# Patient Record
Sex: Female | Born: 1950 | Race: White | Hispanic: No | Marital: Married | Smoking: Former smoker
Health system: Southern US, Community
[De-identification: ages and names within clinical notes are randomized; demographics above are authoritative.]

## PROBLEM LIST (undated history)

## (undated) DIAGNOSIS — Z8489 Family history of other specified conditions: Secondary | ICD-10-CM

## (undated) DIAGNOSIS — C50919 Malignant neoplasm of unspecified site of unspecified female breast: Secondary | ICD-10-CM

## (undated) DIAGNOSIS — E7800 Pure hypercholesterolemia, unspecified: Secondary | ICD-10-CM

## (undated) DIAGNOSIS — I4891 Unspecified atrial fibrillation: Secondary | ICD-10-CM

## (undated) DIAGNOSIS — Z923 Personal history of irradiation: Secondary | ICD-10-CM

## (undated) DIAGNOSIS — Z9012 Acquired absence of left breast and nipple: Secondary | ICD-10-CM

## (undated) DIAGNOSIS — R7303 Prediabetes: Secondary | ICD-10-CM

## (undated) DIAGNOSIS — I10 Essential (primary) hypertension: Secondary | ICD-10-CM

## (undated) DIAGNOSIS — J449 Chronic obstructive pulmonary disease, unspecified: Secondary | ICD-10-CM

## (undated) HISTORY — DX: Pure hypercholesterolemia, unspecified: E78.00

## (undated) HISTORY — DX: Unspecified atrial fibrillation: I48.91

## (undated) HISTORY — DX: Malignant neoplasm of unspecified site of unspecified female breast: C50.919

## (undated) HISTORY — PX: EYE SURGERY: SHX253

## (undated) HISTORY — PX: COLONOSCOPY: SHX174

## (undated) HISTORY — PX: OTHER SURGICAL HISTORY: SHX169

## (undated) HISTORY — DX: Essential (primary) hypertension: I10

## (undated) HISTORY — PX: BREAST LUMPECTOMY: SHX2

---

## 2004-07-24 ENCOUNTER — Ambulatory Visit: Payer: Self-pay | Admitting: Family Medicine

## 2005-08-11 ENCOUNTER — Ambulatory Visit: Payer: Self-pay | Admitting: Family Medicine

## 2006-08-13 ENCOUNTER — Ambulatory Visit: Payer: Self-pay | Admitting: Family Medicine

## 2006-08-13 IMAGING — MG UNKNOWN MG STUDY
1 series · 6 of 6 positions shown · non-contrast
Comparison: none

REASON FOR EXAM: screening mammo
COMMENTS:

[R CC · right · 6 of 6 slices shown]
[im 1/6]
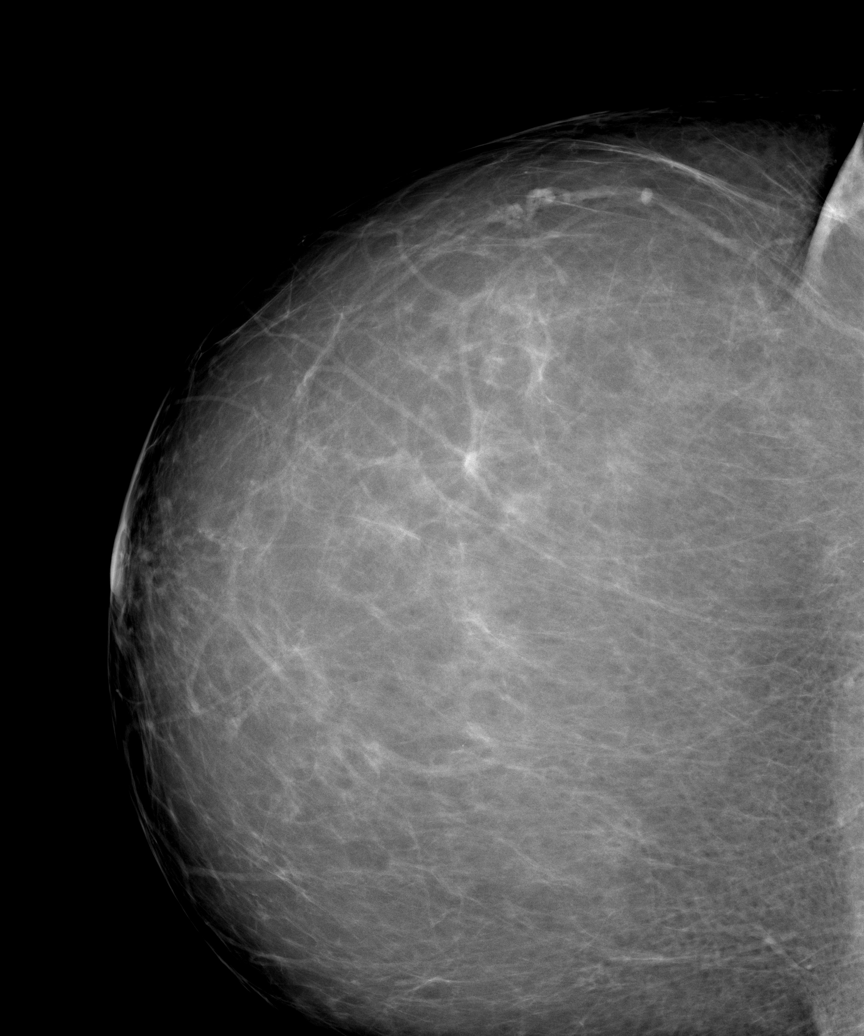
[im 2/6]
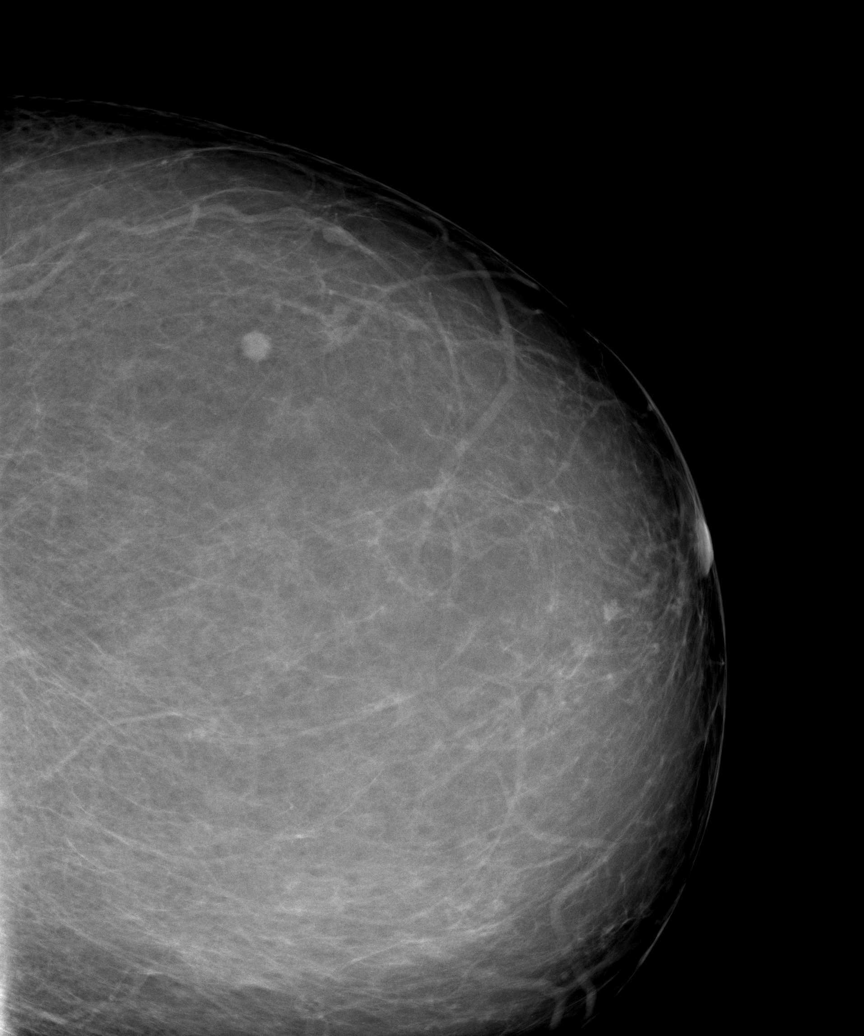
[im 3/6]
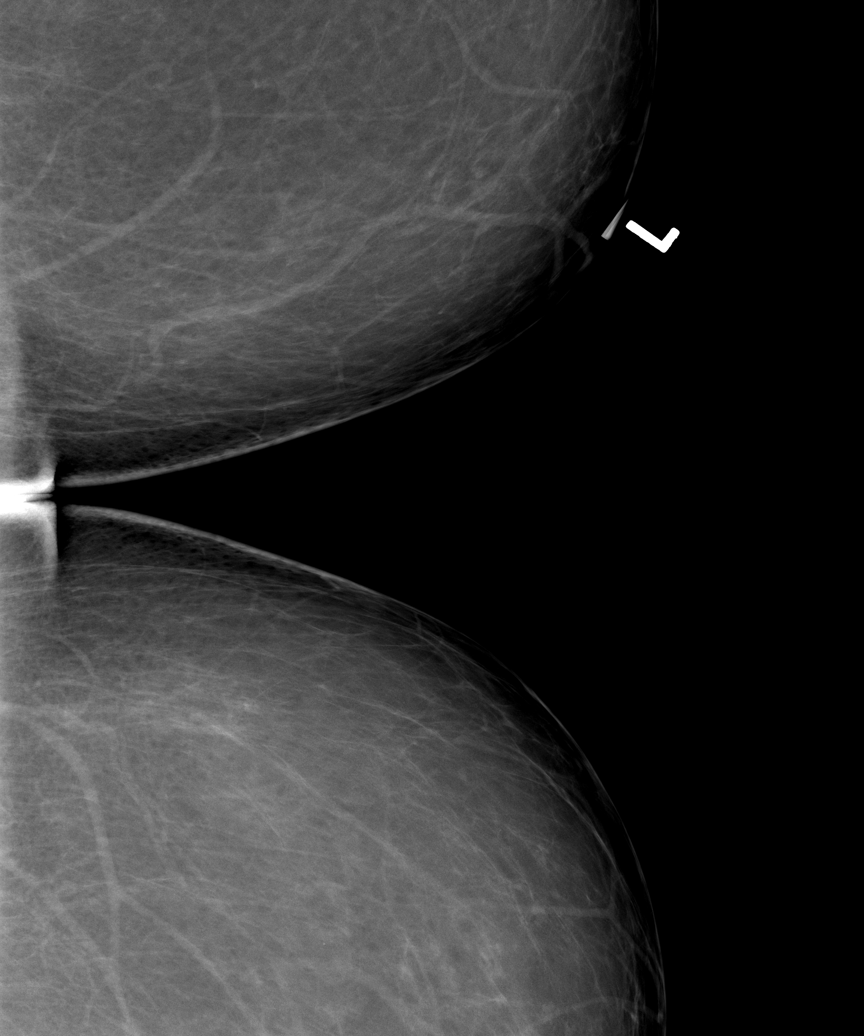
[im 4/6]
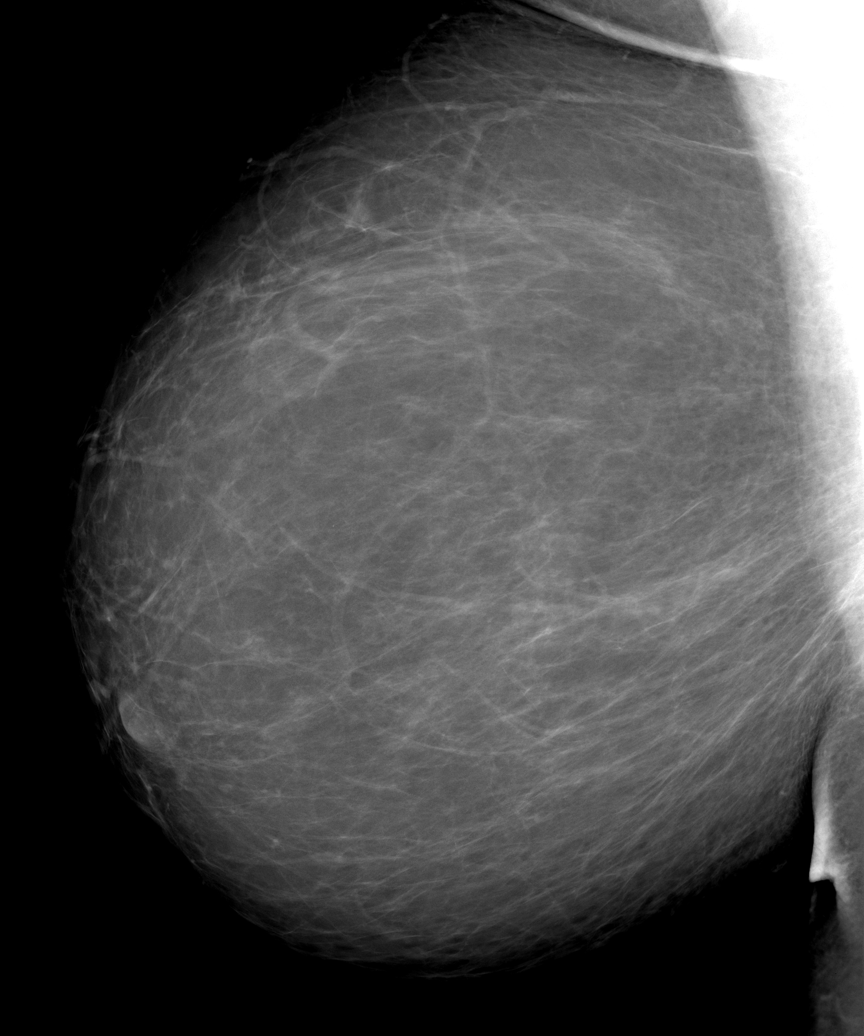
[im 5/6]
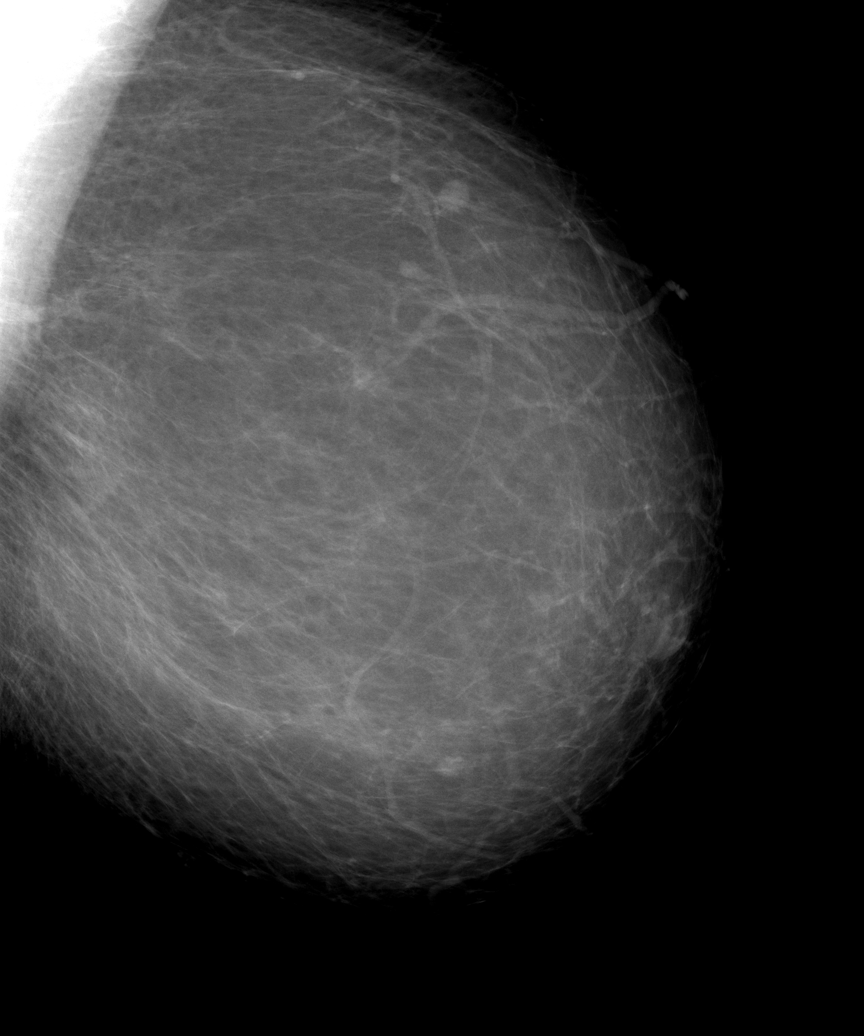
[im 6/6]
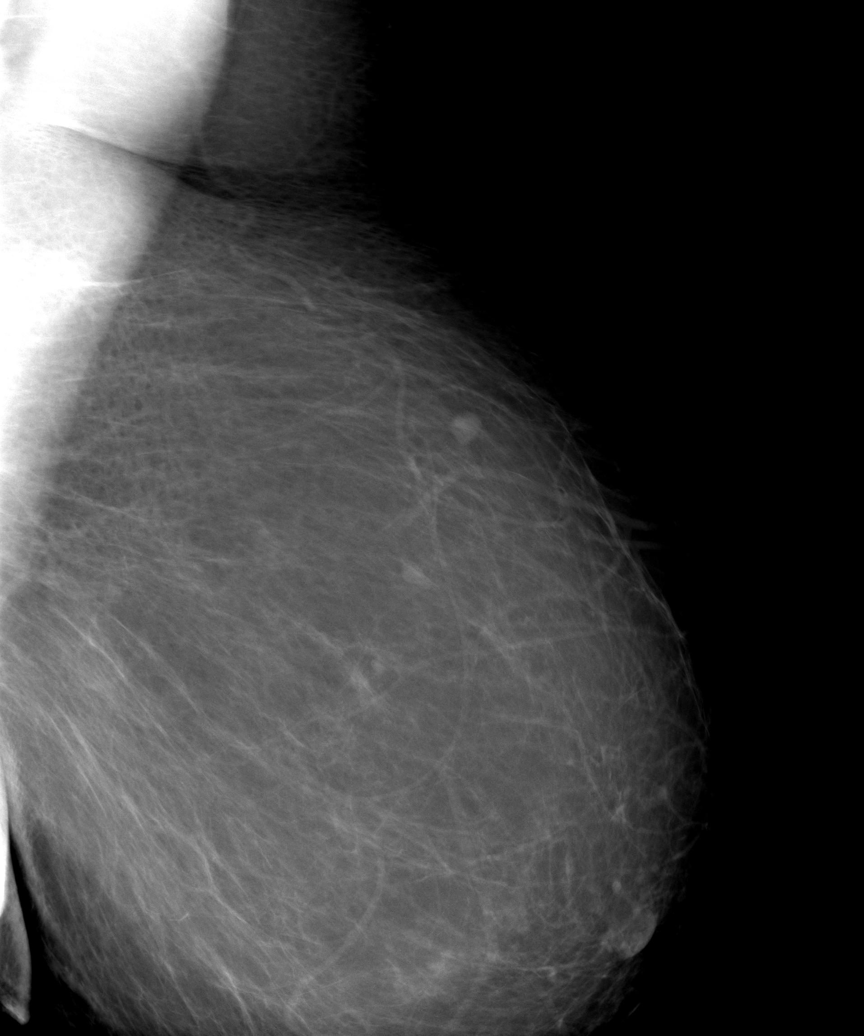

[6 of 6 positions shown; findings below may reference images not displayed]

PROCEDURE:     MAM - MAM DGTL SCREENING MAMMO W/CAD  - [DATE]  [DATE]

RESULT:     Routine images were obtained and compared to the prior film
screen study of [DATE] and [DATE].

Stable nodularity is noted in the LEFT breast.  Otherwise the breasts are
completely replaced with fat.  No suspicious group of microcalcifications or
areas of architecture distortion.  No developing densities are seen.
IMPRESSION: The breasts appear stable. Continued bilateral follow-up is recommended.

BI-RADS 2 - Benign Findings.

A NEGATIVE MAMMOGRAM REPORT DOES NOT PRECLUDE BIOPSY OR OTHER EVALUATION OF
A CLINICALLY PALPABLE OR OTHERWISE SUSPICIOUS MASS OR LESION.  BREAST CANCER
MAY NOT BE DETECTED BY MAMMOGRAPHY IN UP TO 10% OF CASES.

## 2006-10-04 ENCOUNTER — Ambulatory Visit: Payer: Self-pay | Admitting: Gastroenterology

## 2013-02-13 DIAGNOSIS — I8390 Asymptomatic varicose veins of unspecified lower extremity: Secondary | ICD-10-CM | POA: Insufficient documentation

## 2013-02-14 DIAGNOSIS — E7800 Pure hypercholesterolemia, unspecified: Secondary | ICD-10-CM | POA: Insufficient documentation

## 2015-10-15 DIAGNOSIS — I10 Essential (primary) hypertension: Secondary | ICD-10-CM | POA: Insufficient documentation

## 2016-05-26 DIAGNOSIS — E785 Hyperlipidemia, unspecified: Secondary | ICD-10-CM | POA: Insufficient documentation

## 2018-03-09 ENCOUNTER — Ambulatory Visit
Admission: RE | Admit: 2018-03-09 | Discharge: 2018-03-09 | Disposition: A | Discharge status: Home / Self Care | Payer: Medicare Other | Source: Ambulatory Visit | Attending: Family Medicine | Admitting: Family Medicine

## 2018-03-09 ENCOUNTER — Other Ambulatory Visit: Payer: Self-pay | Admitting: Family Medicine

## 2018-03-09 DIAGNOSIS — R52 Pain, unspecified: Secondary | ICD-10-CM

## 2018-03-09 DIAGNOSIS — M47816 Spondylosis without myelopathy or radiculopathy, lumbar region: Secondary | ICD-10-CM | POA: Insufficient documentation

## 2018-03-09 IMAGING — CR DG LUMBAR SPINE 2-3V
3 series · 3 of 3 positions shown · non-contrast
Comparison: None

CLINICAL DATA: RIGHT breast cancer with lumpectomy and radiation
therapy 9 years ago, low back pain worsened over the last couple
months, RIGHT leg pain sometimes, no known injury

EXAM:
LUMBAR SPINE - 2-3 VIEW

[l-spine ap]
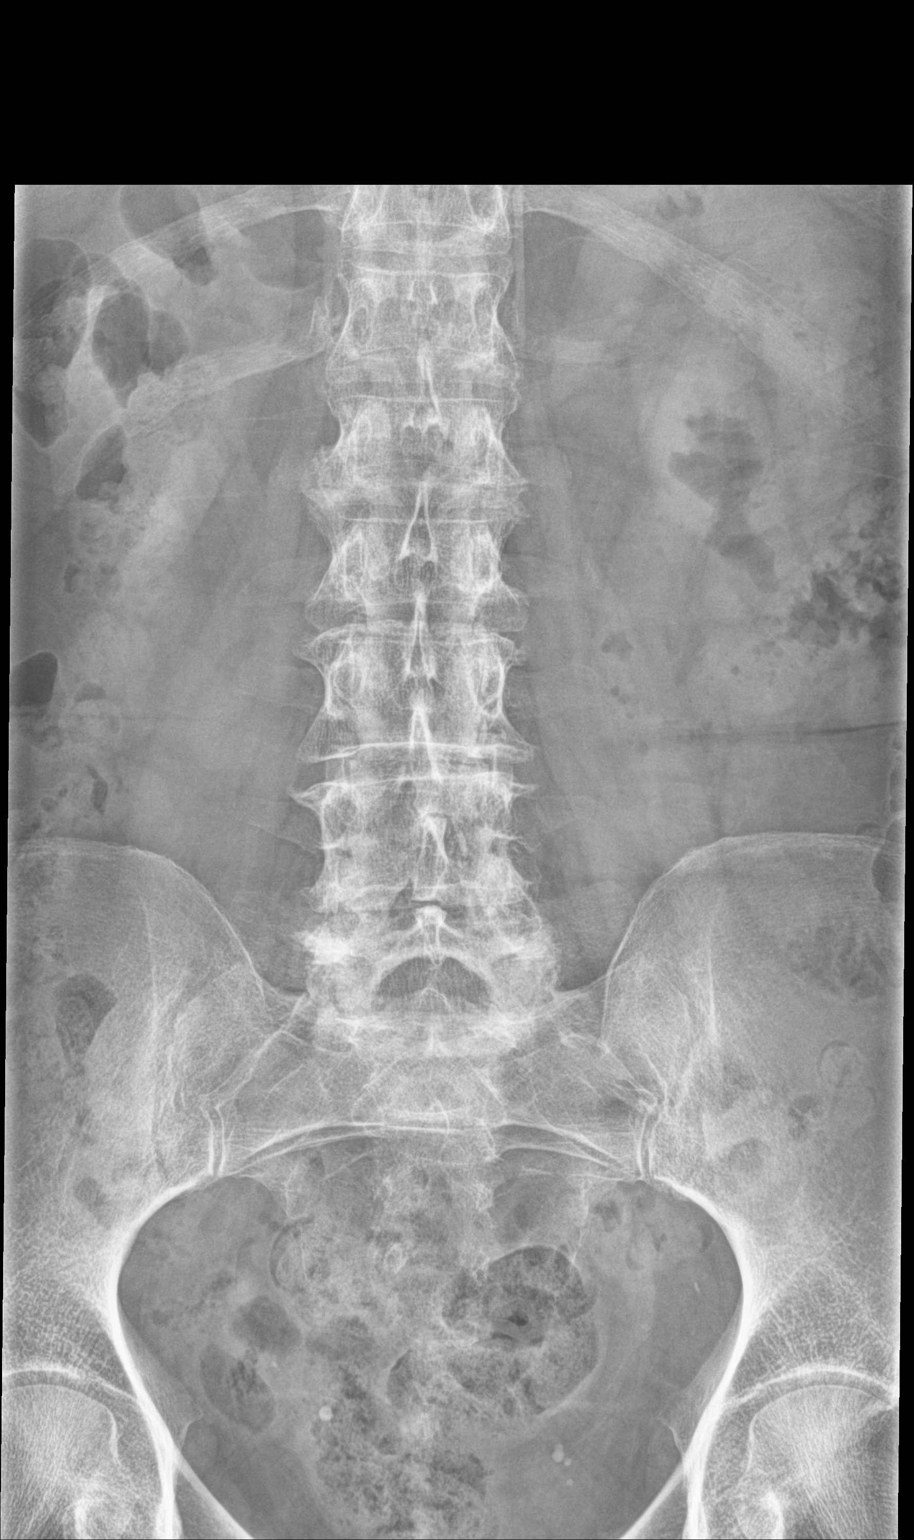

[l-spine lat]
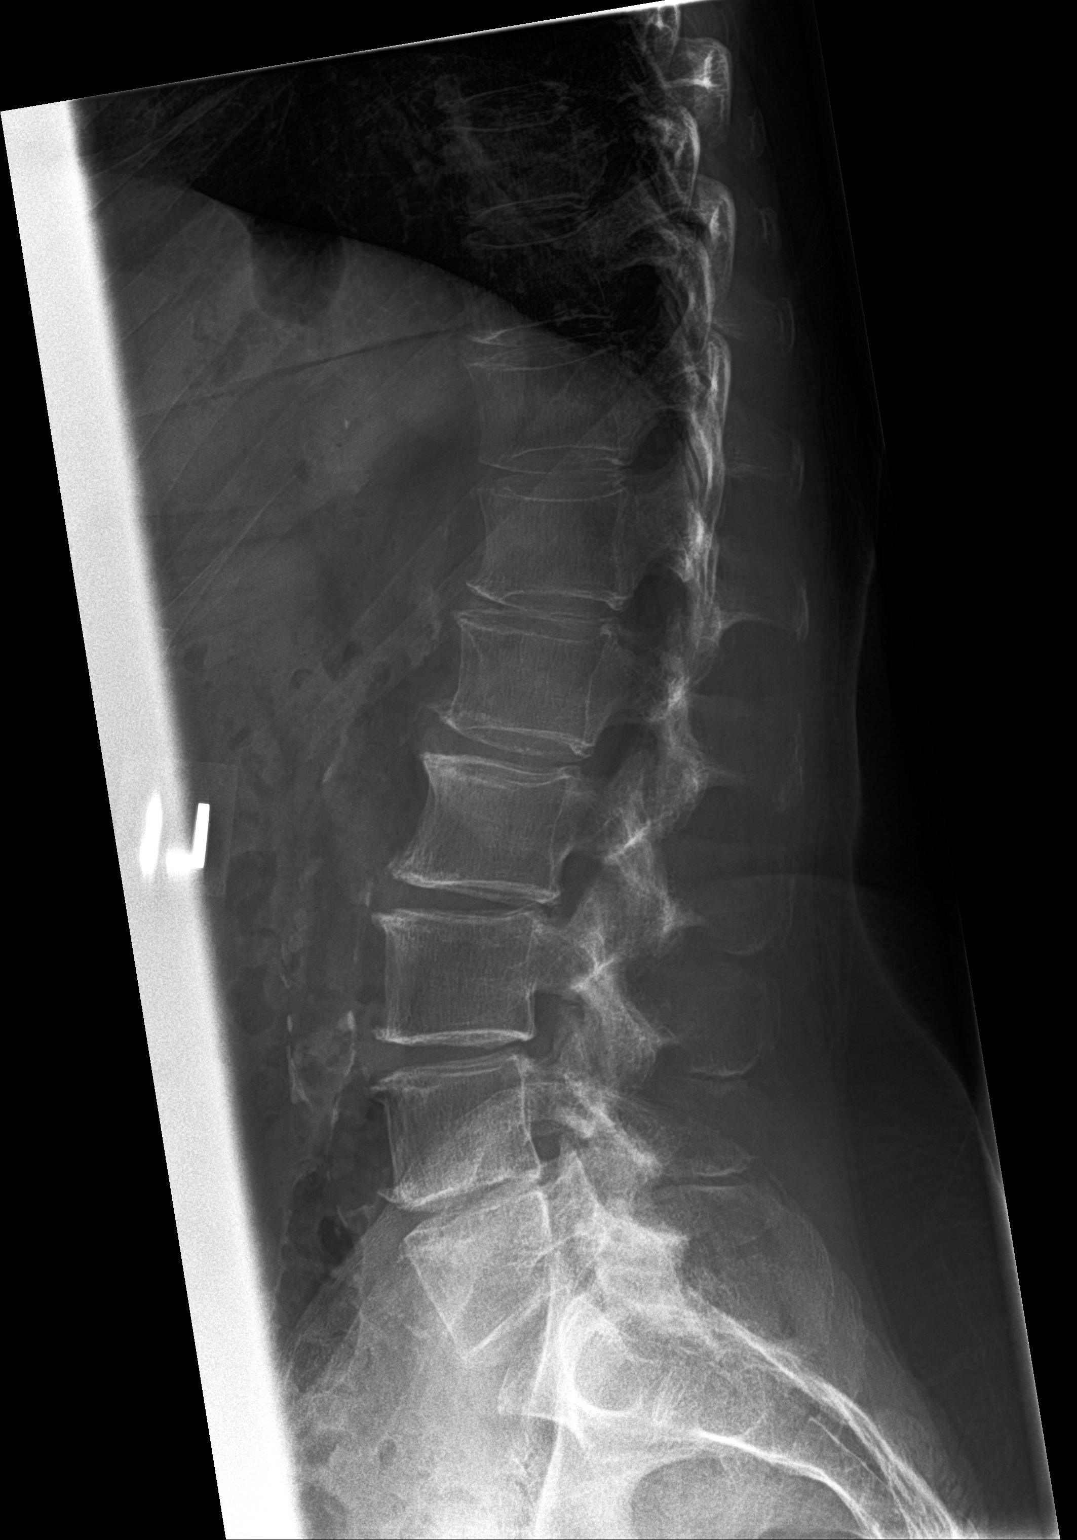

[l-spine spot]
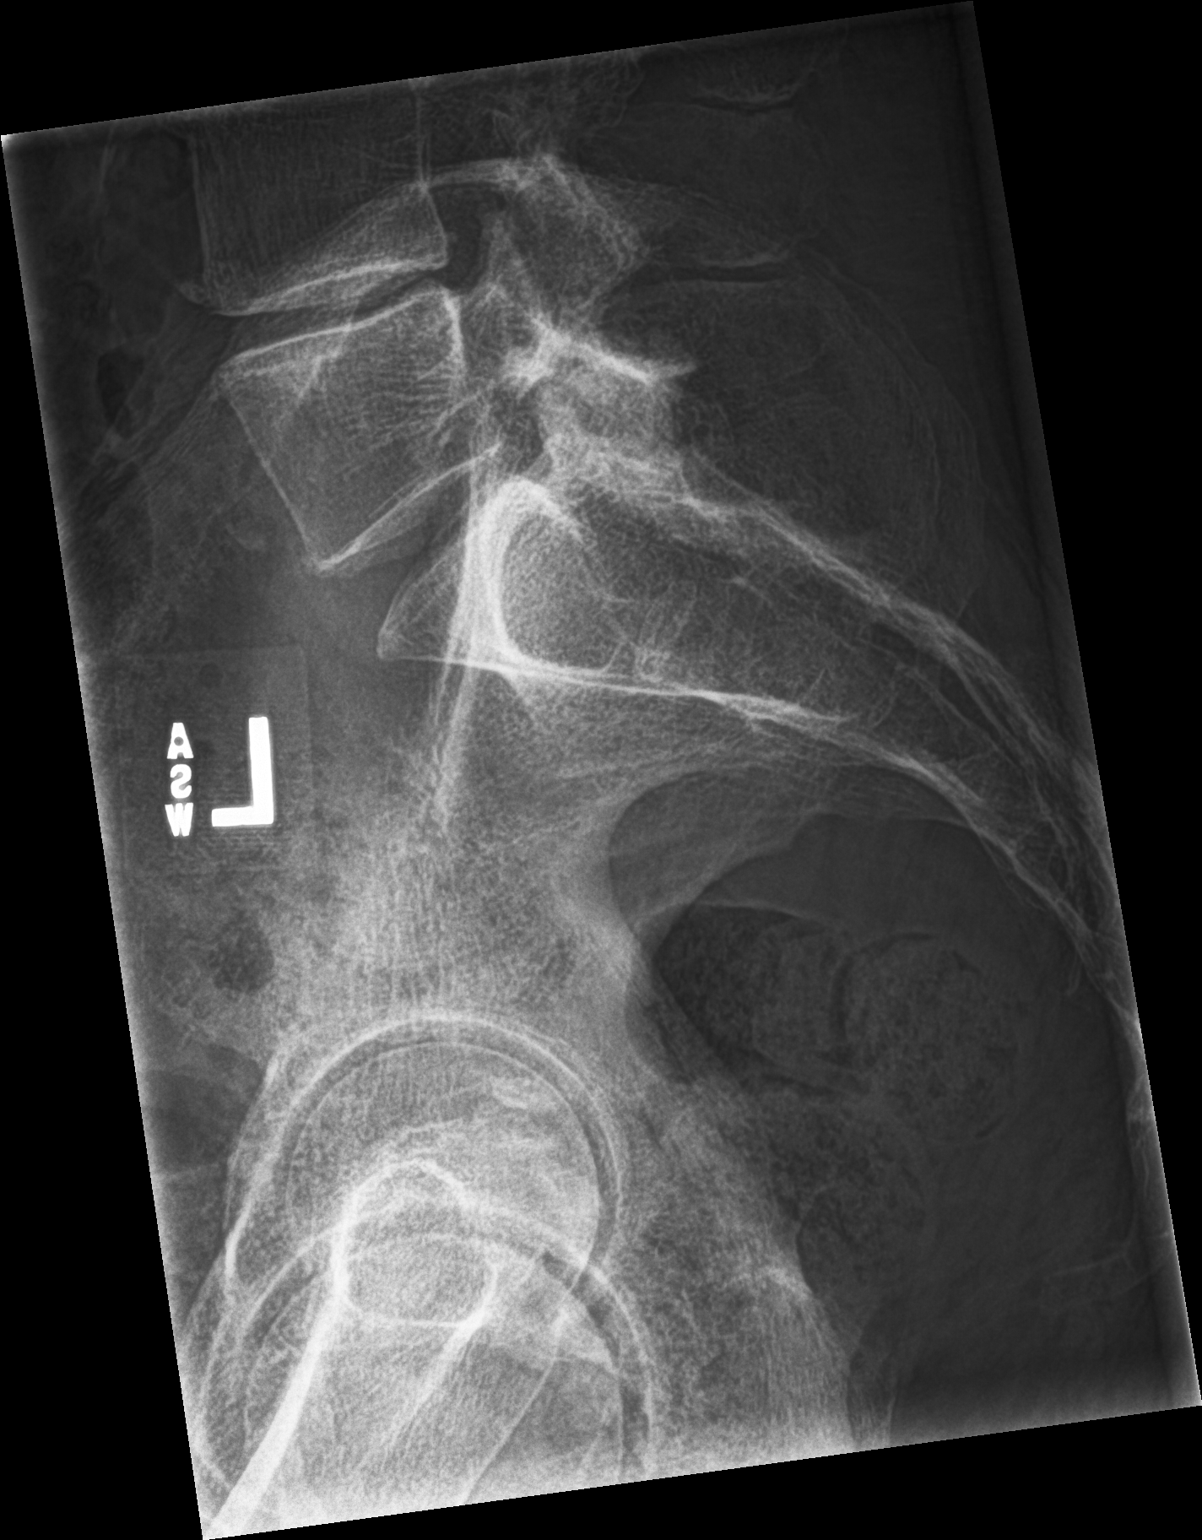

[3 of 3 positions shown; findings below may reference images not displayed]

FINDINGS: Osseous demineralization.

Five non-rib-bearing lumbar vertebra.

Multilevel disc space narrowing and endplate spur formation.

Vertebral body heights maintained without fracture or subluxation.

No bone destruction or spondylolysis.

SI joints preserved.

Atherosclerotic calcifications aorta.
IMPRESSION: Osseous demineralization with multilevel degenerative disc disease
changes of the lumbar spine.

No acute abnormalities.

## 2018-09-12 DIAGNOSIS — M720 Palmar fascial fibromatosis [Dupuytren]: Secondary | ICD-10-CM | POA: Insufficient documentation

## 2019-03-16 DIAGNOSIS — R7303 Prediabetes: Secondary | ICD-10-CM | POA: Insufficient documentation

## 2019-03-21 DIAGNOSIS — M25551 Pain in right hip: Secondary | ICD-10-CM | POA: Insufficient documentation

## 2020-06-07 DIAGNOSIS — E875 Hyperkalemia: Secondary | ICD-10-CM | POA: Insufficient documentation

## 2021-02-06 ENCOUNTER — Ambulatory Visit
Admission: RE | Admit: 2021-02-06 | Discharge: 2021-02-06 | Disposition: A | Discharge status: Home / Self Care | Payer: Medicare PPO | Attending: Family Medicine | Admitting: Family Medicine

## 2021-02-06 ENCOUNTER — Ambulatory Visit
Admission: RE | Admit: 2021-02-06 | Discharge: 2021-02-06 | Disposition: A | Discharge status: Home / Self Care | Payer: Medicare PPO | Source: Ambulatory Visit | Attending: Family Medicine | Admitting: Family Medicine

## 2021-02-06 ENCOUNTER — Other Ambulatory Visit: Payer: Self-pay | Admitting: Family Medicine

## 2021-02-06 ENCOUNTER — Other Ambulatory Visit: Payer: Self-pay

## 2021-02-06 DIAGNOSIS — M5416 Radiculopathy, lumbar region: Secondary | ICD-10-CM | POA: Insufficient documentation

## 2021-02-06 IMAGING — CR DG LUMBAR SPINE 2-3V
3 series · 3 of 3 positions shown · non-contrast
Comparison: None.

CLINICAL DATA: Lumbar radiculopathy.

EXAM:
LUMBAR SPINE - 2-3 VIEW

[l-spine ap]
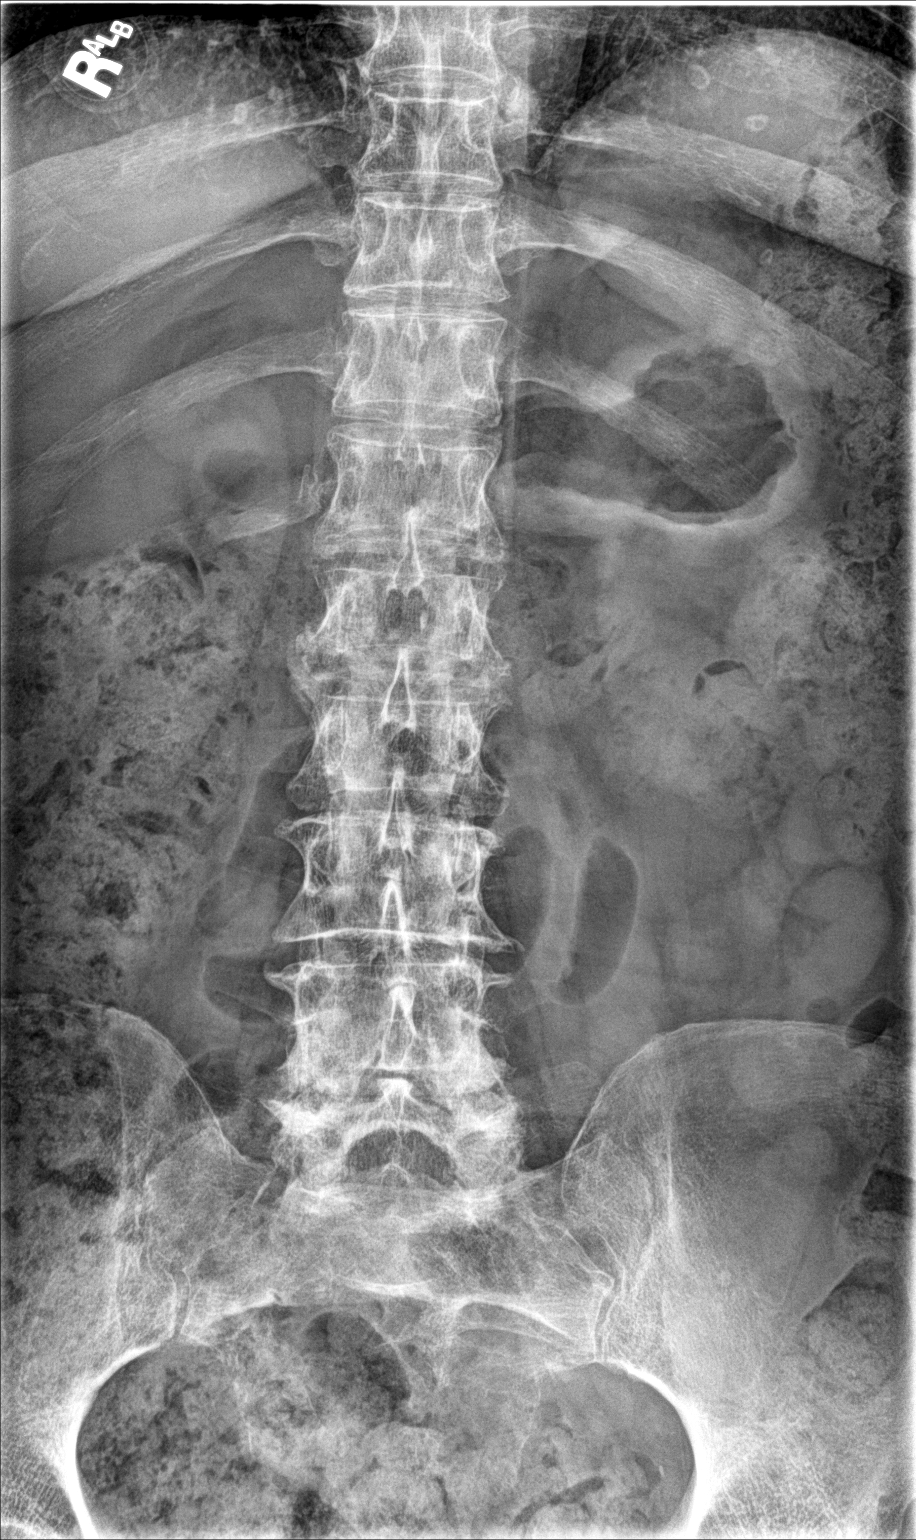

[l-spine lat]
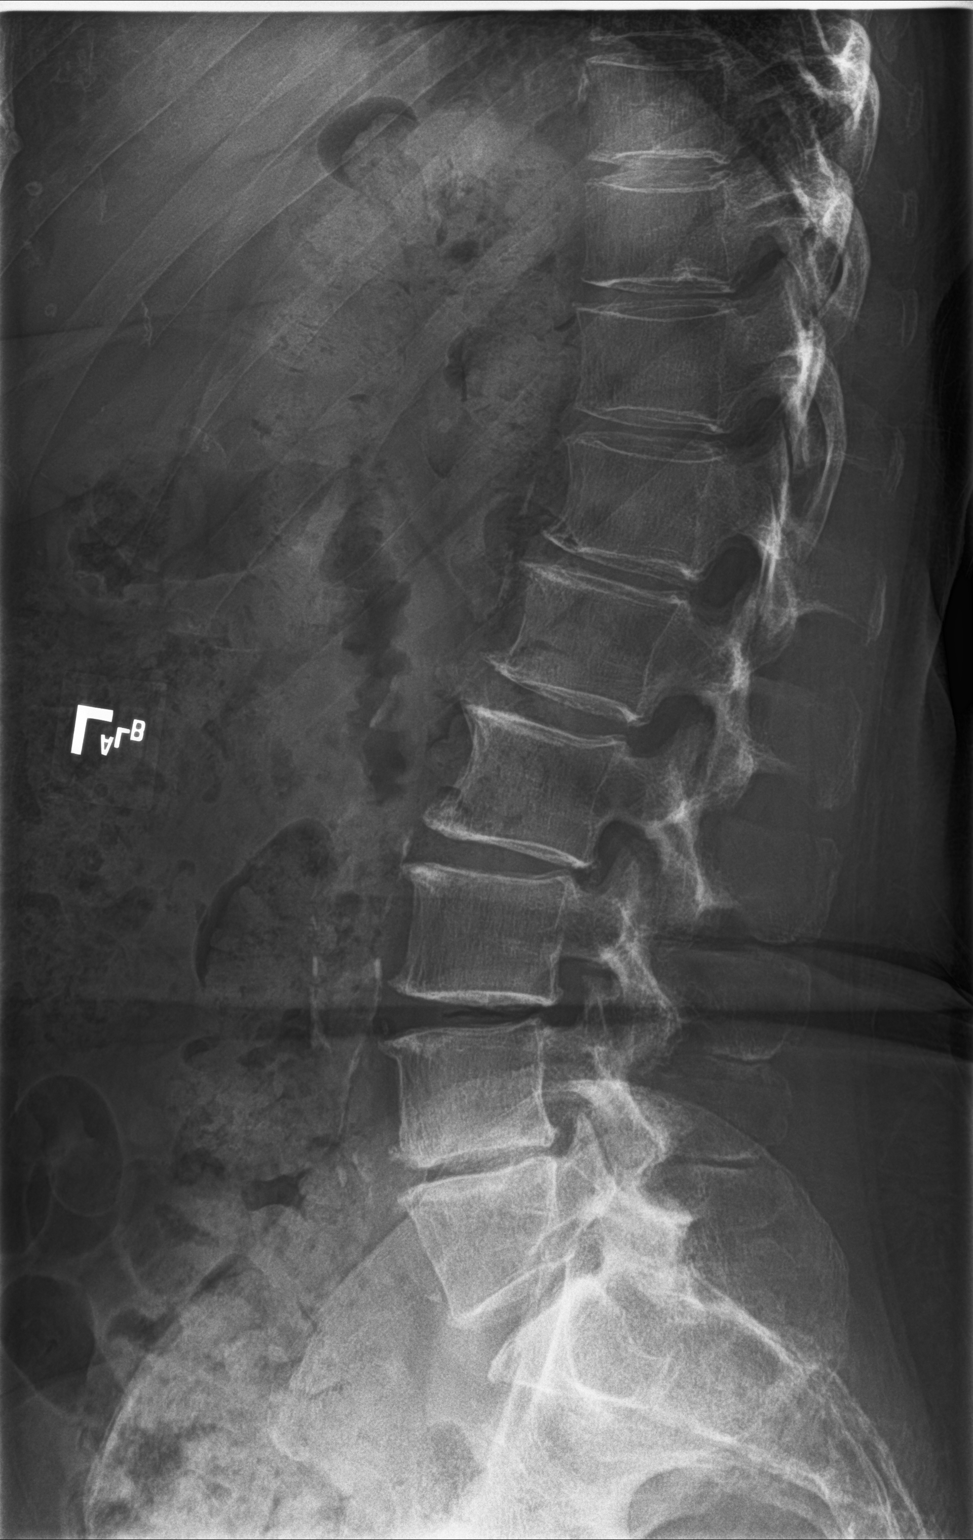

[l-spine spot]
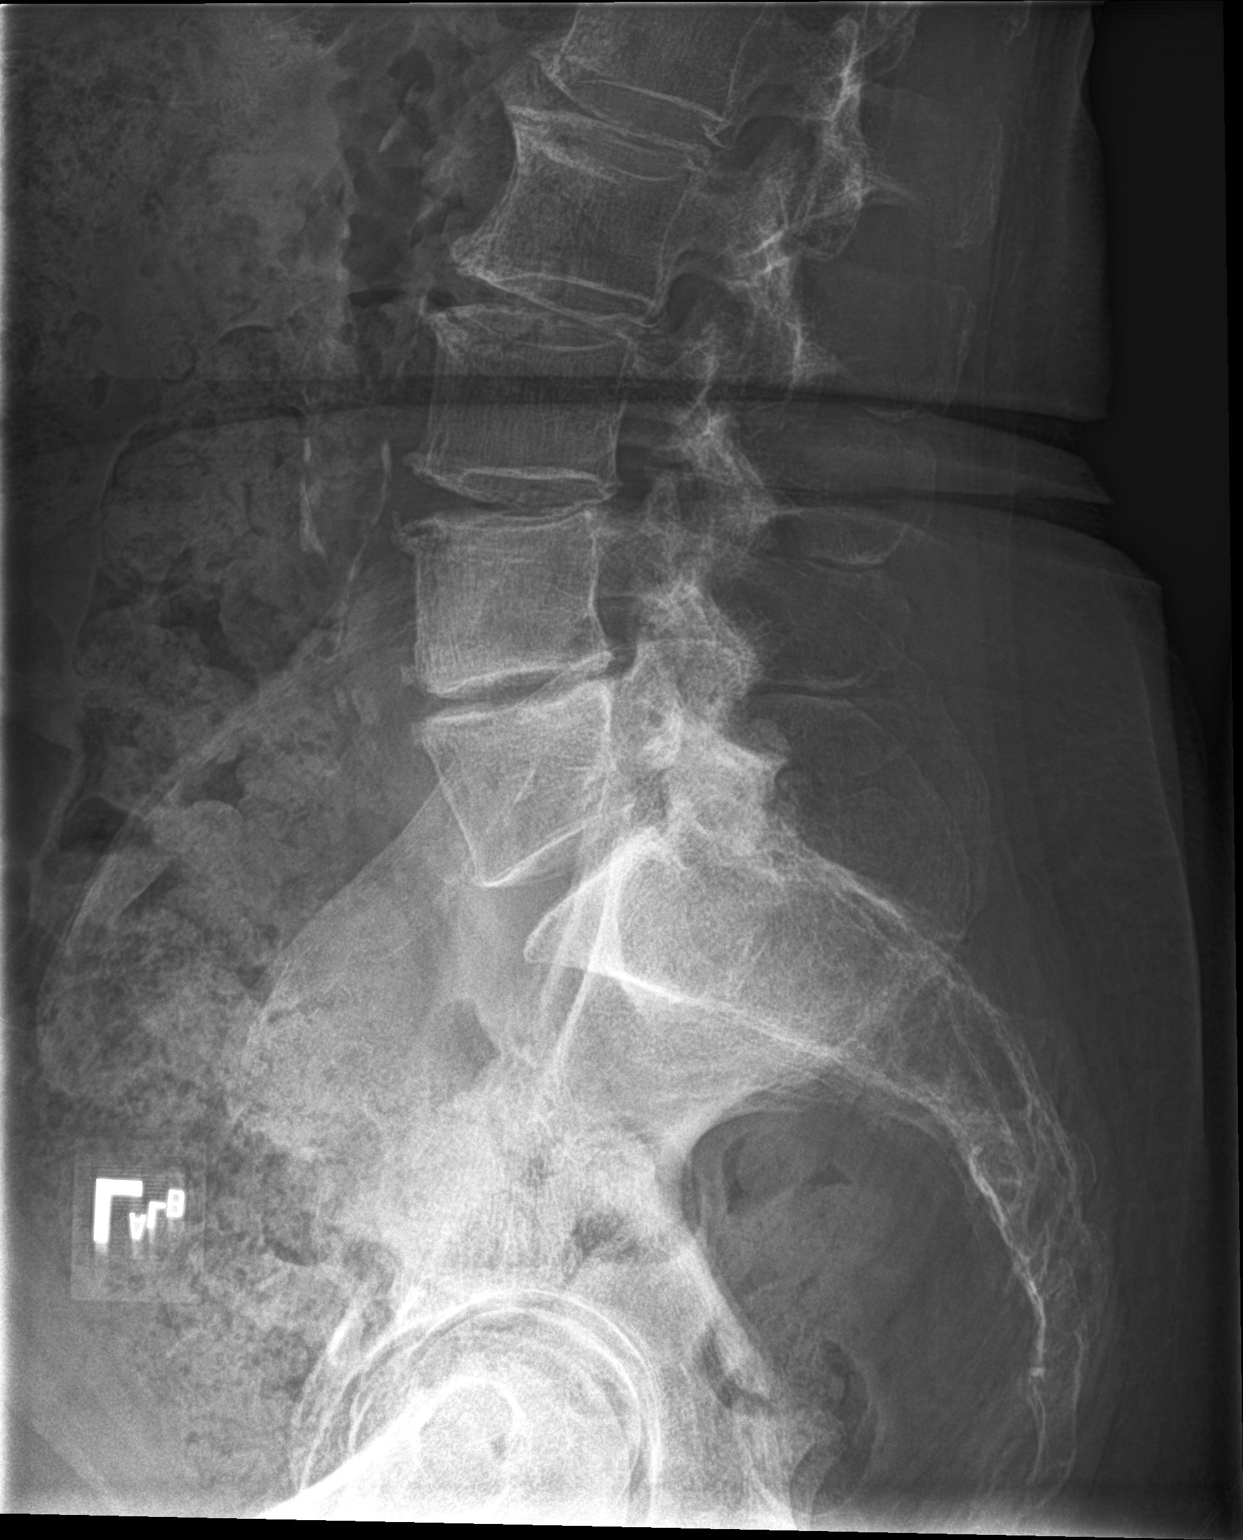

[3 of 3 positions shown; findings below may reference images not displayed]

FINDINGS: Moderate to severe fecal loading throughout the length of the colon.

Trace retrolisthesis of L3 versus L4. Trace retrolisthesis of L2
versus L3. No other malalignment. No fractures. Multilevel
degenerative disc disease. Lower lumbar facet degenerative changes.
Calcified atherosclerosis in the abdominal aorta.
IMPRESSION: 1. Trace retrolisthesis of L2 versus L3 and L3 versus L4, stable.
Multilevel degenerative disc disease. Lower lumbar facet
degenerative changes.
2. Moderate to severe fecal loading throughout the length of the
colon.
3. Calcified atherosclerosis in the abdominal aorta.

## 2021-02-17 NOTE — Patient Instructions (Addendum)
Access Code: [email or web address] URL: [email or web address] Date: 02/19/2021 Prepared by: Roxana Hires  Exercises Seated Piriformis Stretch (Mirrored) - 3 x daily - 7 x weekly - 3 reps - 45s hold Supine Piriformis Stretch with Foot on Ground - 3 x daily - 7 x weekly - 3 reps - 45s hold Seated Figure 4 Piriformis Stretch (Mirrored) - 3 x daily - 7 x weekly - 3 reps - 45s hold Standing Lumbar Extension - 3 x daily - 7 x weekly - 2 sets - 10 reps - 5s hold

## 2021-02-19 ENCOUNTER — Ambulatory Visit: Payer: Medicare PPO | Attending: Family Medicine

## 2021-02-19 ENCOUNTER — Other Ambulatory Visit: Payer: Self-pay

## 2021-02-19 DIAGNOSIS — M6281 Muscle weakness (generalized): Secondary | ICD-10-CM | POA: Insufficient documentation

## 2021-02-19 DIAGNOSIS — M5441 Lumbago with sciatica, right side: Secondary | ICD-10-CM | POA: Diagnosis present

## 2021-02-19 DIAGNOSIS — G8929 Other chronic pain: Secondary | ICD-10-CM | POA: Diagnosis present

## 2021-02-19 DIAGNOSIS — M25551 Pain in right hip: Secondary | ICD-10-CM | POA: Diagnosis present

## 2021-02-19 NOTE — Therapy (Signed)
[facility] [address] [place], [place], [address] Phone: [phone]   Fax:  [phone]  Physical Therapy Evaluation  Patient Details  Name: Amber Blevins MRN: [id number] Date of Birth: 08/31/1951 Referring Provider (PT): Dr. Astrid Divine   Encounter Date: 02/19/2021   PT End of Session - 02/19/21 1122    Visit Number 1    Number of Visits 17    Date for PT Re-Evaluation 04/16/21    Authorization Type eval: 02/19/21    PT Start Time 0845    PT Stop Time 0935    PT Time Calculation (min) 50 min    Activity Tolerance Patient tolerated treatment well    Behavior During Therapy [facility] for tasks assessed/performed           History reviewed. No pertinent past medical history.  History reviewed. No pertinent surgical history.  There were no vitals filed for this visit.    Subjective Assessment - 02/19/21 1117    Subjective Low back/R posterior hip pain with radicular symptoms down RLE    Pertinent History Pt reports intermittent low back pain for multiple years. Normally occurs with excess activity such as gardening. Pt reports some numbness and tingling in her sacral area which started in February 2022. She has not had saddle anesthesia or bowel or bladder incontinence. She is normally very active but due to COVID restrictions has been less active and thinks this has contributed. She states she was diagnosed with piriformis syndrome by her MD and was put on a 7 day prednisone taper as well as gabapentin. She states the prednisone did not help. She has gradually increased her gabapentin and does report that this has been helpful. She currently gets radicular symptoms down her RLE. She had plain film radiographs of the lumbar spine which showed moderate to severe fecal loading throughout the length of the colon. Trace retrolisthesis of L3 versus L4. Trace retrolisthesis of L2 versus L3. No other malalignment. No fractures. Multilevel  degenerative disc disease. Lower lumbar facet degenerative changes. Calcified atherosclerosis in the abdominal aorta. To help with the fecal burden she initially tried prune juice without relief so she used magnesium citrate which resolved the constipation. No improvement in the back pain once constipation resolved. She does have a history of breast cancer 12 years ago with lumpectomy and radiation. Released by oncology and now just has regular mammograms. She has a bone density test scheduled this year due to a history of estrogen blockers after her breast cancer.    How long can you sit comfortably? 1-2 hours    How long can you stand comfortably? 1-2 hours    Diagnostic tests See history    Patient Stated Goals Pt would like to be able to perform sit to stand and walk 10,000 steps without pain. Carry groceries without pain;    Currently in Pain? No/denies    Pain Score 0-No pain   Worst: 8/10, Best: 0/10   Pain Location Hip    Pain Orientation Right;Posterior    Pain Descriptors / Indicators Heaviness;Sharp    Pain Type Chronic pain    Pain Radiating Towards down RLE to lateral right calf    Pain Onset More than a month ago    Pain Frequency Intermittent    Aggravating Factors  extended walking, extended sitting, standing up from chair    Pain Relieving Factors Alternating ice/heat, massage, lumbar support, stretching (lateral flexion, lumbar extension, piriformis stretch)  [facility] PT Assessment - 02/19/21 1119      Assessment   Medical Diagnosis Lumbar radiculopathy    Referring Provider (PT) Dr. Astrid Divine    Onset Date/Surgical Date 11/25/20   Acute on chronic   Hand Dominance Right    Next MD Visit None scheduled    Prior Therapy None for this issue      Precautions   Precautions None      Restrictions   Weight Bearing Restrictions No      Balance Screen   Has the patient fallen in the past 6 months No                SUBJECTIVE Chief complaint:    History: Pt reports intermittent low back pain for multiple years. Normally occurs with excess activity such as gardening. Pt reports some numbness and tingling in her sacral area which started in February 2022. She has not had saddle anesthesia or bowel or bladder incontinence. She is normally very active but due to COVID restrictions has been less active and thinks this has contributed. She states she was diagnosed with piriformis syndrome by her MD and was put on a 7 day prednisone taper as well as gabapentin. She states the prednisone did not help. She has gradually increased her gabapentin and does report that this has been helpful. The currently gets radicular symptoms down her RLE. She had plain film radiographs of the lumbar spine which showed moderate to severe fecal loading throughout the length of the colon. Trace retrolisthesis of L3 versus L4. Trace retrolisthesis of L2 versus L3. No other malalignment. No fractures. Multilevel degenerative disc disease. Lower lumbar facet degenerative changes. Calcified atherosclerosis in the abdominal aorta. To help with the fecal burden she initially tried prune juice without relief so she used magnesium citrate which resolved the constipation. No improvement in the back pain once constipation resolved. She does have a history of breast cancer 12 years ago with lumpectomy and radiation. Released by oncology and now just has regular mammograms. She has a bone density test scheduled this year due to a history of estrogen blockers after her breast cancer.  Referring Dx: Low back pain Referring Provider: Dr. Gayland Curry Pain location: Sacral into R buttock and down RLE Pain: Present 0/10, Best 0/10, Worst 8/10 Pain quality: sharp, deep, heavy Radiating pain: Yes, down RLE to lateral right calf Numbness/Tingling: Yes in sacral area (denies N/T in groin or rectal area) 24 hour pain behavior: better in the AM Aggravating factors: extended walking, extended  sitting, standing up from chair  Easing factors: Alternating ice/heat, massage, lumbar support, stretching (lateral flexion, lumbar extension, piriformis stretch) How long can you sit: 1-2 hours How long can you stand:1-2 hours History of low back injury, pain, surgery, or therapy: No, none for low back. Has had therapy for neck pain Follow-up appointment with MD: No Dominant hand: right Imaging: Yes, see history Falls in the last 6 months: No  Occupational demands: Retired Office manager: walking, gardening, music (used to like dancing), travel Goals: Pt would like to be able to perform sit to stand and walk 10,000 steps without pain. Carry groceries without pain;  Red flags (bowel/bladder changes, saddle paresthesia, personal history of cancer, chills/fever, night sweats, unrelenting pain, first onset of insidious LBP <19 y/o) History of breast cancer 12 years ago. Otherwise red flags  Negative    OBJECTIVE  Mental Status Patient is oriented to person, place and time.  Recent memory is intact.  Remote memory is  intact.  Attention span and concentration are intact.  Expressive speech is intact.  Patient's fund of knowledge is within normal limits for educational level.  SENSATION: Grossly intact to light touch bilateral LEs as determined by testing dermatomes L2-S2 Proprioception and hot/cold testing deferred on this date   MUSCULOSKELETAL: Tremor: None Bulk: Normal Tone: Normal No visible step-off along spinal column  Posture Lumbar lordosis: Decreased normal lumbar lordosis Iliac crest height: equal bilaterally Lumbar lateral shift: negative  Gait Decreased self-selected gait speed with R antalgic gait.    Palpation Very mild pain with palpation to R posterior hip over external rotators but no reproduction of RLE radicular pain   Strength (out of 5) R/L 5*/5* Hip flexion 4+*/4+* Hip ER 4+*/4+* Hip IR 4-*/4- Hip abduction 4-/4- Hip adduction 4-/4- Hip extension 5/5  Knee extension 4/4+ Knee flexion 5/5 Ankle dorsiflexion Active bilateral ankle plantarflexion *Indicates pain   AROM (degrees) R/L (all movements include overpressure unless otherwise stated) Lumbar forward flexion (65): 50% loss*, pain down to calf Lumbar extension (30): 25% loss*, pain down to mid thigh but not down to calf Lumbar lateral flexion (25): R: 25% loss* L: 25% loss*  Thoracic and Lumbar rotation (30 degrees):  R: 50% loss* L: 50% loss * *Indicates pain  Repeated Movements Peripheralization with repeated lumbar flexion    Muscle Length Hamstrings: R: 90 degrees L: 90 degrees, no pain on either side   Passive Accessory Intervertebral Motion (PAIVM) Pt reports reproduction of R buttock and lateral R hip pain with CPA and R UPA L4-5. Otherwise no pain in higher lumbar levels but generally hypomobile throughout lumbar spine;    SPECIAL TESTS Lumbar Radiculopathy and Discogenic: Centralization and Peripheralization (SN 92, -LR 0.12): Positive for peripheralization with lumbar flexion Slump (SN 83, -LR 0.32): R: Positive L: Negative SLR (SN 92, -LR 0.29): R: Negative L:  Negative Crossed SLR (SP 90): R: Negative L: Negative  Facet Joint: Extension-Rotation (SN 100, -LR 0.0): R: Positive L: Negative  Lumbar Spinal Stenosis: Lumbar quadrant (SN 70): R: Positive L: Negative  Hip: FABER (SN 81): R: Positive L: Negative FADIR (SN 94): R: Positive L: Negative Hip scour (SN 50): R: Negative L: Negative  SIJ:  Thigh Thrust (SN 88, -LR 0.18) : R: Not examined L: Not examined  Piriformis Syndrome: FAIR Test (SN 88, SP 83): R: Positive for pulling in R posterior hip L: Negative             Objective measurements completed on examination: See above findings.                 PT Short Term Goals - 02/19/21 1129      PT SHORT TERM GOAL #1   Title Pt will be independent with HEP in order to improve strength and decrease back/hip pain in order to  improve pain-free function at home.    Time 4    Period Weeks    Status New    Target Date 03/19/21             PT Long Term Goals - 02/19/21 1130      PT LONG TERM GOAL #1   Title Pt will decrease mODI score by at least 13 points in order demonstrate clinically significant reduction in back pain/disability.    Baseline 02/19/21: 36%    Time 8    Period Weeks    Status New    Target Date 04/16/21      PT LONG TERM GOAL #  2   Title Pt will improve FOTO to at least 59 in order to demonstrate significant improvement in function related to her R low back/posterior hip pain    Baseline 02/19/21: 37    Time 8    Period Weeks    Status New    Target Date 04/16/21      PT LONG TERM GOAL #3   Title Pt will decrease worst back/R posterior hip pain as reported on NPRS by at least 3 points in order to demonstrate clinically significant reduction in back pain.    Baseline 02/19/21: 8/10    Time 8    Period Weeks    Status New    Target Date 04/16/21      PT LONG TERM GOAL #4   Title Pt will increase bilateral hip abduction and extension strength of by at least 1/2 MMT grade in order to demonstrate improvement in strength and function.    Baseline 02/19/21: 4-/5 bilaterally for both abduction and extension    Time 8    Period Weeks    Status New    Target Date 04/16/21                  Plan - 02/19/21 1127    Clinical Impression Statement Pt is a pleasant 70 year-old female referred for low back pain/R posterior hip pain which radiates down her RLE. PT examination reveals painful and limited lumbar AROM. Pt reports reproduction of R buttock and lateral R hip pain with central and R unilteral passive accessory motion testing of spine, L4-5. Otherwise no pain in higher lumbar levels but generally hypomobile throughout lumbar spine. Positive R slump test. Very mild palpation to R posterior hip over external rotators but no reproduction of RLE radicular pain. She does have some  reproduction of pain with passive range of motion testing of R hip into internal and external range of motion. Weakness noted in bilateral hip abduction, adduction, and extension. Pt will benefit from skilled PT services to address deficits and return to pain-free function at home.    Personal Factors and Comorbidities Age;Time since onset of injury/illness/exacerbation;Comorbidity 2    Comorbidities DDD, breast CA    Examination-Activity Limitations Transfers;Locomotion Level    Examination-Participation Restrictions Community Activity;Yard Work    Stability/Clinical Decision Making Stable/Uncomplicated    Designer, jewellery Low    Rehab Potential Good    PT Frequency 2x / week    PT Duration 8 weeks    PT Treatment/Interventions ADLs/Self Care Home Management;Aquatic Therapy;Biofeedback;Canalith Repostioning;Cryotherapy;Electrical Stimulation;Iontophoresis 4mg /ml Dexamethasone;Moist Heat;Traction;Ultrasound;Gait training;Functional mobility training;Therapeutic activities;Therapeutic exercise;Balance training;Neuromuscular re-education;Manual techniques;Patient/family education;Passive range of motion;Dry needling;Vestibular;Spinal Manipulations;Joint Manipulations    PT Next Visit Plan Initiate mobilzations of lower lumbar spine, STM to R posterior hip, review HEP, consider TDN    PT Home Exercise Plan Access Code: [email or web address]    Consulted and Agree with Plan of Care Patient           Patient will benefit from skilled therapeutic intervention in order to improve the following deficits and impairments:  Abnormal gait,Pain,Decreased strength  Visit Diagnosis: Chronic right-sided low back pain with right-sided sciatica - Plan: PT plan of care cert/re-cert  Pain in right hip - Plan: PT plan of care cert/re-cert  Muscle weakness (generalized) - Plan: PT plan of care cert/re-cert     Problem List There are no problems to display for this patient.  Amber Blevins PT, DPT, GCS   Amber Blevins 02/19/2021, 6:01 PM  [place]  [facility] [address] [place], [place], [address] Phone: [phone]   Fax:  [phone]  Name: Amber Blevins MRN: [id number] Date of Birth: June 26, 1951

## 2021-02-25 NOTE — Patient Instructions (Addendum)
Access Code: [email or web address] URL: [email or web address] Date: 02/27/2021 Prepared by: Roxana Hires  Exercises Seated Piriformis Stretch (Mirrored) - 3 x daily - 7 x weekly - 3 reps - 45s hold Supine Piriformis Stretch with Foot on Ground - 3 x daily - 7 x weekly - 3 reps - 45s hold Seated Figure 4 Piriformis Stretch (Mirrored) - 3 x daily - 7 x weekly - 3 reps - 45s hold Standing Lumbar Extension - 3 x daily - 7 x weekly - 2 sets - 10 reps - 5s hold Supine Posterior Pelvic Tilt - 2 x daily - 7 x weekly - 2 sets - 10 reps - 5s hold Supine March with Posterior Pelvic Tilt - 2 x daily - 7 x weekly - 2 sets - 10 reps - 5s hold

## 2021-02-27 ENCOUNTER — Other Ambulatory Visit: Payer: Self-pay

## 2021-02-27 ENCOUNTER — Ambulatory Visit: Payer: Medicare PPO | Attending: Family Medicine

## 2021-02-27 DIAGNOSIS — M5441 Lumbago with sciatica, right side: Secondary | ICD-10-CM | POA: Diagnosis present

## 2021-02-27 DIAGNOSIS — G8929 Other chronic pain: Secondary | ICD-10-CM | POA: Diagnosis present

## 2021-02-27 DIAGNOSIS — M6281 Muscle weakness (generalized): Secondary | ICD-10-CM | POA: Insufficient documentation

## 2021-02-27 DIAGNOSIS — M25551 Pain in right hip: Secondary | ICD-10-CM | POA: Insufficient documentation

## 2021-02-27 NOTE — Therapy (Signed)
[facility] [address] [place], [place], [address] Phone: [phone]   Fax:  [phone]  Physical Therapy Treatment  Patient Details  Name: Amber Blevins MRN: [id number] Date of Birth: 10-01-1950 Referring Provider (PT): Dr. Astrid Divine   Encounter Date: 02/27/2021   PT End of Session - 02/27/21 1616    Visit Number 2    Number of Visits 17    Date for PT Re-Evaluation 04/16/21    Authorization Type eval: 02/19/21    PT Start Time 1400    PT Stop Time 1445    PT Time Calculation (min) 45 min    Activity Tolerance Patient tolerated treatment well    Behavior During Therapy [facility] for tasks assessed/performed           History reviewed. No pertinent past medical history.  History reviewed. No pertinent surgical history.  There were no vitals filed for this visit.   Subjective Assessment - 02/27/21 1348    Subjective Pt reports that she is doing well today. She denies any resting pain upon arrival currently. Overall her pain is improving and she has been performing her HEP without issue.  No specific questions or concerns currently.    Pertinent History Pt reports intermittent low back pain for multiple years. Normally occurs with excess activity such as gardening. Pt reports some numbness and tingling in her sacral area which started in February 2022. She has not had saddle anesthesia or bowel or bladder incontinence. She is normally very active but due to COVID restrictions has been less active and thinks this has contributed. She states she was diagnosed with piriformis syndrome by her MD and was put on a 7 day prednisone taper as well as gabapentin. She states the prednisone did not help. She has gradually increased her gabapentin and does report that this has been helpful. She currently gets radicular symptoms down her RLE. She had plain film radiographs of the lumbar spine which showed moderate to severe fecal loading throughout  the length of the colon. Trace retrolisthesis of L3 versus L4. Trace retrolisthesis of L2 versus L3. No other malalignment. No fractures. Multilevel degenerative disc disease. Lower lumbar facet degenerative changes. Calcified atherosclerosis in the abdominal aorta. To help with the fecal burden she initially tried prune juice without relief so she used magnesium citrate which resolved the constipation. No improvement in the back pain once constipation resolved. She does have a history of breast cancer 12 years ago with lumpectomy and radiation. Released by oncology and now just has regular mammograms. She has a bone density test scheduled this year due to a history of estrogen blockers after her breast cancer.    How long can you sit comfortably? 1-2 hours    How long can you stand comfortably? 1-2 hours    Diagnostic tests See history    Patient Stated Goals Pt would like to be able to perform sit to stand and walk 10,000 steps without pain. Carry groceries without pain;    Currently in Pain? No/denies                 TREATMENT   Manual Therapy  Supine right single knee-to-chest stretch x45 seconds; Supine right hip FABER and FADIR stretches x45 seconds each; Supine right hamstring stretch x45 seconds; Supine right hip long axis distraction with belt assist 30 seconds x 2; Supine right hip grade 2 -3 inferior mobilizations at 90 degrees of flexion with belt assist 30 seconds x 2;  Supine right hip grade 2-3 medial to lateral mobilizations at 60 degrees of flexion with belt assist 30 seconds x 2; Supine right hip inferior grade 2-3 inferior mobilizations with right hip in FABER position and belt assist 30 seconds x 2; Prone right UPA mobilizations, C4-5, grade 2-3, 30 seconds/bout x 3 bouts/level; Prone STM to right low back and right posterior hip utilizing Thera-Band roller to improve tissue extensibility and decrease pain;   Ther-ex  HEP review; Prone straight knee hip extension  2 x 10; Hooklying posterior pelvic tilt 5-second hold x10 (added to HEP); Hooklying posterior pelvic tilt with alternating LE march x 10 BLE (added to HEP); Hooklying lumbar rotation x 10 each direction; Patient provided HEP progression with education about how to perform correctly at home   Trigger Point Dry Needling (TDN), unbilled Education performed with patient regarding potential benefit of TDN. Reviewed precautions and risks with patient. Extensive time spent with pt to ensure full understanding of TDN risks. Pt provided verbal consent to treatment.  In prone using clean technique TDN performed to R lumbar multifidi at L4 and L5 with 2, 0.25 x 60 single needle placements (one in each location). Pistoning technique utilized. Improved pain-free motion following intervention.     Pt educated throughout session about proper posture and technique with exercises. Improved exercise technique, movement at target joints, use of target muscles after min to mod verbal, visual, tactile cues.    Patient arrives excellent motivation to participate in physical therapy.  Initiated manual techniques for right lower back and right posterior hip.  Also initiated strengthening for low back, abdominals, and hips. HEP progression provided today during session.  Initiated trigger point dry needling to right lower back which patient is able to tolerate without excessive increase in pain.  Patient is already noticing improvement since starting with therapy and she is making good progress towards her goals. Pt will benefit from PT services to address deficits in strength, balance, and mobility in order to return to full function at home.                PT Education - 02/27/21 1615    Education Details HEP    Person(s) Educated Patient    Methods Explanation;Demonstration;Handout    Comprehension Verbalized understanding            PT Short Term Goals - 02/19/21 1129      PT SHORT TERM GOAL #1    Title Pt will be independent with HEP in order to improve strength and decrease back/hip pain in order to improve pain-free function at home.    Time 4    Period Weeks    Status New    Target Date 03/19/21             PT Long Term Goals - 02/19/21 1130      PT LONG TERM GOAL #1   Title Pt will decrease mODI score by at least 13 points in order demonstrate clinically significant reduction in back pain/disability.    Baseline 02/19/21: 36%    Time 8    Period Weeks    Status New    Target Date 04/16/21      PT LONG TERM GOAL #2   Title Pt will improve FOTO to at least 59 in order to demonstrate significant improvement in function related to her R low back/posterior hip pain    Baseline 02/19/21: 37    Time 8    Period Weeks  Status New    Target Date 04/16/21      PT LONG TERM GOAL #3   Title Pt will decrease worst back/R posterior hip pain as reported on NPRS by at least 3 points in order to demonstrate clinically significant reduction in back pain.    Baseline 02/19/21: 8/10    Time 8    Period Weeks    Status New    Target Date 04/16/21      PT LONG TERM GOAL #4   Title Pt will increase bilateral hip abduction and extension strength of by at least 1/2 MMT grade in order to demonstrate improvement in strength and function.    Baseline 02/19/21: 4-/5 bilaterally for both abduction and extension    Time 8    Period Weeks    Status New    Target Date 04/16/21                 Plan - 02/27/21 1617    Clinical Impression Statement Patient arrives excellent motivation to participate in physical therapy.  Initiated manual techniques for right lower back and right posterior hip.  Also initiated strengthening for low back, abdominals, and hips. HEP progression provided today during session.  Initiated trigger point dry needling to right lower back which patient is able to tolerate without excessive increase in pain.  Patient is already noticing improvement since starting  with therapy and she is making good progress towards her goals. Pt will benefit from PT services to address deficits in strength, balance, and mobility in order to return to full function at home.    Personal Factors and Comorbidities Age;Time since onset of injury/illness/exacerbation;Comorbidity 2    Comorbidities DDD, breast CA    Examination-Activity Limitations Transfers;Locomotion Level    Examination-Participation Restrictions Community Activity;Yard Work    Stability/Clinical Decision Making Stable/Uncomplicated    Rehab Potential Good    PT Frequency 2x / week    PT Duration 8 weeks    PT Treatment/Interventions ADLs/Self Care Home Management;Aquatic Therapy;Biofeedback;Canalith Repostioning;Cryotherapy;Electrical Stimulation;Iontophoresis 4mg /ml Dexamethasone;Moist Heat;Traction;Ultrasound;Gait training;Functional mobility training;Therapeutic activities;Therapeutic exercise;Balance training;Neuromuscular re-education;Manual techniques;Patient/family education;Passive range of motion;Dry needling;Vestibular;Spinal Manipulations;Joint Manipulations    PT Next Visit Plan Continue mobilzations of lower lumbar spine, STM to R posterior hip, review HEP, TDN    PT Home Exercise Plan Access Code: [email or web address]    Consulted and Agree with Plan of Care Patient           Patient will benefit from skilled therapeutic intervention in order to improve the following deficits and impairments:  Abnormal gait,Pain,Decreased strength  Visit Diagnosis: Chronic right-sided low back pain with right-sided sciatica  Pain in right hip     Problem List There are no problems to display for this patient.  Lyndel Safe Ena Demary PT, DPT, GCS  Nickalas Mccarrick 02/27/2021, 4:27 PM  [facility] [address] [place], [place], [address] Phone: [phone]   Fax:  [phone]  Name: Amber Blevins MRN: [id number] Date of Birth: 15-Sep-1951

## 2021-03-03 ENCOUNTER — Ambulatory Visit: Payer: Medicare PPO

## 2021-03-10 ENCOUNTER — Other Ambulatory Visit: Payer: Self-pay

## 2021-03-10 ENCOUNTER — Ambulatory Visit: Payer: Medicare PPO

## 2021-03-10 DIAGNOSIS — M25551 Pain in right hip: Secondary | ICD-10-CM

## 2021-03-10 DIAGNOSIS — G8929 Other chronic pain: Secondary | ICD-10-CM

## 2021-03-10 DIAGNOSIS — M5441 Lumbago with sciatica, right side: Secondary | ICD-10-CM | POA: Diagnosis not present

## 2021-03-10 DIAGNOSIS — M6281 Muscle weakness (generalized): Secondary | ICD-10-CM

## 2021-03-10 NOTE — Therapy (Signed)
[facility] [address] [place], [place], [address] Phone: [phone]   Fax:  [phone]  Physical Therapy Treatment  Patient Details  Name: Amber Blevins MRN: [id number] Date of Birth: 11-23-1950 Referring Provider (PT): Dr. Astrid Divine   Encounter Date: 03/10/2021   PT End of Session - 03/10/21 0916     Visit Number 3    Number of Visits 17    Date for PT Re-Evaluation 04/16/21    Authorization Type eval: 02/19/21    PT Start Time 0845    PT Stop Time 0930    PT Time Calculation (min) 45 min    Activity Tolerance Patient tolerated treatment well    Behavior During Therapy [facility] for tasks assessed/performed             History reviewed. No pertinent past medical history.  History reviewed. No pertinent surgical history.  There were no vitals filed for this visit.   Subjective Assessment - 03/10/21 0853     Subjective Pt reports that she is doing well today. She reports an increase in pain in RLE beginning in the posterior hip and radiating to anterior hip, groin and lateral calf starting on Saturday. She states current 8/10 pain in standing and that she has been using ice and Tylenol to relieve pain. She states pain has improved today compared to yesterday She has been performing her HEP without issue until 2 days ago.  No specific questions or concerns currently.    Pertinent History Pt reports intermittent low back pain for multiple years. Normally occurs with excess activity such as gardening. Pt reports some numbness and tingling in her sacral area which started in February 2022. She has not had saddle anesthesia or bowel or bladder incontinence. She is normally very active but due to COVID restrictions has been less active and thinks this has contributed. She states she was diagnosed with piriformis syndrome by her MD and was put on a 7 day prednisone taper as well as gabapentin. She states the prednisone did not help.  She has gradually increased her gabapentin and does report that this has been helpful. She currently gets radicular symptoms down her RLE. She had plain film radiographs of the lumbar spine which showed moderate to severe fecal loading throughout the length of the colon. Trace retrolisthesis of L3 versus L4. Trace retrolisthesis of L2 versus L3. No other malalignment. No fractures. Multilevel degenerative disc disease. Lower lumbar facet degenerative changes. Calcified atherosclerosis in the abdominal aorta. To help with the fecal burden she initially tried prune juice without relief so she used magnesium citrate which resolved the constipation. No improvement in the back pain once constipation resolved. She does have a history of breast cancer 12 years ago with lumpectomy and radiation. Released by oncology and now just has regular mammograms. She has a bone density test scheduled this year due to a history of estrogen blockers after her breast cancer.    How long can you sit comfortably? 1-2 hours    How long can you stand comfortably? 1-2 hours    Diagnostic tests See history    Patient Stated Goals Pt would like to be able to perform sit to stand and walk 10,000 steps without pain. Carry groceries without pain;    Currently in Pain? Yes    Pain Score 8     Pain Location Hip    Pain Orientation Right;Posterior    Pain Type Acute pain;Neuropathic pain    Pain  Radiating Towards down RLE to lateral calf                       TREATMENT     Manual Therapy  Supine right single knee-to-chest stretch 2 x 45 seconds; Supine right hip FABER and FADIR stretches 2 x 45 seconds each; Supine right hip long axis distraction with belt assist 30 seconds x 3; Prone right UPA mobilizations, L4-5, grade 2-3, 30 seconds/bout x 3 bouts/level; Side lying STM to right posterior hip utilizing Thera-Band roller to improve tissue extensibility and decrease pain; Side lying STM to right posterior  utilizing Hyper Volt massage gun      Pt educated throughout session about proper posture and technique with exercises. Improved exercise technique, movement at target joints, use of target muscles after min to mod verbal, visual, tactile cues.      Patient arrives with excellent motivation to participate in physical therapy.  Initiated manual techniques for right lower back and right posterior hip. HEP review and modification provided today during session.  Patient noticed improvement in pain and function from start of session to completion of therapy today. Pt will benefit from PT services to address deficits in strength, balance, and mobility in order to return to full function at home.                 PT Short Term Goals - 02/19/21 1129       PT SHORT TERM GOAL #1   Title Pt will be independent with HEP in order to improve strength and decrease back/hip pain in order to improve pain-free function at home.    Time 4    Period Weeks    Status New    Target Date 03/19/21               PT Long Term Goals - 02/19/21 1130       PT LONG TERM GOAL #1   Title Pt will decrease mODI score by at least 13 points in order demonstrate clinically significant reduction in back pain/disability.    Baseline 02/19/21: 36%    Time 8    Period Weeks    Status New    Target Date 04/16/21      PT LONG TERM GOAL #2   Title Pt will improve FOTO to at least 59 in order to demonstrate significant improvement in function related to her R low back/posterior hip pain    Baseline 02/19/21: 37    Time 8    Period Weeks    Status New    Target Date 04/16/21      PT LONG TERM GOAL #3   Title Pt will decrease worst back/R posterior hip pain as reported on NPRS by at least 3 points in order to demonstrate clinically significant reduction in back pain.    Baseline 02/19/21: 8/10    Time 8    Period Weeks    Status New    Target Date 04/16/21      PT LONG TERM GOAL #4   Title Pt will  increase bilateral hip abduction and extension strength of by at least 1/2 MMT grade in order to demonstrate improvement in strength and function.    Baseline 02/19/21: 4-/5 bilaterally for both abduction and extension    Time 8    Period Weeks    Status New    Target Date 04/16/21  Plan - 03/10/21 0914     Clinical Impression Statement Patient arrives with excellent motivation to participate in physical therapy.  Initiated manual techniques for right lower back and right posterior hip. HEP review and modification provided today during session.  Patient noticed improvement in pain and function from start of session to completion of therapy today. Pt will benefit from PT services to address deficits in strength, balance, and mobility in order to return to full function at home.    Personal Factors and Comorbidities Age;Time since onset of injury/illness/exacerbation;Comorbidity 2    Comorbidities DDD, breast CA    Examination-Activity Limitations Transfers;Locomotion Level    Examination-Participation Restrictions Community Activity;Yard Work    Stability/Clinical Decision Making Stable/Uncomplicated    Rehab Potential Good    PT Frequency 2x / week    PT Duration 8 weeks    PT Treatment/Interventions ADLs/Self Care Home Management;Aquatic Therapy;Biofeedback;Canalith Repostioning;Cryotherapy;Electrical Stimulation;Iontophoresis 4mg /ml Dexamethasone;Moist Heat;Traction;Ultrasound;Gait training;Functional mobility training;Therapeutic activities;Therapeutic exercise;Balance training;Neuromuscular re-education;Manual techniques;Patient/family education;Passive range of motion;Dry needling;Vestibular;Spinal Manipulations;Joint Manipulations    PT Next Visit Plan Continue mobilzations of lower lumbar spine, STM to R posterior hip, review HEP, TDN    PT Home Exercise Plan Access Code: [email or web address]    Consulted and Agree with Plan of Care Patient             Patient will  benefit from skilled therapeutic intervention in order to improve the following deficits and impairments:  Abnormal gait, Pain, Decreased strength  Visit Diagnosis: Chronic right-sided low back pain with right-sided sciatica  Pain in right hip  Muscle weakness (generalized)     Problem List There are no problems to display for this patient.  Lyndel Safe Jaylianna Tatlock PT, DPT, GCS  Mariya Mottley 03/10/2021, 11:27 AM  [place] [address]. [place], [place], [address] Phone: [phone]   Fax:  [phone]  Name: Amber Blevins MRN: [id number] Date of Birth: March 16, 1951

## 2021-03-12 ENCOUNTER — Ambulatory Visit: Payer: Medicare PPO

## 2021-03-12 ENCOUNTER — Other Ambulatory Visit: Payer: Self-pay

## 2021-03-12 DIAGNOSIS — G8929 Other chronic pain: Secondary | ICD-10-CM

## 2021-03-12 DIAGNOSIS — M6281 Muscle weakness (generalized): Secondary | ICD-10-CM

## 2021-03-12 DIAGNOSIS — M5441 Lumbago with sciatica, right side: Secondary | ICD-10-CM | POA: Diagnosis not present

## 2021-03-12 DIAGNOSIS — M25551 Pain in right hip: Secondary | ICD-10-CM

## 2021-03-12 NOTE — Therapy (Signed)
[facility] [address]. [place], [place], [address] Phone: [phone]   Fax:  [phone]  Physical Therapy Treatment  Patient Details  Name: Amber Blevins MRN: [id number] Date of Birth: 1951/07/19 Referring Provider (PT): Dr. Astrid Divine   Encounter Date: 03/12/2021   PT End of Session - 03/12/21 0756     Visit Number 4    Number of Visits 17    Date for PT Re-Evaluation 04/16/21    Authorization Type eval: 02/19/21    PT Start Time 0800    PT Stop Time 0845    PT Time Calculation (min) 45 min    Activity Tolerance Patient tolerated treatment well    Behavior During Therapy [facility] for tasks assessed/performed             History reviewed. No pertinent past medical history.  History reviewed. No pertinent surgical history.  There were no vitals filed for this visit.   Subjective Assessment - 03/12/21 0755     Subjective Pt reports that she is doing alright today. She continues with posterior R hip pain but it has improved slightly since last visit. She rates it as 4/10 upon arrival. She has been performing her HEP. No specific questions or concerns currently.    Pertinent History Pt reports intermittent low back pain for multiple years. Normally occurs with excess activity such as gardening. Pt reports some numbness and tingling in her sacral area which started in February 2022. She has not had saddle anesthesia or bowel or bladder incontinence. She is normally very active but due to COVID restrictions has been less active and thinks this has contributed. She states she was diagnosed with piriformis syndrome by her MD and was put on a 7 day prednisone taper as well as gabapentin. She states the prednisone did not help. She has gradually increased her gabapentin and does report that this has been helpful. She currently gets radicular symptoms down her RLE. She had plain film radiographs of the lumbar spine which showed moderate  to severe fecal loading throughout the length of the colon. Trace retrolisthesis of L3 versus L4. Trace retrolisthesis of L2 versus L3. No other malalignment. No fractures. Multilevel degenerative disc disease. Lower lumbar facet degenerative changes. Calcified atherosclerosis in the abdominal aorta. To help with the fecal burden she initially tried prune juice without relief so she used magnesium citrate which resolved the constipation. No improvement in the back pain once constipation resolved. She does have a history of breast cancer 12 years ago with lumpectomy and radiation. Released by oncology and now just has regular mammograms. She has a bone density test scheduled this year due to a history of estrogen blockers after her breast cancer.    How long can you sit comfortably? 1-2 hours    How long can you stand comfortably? 1-2 hours    Diagnostic tests See history    Patient Stated Goals Pt would like to be able to perform sit to stand and walk 10,000 steps without pain. Carry groceries without pain;    Currently in Pain? Yes    Pain Score 4     Pain Location Hip    Pain Orientation Right;Posterior    Pain Descriptors / Indicators Heaviness    Pain Type Acute pain    Pain Onset More than a month ago    Pain Frequency Intermittent                TREATMENT  Manual Therapy  Supine right single knee-to-chest stretch 2 x 45 seconds; Supine right hip FABER and FADIR stretches 2 x 45 seconds each; Supine right hip long axis distraction 30 seconds x 3; Prone CPA and right UPA mobilizations, L2-5, grade 2-3, 30 seconds/bout x 3 bouts/level; L side lying STM to right posterior hip utilizing Thera-Band roller to improve tissue extensibility and decrease pain; L side lying STM to right posterior utilizing Hyper Volt massage gun; Applied BioFreeze to R posterior hip prior to pt leaving session     Pt educated throughout session about proper posture and technique with exercises.  Improved exercise technique, movement at target joints, use of target muscles after min to mod verbal, visual, tactile cues.      Patient arrives with excellent motivation to participate in physical therapy.  Continued manual techniques for central and right lower back and right posterior hip. Reviewed HEP today during session and advised pt not to perform standing trunk extensions after pt reported that exercise aggravated LBP.  Patient noticed improvement in pain and function from start of session to completion of therapy today. Pt will benefit from PT services to address deficits in strength, balance, and mobility in order to return to full function at home.           PT Short Term Goals - 02/19/21 1129       PT SHORT TERM GOAL #1   Title Pt will be independent with HEP in order to improve strength and decrease back/hip pain in order to improve pain-free function at home.    Time 4    Period Weeks    Status New    Target Date 03/19/21               PT Long Term Goals - 02/19/21 1130       PT LONG TERM GOAL #1   Title Pt will decrease mODI score by at least 13 points in order demonstrate clinically significant reduction in back pain/disability.    Baseline 02/19/21: 36%    Time 8    Period Weeks    Status New    Target Date 04/16/21      PT LONG TERM GOAL #2   Title Pt will improve FOTO to at least 59 in order to demonstrate significant improvement in function related to her R low back/posterior hip pain    Baseline 02/19/21: 37    Time 8    Period Weeks    Status New    Target Date 04/16/21      PT LONG TERM GOAL #3   Title Pt will decrease worst back/R posterior hip pain as reported on NPRS by at least 3 points in order to demonstrate clinically significant reduction in back pain.    Baseline 02/19/21: 8/10    Time 8    Period Weeks    Status New    Target Date 04/16/21      PT LONG TERM GOAL #4   Title Pt will increase bilateral hip abduction and extension  strength of by at least 1/2 MMT grade in order to demonstrate improvement in strength and function.    Baseline 02/19/21: 4-/5 bilaterally for both abduction and extension    Time 8    Period Weeks    Status New    Target Date 04/16/21                   Plan - 03/12/21 0756     Clinical  Impression Statement Patient arrives with excellent motivation to participate in physical therapy.  Continued manual techniques for central and right lower back and right posterior hip. Reviewed HEP today during session and advised pt not to perform standing trunk extensions after pt reported that exercise aggravated LBP.  Patient noticed improvement in pain and function from start of session to completion of therapy today. Pt will benefit from PT services to address deficits in strength, balance, and mobility in order to return to full function at home.    Personal Factors and Comorbidities Age;Time since onset of injury/illness/exacerbation;Comorbidity 2    Comorbidities DDD, breast CA    Examination-Activity Limitations Transfers;Locomotion Level    Examination-Participation Restrictions Community Activity;Yard Work    Stability/Clinical Decision Making Stable/Uncomplicated    Rehab Potential Good    PT Frequency 2x / week    PT Duration 8 weeks    PT Treatment/Interventions ADLs/Self Care Home Management;Aquatic Therapy;Biofeedback;Canalith Repostioning;Cryotherapy;Electrical Stimulation;Iontophoresis 4mg /ml Dexamethasone;Moist Heat;Traction;Ultrasound;Gait training;Functional mobility training;Therapeutic activities;Therapeutic exercise;Balance training;Neuromuscular re-education;Manual techniques;Patient/family education;Passive range of motion;Dry needling;Vestibular;Spinal Manipulations;Joint Manipulations    PT Next Visit Plan Continue mobilzations of lower lumbar spine, STM to R posterior hip, review HEP, TDN    PT Home Exercise Plan Access Code: [email or web address]    Consulted and Agree with Plan of  Care Patient             Patient will benefit from skilled therapeutic intervention in order to improve the following deficits and impairments:  Abnormal gait, Pain, Decreased strength  Visit Diagnosis: Chronic right-sided low back pain with right-sided sciatica  Pain in right hip  Muscle weakness (generalized)     Problem List There are no problems to display for this patient.  Lyndel Safe Thien Berka PT, DPT, GCS  Amber Blevins 03/12/2021, 12:56 PM  [facility] [address]. [place], [place], [address] Phone: [phone]   Fax:  [phone]  Name: Amber Blevins MRN: [id number] Date of Birth: [birth year]

## 2021-03-17 ENCOUNTER — Ambulatory Visit: Payer: Medicare PPO

## 2021-03-17 ENCOUNTER — Other Ambulatory Visit: Payer: Self-pay

## 2021-03-17 ENCOUNTER — Other Ambulatory Visit: Payer: Self-pay | Admitting: Family Medicine

## 2021-03-17 DIAGNOSIS — M6281 Muscle weakness (generalized): Secondary | ICD-10-CM

## 2021-03-17 DIAGNOSIS — M5441 Lumbago with sciatica, right side: Secondary | ICD-10-CM | POA: Diagnosis not present

## 2021-03-17 DIAGNOSIS — G8929 Other chronic pain: Secondary | ICD-10-CM

## 2021-03-17 DIAGNOSIS — Z780 Asymptomatic menopausal state: Secondary | ICD-10-CM

## 2021-03-17 DIAGNOSIS — Z1231 Encounter for screening mammogram for malignant neoplasm of breast: Secondary | ICD-10-CM

## 2021-03-17 DIAGNOSIS — M25551 Pain in right hip: Secondary | ICD-10-CM

## 2021-03-17 NOTE — Therapy (Signed)
[place] [facility] [address] [place], [place], [address] Phone: [phone]   Fax:  [phone]  Physical Therapy Treatment  Patient Details  Name: Amber Blevins MRN: [id number] Date of Birth: 1951/03/22 Referring Provider (PT): Dr. Astrid Divine   Encounter Date: 03/17/2021   PT End of Session - 03/17/21 0939     Visit Number 5    Number of Visits 17    Date for PT Re-Evaluation 04/16/21    Authorization Type eval: 02/19/21    PT Start Time 0848    PT Stop Time 0932    PT Time Calculation (min) 44 min    Activity Tolerance Patient tolerated treatment well    Behavior During Therapy [facility] for tasks assessed/performed             History reviewed. No pertinent past medical history.  History reviewed. No pertinent surgical history.  There were no vitals filed for this visit.   Subjective Assessment - 03/17/21 0935     Subjective Pt reports that she is doing well today. She reports new pain at the location of the right proximal hamstring; pain increases with hip flexion. She continues with posterior R hip pain but it has improved since last visit. She rates it as 3/10 upon arrival. She has been performing her HEP and states she would like to return to walking in her neighborhood. Current concern is related to new pain in R hamstring.    Pertinent History Pt reports intermittent low back pain for multiple years. Normally occurs with excess activity such as gardening. Pt reports some numbness and tingling in her sacral area which started in February 2022. She has not had saddle anesthesia or bowel or bladder incontinence. She is normally very active but due to COVID restrictions has been less active and thinks this has contributed. She states she was diagnosed with piriformis syndrome by her MD and was put on a 7 day prednisone taper as well as gabapentin. She states the prednisone did not help. She has gradually increased her gabapentin  and does report that this has been helpful. She currently gets radicular symptoms down her RLE. She had plain film radiographs of the lumbar spine which showed moderate to severe fecal loading throughout the length of the colon. Trace retrolisthesis of L3 versus L4. Trace retrolisthesis of L2 versus L3. No other malalignment. No fractures. Multilevel degenerative disc disease. Lower lumbar facet degenerative changes. Calcified atherosclerosis in the abdominal aorta. To help with the fecal burden she initially tried prune juice without relief so she used magnesium citrate which resolved the constipation. No improvement in the back pain once constipation resolved. She does have a history of breast cancer 12 years ago with lumpectomy and radiation. Released by oncology and now just has regular mammograms. She has a bone density test scheduled this year due to a history of estrogen blockers after her breast cancer.    How long can you sit comfortably? 1-2 hours    How long can you stand comfortably? 1-2 hours    Diagnostic tests See history    Patient Stated Goals Pt would like to be able to perform sit to stand and walk 10,000 steps without pain. Carry groceries without pain;    Currently in Pain? Yes    Pain Score 3     Pain Location Hip    Pain Orientation Right;Posterior    Pain Type Acute pain    Pain Onset More than a month ago  TREATMENT     Manual Therapy  Supine right single knee-to-chest stretch 2 x 45 seconds; Supine right hip FABER and FADIR stretches 2 x 45 seconds each; Supine right hip long axis distraction 30 seconds x 3; Prone CPA and right UPA mobilizations, L2-5, grade 2-3, 30 seconds/bout x 3 bouts/level; L side lying STM to right posterior hip utilizing Thera-Band roller to improve tissue extensibility and decrease pain;     Pt educated throughout session about proper posture and technique with exercises. Improved exercise technique, movement at target  joints, use of target muscles after min to mod verbal, visual, tactile cues.      Patient arrives with excellent motivation to participate in physical therapy.  Continued manual techniques for central and right lower back and right posterior hip due to success with decreasing pain in previous sessions. Reviewed HEP today during session. Pt encouraged to begin incorporating progressive walking program to daily routine. Patient noticed improvement in pain and function from start of session to completion of therapy today. Pt will benefit from PT services to address deficits in strength, balance, and mobility in order to return to full function at home.         PT Short Term Goals - 02/19/21 1129       PT SHORT TERM GOAL #1   Title Pt will be independent with HEP in order to improve strength and decrease back/hip pain in order to improve pain-free function at home.    Time 4    Period Weeks    Status New    Target Date 03/19/21               PT Long Term Goals - 02/19/21 1130       PT LONG TERM GOAL #1   Title Pt will decrease mODI score by at least 13 points in order demonstrate clinically significant reduction in back pain/disability.    Baseline 02/19/21: 36%    Time 8    Period Weeks    Status New    Target Date 04/16/21      PT LONG TERM GOAL #2   Title Pt will improve FOTO to at least 59 in order to demonstrate significant improvement in function related to her R low back/posterior hip pain    Baseline 02/19/21: 37    Time 8    Period Weeks    Status New    Target Date 04/16/21      PT LONG TERM GOAL #3   Title Pt will decrease worst back/R posterior hip pain as reported on NPRS by at least 3 points in order to demonstrate clinically significant reduction in back pain.    Baseline 02/19/21: 8/10    Time 8    Period Weeks    Status New    Target Date 04/16/21      PT LONG TERM GOAL #4   Title Pt will increase bilateral hip abduction and extension strength of by  at least 1/2 MMT grade in order to demonstrate improvement in strength and function.    Baseline 02/19/21: 4-/5 bilaterally for both abduction and extension    Time 8    Period Weeks    Status New    Target Date 04/16/21                   Plan - 03/17/21 0940     Clinical Impression Statement Patient arrives with excellent motivation to participate in physical therapy.  Continued manual techniques for  central and right lower back and right posterior hip due to success with decreasing pain in previous sessions. Reviewed HEP today during session. Pt encouraged to begin incorporating progressive walking program to daily routine. Patient noticed improvement in pain and function from start of session to completion of therapy today. Pt will benefit from PT services to address deficits in strength, balance, and mobility in order to return to full function at home.    Personal Factors and Comorbidities Age;Time since onset of injury/illness/exacerbation;Comorbidity 2    Comorbidities DDD, breast CA    Examination-Activity Limitations Transfers;Locomotion Level    Examination-Participation Restrictions Community Activity;Yard Work    Stability/Clinical Decision Making Stable/Uncomplicated    Rehab Potential Good    PT Frequency 2x / week    PT Duration 8 weeks    PT Treatment/Interventions ADLs/Self Care Home Management;Aquatic Therapy;Biofeedback;Canalith Repostioning;Cryotherapy;Electrical Stimulation;Iontophoresis 4mg /ml Dexamethasone;Moist Heat;Traction;Ultrasound;Gait training;Functional mobility training;Therapeutic activities;Therapeutic exercise;Balance training;Neuromuscular re-education;Manual techniques;Patient/family education;Passive range of motion;Dry needling;Vestibular;Spinal Manipulations;Joint Manipulations    PT Next Visit Plan Continue mobilzations of lower lumbar spine, STM to R posterior hip, review HEP, TDN    PT Home Exercise Plan Access Code: [email or web address]    Consulted and  Agree with Plan of Care Patient             Patient will benefit from skilled therapeutic intervention in order to improve the following deficits and impairments:  Abnormal gait, Pain, Decreased strength  Visit Diagnosis: Chronic right-sided low back pain with right-sided sciatica  Pain in right hip  Muscle weakness (generalized)     Problem List There are no problems to display for this patient.  Patrina Levering PT, DPT  Ramonita Lab 03/17/2021, 9:46 AM  [place] [facility] [address] [place], [place], [address] Phone: [phone]   Fax:  [phone]  Name: Amber Blevins MRN: [id number] Date of Birth: 1951/02/14

## 2021-03-18 NOTE — Patient Instructions (Addendum)
Access Code: [email or web address] URL: [email or web address] Date: 03/19/2021 Prepared by: Roxana Hires  Exercises Supine Piriformis Stretch with Leg Straight - 3 x daily - 7 x weekly - 3 reps - 45s hold Seated Piriformis Stretch (Mirrored) - 3 x daily - 7 x weekly - 3 reps - 45s hold Seated Figure 4 Piriformis Stretch (Mirrored) - 3 x daily - 7 x weekly - 3 reps - 45s hold Standing Lumbar Extension - 3 x daily - 7 x weekly - 2 sets - 10 reps - 5s hold Supine Posterior Pelvic Tilt - 2 x daily - 7 x weekly - 2 sets - 10 reps - 5s hold Supine March with Posterior Pelvic Tilt - 2 x daily - 7 x weekly - 2 sets - 10 reps - 5s hold

## 2021-03-19 ENCOUNTER — Ambulatory Visit: Payer: Medicare PPO

## 2021-03-19 ENCOUNTER — Other Ambulatory Visit: Payer: Self-pay

## 2021-03-19 DIAGNOSIS — M25551 Pain in right hip: Secondary | ICD-10-CM

## 2021-03-19 DIAGNOSIS — M5441 Lumbago with sciatica, right side: Secondary | ICD-10-CM | POA: Diagnosis not present

## 2021-03-19 DIAGNOSIS — M6281 Muscle weakness (generalized): Secondary | ICD-10-CM

## 2021-03-19 DIAGNOSIS — G8929 Other chronic pain: Secondary | ICD-10-CM

## 2021-03-19 NOTE — Therapy (Signed)
[facility] [address] [place], [place], [address] Phone: [phone]   Fax:  [phone]  Physical Therapy Treatment  Patient Details  Name: Amber Blevins MRN: [id number] Date of Birth: [birth year] Referring Provider (PT): Dr. Astrid Divine   Encounter Date: 03/19/2021   PT End of Session - 03/19/21 1610     Visit Number 6    Number of Visits 17    Date for PT Re-Evaluation 04/16/21    Authorization Type eval: 02/19/21    PT Start Time 0845    PT Stop Time 0930    PT Time Calculation (min) 45 min    Activity Tolerance Patient tolerated treatment well    Behavior During Therapy [facility] for tasks assessed/performed             History reviewed. No pertinent past medical history.  History reviewed. No pertinent surgical history.  There were no vitals filed for this visit.   Subjective Assessment - 03/19/21 0849     Subjective Pt reports that she is doing alright today. She reports persistent pain in R posterior hip which also radiates into her R anteromedial thigh. She did a lot of walking yesterday. She rates her pain currently as 4/10. Current concern is related to new pain in R hamstring.    Pertinent History Pt reports intermittent low back pain for multiple years. Normally occurs with excess activity such as gardening. Pt reports some numbness and tingling in her sacral area which started in February 2022. She has not had saddle anesthesia or bowel or bladder incontinence. She is normally very active but due to COVID restrictions has been less active and thinks this has contributed. She states she was diagnosed with piriformis syndrome by her MD and was put on a 7 day prednisone taper as well as gabapentin. She states the prednisone did not help. She has gradually increased her gabapentin and does report that this has been helpful. She currently gets radicular symptoms down her RLE. She had plain film radiographs of the lumbar  spine which showed moderate to severe fecal loading throughout the length of the colon. Trace retrolisthesis of L3 versus L4. Trace retrolisthesis of L2 versus L3. No other malalignment. No fractures. Multilevel degenerative disc disease. Lower lumbar facet degenerative changes. Calcified atherosclerosis in the abdominal aorta. To help with the fecal burden she initially tried prune juice without relief so she used magnesium citrate which resolved the constipation. No improvement in the back pain once constipation resolved. She does have a history of breast cancer 12 years ago with lumpectomy and radiation. Released by oncology and now just has regular mammograms. She has a bone density test scheduled this year due to a history of estrogen blockers after her breast cancer.    How long can you sit comfortably? 1-2 hours    How long can you stand comfortably? 1-2 hours    Diagnostic tests See history    Patient Stated Goals Pt would like to be able to perform sit to stand and walk 10,000 steps without pain. Carry groceries without pain;    Currently in Pain? Yes    Pain Score 4     Pain Location Hip    Pain Orientation Right;Posterior    Pain Descriptors / Indicators Heaviness;Shooting    Pain Type Chronic pain    Pain Onset More than a month ago                  TREATMENT  Manual Therapy  Prone moist heat pack to posterior R hip during interim history x 5 minutes; Prone STM to posterior R hip including glut max, med, min, and deep external rotators, utilized Theraband roller and Hypervolt; Prone CPA and right UPA mobilizations, L2-5, grade 2-3, 30 seconds/bout x 3 bouts/level; Supine right single knee-to-chest stretch x 45 seconds; Supine right hip FABER and FADIR stretches x 45 seconds each; Supine right hip long axis distraction 30 seconds x 3;   Ther-ex  Prone alternating straight knee hip extension x 1 minute (3s hold), added 3# ankle weights x 1 minute; Prone HS curls  with 3# ankle weights 2 x 1 minute; Hooklying posterior pelvic tilts x 10; Hooklying posterior pelvic tilts with alternating marches x 10 BLE;  Hooklying posterior pelvic tilts with SLR x 10 BLE;    Pt educated throughout session about proper posture and technique with exercises. Improved exercise technique, movement at target joints, use of target muscles after min to mod verbal, visual, tactile cues.      Patient arrives with excellent motivation to participate in physical therapy.  Continued manual techniques for central and right lower back and right posterior hip due to success with decreasing pain in previous sessions. Updated HEP today during session. Reintroduced hooklying marches and SLR without any increase in pain. Patient noticed improvement in pain and function from start of session to completion of therapy today however she doe report fatigue at the end of the session. Pt will benefit from PT services to address deficits in strength and pain in order to return to full function at home.                  PT Short Term Goals - 02/19/21 1129       PT SHORT TERM GOAL #1   Title Pt will be independent with HEP in order to improve strength and decrease back/hip pain in order to improve pain-free function at home.    Time 4    Period Weeks    Status New    Target Date 03/19/21               PT Long Term Goals - 02/19/21 1130       PT LONG TERM GOAL #1   Title Pt will decrease mODI score by at least 13 points in order demonstrate clinically significant reduction in back pain/disability.    Baseline 02/19/21: 36%    Time 8    Period Weeks    Status New    Target Date 04/16/21      PT LONG TERM GOAL #2   Title Pt will improve FOTO to at least 59 in order to demonstrate significant improvement in function related to her R low back/posterior hip pain    Baseline 02/19/21: 37    Time 8    Period Weeks    Status New    Target Date 04/16/21      PT LONG TERM  GOAL #3   Title Pt will decrease worst back/R posterior hip pain as reported on NPRS by at least 3 points in order to demonstrate clinically significant reduction in back pain.    Baseline 02/19/21: 8/10    Time 8    Period Weeks    Status New    Target Date 04/16/21      PT LONG TERM GOAL #4   Title Pt will increase bilateral hip abduction and extension strength of by at least 1/2 MMT grade in  order to demonstrate improvement in strength and function.    Baseline 02/19/21: 4-/5 bilaterally for both abduction and extension    Time 8    Period Weeks    Status New    Target Date 04/16/21                   Plan - 03/19/21 1610     Clinical Impression Statement Patient arrives with excellent motivation to participate in physical therapy.  Continued manual techniques for central and right lower back and right posterior hip due to success with decreasing pain in previous sessions. Updated HEP today during session. Reintroduced hooklying marches and SLR without any increase in pain. Patient noticed improvement in pain and function from start of session to completion of therapy today however she doe report fatigue at the end of the session. Pt will benefit from PT services to address deficits in strength and pain in order to return to full function at home.    Personal Factors and Comorbidities Age;Time since onset of injury/illness/exacerbation;Comorbidity 2    Comorbidities DDD, breast CA    Examination-Activity Limitations Transfers;Locomotion Level    Examination-Participation Restrictions Community Activity;Yard Work    Stability/Clinical Decision Making Stable/Uncomplicated    Rehab Potential Good    PT Frequency 2x / week    PT Duration 8 weeks    PT Treatment/Interventions ADLs/Self Care Home Management;Aquatic Therapy;Biofeedback;Canalith Repostioning;Cryotherapy;Electrical Stimulation;Iontophoresis 4mg /ml Dexamethasone;Moist Heat;Traction;Ultrasound;Gait training;Functional  mobility training;Therapeutic activities;Therapeutic exercise;Balance training;Neuromuscular re-education;Manual techniques;Patient/family education;Passive range of motion;Dry needling;Vestibular;Spinal Manipulations;Joint Manipulations    PT Next Visit Plan Continue mobilzations of lower lumbar spine, STM to R posterior hip, review HEP, TDN    PT Home Exercise Plan Access Code: [email or web address]    Consulted and Agree with Plan of Care Patient             Patient will benefit from skilled therapeutic intervention in order to improve the following deficits and impairments:  Abnormal gait, Pain, Decreased strength  Visit Diagnosis: Chronic right-sided low back pain with right-sided sciatica  Pain in right hip  Muscle weakness (generalized)     Problem List There are no problems to display for this patient.  Lyndel Safe Keyonna Comunale PT, DPT, GCS  Eileen Kangas 03/19/2021, 4:15 PM  [facility] [address] [place], [place], [address] Phone: [phone]   Fax:  [phone]  Name: Amber Blevins MRN: [id number] Date of Birth: 09-01-1951

## 2021-03-24 ENCOUNTER — Ambulatory Visit: Payer: Medicare PPO | Admitting: Physical Therapy

## 2021-03-24 ENCOUNTER — Encounter: Payer: Self-pay | Admitting: Physical Therapy

## 2021-03-24 ENCOUNTER — Other Ambulatory Visit: Payer: Self-pay

## 2021-03-24 DIAGNOSIS — M25551 Pain in right hip: Secondary | ICD-10-CM

## 2021-03-24 DIAGNOSIS — M6281 Muscle weakness (generalized): Secondary | ICD-10-CM

## 2021-03-24 DIAGNOSIS — M5441 Lumbago with sciatica, right side: Secondary | ICD-10-CM | POA: Diagnosis not present

## 2021-03-24 DIAGNOSIS — G8929 Other chronic pain: Secondary | ICD-10-CM

## 2021-03-24 NOTE — Therapy (Signed)
[facility] [address] [place], [place], [address] Phone: [phone]   Fax:  [phone]  Physical Therapy Treatment  Patient Details  Name: Amber Blevins MRN: [id number] Date of Birth: 1951/04/10 Referring Provider (PT): Dr. Astrid Divine   Encounter Date: 03/24/2021   PT End of Session - 03/24/21 0942     Visit Number 7    Number of Visits 17    Date for PT Re-Evaluation 04/16/21    Authorization Type eval: 02/19/21    PT Start Time 0845    PT Stop Time 0931    PT Time Calculation (min) 46 min    Activity Tolerance Patient tolerated treatment well    Behavior During Therapy [facility] for tasks assessed/performed             History reviewed. No pertinent past medical history.  History reviewed. No pertinent surgical history.  There were no vitals filed for this visit.   Subjective Assessment - 03/24/21 0839     Subjective Pt reports that she is doing alright today. She states she has been sleeping on the couch for "additional support" when the pain is aggravated. She reports pain is no longer persistent and that she does experience periods of relief. She rates her pain currently as 2-3/10. She has been performing her HEP and walking. She has not began gardening againm stating she is afraid of bending over and aggravating the pain.    Pertinent History Pt reports intermittent low back pain for multiple years. Normally occurs with excess activity such as gardening. Pt reports some numbness and tingling in her sacral area which started in February 2022. She has not had saddle anesthesia or bowel or bladder incontinence. She is normally very active but due to COVID restrictions has been less active and thinks this has contributed. She states she was diagnosed with piriformis syndrome by her MD and was put on a 7 day prednisone taper as well as gabapentin. She states the prednisone did not help. She has gradually increased her  gabapentin and does report that this has been helpful. She currently gets radicular symptoms down her RLE. She had plain film radiographs of the lumbar spine which showed moderate to severe fecal loading throughout the length of the colon. Trace retrolisthesis of L3 versus L4. Trace retrolisthesis of L2 versus L3. No other malalignment. No fractures. Multilevel degenerative disc disease. Lower lumbar facet degenerative changes. Calcified atherosclerosis in the abdominal aorta. To help with the fecal burden she initially tried prune juice without relief so she used magnesium citrate which resolved the constipation. No improvement in the back pain once constipation resolved. She does have a history of breast cancer 12 years ago with lumpectomy and radiation. Released by oncology and now just has regular mammograms. She has a bone density test scheduled this year due to a history of estrogen blockers after her breast cancer.    How long can you sit comfortably? 1-2 hours    How long can you stand comfortably? 1-2 hours    Diagnostic tests See history    Patient Stated Goals Pt would like to be able to perform sit to stand and walk 10,000 steps without pain. Carry groceries without pain;    Currently in Pain? Yes    Pain Score 3     Pain Location Hip    Pain Orientation Right;Proximal    Pain Descriptors / Indicators Tightness;Discomfort    Pain Type Chronic pain    Pain Onset  More than a month ago                TREATMENT     Manual Therapy  Prone moist heat pack to posterior R hip during interim history x 5 minutes; Prone STM to posterior R hip including glut max, med, min, and deep external rotators, utilized Theraband roller and Hypervolt; Prone CPA and right UPA mobilizations, L2-5, grade 2-3, 30 seconds/bout x 3 bouts/level; Supine right single knee-to-chest stretch x 45 seconds; Supine right hip FABER and FADIR stretches x 45 seconds each; Supine right hip long axis distraction 30  seconds x 3;     Ther-ex  Prone alternating straight knee hip extension x 1 minute (3s hold), added 3# ankle weights x 1 minute; Prone HS curls with 3# ankle weights 2 x 1 minute; Hooklying posterior pelvic tilts x 10; Hooklying posterior pelvic tilts with alternating marches x 10 BLE; Hooklying posterior pelvic tilts with SLR x 10 BLE;     Pt educated throughout session about proper posture and technique with exercises. Improved exercise technique, movement at target joints, use of target muscles after min to mod verbal, visual, tactile cues.      Patient arrives with excellent motivation to participate in physical therapy. Continued manual techniques for central and right lower back and right posterior hip due to success with decreasing pain in previous sessions. Added SLR with posterior pelvic tilt to HEP. Patient noted no pain with any of the exercises and improvement in pain and function from start of session to completion of therapy today. She demonstrates weakness in R hip during prone hip extension exercise. Next session, pt could progress in weight with HS curl. Pt will benefit from PT services to address deficits in strength and pain in order to return to full function at home.        PT Short Term Goals - 02/19/21 1129       PT SHORT TERM GOAL #1   Title Pt will be independent with HEP in order to improve strength and decrease back/hip pain in order to improve pain-free function at home.    Time 4    Period Weeks    Status New    Target Date 03/19/21               PT Long Term Goals - 02/19/21 1130       PT LONG TERM GOAL #1   Title Pt will decrease mODI score by at least 13 points in order demonstrate clinically significant reduction in back pain/disability.    Baseline 02/19/21: 36%    Time 8    Period Weeks    Status New    Target Date 04/16/21      PT LONG TERM GOAL #2   Title Pt will improve FOTO to at least 59 in order to demonstrate significant  improvement in function related to her R low back/posterior hip pain    Baseline 02/19/21: 37    Time 8    Period Weeks    Status New    Target Date 04/16/21      PT LONG TERM GOAL #3   Title Pt will decrease worst back/R posterior hip pain as reported on NPRS by at least 3 points in order to demonstrate clinically significant reduction in back pain.    Baseline 02/19/21: 8/10    Time 8    Period Weeks    Status New    Target Date 04/16/21  PT LONG TERM GOAL #4   Title Pt will increase bilateral hip abduction and extension strength of by at least 1/2 MMT grade in order to demonstrate improvement in strength and function.    Baseline 02/19/21: 4-/5 bilaterally for both abduction and extension    Time 8    Period Weeks    Status New    Target Date 04/16/21                   Plan - 03/24/21 0943     Clinical Impression Statement Patient arrives with excellent motivation to participate in physical therapy. Continued manual techniques for central and right lower back and right posterior hip due to success with decreasing pain in previous sessions. Added SLR with posterior pelvic tilt to HEP. Patient noted no pain with any of the exercises and improvement in pain and function from start of session to completion of therapy today. She demonstrates weakness in R hip during prone hip extension exercise. Next session, pt could progress in weight with HS curl. Pt will benefit from PT services to address deficits in strength and pain in order to return to full function at home.    Personal Factors and Comorbidities Age;Time since onset of injury/illness/exacerbation;Comorbidity 2    Comorbidities DDD, breast CA    Examination-Activity Limitations Transfers;Locomotion Level    Examination-Participation Restrictions Community Activity;Yard Work    Stability/Clinical Decision Making Stable/Uncomplicated    Rehab Potential Good    PT Frequency 2x / week    PT Duration 8 weeks    PT  Treatment/Interventions ADLs/Self Care Home Management;Aquatic Therapy;Biofeedback;Canalith Repostioning;Cryotherapy;Electrical Stimulation;Iontophoresis 4mg /ml Dexamethasone;Moist Heat;Traction;Ultrasound;Gait training;Functional mobility training;Therapeutic activities;Therapeutic exercise;Balance training;Neuromuscular re-education;Manual techniques;Patient/family education;Passive range of motion;Dry needling;Vestibular;Spinal Manipulations;Joint Manipulations    PT Next Visit Plan Continue mobilzations of lower lumbar spine, STM to R posterior hip, review HEP, TDN    PT Home Exercise Plan Access Code: [email or web address]    Consulted and Agree with Plan of Care Patient             Patient will benefit from skilled therapeutic intervention in order to improve the following deficits and impairments:  Abnormal gait, Pain, Decreased strength  Visit Diagnosis: Chronic right-sided low back pain with right-sided sciatica  Pain in right hip  Muscle weakness (generalized)     Problem List There are no problems to display for this patient.  Patrina Levering PT, DPT  Ramonita Lab 03/24/2021, 9:45 AM  [place] [address] [place], [place], [address] Phone: [phone]   Fax:  [phone]  Name: Amber Blevins MRN: [id number] Date of Birth: 12/02/1950

## 2021-03-26 ENCOUNTER — Encounter: Payer: Self-pay | Admitting: Physical Therapy

## 2021-03-26 ENCOUNTER — Ambulatory Visit: Payer: Medicare PPO | Admitting: Physical Therapy

## 2021-03-26 ENCOUNTER — Other Ambulatory Visit: Payer: Self-pay

## 2021-03-26 DIAGNOSIS — M5441 Lumbago with sciatica, right side: Secondary | ICD-10-CM | POA: Diagnosis not present

## 2021-03-26 DIAGNOSIS — M6281 Muscle weakness (generalized): Secondary | ICD-10-CM

## 2021-03-26 DIAGNOSIS — M25551 Pain in right hip: Secondary | ICD-10-CM

## 2021-03-26 DIAGNOSIS — G8929 Other chronic pain: Secondary | ICD-10-CM

## 2021-03-26 NOTE — Therapy (Signed)
[facility] [address] [place], [place], [address] Phone: [phone]   Fax:  [phone]  Physical Therapy Treatment  Patient Details  Name: Amber Blevins MRN: [id number] Date of Birth: 1951-08-19 Referring Provider (PT): Dr. Astrid Divine   Encounter Date: 03/26/2021   PT End of Session - 03/26/21 0930     Visit Number 8    Number of Visits 17    Date for PT Re-Evaluation 04/16/21    Authorization Type eval: 02/19/21    PT Start Time 0800    PT Stop Time 0849    PT Time Calculation (min) 49 min    Activity Tolerance Patient tolerated treatment well    Behavior During Therapy [facility] for tasks assessed/performed             History reviewed. No pertinent past medical history.  History reviewed. No pertinent surgical history.  There were no vitals filed for this visit.   Subjective Assessment - 03/26/21 0806     Subjective Pt reports that she is doing alright today. She states she has seen improvements since starting PT such as being able to bend over without an increase in pain. She states since yesterday she has had a "clicky thing" and fells like she needs to "shake out" her right leg every time she stands up. She rates her pain currently as 2/10. She has been performing her HEP and walking 2 miles/day.    Pertinent History Pt reports intermittent low back pain for multiple years. Normally occurs with excess activity such as gardening. Pt reports some numbness and tingling in her sacral area which started in February 2022. She has not had saddle anesthesia or bowel or bladder incontinence. She is normally very active but due to COVID restrictions has been less active and thinks this has contributed. She states she was diagnosed with piriformis syndrome by her MD and was put on a 7 day prednisone taper as well as gabapentin. She states the prednisone did not help. She has gradually increased her gabapentin and does report that  this has been helpful. She currently gets radicular symptoms down her RLE. She had plain film radiographs of the lumbar spine which showed moderate to severe fecal loading throughout the length of the colon. Trace retrolisthesis of L3 versus L4. Trace retrolisthesis of L2 versus L3. No other malalignment. No fractures. Multilevel degenerative disc disease. Lower lumbar facet degenerative changes. Calcified atherosclerosis in the abdominal aorta. To help with the fecal burden she initially tried prune juice without relief so she used magnesium citrate which resolved the constipation. No improvement in the back pain once constipation resolved. She does have a history of breast cancer 12 years ago with lumpectomy and radiation. Released by oncology and now just has regular mammograms. She has a bone density test scheduled this year due to a history of estrogen blockers after her breast cancer.    How long can you sit comfortably? 1-2 hours    How long can you stand comfortably? 1-2 hours    Diagnostic tests See history    Patient Stated Goals Pt would like to be able to perform sit to stand and walk 10,000 steps without pain. Carry groceries without pain;    Currently in Pain? Yes    Pain Score 2     Pain Location Hip    Pain Orientation Right;Proximal    Pain Descriptors / Indicators Tightness;Discomfort;Other (Comment)   clicking   Pain Type Chronic pain  Pain Onset More than a month ago               TREATMENT     Manual Therapy  Prone moist heat pack to posterior R hip during interim history x 5 minutes; Prone STM to posterior R hip including glut max, med, min, and deep external rotators, utilized Theraband roller and Hypervolt; Prone CPA and right UPA mobilizations, L2-5, grade 2-3, 30 seconds/bout x 3 bouts/level; Supine right single knee-to-chest stretch x 45 seconds; Supine right hip FABER and FADIR stretches x 45 seconds each; Supine right hip long axis distraction 30 seconds x  3;     Ther-ex  Prone straight leg hip extension 3# ankle weights 2 x 10 BLE (tactile cueing); Prone HS curls with 3# ankle weights 2 x 10 BLE; Hooklying posterior pelvic tilts x 10; Hooklying posterior pelvic tilts with alternating marches 3# AW x 10 BLE; Hooklying posterior pelvic tilts with SLR 3# AW x 10 BLE;     Pt educated throughout session about proper posture and technique with exercises. Improved exercise technique, movement at target joints, use of target muscles after min to mod verbal, visual, tactile cues.      Patient arrives with excellent motivation to participate in physical therapy. Continued manual techniques for central and right lower back and right posterior hip due to success with decreasing pain in previous sessions. Progressed hooklying exercises with 3# AW. Patient noted no pain with any of the exercises. She does state the catching/clicking feeling in the right hip remains at the end of the session. She demonstrates weakness in R hip during prone hip extension exercise, requiring heavy tactile assist to prevent pelvic rotation. Gait was observed at end of session. PT educated on laterality of walking stick or cane to reduce gait imbalances. Also educated to decrease walking distance from 2 miles to 1 mile and note any differences in the hip over the next few days. Next session, pt could progress in weight with HS curl. Pt will benefit from PT services to address deficits in strength and pain in order to return to full function at home.          PT Short Term Goals - 02/19/21 1129       PT SHORT TERM GOAL #1   Title Pt will be independent with HEP in order to improve strength and decrease back/hip pain in order to improve pain-free function at home.    Time 4    Period Weeks    Status New    Target Date 03/19/21               PT Long Term Goals - 02/19/21 1130       PT LONG TERM GOAL #1   Title Pt will decrease mODI score by at least 13 points in  order demonstrate clinically significant reduction in back pain/disability.    Baseline 02/19/21: 36%    Time 8    Period Weeks    Status New    Target Date 04/16/21      PT LONG TERM GOAL #2   Title Pt will improve FOTO to at least 59 in order to demonstrate significant improvement in function related to her R low back/posterior hip pain    Baseline 02/19/21: 37    Time 8    Period Weeks    Status New    Target Date 04/16/21      PT LONG TERM GOAL #3   Title Pt will  decrease worst back/R posterior hip pain as reported on NPRS by at least 3 points in order to demonstrate clinically significant reduction in back pain.    Baseline 02/19/21: 8/10    Time 8    Period Weeks    Status New    Target Date 04/16/21      PT LONG TERM GOAL #4   Title Pt will increase bilateral hip abduction and extension strength of by at least 1/2 MMT grade in order to demonstrate improvement in strength and function.    Baseline 02/19/21: 4-/5 bilaterally for both abduction and extension    Time 8    Period Weeks    Status New    Target Date 04/16/21                   Plan - 03/26/21 0931     Clinical Impression Statement Patient arrives with excellent motivation to participate in physical therapy. Continued manual techniques for central and right lower back and right posterior hip due to success with decreasing pain in previous sessions. Progressed hooklying exercises with 3# AW. Patient noted no pain with any of the exercises. She does state the catching/clicking feeling in the right hip remains at the end of the session. She demonstrates weakness in R hip during prone hip extension exercise, requiring heavy tactile assist to prevent pelvic rotation. Gait was observed at end of session. PT educated on laterality of walking stick or cane to reduce gait imbalances. Also educated to decrease walking distance from 2 miles to 1 mile and note any differences in the hip over the next few days. Next session,  pt could progress in weight with HS curl. Pt will benefit from PT services to address deficits in strength and pain in order to return to full function at home.    Personal Factors and Comorbidities Age;Time since onset of injury/illness/exacerbation;Comorbidity 2    Comorbidities DDD, breast CA    Examination-Activity Limitations Transfers;Locomotion Level    Examination-Participation Restrictions Community Activity;Yard Work    Stability/Clinical Decision Making Stable/Uncomplicated    Rehab Potential Good    PT Frequency 2x / week    PT Duration 8 weeks    PT Treatment/Interventions ADLs/Self Care Home Management;Aquatic Therapy;Biofeedback;Canalith Repostioning;Cryotherapy;Electrical Stimulation;Iontophoresis 4mg /ml Dexamethasone;Moist Heat;Traction;Ultrasound;Gait training;Functional mobility training;Therapeutic activities;Therapeutic exercise;Balance training;Neuromuscular re-education;Manual techniques;Patient/family education;Passive range of motion;Dry needling;Vestibular;Spinal Manipulations;Joint Manipulations    PT Next Visit Plan Continue mobilzations of lower lumbar spine, STM to R posterior hip, review HEP, TDN    PT Home Exercise Plan Access Code: [email or web address]    Consulted and Agree with Plan of Care Patient             Patient will benefit from skilled therapeutic intervention in order to improve the following deficits and impairments:  Abnormal gait, Pain, Decreased strength  Visit Diagnosis: Chronic right-sided low back pain with right-sided sciatica  Muscle weakness (generalized)  Pain in right hip     Problem List There are no problems to display for this patient.  Patrina Levering PT, DPT  Ramonita Lab 03/26/2021, 9:36 AM  [place] [facility] [address] [place], [place], [address] Phone: [phone]   Fax:  [phone]  Name: Amber Blevins MRN: [id number] Date of Birth: 1951/01/11

## 2021-04-02 ENCOUNTER — Other Ambulatory Visit: Payer: Self-pay

## 2021-04-02 ENCOUNTER — Ambulatory Visit: Payer: Medicare PPO | Attending: Family Medicine

## 2021-04-02 DIAGNOSIS — M5441 Lumbago with sciatica, right side: Secondary | ICD-10-CM | POA: Insufficient documentation

## 2021-04-02 DIAGNOSIS — G8929 Other chronic pain: Secondary | ICD-10-CM | POA: Diagnosis present

## 2021-04-02 DIAGNOSIS — M6281 Muscle weakness (generalized): Secondary | ICD-10-CM

## 2021-04-02 DIAGNOSIS — M25551 Pain in right hip: Secondary | ICD-10-CM | POA: Diagnosis present

## 2021-04-02 NOTE — Therapy (Signed)
[facility] [address] [place], [place], [address] Phone: [phone]   Fax:  [phone]  Physical Therapy Treatment  Patient Details  Name: Amber Blevins MRN: [id number] Date of Birth: September 01, 1951 Referring Provider (PT): Dr. Astrid Divine   Encounter Date: 04/02/2021   PT End of Session - 04/02/21 1051     Visit Number 9    Number of Visits 17    Date for PT Re-Evaluation 04/16/21    Authorization Type eval: 02/19/21    PT Start Time 1100    PT Stop Time 1145    PT Time Calculation (min) 45 min    Activity Tolerance Patient tolerated treatment well    Behavior During Therapy [facility] for tasks assessed/performed             History reviewed. No pertinent past medical history.  History reviewed. No pertinent surgical history.  There were no vitals filed for this visit.   Subjective Assessment - 04/02/21 1050     Subjective Pt reports that she is doing well today. She states that she continues to have mild R SIJ pain and occassional R posterior hip pain but it has significantly improved since starting therapy. She is also able to bend over much closer to the floor now. The click she was having in her hip has resolved. She rates her chronic R hip pain as 1-2/10 currently . She has been performing her HEP and walking every day.    Pertinent History Pt reports intermittent low back pain for multiple years. Normally occurs with excess activity such as gardening. Pt reports some numbness and tingling in her sacral area which started in February 2022. She has not had saddle anesthesia or bowel or bladder incontinence. She is normally very active but due to COVID restrictions has been less active and thinks this has contributed. She states she was diagnosed with piriformis syndrome by her MD and was put on a 7 day prednisone taper as well as gabapentin. She states the prednisone did not help. She has gradually increased her gabapentin and  does report that this has been helpful. She currently gets radicular symptoms down her RLE. She had plain film radiographs of the lumbar spine which showed moderate to severe fecal loading throughout the length of the colon. Trace retrolisthesis of L3 versus L4. Trace retrolisthesis of L2 versus L3. No other malalignment. No fractures. Multilevel degenerative disc disease. Lower lumbar facet degenerative changes. Calcified atherosclerosis in the abdominal aorta. To help with the fecal burden she initially tried prune juice without relief so she used magnesium citrate which resolved the constipation. No improvement in the back pain once constipation resolved. She does have a history of breast cancer 12 years ago with lumpectomy and radiation. Released by oncology and now just has regular mammograms. She has a bone density test scheduled this year due to a history of estrogen blockers after her breast cancer.    How long can you sit comfortably? 1-2 hours    How long can you stand comfortably? 1-2 hours    Diagnostic tests See history    Patient Stated Goals Pt would like to be able to perform sit to stand and walk 10,000 steps without pain. Carry groceries without pain;                  TREATMENT     Manual Therapy  Prone moist heat pack to posterior R hip during interim history x 5 minutes; Prone STM  to posterior R hip including glut max, med, min, and deep external rotators, utilized Theraband roller and Hypervolt; Prone CPA and right UPA mobilizations, L2-5, grade 2-3, 30 seconds/bout x 3 bouts/level;   Ther-ex  Prone straight leg hip extension 3# ankle weights 2 x 10 BLE (tactile cueing); Prone HS curls with 3# ankle weights 2 x 10 BLE; Hooklying lumbar rocking x 10 BLE; Hooklying posterior pelvic tilts with SLR 3# AW 2 x 20 BLE; Hooklying bridges 2 x 10; Left side-lying right hip straight leg abduction with 3 pound ankle weight 2x10; Left side-lying right clam with manual  resistance from therapist 2x10;    Pt educated throughout session about proper posture and technique with exercises. Improved exercise technique, movement at target joints, use of target muscles after min to mod verbal, visual, tactile cues.      Patient arrives with excellent motivation to participate in physical therapy.  Continued manual techniques for central and right lower back and right posterior hip due to success with decreasing pain in previous sessions.  She is able to complete all prone,  hooklying, and side-lying exercises without any increase in her pain today.  Will update outcome measures and goals the next session including a progress note.  Will also review and update HEP as needed.  Pt will benefit from PT services to address deficits in strength and pain in order to return to full function at home.                    PT Short Term Goals - 02/19/21 1129       PT SHORT TERM GOAL #1   Title Pt will be independent with HEP in order to improve strength and decrease back/hip pain in order to improve pain-free function at home.    Time 4    Period Weeks    Status New    Target Date 03/19/21               PT Long Term Goals - 02/19/21 1130       PT LONG TERM GOAL #1   Title Pt will decrease mODI score by at least 13 points in order demonstrate clinically significant reduction in back pain/disability.    Baseline 02/19/21: 36%    Time 8    Period Weeks    Status New    Target Date 04/16/21      PT LONG TERM GOAL #2   Title Pt will improve FOTO to at least 59 in order to demonstrate significant improvement in function related to her R low back/posterior hip pain    Baseline 02/19/21: 37    Time 8    Period Weeks    Status New    Target Date 04/16/21      PT LONG TERM GOAL #3   Title Pt will decrease worst back/R posterior hip pain as reported on NPRS by at least 3 points in order to demonstrate clinically significant reduction in back pain.     Baseline 02/19/21: 8/10    Time 8    Period Weeks    Status New    Target Date 04/16/21      PT LONG TERM GOAL #4   Title Pt will increase bilateral hip abduction and extension strength of by at least 1/2 MMT grade in order to demonstrate improvement in strength and function.    Baseline 02/19/21: 4-/5 bilaterally for both abduction and extension    Time 8  Period Weeks    Status New    Target Date 04/16/21                   Plan - 04/02/21 1051     Clinical Impression Statement Patient arrives with excellent motivation to participate in physical therapy.  Continued manual techniques for central and right lower back and right posterior hip due to success with decreasing pain in previous sessions.  She is able to complete all prone,  hooklying, and side-lying exercises without any increase in her pain today.  Will update outcome measures and goals the next session including a progress note.  Will also review and update HEP as needed.  Pt will benefit from PT services to address deficits in strength and pain in order to return to full function at home.    Personal Factors and Comorbidities Age;Time since onset of injury/illness/exacerbation;Comorbidity 2    Comorbidities DDD, breast CA    Examination-Activity Limitations Transfers;Locomotion Level    Examination-Participation Restrictions Community Activity;Yard Work    Stability/Clinical Decision Making Stable/Uncomplicated    Rehab Potential Good    PT Frequency 2x / week    PT Duration 8 weeks    PT Treatment/Interventions ADLs/Self Care Home Management;Aquatic Therapy;Biofeedback;Canalith Repostioning;Cryotherapy;Electrical Stimulation;Iontophoresis 4mg /ml Dexamethasone;Moist Heat;Traction;Ultrasound;Gait training;Functional mobility training;Therapeutic activities;Therapeutic exercise;Balance training;Neuromuscular re-education;Manual techniques;Patient/family education;Passive range of motion;Dry needling;Vestibular;Spinal  Manipulations;Joint Manipulations    PT Next Visit Plan Update outcome measures, goals, progress note; continue mobilzations of lower lumbar spine, STM to R posterior hip, review HEP, TDN    PT Home Exercise Plan Access Code: [email or web address]    Consulted and Agree with Plan of Care Patient             Patient will benefit from skilled therapeutic intervention in order to improve the following deficits and impairments:  Abnormal gait, Pain, Decreased strength  Visit Diagnosis: Chronic right-sided low back pain with right-sided sciatica  Muscle weakness (generalized)     Problem List There are no problems to display for this patient.  Lyndel Safe Natlie Asfour PT, DPT, GCS  Artyom Stencel 04/02/2021, 12:27 PM  [place] [address] [place], [place], [address] Phone: [phone]   Fax:  [phone]  Name: Diksha Tagliaferro MRN: [id number] Date of Birth: 1951/05/08

## 2021-04-07 ENCOUNTER — Ambulatory Visit: Payer: Medicare PPO

## 2021-04-07 ENCOUNTER — Other Ambulatory Visit: Payer: Self-pay

## 2021-04-07 DIAGNOSIS — M6281 Muscle weakness (generalized): Secondary | ICD-10-CM

## 2021-04-07 DIAGNOSIS — G8929 Other chronic pain: Secondary | ICD-10-CM

## 2021-04-07 DIAGNOSIS — M5441 Lumbago with sciatica, right side: Secondary | ICD-10-CM | POA: Diagnosis not present

## 2021-04-07 NOTE — Therapy (Signed)
[facility] [address] [place], [place], [address] Phone: [phone]   Fax:  [phone]  Physical Therapy Progress Note  Dates of reporting period  02/19/21   to   04/07/21  Patient Details  Name: Amber Blevins MRN: [id number] Date of Birth: 1950/11/05 Referring Provider (PT): Dr. Astrid Divine   Encounter Date: 04/07/2021   PT End of Session - 04/07/21 0856     Visit Number 10    Number of Visits 17    Date for PT Re-Evaluation 04/16/21    Authorization Type eval: 02/19/21    PT Start Time 0845    PT Stop Time 0930    PT Time Calculation (min) 45 min    Activity Tolerance Patient tolerated treatment well    Behavior During Therapy [facility] for tasks assessed/performed             History reviewed. No pertinent past medical history.  History reviewed. No pertinent surgical history.  There were no vitals filed for this visit.   Subjective Assessment - 04/07/21 0855     Subjective Pt reports that she is doing alright today but she is feeling frustrated. She was very active on Thursday afternoon and her back pain was very limiting, keeping her from completing all the things she wanted to do. Her sharp pain has not been as severe but she continues to have some "zinging" pain in her low back. Her R posterior hip has not been very painful lately. She rates her chronic low back pain as 2/10 currently . She has been performing her HEP and walking every day.    Pertinent History Pt reports intermittent low back pain for multiple years. Normally occurs with excess activity such as gardening. Pt reports some numbness and tingling in her sacral area which started in February 2022. She has not had saddle anesthesia or bowel or bladder incontinence. She is normally very active but due to COVID restrictions has been less active and thinks this has contributed. She states she was diagnosed with piriformis syndrome by her MD and was put on a 7 day  prednisone taper as well as gabapentin. She states the prednisone did not help. She has gradually increased her gabapentin and does report that this has been helpful. She currently gets radicular symptoms down her RLE. She had plain film radiographs of the lumbar spine which showed moderate to severe fecal loading throughout the length of the colon. Trace retrolisthesis of L3 versus L4. Trace retrolisthesis of L2 versus L3. No other malalignment. No fractures. Multilevel degenerative disc disease. Lower lumbar facet degenerative changes. Calcified atherosclerosis in the abdominal aorta. To help with the fecal burden she initially tried prune juice without relief so she used magnesium citrate which resolved the constipation. No improvement in the back pain once constipation resolved. She does have a history of breast cancer 12 years ago with lumpectomy and radiation. Released by oncology and now just has regular mammograms. She has a bone density test scheduled this year due to a history of estrogen blockers after her breast cancer.    How long can you sit comfortably? 1-2 hours    How long can you stand comfortably? 1-2 hours    Diagnostic tests See history    Patient Stated Goals Pt would like to be able to perform sit to stand and walk 10,000 steps without pain. Carry groceries without pain;  TREATMENT     Manual Therapy  Prone moist heat pack to posterior R hip during interim history x 5 minutes; Prone CPA and right UPA mobilizations, L2-S1 grade 2-3, 30 seconds/bout x 3 bouts/level;   Ther-ex  Updated outcome measures and goals with patient: mODI: 36% FOTO: 50 Worst pain: 8/10 Hip strength: 4-/5 bilaterally for extension, 4/5 for abduction Prone straight leg hip extension manual resistance 2 x 10 BLE; Hooklying posterior pelvic tilts with SLR 3# AW 2 x 20 BLE; Hooklying bridges 2 x 10, cues to minimize lumbar activation; Side-lying straight leg hip abduction 2x10  BLE Side-lying clams with manual resistance from therapist 2x10 BLE; Sit to stand from mat table holding 6# med ball against chest x 10;    Pt educated throughout session about proper posture and technique with exercises. Improved exercise technique, movement at target joints, use of target muscles after min to mod verbal, visual, tactile cues.      Patient arrives with excellent motivation to participate in physical therapy.  Updated outcome measures and goals with patient today.  Her modified Oswestry Disability Index is unchanged from the initial evaluation however her FOTO score improved to 50 today.  Her worst pain remains 8/10 however pain now is mostly localized in the low back and minimally in the right posterior hip.  She still has occasional radicular symptoms down her right lower extremity.  Her hip abduction strength has improved bilaterally but her hip extension strength remains unchanged.  Patient is frustrated today regarding her back pain and the limitations it is causing her functional activities. Encouraged her to schedule follow-up appointment with her referring provider to discuss possibility of further imaging and plan of care moving forward.  Continued manual techniques for central and right lower back and right posterior hip today. She is able to complete all prone,  hooklying, and side-lying exercises without any increase in her pain today.  Will include more squatting exercises in future sessions as this is one of the more painful activities for her at home.  Will also review and update HEP as needed.  Pt will benefit from PT services to address deficits in strength and pain in order to return to full function at home.                    PT Short Term Goals - 04/07/21 0857       PT SHORT TERM GOAL #1   Title Pt will be independent with HEP in order to improve strength and decrease back/hip pain in order to improve pain-free function at home.    Time 4    Period  Weeks    Status Achieved               PT Long Term Goals - 04/07/21 0858       PT LONG TERM GOAL #1   Title Pt will decrease mODI score by at least 13 points in order demonstrate clinically significant reduction in back pain/disability.    Baseline 02/19/21: 36%; 04/07/21: 36%    Time 8    Period Weeks    Status On-going    Target Date 04/16/21      PT LONG TERM GOAL #2   Title Pt will improve FOTO to at least 59 in order to demonstrate significant improvement in function related to her R low back/posterior hip pain    Baseline 02/19/21: 37; 04/07/21: 50    Time 8    Period Weeks  Status Partially Met    Target Date 04/16/21      PT LONG TERM GOAL #3   Title Pt will decrease worst back/R posterior hip pain as reported on NPRS by at least 3 points in order to demonstrate clinically significant reduction in back pain.    Baseline 02/19/21: 8/10; 04/07/21: 8/10 (only in the back now and not hip);    Time 8    Period Weeks    Status On-going    Target Date 04/16/21      PT LONG TERM GOAL #4   Title Pt will increase bilateral hip abduction and extension strength of by at least 1/2 MMT grade in order to demonstrate improvement in strength and function.    Baseline 02/19/21: 4-/5 bilaterally for both abduction and extension; 04/07/21: 4/5 bilaterally for BLE abduction and 4-/5 for extension;    Time 8    Period Weeks    Status Partially Met    Target Date 04/16/21                   Plan - 04/07/21 0857     Clinical Impression Statement Patient arrives with excellent motivation to participate in physical therapy.  Updated outcome measures and goals with patient today.  Her modified Oswestry Disability Index is unchanged from the initial evaluation however her FOTO score improved to 50 today.  Her worst pain remains 8/10 however pain now is mostly localized in the low back and minimally in the right posterior hip.  She still has occasional radicular symptoms down her right  lower extremity.  Her hip abduction strength has improved bilaterally but her hip extension strength remains unchanged.  Patient is frustrated today regarding her back pain and the limitations it is causing her functional activities. Encouraged her to schedule follow-up appointment with her referring provider to discuss possibility of further imaging and plan of care moving forward.  Continued manual techniques for central and right lower back and right posterior hip today. She is able to complete all prone,  hooklying, and side-lying exercises without any increase in her pain today.  Will include more squatting exercises in future sessions as this is one of the more painful activities for her at home.  Will also review and update HEP as needed.  Pt will benefit from PT services to address deficits in strength and pain in order to return to full function at home.    Personal Factors and Comorbidities Age;Time since onset of injury/illness/exacerbation;Comorbidity 2    Comorbidities DDD, breast CA    Examination-Activity Limitations Transfers;Locomotion Level    Examination-Participation Restrictions Community Activity;Yard Work    Stability/Clinical Decision Making Stable/Uncomplicated    Rehab Potential Good    PT Frequency 2x / week    PT Duration 8 weeks    PT Treatment/Interventions ADLs/Self Care Home Management;Aquatic Therapy;Biofeedback;Canalith Repostioning;Cryotherapy;Electrical Stimulation;Iontophoresis 22m/ml Dexamethasone;Moist Heat;Traction;Ultrasound;Gait training;Functional mobility training;Therapeutic activities;Therapeutic exercise;Balance training;Neuromuscular re-education;Manual techniques;Patient/family education;Passive range of motion;Dry needling;Vestibular;Spinal Manipulations;Joint Manipulations    PT Next Visit Plan continue mobilzations of lower lumbar spine, STM to R posterior hip, review HEP, TDN    PT Home Exercise Plan Access Code: [email or web address]    Consulted and Agree with  Plan of Care Patient             Patient will benefit from skilled therapeutic intervention in order to improve the following deficits and impairments:  Abnormal gait, Pain, Decreased strength  Visit Diagnosis: Chronic right-sided low back pain with right-sided sciatica  Muscle weakness (generalized)  Problem List There are no problems to display for this patient.  Lyndel Safe Amber Blevins PT, DPT, GCS  Amber Blevins 04/07/2021, 10:43 AM  [facility] [address] [place], [place], [address] Phone: [phone]   Fax:  [phone]  Name: Amber Blevins MRN: [id number] Date of Birth: 1950/10/16

## 2021-04-09 ENCOUNTER — Ambulatory Visit: Payer: Medicare PPO

## 2021-04-09 ENCOUNTER — Other Ambulatory Visit: Payer: Self-pay

## 2021-04-09 DIAGNOSIS — M5441 Lumbago with sciatica, right side: Secondary | ICD-10-CM

## 2021-04-09 DIAGNOSIS — G8929 Other chronic pain: Secondary | ICD-10-CM

## 2021-04-09 NOTE — Therapy (Signed)
[facility] [address] [place], [place], [address] Phone: [phone]   Fax:  [phone]  Physical Therapy Treatment   Patient Details  Name: Amber Blevins MRN: [id number] Date of Birth: 07/10/1951 Referring Provider (PT): Dr. Astrid Divine   Encounter Date: 04/09/2021   PT End of Session - 04/09/21 1148     Visit Number 11    Number of Visits 17    Date for PT Re-Evaluation 04/16/21    Authorization Type eval: 02/19/21    PT Start Time 1145    PT Stop Time 1230    PT Time Calculation (min) 45 min    Activity Tolerance Patient tolerated treatment well    Behavior During Therapy [facility] for tasks assessed/performed             History reviewed. No pertinent past medical history.  History reviewed. No pertinent surgical history.  There were no vitals filed for this visit.   Subjective Assessment - 04/09/21 1148     Subjective Pt reports that she is doing alright today.  She continues with intermittent chronic right low back/right posterior hip pain. She rates her chronic low back pain as 2/10 currently . She has been performing her HEP and walking but has had to limit her walking.  She was able to do some brief yard work outside yesterday.    Pertinent History Pt reports intermittent low back pain for multiple years. Normally occurs with excess activity such as gardening. Pt reports some numbness and tingling in her sacral area which started in February 2022. She has not had saddle anesthesia or bowel or bladder incontinence. She is normally very active but due to COVID restrictions has been less active and thinks this has contributed. She states she was diagnosed with piriformis syndrome by her MD and was put on a 7 day prednisone taper as well as gabapentin. She states the prednisone did not help. She has gradually increased her gabapentin and does report that this has been helpful. She currently gets radicular symptoms down her  RLE. She had plain film radiographs of the lumbar spine which showed moderate to severe fecal loading throughout the length of the colon. Trace retrolisthesis of L3 versus L4. Trace retrolisthesis of L2 versus L3. No other malalignment. No fractures. Multilevel degenerative disc disease. Lower lumbar facet degenerative changes. Calcified atherosclerosis in the abdominal aorta. To help with the fecal burden she initially tried prune juice without relief so she used magnesium citrate which resolved the constipation. No improvement in the back pain once constipation resolved. She does have a history of breast cancer 12 years ago with lumpectomy and radiation. Released by oncology and now just has regular mammograms. She has a bone density test scheduled this year due to a history of estrogen blockers after her breast cancer.    How long can you sit comfortably? 1-2 hours    How long can you stand comfortably? 1-2 hours    Diagnostic tests See history    Patient Stated Goals Pt would like to be able to perform sit to stand and walk 10,000 steps without pain. Carry groceries without pain;                  TREATMENT     Manual Therapy  Prone moist heat pack to posterior R hip during interim history x 5 minutes; Prone CPA and right UPA mobilizations, L2-S1 grade 2-3, 30 seconds/bout x 3 bouts/level; Prone right hip posterior and anterior  mobilizations, grade 3, 30 seconds/bout x2 bouts;   Ther-ex  Prone straight leg hip extension with 3# ankle weights (AW) 2 x 10 BLE; Prone bent knee hip extension with 3# AW 2 x 10 BLE; Prone HS curls with 3# AW 2 x 10 BLE; Hooklying posterior pelvic tilts with SLR 4# AW 2 x 10 BLE; Hooklying posterior pelvic tilts with marches 4# AW and alternating contralateral arm lifts 2 x 10 BLE; Hooklying lumbar rotation rocking 2 x 10 each direction; Hooklying ant/post pelvic tilts 3s hold 2 x 10;    Pt educated throughout session about proper posture and  technique with exercises. Improved exercise technique, movement at target joints, use of target muscles after min to mod verbal, visual, tactile cues.      Patient arrives with excellent motivation to participate in physical therapy. Continued manual techniques for central and right lower back and right posterior hip today. She is able to complete all prone, hooklying, and side-lying exercises without any increase in her pain today but does report low back fatigue at the end of the session. Will include more squatting exercises in future sessions as this is one of the more painful activities for her at home.  Will also review and update HEP as needed.  Pt will benefit from PT services to address deficits in strength and pain in order to return to full function at home.                    PT Short Term Goals - 04/07/21 0857       PT SHORT TERM GOAL #1   Title Pt will be independent with HEP in order to improve strength and decrease back/hip pain in order to improve pain-free function at home.    Time 4    Period Weeks    Status Achieved               PT Long Term Goals - 04/07/21 0858       PT LONG TERM GOAL #1   Title Pt will decrease mODI score by at least 13 points in order demonstrate clinically significant reduction in back pain/disability.    Baseline 02/19/21: 36%; 04/07/21: 36%    Time 8    Period Weeks    Status On-going    Target Date 04/16/21      PT LONG TERM GOAL #2   Title Pt will improve FOTO to at least 59 in order to demonstrate significant improvement in function related to her R low back/posterior hip pain    Baseline 02/19/21: 37; 04/07/21: 50    Time 8    Period Weeks    Status Partially Met    Target Date 04/16/21      PT LONG TERM GOAL #3   Title Pt will decrease worst back/R posterior hip pain as reported on NPRS by at least 3 points in order to demonstrate clinically significant reduction in back pain.    Baseline 02/19/21: 8/10; 04/07/21:  8/10 (only in the back now and not hip);    Time 8    Period Weeks    Status On-going    Target Date 04/16/21      PT LONG TERM GOAL #4   Title Pt will increase bilateral hip abduction and extension strength of by at least 1/2 MMT grade in order to demonstrate improvement in strength and function.    Baseline 02/19/21: 4-/5 bilaterally for both abduction and extension; 04/07/21: 4/5 bilaterally for  BLE abduction and 4-/5 for extension;    Time 8    Period Weeks    Status Partially Met    Target Date 04/16/21                   Plan - 04/09/21 1148     Clinical Impression Statement Patient arrives with excellent motivation to participate in physical therapy. Continued manual techniques for central and right lower back and right posterior hip today. She is able to complete all prone, hooklying, and side-lying exercises without any increase in her pain today but does report low back fatigue at the end of the session. Will include more squatting exercises in future sessions as this is one of the more painful activities for her at home.  Will also review and update HEP as needed.  Pt will benefit from PT services to address deficits in strength and pain in order to return to full function at home.    Personal Factors and Comorbidities Age;Time since onset of injury/illness/exacerbation;Comorbidity 2    Comorbidities DDD, breast CA    Examination-Activity Limitations Transfers;Locomotion Level    Examination-Participation Restrictions Community Activity;Yard Work    Stability/Clinical Decision Making Stable/Uncomplicated    Rehab Potential Good    PT Frequency 2x / week    PT Duration 8 weeks    PT Treatment/Interventions ADLs/Self Care Home Management;Aquatic Therapy;Biofeedback;Canalith Repostioning;Cryotherapy;Electrical Stimulation;Iontophoresis 16m/ml Dexamethasone;Moist Heat;Traction;Ultrasound;Gait training;Functional mobility training;Therapeutic activities;Therapeutic  exercise;Balance training;Neuromuscular re-education;Manual techniques;Patient/family education;Passive range of motion;Dry needling;Vestibular;Spinal Manipulations;Joint Manipulations    PT Next Visit Plan continue mobilzations of lower lumbar spine, STM to R posterior hip, review HEP, TDN    PT Home Exercise Plan Access Code: [email or web address]    Consulted and Agree with Plan of Care Patient             Patient will benefit from skilled therapeutic intervention in order to improve the following deficits and impairments:  Abnormal gait, Pain, Decreased strength  Visit Diagnosis: Chronic right-sided low back pain with right-sided sciatica     Problem List There are no problems to display for this patient.  JLyndel SafeHuprich PT, DPT, GCS  Dayle Mcnerney 04/09/2021, 1:41 PM  [facility] [place] [address]Phone: [phone]  Fax:  [phone] Name: Amber NunziataMRN: [id number] of Birth: 2May 27, 1952

## 2021-04-14 ENCOUNTER — Ambulatory Visit: Payer: Medicare PPO

## 2021-04-14 ENCOUNTER — Other Ambulatory Visit: Payer: Self-pay

## 2021-04-14 DIAGNOSIS — M25551 Pain in right hip: Secondary | ICD-10-CM

## 2021-04-14 DIAGNOSIS — M6281 Muscle weakness (generalized): Secondary | ICD-10-CM

## 2021-04-14 DIAGNOSIS — M5441 Lumbago with sciatica, right side: Secondary | ICD-10-CM | POA: Diagnosis not present

## 2021-04-14 DIAGNOSIS — G8929 Other chronic pain: Secondary | ICD-10-CM

## 2021-04-14 NOTE — Therapy (Signed)
[facility] [address] [place], [place], [address] Phone: [phone]   Fax:  [phone]  Physical Therapy Treatment   Patient Details  Name: Amber Blevins MRN: [id number] Date of Birth: 25-Nov-1950 Referring Provider (PT): Dr. Astrid Divine   Encounter Date: 04/14/2021   PT End of Session - 04/14/21 0848     Visit Number 12    Number of Visits 17    Date for PT Re-Evaluation 04/16/21    Authorization Type eval: 02/19/21    PT Start Time 0846    PT Stop Time 0930    PT Time Calculation (min) 44 min    Activity Tolerance Patient tolerated treatment well    Behavior During Therapy [facility] for tasks assessed/performed             History reviewed. No pertinent past medical history.  History reviewed. No pertinent surgical history.  There were no vitals filed for this visit.   Subjective Assessment - 04/14/21 0848     Subjective Pt reports that she is doing alright today.  She continues with intermittent chronic right low back/right posterior hip pain but no pain currently. She has been performing her HEP. She would like to know if she can progress her HEP.    Pertinent History Pt reports intermittent low back pain for multiple years. Normally occurs with excess activity such as gardening. Pt reports some numbness and tingling in her sacral area which started in February 2022. She has not had saddle anesthesia or bowel or bladder incontinence. She is normally very active but due to COVID restrictions has been less active and thinks this has contributed. She states she was diagnosed with piriformis syndrome by her MD and was put on a 7 day prednisone taper as well as gabapentin. She states the prednisone did not help. She has gradually increased her gabapentin and does report that this has been helpful. She currently gets radicular symptoms down her RLE. She had plain film radiographs of the lumbar spine which showed moderate to severe  fecal loading throughout the length of the colon. Trace retrolisthesis of L3 versus L4. Trace retrolisthesis of L2 versus L3. No other malalignment. No fractures. Multilevel degenerative disc disease. Lower lumbar facet degenerative changes. Calcified atherosclerosis in the abdominal aorta. To help with the fecal burden she initially tried prune juice without relief so she used magnesium citrate which resolved the constipation. No improvement in the back pain once constipation resolved. She does have a history of breast cancer 12 years ago with lumpectomy and radiation. Released by oncology and now just has regular mammograms. She has a bone density test scheduled this year due to a history of estrogen blockers after her breast cancer.    How long can you sit comfortably? 1-2 hours    How long can you stand comfortably? 1-2 hours    Diagnostic tests See history    Patient Stated Goals Pt would like to be able to perform sit to stand and walk 10,000 steps without pain. Carry groceries without pain;                  TREATMENT     Manual Therapy  Prone moist heat pack to posterior R hip during interim history x 5 minutes; Prone CPA and right UPA mobilizations, L2-S1 grade 2-3, 30 seconds/bout x 3 bouts/level; Prone right hip posterior and anterior mobilizations, grade 3, 30 seconds/bout x2 bouts;   Ther-ex  Prone straight leg hip extension with  3# ankle weights (AW) x 10 BLE; Prone bent knee hip extension with 3# AW x 10 BLE; Prone HS curls with 3# AW x 10 BLE; Hooklying posterior pelvic tilts with SLR 2 x 10 BLE: Hooklying lumbar rotation rocking 2 x 10 each direction; Sit to stand without UE support holding 4# med ball 2 x 10; Weighted 15# box lifts 2 x 5;    Pt educated throughout session about proper posture and technique with exercises. Improved exercise technique, movement at target joints, use of target muscles after min to mod verbal, visual, tactile cues.      Patient  arrives with excellent motivation to participate in physical therapy. Continued manual techniques for central and right lower back and right posterior hip today. She is able to complete all prone, hooklying, and side-lying exercises without any increase in her pain today. Included additional squatting exercises during session today. HEP updated today to included seated clams and sit to stands. Pt will benefit from PT services to address deficits in strength and pain in order to return to full function at home.                    PT Short Term Goals - 04/14/21 0851       PT SHORT TERM GOAL #1   Title Pt will be independent with HEP in order to improve strength and decrease back/hip pain in order to improve pain-free function at home.    Time 4    Period Weeks    Status Achieved               PT Long Term Goals - 04/14/21 0851       PT LONG TERM GOAL #1   Title Pt will decrease mODI score by at least 13 points in order demonstrate clinically significant reduction in back pain/disability.    Baseline 02/19/21: 36%; 04/07/21: 36%    Time 8    Period Weeks    Status On-going    Target Date 04/16/21      PT LONG TERM GOAL #2   Title Pt will improve FOTO to at least 59 in order to demonstrate significant improvement in function related to her R low back/posterior hip pain    Baseline 02/19/21: 37; 04/07/21: 50    Time 8    Period Weeks    Status Partially Met    Target Date 04/16/21      PT LONG TERM GOAL #3   Title Pt will decrease worst back/R posterior hip pain as reported on NPRS by at least 3 points in order to demonstrate clinically significant reduction in back pain.    Baseline 02/19/21: 8/10; 04/07/21: 8/10 (only in the back now and not hip);    Time 8    Period Weeks    Status On-going    Target Date 04/16/21      PT LONG TERM GOAL #4   Title Pt will increase bilateral hip abduction and extension strength of by at least 1/2 MMT grade in order to demonstrate  improvement in strength and function.    Baseline 02/19/21: 4-/5 bilaterally for both abduction and extension; 04/07/21: 4/5 bilaterally for BLE abduction and 4-/5 for extension;    Time 8    Period Weeks    Status Partially Met    Target Date 04/16/21                   Plan - 04/14/21 1540  Clinical Impression Statement Patient arrives with excellent motivation to participate in physical therapy. Continued manual techniques for central and right lower back and right posterior hip today. She is able to complete all prone, hooklying, and side-lying exercises without any increase in her pain today. Included additional squatting exercises during session today. HEP updated today to included seated clams and sit to stands. Pt will benefit from PT services to address deficits in strength and pain in order to return to full function at home.    Personal Factors and Comorbidities Age;Time since onset of injury/illness/exacerbation;Comorbidity 2    Comorbidities DDD, breast CA    Examination-Activity Limitations Transfers;Locomotion Level    Examination-Participation Restrictions Community Activity;Yard Work    Stability/Clinical Decision Making Stable/Uncomplicated    Rehab Potential Good    PT Frequency 2x / week    PT Duration 8 weeks    PT Treatment/Interventions ADLs/Self Care Home Management;Aquatic Therapy;Biofeedback;Canalith Repostioning;Cryotherapy;Electrical Stimulation;Iontophoresis 66m/ml Dexamethasone;Moist Heat;Traction;Ultrasound;Gait training;Functional mobility training;Therapeutic activities;Therapeutic exercise;Balance training;Neuromuscular re-education;Manual techniques;Patient/family education;Passive range of motion;Dry needling;Vestibular;Spinal Manipulations;Joint Manipulations    PT Next Visit Plan continue mobilzations of lower lumbar spine, STM to R posterior hip, review HEP, TDN    PT Home Exercise Plan Access Code: [email or web address]    Consulted and Agree with Plan of  Care Patient             Patient will benefit from skilled therapeutic intervention in order to improve the following deficits and impairments:  Abnormal gait, Pain, Decreased strength  Visit Diagnosis: Chronic right-sided low back pain with right-sided sciatica  Muscle weakness (generalized)  Pain in right hip     Problem List There are no problems to display for this patient.  JLyndel SafeHuprich PT, DPT, GCS  Tamesha Ellerbrock 04/14/2021, 10:31 AM  [facility] [place] [address]Phone: [phone]  Fax:  [phone] Name: JNeisha HingerMRN: [id number] of Birth: 2Oct 01, 1952

## 2021-04-16 ENCOUNTER — Other Ambulatory Visit: Payer: Self-pay

## 2021-04-16 ENCOUNTER — Ambulatory Visit: Payer: Medicare PPO

## 2021-04-16 DIAGNOSIS — M5441 Lumbago with sciatica, right side: Secondary | ICD-10-CM | POA: Diagnosis not present

## 2021-04-16 DIAGNOSIS — G8929 Other chronic pain: Secondary | ICD-10-CM

## 2021-04-16 DIAGNOSIS — M6281 Muscle weakness (generalized): Secondary | ICD-10-CM

## 2021-04-16 NOTE — Patient Instructions (Signed)
Access Code: [email or web address] URL: [email or web address] Date: 04/16/2021 Prepared by: Roxana Hires  Exercises Supine Piriformis Stretch with Leg Straight - 3 x daily - 7 x weekly - 3 reps - 45s hold Seated Piriformis Stretch (Mirrored) - 3 x daily - 7 x weekly - 3 reps - 45s hold Seated Figure 4 Piriformis Stretch (Mirrored) - 3 x daily - 7 x weekly - 3 reps - 45s hold Standing Lumbar Extension - 3 x daily - 7 x weekly - 2 sets - 10 reps - 5s hold Supine Lower Trunk Rotation - 1 x daily - 7 x weekly - 2 sets - 10 reps - 3s hold Supine Posterior Pelvic Tilt - 1 x daily - 3 x weekly - 2 sets - 10 reps - 5s hold Supine March with Posterior Pelvic Tilt - 1 x daily - 3 x weekly - 2 sets - 10 reps - 5s hold Supine Bridge - 1 x daily - 3 x weekly - 2 sets - 10 reps - 5s hold Sit to Stand Without Arm Support - 1 x daily - 3 x weekly - 2 sets - 10 reps Standing 3-Way Leg Reach with Resistance at Ankles and Counter Support - 1 x daily - 3 x weekly - 2 sets - 10 reps

## 2021-04-16 NOTE — Therapy (Signed)
[facility] [address] [place], [place], [address] Phone: [phone]   Fax:  [phone]  Physical Therapy Treatment   Patient Details  Name: Amber Blevins MRN: [id number] Date of Birth: 06/07/1951 Referring Provider (PT): Dr. Astrid Divine   Encounter Date: 04/16/2021   PT End of Session - 04/16/21 0853     Visit Number 13    Number of Visits 17    Date for PT Re-Evaluation 04/16/21    Authorization Type eval: 02/19/21    PT Start Time 0847    PT Stop Time 0930    PT Time Calculation (min) 43 min    Activity Tolerance Patient tolerated treatment well    Behavior During Therapy [facility] for tasks assessed/performed             History reviewed. No pertinent past medical history.  History reviewed. No pertinent surgical history.  There were no vitals filed for this visit.   Subjective Assessment - 04/16/21 0844     Subjective Pt reports that she is doing alright today.  She continues with intermittent chronic right low back/right posterior hip pain and rates her low back pain as 1-2/10 upon arrival today.  No right hip pain currently.  She has been performing her HEP. No specific questions or concerns.    Pertinent History Pt reports intermittent low back pain for multiple years. Normally occurs with excess activity such as gardening. Pt reports some numbness and tingling in her sacral area which started in February 2022. She has not had saddle anesthesia or bowel or bladder incontinence. She is normally very active but due to COVID restrictions has been less active and thinks this has contributed. She states she was diagnosed with piriformis syndrome by her MD and was put on a 7 day prednisone taper as well as gabapentin. She states the prednisone did not help. She has gradually increased her gabapentin and does report that this has been helpful. She currently gets radicular symptoms down her RLE. She had plain film radiographs of  the lumbar spine which showed moderate to severe fecal loading throughout the length of the colon. Trace retrolisthesis of L3 versus L4. Trace retrolisthesis of L2 versus L3. No other malalignment. No fractures. Multilevel degenerative disc disease. Lower lumbar facet degenerative changes. Calcified atherosclerosis in the abdominal aorta. To help with the fecal burden she initially tried prune juice without relief so she used magnesium citrate which resolved the constipation. No improvement in the back pain once constipation resolved. She does have a history of breast cancer 12 years ago with lumpectomy and radiation. Released by oncology and now just has regular mammograms. She has a bone density test scheduled this year due to a history of estrogen blockers after her breast cancer.    How long can you sit comfortably? 1-2 hours    How long can you stand comfortably? 1-2 hours    Diagnostic tests See history    Patient Stated Goals Pt would like to be able to perform sit to stand and walk 10,000 steps without pain. Carry groceries without pain;                  TREATMENT     Manual Therapy  Prone moist heat pack to posterior R hip during interim history x 5 minutes; Prone CPA and right UPA mobilizations, L2-S1 grade 2-3, 30 seconds/bout x 3 bouts/level;   Ther-ex  Prone straight leg hip extension 2 x 10 BLE; Prone bent  knee hip extension 2 x 10 BLE; Prone HS curls with manual resistance from therapist 2 x 10 BLE; Hooklying lumbar rotation rocking 2 x 10 each direction; Weighted 15# box lifts 2 x 10; Standing hip 3 ways with green tband around ankle x 10 each direction bilateral; HEP updated and issued to patient;    Pt educated throughout session about proper posture and technique with exercises. Improved exercise technique, movement at target joints, use of target muscles after min to mod verbal, visual, tactile cues.      Patient arrives with excellent motivation to  participate in physical therapy. Continued manual techniques for central and right lower back and right posterior hip today.  Continued with mat table strengthening but also progressed box lifts today and standing hip 3-way exercises.  HEP updated to include high-level strengthening and education provided to patient.  She will return for 2 additional appointments next week at which point she will be discharged to continue her home program independently.  She will need recertification to cover her final 2 visits. Pt will benefit from PT services to address deficits in strength and pain in order to return to full function at home.                    PT Short Term Goals - 04/14/21 0851       PT SHORT TERM GOAL #1   Title Pt will be independent with HEP in order to improve strength and decrease back/hip pain in order to improve pain-free function at home.    Time 4    Period Weeks    Status Achieved               PT Long Term Goals - 04/14/21 0851       PT LONG TERM GOAL #1   Title Pt will decrease mODI score by at least 13 points in order demonstrate clinically significant reduction in back pain/disability.    Baseline 02/19/21: 36%; 04/07/21: 36%    Time 8    Period Weeks    Status On-going    Target Date 04/16/21      PT LONG TERM GOAL #2   Title Pt will improve FOTO to at least 59 in order to demonstrate significant improvement in function related to her R low back/posterior hip pain    Baseline 02/19/21: 37; 04/07/21: 50    Time 8    Period Weeks    Status Partially Met    Target Date 04/16/21      PT LONG TERM GOAL #3   Title Pt will decrease worst back/R posterior hip pain as reported on NPRS by at least 3 points in order to demonstrate clinically significant reduction in back pain.    Baseline 02/19/21: 8/10; 04/07/21: 8/10 (only in the back now and not hip);    Time 8    Period Weeks    Status On-going    Target Date 04/16/21      PT LONG TERM GOAL #4    Title Pt will increase bilateral hip abduction and extension strength of by at least 1/2 MMT grade in order to demonstrate improvement in strength and function.    Baseline 02/19/21: 4-/5 bilaterally for both abduction and extension; 04/07/21: 4/5 bilaterally for BLE abduction and 4-/5 for extension;    Time 8    Period Weeks    Status Partially Met    Target Date 04/16/21  Plan - 04/16/21 0854     Clinical Impression Statement Patient arrives with excellent motivation to participate in physical therapy. Continued manual techniques for central and right lower back and right posterior hip today.  Continued with mat table strengthening but also progressed box lifts today and standing hip 3-way exercises.  HEP updated to include high-level strengthening and education provided to patient.  She will return for 2 additional appointments next week at which point she will be discharged to continue her home program independently.  She will need recertification to cover her final 2 visits. Pt will benefit from PT services to address deficits in strength and pain in order to return to full function at home.    Personal Factors and Comorbidities Age;Time since onset of injury/illness/exacerbation;Comorbidity 2    Comorbidities DDD, breast CA    Examination-Activity Limitations Transfers;Locomotion Level    Examination-Participation Restrictions Community Activity;Yard Work    Stability/Clinical Decision Making Stable/Uncomplicated    Rehab Potential Good    PT Frequency 2x / week    PT Duration 8 weeks    PT Treatment/Interventions ADLs/Self Care Home Management;Aquatic Therapy;Biofeedback;Canalith Repostioning;Cryotherapy;Electrical Stimulation;Iontophoresis 72m/ml Dexamethasone;Moist Heat;Traction;Ultrasound;Gait training;Functional mobility training;Therapeutic activities;Therapeutic exercise;Balance training;Neuromuscular re-education;Manual techniques;Patient/family  education;Passive range of motion;Dry needling;Vestibular;Spinal Manipulations;Joint Manipulations    PT Next Visit Plan continue mobilzations of lower lumbar spine, STM to R posterior hip, review HEP, TDN    PT Home Exercise Plan Access Code: [email or web address]    Consulted and Agree with Plan of Care Patient             Patient will benefit from skilled therapeutic intervention in order to improve the following deficits and impairments:  Abnormal gait, Pain, Decreased strength  Visit Diagnosis: Chronic right-sided low back pain with right-sided sciatica  Muscle weakness (generalized)     Problem List There are no problems to display for this patient.  Amber GroutPT, DPT, GCS  Amber Blevins 04/16/2021, 1:37 PM  [facility] [place] [place] [address]Phone: [phone]  Fax:  [phone] Name: Amber CorumMRN: [id number] of Birth: 202-27-1952

## 2021-04-21 ENCOUNTER — Ambulatory Visit: Payer: Medicare PPO

## 2021-04-21 ENCOUNTER — Other Ambulatory Visit: Payer: Self-pay

## 2021-04-21 DIAGNOSIS — M6281 Muscle weakness (generalized): Secondary | ICD-10-CM

## 2021-04-21 DIAGNOSIS — G8929 Other chronic pain: Secondary | ICD-10-CM

## 2021-04-21 DIAGNOSIS — M5441 Lumbago with sciatica, right side: Secondary | ICD-10-CM

## 2021-04-21 NOTE — Therapy (Signed)
[facility] [address] [place], [place], [address] Phone: [phone]   Fax:  [phone]  Physical Therapy Treatment/Recertification   Patient Details  Name: Amber Blevins MRN: [id number] Date of Birth: [birth year] Referring Provider (PT): Dr. Astrid Divine   Encounter Date: 04/21/2021   PT End of Session - 04/21/21 1320     Visit Number 14    Number of Visits 21    Date for PT Re-Evaluation 05/05/21    Authorization Type eval: 02/19/21    PT Start Time 1316    PT Stop Time 1400    PT Time Calculation (min) 44 min    Activity Tolerance Patient tolerated treatment well    Behavior During Therapy [facility] for tasks assessed/performed             History reviewed. No pertinent past medical history.  History reviewed. No pertinent surgical history.  There were no vitals filed for this visit.   Subjective Assessment - 04/21/21 1319     Subjective Pt reports that she is doing alright today.  She continues with intermittent chronic right low back/right posterior hip pain and rates her low back pain as 4/10 upon arrival today. She has been performing her HEP.  She has an appointment to see her primary care doctor this afternoon and returns for her final physical therapy session on Wednesday.  No specific questions or concerns.    Pertinent History Pt reports intermittent low back pain for multiple years. Normally occurs with excess activity such as gardening. Pt reports some numbness and tingling in her sacral area which started in February 2022. She has not had saddle anesthesia or bowel or bladder incontinence. She is normally very active but due to COVID restrictions has been less active and thinks this has contributed. She states she was diagnosed with piriformis syndrome by her MD and was put on a 7 day prednisone taper as well as gabapentin. She states the prednisone did not help. She has gradually increased her gabapentin and does  report that this has been helpful. She currently gets radicular symptoms down her RLE. She had plain film radiographs of the lumbar spine which showed moderate to severe fecal loading throughout the length of the colon. Trace retrolisthesis of L3 versus L4. Trace retrolisthesis of L2 versus L3. No other malalignment. No fractures. Multilevel degenerative disc disease. Lower lumbar facet degenerative changes. Calcified atherosclerosis in the abdominal aorta. To help with the fecal burden she initially tried prune juice without relief so she used magnesium citrate which resolved the constipation. No improvement in the back pain once constipation resolved. She does have a history of breast cancer 12 years ago with lumpectomy and radiation. Released by oncology and now just has regular mammograms. She has a bone density test scheduled this year due to a history of estrogen blockers after her breast cancer.    How long can you sit comfortably? 1-2 hours    How long can you stand comfortably? 1-2 hours    Diagnostic tests See history    Patient Stated Goals Pt would like to be able to perform sit to stand and walk 10,000 steps without pain. Carry groceries without pain;                TREATMENT     Manual Therapy  Prone moist heat pack to posterior R hip during interim history x 5 minutes; Prone CPA and right UPA mobilizations, L2-S1 grade 2-3, 30 seconds/bout x 3 bouts/level;  Ther-ex  Prone straight leg hip extension with 3# ankle weights (AW) 2 x 10 BLE; Prone bent knee hip extension with 3# AW 2 x 10 BLE; Prone HS curls with 3# AW 2 x 10 BLE; Sidelying straight leg hip abduction with 3# AW 2 x 10 BLE; Hooklying SLR with 3# AW 2 x 10 BLE: Hookling bridges x 10; Hooklying lumbar rotation rocking  x 10 each direction; Weighted 25# box lifts 2 x 5, verbal cues for proper technique as well as exhale during lift for transverse abdominis contraction;    Pt educated throughout session about  proper posture and technique with exercises. Improved exercise technique, movement at target joints, use of target muscles after min to mod verbal, visual, tactile cues.      Patient arrives with excellent motivation to participate in physical therapy. Continued manual techniques for central and right lower back and right posterior hip today.  Continued with mat table strengthening but also progressed box lifts today with higher weight. Updated outcome measures and goals with patient during visit on 04/07/2021.  Her modified Oswestry Disability Index was unchanged from the initial evaluation however her FOTO score improved to 50. Her worst pain remains 8/10 however pain now is mostly localized in the low back and minimally in the right posterior hip.  She still has occasional radicular symptoms down her right lower extremity.  Her hip abduction strength has improved bilaterally but her hip extension strength remains unchanged.  Patient has return visit with her primary care provider this afternoon and she will discuss additional options including additional imaging and possibly referral to physiatry.  She will return for 1 additional visit on Wednesday at which time HEP will be reviewed and patient will be discharged.                   PT Short Term Goals - 04/22/21 0900       PT SHORT TERM GOAL #1   Title Pt will be independent with HEP in order to improve strength and decrease back/hip pain in order to improve pain-free function at home.    Time 4    Period Weeks    Status Achieved               PT Long Term Goals - 04/22/21 0900       PT LONG TERM GOAL #1   Title Pt will decrease mODI score by at least 13 points in order demonstrate clinically significant reduction in back pain/disability.    Baseline 02/19/21: 36%; 04/07/21: 36%    Time 8    Period Weeks    Status On-going    Target Date 05/05/21      PT LONG TERM GOAL #2   Title Pt will improve FOTO to at least 59 in  order to demonstrate significant improvement in function related to her R low back/posterior hip pain    Baseline 02/19/21: 37; 04/07/21: 50    Time 8    Period Weeks    Status Partially Met    Target Date 05/05/21      PT LONG TERM GOAL #3   Title Pt will decrease worst back/R posterior hip pain as reported on NPRS by at least 3 points in order to demonstrate clinically significant reduction in back pain.    Baseline 02/19/21: 8/10; 04/07/21: 8/10 (only in the back now and not hip);    Time 8    Period Weeks    Status On-going  Target Date 05/05/21      PT LONG TERM GOAL #4   Title Pt will increase bilateral hip abduction and extension strength of by at least 1/2 MMT grade in order to demonstrate improvement in strength and function.    Baseline 02/19/21: 4-/5 bilaterally for both abduction and extension; 04/07/21: 4/5 bilaterally for BLE abduction and 4-/5 for extension;    Time 8    Period Weeks    Status Partially Met    Target Date 05/05/21                   Plan - 04/21/21 1320     Clinical Impression Statement Patient arrives with excellent motivation to participate in physical therapy. Continued manual techniques for central and right lower back and right posterior hip today.  Continued with mat table strengthening but also progressed box lifts today with higher weight. Updated outcome measures and goals with patient during visit on 04/07/2021.  Her modified Oswestry Disability Index was unchanged from the initial evaluation however her FOTO score improved to 50. Her worst pain remains 8/10 however pain now is mostly localized in the low back and minimally in the right posterior hip.  She still has occasional radicular symptoms down her right lower extremity.  Her hip abduction strength has improved bilaterally but her hip extension strength remains unchanged.  Patient has return visit with her primary care provider this afternoon and she will discuss additional options  including additional imaging and possibly referral to physiatry.  She will return for 1 additional visit on Wednesday at which time HEP will be reviewed and patient will be discharged.    Personal Factors and Comorbidities Age;Time since onset of injury/illness/exacerbation;Comorbidity 2    Comorbidities DDD, breast CA    Examination-Activity Limitations Transfers;Locomotion Level    Examination-Participation Restrictions Community Activity;Yard Work    Stability/Clinical Decision Making Stable/Uncomplicated    Rehab Potential Good    PT Frequency 2x / week    PT Duration 2 weeks    PT Treatment/Interventions ADLs/Self Care Home Management;Aquatic Therapy;Biofeedback;Canalith Repostioning;Cryotherapy;Electrical Stimulation;Iontophoresis 41m/ml Dexamethasone;Moist Heat;Traction;Ultrasound;Gait training;Functional mobility training;Therapeutic activities;Therapeutic exercise;Balance training;Neuromuscular re-education;Manual techniques;Patient/family education;Passive range of motion;Dry needling;Vestibular;Spinal Manipulations;Joint Manipulations    PT Next Visit Plan Update outcome measures/goals, review HEP, and discharge    PT Home Exercise Plan Access Code: [email or web address]    Consulted and Agree with Plan of Care Patient             Patient will benefit from skilled therapeutic intervention in order to improve the following deficits and impairments:  Abnormal gait, Pain, Decreased strength  Visit Diagnosis: Chronic right-sided low back pain with right-sided sciatica  Muscle weakness (generalized)     Problem List There are no problems to display for this patient.  JLyndel SafeHuprich PT, DPT, GCS  Amber Blevins 04/22/2021, 9:06 AM  [place] [facility] [place] [address]Phone: [phone]  Fax:  [phone] Name: Amber WiensMRN: [id number] of Birth: [birth year]

## 2021-04-23 ENCOUNTER — Other Ambulatory Visit: Payer: Self-pay

## 2021-04-23 ENCOUNTER — Ambulatory Visit: Payer: Medicare PPO

## 2021-04-23 DIAGNOSIS — M6281 Muscle weakness (generalized): Secondary | ICD-10-CM

## 2021-04-23 DIAGNOSIS — M5441 Lumbago with sciatica, right side: Secondary | ICD-10-CM

## 2021-04-23 DIAGNOSIS — G8929 Other chronic pain: Secondary | ICD-10-CM

## 2021-04-23 NOTE — Patient Instructions (Signed)
Access Code: [email or web address] URL: [email or web address] Date: 04/23/2021 Prepared by: Roxana Hires  Exercises Supine Piriformis Stretch with Leg Straight - 3 x daily - 7 x weekly - 3 reps - 45s hold Seated Piriformis Stretch (Mirrored) - 3 x daily - 7 x weekly - 3 reps - 45s hold Seated Figure 4 Piriformis Stretch (Mirrored) - 3 x daily - 7 x weekly - 3 reps - 45s hold Standing Lumbar Extension - 3 x daily - 7 x weekly - 2 sets - 10 reps - 5s hold Supine Lower Trunk Rotation - 1 x daily - 7 x weekly - 2 sets - 10 reps - 3s hold Supine Posterior Pelvic Tilt - 1 x daily - 3 x weekly - 2 sets - 10 reps - 5s hold Supine Pelvic Tilt with Straight Leg Raise - 1 x daily - 3 x weekly - 2 sets - 10 reps - 3s hold Supine Bridge - 1 x daily - 3 x weekly - 2 sets - 10 reps - 3s hold Sit to Stand Without Arm Support - 1 x daily - 3 x weekly - 2 sets - 10 reps Mini Squat with Counter Support - 1 x daily - 3 x weekly - 2 sets - 10 reps - 3s hold Standing 3-Way Leg Reach with Resistance at Ankles and Counter Support - 1 x daily - 3 x weekly - 2 sets - 10 reps

## 2021-04-23 NOTE — Therapy (Signed)
[place] [facility] [address] [place], [place], [address] Phone: [phone]   Fax:  [phone]  Physical Therapy Treatment/Discharge   Patient Details  Name: Amber Blevins MRN: [id number] Date of Birth: 1951-03-18 Referring Provider (PT): Dr. Astrid Divine   Encounter Date: 04/23/2021   PT End of Session - 04/23/21 0950     Visit Number 15    Number of Visits 21    Date for PT Re-Evaluation 05/05/21    Authorization Type eval: 02/19/21    PT Start Time 0930    PT Stop Time 1015    PT Time Calculation (min) 45 min    Activity Tolerance Patient tolerated treatment well    Behavior During Therapy [facility] for tasks assessed/performed              History reviewed. No pertinent past medical history.  History reviewed. No pertinent surgical history.  There were no vitals filed for this visit.   Subjective Assessment - 04/23/21 0935     Subjective Pt reports that she is doing alright today.  She continues with intermittent chronic right low back/right posterior hip pain.  She saw her primary care yesterday who is referring her to physiatry. She has been performing her HEP.  She would like to know if she needs to progress any of her home exercise program.    Pertinent History Pt reports intermittent low back pain for multiple years. Normally occurs with excess activity such as gardening. Pt reports some numbness and tingling in her sacral area which started in February 2022. She has not had saddle anesthesia or bowel or bladder incontinence. She is normally very active but due to COVID restrictions has been less active and thinks this has contributed. She states she was diagnosed with piriformis syndrome by her MD and was put on a 7 day prednisone taper as well as gabapentin. She states the prednisone did not help. She has gradually increased her gabapentin and does report that this has been helpful. She currently gets radicular symptoms down  her RLE. She had plain film radiographs of the lumbar spine which showed moderate to severe fecal loading throughout the length of the colon. Trace retrolisthesis of L3 versus L4. Trace retrolisthesis of L2 versus L3. No other malalignment. No fractures. Multilevel degenerative disc disease. Lower lumbar facet degenerative changes. Calcified atherosclerosis in the abdominal aorta. To help with the fecal burden she initially tried prune juice without relief so she used magnesium citrate which resolved the constipation. No improvement in the back pain once constipation resolved. She does have a history of breast cancer 12 years ago with lumpectomy and radiation. Released by oncology and now just has regular mammograms. She has a bone density test scheduled this year due to a history of estrogen blockers after her breast cancer.    How long can you sit comfortably? 1-2 hours    How long can you stand comfortably? 1-2 hours    Diagnostic tests See history    Patient Stated Goals Pt would like to be able to perform sit to stand and walk 10,000 steps without pain. Carry groceries without pain;                TREATMENT     Manual Therapy  Prone moist heat pack to posterior R hip during interim history x 5 minutes; Prone CPA and right UPA mobilizations, L2-S1 grade 2-3, 30 seconds/bout x 3 bouts/level; Updated outcome measures and goals with patient: Worst pain:  6/10 mODI: 28% FOTO: 60   % improvement: 70% improvement in symptoms since starting therapy;   Ther-ex  NuStep L2 x 5 minutes for warm-up during history  Prone straight leg hip extension 2 x 10 BLE; Sidelying straight leg hip abduction with 3# AW 2 x 10 BLE; Hooklying SLR 2 x 10 BLE: Hookling bridges 2 x 10; Hooklying lumbar rotation rocking  2 x 10 each direction; Sidelying straight leg hip abduction 2 x 10 each; HEP review;     Pt educated throughout session about proper posture and technique with exercises. Improved  exercise technique, movement at target joints, use of target muscles after min to mod verbal, visual, tactile cues.      Patient arrives with excellent motivation to participate in physical therapy. Continued manual techniques for central and right lower back and right posterior hip today.  Continued with mat table strengthening. Updated outcome measures and goals with patient today.  Her modified Oswestry Disability Index decreased to 28% and her FOTO score improved to 60. Her worst pain is 6/10 and is mostly localized in the low back and minimally in the right posterior hip.  Her hip abduction strength has improved bilaterally but her hip extension strength remains unchanged. Overall she reports approximately 60% improvement in her symptoms since starting therapy. PCP is referring to physiatry.  Reviewed and advanced HEP with patient today.  Patient will be discharged and was encouraged to continue HEP independently.                 PT Short Term Goals - 04/22/21 0900       PT SHORT TERM GOAL #1   Title Pt will be independent with HEP in order to improve strength and decrease back/hip pain in order to improve pain-free function at home.    Time 4    Period Weeks    Status Achieved               PT Long Term Goals - 04/23/21 0947       PT LONG TERM GOAL #1   Title Pt will decrease mODI score by at least 13 points in order demonstrate clinically significant reduction in back pain/disability.    Baseline 02/19/21: 36%; 04/07/21: 36%; 04/23/21: 28%;    Time 8    Period Weeks    Status Partially Met      PT LONG TERM GOAL #2   Title Pt will improve FOTO to at least 59 in order to demonstrate significant improvement in function related to her R low back/posterior hip pain    Baseline 02/19/21: 37; 04/07/21: 50; 04/23/21: 60    Time 8    Period Weeks    Status Achieved      PT LONG TERM GOAL #3   Title Pt will decrease worst back/R posterior hip pain as reported on NPRS by at  least 3 points in order to demonstrate clinically significant reduction in back pain.    Baseline 02/19/21: 8/10; 04/07/21: 8/10 (only in the back now and not hip); 04/23/21: 6/10;    Time 8    Period Weeks    Status Partially Met      PT LONG TERM GOAL #4   Title Pt will increase bilateral hip abduction and extension strength of by at least 1/2 MMT grade in order to demonstrate improvement in strength and function.    Baseline 02/19/21: 4-/5 bilaterally for both abduction and extension; 04/07/21: 4/5 bilaterally for BLE abduction and 4-/5 for  extension;    Time 8    Period Weeks    Status Partially Met                   Plan - 04/23/21 0959     Clinical Impression Statement Patient arrives with excellent motivation to participate in physical therapy. Continued manual techniques for central and right lower back and right posterior hip today.  Continued with mat table strengthening. Updated outcome measures and goals with patient today.  Her modified Oswestry Disability Index decreased to 28% and her FOTO score improved to 60. Her worst pain is 6/10 and is mostly localized in the low back and minimally in the right posterior hip.  Her hip abduction strength has improved bilaterally but her hip extension strength remains unchanged. Overall she reports approximately 60% improvement in her symptoms since starting therapy. PCP is referring to physiatry.  Reviewed and advanced HEP with patient today.  Patient will be discharged and was encouraged to continue HEP independently.    Personal Factors and Comorbidities Age;Time since onset of injury/illness/exacerbation;Comorbidity 2    Comorbidities DDD, breast CA    Examination-Activity Limitations Transfers;Locomotion Level    Examination-Participation Restrictions Community Activity;Yard Work    Stability/Clinical Decision Making Stable/Uncomplicated    Rehab Potential Good    PT Frequency 2x / week    PT Duration 2 weeks    PT  Treatment/Interventions ADLs/Self Care Home Management;Aquatic Therapy;Biofeedback;Canalith Repostioning;Cryotherapy;Electrical Stimulation;Iontophoresis 105m/ml Dexamethasone;Moist Heat;Traction;Ultrasound;Gait training;Functional mobility training;Therapeutic activities;Therapeutic exercise;Balance training;Neuromuscular re-education;Manual techniques;Patient/family education;Passive range of motion;Dry needling;Vestibular;Spinal Manipulations;Joint Manipulations    PT Next Visit Plan Discharge    PT Home Exercise Plan Access Code: [email or web address]   Consulted and Agree with Plan of Care Patient              Patient will benefit from skilled therapeutic intervention in order to improve the following deficits and impairments:  Abnormal gait, Pain, Decreased strength  Visit Diagnosis: Chronic right-sided low back pain with right-sided sciatica  Muscle weakness (generalized)     Problem List There are no problems to display for this patient.  JLyndel SafeHuprich PT, DPT, GCS  Henretta Quist 04/23/2021, 10:50 AM  [place] [facility] [place] [address]Phone: [phone]  Fax:  [phone] Name: JJaslin NovitskiMRN: [id number] of Birth: [birth year]

## 2021-05-26 ENCOUNTER — Ambulatory Visit
Admission: RE | Admit: 2021-05-26 | Discharge: 2021-05-26 | Disposition: A | Discharge status: Home / Self Care | Payer: Medicare PPO | Source: Ambulatory Visit | Attending: Family Medicine | Admitting: Family Medicine

## 2021-05-26 ENCOUNTER — Other Ambulatory Visit: Payer: Self-pay

## 2021-05-26 ENCOUNTER — Inpatient Hospital Stay: Admission: RE | Admit: 2021-05-26 | Payer: Medicare PPO | Source: Ambulatory Visit

## 2021-05-26 DIAGNOSIS — Z780 Asymptomatic menopausal state: Secondary | ICD-10-CM | POA: Diagnosis not present

## 2021-05-27 ENCOUNTER — Inpatient Hospital Stay
Admission: RE | Admit: 2021-05-27 | Discharge: 2021-05-27 | Disposition: A | Discharge status: Home / Self Care | Payer: Self-pay | Source: Ambulatory Visit | Attending: *Deleted | Admitting: *Deleted

## 2021-05-27 ENCOUNTER — Other Ambulatory Visit: Payer: Self-pay | Admitting: *Deleted

## 2021-05-27 ENCOUNTER — Other Ambulatory Visit: Payer: Self-pay | Admitting: Family Medicine

## 2021-05-27 DIAGNOSIS — Z1231 Encounter for screening mammogram for malignant neoplasm of breast: Secondary | ICD-10-CM

## 2021-06-10 DIAGNOSIS — M8588 Other specified disorders of bone density and structure, other site: Secondary | ICD-10-CM | POA: Insufficient documentation

## 2021-06-10 DIAGNOSIS — M85832 Other specified disorders of bone density and structure, left forearm: Secondary | ICD-10-CM | POA: Insufficient documentation

## 2021-06-11 ENCOUNTER — Other Ambulatory Visit: Payer: Self-pay

## 2021-06-11 ENCOUNTER — Ambulatory Visit
Admission: RE | Admit: 2021-06-11 | Discharge: 2021-06-11 | Disposition: A | Discharge status: Home / Self Care | Payer: Medicare PPO | Source: Ambulatory Visit | Attending: Family Medicine | Admitting: Family Medicine

## 2021-06-11 DIAGNOSIS — Z1231 Encounter for screening mammogram for malignant neoplasm of breast: Secondary | ICD-10-CM | POA: Diagnosis not present

## 2021-06-11 HISTORY — DX: Personal history of irradiation: Z92.3

## 2021-06-11 IMAGING — MG MM DIGITAL SCREENING BILAT W/ TOMO AND CAD
6 of 10 series · 6 of 30 positions shown · non-contrast
Comparison: Previous exam(s).

CLINICAL DATA: Screening.

EXAM:
DIGITAL SCREENING BILATERAL MAMMOGRAM WITH TOMOSYNTHESIS AND CAD
TECHNIQUE: Bilateral screening digital craniocaudal and mediolateral oblique
mammograms were obtained. Bilateral screening digital breast
tomosynthesis was performed. The images were evaluated with
computer-aided detection.

[R MLO synth-2D (1 of 2)]
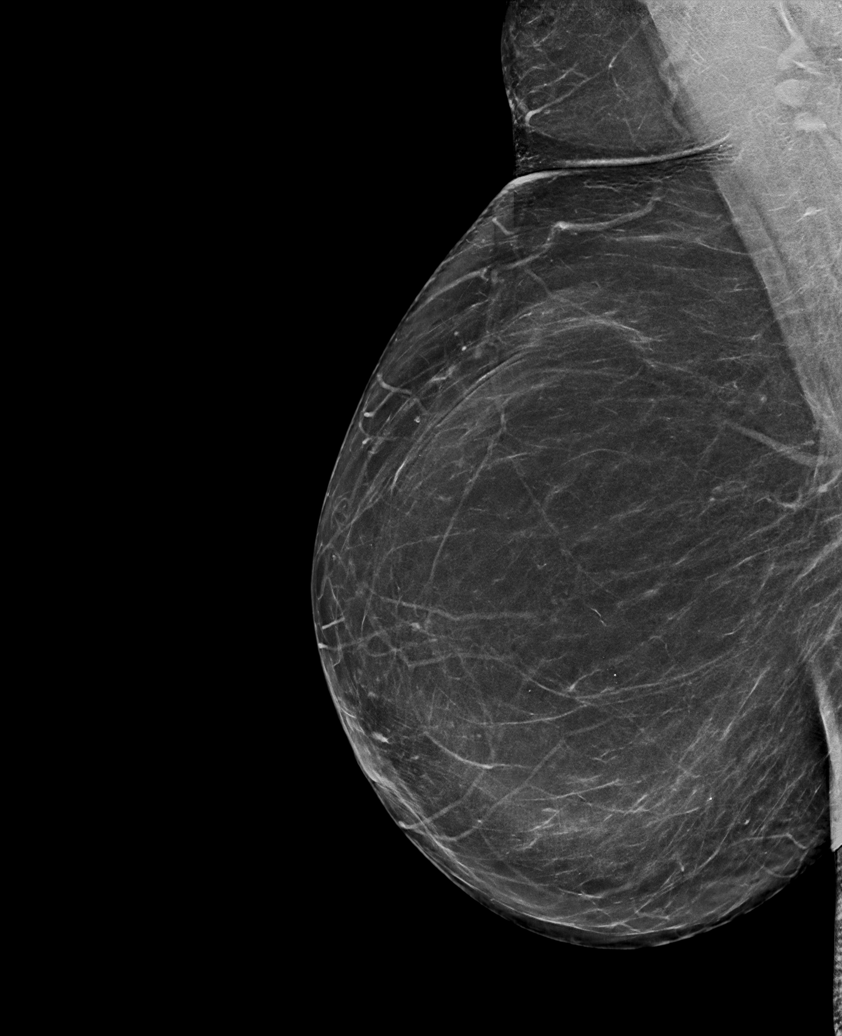

[L CC synth-2D]
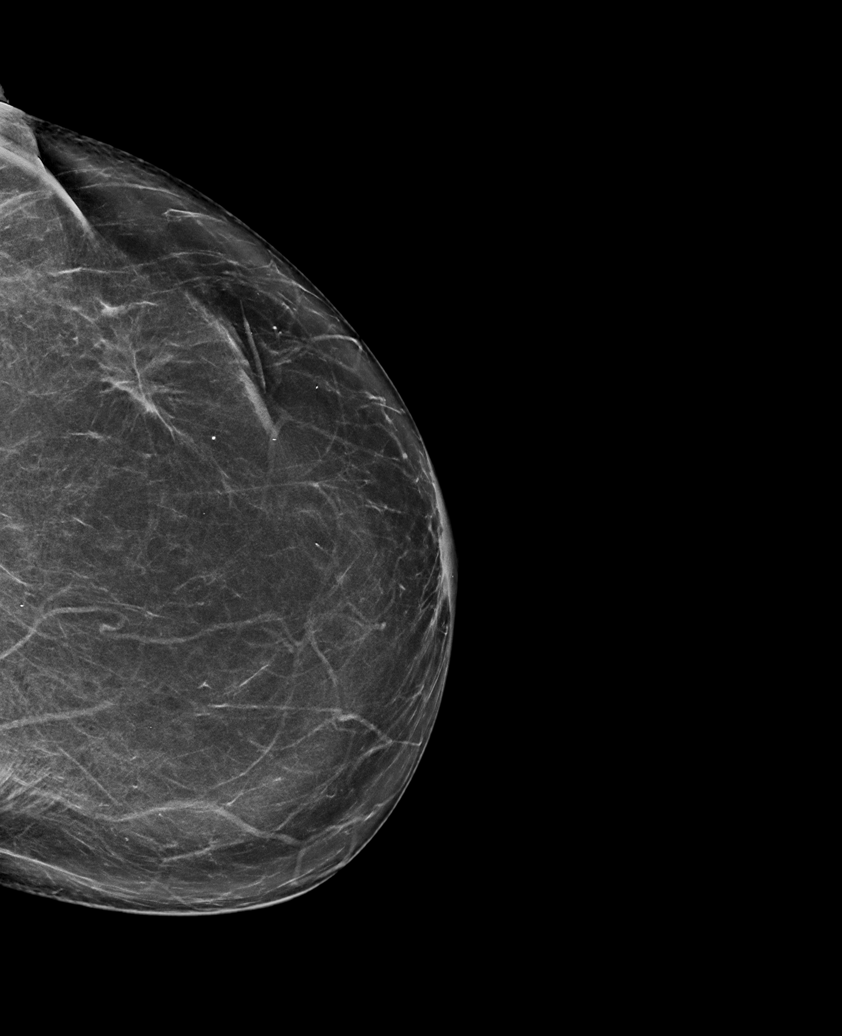

[L MLO synth-2D]
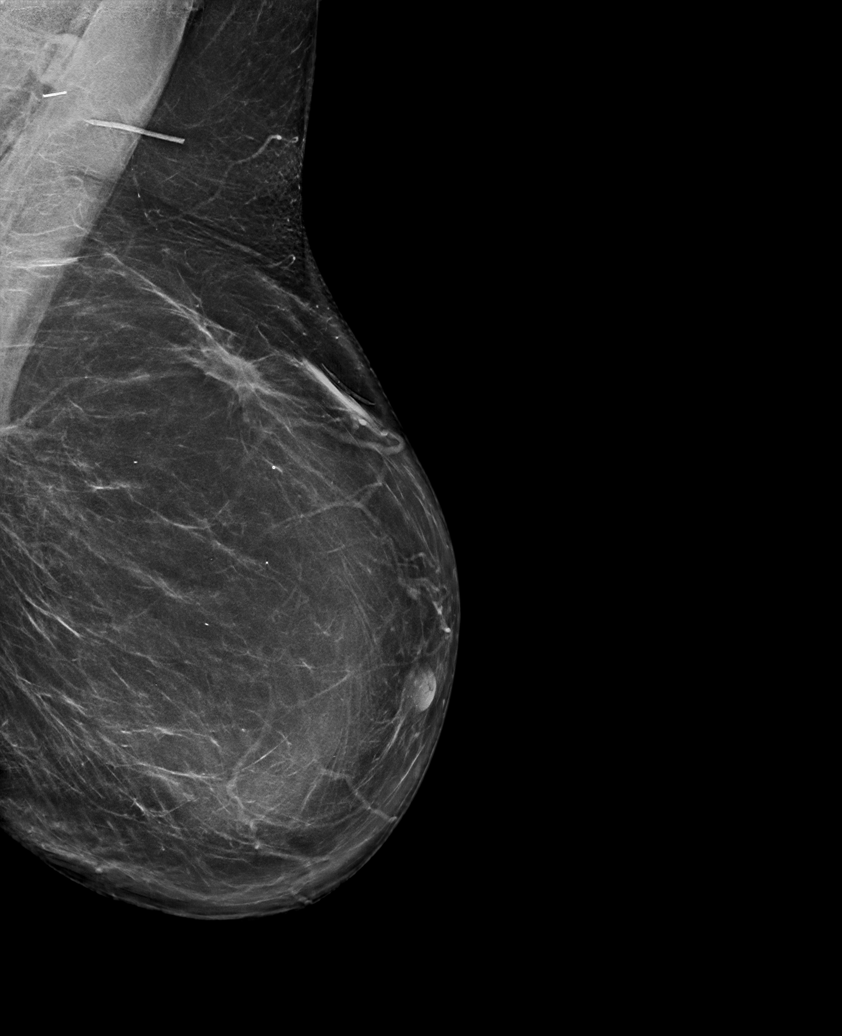

[R CC synth-2D]
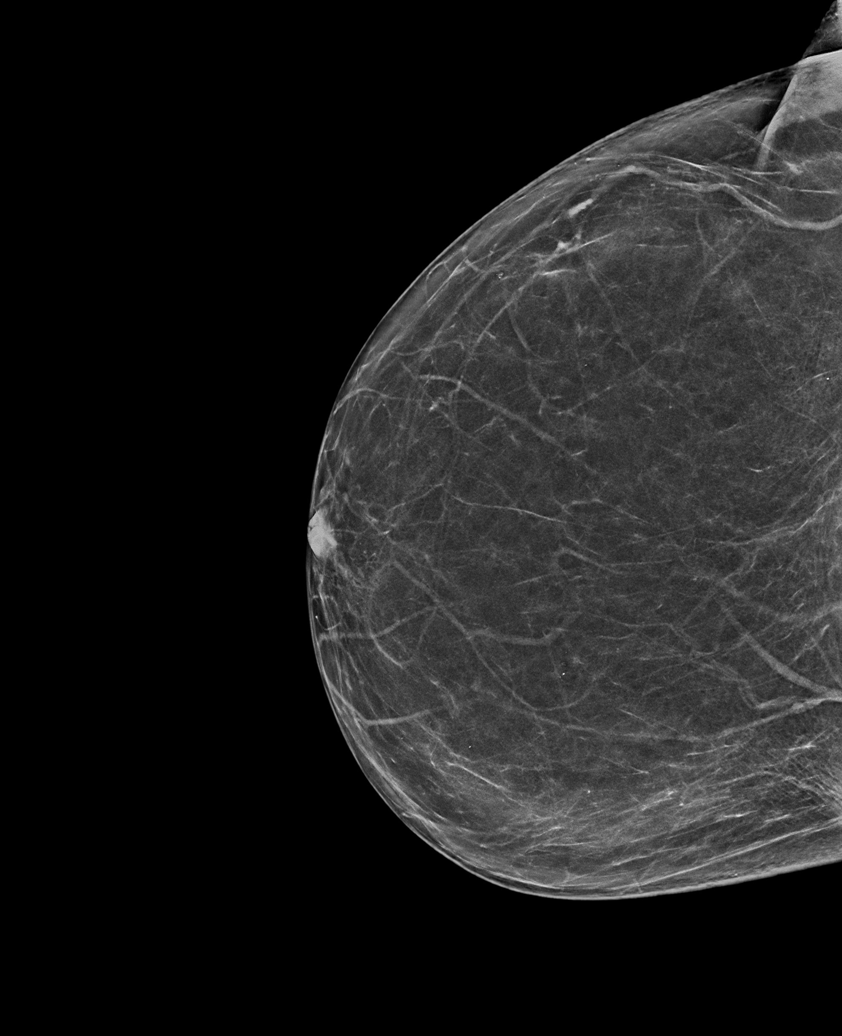

[R MLO synth-2D (2 of 2)]
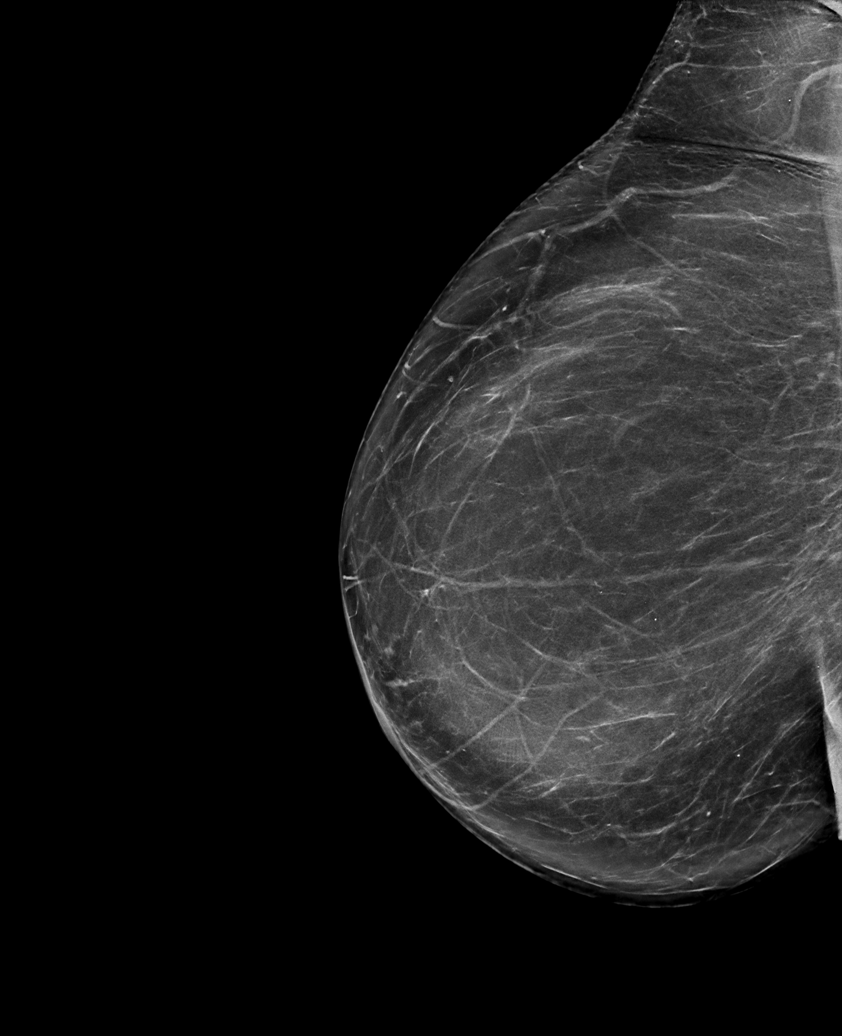

[R MLO tomo · tomo slice 40/79.0]
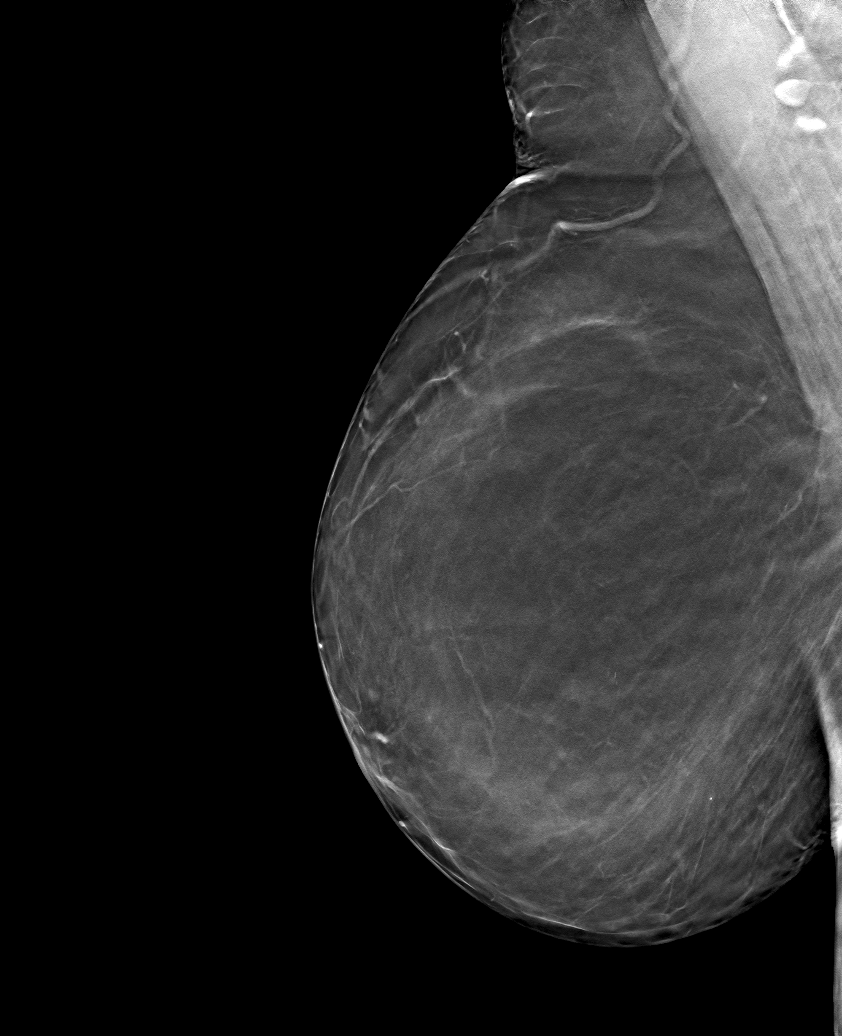

[6 of 30 positions shown; findings below may reference images not displayed]

ACR Breast Density Category b: There are scattered areas of
fibroglandular density.
FINDINGS: There are no findings suspicious for malignancy.
IMPRESSION: No mammographic evidence of malignancy. A result letter of this
screening mammogram will be mailed directly to the patient.

RECOMMENDATION:
Screening mammogram in one year. (Code:[BY])

BI-RADS CATEGORY  1: Negative.

## 2021-06-13 ENCOUNTER — Other Ambulatory Visit: Payer: Self-pay | Admitting: Family Medicine

## 2021-06-13 DIAGNOSIS — G8929 Other chronic pain: Secondary | ICD-10-CM

## 2021-06-13 DIAGNOSIS — Z853 Personal history of malignant neoplasm of breast: Secondary | ICD-10-CM

## 2021-06-24 ENCOUNTER — Other Ambulatory Visit: Payer: Self-pay | Admitting: Specialist

## 2021-06-24 DIAGNOSIS — J849 Interstitial pulmonary disease, unspecified: Secondary | ICD-10-CM

## 2021-06-25 ENCOUNTER — Ambulatory Visit
Admission: RE | Admit: 2021-06-25 | Discharge: 2021-06-25 | Disposition: A | Discharge status: Home / Self Care | Payer: Medicare PPO | Source: Ambulatory Visit | Attending: Family Medicine | Admitting: Family Medicine

## 2021-06-25 ENCOUNTER — Other Ambulatory Visit: Payer: Self-pay

## 2021-06-25 DIAGNOSIS — M5441 Lumbago with sciatica, right side: Secondary | ICD-10-CM | POA: Insufficient documentation

## 2021-06-25 DIAGNOSIS — G8929 Other chronic pain: Secondary | ICD-10-CM | POA: Diagnosis present

## 2021-06-25 DIAGNOSIS — M5442 Lumbago with sciatica, left side: Secondary | ICD-10-CM | POA: Diagnosis present

## 2021-06-25 DIAGNOSIS — Z853 Personal history of malignant neoplasm of breast: Secondary | ICD-10-CM

## 2021-06-25 IMAGING — MR MR LUMBAR SPINE WO/W CM
4 of 7 series · 27 of 48 positions shown · IV contrast (gadavist)
Comparison: Lumbar radiographs [DATE].
COMPARISON: Lumbar radiographs [DATE].

Addendum:
CLINICAL DATA: 70-year-old female with symptoms beginning in
[REDACTED], numbness in the central low back. Progressive right
greater than left low back pain and right leg pain. No known injury.

EXAM:
MRI LUMBAR SPINE WITHOUT AND WITH CONTRAST
TECHNIQUE: Multiplanar and multiecho pulse sequences of the lumbar spine were
obtained without and with intravenous contrast.
CONTRAST:  8mL GADAVIST GADOBUTROL 1 MMOL/ML IV SOLN

[Series 5: T2 · sagittal · 4.0mm · 0.81mm/px · 6 of 17 slices shown (1 of 2)]
[im 1/17]
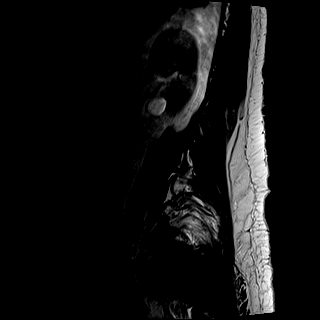
[im 4/17]
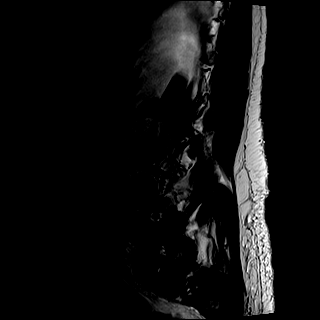
[im 7/17]
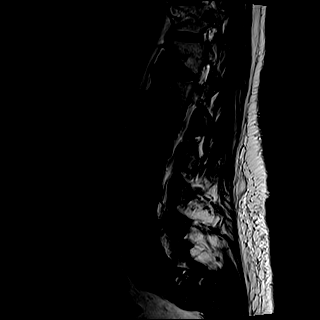
[im 10/17]
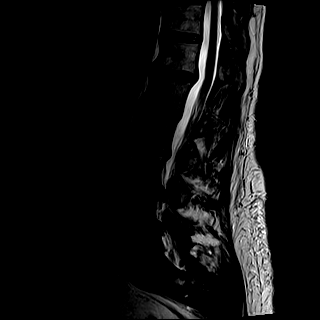
[im 13/17]
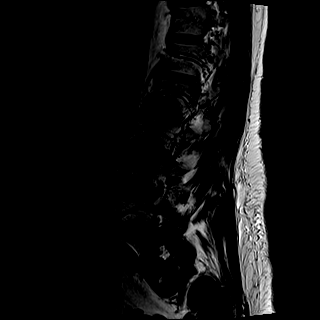
[im 17/17]
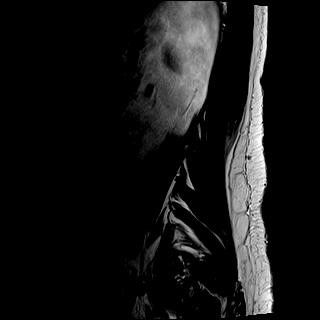

[Series 6: T1 · sagittal · 4.0mm · 0.81mm/px · 5 of 17 slices shown (1 of 2)]
[im 1/17]
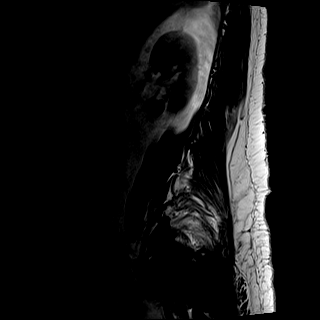
[im 5/17]
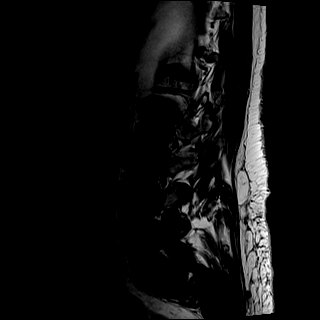
[im 9/17]
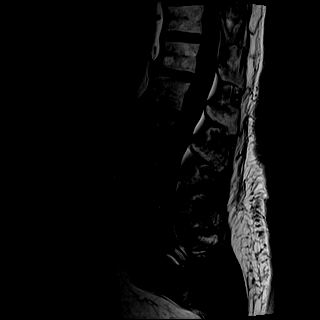
[im 13/17]
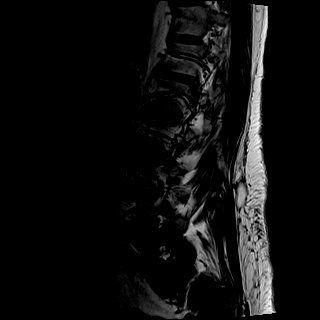
[im 17/17]
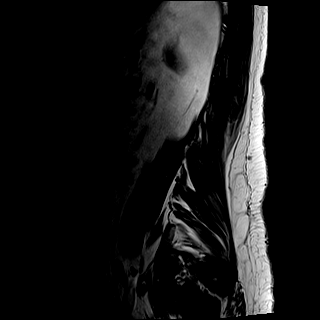

[Series 8: T2 · axial · 4.0mm · 0.78mm/px · z∈[+5,+198]mm · 9 of 33 slices shown (2 of 2)]
[im 1/33]
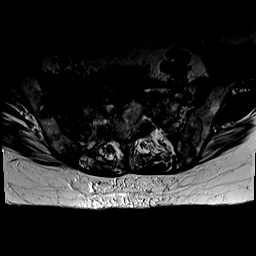
[im 5/33]
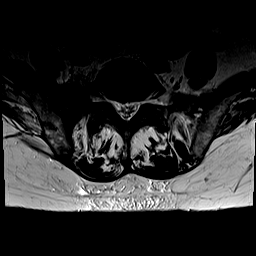
[im 9/33]
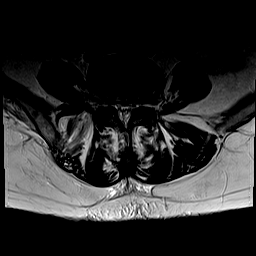
[im 13/33]
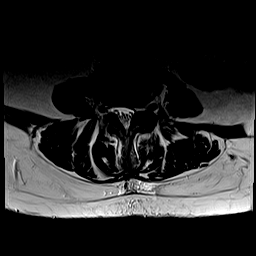
[im 17/33]
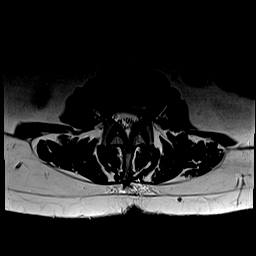
[im 21/33]
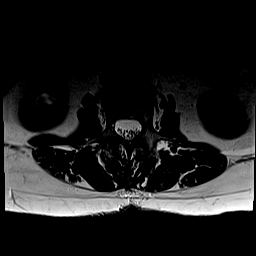
[im 25/33]
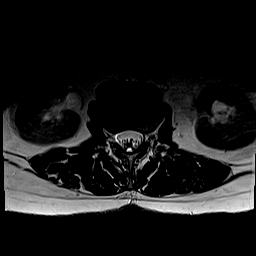
[im 29/33]
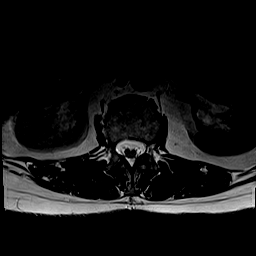
[im 33/33]
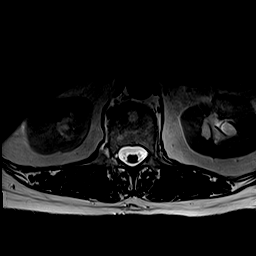

[Series 9: T1 · axial · 4.0mm · 0.39mm/px · z∈[+5,+178]mm · 7 of 33 slices shown (2 of 2)]
[im 1/33]
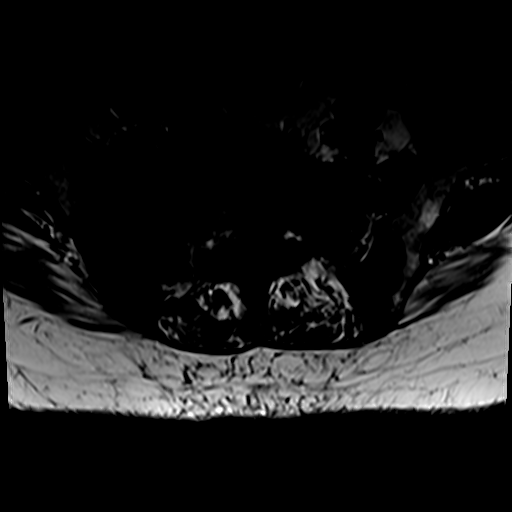
[im 5/33]
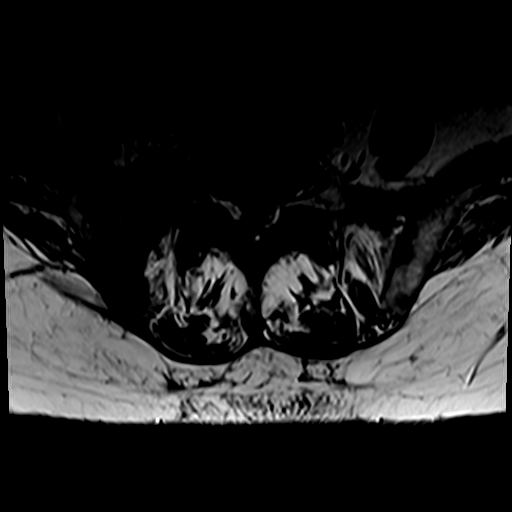
[im 9/33]
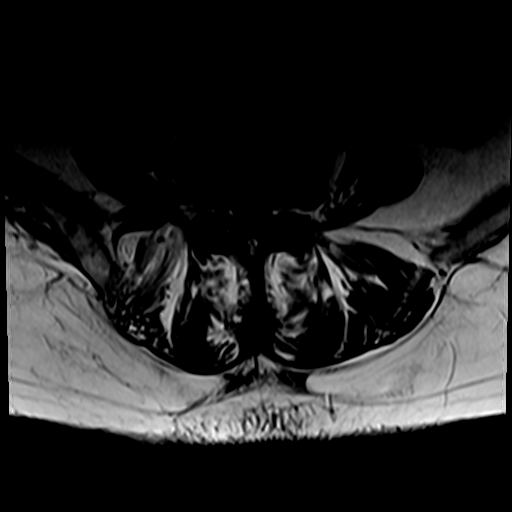
[im 13/33]
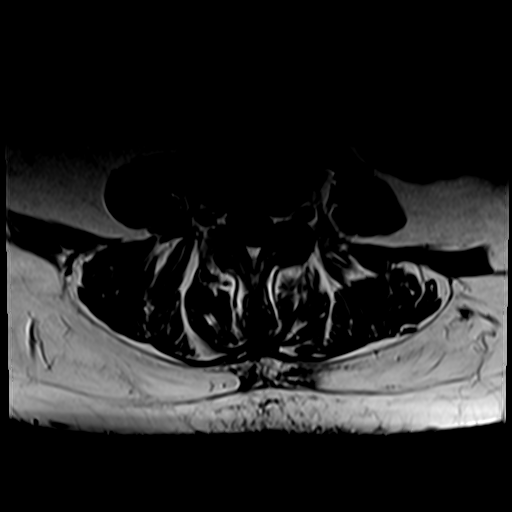
[im 17/33]
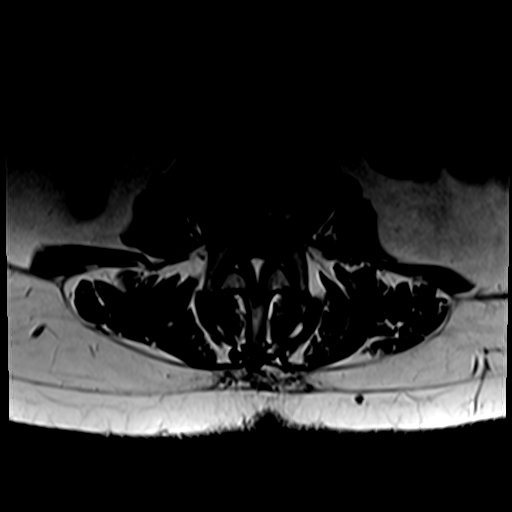
[im 21/33]
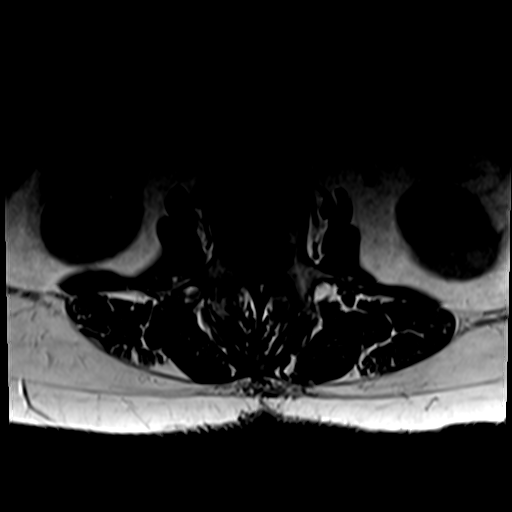
[im 29/33]
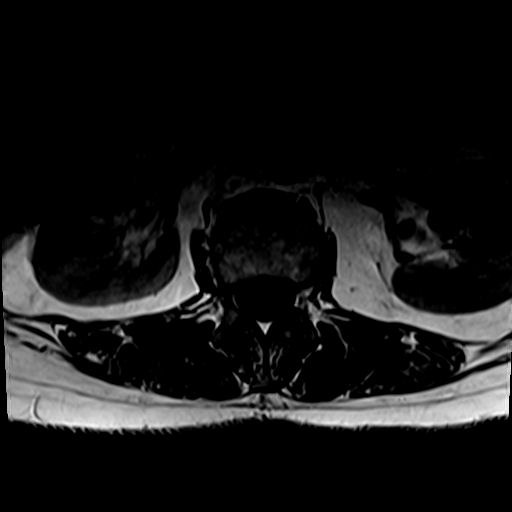

[27 of 48 positions shown; findings below may reference images not displayed]

FINDINGS: Segmentation:  Normal on [REDACTED] radiographs.

Alignment: Lumbar lordosis not significantly changed. Occasional
mild degenerative spondylolisthesis.

Vertebrae: Subtotal tumor replacement of the visible sacrum and
pelvis (series 9, image 33) with enhancing tumor filling the sacral
spinal canal and bilateral S1 through visible S3 neural foramina.
Early circumferential extraosseous extension of tumor from the
involved sacral and pelvic bones.

Total tumor replacement also of the L5 vertebra, with bulky epidural
extension, early extension into the bilateral L5 neural foramina.

Subtotal tumor replacement of the L4 vertebra. Ventral epidural
tumor and early extension into the right L4 neural foramen.

Multifocal tumor metastases in the L3 vertebral body, most notably
posteriorly and on the right. Separate bulky tumor of the L3 spinous
process with early extraosseous extension. No significant epidural
tumor here.

Similar patchy metastatic tumor in the central and right L2 body. No
epidural tumor.

Small round 8 mm tumor metastasis in the left L1 body.

Larger and more confluent roughly 18 mm tumor in the right T12
inferolateral endplate. Smaller separate round 5 mm tumor metastasis
in the anterior T12 body.

The visible T11 level is spared.

Conus medullaris and cauda equina: Conus extends to the L1-L2 level.
No lower spinal cord or conus signal abnormality. No abnormal
intradural enhancement is identified. But there is bulky epidural
and/or dural tumor beginning at the L4 level and obliterating the
spinal canal by S1.

Paraspinal and other soft tissues: Early paraspinal extension of
tumor from the involved segments as detailed above.

In the visible abdomen and pelvis no primary tumor is identified.
There is extensive diverticulosis of the distal colon.

Disc levels:

Superimposed lumbar degenerative changes L2-L3 through L4-L5,
including up to moderate degenerative L2 and L4 neural foraminal
stenosis, and up to severe degenerative left L3-L4 neural foramen
and lateral recess stenosis related to left foraminal and caudal
disc herniation seen on series 5, image 10 and series 8, image 23
(severe left L3 and L4 nerve involvement).
IMPRESSION: 1. Extensive malignant tumor replacing the bones of the lower lumbar
vertebrae (L4 and L5), visible sacrum, and pelvis.
Extraosseous extension of tumor resulting in severe malignant spinal
stenosis beginning at L4, and obliterating the visible sacral spinal
canal and bilateral neural foramina. Additional metastatic
involvement T12, L1 through L3.
No primary tumor site identified. Top differential considerations
are Metastatic Disease Unknown primary, less likely Lymphoma or
Multiple Myeloma.

2. Superimposed lumbar spine degeneration, including degenerative
moderate to severe left L3 and L4 nerve level impingement from disc
herniation.

ADDENDUM:
Study discussed by telephone with Dr. TIGER (covering for Dr.
TIGER) on [DATE] at [DATE] .

*** End of Addendum ***
FINDINGS: Segmentation:  Normal on [REDACTED] radiographs.

Alignment: Lumbar lordosis not significantly changed. Occasional
mild degenerative spondylolisthesis.

Vertebrae: Subtotal tumor replacement of the visible sacrum and
pelvis (series 9, image 33) with enhancing tumor filling the sacral
spinal canal and bilateral S1 through visible S3 neural foramina.
Early circumferential extraosseous extension of tumor from the
involved sacral and pelvic bones.

Total tumor replacement also of the L5 vertebra, with bulky epidural
extension, early extension into the bilateral L5 neural foramina.

Subtotal tumor replacement of the L4 vertebra. Ventral epidural
tumor and early extension into the right L4 neural foramen.

Multifocal tumor metastases in the L3 vertebral body, most notably
posteriorly and on the right. Separate bulky tumor of the L3 spinous
process with early extraosseous extension. No significant epidural
tumor here.

Similar patchy metastatic tumor in the central and right L2 body. No
epidural tumor.

Small round 8 mm tumor metastasis in the left L1 body.

Larger and more confluent roughly 18 mm tumor in the right T12
inferolateral endplate. Smaller separate round 5 mm tumor metastasis
in the anterior T12 body.

The visible T11 level is spared.

Conus medullaris and cauda equina: Conus extends to the L1-L2 level.
No lower spinal cord or conus signal abnormality. No abnormal
intradural enhancement is identified. But there is bulky epidural
and/or dural tumor beginning at the L4 level and obliterating the
spinal canal by S1.

Paraspinal and other soft tissues: Early paraspinal extension of
tumor from the involved segments as detailed above.

In the visible abdomen and pelvis no primary tumor is identified.
There is extensive diverticulosis of the distal colon.

Disc levels:

Superimposed lumbar degenerative changes L2-L3 through L4-L5,
including up to moderate degenerative L2 and L4 neural foraminal
stenosis, and up to severe degenerative left L3-L4 neural foramen
and lateral recess stenosis related to left foraminal and caudal
disc herniation seen on series 5, image 10 and series 8, image 23
(severe left L3 and L4 nerve involvement).
IMPRESSION: 1. Extensive malignant tumor replacing the bones of the lower lumbar
vertebrae (L4 and L5), visible sacrum, and pelvis.
Extraosseous extension of tumor resulting in severe malignant spinal
stenosis beginning at L4, and obliterating the visible sacral spinal
canal and bilateral neural foramina. Additional metastatic
involvement T12, L1 through L3.
No primary tumor site identified. Top differential considerations
are Metastatic Disease Unknown primary, less likely Lymphoma or
Multiple Myeloma.

2. Superimposed lumbar spine degeneration, including degenerative
moderate to severe left L3 and L4 nerve level impingement from disc
herniation.

## 2021-06-25 MED ORDER — GADOBUTROL 1 MMOL/ML IV SOLN
8.0000 mL | Freq: Once | INTRAVENOUS | Status: AC | PRN
  Administered 2021-06-25: 8 mL via INTRAVENOUS

## 2021-06-27 ENCOUNTER — Other Ambulatory Visit (HOSPITAL_COMMUNITY): Payer: Self-pay | Admitting: Family Medicine

## 2021-06-27 ENCOUNTER — Other Ambulatory Visit: Payer: Self-pay | Admitting: Family Medicine

## 2021-06-27 DIAGNOSIS — G8929 Other chronic pain: Secondary | ICD-10-CM

## 2021-06-27 DIAGNOSIS — Z853 Personal history of malignant neoplasm of breast: Secondary | ICD-10-CM

## 2021-07-02 ENCOUNTER — Other Ambulatory Visit: Payer: Self-pay

## 2021-07-02 ENCOUNTER — Telehealth: Payer: Self-pay

## 2021-07-02 ENCOUNTER — Encounter: Payer: Self-pay | Admitting: Oncology

## 2021-07-02 ENCOUNTER — Inpatient Hospital Stay: Payer: Medicare PPO | Admitting: Oncology

## 2021-07-02 ENCOUNTER — Ambulatory Visit
Admission: RE | Admit: 2021-07-02 | Discharge: 2021-07-02 | Disposition: A | Discharge status: Home / Self Care | Payer: Medicare PPO | Source: Ambulatory Visit | Attending: Oncology | Admitting: Oncology

## 2021-07-02 ENCOUNTER — Inpatient Hospital Stay: Payer: Medicare PPO

## 2021-07-02 VITALS — BP 122/69 | HR 87 | Temp 98.8°F | Resp 18 | Ht 65.0 in | Wt 179.0 lb

## 2021-07-02 DIAGNOSIS — C7970 Secondary malignant neoplasm of unspecified adrenal gland: Secondary | ICD-10-CM | POA: Insufficient documentation

## 2021-07-02 DIAGNOSIS — M899 Disorder of bone, unspecified: Secondary | ICD-10-CM | POA: Insufficient documentation

## 2021-07-02 DIAGNOSIS — R918 Other nonspecific abnormal finding of lung field: Secondary | ICD-10-CM | POA: Insufficient documentation

## 2021-07-02 DIAGNOSIS — C7800 Secondary malignant neoplasm of unspecified lung: Secondary | ICD-10-CM | POA: Insufficient documentation

## 2021-07-02 DIAGNOSIS — C7971 Secondary malignant neoplasm of right adrenal gland: Secondary | ICD-10-CM | POA: Insufficient documentation

## 2021-07-02 DIAGNOSIS — Z87891 Personal history of nicotine dependence: Secondary | ICD-10-CM | POA: Insufficient documentation

## 2021-07-02 DIAGNOSIS — C7951 Secondary malignant neoplasm of bone: Secondary | ICD-10-CM | POA: Insufficient documentation

## 2021-07-02 DIAGNOSIS — Z853 Personal history of malignant neoplasm of breast: Secondary | ICD-10-CM | POA: Insufficient documentation

## 2021-07-02 DIAGNOSIS — C773 Secondary and unspecified malignant neoplasm of axilla and upper limb lymph nodes: Secondary | ICD-10-CM | POA: Insufficient documentation

## 2021-07-02 DIAGNOSIS — M5126 Other intervertebral disc displacement, lumbar region: Secondary | ICD-10-CM | POA: Insufficient documentation

## 2021-07-02 DIAGNOSIS — G893 Neoplasm related pain (acute) (chronic): Secondary | ICD-10-CM | POA: Diagnosis not present

## 2021-07-02 DIAGNOSIS — E7800 Pure hypercholesterolemia, unspecified: Secondary | ICD-10-CM | POA: Insufficient documentation

## 2021-07-02 DIAGNOSIS — Z510 Encounter for antineoplastic radiation therapy: Secondary | ICD-10-CM | POA: Insufficient documentation

## 2021-07-02 DIAGNOSIS — C7972 Secondary malignant neoplasm of left adrenal gland: Secondary | ICD-10-CM | POA: Insufficient documentation

## 2021-07-02 DIAGNOSIS — I700 Atherosclerosis of aorta: Secondary | ICD-10-CM | POA: Insufficient documentation

## 2021-07-02 DIAGNOSIS — I10 Essential (primary) hypertension: Secondary | ICD-10-CM | POA: Insufficient documentation

## 2021-07-02 DIAGNOSIS — Z923 Personal history of irradiation: Secondary | ICD-10-CM | POA: Insufficient documentation

## 2021-07-02 DIAGNOSIS — J9601 Acute respiratory failure with hypoxia: Secondary | ICD-10-CM

## 2021-07-02 DIAGNOSIS — R590 Localized enlarged lymph nodes: Secondary | ICD-10-CM | POA: Insufficient documentation

## 2021-07-02 DIAGNOSIS — Z79899 Other long term (current) drug therapy: Secondary | ICD-10-CM | POA: Insufficient documentation

## 2021-07-02 DIAGNOSIS — C787 Secondary malignant neoplasm of liver and intrahepatic bile duct: Secondary | ICD-10-CM | POA: Insufficient documentation

## 2021-07-02 DIAGNOSIS — C50919 Malignant neoplasm of unspecified site of unspecified female breast: Secondary | ICD-10-CM | POA: Insufficient documentation

## 2021-07-02 LAB — CBC WITH DIFFERENTIAL/PLATELET
Abs Immature Granulocytes: 0.04 10*3/uL (ref 0.00–0.07)
Basophils Absolute: 0 10*3/uL (ref 0.0–0.1)
Basophils Relative: 0 %
Eosinophils Absolute: 0.1 10*3/uL (ref 0.0–0.5)
Eosinophils Relative: 1 %
HCT: 41.2 % (ref 36.0–46.0)
Hemoglobin: 13.3 g/dL (ref 12.0–15.0)
Immature Granulocytes: 0 %
Lymphocytes Relative: 15 %
Lymphs Abs: 1.9 10*3/uL (ref 0.7–4.0)
MCH: 28.3 pg (ref 26.0–34.0)
MCHC: 32.3 g/dL (ref 30.0–36.0)
MCV: 87.7 fL (ref 80.0–100.0)
Monocytes Absolute: 0.9 10*3/uL (ref 0.1–1.0)
Monocytes Relative: 7 %
Neutro Abs: 9.3 10*3/uL — ABNORMAL HIGH (ref 1.7–7.7)
Neutrophils Relative %: 77 %
Platelets: 384 10*3/uL (ref 150–400)
RBC: 4.7 MIL/uL (ref 3.87–5.11)
RDW: 14.1 % (ref 11.5–15.5)
WBC: 12.3 10*3/uL — ABNORMAL HIGH (ref 4.0–10.5)
nRBC: 0 % (ref 0.0–0.2)

## 2021-07-02 LAB — COMPREHENSIVE METABOLIC PANEL
ALT: 18 U/L (ref 0–44)
AST: 28 U/L (ref 15–41)
Albumin: 4.3 g/dL (ref 3.5–5.0)
Alkaline Phosphatase: 107 U/L (ref 38–126)
Anion gap: 11 (ref 5–15)
BUN: 18 mg/dL (ref 8–23)
CO2: 26 mmol/L (ref 22–32)
Calcium: 9.4 mg/dL (ref 8.9–10.3)
Chloride: 95 mmol/L — ABNORMAL LOW (ref 98–111)
Creatinine, Ser: 0.75 mg/dL (ref 0.44–1.00)
GFR, Estimated: >60 mL/min (ref >60)
Glucose, Bld: 100 mg/dL — ABNORMAL HIGH (ref 70–99)
Potassium: 3.6 mmol/L (ref 3.5–5.1)
Sodium: 132 mmol/L — ABNORMAL LOW (ref 135–145)
Total Bilirubin: 1.2 mg/dL (ref 0.3–1.2)
Total Protein: 8.3 g/dL — ABNORMAL HIGH (ref 6.5–8.1)

## 2021-07-02 LAB — LACTATE DEHYDROGENASE: LDH: 177 U/L (ref 98–192)

## 2021-07-02 IMAGING — CT CT CHEST-ABD-PELV W/ CM
3 of 5 series · 14 of 36 positions shown, 16 images · IV contrast (omnipaque)
Comparison: Lumbar spine MRI from [DATE]

CLINICAL DATA: Remote history of breast cancer ([IU]). New
lumbosacral bone lesions found on MRI lumbar spine [DATE]

EXAM:
CT CHEST, ABDOMEN, AND PELVIS WITH CONTRAST
TECHNIQUE: Multidetector CT imaging of the chest, abdomen and pelvis was
performed following the standard protocol during bolus
administration of intravenous contrast.
CONTRAST:  80mL OMNIPAQUE IOHEXOL 350 MG/ML SOLN

[Series 2: cap with · axial · 0.80mm/px · z∈[-371,+109]mm · 9 of 121 slices shown, 11 images]
[im 13/121  mediastinal]
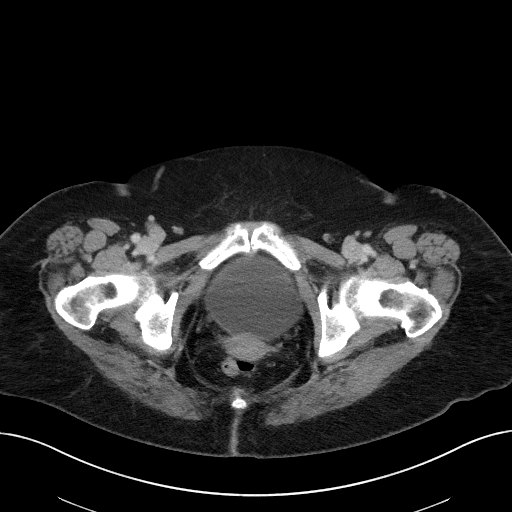
[im 13/121  bone]
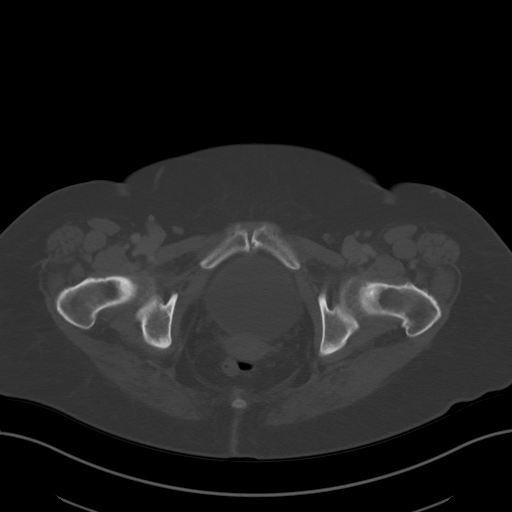
[im 25/121  mediastinal]
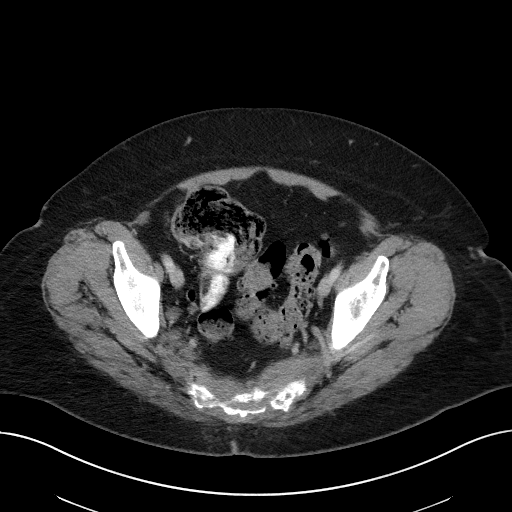
[im 37/121  mediastinal]
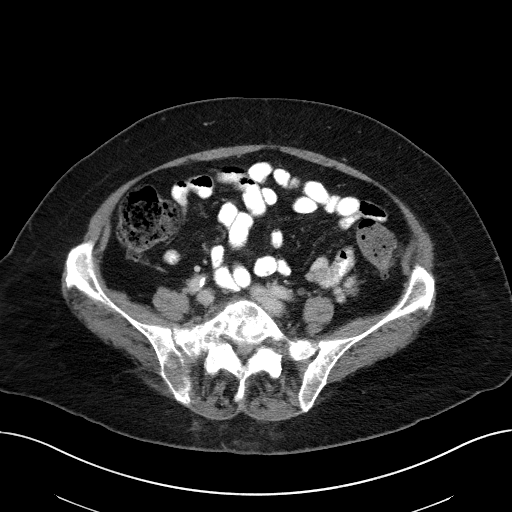
[im 49/121  mediastinal]
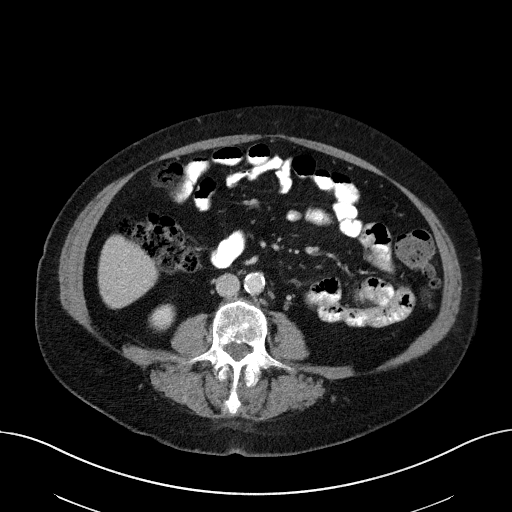
[im 61/121  mediastinal]
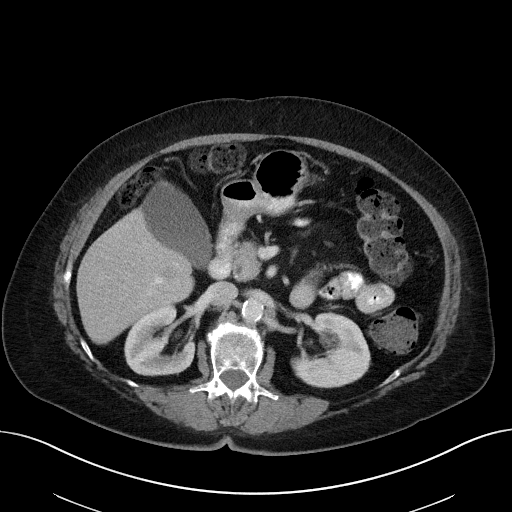
[im 73/121  mediastinal]
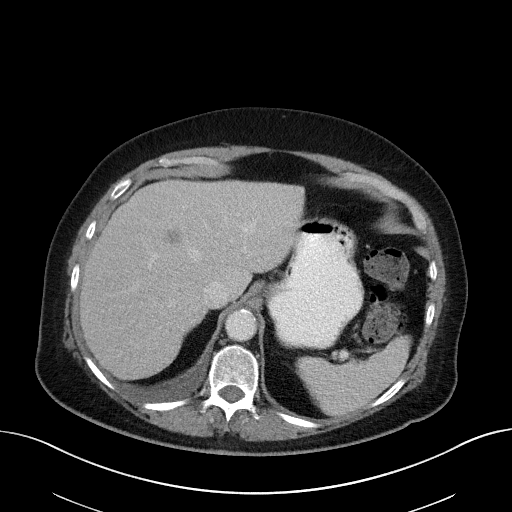
[im 85/121  mediastinal]
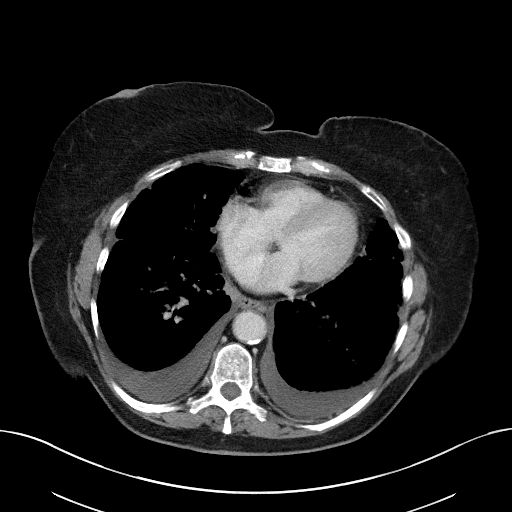
[im 97/121  mediastinal]
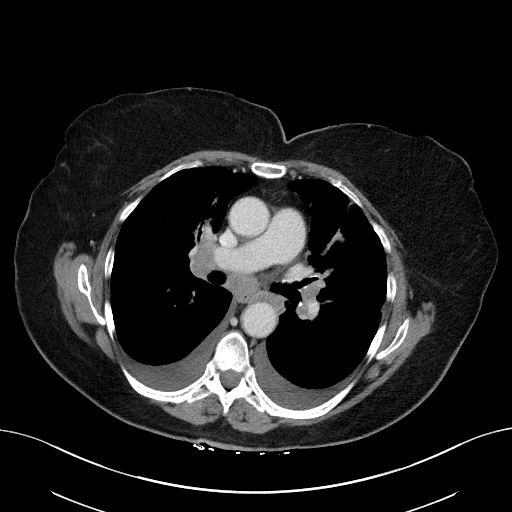
[im 109/121  mediastinal]
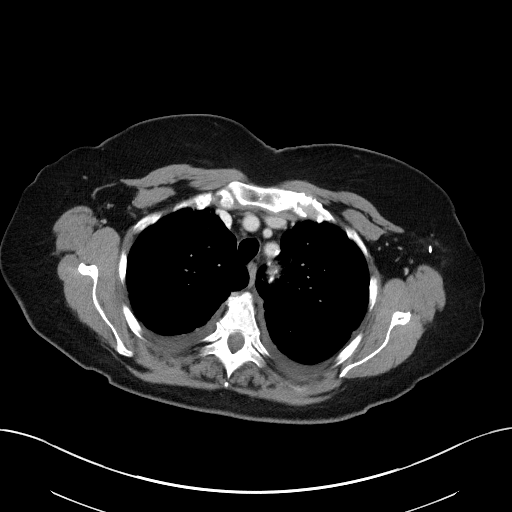
[im 109/121  bone]
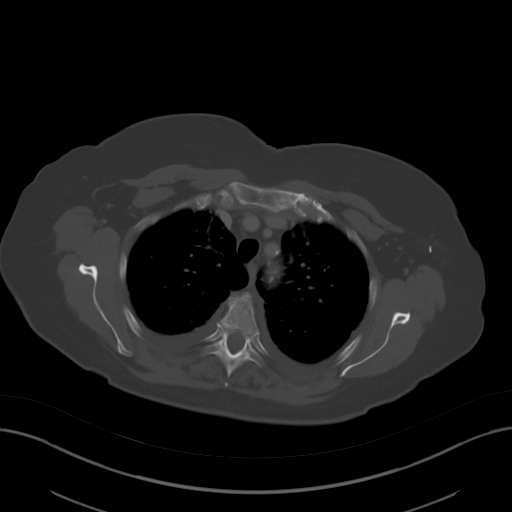

[Series 4: lung · axial · 0.73mm/px · z∈[-111,-65]mm · 2 of 152 slices shown]
[im 12/152  bone]
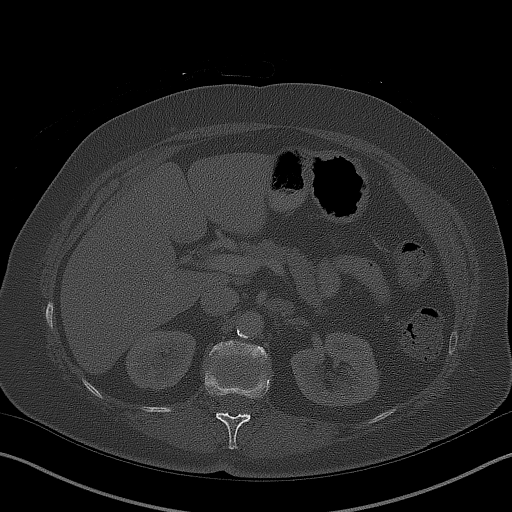
[im 35/152  bone]
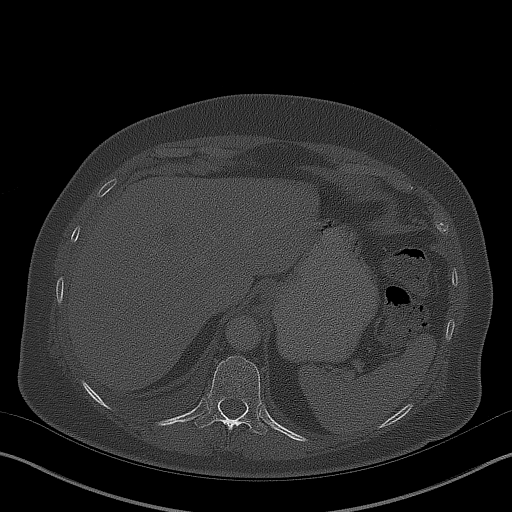

[Series 5: coronals · coronal · 0.79mm/px · 3 of 142 slices shown]
[im 29/142  mediastinal]
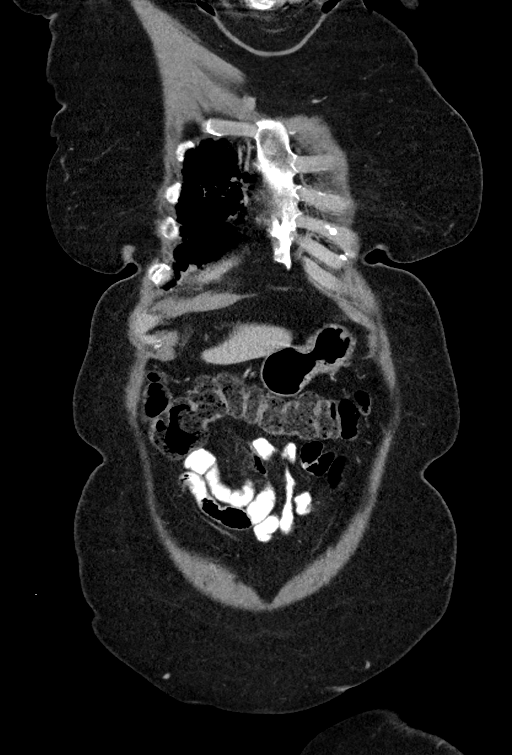
[im 57/142  mediastinal]
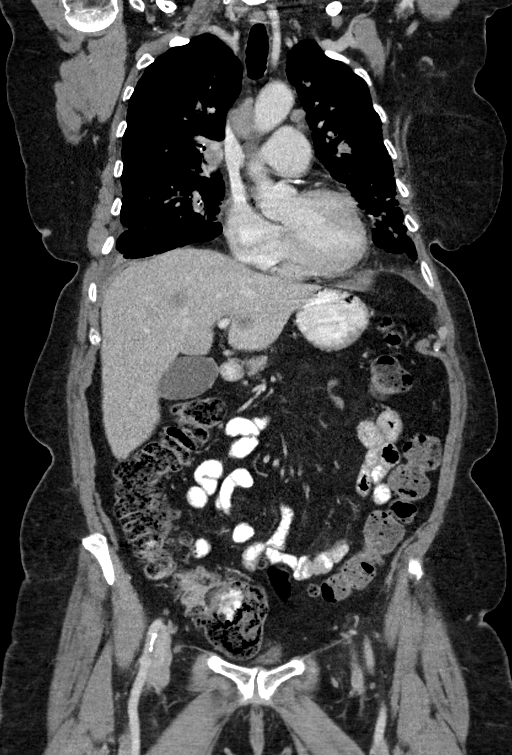
[im 85/142  mediastinal]
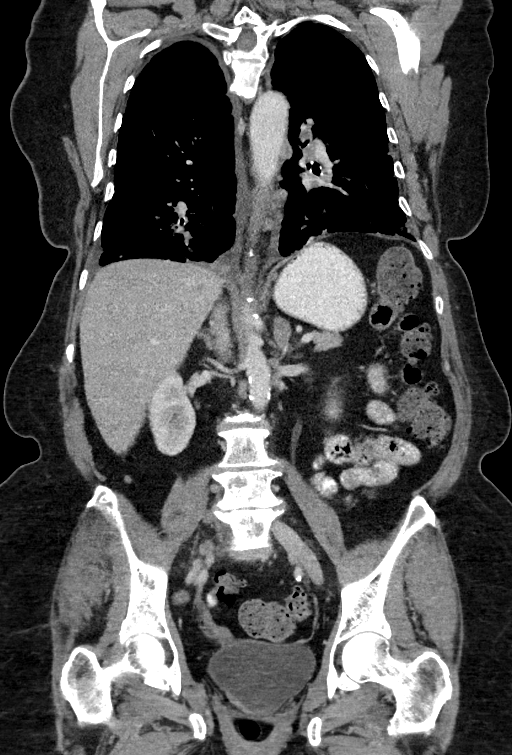

[14 of 36 positions shown; findings below may reference images not displayed]

FINDINGS: CT CHEST FINDINGS

Cardiovascular: The heart is normal in size. No pericardial
effusion. The aorta is normal in caliber. No dissection. Moderate
atherosclerotic calcifications. Scattered coronary artery
calcifications.

Mediastinum/Nodes: Mediastinal and hilar lymphadenopathy consistent
with metastatic disease.

Pretracheal lymph node on image [DATE] measures 20 mm

Prevascular node on image [DATE] measures 12 mm.

Right hilar node on image [DATE] measures 19 mm.

Subcarinal node on image [DATE] measures 14 mm

The esophagus is grossly normal.

Lungs/Pleura: Innumerable small pulmonary and pleural nodules
involving both lungs consistent with diffuse metastatic disease.
There are associated bilateral pleural effusions likely malignant.

Musculoskeletal: No breast masses, supraclavicular or axillary
adenopathy. The thyroid gland is unremarkable.

Mixed lytic and sclerotic metastatic bone disease most notably
affecting T3 and T7. Mild pathologic compression fracture of T7
canal compromise. There is also a small lytic lesion involving T12.
Suspect a mildly sclerotic lesion involving the manubrium of the
sternum. Subtle mixed lytic and sclerotic lesion involving the upper
sternum with some erosion of the posterior cortex.

CT ABDOMEN PELVIS FINDINGS

Hepatobiliary: There are 5 vague low-attenuation hepatic lesions
consistent with metastatic disease.

17 mm lesion in segment 8, 11.5 mm lesion in segment 3, 10 mm lesion
in segment 4B, 15 mm lesion lower in segment 4B and 15 mm lesion in
segment 6.

The gallbladder is unremarkable.  No common bile duct dilatation.

Pancreas: No mass, inflammation or ductal dilatation.

Spleen: Normal size.  No focal lesions.

Adrenals/Urinary Tract: Bilateral adrenal gland lesions likely
metastatic disease. The largest lesion measures 13 mm in the left
gland.

The kidneys are unremarkable. No renal lesions or hydronephrosis.
The bladder is unremarkable.

Stomach/Bowel: The stomach, duodenum, small bowel and colon are
grossly normal.

Vascular/Lymphatic: Moderate atherosclerotic calcifications
involving the aorta and branch vessels but no aneurysm or
dissection.

No mesenteric or retroperitoneal mass or adenopathy.

Reproductive: The uterus and ovaries are unremarkable.

Other: No pelvic mass or adenopathy. No free pelvic fluid
collections. No inguinal mass or adenopathy. No abdominal wall
hernia or subcutaneous lesions.

Musculoskeletal: Severe destructive metastatic bone disease
involving the lower lumbar vertebral bodies, the sacrum and iliac
bones. The sacrum is largely destroyed. No definite involvement the
hips.
IMPRESSION: 1. Innumerable small pulmonary and pleural nodules consistent with
diffuse metastatic disease. Associated probable malignant pleural
effusions.
2. Mediastinal and hilar lymphadenopathy consistent with metastatic
disease.
3. Hepatic and bilateral adrenal gland metastasis.
4. Severe, diffuse and extensive, destructive metastatic bone
disease involving the pelvis. Other metastatic bone lesions as
detailed above.

Aortic Atherosclerosis ([IU]-[IU]).

## 2021-07-02 MED ORDER — IOHEXOL 350 MG/ML SOLN
100.0000 mL | Freq: Once | INTRAVENOUS | Status: AC | PRN
  Administered 2021-07-02: 80 mL via INTRAVENOUS

## 2021-07-02 MED ORDER — TRAMADOL HCL 50 MG PO TABS: 50.0000 mg | ORAL_TABLET | Freq: Four times a day (QID) | ORAL | 0 refills | Status: DC | PRN

## 2021-07-02 NOTE — Progress Notes (Signed)
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  female with PMH listed below was seen in consultation at the request of  Gayland Curry, MD  for evaluation of tumor in lumbar vertebrae  # patient reports feeling tightness and pain in Feb 2022. She was started on conservative management which did not help her symptoms. She also started to experience numbness.  She takes gabapentin which partially relieve the discomfort. She denies bowel or bladder incontinence, no lower extremity weakness.   06/25/2021 MRI lumbar spine IMPRESSION:  1. 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 Additional metastatic involvement T12, L1 through L3.  No primary tumor site identified. Top differential considerations are Metastatic Disease Unknown primary, less likely Lymphoma or Multiple Myeloma.   2. Superimposed lumbar spine degeneration, including degenerative moderate to severe left L3 and L4 nerve level impingement from disc herniation.    Patient was seen by neurosurgeon Dr.Yarbrough today. He has ordered MRI cervical and thoracic spine and sacrum biopsy for further work up.   Patient has a history of breast cancer, diagnosed in 2010 Left-sided T2 (2.4cm) N0, ER/PR positive, HER2 negative IDC of the breast, s/p lumpectomy by Dr[facility]Meyer at [facility]. Oncotype score of 11 who completed radiation and she finished 10 years of Femara  from 05/2009 and stopped in 2020.   She has intentionally lost some weight, no night sweating, fever.  Former smoker. She endorses mild SOB recently. She has CT chest wo ordered by Dr.Fleming.   Review of Systems  Constitutional:  Negative for appetite change, chills, fatigue and fever.  HENT:   Negative for hearing loss and voice change.   Eyes:  Negative for eye problems.  Respiratory:  Negative for chest tightness and cough.   Cardiovascular:  Negative for chest pain.  Gastrointestinal:  Negative for abdominal distention, abdominal pain and blood in stool.  Endocrine: Negative for hot flashes.  Genitourinary:  Negative for difficulty urinating and frequency.   Musculoskeletal:  Positive for back pain. Negative for arthralgias.       Lower back/sacrum pain and numbness  Skin:  Negative for itching and rash.  Neurological:  Negative for extremity weakness.  Hematological:  Negative for adenopathy.  Psychiatric/Behavioral:  Negative for confusion.    MEDICAL HISTORY:  Past Medical History:  Diagnosis Date   Breast cancer ([place])    High cholesterol    Hypertension    Personal history of radiation therapy     SURGICAL HISTORY: Past Surgical History:  Procedure Laterality Date   BREAST LUMPECTOMY Left    2010    SOCIAL HISTORY: Social History   Socioeconomic History   Marital status: Married    Spouse name: Not on file   Number of children: Not on file   Years of education: Not on file   Highest education level: Not on file  Occupational History   Not on file  Tobacco Use   Smoking status: Former    Packs/day: 1.00    Years: 25.00    Pack years:  25.00    Types: Cigarettes    Quit date: 1990    Years since quitting: 32.7   Smokeless tobacco: Never  Vaping Use   Vaping Use: Never used  Substance and Sexual Activity   Alcohol use: Yes    Comment: occasionally   Drug use: Never   Sexual activity: Not on file  Other Topics Concern   Not on file  Social History  Narrative   Not on file   Social Determinants of Health   Financial Resource Strain: Not on file  Food Insecurity: Not on file  Transportation Needs: Not on file  Physical Activity: Not on file  Stress: Not on file  Social Connections: Not on file  Intimate Partner Violence: Not on file    FAMILY HISTORY: Family History  Problem Relation Age of Onset   Cancer Mother        gynecological   Lung cancer Mother    Diabetes Father    Heart disease Father    Parkinson's disease Father    Brain cancer Brother    Bladder Cancer Brother    Pulmonary disease Brother    Rheumatic fever Brother    Breast cancer Neg Hx     ALLERGIES:  has no allergies on file.  MEDICATIONS:  Current Outpatient Medications  Medication Sig Dispense Refill   albuterol (VENTOLIN HFA) 108 (90 Base) MCG/ACT inhaler Inhale into the lungs.     aspirin 81 MG EC tablet Take by mouth.     atorvastatin (LIPITOR) 40 MG tablet Take by mouth.     Boswellia-Glucosamine-Vit D (OSTEO BI-FLEX-GLUCOS/5-LOXIN) TABS Take 1 capsule by mouth in the morning and at bedtime.     Calcium Carb-Cholecalciferol 600-400 MG-UNIT TABS Take by mouth.     Docusate Sodium (DSS) 100 MG CAPS Take 1 capsule by mouth in the morning and at bedtime.     Fluticasone-Umeclidin-Vilant (TRELEGY ELLIPTA) 100-62.5-25 MCG/INH AEPB Inhale into the lungs. Inhale 1 inhalation into the lungs once daily     gabapentin (NEURONTIN) 300 MG capsule Take 2 capsules by mouth in the morning, at noon, and at bedtime.     ibuprofen (ADVIL) 200 MG tablet Take by mouth. Take 400 mg by mouth every six (6) hours as needed for pain.     letrozole (FEMARA) 2.5 MG tablet Take 1 tablet by mouth daily. Take 1 tablet by mouth once daily     metoprolol-hydrochlorothiazide (LOPRESSOR HCT) 50-25 MG tablet Take 1 tablet by mouth daily. Take 1 tablet by mouth once daily     traMADol (ULTRAM) 50 MG tablet Take 1 tablet (50 mg total) by mouth every 6 (six) hours as needed. 30  tablet 0   No current facility-administered medications for this visit.     PHYSICAL EXAMINATION: ECOG PERFORMANCE STATUS: 1 - Symptomatic but completely ambulatory Vitals:   07/02/21 1429  BP: 122/69  Pulse: 87  Resp: 18  Temp: 98.8 F (37.1 C)   Filed Weights   07/02/21 1429  Weight: 179 lb (81.2 kg)    Physical Exam Constitutional:      General: She is not in acute distress. HENT:     Head: Normocephalic and atraumatic.  Eyes:     General: No scleral icterus. Cardiovascular:     Rate and Rhythm: Normal rate and regular rhythm.     Heart sounds: Normal heart sounds.  Pulmonary:     Effort: Pulmonary effort is normal. No respiratory distress.     Breath sounds: No wheezing.  Comments: Decreased breath sound bilaterally Abdominal:     General: Bowel sounds are normal. There is no distension.     Palpations: Abdomen is soft.  Musculoskeletal:        General: No deformity. Normal range of motion.     Cervical back: Normal range of motion and neck supple.  Skin:    General: Skin is warm and dry.     Findings: No erythema or rash.  Neurological:     Mental Status: She is alert and oriented to person, place, and time. Mental status is at baseline.     Cranial Nerves: No cranial nerve deficit.     Coordination: Coordination normal.  Psychiatric:        Mood and Affect: Mood normal.    LABORATORY DATA:  I have reviewed the data as listed Lab Results  Component Value Date   WBC 12.3 (H) 07/02/2021   HGB 13.3 07/02/2021   HCT 41.2 07/02/2021   MCV 87.7 07/02/2021   PLT 384 07/02/2021   Recent Labs    07/02/21 1604  NA 132*  K 3.6  CL 95*  CO2 26  GLUCOSE 100*  BUN 18  CREATININE 0.75  CALCIUM 9.4  GFRNONAA >60  PROT 8.3*  ALBUMIN 4.3  AST 28  ALT 18  ALKPHOS 107  BILITOT 1.2   Iron/TIBC/Ferritin/ %Sat No results found for: IRON, TIBC, FERRITIN, IRONPCTSAT    RADIOGRAPHIC STUDIES: I have personally reviewed the radiological images as  listed and agreed with the findings in the report. MR Lumbar Spine W Wo Contrast  Addendum Date: 06/27/2021   ADDENDUM REPORT: 06/27/2021 11:15 ADDENDUM: Study discussed by telephone with Dr. Sara Chu (covering for Dr. Gayland Curry) on 06/27/2021 at 11:09 . Electronically Signed   By: Genevie Ann M.D.   On: 06/27/2021 11:15   Result Date: 06/27/2021 CLINICAL DATA:  70 year old female with symptoms beginning in February, numbness in the central low back. Progressive right greater than left low back pain and right leg pain. No known injury. EXAM: MRI LUMBAR SPINE WITHOUT AND WITH CONTRAST TECHNIQUE: Multiplanar and multiecho pulse sequences of the lumbar spine were obtained without and with intravenous contrast. CONTRAST:  15mL GADAVIST GADOBUTROL 1 MMOL/ML IV SOLN COMPARISON:  Lumbar radiographs 02/06/2021. FINDINGS: Segmentation:  Normal on the May radiographs. Alignment: Lumbar lordosis not significantly changed. Occasional mild degenerative spondylolisthesis. Vertebrae: Subtotal tumor replacement of the visible sacrum and pelvis (series 9, image 33) with enhancing tumor filling the sacral spinal canal and bilateral S1 through visible S3 neural foramina. Early circumferential extraosseous extension of tumor from the involved sacral and pelvic bones. Total tumor replacement also of the L5 vertebra, with bulky epidural extension, early extension into the bilateral L5 neural foramina. Subtotal tumor replacement of the L4 vertebra. Ventral epidural tumor and early extension into the right L4 neural foramen. Multifocal tumor metastases in the L3 vertebral body, most notably posteriorly and on the right. Separate bulky tumor of the L3 spinous process with early extraosseous extension. No significant epidural tumor here. Similar patchy metastatic tumor in the central and right L2 body. No epidural tumor. Small round 8 mm tumor metastasis in the left L1 body. Larger and more confluent roughly 18 mm tumor in the  right T12 inferolateral endplate. Smaller separate round 5 mm tumor metastasis in the anterior T12 body. The visible T11 level is spared. Conus medullaris and cauda equina: Conus extends to the L1-L2 level. No lower spinal cord or conus signal abnormality. No abnormal intradural  enhancement is identified. But there is bulky epidural and/or dural tumor beginning at the L4 level and obliterating the spinal canal by S1. Paraspinal and other soft tissues: Early paraspinal extension of tumor from the involved segments as detailed above. In the visible abdomen and pelvis no primary tumor is identified. There is extensive diverticulosis of the distal colon. Disc levels: Superimposed lumbar degenerative changes L2-L3 through L4-L5, including up to moderate degenerative L2 and L4 neural foraminal stenosis, and up to severe degenerative left L3-L4 neural foramen and lateral recess stenosis related to left foraminal and caudal disc herniation seen on series 5, image 10 and series 8, image 23 (severe left L3 and L4 nerve involvement). IMPRESSION: 1. 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 Additional metastatic involvement T12, L1 through L3. No primary tumor site identified. Top differential considerations are Metastatic Disease Unknown primary, less likely Lymphoma or Multiple Myeloma. 2. Superimposed lumbar spine degeneration, including degenerative moderate to severe left L3 and L4 nerve level impingement from disc herniation. Electronically Signed: By: Genevie Ann M.D. On: 06/27/2021 10:46   CT CHEST ABDOMEN PELVIS W CONTRAST  Result Date: 07/02/2021 CLINICAL DATA:  Remote history of breast cancer (2010). New lumbosacral bone lesions found on MRI lumbar spine 06/25/2021 EXAM:  CT CHEST, ABDOMEN, AND PELVIS WITH CONTRAST TECHNIQUE: Multidetector CT imaging of the chest, abdomen and pelvis was performed following the standard protocol during bolus administration of intravenous contrast. CONTRAST:  44m OMNIPAQUE IOHEXOL 350 MG/ML SOLN COMPARISON:  Lumbar spine MRI from 06/25/2021 FINDINGS: CT CHEST FINDINGS Cardiovascular: The heart is normal in size. No pericardial effusion. The aorta is normal in caliber. No dissection. Moderate atherosclerotic calcifications. Scattered coronary artery calcifications. Mediastinum/Nodes: Mediastinal and hilar lymphadenopathy consistent with metastatic disease. Pretracheal lymph node on image 21/2 measures 20 mm Prevascular node on image 21/2 measures 12 mm. Right hilar node on image 25/2 measures 19 mm. Subcarinal node on image 28/2 measures 14 mm The esophagus is grossly normal. Lungs/Pleura: Innumerable small pulmonary and pleural nodules involving both lungs consistent with diffuse metastatic disease. There are associated bilateral pleural effusions likely malignant. Musculoskeletal: No breast masses, supraclavicular or axillary adenopathy. The thyroid gland is unremarkable. Mixed lytic and sclerotic metastatic bone disease most notably affecting T3 and T7. Mild pathologic compression fracture of T7 canal compromise. There is also a small lytic lesion involving T12. Suspect a mildly sclerotic lesion involving the manubrium of the sternum. Subtle mixed lytic and sclerotic lesion involving the upper sternum with some erosion of the posterior cortex. CT ABDOMEN PELVIS FINDINGS Hepatobiliary: There are 5 vague low-attenuation hepatic lesions consistent with metastatic disease. 17 mm lesion in segment 8, 11.5 mm lesion in segment 3, 10 mm lesion in segment 4B, 15 mm lesion lower in segment 4B and 15 mm lesion in segment 6. The gallbladder is unremarkable.  No common bile duct dilatation. Pancreas: No mass, inflammation or ductal dilatation. Spleen: Normal  size.  No focal lesions. Adrenals/Urinary Tract: Bilateral adrenal gland lesions likely metastatic disease. The largest lesion measures 13 mm in the left gland. The kidneys are unremarkable. No renal lesions or hydronephrosis. The bladder is unremarkable. Stomach/Bowel: The stomach, duodenum, small bowel and colon are grossly normal. Vascular/Lymphatic: Moderate atherosclerotic calcifications involving the aorta and branch vessels but no aneurysm or dissection. No mesenteric or  retroperitoneal mass or adenopathy. Reproductive: The uterus and ovaries are unremarkable. Other: No pelvic mass or adenopathy. No free pelvic fluid collections. No inguinal mass or adenopathy. No abdominal wall hernia or subcutaneous lesions. Musculoskeletal: Severe destructive metastatic bone disease involving the lower lumbar vertebral bodies, the sacrum and iliac bones. The sacrum is largely destroyed. No definite involvement the hips. IMPRESSION: 1. Innumerable small pulmonary and pleural nodules consistent with diffuse metastatic disease. Associated probable malignant pleural effusions. 2. Mediastinal and hilar lymphadenopathy consistent with metastatic disease. 3. Hepatic and bilateral adrenal gland metastasis. 4. Severe, diffuse and extensive, destructive metastatic bone disease involving the pelvis. Other metastatic bone lesions as detailed above. Aortic Atherosclerosis (ICD10-I70.0). Electronically Signed   By: Marijo Sanes M.D.   On: 07/02/2021 18:38   MM 3D SCREEN BREAST BILATERAL  Result Date: 06/16/2021 CLINICAL DATA:  Screening. EXAM: DIGITAL SCREENING BILATERAL MAMMOGRAM WITH TOMOSYNTHESIS AND CAD TECHNIQUE: Bilateral screening digital craniocaudal and mediolateral oblique mammograms were obtained. Bilateral screening digital breast tomosynthesis was performed. The images were evaluated with computer-aided detection. COMPARISON:  Previous exam(s). ACR Breast Density Category b: There are scattered areas of fibroglandular  density. FINDINGS: There are no findings suspicious for malignancy. IMPRESSION: No mammographic evidence of malignancy. A result letter of this screening mammogram will be mailed directly to the patient. RECOMMENDATION: Screening mammogram in one year. (Code:SM-B-01Y) BI-RADS CATEGORY  1: Negative. Electronically Signed   By: Lillia Mountain M.D.   On: 06/16/2021 09:46     ASSESSMENT & PLAN:  1. Metastasis to bone ([place])   2. History of breast cancer   3. Neoplasm related pain    # Metastatic bone lesion.  MRI images are reviewed and discussed with patient. Differential diagnosis includes metastatic solid organ malignancy, multiple myeloma, lymphoma, etc. She has history of breast cancer, and metastatic breast cancer is a concern. Former smoker, metastatic lung cancer also is possible. MRI cervical and thoracic spine and sacrum biopsy have been ordered by Dr.Yarbrough.  I recommend to obtain STAT CT chest abdomen pelvis  Check cbc, cmp, LDH, flowcytometry, SPEP, CA 15.3, CA 27.29 Refer to establish care with Dr.Crystal.  # Neoplasm related pain,  On gabapentin and PRN Tylenol.  Tramadol 45m PRN rx sent. Rationale and side effects were discussed.  After biopsy, consider to start steroid   07/02/2021 CT chest abdomen pelvis showed extensive metastatic disease with innumerable small lung nodules with mediastinal and hilar lymphadenopathy, bilateral pleural effusion, hepatic lesions and bilateral adrenal gland lesions. Diffuse/extensive destructive metastatic bone disease.   Future plan depends on her sacrum biopsy results.    Orders Placed This Encounter  Procedures   CT CHEST ABDOMEN PELVIS W CONTRAST    Standing Status:   Future    Number of Occurrences:   1    Standing Expiration Date:   07/02/2022    Order Specific Question:   Preferred imaging location?    Answer:   [facility]    Order Specific Question:   Is Oral Contrast requested for this exam?    Answer:   Yes, Per Radiology  protocol    Order Specific Question:   Call Results- Best Contact Number?    Answer::   [phone] pt can leave after exam is complete     Order Specific Question:   Reason for Exam (SYMPTOM  OR DIAGNOSIS REQUIRED)    Answer:   bone lesion   CBC with Differential/Platelet    Standing Status:   Future    Number of Occurrences:  1    Standing Expiration Date:   07/02/2022   Comprehensive metabolic panel    Standing Status:   Future    Number of Occurrences:   1    Standing Expiration Date:   07/02/2022   Flow cytometry panel-leukemia/lymphoma work-up    Standing Status:   Future    Number of Occurrences:   1    Standing Expiration Date:   07/02/2022   Kappa/lambda light chains    Standing Status:   Future    Number of Occurrences:   1    Standing Expiration Date:   07/02/2022   Multiple Myeloma Panel (SPEP&IFE w/QIG)    Standing Status:   Future    Number of Occurrences:   1    Standing Expiration Date:   07/02/2022   Lactate dehydrogenase    Standing Status:   Future    Number of Occurrences:   1    Standing Expiration Date:   07/02/2022   Cancer antigen 27.29    Standing Status:   Future    Number of Occurrences:   1    Standing Expiration Date:   07/02/2022   Cancer antigen 15-3    Standing Status:   Future    Number of Occurrences:   1    Standing Expiration Date:   07/02/2022   Ambulatory referral to Radiation Oncology    Referral Priority:   Routine    Referral Type:   Consultation    Referral Reason:   Specialty Services Required    Requested Specialty:   Radiation Oncology    Number of Visits Requested:   1    All questions were answered. The patient knows to call the clinic with any problems questions or concerns.   Gayland Curry, MD    Return of visit:  Thank you for this kind referral and the opportunity to participate in the care of this patient. A copy of today's note is routed to referring provider    [name], MD, PhD Hematology Oncology [place] [facility]  07/02/2021

## 2021-07-02 NOTE — Progress Notes (Signed)
Patient here to establish care  

## 2021-07-03 ENCOUNTER — Other Ambulatory Visit: Payer: Self-pay | Admitting: Neurosurgery

## 2021-07-03 ENCOUNTER — Encounter: Admission: RE | Payer: Self-pay | Source: Ambulatory Visit

## 2021-07-03 ENCOUNTER — Other Ambulatory Visit (HOSPITAL_COMMUNITY): Payer: Self-pay | Admitting: Neurosurgery

## 2021-07-03 ENCOUNTER — Ambulatory Visit: Admission: RE | Admit: 2021-07-03 | Payer: Medicare PPO | Source: Ambulatory Visit

## 2021-07-03 DIAGNOSIS — C7951 Secondary malignant neoplasm of bone: Secondary | ICD-10-CM

## 2021-07-03 SURGERY — COLONOSCOPY WITH PROPOFOL
Anesthesia: General

## 2021-07-04 ENCOUNTER — Inpatient Hospital Stay: Payer: Medicare PPO

## 2021-07-04 ENCOUNTER — Other Ambulatory Visit: Payer: Self-pay | Admitting: Neurosurgery

## 2021-07-04 ENCOUNTER — Inpatient Hospital Stay: Payer: Medicare PPO | Admitting: Oncology

## 2021-07-04 DIAGNOSIS — C7951 Secondary malignant neoplasm of bone: Secondary | ICD-10-CM

## 2021-07-04 LAB — CANCER ANTIGEN 15-3: CA 15-3: 655 U/mL — ABNORMAL HIGH (ref 0.0–25.0)

## 2021-07-04 LAB — KAPPA/LAMBDA LIGHT CHAINS
Kappa free light chain: 26.1 mg/L — ABNORMAL HIGH (ref 3.3–19.4)
Kappa, lambda light chain ratio: 2.31 — ABNORMAL HIGH (ref 0.26–1.65)
Lambda free light chains: 11.3 mg/L (ref 5.7–26.3)

## 2021-07-04 LAB — CANCER ANTIGEN 27.29: CA 27.29: 668.2 U/mL — ABNORMAL HIGH (ref 0.0–38.6)

## 2021-07-05 LAB — COMP PANEL: LEUKEMIA/LYMPHOMA

## 2021-07-07 LAB — MULTIPLE MYELOMA PANEL, SERUM
Albumin SerPl Elph-Mcnc: 3.8 g/dL (ref 2.9–4.4)
Albumin/Glob SerPl: 1.4 (ref 0.7–1.7)
Alpha 1: 0.3 g/dL (ref 0.0–0.4)
Alpha2 Glob SerPl Elph-Mcnc: 0.9 g/dL (ref 0.4–1.0)
B-Globulin SerPl Elph-Mcnc: 1.1 g/dL (ref 0.7–1.3)
Gamma Glob SerPl Elph-Mcnc: 0.5 g/dL (ref 0.4–1.8)
Globulin, Total: 2.9 g/dL (ref 2.2–3.9)
IgA: 188 mg/dL (ref 64–422)
IgG (Immunoglobin G), Serum: 752 mg/dL (ref 586–1602)
IgM (Immunoglobulin M), Srm: 78 mg/dL (ref 26–217)
Total Protein ELP: 6.7 g/dL (ref 6.0–8.5)

## 2021-07-08 ENCOUNTER — Telehealth: Payer: Self-pay | Admitting: *Deleted

## 2021-07-08 NOTE — Telephone Encounter (Signed)
Please schedule patient for MD only 1 week after biopsy (for results)

## 2021-07-08 NOTE — Telephone Encounter (Signed)
Patient called to let Dr Tasia Catchings know that her biopsy is scheduled for 07/16/21 @ 10 AM

## 2021-07-09 ENCOUNTER — Ambulatory Visit: Payer: Medicare PPO

## 2021-07-09 ENCOUNTER — Other Ambulatory Visit: Payer: Self-pay | Admitting: Radiology

## 2021-07-10 ENCOUNTER — Encounter: Payer: Self-pay | Admitting: Radiation Oncology

## 2021-07-10 ENCOUNTER — Other Ambulatory Visit: Payer: Self-pay

## 2021-07-10 ENCOUNTER — Ambulatory Visit
Admission: RE | Admit: 2021-07-10 | Discharge: 2021-07-10 | Disposition: A | Discharge status: Home / Self Care | Payer: Medicare PPO | Source: Ambulatory Visit | Attending: Radiation Oncology | Admitting: Radiation Oncology

## 2021-07-10 VITALS — BP 146/77 | HR 101 | Resp 16 | Wt 182.0 lb

## 2021-07-10 DIAGNOSIS — Z923 Personal history of irradiation: Secondary | ICD-10-CM | POA: Insufficient documentation

## 2021-07-10 DIAGNOSIS — C7951 Secondary malignant neoplasm of bone: Secondary | ICD-10-CM

## 2021-07-10 DIAGNOSIS — Z8052 Family history of malignant neoplasm of bladder: Secondary | ICD-10-CM | POA: Insufficient documentation

## 2021-07-10 DIAGNOSIS — Z800 Family history of malignant neoplasm of digestive organs: Secondary | ICD-10-CM | POA: Insufficient documentation

## 2021-07-10 DIAGNOSIS — Z79899 Other long term (current) drug therapy: Secondary | ICD-10-CM | POA: Insufficient documentation

## 2021-07-10 DIAGNOSIS — Z87891 Personal history of nicotine dependence: Secondary | ICD-10-CM | POA: Insufficient documentation

## 2021-07-10 DIAGNOSIS — I10 Essential (primary) hypertension: Secondary | ICD-10-CM | POA: Insufficient documentation

## 2021-07-10 DIAGNOSIS — E7800 Pure hypercholesterolemia, unspecified: Secondary | ICD-10-CM | POA: Insufficient documentation

## 2021-07-10 DIAGNOSIS — C7800 Secondary malignant neoplasm of unspecified lung: Secondary | ICD-10-CM | POA: Insufficient documentation

## 2021-07-10 DIAGNOSIS — J9600 Acute respiratory failure, unspecified whether with hypoxia or hypercapnia: Secondary | ICD-10-CM | POA: Diagnosis not present

## 2021-07-10 DIAGNOSIS — C787 Secondary malignant neoplasm of liver and intrahepatic bile duct: Secondary | ICD-10-CM | POA: Insufficient documentation

## 2021-07-10 DIAGNOSIS — Z801 Family history of malignant neoplasm of trachea, bronchus and lung: Secondary | ICD-10-CM | POA: Insufficient documentation

## 2021-07-10 DIAGNOSIS — Z853 Personal history of malignant neoplasm of breast: Secondary | ICD-10-CM | POA: Insufficient documentation

## 2021-07-10 DIAGNOSIS — C7970 Secondary malignant neoplasm of unspecified adrenal gland: Secondary | ICD-10-CM | POA: Insufficient documentation

## 2021-07-10 HISTORY — PX: OTHER SURGICAL HISTORY: SHX169

## 2021-07-10 NOTE — Consult Note (Signed)
NEW PATIENT EVALUATION  Name: Amber Blevins  MRN: [id number]  Date:   07/10/2021     DOB: 1951-01-19   This 70 y.o. female patient presents to the clinic for initial evaluation of palliative radiation therapy to lumbar spine and sacrum in patient with metastatic disease status post treatment for breast cancer back in 2010.  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   PREVIOUS INVESTIGATIONS:  MRI scans of lumbar spine reviewed CT chest abdomen pelvis reviewed Clinical notes reviewed Pathology r pending  HPI: Patient is a 70 year old female status posttreatment for breast cancer back in 2010.  She has been having some intermittent lower back pain since February and started having some lower extremity numbness.  She underwent an MRI at the end of September of her lumbar spine showing extensive malignant tumor replacing the bones of the lower lumbar vertebral bodies L4-5 as well as large sacral and pelvic metastasis.  She also has extensive osseous metastasis involving T12 L1-L3.  Her initial breast cancer was left-sided was a T2 lesion 2.4 cm ER/PR positive HER2/neu not overexpressed.  She was treated at [facility] for radiation.  She had Oncotype score of 11 and had been on 10 years of Femara.  She is seen today for consideration of palliative radiation therapy to her L-spine as well as her sacrum.  She is ambulating well no focal neurologic deficits are noted.  PLANNED TREATMENT REGIMEN: Palliative radiation therapy to lumbar spine and sacrum  PAST MEDICAL HISTORY:  has a past medical history of Breast cancer ([place]), High cholesterol, Hypertension, and Personal history of radiation therapy.    PAST SURGICAL HISTORY:  Past Surgical History:  Procedure  Laterality Date   BREAST LUMPECTOMY Left    2010    FAMILY HISTORY: family history includes Bladder Cancer in her brother; Brain cancer in her brother; Cancer in her mother; Diabetes in her father; Heart disease in her father; Lung cancer in her mother; Parkinson's disease in her father; Pulmonary disease in her brother; Rheumatic fever in her brother.  SOCIAL HISTORY:  reports that she quit smoking about 32 years ago. Her smoking use included cigarettes. She has a 25.00 pack-year smoking history. She has never used smokeless tobacco. She reports current alcohol use. She reports that she does not use drugs.  ALLERGIES: Green tea (camellia sinensis), Melaleuca viridiflora, and Lisinopril  MEDICATIONS:  Current Outpatient Medications  Medication Sig Dispense Refill   albuterol (VENTOLIN HFA) 108 (90 Base) MCG/ACT inhaler Inhale into the lungs.     aspirin 81 MG EC tablet Take by mouth.     atorvastatin (LIPITOR) 40 MG tablet Take by mouth.     Boswellia-Glucosamine-Vit D (OSTEO BI-FLEX-GLUCOS/5-LOXIN) TABS Take 1 capsule by mouth in the morning and at bedtime.     Calcium Carb-Cholecalciferol 600-400 MG-UNIT TABS Take by mouth.     Docusate Sodium (DSS) 100 MG CAPS Take 1 capsule by mouth in the morning and at bedtime.     Fluticasone-Umeclidin-Vilant (TRELEGY ELLIPTA) 100-62.5-25 MCG/INH AEPB Inhale into the lungs. Inhale 1 inhalation into the lungs once daily     gabapentin (NEURONTIN) 300 MG capsule Take 2 capsules by mouth in the morning, at noon, and at bedtime.     ibuprofen (ADVIL) 200 MG tablet Take by mouth. Take  400 mg by mouth every six (6) hours as needed for pain.     letrozole (FEMARA) 2.5 MG tablet Take 1 tablet by mouth daily. Take 1 tablet by mouth once daily     metoprolol-hydrochlorothiazide (LOPRESSOR HCT) 50-25 MG tablet Take 1 tablet by mouth daily. Take 1 tablet by mouth once daily     traMADol (ULTRAM) 50 MG tablet Take 1 tablet (50 mg total) by mouth every 6 (six)  hours as needed. 30 tablet 0   No current facility-administered medications for this encounter.    ECOG PERFORMANCE STATUS:  1 - Symptomatic but completely ambulatory  REVIEW OF SYSTEMS: Patient denies any weight loss, fatigue, weakness, fever, chills or night sweats. Patient denies any loss of vision, blurred vision. Patient denies any ringing  of the ears or hearing loss. No irregular heartbeat. Patient denies heart murmur or history of fainting. Patient denies any chest pain or pain radiating to her upper extremities. Patient denies any shortness of breath, difficulty breathing at night, cough or hemoptysis. Patient denies any swelling in the lower legs. Patient denies any nausea vomiting, vomiting of blood, or coffee ground material in the vomitus. Patient denies any stomach pain. Patient states has had normal bowel movements no significant constipation or diarrhea. Patient denies any dysuria, hematuria or significant nocturia. Patient denies any problems walking, swelling in the joints or loss of balance. Patient denies any skin changes, loss of hair or loss of weight. Patient denies any excessive worrying or anxiety or significant depression. Patient denies any problems with insomnia. Patient denies excessive thirst, polyuria, polydipsia. Patient denies any swollen glands, patient denies easy bruising or easy bleeding. Patient denies any recent infections, allergies or URI. Patient "s visual fields have not changed significantly in recent time.   PHYSICAL EXAM: BP (!) 146/77 (Patient Position: Sitting)   Pulse (!) 101   Resp 16   Wt 182 lb (82.6 kg)   BMI 30.29 kg/m  Motor or sensory in detail levels are equal and symmetric in lower extremities proprioception is intact.  Well-developed well-nourished patient in NAD. HEENT reveals PERLA, EOMI, discs not visualized.  Oral cavity is clear. No oral mucosal lesions are identified. Neck is clear without evidence of cervical or supraclavicular  adenopathy. Lungs are clear to A&P. Cardiac examination is essentially unremarkable with regular rate and rhythm without murmur rub or thrill. Abdomen is benign with no organomegaly or masses noted. Motor sensory and DTR levels are equal and symmetric in the upper and lower extremities. Cranial nerves II through XII are grossly intact. Proprioception is intact. No peripheral adenopathy or edema is identified. No motor or sensory levels are noted. Crude visual fields are within normal range.  LABORATORY DATA: Laboratory results are reviewed pathology is pending    RADIOLOGY RESULTS: MRI scans and CT scans reviewed compatible with above-stated findings   IMPRESSION: Metastatic disease probably of breast origin to her lumbar spine and sacrum in 70 year old female  PLAN: At this time I have recommended palliative radiation therapy to her lumbar spine and sacrum.  Based on the large area would treat in a hypofractionated course of 30 Gray in 15 fractions to avoid unnecessary bowel toxicity covering such a large area of spine and sacrum.  Risks and benefits of treatment including possible diarrhea fatigue alteration of blood count skin reaction all were discussed in detail with the patient she seems to comprehend my treatment plan well.  We will go ahead and start planning simulation prior to her obtaining pathology  and hopefully get her under treatment as soon as possible sometime next week.  I would like to take this opportunity to thank you for allowing me to participate in the care of your patient.Noreene Filbert, MD

## 2021-07-11 ENCOUNTER — Other Ambulatory Visit: Payer: Self-pay

## 2021-07-11 ENCOUNTER — Ambulatory Visit (INDEPENDENT_AMBULATORY_CARE_PROVIDER_SITE_OTHER)
Admission: EM | Admit: 2021-07-11 | Discharge: 2021-07-11 | Disposition: A | Discharge status: Home / Self Care | Payer: Medicare PPO | Source: Home / Self Care

## 2021-07-11 ENCOUNTER — Emergency Department: Payer: Medicare PPO

## 2021-07-11 ENCOUNTER — Inpatient Hospital Stay
Admission: EM | Admit: 2021-07-11 | Discharge: 2021-07-14 | DRG: 542 | Disposition: A | Discharge status: Home / Self Care | Payer: Medicare PPO | Source: Ambulatory Visit | Attending: Family Medicine | Admitting: Family Medicine

## 2021-07-11 ENCOUNTER — Encounter: Payer: Self-pay | Admitting: Emergency Medicine

## 2021-07-11 ENCOUNTER — Telehealth: Payer: Self-pay

## 2021-07-11 DIAGNOSIS — Z20822 Contact with and (suspected) exposure to covid-19: Secondary | ICD-10-CM | POA: Insufficient documentation

## 2021-07-11 DIAGNOSIS — Z7951 Long term (current) use of inhaled steroids: Secondary | ICD-10-CM

## 2021-07-11 DIAGNOSIS — Z8249 Family history of ischemic heart disease and other diseases of the circulatory system: Secondary | ICD-10-CM | POA: Insufficient documentation

## 2021-07-11 DIAGNOSIS — I10 Essential (primary) hypertension: Secondary | ICD-10-CM | POA: Diagnosis present

## 2021-07-11 DIAGNOSIS — I700 Atherosclerosis of aorta: Secondary | ICD-10-CM

## 2021-07-11 DIAGNOSIS — Z9889 Other specified postprocedural states: Secondary | ICD-10-CM

## 2021-07-11 DIAGNOSIS — C7951 Secondary malignant neoplasm of bone: Principal | ICD-10-CM | POA: Diagnosis present

## 2021-07-11 DIAGNOSIS — Z8052 Family history of malignant neoplasm of bladder: Secondary | ICD-10-CM

## 2021-07-11 DIAGNOSIS — Z853 Personal history of malignant neoplasm of breast: Secondary | ICD-10-CM | POA: Insufficient documentation

## 2021-07-11 DIAGNOSIS — Z7982 Long term (current) use of aspirin: Secondary | ICD-10-CM

## 2021-07-11 DIAGNOSIS — Z791 Long term (current) use of non-steroidal anti-inflammatories (NSAID): Secondary | ICD-10-CM | POA: Insufficient documentation

## 2021-07-11 DIAGNOSIS — R0902 Hypoxemia: Secondary | ICD-10-CM

## 2021-07-11 DIAGNOSIS — Z888 Allergy status to other drugs, medicaments and biological substances status: Secondary | ICD-10-CM

## 2021-07-11 DIAGNOSIS — Z820 Family history of epilepsy and other diseases of the nervous system: Secondary | ICD-10-CM

## 2021-07-11 DIAGNOSIS — C7971 Secondary malignant neoplasm of right adrenal gland: Secondary | ICD-10-CM | POA: Diagnosis present

## 2021-07-11 DIAGNOSIS — Z923 Personal history of irradiation: Secondary | ICD-10-CM

## 2021-07-11 DIAGNOSIS — Z87891 Personal history of nicotine dependence: Secondary | ICD-10-CM

## 2021-07-11 DIAGNOSIS — C801 Malignant (primary) neoplasm, unspecified: Secondary | ICD-10-CM

## 2021-07-11 DIAGNOSIS — E7800 Pure hypercholesterolemia, unspecified: Secondary | ICD-10-CM | POA: Diagnosis present

## 2021-07-11 DIAGNOSIS — Z79899 Other long term (current) drug therapy: Secondary | ICD-10-CM

## 2021-07-11 DIAGNOSIS — R0603 Acute respiratory distress: Secondary | ICD-10-CM

## 2021-07-11 DIAGNOSIS — Z808 Family history of malignant neoplasm of other organs or systems: Secondary | ICD-10-CM

## 2021-07-11 DIAGNOSIS — J9601 Acute respiratory failure with hypoxia: Secondary | ICD-10-CM

## 2021-07-11 DIAGNOSIS — Z833 Family history of diabetes mellitus: Secondary | ICD-10-CM

## 2021-07-11 DIAGNOSIS — R918 Other nonspecific abnormal finding of lung field: Secondary | ICD-10-CM | POA: Diagnosis present

## 2021-07-11 DIAGNOSIS — J189 Pneumonia, unspecified organism: Secondary | ICD-10-CM

## 2021-07-11 DIAGNOSIS — C7972 Secondary malignant neoplasm of left adrenal gland: Secondary | ICD-10-CM | POA: Diagnosis present

## 2021-07-11 DIAGNOSIS — J910 Malignant pleural effusion: Secondary | ICD-10-CM | POA: Diagnosis present

## 2021-07-11 DIAGNOSIS — Z79811 Long term (current) use of aromatase inhibitors: Secondary | ICD-10-CM

## 2021-07-11 DIAGNOSIS — J90 Pleural effusion, not elsewhere classified: Secondary | ICD-10-CM

## 2021-07-11 DIAGNOSIS — Z801 Family history of malignant neoplasm of trachea, bronchus and lung: Secondary | ICD-10-CM

## 2021-07-11 LAB — BASIC METABOLIC PANEL WITH GFR
Anion gap: 11 (ref 5–15)
BUN: 18 mg/dL (ref 8–23)
CO2: 27 mmol/L (ref 22–32)
Calcium: 9.8 mg/dL (ref 8.9–10.3)
Chloride: 99 mmol/L (ref 98–111)
Creatinine, Ser: 0.72 mg/dL (ref 0.44–1.00)
GFR, Estimated: >60 mL/min
Glucose, Bld: 110 mg/dL — ABNORMAL HIGH (ref 70–99)
Potassium: 3.7 mmol/L (ref 3.5–5.1)
Sodium: 137 mmol/L (ref 135–145)

## 2021-07-11 LAB — CBC
HCT: 41.3 % (ref 36.0–46.0)
Hemoglobin: 14.2 g/dL (ref 12.0–15.0)
MCH: 29.5 pg (ref 26.0–34.0)
MCHC: 34.4 g/dL (ref 30.0–36.0)
MCV: 85.7 fL (ref 80.0–100.0)
Platelets: 389 10*3/uL (ref 150–400)
RBC: 4.82 MIL/uL (ref 3.87–5.11)
RDW: 14.2 % (ref 11.5–15.5)
WBC: 10.9 10*3/uL — ABNORMAL HIGH (ref 4.0–10.5)
nRBC: 0 % (ref 0.0–0.2)

## 2021-07-11 LAB — POC SARS CORONAVIRUS 2 AG: SARSCOV2ONAVIRUS 2 AG: NEGATIVE

## 2021-07-11 LAB — BRAIN NATRIURETIC PEPTIDE: B Natriuretic Peptide: 30 pg/mL (ref 0.0–100.0)

## 2021-07-11 LAB — TROPONIN I (HIGH SENSITIVITY): Troponin I (High Sensitivity): 6 ng/L (ref <18)

## 2021-07-11 IMAGING — CT CT ANGIO CHEST
2 of 6 series · 18 of 46 positions shown · IV contrast (APPLIED)
Comparison: None.

CLINICAL DATA: Shortness of breath

EXAM:
CT ANGIOGRAPHY CHEST WITH CONTRAST
TECHNIQUE: Multidetector CT imaging of the chest was performed using the
standard protocol during bolus administration of intravenous
contrast. Multiplanar CT image reconstructions and MIPs were
obtained to evaluate the vascular anatomy.
CONTRAST:  75mL OMNIPAQUE IOHEXOL 350 MG/ML SOLN

[Series 5: thins · axial · 0.71mm/px · z∈[-764,-520]mm · 15 of 333 slices shown]
[im 14/333  lung]
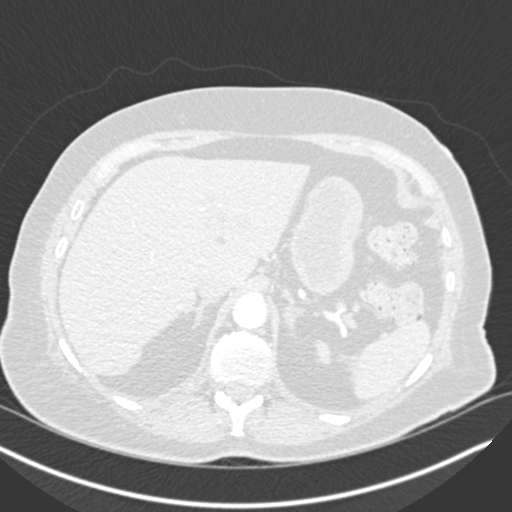
[im 42/333  soft-tissue]
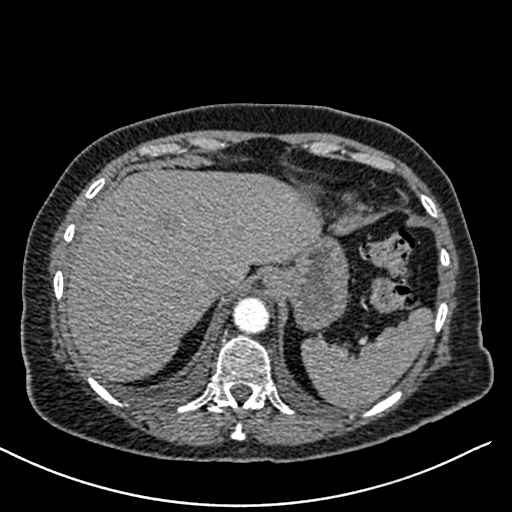
[im 56/333  lung]
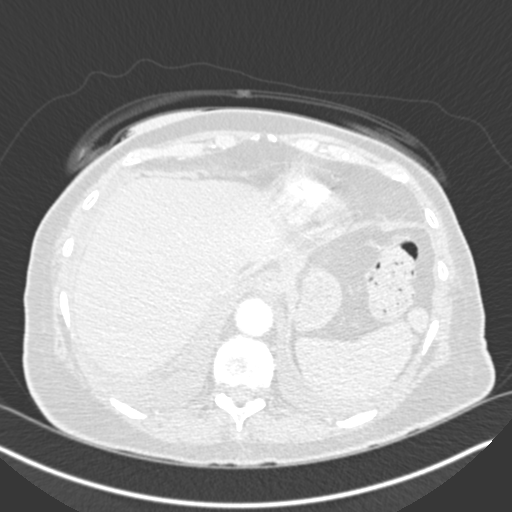
[im 84/333  soft-tissue]
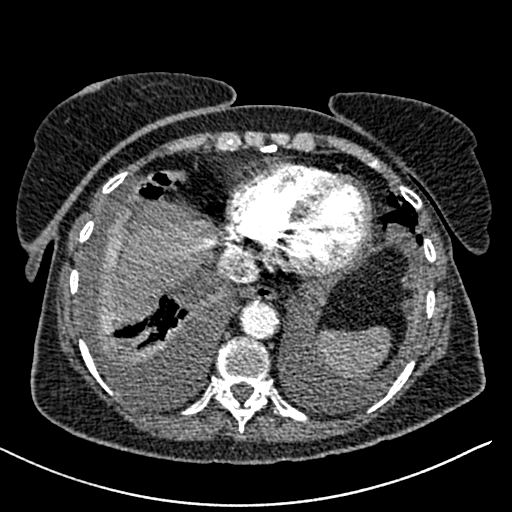
[im 97/333  lung]
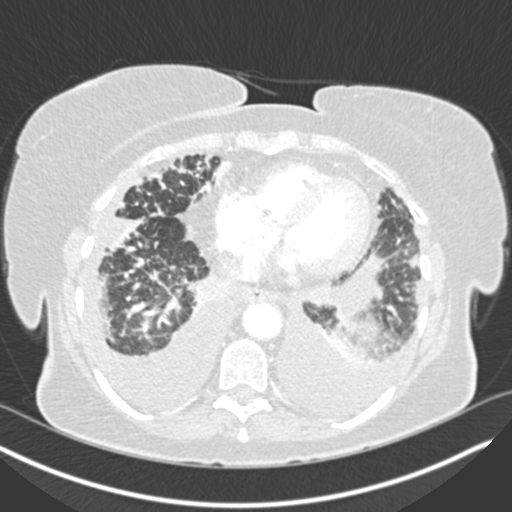
[im 125/333  soft-tissue]
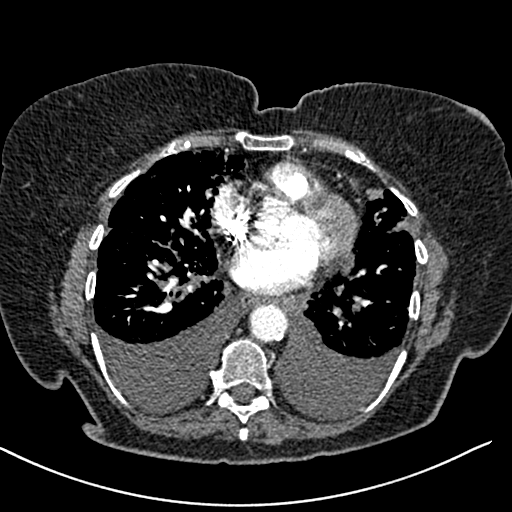
[im 139/333  lung]
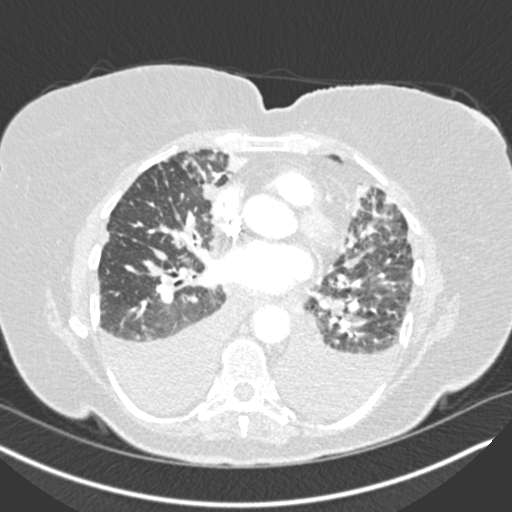
[im 167/333  soft-tissue]
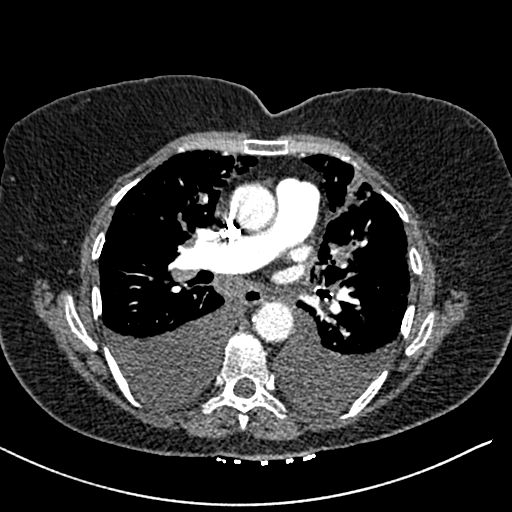
[im 194/333  lung]
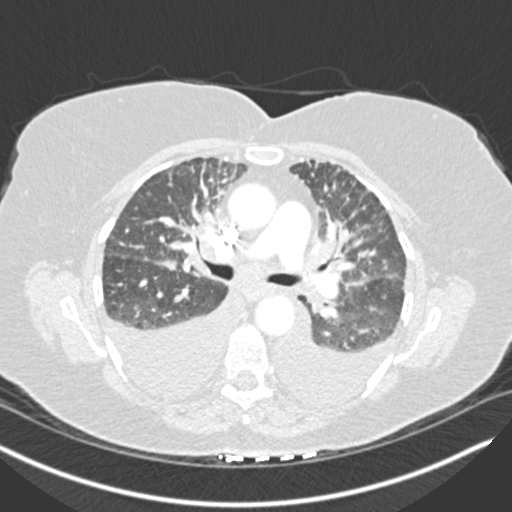
[im 208/333  soft-tissue]
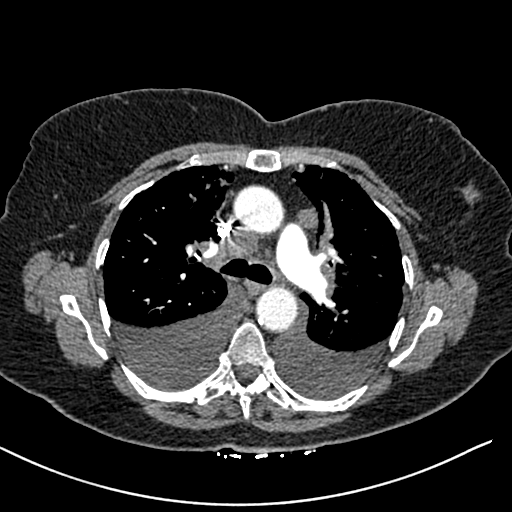
[im 236/333  lung]
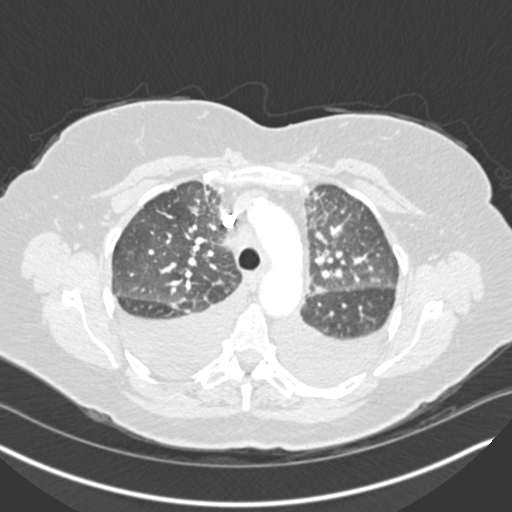
[im 250/333  soft-tissue]
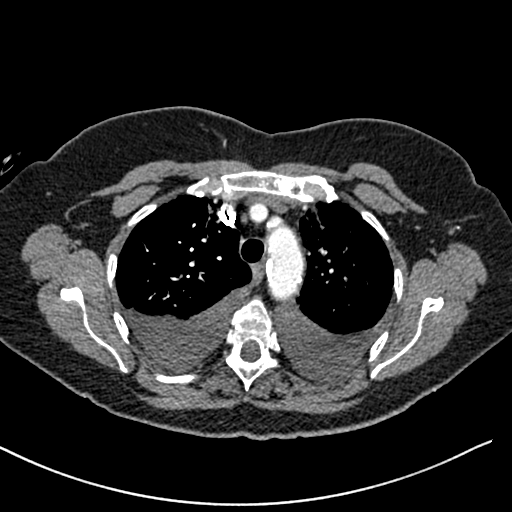
[im 277/333  lung]
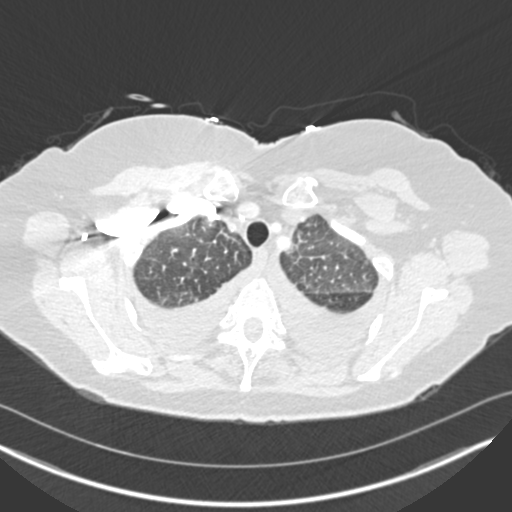
[im 291/333  soft-tissue]
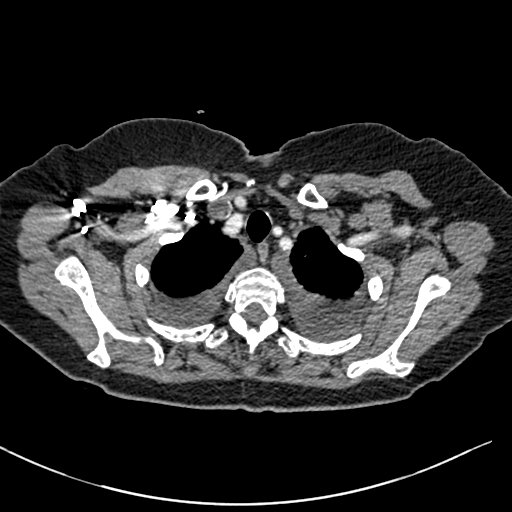
[im 319/333  lung]
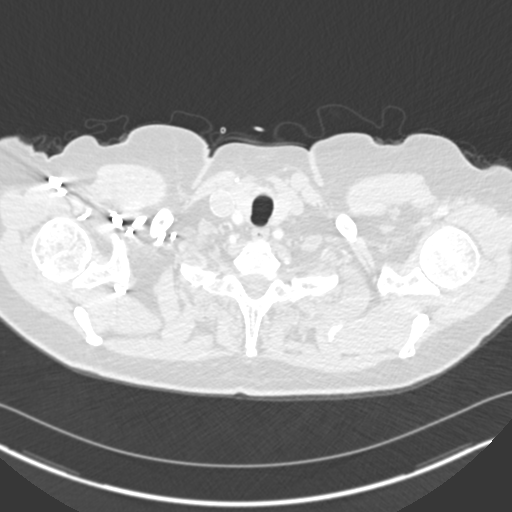

[Series 7: coronal mpr · coronal · 0.56mm/px · 3 of 97 slices shown]
[im 25/97  soft-tissue]
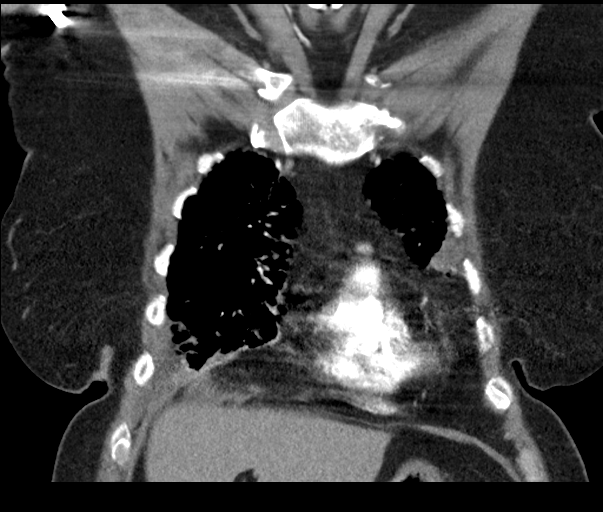
[im 49/97  soft-tissue]
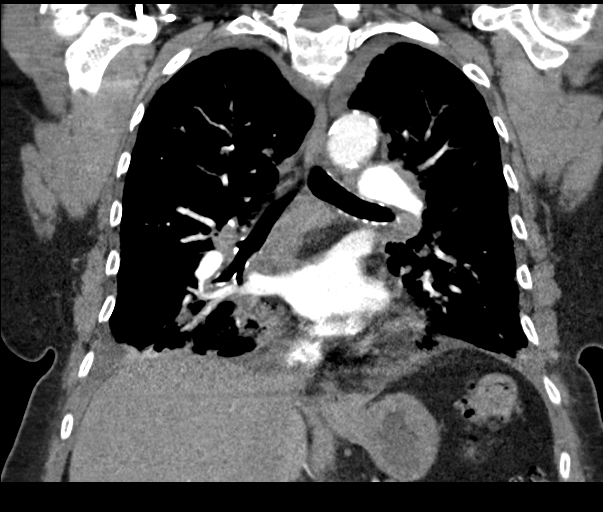
[im 73/97  soft-tissue]
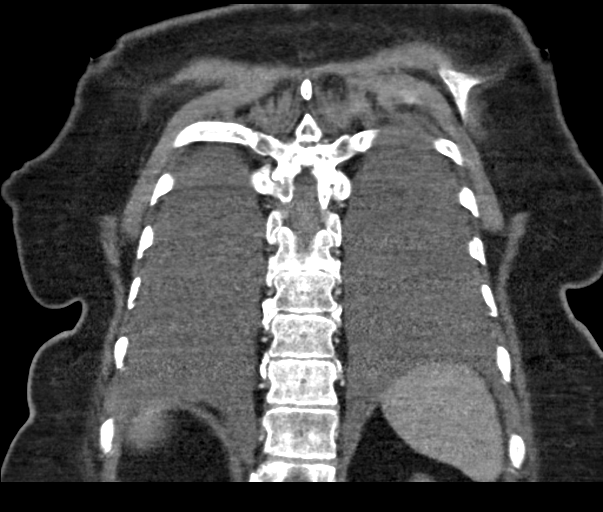

[18 of 46 positions shown; findings below may reference images not displayed]

FINDINGS: Cardiovascular: Contrast injection is sufficient to demonstrate
satisfactory opacification of the pulmonary arteries to the
segmental level. There is no pulmonary embolus or evidence of right
heart strain. The size of the main pulmonary artery is normal. Heart
size is normal, with no pericardial effusion. The course and caliber
of the aorta are normal. There is atherosclerotic calcification. No
acute aortic syndrome.

Mediastinum/Nodes: There is mediastinal and hilar lymphadenopathy
that is unchanged. Right hilar nodes cause marked narrowing of right
middle lobe arterial branches. No axillary adenopathy is visible.
Soft tissue focus in the outer left breast is unchanged.

Lungs/Pleura: Large pleural effusions. Airways are patent. There is
mild-to-moderate interstitial pulmonary edema.

Upper Abdomen: Contrast bolus timing is not optimized for evaluation
of the abdominal organs. Incompletely visualized left adrenal nodule
measures at least 11 mm.

Musculoskeletal: No chest wall abnormality. No bony spinal canal
stenosis.

Review of the MIP images confirms the above findings.
IMPRESSION: 1. No pulmonary embolus or acute aortic syndrome.
2. Large pleural effusions and mild-to-moderate interstitial
pulmonary edema.
3. Bulky mediastinal and hilar lymphadenopathy, unchanged.
4. Incompletely visualized left adrenal nodule.

## 2021-07-11 IMAGING — CR DG CHEST 2V
2 series · 2 of 2 positions shown · non-contrast
Comparison: None.

CLINICAL DATA: Worsening shortness of breath.

EXAM:
CHEST - 2 VIEW

[chest pa]
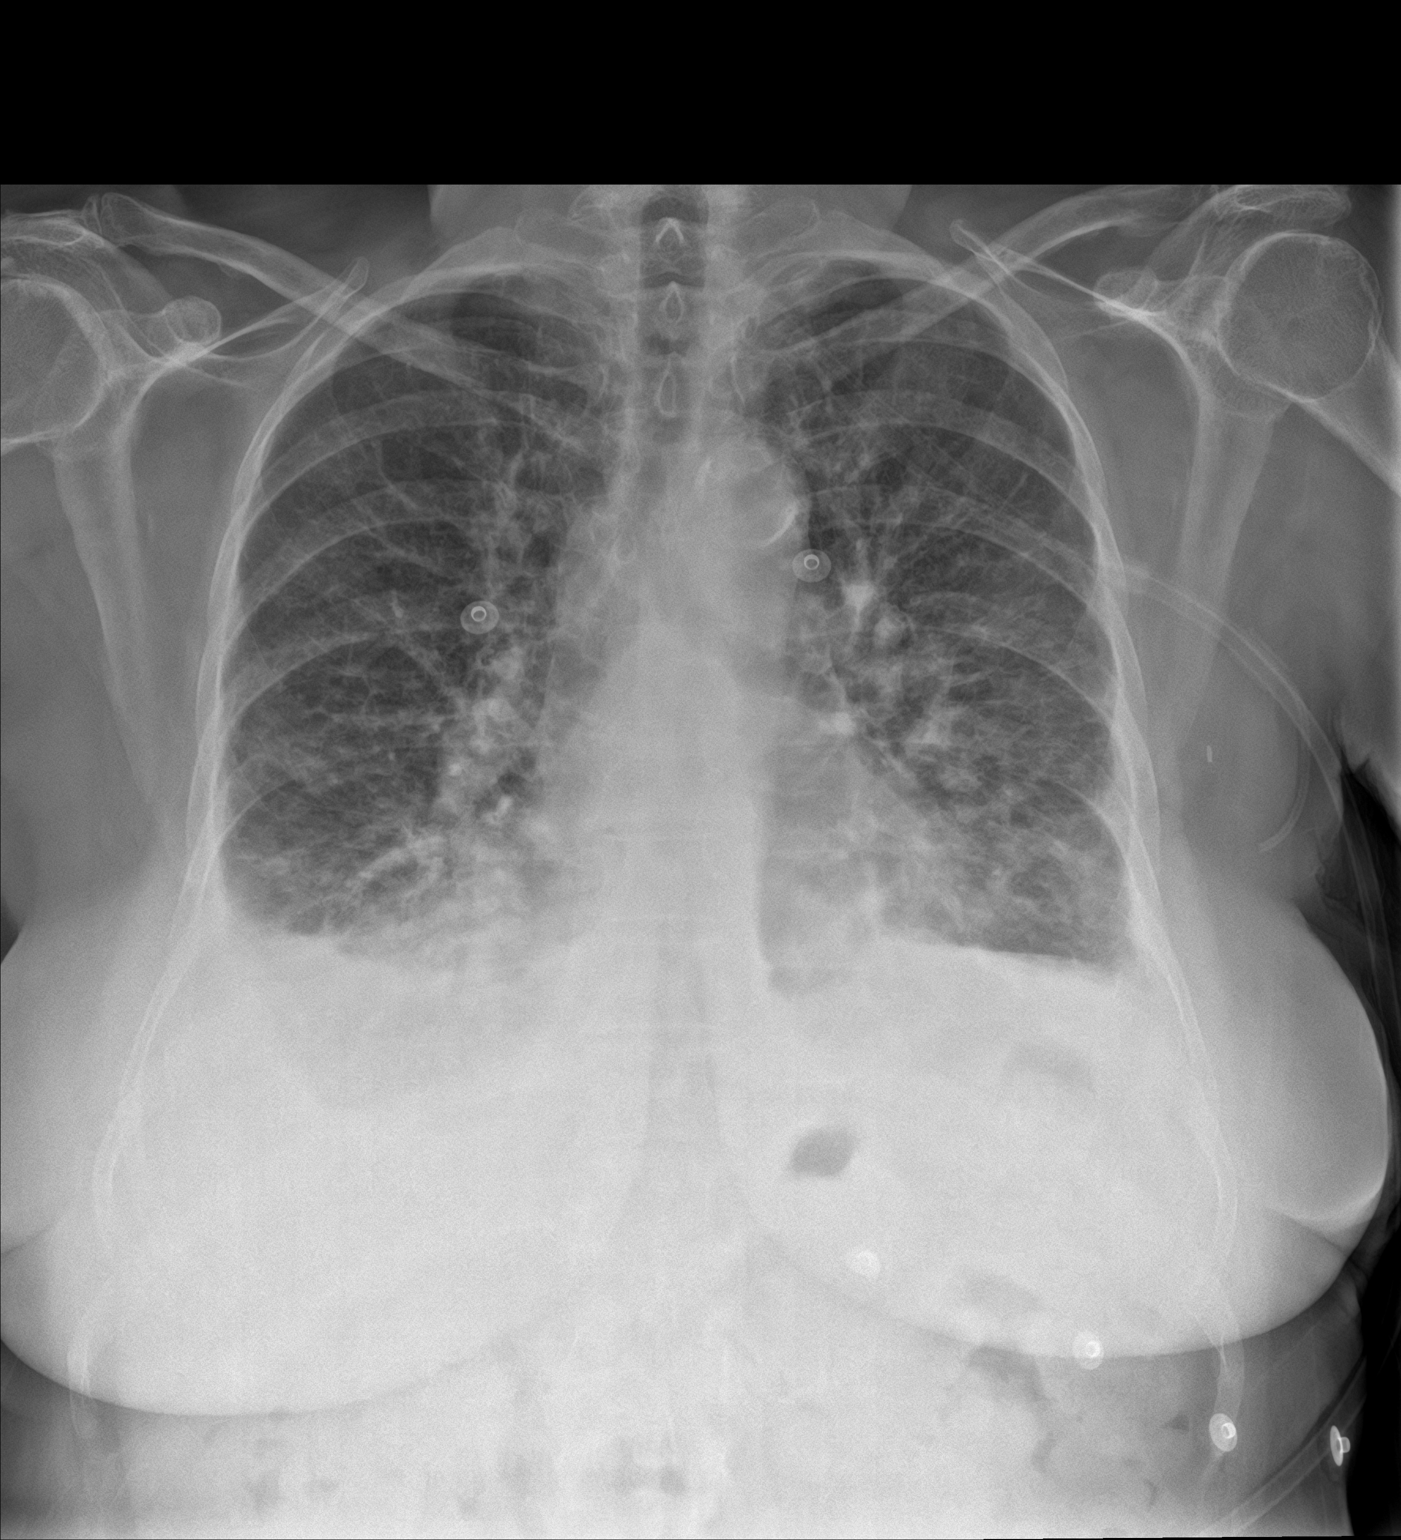

[chest lat]
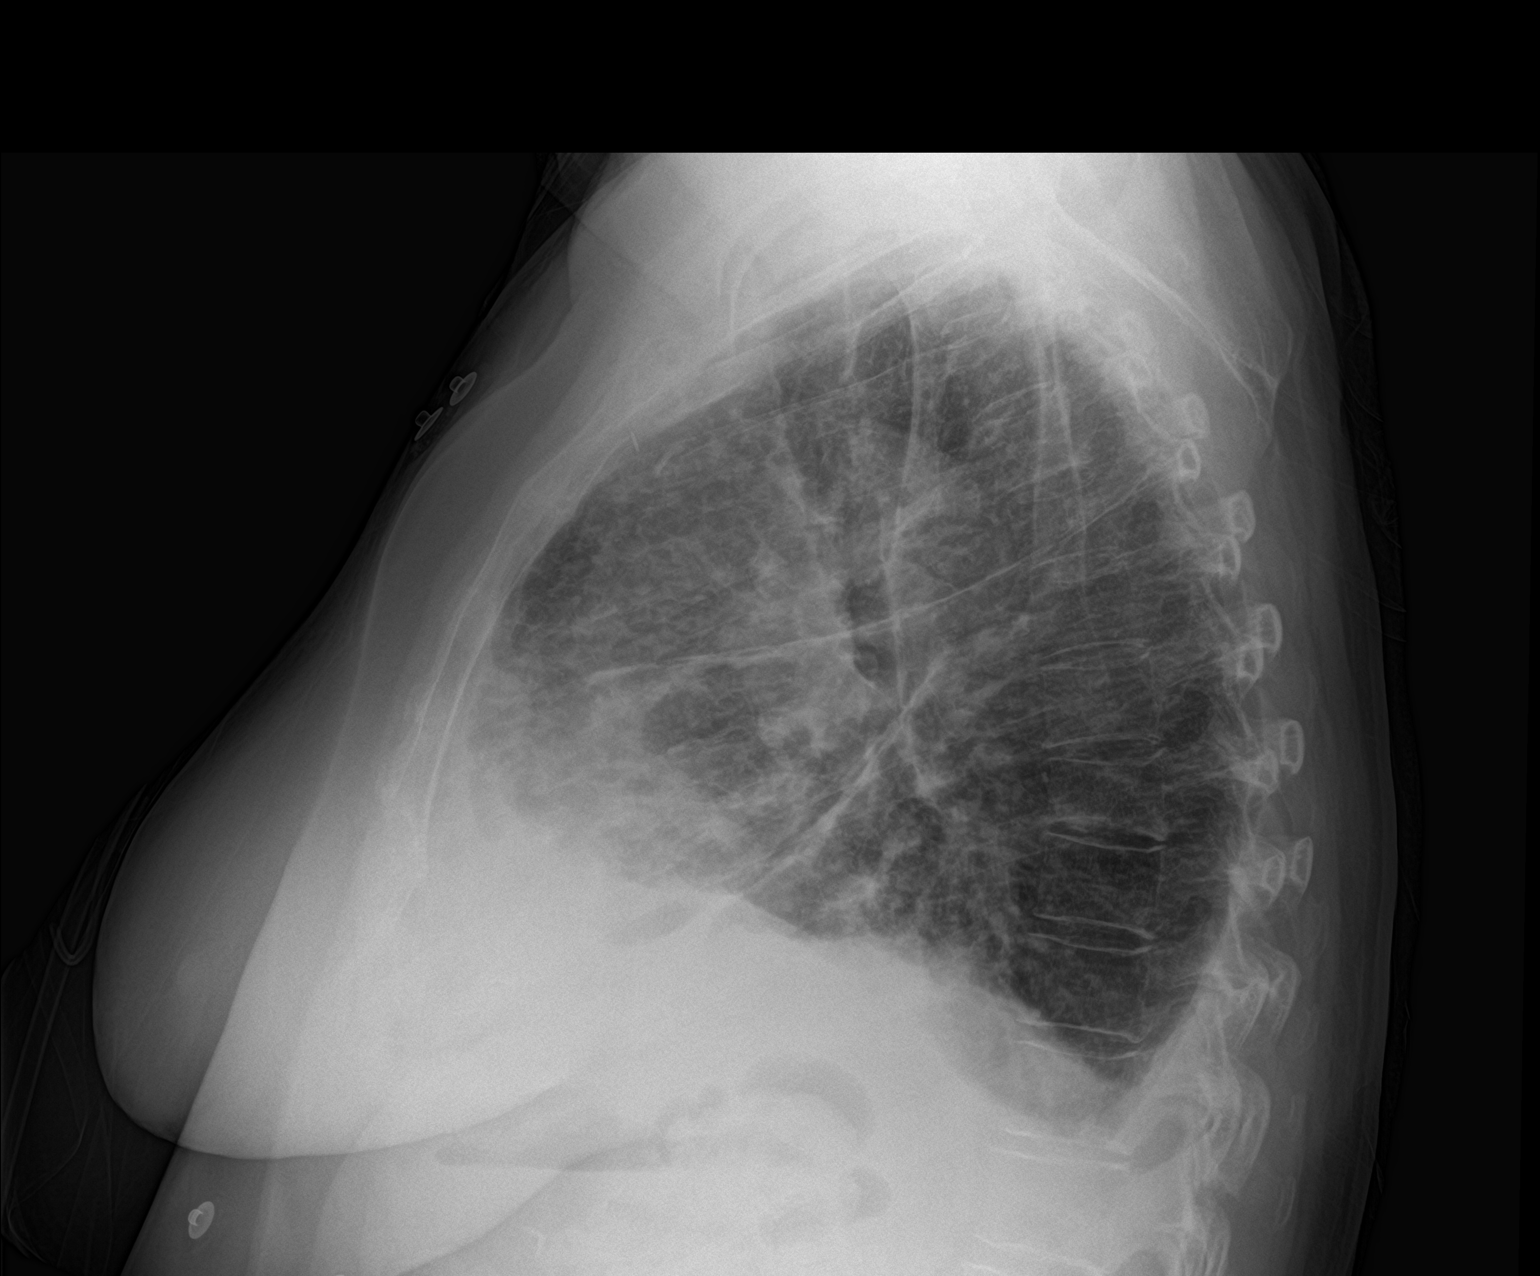

[2 of 2 positions shown; findings below may reference images not displayed]

FINDINGS: Mild to moderate severity diffusely increased interstitial lung
markings are seen. Mild to moderate severity areas of bibasilar
atelectasis and/or early infiltrate are also present. There are
small bilateral pleural effusions. No pneumothorax is identified.
The heart size and mediastinal contours are within normal limits.
There is moderate severity calcification of the aortic arch. The
visualized skeletal structures are unremarkable.
IMPRESSION: 1. Mild to moderate severity interstitial edema with bibasilar
atelectasis and/or early infiltrate.
2. Small bilateral pleural effusions.

## 2021-07-11 MED ORDER — SODIUM CHLORIDE 0.9 % IV SOLN
500.0000 mg | Freq: Once | INTRAVENOUS | Status: AC
  Administered 2021-07-12: 500 mg via INTRAVENOUS
  Filled 2021-07-11: qty 500

## 2021-07-11 MED ORDER — ACETAMINOPHEN 500 MG PO TABS
1000.0000 mg | ORAL_TABLET | Freq: Once | ORAL | Status: AC
  Administered 2021-07-11: 1000 mg via ORAL
  Filled 2021-07-11: qty 2

## 2021-07-11 MED ORDER — SODIUM CHLORIDE 0.9 % IV SOLN
1.0000 g | Freq: Once | INTRAVENOUS | Status: AC
  Administered 2021-07-12: 1 g via INTRAVENOUS
  Filled 2021-07-11: qty 10

## 2021-07-11 MED ORDER — IOHEXOL 350 MG/ML SOLN
75.0000 mL | Freq: Once | INTRAVENOUS | Status: AC | PRN
  Administered 2021-07-11: 75 mL via INTRAVENOUS

## 2021-07-11 NOTE — ED Provider Notes (Signed)
[facility]    CSN: [id number] Arrival date & time: 07/11/21  1944      History   Chief Complaint No chief complaint on file.   HPI Amber Blevins is a 69 y.o. female presenting for increased shortness of breath over the past 2 days.  Patient states she is being worked up for shortness of breath by pulmonology and her oncologist.  Patient has history of breast cancer (~15 years ago) with concern for bone metastasis now.  She has had some shortness of breath on ambulation and exertion previously but says now her shortness of breath is worse and seems to be the worse when she lays down at night.  She says she feels little better when she is just sitting and resting.  Admits some shortness of breath when she talks.  Denies pleuritic pain.  Denies cough or chest pain.  Patient saw pulmonologist at the end of September 2022 and was prescribed Trelegy and albuterol.  She has been using inhalers and does believe that helped some.  Patient reports the pulmonologist believes she may have underlying COPD.  Patient did have a CT with contrast on 07/02/2021 which revealed pulmonary nodules and pleural effusions suspicious for malignancy.  Patient does report runny nose for the past 2 days but denies sore throat, headaches, body aches or weakness.  No other complaints.  HPI  Past Medical History:  Diagnosis Date   Breast cancer ([place])    High cholesterol    Hypertension    Personal history of radiation therapy     Patient Active Problem List   Diagnosis Date Noted   Bone lesion 07/02/2021   Neoplasm related pain 07/02/2021   Lung nodules 07/02/2021   History of breast cancer 07/02/2021    Past Surgical History:  Procedure Laterality Date   BREAST LUMPECTOMY Left    2010    OB History   No obstetric history on file.      Home Medications    Prior to Admission medications   Medication Sig Start Date End Date Taking? Authorizing Provider  albuterol (VENTOLIN HFA) 108  (90 Base) MCG/ACT inhaler Inhale into the lungs. 06/23/21 06/23/22 Yes [provider]  aspirin 81 MG EC tablet Take by mouth. 04/21/21 04/21/22 Yes [provider]  atorvastatin (LIPITOR) 40 MG tablet Take by mouth. 11/25/20  Yes [provider]  Boswellia-Glucosamine-Vit D (OSTEO BI-FLEX-GLUCOS/5-LOXIN) TABS Take 1 capsule by mouth in the morning and at bedtime.   Yes [provider]  Calcium Carb-Cholecalciferol 600-400 MG-UNIT TABS Take by mouth.   Yes [provider]  Docusate Sodium (DSS) 100 MG CAPS Take 1 capsule by mouth in the morning and at bedtime.   Yes [provider]  Fluticasone-Umeclidin-Vilant (TRELEGY ELLIPTA) 100-62.5-25 MCG/INH AEPB Inhale into the lungs. Inhale 1 inhalation into the lungs once daily 06/23/21  Yes [provider]  gabapentin (NEURONTIN) 300 MG capsule Take 2 capsules by mouth in the morning, at noon, and at bedtime. 02/12/21 02/12/22 Yes [provider]  ibuprofen (ADVIL) 200 MG tablet Take by mouth. Take 400 mg by mouth every six (6) hours as needed for pain.   Yes [provider]  letrozole (FEMARA) 2.5 MG tablet Take 1 tablet by mouth daily. Take 1 tablet by mouth once daily 02/12/10  Yes [provider]  metoprolol-hydrochlorothiazide (LOPRESSOR HCT) 50-25 MG tablet Take 1 tablet by mouth daily. Take 1 tablet by mouth once daily 04/21/21 04/21/22 Yes [provider]  traMADol (  ULTRAM) 50 MG tablet Take 1 tablet (50 mg total) by mouth every 6 (six) hours as needed. 07/02/21  Yes [name], MD    Family History Family History  Problem Relation Age of Onset   Cancer Mother        gynecological   Lung cancer Mother    Diabetes Father    Heart disease Father    Parkinson's disease Father    Brain cancer Brother    Bladder Cancer Brother    Pulmonary disease Brother    Rheumatic fever Brother    Breast cancer Neg Hx     Social History Social History   Tobacco Use    Smoking status: Former    Packs/day: 1.00    Years: 25.00    Pack years: 25.00    Types: Cigarettes    Quit date: 1990    Years since quitting: 32.8   Smokeless tobacco: Never  Vaping Use   Vaping Use: Never used  Substance Use Topics   Alcohol use: Yes    Comment: occasionally   Drug use: Never     Allergies   Green tea (camellia sinensis), Melaleuca viridiflora, and Lisinopril   Review of Systems Review of Systems  Constitutional:  Negative for chills, diaphoresis, fatigue and fever.  HENT:  Positive for rhinorrhea. Negative for congestion, ear pain, sinus pressure, sinus pain and sore throat.   Respiratory:  Positive for shortness of breath. Negative for cough.   Gastrointestinal:  Negative for abdominal pain, nausea and vomiting.  Musculoskeletal:  Negative for arthralgias and myalgias.  Skin:  Negative for rash.  Neurological:  Negative for weakness and headaches.  Hematological:  Negative for adenopathy.    Physical Exam Triage Vital Signs ED Triage Vitals  Enc Vitals Group     BP 07/11/21 1957 (!) 160/79     Pulse Rate 07/11/21 1957 94     Resp 07/11/21 1957 (!) 22     Temp 07/11/21 1957 98.5 F (36.9 C)     Temp Source 07/11/21 1957 Oral     SpO2 07/11/21 1957 94 %     Weight 07/11/21 1958 180 lb (81.6 kg)     Height 07/11/21 1958 5\' 5"  (1.651 m)     Head Circumference --      Peak Flow --      Pain Score 07/11/21 1958 0     Pain Loc --      Pain Edu? --      Excl. in [place]? --    No data found.  Updated Vital Signs BP (!) 160/79   Pulse 94   Temp 98.5 F (36.9 C) (Oral)   Resp (!) 22   Ht 5\' 5"  (1.651 m)   Wt 180 lb (81.6 kg)   SpO2 94%   BMI 29.95 kg/m      Physical Exam Vitals and nursing note reviewed.  Constitutional:      General: She is not in acute distress.    Appearance: Normal appearance. She is not ill-appearing or toxic-appearing.  HENT:     Head: Normocephalic and atraumatic.     Nose: Nose normal.     Mouth/Throat:      Mouth: Mucous membranes are moist.     Pharynx: Oropharynx is clear.  Eyes:     General: No scleral icterus.       Right eye: No discharge.        Left eye: No discharge.     Conjunctiva/sclera: Conjunctivae normal.  Cardiovascular:     Rate and Rhythm: Normal rate and regular rhythm.     Heart sounds: Normal heart sounds.  Pulmonary:     Effort: Tachypnea present. No respiratory distress.     Breath sounds: Normal breath sounds. Decreased air movement (decreased throughout) present.     Comments: Mildly labored breathing when speaking, has to pause to catch breath at times Musculoskeletal:     Cervical back: Neck supple.  Skin:    General: Skin is dry.  Neurological:     General: No focal deficit present.     Mental Status: She is alert. Mental status is at baseline.     Motor: No weakness.     Gait: Gait normal.  Psychiatric:        Mood and Affect: Mood normal.        Behavior: Behavior normal.        Thought Content: Thought content normal.     UC Treatments / Results  Labs (all labs ordered are listed, but only abnormal results are displayed) Labs Reviewed  SARS CORONAVIRUS 2 (TAT 6-24 HRS)  POC SARS CORONAVIRUS 2 AG    EKG   Radiology No results found.  Procedures Procedures (including critical care time)  Medications Ordered in UC Medications - No data to display  Initial Impression / Assessment and Plan / UC Course  I have reviewed the triage vital signs and the nursing notes.  Pertinent labs & imaging results that were available during my care of the patient were reviewed by me and considered in my medical decision making (see chart for details).  70 year old female presenting for increased shortness of breath over the past 2 days and runny nose.  Patient does have history of pulmonary nodules, pleural effusions, suspect severe COPD and suspected mets to lungs and bone from breast cancer.  Has been using Trelegy and albuterol without much improvement  in symptoms in the past couple of days.  Patient's resting oxygen saturation ranges between 91 to 93%.  On ambulation it does drop to 88% and she becomes increasingly short of breath.  Additionally she does have to pause to take a breath when speaking to me during history and physical.  Mildly decreased breath sounds throughout.  Increased respiratory rate.  Rapid COVID test negative.  PCR test sent.  Significant concern for PE given her history of cancer with mets and increasingly hoarse shortness of breath.  Other possibilities could be pneumonia, enlarging pleural effusions/pulmonary edema, COPD exacerbation, COVID-19 infection, MI.  Advised patient she needs further work-up in the emergency department this time for hypoxia.  Advised her she needs a CT with contrast to rule out PE.  She did have a CT 9 days ago but her increasingly worsening shortness of breath just developed over the past 2 days.  Patient agrees and EMS called.  Report given to EMS.  Patient leaving in stable condition.   Final Clinical Impressions(s) / UC Diagnoses   Final diagnoses:  Respiratory distress  Hypoxia  Bone metastasis ([place])     Discharge Instructions      -Your resting oxygen ranges between 91 to 94%.  On ambulation your oxygen does drop to 88%.  I have concerns about possible pulmonary embolism given your history of cancer and worsening of your shortness of breath over the past couple of days. -Negative rapid COVID test in clinic. -I have advised further work-up in the emergency department at this time.     ED Prescriptions  None    PDMP not reviewed this encounter.   Danton Clap, PA-C 07/12/21 [phone]

## 2021-07-11 NOTE — ED Notes (Signed)
Patient is being discharged from the Urgent Care and sent to the Emergency Department via EMS . Per EAVES, PA, patient is in need of higher level of care due to shortness of breath / COPD. Patient is aware and verbalizes understanding of plan of care.  Vitals:   07/11/21 1957  BP: (!) 160/79  Pulse: 94  Resp: (!) 22  Temp: 98.5 F (36.9 C)  SpO2: 94%

## 2021-07-11 NOTE — ED Triage Notes (Addendum)
Pt in via [facility] from [facility].  Pt reports worsening SOB over the last few days.  Reports being unable to sleep the last 2 nights, attempting to sleep sitting up in chair but still awake every hour due to SOB.  Pt with recent lumbar cancer dx, recent CT chest but has not had oncology follow up to receive results.    On arrival, patient on 2L nasal cannula; states O2 88% with ambulation at Urgent Care.  Denies chest pain, NAD noted at this time.

## 2021-07-11 NOTE — Discharge Instructions (Addendum)
-  Your resting oxygen ranges between 91 to 94%.  On ambulation your oxygen does drop to 88%.  I have concerns about possible pulmonary embolism given your history of cancer and worsening of your shortness of breath over the past couple of days. -Negative rapid COVID test in clinic. -I have advised further work-up in the emergency department at this time.

## 2021-07-11 NOTE — ED Notes (Signed)
Patient transported to X-ray 

## 2021-07-11 NOTE — Telephone Encounter (Signed)
Called patient, Amber Blevins and spoke with her regarding scheduled procedure core iliac bone biopsy on Wednesday 07/16/2021 and to arrive at [facility] at 0900, NPO past midnight Tuesday night, take morning meds with small sip water, have responsible driver and she states will be her husband, No valuables, Comfortable clothing and patient verbalized again the arrival time of 0900, and verbalized understanding of instructions.  Patient questions regarding moderate sedation and local site numbing were answered.

## 2021-07-11 NOTE — ED Triage Notes (Signed)
Pt here with C/O SOB for 2 days, night time is worst. Has HX of COPD, felt worst.

## 2021-07-11 NOTE — ED Triage Notes (Signed)
Pt came from urgent care with complaints of SOB for two days she has hx of COPD. She is wanting to some home O2. Per EMS VS she is on 2L [place]

## 2021-07-11 NOTE — ED Provider Notes (Signed)
[facility] Emergency Department Provider Note   ____________________________________________   Event Date/Time   First MD Initiated Contact with Patient 07/11/21 2309     (approximate)  I have reviewed the triage vital signs and the nursing notes.   HISTORY  Chief Complaint Shortness of Breath    HPI Amber Blevins is a 70 y.o. female referred to the ED from UC with a chief complaint of SOB with hypoxia. Patient with a history of metastatic breast cancer, currently being worked up for sob by pulmonology and her oncologist. Amber Blevins DOE and on supine position. Denies fever, cough, chest pain, abdominal pain, nausea, vomiting or dizziness. Pulmonology suspects patient may have underlying COPD. Runny nose x 2 days without sore throat, headaches, body aches or weakness.      Past Medical History:  Diagnosis Date  . Breast cancer ([place])   . High cholesterol   . Hypertension   . Personal history of radiation therapy     Patient Active Problem List   Diagnosis Date Noted  . Bone lesion 07/02/2021  . Neoplasm related pain 07/02/2021  . Lung nodules 07/02/2021  . History of breast cancer 07/02/2021    Past Surgical History:  Procedure Laterality Date  . BREAST LUMPECTOMY Left    2010    Prior to Admission medications   Medication Sig Start Date End Date Taking? Authorizing Provider  albuterol (VENTOLIN HFA) 108 (90 Base) MCG/ACT inhaler Inhale into the lungs. 06/23/21 06/23/22  [provider]  aspirin 81 MG EC tablet Take by mouth. 04/21/21 04/21/22  [provider]  atorvastatin (LIPITOR) 40 MG tablet Take by mouth. 11/25/20   [provider]  Boswellia-Glucosamine-Vit D (OSTEO BI-FLEX-GLUCOS/5-LOXIN) TABS Take 1 capsule by mouth in the morning and at bedtime.    [provider]  Calcium Carb-Cholecalciferol 600-400 MG-UNIT TABS Take by mouth.    [provider]  Docusate Sodium (DSS) 100 MG CAPS Take  1 capsule by mouth in the morning and at bedtime.    [provider]  Fluticasone-Umeclidin-Vilant (TRELEGY ELLIPTA) 100-62.5-25 MCG/INH AEPB Inhale into the lungs. Inhale 1 inhalation into the lungs once daily 06/23/21   [provider]  gabapentin (NEURONTIN) 300 MG capsule Take 2 capsules by mouth in the morning, at noon, and at bedtime. 02/12/21 02/12/22  [provider]  hydrochlorothiazide (HYDRODIURIL) 25 MG tablet Take 25 mg by mouth daily. 04/13/21   [provider]  ibuprofen (ADVIL) 200 MG tablet Take by mouth. Take 400 mg by mouth every six (6) hours as needed for pain.    [provider]  letrozole (FEMARA) 2.5 MG tablet Take 1 tablet by mouth daily. Take 1 tablet by mouth once daily 02/12/10   [provider]  metoprolol-hydrochlorothiazide (LOPRESSOR HCT) 50-25 MG tablet Take 1 tablet by mouth daily. Take 1 tablet by mouth once daily 04/21/21 04/21/22  [provider]  traMADol (ULTRAM) 50 MG tablet Take 1 tablet (50 mg total) by mouth every 6 (six) hours as needed. 07/02/21   [name], MD    Allergies Green tea (camellia sinensis), Melaleuca viridiflora, and Lisinopril  Family History  Problem Relation Age of Onset  . Cancer Mother        gynecological  . Lung cancer Mother   . Diabetes Father   . Heart disease Father   . Parkinson's disease Father   . Brain cancer Brother   . Bladder Cancer Brother   . Pulmonary disease Brother   .  Rheumatic fever Brother   . Breast cancer Neg Hx     Social History Social History   Tobacco Use  . Smoking status: Former    Packs/day: 1.00    Years: 25.00    Pack years: 25.00    Types: Cigarettes    Quit date: 1990    Years since quitting: 32.8  . Smokeless tobacco: Never  Vaping Use  . Vaping Use: Never used  Substance Use Topics  . Alcohol use: Yes  . Drug use: Never    Review of Systems  Constitutional: No fever/chills Eyes: No visual changes. ENT: Positive for  rhinorrhea/congestion. No sore throat. Cardiovascular: Denies chest pain. Respiratory: Positive for shortness of breath. Gastrointestinal: No abdominal pain.  No nausea, no vomiting.  No diarrhea.  No constipation. Genitourinary: Negative for dysuria. Musculoskeletal: Negative for back pain. Skin: Negative for rash. Neurological: Negative for headaches, focal weakness or numbness.   ____________________________________________   PHYSICAL EXAM:  VITAL SIGNS: ED Triage Vitals  Enc Vitals Group     BP --      Pulse --      Resp --      Temp --      Temp src --      SpO2 07/11/21 2220 94 %     Weight 07/11/21 2222 180 lb (81.6 kg)     Height 07/11/21 2222 5\' 5"  (1.651 m)     Head Circumference --      Peak Flow --      Pain Score 07/11/21 2222 6     Pain Loc --      Pain Edu? --      Excl. in [place]? --     Constitutional: Alert and oriented. Elderly appearing and in mild acute distress. Eyes: Conjunctivae are normal. PERRL. EOMI. Head: Atraumatic. Nose: No congestion/rhinnorhea. Mouth/Throat: Mucous membranes are moist.   Neck: No stridor.   Cardiovascular: Normal rate, regular rhythm. Grossly normal heart sounds.  Good peripheral circulation. Respiratory: Increased respiratory effort.  No retractions. Lungs with faint rales. Gastrointestinal: Soft and nontender. No distention. No abdominal bruits. No CVA tenderness. Musculoskeletal: No lower extremity tenderness nor edema.  No joint effusions. Neurologic:  Normal speech and language. No gross focal neurologic deficits are appreciated. Skin:  Skin is warm, dry and intact. No rash noted. Psychiatric: Mood and affect are normal. Speech and behavior are normal.  ____________________________________________   LABS (all labs ordered are listed, but only abnormal results are displayed)  Labs Reviewed  CBC - Abnormal; Notable for the following components:      Result Value   WBC 10.9 (*)    All other components within  normal limits  BASIC METABOLIC PANEL - Abnormal; Notable for the following components:   Glucose, Bld 110 (*)    All other components within normal limits  CULTURE, BLOOD (ROUTINE X 2)  CULTURE, BLOOD (ROUTINE X 2)  RESP PANEL BY RT-PCR (FLU A&B, COVID) ARPGX2  BRAIN NATRIURETIC PEPTIDE  LACTIC ACID, PLASMA  LACTIC ACID, PLASMA  TROPONIN I (HIGH SENSITIVITY)   ____________________________________________  EKG  ED ECG REPORT I, Kamy Poinsett J, the attending physician, personally viewed and interpreted this ECG.   Date: 07/11/2021  EKG Time: 2226  Rate: 92  Rhythm: normal sinus rhythm  Axis: Normal  Intervals:none  ST&T Change: Nonspecific  ____________________________________________  RADIOLOGY I, Aricela Bertagnolli J, personally viewed and evaluated these images (plain radiographs) as part of my medical decision making, as well as reviewing the written report by  the radiologist.  ED MD interpretation:  Mild to moderate edema possible early infiltrate; no PE, pleural effusions  Official radiology report(s): DG Chest 2 View  Result Date: 07/11/2021 CLINICAL DATA:  Worsening shortness of breath. EXAM: CHEST - 2 VIEW COMPARISON:  None. FINDINGS: Mild to moderate severity diffusely increased interstitial lung markings are seen. Mild to moderate severity areas of bibasilar atelectasis and/or early infiltrate are also present. There are small bilateral pleural effusions. No pneumothorax is identified. The heart size and mediastinal contours are within normal limits. There is moderate severity calcification of the aortic arch. The visualized skeletal structures are unremarkable. IMPRESSION: 1. Mild to moderate severity interstitial edema with bibasilar atelectasis and/or early infiltrate. 2. Small bilateral pleural effusions. Electronically Signed   By: Virgina Norfolk M.D.   On: 07/11/2021 23:03   CT Angio Chest PE W/Cm &/Or Wo Cm  Result Date: 07/12/2021 CLINICAL DATA:  Shortness of breath  EXAM: CT ANGIOGRAPHY CHEST WITH CONTRAST TECHNIQUE: Multidetector CT imaging of the chest was performed using the standard protocol during bolus administration of intravenous contrast. Multiplanar CT image reconstructions and MIPs were obtained to evaluate the vascular anatomy. CONTRAST:  80mL OMNIPAQUE IOHEXOL 350 MG/ML SOLN COMPARISON:  None. FINDINGS: Cardiovascular: Contrast injection is sufficient to demonstrate satisfactory opacification of the pulmonary arteries to the segmental level. There is no pulmonary embolus or evidence of right heart strain. The size of the main pulmonary artery is normal. Heart size is normal, with no pericardial effusion. The course and caliber of the aorta are normal. There is atherosclerotic calcification. No acute aortic syndrome. Mediastinum/Nodes: There is mediastinal and hilar lymphadenopathy that is unchanged. Right hilar nodes cause marked narrowing of right middle lobe arterial branches. No axillary adenopathy is visible. Soft tissue focus in the outer left breast is unchanged. Lungs/Pleura: Large pleural effusions. Airways are patent. There is mild-to-moderate interstitial pulmonary edema. Upper Abdomen: Contrast bolus timing is not optimized for evaluation of the abdominal organs. Incompletely visualized left adrenal nodule measures at least 11 mm. Musculoskeletal: No chest wall abnormality. No bony spinal canal stenosis. Review of the MIP images confirms the above findings. IMPRESSION: 1. No pulmonary embolus or acute aortic syndrome. 2. Large pleural effusions and mild-to-moderate interstitial pulmonary edema. 3. Bulky mediastinal and hilar lymphadenopathy, unchanged. 4. Incompletely visualized left adrenal nodule. Electronically Signed   By: Ulyses Jarred M.D.   On: 07/12/2021 00:28    ____________________________________________   PROCEDURES  Procedure(s) performed (including Critical Care):  .1-3 Lead EKG Interpretation Performed by: Paulette Blanch,  MD Authorized by: Paulette Blanch, MD     Interpretation: normal     ECG rate:  90   ECG rate assessment: normal     Rhythm: sinus rhythm     Ectopy: none     Conduction: normal   Comments:     Patient placed on cardiac monitor to evaluate for arrthymias   ____________________________________________   INITIAL IMPRESSION / ASSESSMENT AND PLAN / ED COURSE  As part of my medical decision making, I reviewed the following data within the electronic MEDICAL RECORD NUMBER History obtained from family, Nursing notes reviewed and incorporated, Labs reviewed, EKG interpreted, Old chart reviewed, Radiograph reviewed, Discussed with admitting physician, and Notes from prior ED visits     70 year old female with metastatic breast ca presenting for sob with hypoxia. Differential includes, but is not limited to, viral syndrome, bronchitis including COPD exacerbation, pneumonia, reactive airway disease including asthma, CHF including exacerbation with or without pulmonary/interstitial  edema, pneumothorax, ACS, thoracic trauma, and pulmonary embolism.   RA saturations 88%, placed on 2LNC with improvement to 94%. Laboratory results unremarkable; edema and suggestion of CAP on cxr. Patient is high-risk for PE; will obtain CTA chest. Will discuss with hospitalist services for admission.  Clinical Course as of 07/12/21 0153  Sat Jul 12, 2021  2023 Pleural effusions noted on CTA chest.  Will discuss with hospitalist services for admission. [JS]    Clinical Course User Index [JS] Paulette Blanch, MD     ____________________________________________   FINAL CLINICAL IMPRESSION(S) / ED DIAGNOSES  Final diagnoses:  Respiratory distress  Hypoxia  Community acquired pneumonia, unspecified laterality  Pleural effusion     ED Discharge Orders     None        Note:  This document was prepared using Dragon voice recognition software and may include unintentional dictation errors.    Paulette Blanch,  MD 07/12/21 [phone]

## 2021-07-12 ENCOUNTER — Inpatient Hospital Stay: Payer: Medicare PPO

## 2021-07-12 ENCOUNTER — Ambulatory Visit
Admission: RE | Admit: 2021-07-12 | Discharge: 2021-07-12 | Disposition: A | Discharge status: Home / Self Care | Payer: Medicare PPO | Source: Ambulatory Visit | Attending: Neurosurgery | Admitting: Neurosurgery

## 2021-07-12 DIAGNOSIS — C801 Malignant (primary) neoplasm, unspecified: Secondary | ICD-10-CM | POA: Diagnosis not present

## 2021-07-12 DIAGNOSIS — Z8249 Family history of ischemic heart disease and other diseases of the circulatory system: Secondary | ICD-10-CM | POA: Diagnosis not present

## 2021-07-12 DIAGNOSIS — Z8052 Family history of malignant neoplasm of bladder: Secondary | ICD-10-CM | POA: Diagnosis not present

## 2021-07-12 DIAGNOSIS — J9600 Acute respiratory failure, unspecified whether with hypoxia or hypercapnia: Secondary | ICD-10-CM | POA: Diagnosis present

## 2021-07-12 DIAGNOSIS — I700 Atherosclerosis of aorta: Secondary | ICD-10-CM

## 2021-07-12 DIAGNOSIS — C7971 Secondary malignant neoplasm of right adrenal gland: Secondary | ICD-10-CM | POA: Diagnosis present

## 2021-07-12 DIAGNOSIS — Z7982 Long term (current) use of aspirin: Secondary | ICD-10-CM | POA: Diagnosis not present

## 2021-07-12 DIAGNOSIS — Z87891 Personal history of nicotine dependence: Secondary | ICD-10-CM | POA: Diagnosis not present

## 2021-07-12 DIAGNOSIS — R918 Other nonspecific abnormal finding of lung field: Secondary | ICD-10-CM | POA: Diagnosis present

## 2021-07-12 DIAGNOSIS — C7951 Secondary malignant neoplasm of bone: Principal | ICD-10-CM

## 2021-07-12 DIAGNOSIS — J90 Pleural effusion, not elsewhere classified: Secondary | ICD-10-CM | POA: Diagnosis not present

## 2021-07-12 DIAGNOSIS — J9601 Acute respiratory failure with hypoxia: Secondary | ICD-10-CM | POA: Diagnosis present

## 2021-07-12 DIAGNOSIS — J910 Malignant pleural effusion: Secondary | ICD-10-CM | POA: Diagnosis present

## 2021-07-12 DIAGNOSIS — R0602 Shortness of breath: Secondary | ICD-10-CM | POA: Diagnosis not present

## 2021-07-12 DIAGNOSIS — Z853 Personal history of malignant neoplasm of breast: Secondary | ICD-10-CM | POA: Diagnosis not present

## 2021-07-12 DIAGNOSIS — Z888 Allergy status to other drugs, medicaments and biological substances status: Secondary | ICD-10-CM | POA: Diagnosis not present

## 2021-07-12 DIAGNOSIS — C7972 Secondary malignant neoplasm of left adrenal gland: Secondary | ICD-10-CM | POA: Diagnosis present

## 2021-07-12 DIAGNOSIS — Z79811 Long term (current) use of aromatase inhibitors: Secondary | ICD-10-CM | POA: Diagnosis not present

## 2021-07-12 DIAGNOSIS — Z833 Family history of diabetes mellitus: Secondary | ICD-10-CM | POA: Diagnosis not present

## 2021-07-12 DIAGNOSIS — Z801 Family history of malignant neoplasm of trachea, bronchus and lung: Secondary | ICD-10-CM | POA: Diagnosis not present

## 2021-07-12 DIAGNOSIS — Z20822 Contact with and (suspected) exposure to covid-19: Secondary | ICD-10-CM | POA: Diagnosis present

## 2021-07-12 DIAGNOSIS — Z79899 Other long term (current) drug therapy: Secondary | ICD-10-CM | POA: Diagnosis not present

## 2021-07-12 DIAGNOSIS — Z7951 Long term (current) use of inhaled steroids: Secondary | ICD-10-CM | POA: Diagnosis not present

## 2021-07-12 DIAGNOSIS — Z820 Family history of epilepsy and other diseases of the nervous system: Secondary | ICD-10-CM | POA: Diagnosis not present

## 2021-07-12 DIAGNOSIS — R590 Localized enlarged lymph nodes: Secondary | ICD-10-CM | POA: Insufficient documentation

## 2021-07-12 DIAGNOSIS — E7800 Pure hypercholesterolemia, unspecified: Secondary | ICD-10-CM | POA: Diagnosis present

## 2021-07-12 DIAGNOSIS — Z808 Family history of malignant neoplasm of other organs or systems: Secondary | ICD-10-CM | POA: Diagnosis not present

## 2021-07-12 DIAGNOSIS — I10 Essential (primary) hypertension: Secondary | ICD-10-CM | POA: Diagnosis present

## 2021-07-12 DIAGNOSIS — Z923 Personal history of irradiation: Secondary | ICD-10-CM | POA: Diagnosis not present

## 2021-07-12 LAB — GLUCOSE, PLEURAL OR PERITONEAL FLUID: Glucose, Fluid: 95 mg/dL

## 2021-07-12 LAB — BODY FLUID CELL COUNT WITH DIFFERENTIAL
Eos, Fluid: 0 %
Lymphs, Fluid: 90 %
Monocyte-Macrophage-Serous Fluid: 5 %
Neutrophil Count, Fluid: 5 %
Total Nucleated Cell Count, Fluid: 906 cu mm

## 2021-07-12 LAB — RESP PANEL BY RT-PCR (FLU A&B, COVID) ARPGX2
Influenza A by PCR: NEGATIVE
Influenza B by PCR: NEGATIVE
SARS Coronavirus 2 by RT PCR: NEGATIVE

## 2021-07-12 LAB — PROCALCITONIN: Procalcitonin: <0.1 ng/mL

## 2021-07-12 LAB — PROTEIN, PLEURAL OR PERITONEAL FLUID: Total protein, fluid: 3.5 g/dL

## 2021-07-12 LAB — LACTIC ACID, PLASMA
Lactic Acid, Venous: 1.3 mmol/L (ref 0.5–1.9)
Lactic Acid, Venous: 1.3 mmol/L (ref 0.5–1.9)

## 2021-07-12 LAB — LACTATE DEHYDROGENASE, PLEURAL OR PERITONEAL FLUID: LD, Fluid: 110 U/L — ABNORMAL HIGH (ref 3–23)

## 2021-07-12 LAB — AMYLASE, PLEURAL OR PERITONEAL FLUID: Amylase, Fluid: 26 U/L

## 2021-07-12 IMAGING — US US THORACENTESIS ASP PLEURAL SPACE W/IMG GUIDE
1 series · 7 of 7 positions shown · non-contrast
Comparison: none

INDICATION: Patient with history of metastatic breast cancer, dyspnea, bilateral
pleural effusions; request received for diagnostic and therapeutic
right thoracentesis.

[Series 1: us thoracentesis asp pleural space w/img guide · 0.22mm/px · 7 of 7 slices shown]
[im 1/7]
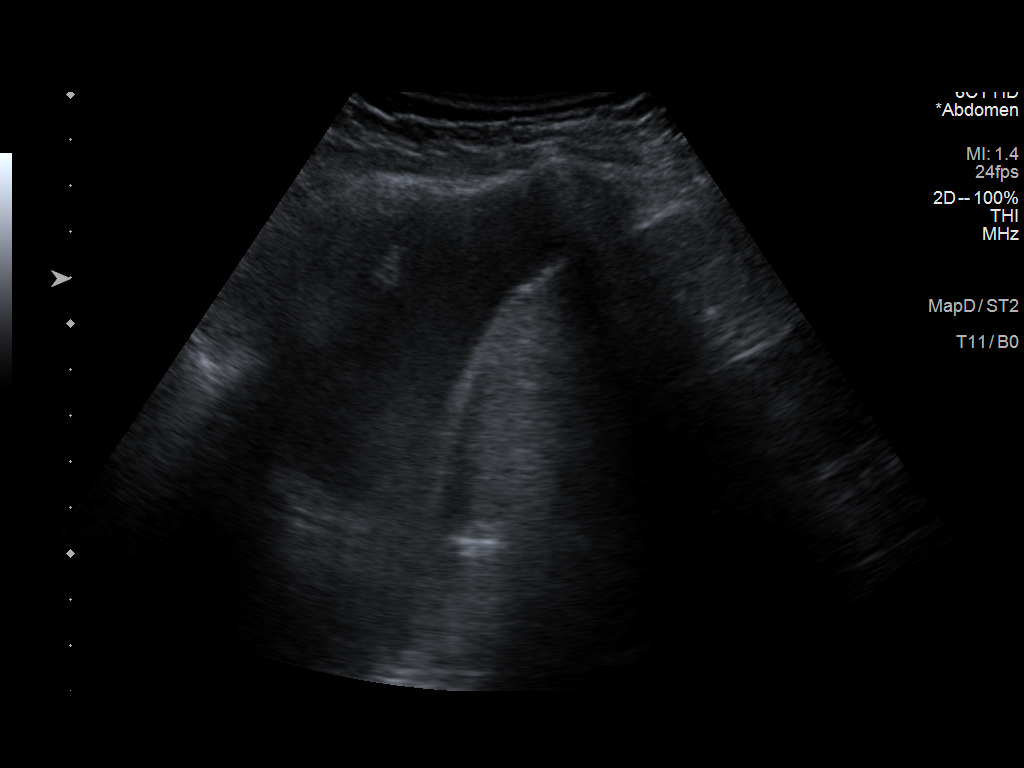
[im 2/7]
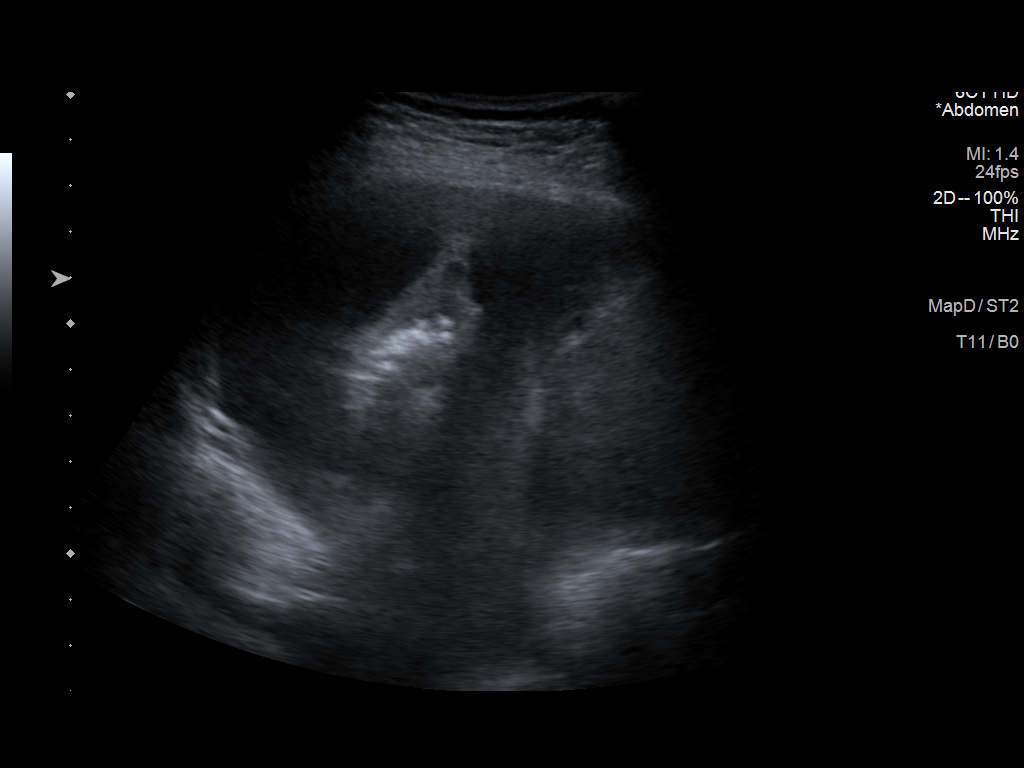
[im 3/7]
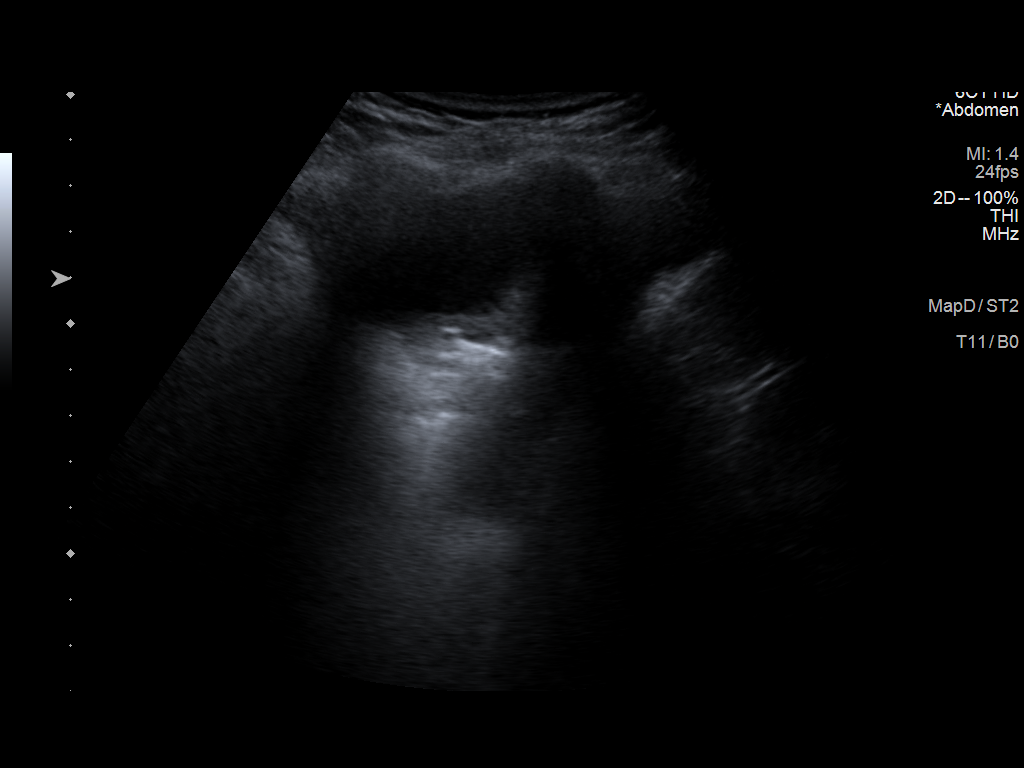
[im 4/7]
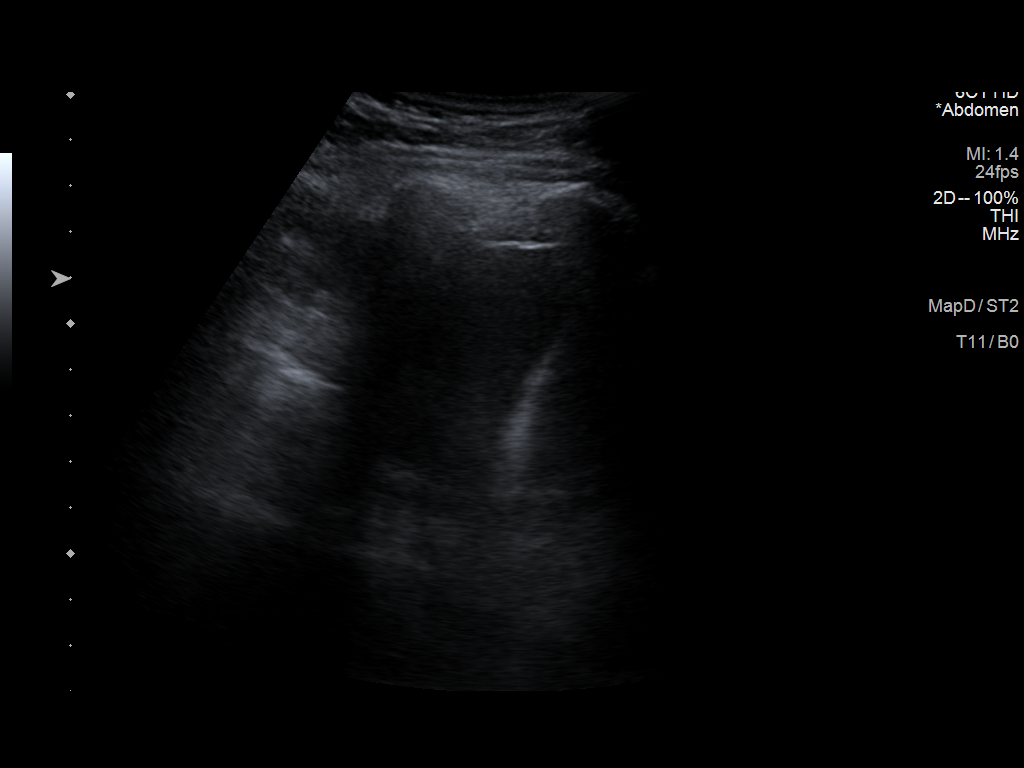
[im 5/7]
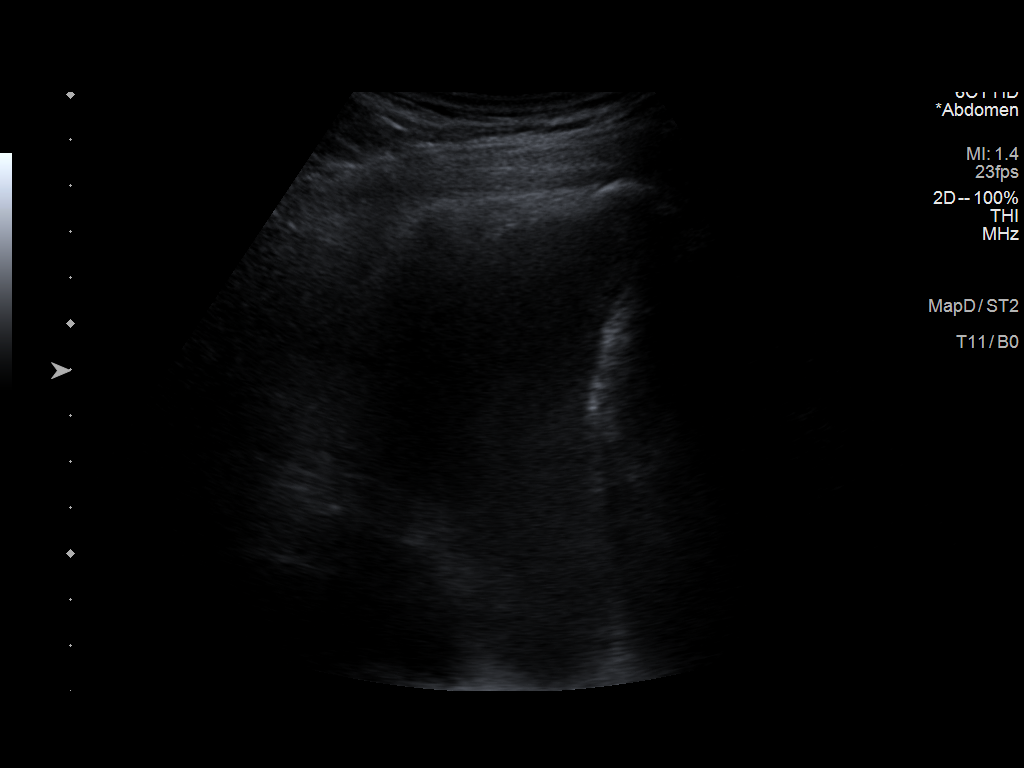
[im 6/7]
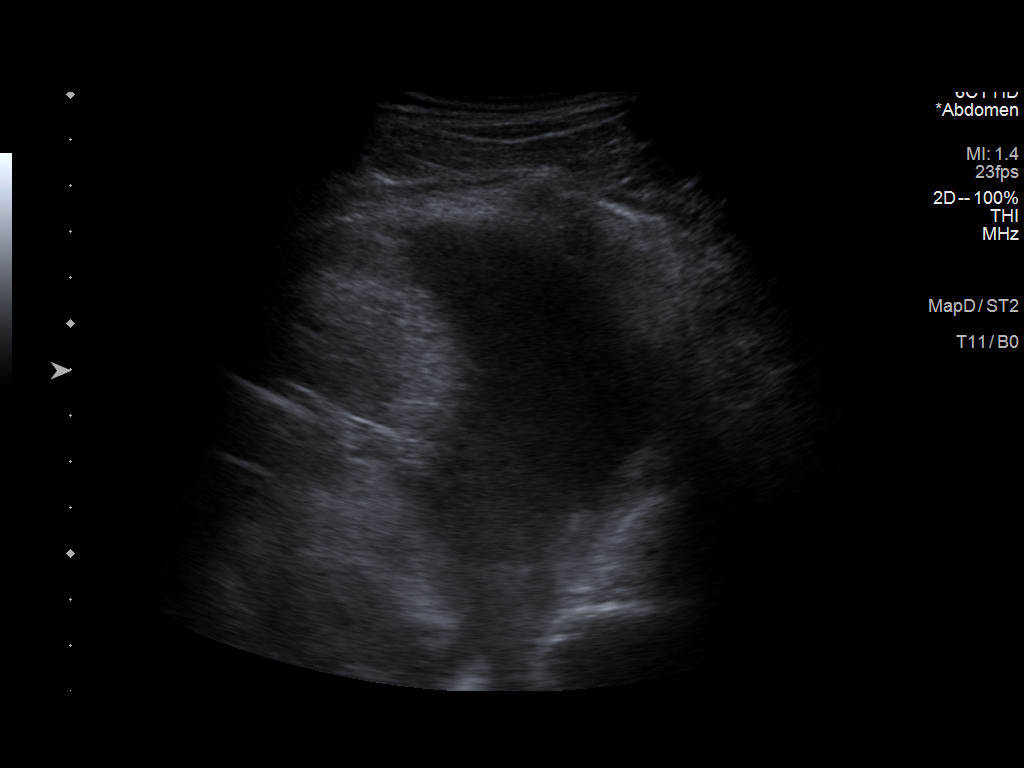
[im 7/7]
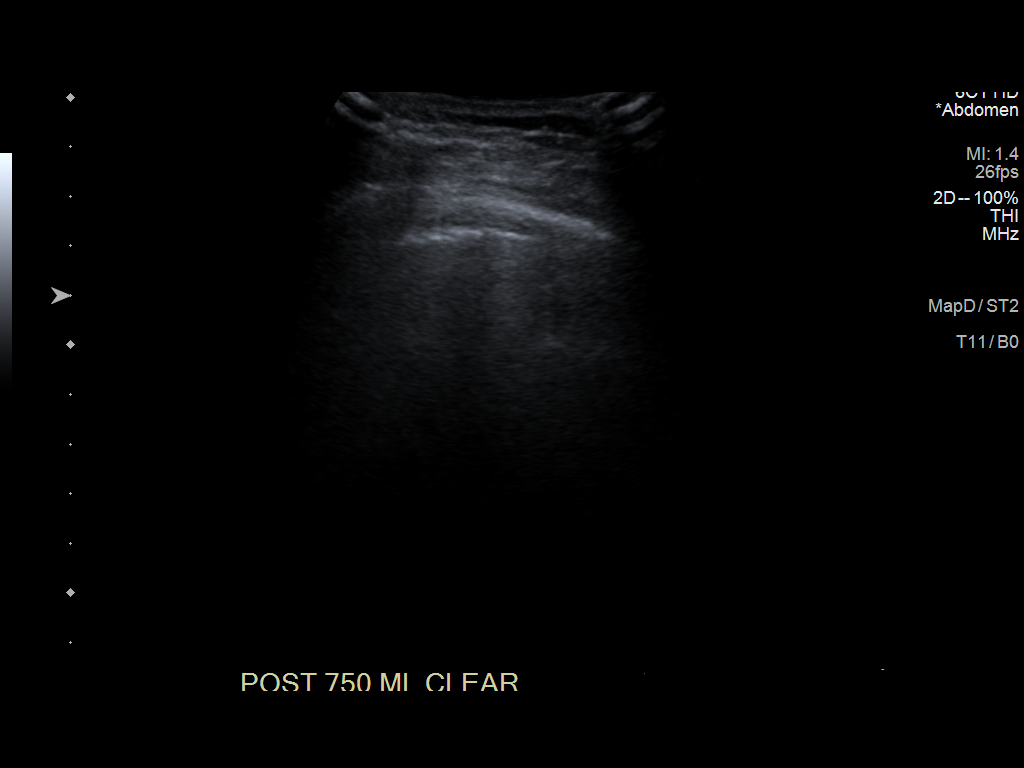

[7 of 7 positions shown; findings below may reference images not displayed]

EXAM:
ULTRASOUND GUIDED DIAGNOSTIC AND THERAPEUTIC RIGHT THORACENTESIS

MEDICATIONS:
10 mL 1% lidocaine

COMPLICATIONS:
None immediate.

PROCEDURE:
An ultrasound guided thoracentesis was thoroughly discussed with the
patient and questions answered. The benefits, risks, alternatives
and complications were also discussed. The patient understands and
wishes to proceed with the procedure. Written consent was obtained.

Ultrasound was performed to localize and mark an adequate pocket of
fluid in the right chest. The area was then prepped and draped in
the normal sterile fashion. 1% Lidocaine was used for local
anesthesia. Under ultrasound guidance a 6 Fr Safe-T-Centesis
catheter was introduced. Thoracentesis was performed. The catheter
was removed and a dressing applied.
FINDINGS: A total of approximately 750 cc of yellow fluid was removed. Samples
were sent to the laboratory as requested by the clinical team.
IMPRESSION: Successful ultrasound guided diagnostic and therapeutic right
thoracentesis yielding 750 cc of pleural fluid.

## 2021-07-12 IMAGING — DX DG CHEST 1V
1 series · 1 of 1 positions shown · non-contrast
Comparison: [DATE]

CLINICAL DATA: Status post right thoracentesis

EXAM:
CHEST  1 VIEW

[chest ap]
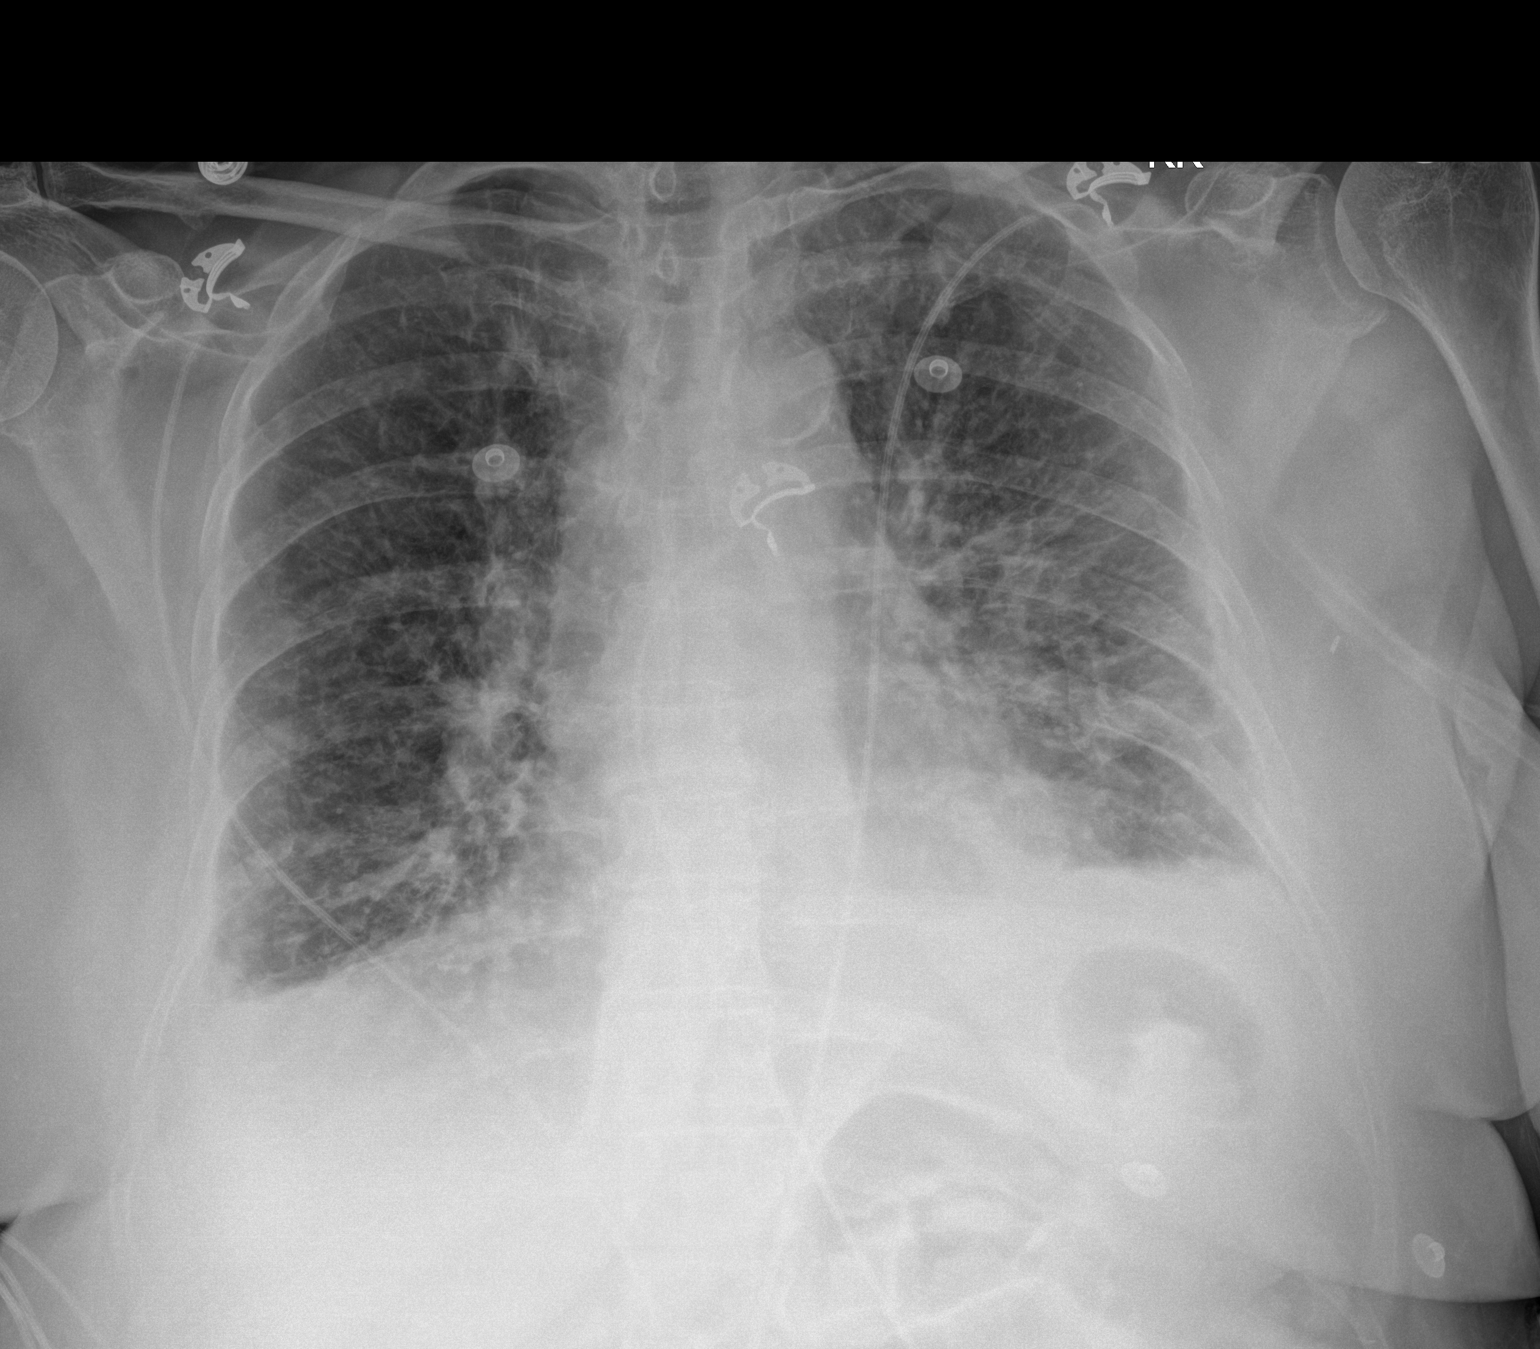

[1 of 1 positions shown; findings below may reference images not displayed]

FINDINGS: Improvement in the right effusion following thoracentesis. Trace
effusions remain bilaterally. No pneumothorax. Stable heart size.
Similar diffuse interstitial opacities suggesting edema and
persistent bibasilar atelectasis. Trachea midline. Aorta
atherosclerotic. Degenerative changes of the spine.
IMPRESSION: Negative for pneumothorax following right thoracentesis. Stable
exam.

## 2021-07-12 MED ORDER — OXYCODONE-ACETAMINOPHEN 5-325 MG PO TABS
1.0000 | ORAL_TABLET | Freq: Once | ORAL | Status: AC
  Administered 2021-07-12: 1 via ORAL
  Filled 2021-07-12: qty 1

## 2021-07-12 MED ORDER — ONDANSETRON HCL 4 MG PO TABS: 4.0000 mg | ORAL_TABLET | Freq: Four times a day (QID) | ORAL | Status: DC | PRN

## 2021-07-12 MED ORDER — HYDROCODONE-ACETAMINOPHEN 5-325 MG PO TABS
1.0000 | ORAL_TABLET | ORAL | Status: DC | PRN
  Administered 2021-07-12 – 2021-07-13 (×6): 2 via ORAL
  Filled 2021-07-12 (×6): qty 2

## 2021-07-12 MED ORDER — ONDANSETRON HCL 4 MG/2ML IJ SOLN: 4.0000 mg | Freq: Four times a day (QID) | INTRAMUSCULAR | Status: DC | PRN

## 2021-07-12 MED ORDER — ACETAMINOPHEN 325 MG PO TABS: 650.0000 mg | ORAL_TABLET | Freq: Four times a day (QID) | ORAL | Status: DC | PRN

## 2021-07-12 MED ORDER — ACETAMINOPHEN 650 MG RE SUPP: 650.0000 mg | Freq: Four times a day (QID) | RECTAL | Status: DC | PRN

## 2021-07-12 NOTE — Hospital Course (Addendum)
70 year old woman PMH breast cancer 2010, status postlumpectomy and radiation, with recently discovered widely metastatic disease bony in nature, as well as lung nodules, presenting with acute shortness of breath over the last 48 hours, found to be hypoxic. --10/14 admitted for new acute hypoxic respiratory failure with increased work of breathing and shortness of breath -- 10/15 more short of breath today, oxygen requirement appears stable, history and work-up suggest pleural effusions as driving etiology.  Would benefit from thoracentesis today, would not delay 48 hours.  Discussed with interventional radiology. --10/16 feeling better today, breathing better

## 2021-07-12 NOTE — Assessment & Plan Note (Addendum)
--   History and work-up thus far suggest most likely etiology is pain bilateral pleural effusions, suspect malignant in nature.  No signs or symptoms to suggest pneumonia, CTA was negative for PE. Echo unremarkable -- feels better today, will continue supplemental oxygen, wean oxygen, pursue thoracentesis on left tomorrow

## 2021-07-12 NOTE — TOC CM/SW Note (Signed)
TOC acknowledges consult for home o2 needs. Requested sat note and o2 orders. Will arrange o2 when these are complete.  Oleh Genin, [place]

## 2021-07-12 NOTE — Assessment & Plan Note (Addendum)
--   Recently discovered, plan was for outpatient cervical and thoracic MRI 10/15, but canceled as could not lie flat -- Will notify oncology and radiation oncology of admission.  She was planned for simulation this Monday. -- Sacral bone biopsy is planned in the future

## 2021-07-12 NOTE — Progress Notes (Signed)
  Chaplain On-Call responded to Order Requisition for Advance Directives information.  The patient's husband was in the room, and he informed this Chaplain that the patient was out of the room receiving a procedure to remove excess fluid from her lungs.  Chaplain provided the documents and education to the husband. He stated that he will read and discuss them with the patient.  Chaplain Pollyann Samples M.Div., [facility]

## 2021-07-12 NOTE — H&P (Signed)
History and Physical    Amber Blevins IWP:[id number] DOB: 10-27-1950 DOA: 07/11/2021  PCP: Gayland Curry, MD   Patient coming from: home  I have personally briefly reviewed patient's old medical records in [place]  Chief Complaint: shortness of breath  HPI: Amber Blevins is a 70 y.o. female with medical history significant for Breast cancer 2010 s/p lumpectomy, radiation, Femara x10 years, with recently discovered widely metastatic disease, lung nodules, pleural effusions seen by oncology on 10/5 who presents to the ED with increasing shortness of breath over the past 2 days, worse with exertion.  She denies chest pain, cough, fever or chills.  Denies abdominal pain, nausea vomiting or diarrhea  ED course: On arrival afebrile, tachypneic to 22 O2 sat initially 94% going as low as 88% per ED report requiring 2 L to maintain sats in the mid 90s Blood work with WBC 10.9, lactic acid 1.3, troponin 6, BNP 30.  COVID-negative  CTA chest no PE.  Large pleural effusions and mild to moderate interstitial pulmonary edema.  Bulky mediastinal and hilar lymphadenopathy unchanged  EKG, personally viewed and interpreted: Normal sinus rhythm at 92 with no acute ST-T wave changes  Patient was initially treated with Rocephin and azithromycin due to chest x-ray showing possible early infiltrate.  Hospitalist consulted for admission.  Review of Systems: As per HPI otherwise all other systems on review of systems negative.    Past Medical History:  Diagnosis Date   Breast cancer ([place])    High cholesterol    Hypertension    Personal history of radiation therapy     Past Surgical History:  Procedure Laterality Date   BREAST LUMPECTOMY Left    2010     reports that she quit smoking about 32 years ago. Her smoking use included cigarettes. She has a 25.00 pack-year smoking history. She has never used smokeless tobacco. She reports current alcohol use. She reports that she does  not use drugs.  Allergies  Allergen Reactions   Green Tea (Camellia Sinensis) Hives   Melaleuca Viridiflora Hives   Lisinopril Other (See Comments)    Hyperkalemia    Family History  Problem Relation Age of Onset   Cancer Mother        gynecological   Lung cancer Mother    Diabetes Father    Heart disease Father    Parkinson's disease Father    Brain cancer Brother    Bladder Cancer Brother    Pulmonary disease Brother    Rheumatic fever Brother    Breast cancer Neg Hx       Prior to Admission medications   Medication Sig Start Date End Date Taking? Authorizing Provider  albuterol (VENTOLIN HFA) 108 (90 Base) MCG/ACT inhaler Inhale into the lungs. 06/23/21 06/23/22  [provider]  aspirin 81 MG EC tablet Take by mouth. 04/21/21 04/21/22  [provider]  atorvastatin (LIPITOR) 40 MG tablet Take by mouth. 11/25/20   [provider]  Boswellia-Glucosamine-Vit D (OSTEO BI-FLEX-GLUCOS/5-LOXIN) TABS Take 1 capsule by mouth in the morning and at bedtime.    [provider]  Calcium Carb-Cholecalciferol 600-400 MG-UNIT TABS Take by mouth.    [provider]  Docusate Sodium (DSS) 100 MG CAPS Take 1 capsule by mouth in the morning and at bedtime.    [provider]  Fluticasone-Umeclidin-Vilant (TRELEGY ELLIPTA) 100-62.5-25 MCG/INH AEPB Inhale into the lungs. Inhale 1 inhalation into the lungs once daily 06/23/21   [provider]  gabapentin (NEURONTIN) 300  MG capsule Take 2 capsules by mouth in the morning, at noon, and at bedtime. 02/12/21 02/12/22  [provider]  hydrochlorothiazide (HYDRODIURIL) 25 MG tablet Take 25 mg by mouth daily. 04/13/21   [provider]  ibuprofen (ADVIL) 200 MG tablet Take by mouth. Take 400 mg by mouth every six (6) hours as needed for pain.    [provider]  letrozole (FEMARA) 2.5 MG tablet Take 1 tablet by mouth daily. Take 1 tablet by mouth once daily 02/12/10    [provider]  metoprolol-hydrochlorothiazide (LOPRESSOR HCT) 50-25 MG tablet Take 1 tablet by mouth daily. Take 1 tablet by mouth once daily 04/21/21 04/21/22  [provider]  traMADol (ULTRAM) 50 MG tablet Take 1 tablet (50 mg total) by mouth every 6 (six) hours as needed. 07/02/21   [name], MD    Physical Exam: Vitals:   07/11/21 2220 07/11/21 2222  SpO2: 94%   Weight:  81.6 kg  Height:  5\' 5"  (1.651 m)     Vitals:   07/11/21 2220 07/11/21 2222  SpO2: 94%   Weight:  81.6 kg  Height:  5\' 5"  (1.651 m)      Constitutional: Alert and oriented x 3 .  Conversational dyspnea HEENT:      Head: Normocephalic and atraumatic.         Eyes: PERLA, EOMI, Conjunctivae are normal. Sclera is non-icteric.       Mouth/Throat: Mucous membranes are moist.       Neck: Supple with no signs of meningismus. Cardiovascular: Regular rate and rhythm. No murmurs, gallops, or rubs. 2+ symmetrical distal pulses are present . No JVD. No LE edema Respiratory: Respiratory effort increased, tachypneic.  Speaking in 1-2 words sentences.  Breath sounds diminished bilaterally..  Bilateral rales Gastrointestinal: Soft, non tender, and non distended with positive bowel sounds.  Genitourinary: No CVA tenderness. Musculoskeletal: Nontender with normal range of motion in all extremities. No cyanosis, or erythema of extremities. Neurologic:  Face is symmetric. Moving all extremities. No gross focal neurologic deficits . Skin: Skin is warm, dry.  No rash or ulcers Psychiatric: Mood and affect are normal    Labs on Admission: I have personally reviewed following labs and imaging studies  CBC: Recent Labs  Lab 07/11/21 2216  WBC 10.9*  HGB 14.2  HCT 41.3  MCV 85.7  PLT 366   Basic Metabolic Panel: Recent Labs  Lab 07/11/21 2216  NA 137  K 3.7  CL 99  CO2 27  GLUCOSE 110*  BUN 18  CREATININE 0.72  CALCIUM 9.8   GFR: Estimated Creatinine Clearance: 68 mL/min (by C-G formula  based on SCr of 0.72 mg/dL). Liver Function Tests: No results for input(s): AST, ALT, ALKPHOS, BILITOT, PROT, ALBUMIN in the last 168 hours. No results for input(s): LIPASE, AMYLASE in the last 168 hours. No results for input(s): AMMONIA in the last 168 hours. Coagulation Profile: No results for input(s): INR, PROTIME in the last 168 hours. Cardiac Enzymes: No results for input(s): CKTOTAL, CKMB, CKMBINDEX, TROPONINI in the last 168 hours. BNP (last 3 results) No results for input(s): PROBNP in the last 8760 hours. HbA1C: No results for input(s): HGBA1C in the last 72 hours. CBG: No results for input(s): GLUCAP in the last 168 hours. Lipid Profile: No results for input(s): CHOL, HDL, LDLCALC, TRIG, CHOLHDL, LDLDIRECT in the last 72 hours. Thyroid Function Tests: No results for input(s): TSH, T4TOTAL, FREET4, T3FREE, THYROIDAB in the last 72 hours. Anemia Panel: No  results for input(s): VITAMINB12, FOLATE, FERRITIN, TIBC, IRON, RETICCTPCT in the last 72 hours. Urine analysis: No results found for: COLORURINE, APPEARANCEUR, LABSPEC, [place], GLUCOSEU, HGBUR, BILIRUBINUR, KETONESUR, PROTEINUR, UROBILINOGEN, NITRITE, LEUKOCYTESUR  Radiological Exams on Admission: DG Chest 2 View  Result Date: 07/11/2021 CLINICAL DATA:  Worsening shortness of breath. EXAM: CHEST - 2 VIEW COMPARISON:  None. FINDINGS: Mild to moderate severity diffusely increased interstitial lung markings are seen. Mild to moderate severity areas of bibasilar atelectasis and/or early infiltrate are also present. There are small bilateral pleural effusions. No pneumothorax is identified. The heart size and mediastinal contours are within normal limits. There is moderate severity calcification of the aortic arch. The visualized skeletal structures are unremarkable. IMPRESSION: 1. Mild to moderate severity interstitial edema with bibasilar atelectasis and/or early infiltrate. 2. Small bilateral pleural effusions. Electronically  Signed   By: Virgina Norfolk M.D.   On: 07/11/2021 23:03   CT Angio Chest PE W/Cm &/Or Wo Cm  Result Date: 07/12/2021 CLINICAL DATA:  Shortness of breath EXAM: CT ANGIOGRAPHY CHEST WITH CONTRAST TECHNIQUE: Multidetector CT imaging of the chest was performed using the standard protocol during bolus administration of intravenous contrast. Multiplanar CT image reconstructions and MIPs were obtained to evaluate the vascular anatomy. CONTRAST:  53mL OMNIPAQUE IOHEXOL 350 MG/ML SOLN COMPARISON:  None. FINDINGS: Cardiovascular: Contrast injection is sufficient to demonstrate satisfactory opacification of the pulmonary arteries to the segmental level. There is no pulmonary embolus or evidence of right heart strain. The size of the main pulmonary artery is normal. Heart size is normal, with no pericardial effusion. The course and caliber of the aorta are normal. There is atherosclerotic calcification. No acute aortic syndrome. Mediastinum/Nodes: There is mediastinal and hilar lymphadenopathy that is unchanged. Right hilar nodes cause marked narrowing of right middle lobe arterial branches. No axillary adenopathy is visible. Soft tissue focus in the outer left breast is unchanged. Lungs/Pleura: Large pleural effusions. Airways are patent. There is mild-to-moderate interstitial pulmonary edema. Upper Abdomen: Contrast bolus timing is not optimized for evaluation of the abdominal organs. Incompletely visualized left adrenal nodule measures at least 11 mm. Musculoskeletal: No chest wall abnormality. No bony spinal canal stenosis. Review of the MIP images confirms the above findings. IMPRESSION: 1. No pulmonary embolus or acute aortic syndrome. 2. Large pleural effusions and mild-to-moderate interstitial pulmonary edema. 3. Bulky mediastinal and hilar lymphadenopathy, unchanged. 4. Incompletely visualized left adrenal nodule. Electronically Signed   By: Ulyses Jarred M.D.   On: 07/12/2021 00:28      Assessment/Plan Principal Problem:   Acute respiratory failure with hypoxia (HCC)   Bilateral pleural effusions -Patient with progressively worsening dyspnea on exertion, speaking in short sentences, O2 sat 88% on room air in the ED requiring 2 L - CTA chest negative for PE but showing large pleural effusions and bulky mediastinal and hilar lymphadenopathy - Possible early infiltrate was seen on chest x-ray and antibiotics were given in the ED but not continued at this time as low suspicion for pneumonia - Follow procalcitonin - Supplemental oxygen - IR consult for thoracentesis    Mediastinal lymphadenopathy   History of breast cancer   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  Continue home meds    DVT prophylaxis: SCDs for thoracentesis  code Status: full code  Family Communication: Husband at bedside Disposition Plan: Back to previous home environment Consults called: IR Status:At the time of admission, it appears  that the appropriate admission status for this patient is INPATIENT. This is judged to be reasonable and necessary in order to provide the required intensity of service to ensure the patient's safety given the presenting symptoms, physical exam findings, and initial radiographic and laboratory data in the context of their  Comorbid conditions.   Patient requires inpatient status due to high intensity of service, high risk for further deterioration and high frequency of surveillance required.   I certify that at the point of admission it is my clinical judgment that the patient will require inpatient hospital care spanning beyond [place] MD [facility]     07/12/2021, 1:41 AM

## 2021-07-12 NOTE — Assessment & Plan Note (Signed)
--   Resume statin 

## 2021-07-12 NOTE — Procedures (Addendum)
Ultrasound-guided diagnostic and therapeutic right thoracentesis performed yielding 750 cc of yellow fluid. No immediate complications. Follow-up chest x-ray pending. The fluid was sent to the lab for preordered studies. EBL< 2 cc. Effusions were small to mod in size by today's [place].

## 2021-07-12 NOTE — Assessment & Plan Note (Signed)
--   Approximately 10 years ago per patient.

## 2021-07-12 NOTE — Progress Notes (Signed)
Progress Note    Amber Blevins   VPX:[id number]  DOB: September 01, 1951  DOA: 07/11/2021     0 Date of Service: 07/12/2021   Clinical Course 70 year old woman PMH breast cancer 2010, status postlumpectomy and radiation, with recently discovered widely metastatic disease bony in nature, as well as lung nodules, presenting with acute shortness of breath over the last 48 hours, found to be hypoxic. --10/14 admitted for new acute hypoxic respiratory failure with increased work of breathing and shortness of breath -- 10/15 more short of breath today, oxygen requirement appears stable, history and work-up suggest pleural effusions as driving etiology.  Would benefit from thoracentesis today, would not delay 48 hours.  Discussed with interventional radiology.  Assessment and Plan * Acute respiratory failure with hypoxia (HCC) -- History and work-up thus far suggest most likely etiology is pain bilateral pleural effusions, suspect malignant in nature.  No signs or symptoms to suggest pneumonia, CTA was negative for PE.  No signs or symptoms to suggest cardiac. -- Continue supplemental oxygen, pursue thoracentesis, for completeness we will check echocardiogram  Bilateral pleural effusion -- In the setting of recently discovered bony metastatic disease and chest lymphadenopathy.  Suspect malignant in nature.  Pursue thoracentesis diagnostic and therapeutic given new hypoxic respiratory failure, subjective worsening today, mild shortness of breath on exam.  While not emergent in nature presently, I do not think it should wait 48 hours without intervention.  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 -- Will notify oncology and radiation oncology of admission.  She was planned for simulation this Monday. -- Sacral  bone biopsy is planned in the future  Aortic atherosclerosis (HCC) -- Resume statin  History of breast cancer -- Approximately 10 years ago per patient.  Subjective:  Feels more short of breath today compared to yesterday, not previously on oxygen, breathing difficulties began about 48 hours ago.  Long history of smoking quit in the 90s.  Breathing not usually an issue for her, does not limit activities normally.  Objective Vitals:   07/12/21 0220 07/12/21 0318 07/12/21 0330 07/12/21 0734  BP:  (!) 148/84  (!) 151/79  Pulse:  92  100  Resp:  20  16  Temp: 98.3 F (36.8 C) 98 F (36.7 C)  97.8 F (36.6 C)  TempSrc: Oral Oral    SpO2:  98%  98%  Weight:   82.6 kg   Height:   5\' 5"  (1.651 m)    82.6 kg  Vital signs were reviewed and unremarkable except for:   Requiring supplemental oxygen nasal cannula  Exam Physical Exam Vitals reviewed.  Constitutional:      General: She is not in acute distress.    Appearance: She is ill-appearing. She is not toxic-appearing.  Cardiovascular:     Rate and Rhythm: Normal rate and regular rhythm.     Heart sounds: No murmur heard.   No friction rub. No gallop.  Pulmonary:     Breath sounds: No wheezing, rhonchi or rales.     Comments: Secondary to decreased breath sounds bilaterally posteriorly mid and lower lung fields.  Mild increased respiratory effort and shortness of breath, able to speak in sentences but tachypneic and dyspneic in bed. Musculoskeletal:     Right lower leg: No edema.     Left lower leg: No edema.  Neurological:     Mental Status: She is alert.  Psychiatric:        Mood and Affect: Mood normal.        Behavior: Behavior normal.    Labs / Other Information My review of labs, imaging, notes and other tests is significant for     BMP and CBC unremarkable, lactic acid within normal limits.  CT chest noted.  Disposition Plan: Status is: Inpatient  Remains inpatient appropriate because: Acute hypoxic  respiratory failure requiring oxygen, secondary to bilateral pleural effusions, with subjective worsening of breathing today.  Needs further evaluation and thoracentesis.  Discussed with husband at bedside SCDs for DVT prophylaxis until procedure is complete  Time spent: 35 minutes [facility] 07/12/2021, 10:57 AM

## 2021-07-12 NOTE — Plan of Care (Signed)

## 2021-07-12 NOTE — Assessment & Plan Note (Addendum)
--  feels better today -- In the setting of recently discovered bony metastatic disease and chest lymphadenopathy.  Suspect malignant in nature.   --s/p right thoracentesis diagnostic and therapeutic 10/15 --will pursue left side tomorrow

## 2021-07-13 ENCOUNTER — Inpatient Hospital Stay (HOSPITAL_COMMUNITY)
Admit: 2021-07-13 | Discharge: 2021-07-13 | Disposition: A | Discharge status: Home / Self Care | Payer: Medicare PPO | Attending: Family Medicine | Admitting: Family Medicine

## 2021-07-13 DIAGNOSIS — R0602 Shortness of breath: Secondary | ICD-10-CM | POA: Diagnosis not present

## 2021-07-13 DIAGNOSIS — C7951 Secondary malignant neoplasm of bone: Secondary | ICD-10-CM | POA: Diagnosis not present

## 2021-07-13 DIAGNOSIS — J90 Pleural effusion, not elsewhere classified: Secondary | ICD-10-CM | POA: Diagnosis not present

## 2021-07-13 DIAGNOSIS — C801 Malignant (primary) neoplasm, unspecified: Secondary | ICD-10-CM | POA: Diagnosis not present

## 2021-07-13 DIAGNOSIS — J9601 Acute respiratory failure with hypoxia: Secondary | ICD-10-CM | POA: Diagnosis not present

## 2021-07-13 LAB — ECHOCARDIOGRAM COMPLETE
AR max vel: 1.73 cm2
AV Peak grad: 9.4 mmHg
Ao pk vel: 1.53 m/s
Area-P 1/2: 4.41 cm2
Height: 65 in
S' Lateral: 2.7 cm
Weight: 2913.6 oz

## 2021-07-13 LAB — SARS CORONAVIRUS 2 (TAT 6-24 HRS): SARS Coronavirus 2: NEGATIVE

## 2021-07-13 LAB — GLUCOSE, CAPILLARY: Glucose-Capillary: 88 mg/dL (ref 70–99)

## 2021-07-13 LAB — PH, BODY FLUID: pH, Body Fluid: 7.6

## 2021-07-13 MED ORDER — FLUTICASONE FUROATE-VILANTEROL 100-25 MCG/INH IN AEPB
1.0000 | INHALATION_SPRAY | Freq: Every day | RESPIRATORY_TRACT | Status: DC
  Administered 2021-07-13 – 2021-07-14 (×2): 1 via RESPIRATORY_TRACT
  Filled 2021-07-13: qty 28

## 2021-07-13 MED ORDER — METOPROLOL-HYDROCHLOROTHIAZIDE 50-25 MG PO TABS: 1.0000 | ORAL_TABLET | Freq: Every day | ORAL | Status: DC

## 2021-07-13 MED ORDER — METOPROLOL TARTRATE 50 MG PO TABS
50.0000 mg | ORAL_TABLET | Freq: Every day | ORAL | Status: DC
  Administered 2021-07-13 – 2021-07-14 (×2): 50 mg via ORAL
  Filled 2021-07-13 (×2): qty 1

## 2021-07-13 MED ORDER — ALBUTEROL SULFATE (2.5 MG/3ML) 0.083% IN NEBU: 3.0000 mL | INHALATION_SOLUTION | Freq: Four times a day (QID) | RESPIRATORY_TRACT | Status: DC | PRN

## 2021-07-13 MED ORDER — UMECLIDINIUM BROMIDE 62.5 MCG/INH IN AEPB
1.0000 | INHALATION_SPRAY | Freq: Every day | RESPIRATORY_TRACT | Status: DC
  Administered 2021-07-13 – 2021-07-14 (×2): 1 via RESPIRATORY_TRACT
  Filled 2021-07-13: qty 7

## 2021-07-13 MED ORDER — GABAPENTIN 300 MG PO CAPS
600.0000 mg | ORAL_CAPSULE | Freq: Three times a day (TID) | ORAL | Status: DC
  Administered 2021-07-13 – 2021-07-14 (×3): 600 mg via ORAL
  Filled 2021-07-13 (×3): qty 2

## 2021-07-13 MED ORDER — ATORVASTATIN CALCIUM 20 MG PO TABS
40.0000 mg | ORAL_TABLET | Freq: Every day | ORAL | Status: DC
  Administered 2021-07-13: 40 mg via ORAL
  Filled 2021-07-13: qty 2

## 2021-07-13 MED ORDER — HYDROCHLOROTHIAZIDE 25 MG PO TABS
25.0000 mg | ORAL_TABLET | Freq: Every day | ORAL | Status: DC
  Administered 2021-07-13 – 2021-07-14 (×2): 25 mg via ORAL
  Filled 2021-07-13 (×2): qty 1

## 2021-07-13 MED ORDER — FLUTICASONE-UMECLIDIN-VILANT 100-62.5-25 MCG/INH IN AEPB: 1.0000 | INHALATION_SPRAY | Freq: Every day | RESPIRATORY_TRACT | Status: DC

## 2021-07-13 MED ORDER — OXYCODONE HCL 5 MG PO TABS
5.0000 mg | ORAL_TABLET | ORAL | Status: DC | PRN
  Administered 2021-07-13 – 2021-07-14 (×4): 5 mg via ORAL
  Filled 2021-07-13 (×4): qty 1

## 2021-07-13 MED ORDER — ASPIRIN EC 81 MG PO TBEC
81.0000 mg | DELAYED_RELEASE_TABLET | Freq: Every day | ORAL | Status: DC
  Administered 2021-07-13 – 2021-07-14 (×2): 81 mg via ORAL
  Filled 2021-07-13 (×2): qty 1

## 2021-07-13 MED ORDER — TRAMADOL HCL 50 MG PO TABS: 50.0000 mg | ORAL_TABLET | Freq: Four times a day (QID) | ORAL | Status: DC | PRN

## 2021-07-13 MED ORDER — TRAMADOL HCL 50 MG PO TABS
50.0000 mg | ORAL_TABLET | Freq: Four times a day (QID) | ORAL | Status: DC | PRN
  Administered 2021-07-13: 50 mg via ORAL
  Filled 2021-07-13: qty 1

## 2021-07-13 NOTE — Progress Notes (Signed)
  Progress Note    Amber Blevins   [id number]  DOB: 29-Aug-1951  DOA: 07/11/2021     1 Date of Service: 07/13/2021   Clinical Course 70 year old woman PMH breast cancer 2010, status postlumpectomy and radiation, with recently discovered widely metastatic disease bony in nature, as well as lung nodules, presenting with acute shortness of breath over the last 48 hours, found to be hypoxic. --10/14 admitted for new acute hypoxic respiratory failure with increased work of breathing and shortness of breath -- 10/15 more short of breath today, oxygen requirement appears stable, history and work-up suggest pleural effusions as driving etiology.  Would benefit from thoracentesis today, would not delay 48 hours.  Discussed with interventional radiology. --10/16 feeling better today, breathing better  Assessment and Plan * Acute respiratory failure with hypoxia (HCC) -- History and work-up thus far suggest most likely etiology is pain bilateral pleural effusions, suspect malignant in nature.  No signs or symptoms to suggest pneumonia, CTA was negative for PE. Echo unremarkable -- feels better today, will continue supplemental oxygen, wean oxygen, pursue thoracentesis on left tomorrow  Bilateral pleural effusion --feels better today -- In the setting of recently discovered bony metastatic disease and chest lymphadenopathy.  Suspect malignant in nature.   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  She was planned for simulation this Monday. -- Sacral bone biopsy is planned in the future  Aortic atherosclerosis (HCC) -- Resume statin  History of breast cancer -- Approximately 10 years ago per patient.   Subjective:   Feels better, breathing better  Objective Vitals:   07/12/21 2010 07/13/21 0454 07/13/21 0648 07/13/21 0758  BP: 134/71 112/74 (!) 150/80 (!) 142/83  Pulse: 91 85 89 89  Resp: 16 16 20 17   Temp: 97.9 F (36.6 C)  98.1 F (36.7 C) 97.8 F (36.6 C)  TempSrc:      SpO2: 99% 97% 96% 97%  Weight:      Height:       82.6 kg  Vital signs were reviewed and unremarkable. Still on 2-3L [place]  Exam Physical Exam Vitals reviewed.  Constitutional:      General: She is not in acute distress.    Appearance: She is not ill-appearing or toxic-appearing.  Cardiovascular:     Rate and Rhythm: Normal rate and regular rhythm.     Heart sounds: No murmur heard. Pulmonary:     Effort: No respiratory distress.     Breath sounds: No wheezing, rhonchi or rales.     Comments: Decreased BS on left Neurological:     Mental Status: She is alert.  Psychiatric:        Mood and Affect: Mood normal.        Behavior: Behavior normal.    Labs / Other Information My review of labs, imaging, notes and other tests is significant for     echo unremarkable, pleural culture pending  Disposition Plan: Status is: Inpatient  Remains inpatient appropriate because: hypoxia, still has left pleural effusion  Updated husband at bedside Full code SCDs  Time spent: 20 minutes [facility] 07/13/2021, 2:49 PM

## 2021-07-13 NOTE — Progress Notes (Signed)
*  PRELIMINARY RESULTS* Echocardiogram 2D Echocardiogram has been performed.  Amber Blevins 07/13/2021, 12:55 PM

## 2021-07-14 ENCOUNTER — Other Ambulatory Visit: Payer: Self-pay | Admitting: Physician Assistant

## 2021-07-14 ENCOUNTER — Ambulatory Visit: Payer: Medicare PPO

## 2021-07-14 ENCOUNTER — Other Ambulatory Visit: Payer: Self-pay | Admitting: Radiology

## 2021-07-14 ENCOUNTER — Inpatient Hospital Stay: Payer: Medicare PPO

## 2021-07-14 DIAGNOSIS — Z510 Encounter for antineoplastic radiation therapy: Secondary | ICD-10-CM | POA: Insufficient documentation

## 2021-07-14 DIAGNOSIS — C7951 Secondary malignant neoplasm of bone: Secondary | ICD-10-CM | POA: Diagnosis not present

## 2021-07-14 DIAGNOSIS — Z79899 Other long term (current) drug therapy: Secondary | ICD-10-CM | POA: Insufficient documentation

## 2021-07-14 DIAGNOSIS — C773 Secondary and unspecified malignant neoplasm of axilla and upper limb lymph nodes: Secondary | ICD-10-CM | POA: Insufficient documentation

## 2021-07-14 DIAGNOSIS — C7800 Secondary malignant neoplasm of unspecified lung: Secondary | ICD-10-CM | POA: Insufficient documentation

## 2021-07-14 DIAGNOSIS — C7970 Secondary malignant neoplasm of unspecified adrenal gland: Secondary | ICD-10-CM | POA: Insufficient documentation

## 2021-07-14 DIAGNOSIS — C801 Malignant (primary) neoplasm, unspecified: Secondary | ICD-10-CM | POA: Diagnosis not present

## 2021-07-14 DIAGNOSIS — J9601 Acute respiratory failure with hypoxia: Secondary | ICD-10-CM | POA: Diagnosis not present

## 2021-07-14 DIAGNOSIS — E7800 Pure hypercholesterolemia, unspecified: Secondary | ICD-10-CM | POA: Insufficient documentation

## 2021-07-14 DIAGNOSIS — C50919 Malignant neoplasm of unspecified site of unspecified female breast: Secondary | ICD-10-CM | POA: Insufficient documentation

## 2021-07-14 DIAGNOSIS — C787 Secondary malignant neoplasm of liver and intrahepatic bile duct: Secondary | ICD-10-CM | POA: Insufficient documentation

## 2021-07-14 DIAGNOSIS — Z853 Personal history of malignant neoplasm of breast: Secondary | ICD-10-CM | POA: Insufficient documentation

## 2021-07-14 DIAGNOSIS — I10 Essential (primary) hypertension: Secondary | ICD-10-CM | POA: Insufficient documentation

## 2021-07-14 DIAGNOSIS — Z923 Personal history of irradiation: Secondary | ICD-10-CM | POA: Insufficient documentation

## 2021-07-14 DIAGNOSIS — J90 Pleural effusion, not elsewhere classified: Secondary | ICD-10-CM | POA: Diagnosis not present

## 2021-07-14 DIAGNOSIS — M5126 Other intervertebral disc displacement, lumbar region: Secondary | ICD-10-CM | POA: Insufficient documentation

## 2021-07-14 DIAGNOSIS — Z87891 Personal history of nicotine dependence: Secondary | ICD-10-CM | POA: Insufficient documentation

## 2021-07-14 DIAGNOSIS — G893 Neoplasm related pain (acute) (chronic): Secondary | ICD-10-CM | POA: Insufficient documentation

## 2021-07-14 IMAGING — US US CHEST/MEDIASTINUM
1 series · 4 of 4 positions shown · non-contrast
Comparison: None.

CLINICAL DATA: 70-year-old female with history of pleural effusion.

EXAM:
CHEST ULTRASOUND

[Series 1: us chest/mediastinum · 0.26mm/px · 4 of 4 slices shown]
[im 1/4]
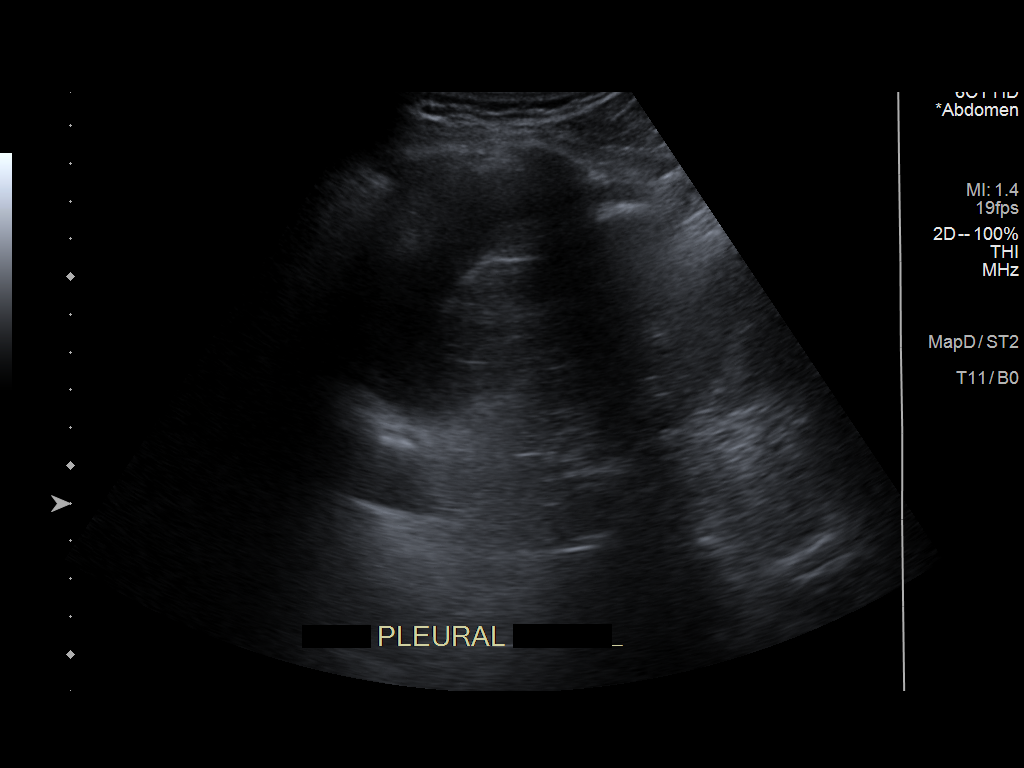
[im 2/4]
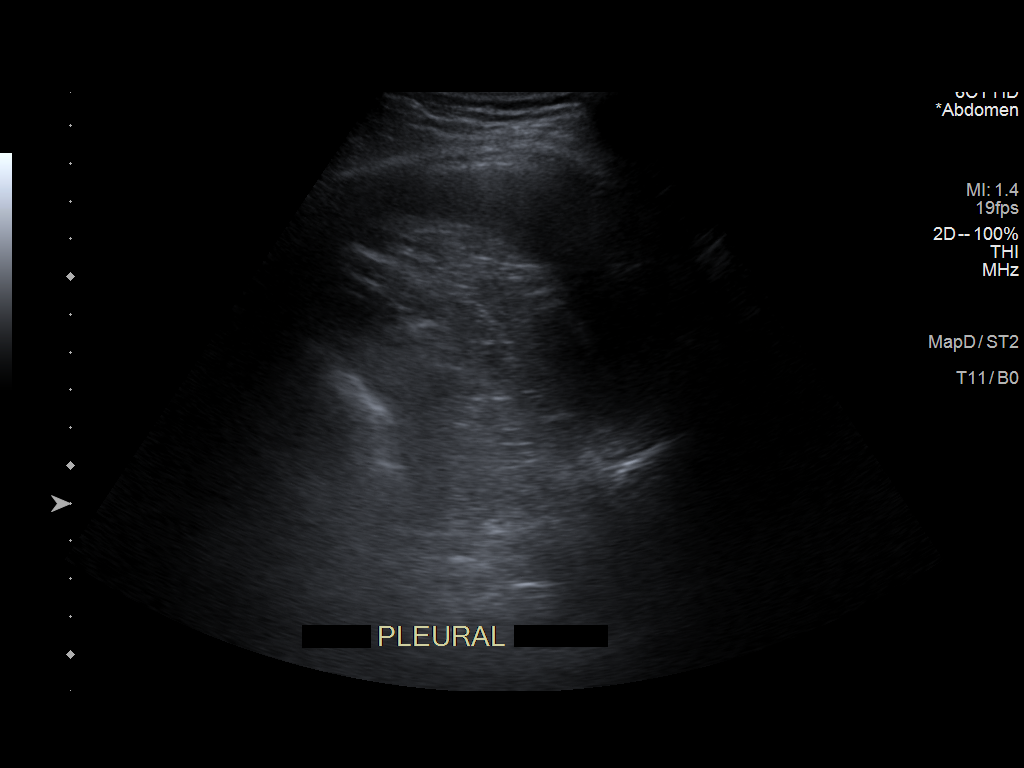
[im 3/4]
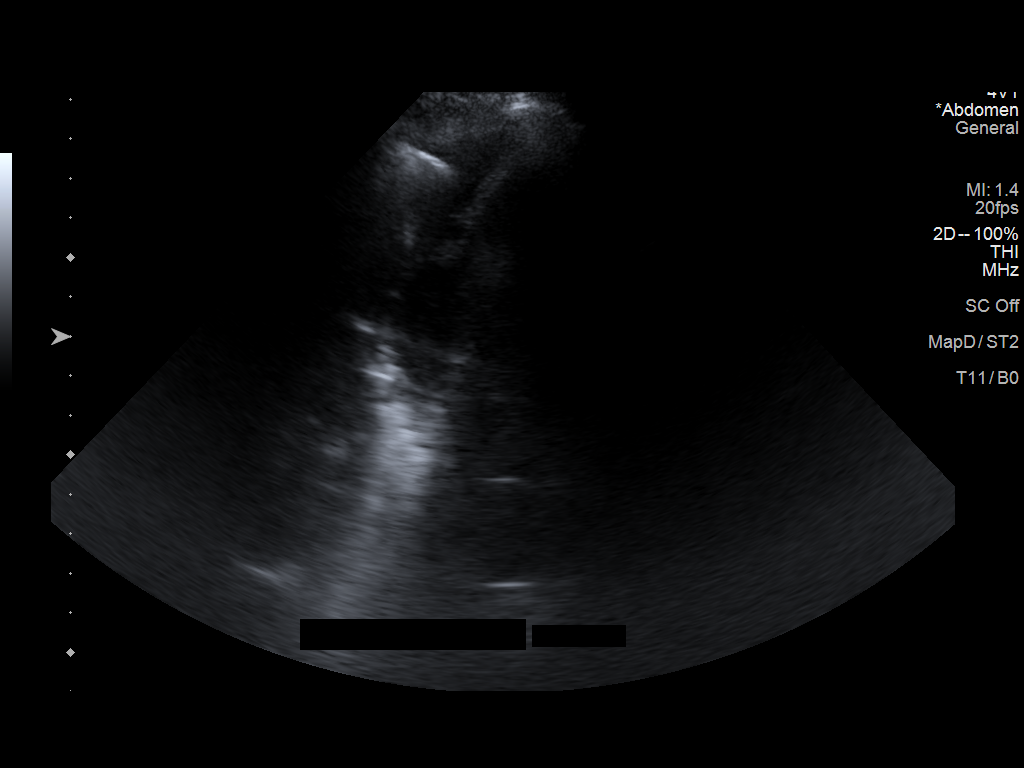
[im 4/4]
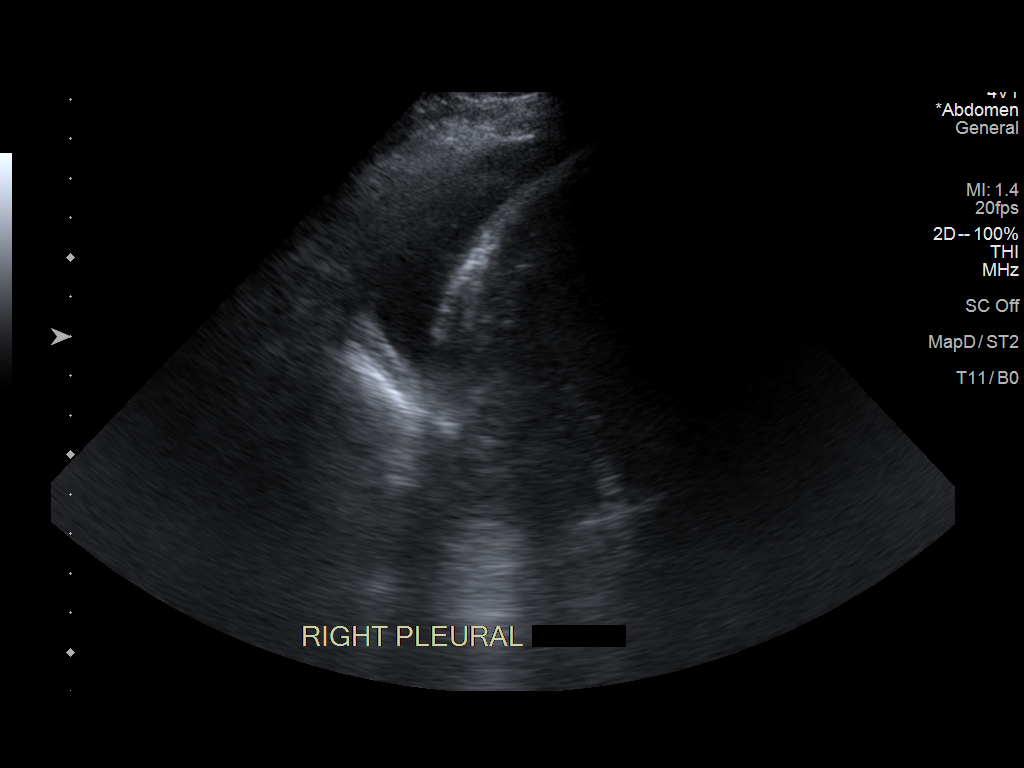

[4 of 4 positions shown; findings below may reference images not displayed]

FINDINGS: Trace bilateral pleural effusions.
IMPRESSION: Trace bilateral pleural effusions. No safe window for thoracentesis.

## 2021-07-14 NOTE — Progress Notes (Signed)
Patient alert and oriented x4, complains of pain to her back, relived with prn pain medications. Patient independent and ambulatory with walker within room. Vitals stable, dyspnea on exertion, remains on 1 liter via nasal canula with sats >95%. Will continue to monitor.

## 2021-07-14 NOTE — Discharge Summary (Signed)
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 --10/14 admitted for new acute hypoxic respiratory failure with increased work of breathing and shortness of breath -- 10/15 more short of breath today, oxygen requirement appears stable, history and work-up suggest pleural effusions as driving etiology.  Would benefit from thoracentesis today, would not delay 48 hours.  Discussed with interventional radiology. --10/16 feeling better today, breathing better --10/17 weaned off oxygen, not enough fluid to pursue thoracentesis on the left.  Updated primary oncologist of hospitalization, discharge, she will reschedule MRIs.  Updated Dr. Baruch Gouty.  * Acute respiratory failure with hypoxia ([place]) -- History and work-up thus far suggest most likely etiology is pain bilateral pleural effusions, suspect malignant in nature.  No signs or symptoms to suggest pneumonia, CTA was negative for PE. Echo unremarkable -- Weaned off oxygen, did not require left-sided thoracentesis after home.  Discharged home in good condition.  Bilateral pleural effusion --feels better today -- In the setting of recently discovered bony metastatic disease and chest lymphadenopathy.   Suspect malignant in nature.   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  Procedures performed: Right thoracentesis diagnostic and therapeutic  Condition at discharge: good  Feels better  Exam Physical Exam Vitals reviewed.  Constitutional:      General: She is not in acute distress.    Appearance: She is not ill-appearing or toxic-appearing.  Cardiovascular:     Rate and Rhythm: Normal rate and regular rhythm.     Heart sounds: No murmur heard. Pulmonary:     Effort: Pulmonary effort is normal. No respiratory distress.     Breath sounds: No wheezing, rhonchi or rales.  Neurological:     Mental Status: She is alert.  Psychiatric:        Mood and Affect: Mood normal.        Behavior: Behavior normal.    Disposition: Home  Discharge time: less than 30 minutes.  Follow-up Information     [name], MD Follow up.   Specialty: Oncology Why: Office will contact you with an appointment Contact information: [place] [address] [phone]                 Allergies as of 07/14/2021       Reactions   Green Tea (camellia Sinensis) Hives   Melaleuca Viridiflora Hives   Lisinopril Other (See Comments)   Hyperkalemia  Medication List     STOP taking these medications    hydrochlorothiazide 25 MG tablet Commonly known as: HYDRODIURIL   letrozole 2.5 MG tablet Commonly known as: FEMARA       TAKE these medications    Acetaminophen 8 Hour 650 MG CR tablet Generic drug: acetaminophen Take 650 mg by mouth every  8 (eight) hours as needed for pain.   albuterol 108 (90 Base) MCG/ACT inhaler Commonly known as: VENTOLIN HFA Inhale 2 puffs into the lungs every 6 (six) hours as needed.   aspirin 81 MG EC tablet Take by mouth.   atorvastatin 40 MG tablet Commonly known as: LIPITOR Take 40 mg by mouth at bedtime.   Calcium Carb-Cholecalciferol 600-400 MG-UNIT Tabs Take 1 tablet by mouth daily.   DSS 100 MG Caps Take 1 capsule by mouth every other day.   gabapentin 300 MG capsule Commonly known as: NEURONTIN Take 2 capsules by mouth in the morning, at noon, and at bedtime.   ibuprofen 200 MG tablet Commonly known as: ADVIL Take by mouth. Take 400 mg by mouth every six (6) hours as needed for pain.   magnesium chloride 64 MG Tbec SR tablet Commonly known as: SLOW-MAG Take 1 tablet by mouth at bedtime.   metoprolol-hydrochlorothiazide 50-25 MG tablet Commonly known as: LOPRESSOR HCT Take 1 tablet by mouth daily. Take 1 tablet by mouth once daily   Osteo Bi-Flex-Glucos/5-Loxin Tabs Take 1 capsule by mouth in the morning and at bedtime.   QC TUMERIC COMPLEX PO Take 1 capsule by mouth at bedtime.   traMADol 50 MG tablet Commonly known as: ULTRAM Take 1 tablet (50 mg total) by mouth every 6 (six) hours as needed.   Trelegy Ellipta 100-62.5-25 MCG/INH Aepb Generic drug: Fluticasone-Umeclidin-Vilant Inhale into the lungs. Inhale 1 inhalation into the lungs once daily               Durable Medical Equipment  (From admission, onward)           Start     Ordered   07/14/21 1006  For home use only DME oxygen  Once       Question Answer Comment  Length of Need 6 Months   Mode or (Route) Nasal cannula   Liters per Minute 2   Oxygen delivery system Gas      07/14/21 1005            DG Chest 1 View  Result Date: 07/12/2021 CLINICAL DATA:  Status post right thoracentesis EXAM: CHEST  1 VIEW COMPARISON:  07/11/2021 FINDINGS: Improvement in the right effusion following  thoracentesis. Trace effusions remain bilaterally. No pneumothorax. Stable heart size. Similar diffuse interstitial opacities suggesting edema and persistent bibasilar atelectasis. Trachea midline. Aorta atherosclerotic. Degenerative changes of the spine. IMPRESSION: Negative for pneumothorax following right thoracentesis. Stable exam. Electronically Signed   By: Jerilynn Mages.  Shick M.D.   On: 07/12/2021 13:21   DG Chest 2 View  Result Date: 07/11/2021 CLINICAL DATA:  Worsening shortness of breath. EXAM: CHEST - 2 VIEW COMPARISON:  None. FINDINGS: Mild to moderate severity diffusely increased interstitial lung markings are seen. Mild to moderate severity areas of bibasilar atelectasis and/or early infiltrate are also present. There are small bilateral pleural effusions. No pneumothorax is identified. The heart size and mediastinal contours are within normal limits. There is moderate severity calcification of the aortic arch. The visualized skeletal structures are unremarkable. IMPRESSION: 1. Mild to moderate severity interstitial edema with bibasilar atelectasis and/or early infiltrate. 2.  Small bilateral pleural effusions. Electronically Signed   By: Virgina Norfolk M.D.   On: 07/11/2021 23:03   CT Angio Chest PE W/Cm &/Or Wo Cm  Result Date: 07/12/2021 CLINICAL DATA:  Shortness of breath EXAM: CT ANGIOGRAPHY CHEST WITH CONTRAST TECHNIQUE: Multidetector CT imaging of the chest was performed using the standard protocol during bolus administration of intravenous contrast. Multiplanar CT image reconstructions and MIPs were obtained to evaluate the vascular anatomy. CONTRAST:  79m OMNIPAQUE IOHEXOL 350 MG/ML SOLN COMPARISON:  None. FINDINGS: Cardiovascular: Contrast injection is sufficient to demonstrate satisfactory opacification of the pulmonary arteries to the segmental level. There is no pulmonary embolus or evidence of right heart strain. The size of the main pulmonary artery is normal. Heart size is normal,  with no pericardial effusion. The course and caliber of the aorta are normal. There is atherosclerotic calcification. No acute aortic syndrome. Mediastinum/Nodes: There is mediastinal and hilar lymphadenopathy that is unchanged. Right hilar nodes cause marked narrowing of right middle lobe arterial branches. No axillary adenopathy is visible. Soft tissue focus in the outer left breast is unchanged. Lungs/Pleura: Large pleural effusions. Airways are patent. There is mild-to-moderate interstitial pulmonary edema. Upper Abdomen: Contrast bolus timing is not optimized for evaluation of the abdominal organs. Incompletely visualized left adrenal nodule measures at least 11 mm. Musculoskeletal: No chest wall abnormality. No bony spinal canal stenosis. Review of the MIP images confirms the above findings. IMPRESSION: 1. No pulmonary embolus or acute aortic syndrome. 2. Large pleural effusions and mild-to-moderate interstitial pulmonary edema. 3. Bulky mediastinal and hilar lymphadenopathy, unchanged. 4. Incompletely visualized left adrenal nodule. Electronically Signed   By: KUlyses JarredM.D.   On: 07/12/2021 00:28   MR Lumbar Spine W Wo Contrast  Addendum Date: 06/27/2021   ADDENDUM REPORT: 06/27/2021 11:15 ADDENDUM: Study discussed by telephone with Dr. MSara Chu(covering for Dr. BGayland Curry on 06/27/2021 at 11:09 . Electronically Signed   By: HGenevie AnnM.D.   On: 06/27/2021 11:15   Result Date: 06/27/2021 CLINICAL DATA:  70year old female with symptoms beginning in February, numbness in the central low back. Progressive right greater than left low back pain and right leg pain. No known injury. EXAM: MRI LUMBAR SPINE WITHOUT AND WITH CONTRAST TECHNIQUE: Multiplanar and multiecho pulse sequences of the lumbar spine were obtained without and with intravenous contrast. CONTRAST:  841mGADAVIST GADOBUTROL 1 MMOL/ML IV SOLN COMPARISON:  Lumbar radiographs 02/06/2021. FINDINGS: Segmentation:  Normal on the May  radiographs. Alignment: Lumbar lordosis not significantly changed. Occasional mild degenerative spondylolisthesis. Vertebrae: Subtotal tumor replacement of the visible sacrum and pelvis (series 9, image 33) with enhancing tumor filling the sacral spinal canal and bilateral S1 through visible S3 neural foramina. Early circumferential extraosseous extension of tumor from the involved sacral and pelvic bones. Total tumor replacement also of the L5 vertebra, with bulky epidural extension, early extension into the bilateral L5 neural foramina. Subtotal tumor replacement of the L4 vertebra. Ventral epidural tumor and early extension into the right L4 neural foramen. Multifocal tumor metastases in the L3 vertebral body, most notably posteriorly and on the right. Separate bulky tumor of the L3 spinous process with early extraosseous extension. No significant epidural tumor here. Similar patchy metastatic tumor in the central and right L2 body. No epidural tumor. Small round 8 mm tumor metastasis in the left L1 body. Larger and more confluent roughly 18 mm tumor in the right T12 inferolateral endplate. Smaller separate round 5 mm tumor metastasis in the anterior T12 body. The  visible T11 level is spared. Conus medullaris and cauda equina: Conus extends to the L1-L2 level. No lower spinal cord or conus signal abnormality. No abnormal intradural enhancement is identified. But there is bulky epidural and/or dural tumor beginning at the L4 level and obliterating the spinal canal by S1. Paraspinal and other soft tissues: Early paraspinal extension of tumor from the involved segments as detailed above. In the visible abdomen and pelvis no primary tumor is identified. There is extensive diverticulosis of the distal colon. Disc levels: Superimposed lumbar degenerative changes L2-L3 through L4-L5, including up to moderate degenerative L2 and L4 neural foraminal stenosis, and up to severe degenerative left L3-L4 neural foramen and  lateral recess stenosis related to left foraminal and caudal disc herniation seen on series 5, image 10 and series 8, image 23 (severe left L3 and L4 nerve involvement). IMPRESSION: 1. 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 Additional metastatic involvement T12, L1 through L3. No primary tumor site identified. Top differential considerations are Metastatic Disease Unknown primary, less likely Lymphoma or Multiple Myeloma. 2. Superimposed lumbar spine degeneration, including degenerative moderate to severe left L3 and L4 nerve level impingement from disc herniation. Electronically Signed: By: Genevie Ann M.D. On: 06/27/2021 10:46   CT CHEST ABDOMEN PELVIS W CONTRAST  Result Date: 07/02/2021 CLINICAL DATA:  Remote history of breast cancer (2010). New lumbosacral bone lesions found on MRI lumbar spine 06/25/2021 EXAM: CT CHEST, ABDOMEN, AND PELVIS WITH CONTRAST TECHNIQUE: Multidetector CT imaging of the chest, abdomen and pelvis was performed following the standard protocol during bolus administration of intravenous contrast. CONTRAST:  73m OMNIPAQUE IOHEXOL 350 MG/ML SOLN COMPARISON:  Lumbar spine MRI from 06/25/2021 FINDINGS: CT CHEST FINDINGS Cardiovascular: The heart is normal in size. No pericardial effusion. The aorta is normal in caliber. No dissection. Moderate atherosclerotic calcifications. Scattered coronary artery calcifications. Mediastinum/Nodes: Mediastinal and hilar lymphadenopathy consistent with metastatic disease. Pretracheal lymph node on image 21/2 measures 20 mm Prevascular node on image 21/2 measures 12 mm. Right hilar node on image 25/2 measures 19 mm. Subcarinal node on image 28/2 measures 14 mm The esophagus is grossly normal. Lungs/Pleura:  Innumerable small pulmonary and pleural nodules involving both lungs consistent with diffuse metastatic disease. There are associated bilateral pleural effusions likely malignant. Musculoskeletal: No breast masses, supraclavicular or axillary adenopathy. The thyroid gland is unremarkable. Mixed lytic and sclerotic metastatic bone disease most notably affecting T3 and T7. Mild pathologic compression fracture of T7 canal compromise. There is also a small lytic lesion involving T12. Suspect a mildly sclerotic lesion involving the manubrium of the sternum. Subtle mixed lytic and sclerotic lesion involving the upper sternum with some erosion of the posterior cortex. CT ABDOMEN PELVIS FINDINGS Hepatobiliary: There are 5 vague low-attenuation hepatic lesions consistent with metastatic disease. 17 mm lesion in segment 8, 11.5 mm lesion in segment 3, 10 mm lesion in segment 4B, 15 mm lesion lower in segment 4B and 15 mm lesion in segment 6. The gallbladder is unremarkable.  No common bile duct dilatation. Pancreas: No mass, inflammation or ductal dilatation. Spleen: Normal size.  No focal lesions. Adrenals/Urinary Tract: Bilateral adrenal gland lesions likely metastatic disease. The largest lesion measures 13 mm in the left gland. The kidneys are unremarkable. No renal lesions or hydronephrosis. The bladder is unremarkable. Stomach/Bowel: The  stomach, duodenum, small bowel and colon are grossly normal. Vascular/Lymphatic: Moderate atherosclerotic calcifications involving the aorta and branch vessels but no aneurysm or dissection. No mesenteric or retroperitoneal mass or adenopathy. Reproductive: The uterus and ovaries are unremarkable. Other: No pelvic mass or adenopathy. No free pelvic fluid collections. No inguinal mass or adenopathy. No abdominal wall hernia or subcutaneous lesions. Musculoskeletal: Severe destructive metastatic bone disease involving the lower lumbar vertebral bodies, the sacrum and iliac bones. The  sacrum is largely destroyed. No definite involvement the hips. IMPRESSION: 1. Innumerable small pulmonary and pleural nodules consistent with diffuse metastatic disease. Associated probable malignant pleural effusions. 2. Mediastinal and hilar lymphadenopathy consistent with metastatic disease. 3. Hepatic and bilateral adrenal gland metastasis. 4. Severe, diffuse and extensive, destructive metastatic bone disease involving the pelvis. Other metastatic bone lesions as detailed above. Aortic Atherosclerosis (ICD10-I70.0). Electronically Signed   By: Marijo Sanes M.D.   On: 07/02/2021 18:38   ECHOCARDIOGRAM COMPLETE  Result Date: 07/13/2021    ECHOCARDIOGRAM REPORT   Patient Name:   LUTHER NEWHOUSE Date of Exam: 07/13/2021 Medical Rec #:  [id number]          Height:       65.0 in Accession #:    [id number]         Weight:       182.1 lb Date of Birth:  1951/07/12          BSA:          1.901 m Patient Age:    89 years           BP:           151/79 mmHg Patient Gender: F                  HR:           100 bpm. Exam Location:  [facility] Procedure: 2D Echo, Cardiac Doppler and Color Doppler Indications:     SOB (shortness of breath) [[id number]]  History:         Patient has no prior history of Echocardiogram examinations.                  Risk Factors:Hypertension.  Sonographer:     Alyse Low Roar Referring Phys:  [place] Diagnosing Phys: Ida Rogue MD IMPRESSIONS  1. Left ventricular ejection fraction, by estimation, is 60 to 65%. The left ventricle has normal function. The left ventricle has no regional wall motion abnormalities. There is mild left ventricular hypertrophy. Left ventricular diastolic parameters are consistent with Grade I diastolic dysfunction (impaired relaxation).  2. Right ventricular systolic function is normal. The right ventricular size is normal. Tricuspid regurgitation signal is inadequate for assessing PA pressure.  3. The mitral valve is normal in structure. No evidence of  mitral valve regurgitation. No evidence of mitral stenosis. FINDINGS  Left Ventricle: Left ventricular ejection fraction, by estimation, is 60 to 65%. The left ventricle has normal function. The left ventricle has no regional wall motion abnormalities. The left ventricular internal cavity size was normal in size. There is  mild left ventricular hypertrophy. Left ventricular diastolic parameters are consistent with Grade I diastolic dysfunction (impaired relaxation). Right Ventricle: The right ventricular size is normal. No increase in right ventricular wall thickness. Right ventricular systolic function is normal. Tricuspid regurgitation signal is inadequate for assessing PA pressure. Left Atrium: Left atrial size was normal in size. Right Atrium: Right atrial size was normal in size. Pericardium: There is no  evidence of pericardial effusion. Mitral Valve: The mitral valve is normal in structure. No evidence of mitral valve regurgitation. No evidence of mitral valve stenosis. Tricuspid Valve: The tricuspid valve is normal in structure. Tricuspid valve regurgitation is not demonstrated. No evidence of tricuspid stenosis. Aortic Valve: The aortic valve was not well visualized. Aortic valve regurgitation is not visualized. No aortic stenosis is present. Aortic valve peak gradient measures 9.4 mmHg. Pulmonic Valve: The pulmonic valve was normal in structure. Pulmonic valve regurgitation is not visualized. No evidence of pulmonic stenosis. Aorta: The aortic root is normal in size and structure. Venous: The inferior vena cava is normal in size with greater than 50% respiratory variability, suggesting right atrial pressure of 3 mmHg. IAS/Shunts: No atrial level shunt detected by color flow Doppler.  LEFT VENTRICLE PLAX 2D LVIDd:         3.91 cm   Diastology LVIDs:         2.70 cm   LV e' medial:    4.24 cm/s LV PW:         1.16 cm   LV E/e' medial:  17.9 LV IVS:        1.31 cm   LV e' lateral:   5.87 cm/s LVOT diam:      1.80 cm   LV E/e' lateral: 12.9 LVOT Area:     2.54 cm  RIGHT VENTRICLE RV Basal diam:  2.51 cm RV Mid diam:    2.14 cm RV S prime:     12.10 cm/s TAPSE (M-mode): 2.2 cm LEFT ATRIUM             Index        RIGHT ATRIUM          Index LA diam:        3.40 cm 1.79 cm/m   RA Area:     9.27 cm LA Vol (A2C):   45.1 ml 23.73 ml/m  RA Volume:   15.90 ml 8.36 ml/m LA Vol (A4C):   33.1 ml 17.41 ml/m LA Biplane Vol: 40.3 ml 21.20 ml/m  AORTIC VALVE                 PULMONIC VALVE AV Area (Vmax): 1.73 cm     PV Vmax:          1.02 m/s AV Vmax:        153.00 cm/s  PV Peak grad:     4.2 mmHg AV Peak Grad:   9.4 mmHg     PR End Diast Vel: 7.95 msec LVOT Vmax:      104.00 cm/s  RVOT Peak grad:   2 mmHg  AORTA Ao Root diam: 2.60 cm MITRAL VALVE MV Area (PHT): 4.41 cm     SHUNTS MV Decel Time: 172 msec     Systemic Diam: 1.80 cm MV E velocity: 75.80 cm/s MV A velocity: 105.00 cm/s MV E/A ratio:  0.72 MV A Prime:    12.7 cm/s Ida Rogue MD Electronically signed by Ida Rogue MD Signature Date/Time: 07/13/2021/1:49:22 PM    Final    US THORACENTESIS ASP PLEURAL SPACE W/IMG GUIDE  Result Date: 07/12/2021 INDICATION: Patient with history of metastatic breast cancer, dyspnea, bilateral pleural effusions; request received for diagnostic and therapeutic right thoracentesis. EXAM: ULTRASOUND GUIDED DIAGNOSTIC AND THERAPEUTIC RIGHT THORACENTESIS MEDICATIONS: 10 mL 1% lidocaine COMPLICATIONS: None immediate. PROCEDURE: An ultrasound guided thoracentesis was thoroughly discussed with the patient and questions answered. The benefits, risks, alternatives and complications were also discussed.  The patient understands and wishes to proceed with the procedure. Written consent was obtained. Ultrasound was performed to localize and mark an adequate pocket of fluid in the right chest. The area was then prepped and draped in the normal sterile fashion. 1% Lidocaine was used for local anesthesia. Under ultrasound guidance a 6 Fr  Safe-T-Centesis catheter was introduced. Thoracentesis was performed. The catheter was removed and a dressing applied. FINDINGS: A total of approximately 750 cc of yellow fluid was removed. Samples were sent to the laboratory as requested by the clinical team. IMPRESSION: Successful ultrasound guided diagnostic and therapeutic right thoracentesis yielding 750 cc of pleural fluid. Read by: Rowe Robert, PA-C Electronically Signed   By: Jerilynn Mages.  Shick M.D.   On: 07/12/2021 12:40   Results for orders placed or performed during the hospital encounter of 07/11/21  Culture, blood (routine x 2)     Status: None (Preliminary result)   Collection Time: 07/12/21 12:44 AM   Specimen: BLOOD  Result Value Ref Range Status   Specimen Description BLOOD RIGHT WRIST  Final   Special Requests   Final    BOTTLES DRAWN AEROBIC AND ANAEROBIC Blood Culture adequate volume   Culture   Final    NO GROWTH 2 DAYS Performed at [facility], [address], [place], [place] [address]    Report Status PENDING  Incomplete  Culture, blood (routine x 2)     Status: None (Preliminary result)   Collection Time: 07/12/21 12:45 AM   Specimen: BLOOD  Result Value Ref Range Status   Specimen Description BLOOD RIGHT WRIST  Final   Special Requests   Final    BOTTLES DRAWN AEROBIC AND ANAEROBIC Blood Culture adequate volume   Culture   Final    NO GROWTH 2 DAYS Performed at [facility], [address], [place], [place] [address]    Report Status PENDING  Incomplete  Resp Panel by RT-PCR (Flu A&B, Covid) Nasopharyngeal Swab     Status: None   Collection Time: 07/12/21  1:32 AM   Specimen: Nasopharyngeal Swab; Nasopharyngeal(NP) swabs in vial transport medium  Result Value Ref Range Status   SARS Coronavirus 2 by RT PCR NEGATIVE NEGATIVE Final    Comment: (NOTE) SARS-CoV-2 target nucleic acids are NOT DETECTED.  The SARS-CoV-2 RNA is generally detectable in upper respiratory specimens during the acute phase  of infection. The lowest concentration of SARS-CoV-2 viral copies this assay can detect is 138 copies/mL. A negative result does not preclude SARS-Cov-2 infection and should not be used as the sole basis for treatment or other patient management decisions. A negative result may occur with  improper specimen collection/handling, submission of specimen other than nasopharyngeal swab, presence of viral mutation(s) within the areas targeted by this assay, and inadequate number of viral copies(<138 copies/mL). A negative result must be combined with clinical observations, patient history, and epidemiological information. The expected result is Negative.  Fact Sheet for Patients:  EntrepreneurPulse.com.au  Fact Sheet for Healthcare Providers:  IncredibleEmployment.be  This test is no t yet approved or cleared by the [place] FDA and  has been authorized for detection and/or diagnosis of SARS-CoV-2 by FDA under an Emergency Use Authorization (EUA). This EUA will remain  in effect (meaning this test can be used) for the duration of the COVID-19 declaration under Section 564(b)(1) of the Act, 21 U.S.C.section 360bbb-3(b)(1), unless the authorization is terminated  or revoked sooner.       Influenza A by PCR NEGATIVE NEGATIVE  Final   Influenza B by PCR NEGATIVE NEGATIVE Final    Comment: (NOTE) The Xpert Xpress SARS-CoV-2/FLU/RSV plus assay is intended as an aid in the diagnosis of influenza from Nasopharyngeal swab specimens and should not be used as a sole basis for treatment. Nasal washings and aspirates are unacceptable for Xpert Xpress SARS-CoV-2/FLU/RSV testing.  Fact Sheet for Patients: EntrepreneurPulse.com.au  Fact Sheet for Healthcare Providers: IncredibleEmployment.be  This test is not yet approved or cleared by the [place] FDA and has been authorized for detection and/or diagnosis of  SARS-CoV-2 by FDA under an Emergency Use Authorization (EUA). This EUA will remain in effect (meaning this test can be used) for the duration of the COVID-19 declaration under Section 564(b)(1) of the Act, 21 U.S.C. section 360bbb-3(b)(1), unless the authorization is terminated or revoked.  Performed at [facility], [place], [place] [address]   Body fluid culture w Gram Stain     Status: None (Preliminary result)   Collection Time: 07/12/21 12:20 PM   Specimen: PATH Cytology Pleural fluid  Result Value Ref Range Status   Specimen Description PLEURAL FLUID  Final   Special Requests NONE  Final   Gram Stain   Final    FEW WBC PRESENT, PREDOMINANTLY MONONUCLEAR NO ORGANISMS SEEN    Culture   Final    NO GROWTH 2 DAYS Performed at [facility], [place] [address] [place], [place] [address]    Report Status PENDING  Incomplete    Signed:  Murray Hodgkins MD.  [facility] 07/14/2021, 3:31 PM

## 2021-07-14 NOTE — Progress Notes (Signed)
SATURATION QUALIFICATIONS: (This note is used to comply with regulatory documentation for home oxygen)  Patient Saturations on Room Air at Rest = 92%  Patient Saturations on Room Air while Ambulating = 89%  Patient Saturations on 1 Liters of oxygen while Ambulating = 94%  Please briefly explain why patient needs home oxygen: Pt SOB when with exertion, and desaturates to <92%

## 2021-07-14 NOTE — Progress Notes (Signed)
Pt seen in [place] for bilateral pleural effusions.  Had right thoracentesis yielding 700cc fluid on 10/15 and she returned to [place] today to have a left sided thoracentesis.  Imaging revealed small amount of fluid bilaterally.  Discussed with patient who says she is feeling much better than previous and is not becoming SOB except with exertion and believes she recovers relatively quickly.  Says when she exerts while wearing 1L  O2 the SOB is difficult to notice.  After discussion and imaging, it was determined the benefits at this time did not outweigh the risks and Amber Blevins decided not to proceed with Left sided thoracentesis.     Electronically Signed: Pasty Spillers 07/14/2021, 12:14 PM

## 2021-07-15 DIAGNOSIS — E7800 Pure hypercholesterolemia, unspecified: Secondary | ICD-10-CM | POA: Diagnosis not present

## 2021-07-15 DIAGNOSIS — M5126 Other intervertebral disc displacement, lumbar region: Secondary | ICD-10-CM | POA: Diagnosis not present

## 2021-07-15 DIAGNOSIS — Z510 Encounter for antineoplastic radiation therapy: Secondary | ICD-10-CM | POA: Diagnosis present

## 2021-07-15 DIAGNOSIS — C7951 Secondary malignant neoplasm of bone: Secondary | ICD-10-CM | POA: Diagnosis present

## 2021-07-15 DIAGNOSIS — Z853 Personal history of malignant neoplasm of breast: Secondary | ICD-10-CM | POA: Diagnosis not present

## 2021-07-15 DIAGNOSIS — C7800 Secondary malignant neoplasm of unspecified lung: Secondary | ICD-10-CM | POA: Diagnosis not present

## 2021-07-15 DIAGNOSIS — C787 Secondary malignant neoplasm of liver and intrahepatic bile duct: Secondary | ICD-10-CM | POA: Diagnosis not present

## 2021-07-15 DIAGNOSIS — C773 Secondary and unspecified malignant neoplasm of axilla and upper limb lymph nodes: Secondary | ICD-10-CM | POA: Diagnosis not present

## 2021-07-15 DIAGNOSIS — C50919 Malignant neoplasm of unspecified site of unspecified female breast: Secondary | ICD-10-CM | POA: Diagnosis not present

## 2021-07-15 DIAGNOSIS — Z923 Personal history of irradiation: Secondary | ICD-10-CM | POA: Diagnosis not present

## 2021-07-15 DIAGNOSIS — G893 Neoplasm related pain (acute) (chronic): Secondary | ICD-10-CM | POA: Diagnosis not present

## 2021-07-15 DIAGNOSIS — I10 Essential (primary) hypertension: Secondary | ICD-10-CM | POA: Diagnosis not present

## 2021-07-15 DIAGNOSIS — C7970 Secondary malignant neoplasm of unspecified adrenal gland: Secondary | ICD-10-CM | POA: Diagnosis not present

## 2021-07-15 DIAGNOSIS — Z79899 Other long term (current) drug therapy: Secondary | ICD-10-CM | POA: Diagnosis not present

## 2021-07-15 DIAGNOSIS — Z87891 Personal history of nicotine dependence: Secondary | ICD-10-CM | POA: Diagnosis not present

## 2021-07-16 ENCOUNTER — Other Ambulatory Visit: Payer: Self-pay

## 2021-07-16 ENCOUNTER — Other Ambulatory Visit: Payer: Self-pay | Admitting: Oncology

## 2021-07-16 ENCOUNTER — Ambulatory Visit
Admission: RE | Admit: 2021-07-16 | Discharge: 2021-07-16 | Disposition: A | Discharge status: Home / Self Care | Payer: Medicare PPO | Source: Ambulatory Visit | Attending: Neurosurgery | Admitting: Neurosurgery

## 2021-07-16 ENCOUNTER — Telehealth: Payer: Self-pay | Admitting: Oncology

## 2021-07-16 DIAGNOSIS — C7951 Secondary malignant neoplasm of bone: Secondary | ICD-10-CM | POA: Insufficient documentation

## 2021-07-16 DIAGNOSIS — Z888 Allergy status to other drugs, medicaments and biological substances status: Secondary | ICD-10-CM | POA: Diagnosis not present

## 2021-07-16 DIAGNOSIS — Z853 Personal history of malignant neoplasm of breast: Secondary | ICD-10-CM | POA: Insufficient documentation

## 2021-07-16 DIAGNOSIS — J9601 Acute respiratory failure with hypoxia: Secondary | ICD-10-CM

## 2021-07-16 DIAGNOSIS — Z87891 Personal history of nicotine dependence: Secondary | ICD-10-CM | POA: Diagnosis not present

## 2021-07-16 DIAGNOSIS — Z8249 Family history of ischemic heart disease and other diseases of the circulatory system: Secondary | ICD-10-CM | POA: Insufficient documentation

## 2021-07-16 DIAGNOSIS — I10 Essential (primary) hypertension: Secondary | ICD-10-CM | POA: Diagnosis not present

## 2021-07-16 DIAGNOSIS — Z7982 Long term (current) use of aspirin: Secondary | ICD-10-CM | POA: Insufficient documentation

## 2021-07-16 DIAGNOSIS — Z79899 Other long term (current) drug therapy: Secondary | ICD-10-CM | POA: Diagnosis not present

## 2021-07-16 DIAGNOSIS — M899 Disorder of bone, unspecified: Secondary | ICD-10-CM | POA: Diagnosis present

## 2021-07-16 LAB — BODY FLUID CULTURE W GRAM STAIN: Culture: NO GROWTH

## 2021-07-16 IMAGING — CT CT BIOPSY CORE BONE DEEP
2 series · 10 of 14 positions shown, 12 images · non-contrast
Comparison: none

INDICATION: History of breast cancer, now with extensive osseous metastatic
disease worrisome for metastatic breast cancer. Please perform
CT-guided biopsy for tissue diagnostic purposes.

[Series 2: i-spiral 5.0 b30f · axial · 0.73mm/px · z∈[-127,-60]mm · 5 of 29 slices shown, 7 images]
[im 5/29  soft-tissue]
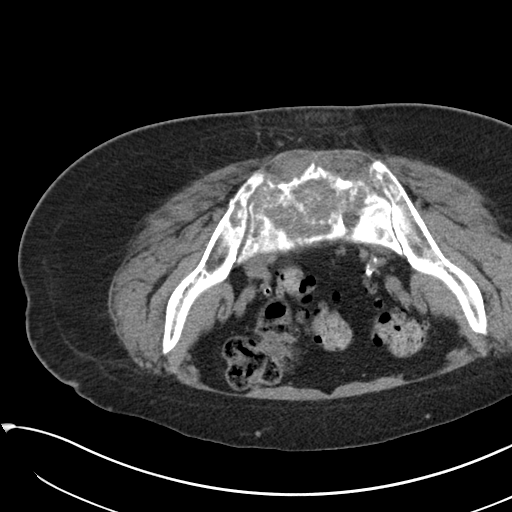
[im 5/29  bone]
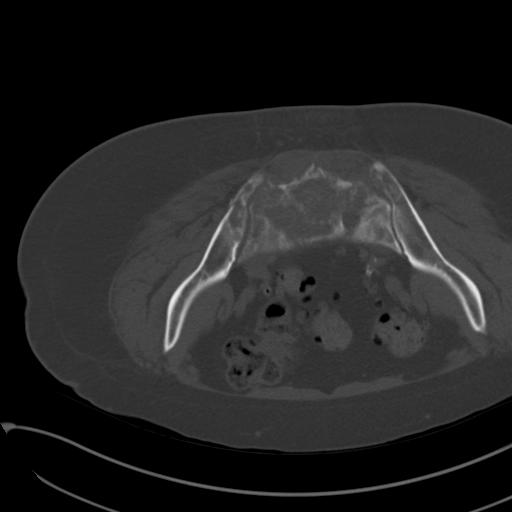
[im 10/29  bone]
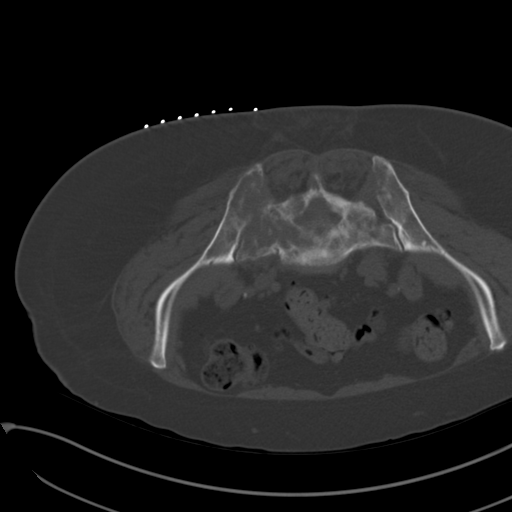
[im 15/29  bone]
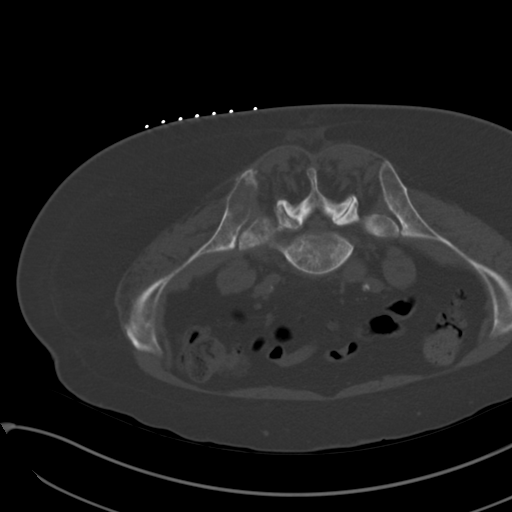
[im 19/29  bone]
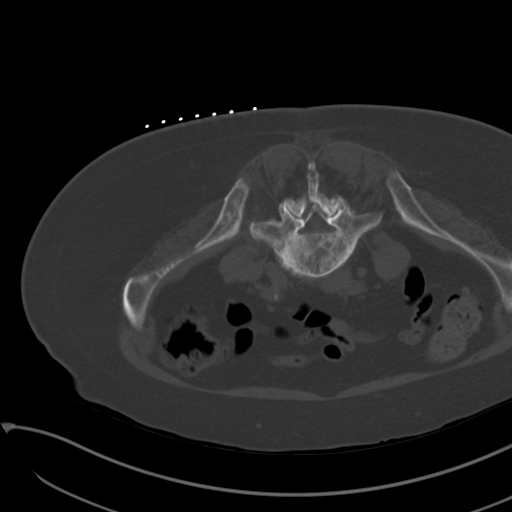
[im 24/29  soft-tissue]
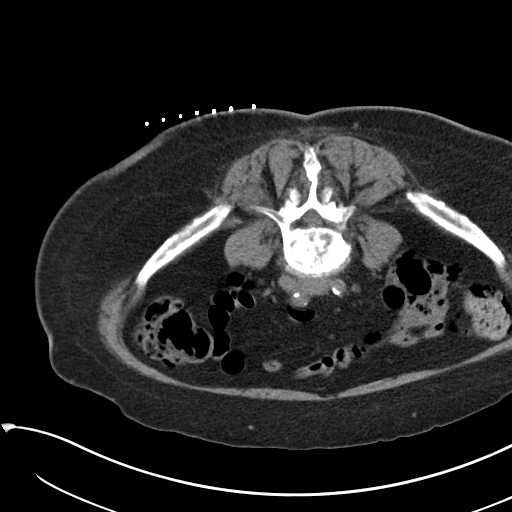
[im 24/29  bone]
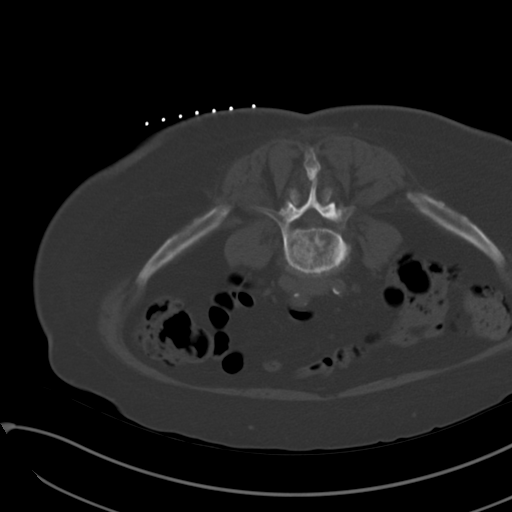

[Series 3: i-sequence 4.8 b30s · axial · 0.73mm/px · z∈[-109,-99]mm · 5 of 30 slices shown]
[im 5/30  bone]
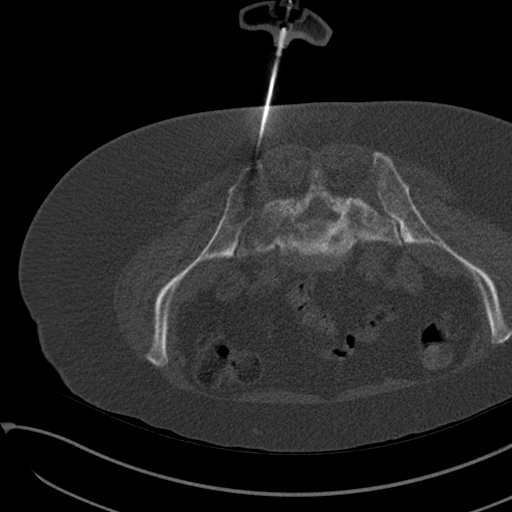
[im 10/30  bone]
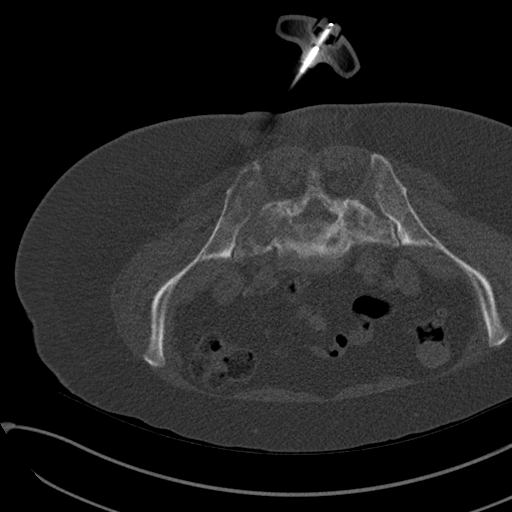
[im 15/30  bone]
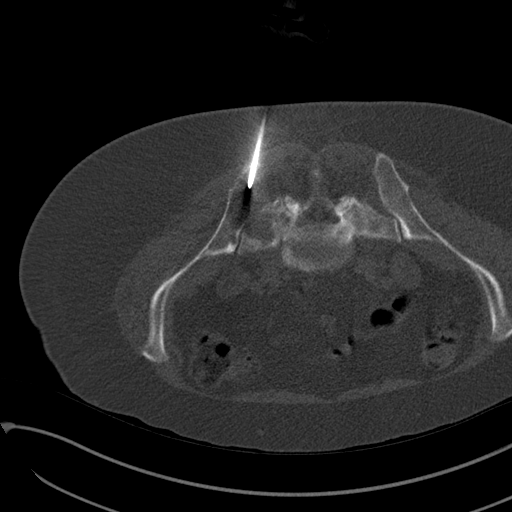
[im 20/30  bone]
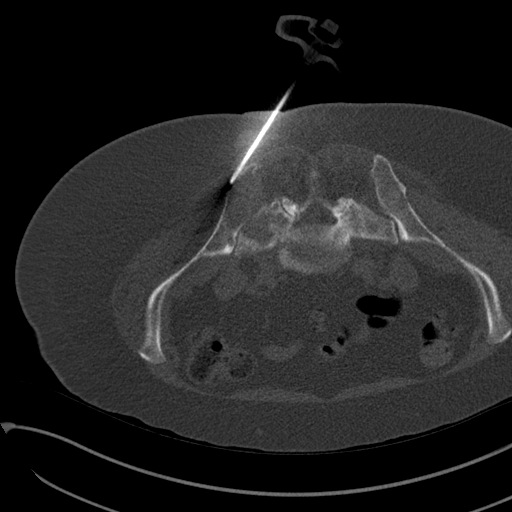
[im 25/30  bone]
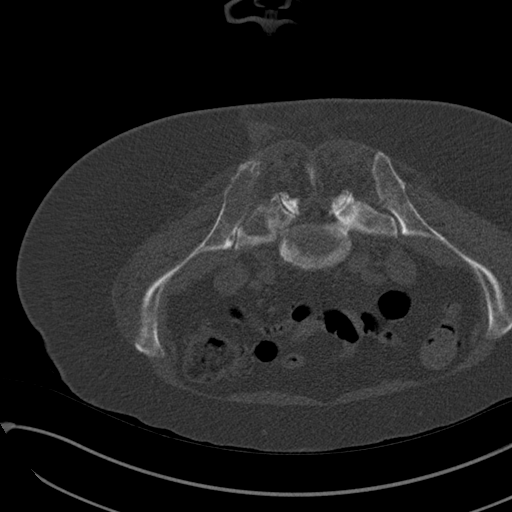

[10 of 14 positions shown; findings below may reference images not displayed]

EXAM:
CT-GUIDED BONE LESION BIOPSY

MEDICATIONS:
None

ANESTHESIA/SEDATION:
Fentanyl 100 mcg IV; Versed 2 mg IV

Sedation Time: 16 Minutes; The patient was continuously monitored
during the procedure by the interventional radiology nurse under my
direct supervision.

COMPLICATIONS:
None immediate.

PROCEDURE:
Informed consent was obtained from the patient following an
explanation of the procedure, risks, benefits and alternatives. The
patient understands, agrees and consents for the procedure. All
questions were addressed. A time out was performed prior to the
initiation of the procedure.

The patient was positioned prone and non-contrast localization CT
was performed of the pelvis redemonstrated the infiltrative lesion
involving the posterior aspect of the right ilium. The operative
site was prepped and draped in the usual sterile fashion.

Under sterile conditions and local anesthesia, a 22 gauge spinal
needle was utilized for procedural planning. Next, an 11 gauge
coaxial bone biopsy needle was advanced into the infiltrative lesion
involving the posterior aspect the right ilium. Needle position was
confirmed with CT imaging (image 8, series 3). Next, the inner 13
gauge bone biopsy device was utilized to acquire sample
(representative image 10, series 3). Next, the outer 11 gauge biopsy
device was utilized to acquire 4 additional samples all under
intermittent CT guidance.

The needle was removed and superficial hemostasis was obtained with
manual compression. A dressing was applied. The patient tolerated
the procedure well without immediate post procedural complication.
IMPRESSION: Successful CT guided biopsy of lytic lesion involving the posterior
aspect of the right ilium.

## 2021-07-16 MED ORDER — FENTANYL CITRATE (PF) 100 MCG/2ML IJ SOLN
INTRAMUSCULAR | Status: AC
  Filled 2021-07-16: qty 2

## 2021-07-16 MED ORDER — MIDAZOLAM HCL 2 MG/2ML IJ SOLN
INTRAMUSCULAR | Status: AC
  Filled 2021-07-16: qty 2

## 2021-07-16 MED ORDER — HEPARIN SOD (PORK) LOCK FLUSH 100 UNIT/ML IV SOLN
INTRAVENOUS | Status: AC
  Filled 2021-07-16: qty 5

## 2021-07-16 MED ORDER — DEXAMETHASONE 4 MG PO TABS: 4.0000 mg | ORAL_TABLET | Freq: Two times a day (BID) | ORAL | 0 refills | Status: DC

## 2021-07-16 MED ORDER — SODIUM CHLORIDE 0.9 % IV SOLN: INTRAVENOUS | Status: DC

## 2021-07-16 MED ORDER — FENTANYL CITRATE (PF) 100 MCG/2ML IJ SOLN
INTRAMUSCULAR | Status: DC | PRN
  Administered 2021-07-16: 25 ug via INTRAVENOUS
  Administered 2021-07-16: 50 ug via INTRAVENOUS
  Administered 2021-07-16: 25 ug via INTRAVENOUS

## 2021-07-16 MED ORDER — MIDAZOLAM HCL 2 MG/2ML IJ SOLN
INTRAMUSCULAR | Status: DC | PRN
  Administered 2021-07-16 (×2): .5 mg via INTRAVENOUS
  Administered 2021-07-16: 1 mg via INTRAVENOUS

## 2021-07-16 NOTE — Telephone Encounter (Addendum)
Patient was seen at [facility] for Biopsy. The nurse informed us of the following:    I just wanted to give you heads up. this patient came for a bone biopsy. on arrival she walked a short distance 5 steps and she was very short of breath saturations 88% (after few minutes lying down). I placed her on xygen and her sats wentup to 97%. post procedrue she is on room air and hovering between 88-90% but she also is still lying in bed.   *will put order in for home use DME oxygen*  SATURATION QUALIFICATIONS: (This note is used to comply with regulatory documentation for home oxygen)  Patient Saturations on Room Air at Rest =88 %  Patient Saturations on Room Air while Ambulating = %  Patient Saturations on  Liters of oxygen while Ambulating = 2L went to 97%  Please briefly explain why patient needs home oxygen:

## 2021-07-16 NOTE — Discharge Instructions (Signed)

## 2021-07-16 NOTE — Procedures (Signed)
Pre procedural Dx: Lytic lesion involving the right ilium Post procedural Dx: Same  Technically successful CT guided biopsy of lytic lesion involving the right ilium.   EBL: None.  Complications: None immediate.   Ronny Bacon, MD Pager #: [phone]

## 2021-07-16 NOTE — Consult Note (Signed)
Chief Complaint: Concern for metastatic breast cancer  Referring Physician(s): Yarbrough,Chester  Patient Status: [facility] - Out-pt  History of Present Illness: Amber Blevins is a 70 y.o. female with past medical history significant for hypertension, hyperlipidemia and breast cancer who presents today for CT-guided biopsy of lytic lesion involving the right hemipelvis worrisome for metastatic breast cancer.  Patient is accompanied by her husband though serves as her own historian  Patient was admitted to the hospital last week with shortness of breath for which she underwent a thoracentesis.  Her shortness of breath is improved though not completely resolved.  Patient is otherwise without complaint.  Specifically, no chest pain, fever or chills.  Past Medical History:  Diagnosis Date   Breast cancer ([place])    High cholesterol    Hypertension    Personal history of radiation therapy     Past Surgical History:  Procedure Laterality Date   BREAST LUMPECTOMY Left    2010   thorocentesis Right     Allergies: Green tea (camellia sinensis), Melaleuca viridiflora, and Lisinopril  Medications: Prior to Admission medications   Medication Sig Start Date End Date Taking? Authorizing Provider  acetaminophen (TYLENOL) 650 MG CR tablet Take 650 mg by mouth every 8 (eight) hours as needed for pain.   Yes [provider]  albuterol (VENTOLIN HFA) 108 (90 Base) MCG/ACT inhaler Inhale 2 puffs into the lungs every 6 (six) hours as needed. 06/23/21 06/23/22 Yes [provider]  aspirin 81 MG EC tablet Take by mouth. 04/21/21 04/21/22 Yes [provider]  atorvastatin (LIPITOR) 40 MG tablet Take 40 mg by mouth at bedtime. 11/25/20  Yes [provider]  Boswellia-Glucosamine-Vit D (OSTEO BI-FLEX-GLUCOS/5-LOXIN) TABS Take 1 capsule by mouth in the morning and at bedtime.   Yes [provider]  Calcium Carb-Cholecalciferol 600-400 MG-UNIT TABS Take 1  tablet by mouth daily.   Yes [provider]  Docusate Sodium (DSS) 100 MG CAPS Take 1 capsule by mouth every other day.   Yes [provider]  Fluticasone-Umeclidin-Vilant (TRELEGY ELLIPTA) 100-62.5-25 MCG/INH AEPB Inhale into the lungs. Inhale 1 inhalation into the lungs once daily 06/23/21  Yes [provider]  gabapentin (NEURONTIN) 300 MG capsule Take 2 capsules by mouth in the morning, at noon, and at bedtime. 02/12/21 02/12/22 Yes [provider]  magnesium chloride (SLOW-MAG) 64 MG TBEC SR tablet Take 1 tablet by mouth at bedtime.   Yes [provider]  metoprolol-hydrochlorothiazide (LOPRESSOR HCT) 50-25 MG tablet Take 1 tablet by mouth daily. Take 1 tablet by mouth once daily 04/21/21 04/21/22 Yes [provider]  traMADol (ULTRAM) 50 MG tablet Take 1 tablet (50 mg total) by mouth every 6 (six) hours as needed. 07/02/21  Yes [name], MD  Turmeric (QC TUMERIC COMPLEX PO) Take 1 capsule by mouth at bedtime.   Yes [provider]  ibuprofen (ADVIL) 200 MG tablet Take by mouth. Take 400 mg by mouth every six (6) hours as needed for pain. Patient not taking: Reported on 07/12/2021    [provider]     Family History  Problem Relation Age of Onset   Cancer Mother        gynecological   Lung cancer Mother    Diabetes Father    Heart disease Father    Parkinson's disease Father    Brain cancer Brother    Bladder Cancer Brother    Pulmonary disease Brother    Rheumatic fever Brother    Breast  cancer Neg Hx     Social History   Socioeconomic History   Marital status: Married    Spouse name: Not on file   Number of children: Not on file   Years of education: Not on file   Highest education level: Not on file  Occupational History   Not on file  Tobacco Use   Smoking status: Former    Packs/day: 1.00    Years: 25.00    Pack years: 25.00    Types: Cigarettes    Quit date: 59    Years since quitting: 32.8    Smokeless tobacco: Never  Vaping Use   Vaping Use: Never used  Substance and Sexual Activity   Alcohol use: Yes   Drug use: Never   Sexual activity: Not on file  Other Topics Concern   Not on file  Social History Narrative   Not on file   Social Determinants of Health   Financial Resource Strain: Not on file  Food Insecurity: Not on file  Transportation Needs: Not on file  Physical Activity: Not on file  Stress: Not on file  Social Connections: Not on file    ECOG Status: 1 - Symptomatic but completely ambulatory  Review of Systems: A 12 point ROS discussed and pertinent positives are indicated in the HPI above.  All other systems are negative.  Review of Systems  Vital Signs: BP 140/84   Pulse 89   Temp 98.4 F (36.9 C) (Oral)   Resp (!) 23   Ht $R'5\' 5"'qJ$  (1.651 m)   Wt 81.6 kg   SpO2 (S) (!) 88% Comment: room air sat, with ambulation to bathroom. reproting shortness of breath. increased to 97% ib 2 liters  BMI 29.95 kg/m   Physical Exam  Imaging: DG Chest 1 View  Result Date: 07/12/2021 CLINICAL DATA:  Status post right thoracentesis EXAM: CHEST  1 VIEW COMPARISON:  07/11/2021 FINDINGS: Improvement in the right effusion following thoracentesis. Trace effusions remain bilaterally. No pneumothorax. Stable heart size. Similar diffuse interstitial opacities suggesting edema and persistent bibasilar atelectasis. Trachea midline. Aorta atherosclerotic. Degenerative changes of the spine. IMPRESSION: Negative for pneumothorax following right thoracentesis. Stable exam. Electronically Signed   By: Jerilynn Mages.  Shick M.D.   On: 07/12/2021 13:21   DG Chest 2 View  Result Date: 07/11/2021 CLINICAL DATA:  Worsening shortness of breath. EXAM: CHEST - 2 VIEW COMPARISON:  None. FINDINGS: Mild to moderate severity diffusely increased interstitial lung markings are seen. Mild to moderate severity areas of bibasilar atelectasis and/or early infiltrate are also present. There are small  bilateral pleural effusions. No pneumothorax is identified. The heart size and mediastinal contours are within normal limits. There is moderate severity calcification of the aortic arch. The visualized skeletal structures are unremarkable. IMPRESSION: 1. Mild to moderate severity interstitial edema with bibasilar atelectasis and/or early infiltrate. 2. Small bilateral pleural effusions. Electronically Signed   By: Virgina Norfolk M.D.   On: 07/11/2021 23:03   CT Angio Chest PE W/Cm &/Or Wo Cm  Result Date: 07/12/2021 CLINICAL DATA:  Shortness of breath EXAM: CT ANGIOGRAPHY CHEST WITH CONTRAST TECHNIQUE: Multidetector CT imaging of the chest was performed using the standard protocol during bolus administration of intravenous contrast. Multiplanar CT image reconstructions and MIPs were obtained to evaluate the vascular anatomy. CONTRAST:  60mL OMNIPAQUE IOHEXOL 350 MG/ML SOLN COMPARISON:  None. FINDINGS: Cardiovascular: Contrast injection is sufficient to demonstrate satisfactory opacification of the pulmonary arteries to the segmental level. There is no pulmonary embolus or  evidence of right heart strain. The size of the main pulmonary artery is normal. Heart size is normal, with no pericardial effusion. The course and caliber of the aorta are normal. There is atherosclerotic calcification. No acute aortic syndrome. Mediastinum/Nodes: There is mediastinal and hilar lymphadenopathy that is unchanged. Right hilar nodes cause marked narrowing of right middle lobe arterial branches. No axillary adenopathy is visible. Soft tissue focus in the outer left breast is unchanged. Lungs/Pleura: Large pleural effusions. Airways are patent. There is mild-to-moderate interstitial pulmonary edema. Upper Abdomen: Contrast bolus timing is not optimized for evaluation of the abdominal organs. Incompletely visualized left adrenal nodule measures at least 11 mm. Musculoskeletal: No chest wall abnormality. No bony spinal canal  stenosis. Review of the MIP images confirms the above findings. IMPRESSION: 1. No pulmonary embolus or acute aortic syndrome. 2. Large pleural effusions and mild-to-moderate interstitial pulmonary edema. 3. Bulky mediastinal and hilar lymphadenopathy, unchanged. 4. Incompletely visualized left adrenal nodule. Electronically Signed   By: Ulyses Jarred M.D.   On: 07/12/2021 00:28   MR Lumbar Spine W Wo Contrast  Addendum Date: 06/27/2021   ADDENDUM REPORT: 06/27/2021 11:15 ADDENDUM: Study discussed by telephone with Dr. Sara Chu (covering for Dr. Gayland Curry) on 06/27/2021 at 11:09 . Electronically Signed   By: Genevie Ann M.D.   On: 06/27/2021 11:15   Result Date: 06/27/2021 CLINICAL DATA:  70 year old female with symptoms beginning in February, numbness in the central low back. Progressive right greater than left low back pain and right leg pain. No known injury. EXAM: MRI LUMBAR SPINE WITHOUT AND WITH CONTRAST TECHNIQUE: Multiplanar and multiecho pulse sequences of the lumbar spine were obtained without and with intravenous contrast. CONTRAST:  38mL GADAVIST GADOBUTROL 1 MMOL/ML IV SOLN COMPARISON:  Lumbar radiographs 02/06/2021. FINDINGS: Segmentation:  Normal on the May radiographs. Alignment: Lumbar lordosis not significantly changed. Occasional mild degenerative spondylolisthesis. Vertebrae: Subtotal tumor replacement of the visible sacrum and pelvis (series 9, image 33) with enhancing tumor filling the sacral spinal canal and bilateral S1 through visible S3 neural foramina. Early circumferential extraosseous extension of tumor from the involved sacral and pelvic bones. Total tumor replacement also of the L5 vertebra, with bulky epidural extension, early extension into the bilateral L5 neural foramina. Subtotal tumor replacement of the L4 vertebra. Ventral epidural tumor and early extension into the right L4 neural foramen. Multifocal tumor metastases in the L3 vertebral body, most notably posteriorly  and on the right. Separate bulky tumor of the L3 spinous process with early extraosseous extension. No significant epidural tumor here. Similar patchy metastatic tumor in the central and right L2 body. No epidural tumor. Small round 8 mm tumor metastasis in the left L1 body. Larger and more confluent roughly 18 mm tumor in the right T12 inferolateral endplate. Smaller separate round 5 mm tumor metastasis in the anterior T12 body. The visible T11 level is spared. Conus medullaris and cauda equina: Conus extends to the L1-L2 level. No lower spinal cord or conus signal abnormality. No abnormal intradural enhancement is identified. But there is bulky epidural and/or dural tumor beginning at the L4 level and obliterating the spinal canal by S1. Paraspinal and other soft tissues: Early paraspinal extension of tumor from the involved segments as detailed above. In the visible abdomen and pelvis no primary tumor is identified. There is extensive diverticulosis of the distal colon. Disc levels: Superimposed lumbar degenerative changes L2-L3 through L4-L5, including up to moderate degenerative L2 and L4 neural foraminal stenosis, and up to severe degenerative  left L3-L4 neural foramen and lateral recess stenosis related to left foraminal and caudal disc herniation seen on series 5, image 10 and series 8, image 23 (severe left L3 and L4 nerve involvement). IMPRESSION: 1. 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 Additional metastatic involvement T12, L1 through L3. No primary tumor site identified. Top differential considerations are Metastatic Disease Unknown primary, less likely Lymphoma or Multiple Myeloma. 2. Superimposed lumbar spine degeneration, including degenerative  moderate to severe left L3 and L4 nerve level impingement from disc herniation. Electronically Signed: By: Genevie Ann M.D. On: 06/27/2021 10:46   [place] CHEST (PLEURAL EFFUSION)  Result Date: 07/14/2021 CLINICAL DATA:  70 year old female with history of pleural effusion. EXAM: CHEST ULTRASOUND COMPARISON:  None. FINDINGS: Trace bilateral pleural effusions. IMPRESSION: Trace bilateral pleural effusions. No safe window for thoracentesis. Ruthann Cancer, MD [facility] Electronically Signed   By: Ruthann Cancer M.D.   On: 07/14/2021 15:08   CT CHEST ABDOMEN PELVIS W CONTRAST  Result Date: 07/02/2021 CLINICAL DATA:  Remote history of breast cancer (2010). New lumbosacral bone lesions found on MRI lumbar spine 06/25/2021 EXAM: CT CHEST, ABDOMEN, AND PELVIS WITH CONTRAST TECHNIQUE: Multidetector CT imaging of the chest, abdomen and pelvis was performed following the standard protocol during bolus administration of intravenous contrast. CONTRAST:  71mL OMNIPAQUE IOHEXOL 350 MG/ML SOLN COMPARISON:  Lumbar spine MRI from 06/25/2021 FINDINGS: CT CHEST FINDINGS Cardiovascular: The heart is normal in size. No pericardial effusion. The aorta is normal in caliber. No dissection. Moderate atherosclerotic calcifications. Scattered coronary artery calcifications. Mediastinum/Nodes: Mediastinal and hilar lymphadenopathy consistent with metastatic disease. Pretracheal lymph node on image 21/2 measures 20 mm Prevascular node on image 21/2 measures 12 mm. Right hilar node on image 25/2 measures 19 mm. Subcarinal node on image 28/2 measures 14 mm The esophagus is grossly normal. Lungs/Pleura: Innumerable small pulmonary and pleural nodules involving both lungs consistent with diffuse metastatic disease. There are associated bilateral pleural effusions likely malignant. Musculoskeletal: No breast masses, supraclavicular or axillary adenopathy. The thyroid gland is unremarkable.  Mixed lytic and sclerotic metastatic bone disease most notably affecting T3 and T7. Mild pathologic compression fracture of T7 canal compromise. There is also a small lytic lesion involving T12. Suspect a mildly sclerotic lesion involving the manubrium of the sternum. Subtle mixed lytic and sclerotic lesion involving the upper sternum with some erosion of the posterior cortex. CT ABDOMEN PELVIS FINDINGS Hepatobiliary: There are 5 vague low-attenuation hepatic lesions consistent with metastatic disease. 17 mm lesion in segment 8, 11.5 mm lesion in segment 3, 10 mm lesion in segment 4B, 15 mm lesion lower in segment 4B and 15 mm lesion in segment 6. The gallbladder is unremarkable.  No common bile duct dilatation. Pancreas: No mass, inflammation or ductal dilatation. Spleen: Normal size.  No focal lesions. Adrenals/Urinary Tract: Bilateral adrenal gland lesions likely metastatic disease. The largest lesion measures 13 mm in the left gland. The kidneys are unremarkable. No renal lesions or hydronephrosis. The bladder is unremarkable. Stomach/Bowel: The stomach, duodenum, small bowel and colon are grossly normal. Vascular/Lymphatic: Moderate atherosclerotic calcifications involving the aorta and branch vessels but no aneurysm or dissection. No mesenteric or retroperitoneal mass or adenopathy. Reproductive: The uterus and ovaries are unremarkable. Other: No pelvic mass or adenopathy. No free  pelvic fluid collections. No inguinal mass or adenopathy. No abdominal wall hernia or subcutaneous lesions. Musculoskeletal: Severe destructive metastatic bone disease involving the lower lumbar vertebral bodies, the sacrum and iliac bones. The sacrum is largely destroyed. No definite involvement the hips. IMPRESSION: 1. Innumerable small pulmonary and pleural nodules consistent with diffuse metastatic disease. Associated probable malignant pleural effusions. 2. Mediastinal and hilar lymphadenopathy consistent with metastatic  disease. 3. Hepatic and bilateral adrenal gland metastasis. 4. Severe, diffuse and extensive, destructive metastatic bone disease involving the pelvis. Other metastatic bone lesions as detailed above. Aortic Atherosclerosis (ICD10-I70.0). Electronically Signed   By: Marijo Sanes M.D.   On: 07/02/2021 18:38   ECHOCARDIOGRAM COMPLETE  Result Date: 07/13/2021    ECHOCARDIOGRAM REPORT   Patient Name:   Amber Blevins Date of Exam: 07/13/2021 Medical Rec #:  [id number]          Height:       65.0 in Accession #:    [id number]         Weight:       182.1 lb Date of Birth:  [birth year]          BSA:          1.901 m Patient Age:    40 years           BP:           151/79 mmHg Patient Gender: F                  HR:           100 bpm. Exam Location:  [facility] Procedure: 2D Echo, Cardiac Doppler and Color Doppler Indications:     SOB (shortness of breath) [[id number]]  History:         Patient has no prior history of Echocardiogram examinations.                  Risk Factors:Hypertension.  Sonographer:     Alyse Low Roar Referring Phys:  [place] Diagnosing Phys: Ida Rogue MD IMPRESSIONS  1. Left ventricular ejection fraction, by estimation, is 60 to 65%. The left ventricle has normal function. The left ventricle has no regional wall motion abnormalities. There is mild left ventricular hypertrophy. Left ventricular diastolic parameters are consistent with Grade I diastolic dysfunction (impaired relaxation).  2. Right ventricular systolic function is normal. The right ventricular size is normal. Tricuspid regurgitation signal is inadequate for assessing PA pressure.  3. The mitral valve is normal in structure. No evidence of mitral valve regurgitation. No evidence of mitral stenosis. FINDINGS  Left Ventricle: Left ventricular ejection fraction, by estimation, is 60 to 65%. The left ventricle has normal function. The left ventricle has no regional wall motion abnormalities. The left ventricular internal cavity  size was normal in size. There is  mild left ventricular hypertrophy. Left ventricular diastolic parameters are consistent with Grade I diastolic dysfunction (impaired relaxation). Right Ventricle: The right ventricular size is normal. No increase in right ventricular wall thickness. Right ventricular systolic function is normal. Tricuspid regurgitation signal is inadequate for assessing PA pressure. Left Atrium: Left atrial size was normal in size. Right Atrium: Right atrial size was normal in size. Pericardium: There is no evidence of pericardial effusion. Mitral Valve: The mitral valve is normal in structure. No evidence of mitral valve regurgitation. No evidence of mitral valve stenosis. Tricuspid Valve: The tricuspid valve is normal in structure. Tricuspid valve regurgitation is not demonstrated. No evidence of tricuspid stenosis. Aortic  Valve: The aortic valve was not well visualized. Aortic valve regurgitation is not visualized. No aortic stenosis is present. Aortic valve peak gradient measures 9.4 mmHg. Pulmonic Valve: The pulmonic valve was normal in structure. Pulmonic valve regurgitation is not visualized. No evidence of pulmonic stenosis. Aorta: The aortic root is normal in size and structure. Venous: The inferior vena cava is normal in size with greater than 50% respiratory variability, suggesting right atrial pressure of 3 mmHg. IAS/Shunts: No atrial level shunt detected by color flow Doppler.  LEFT VENTRICLE PLAX 2D LVIDd:         3.91 cm   Diastology LVIDs:         2.70 cm   LV e' medial:    4.24 cm/s LV PW:         1.16 cm   LV E/e' medial:  17.9 LV IVS:        1.31 cm   LV e' lateral:   5.87 cm/s LVOT diam:     1.80 cm   LV E/e' lateral: 12.9 LVOT Area:     2.54 cm  RIGHT VENTRICLE RV Basal diam:  2.51 cm RV Mid diam:    2.14 cm RV S prime:     12.10 cm/s TAPSE (M-mode): 2.2 cm LEFT ATRIUM             Index        RIGHT ATRIUM          Index LA diam:        3.40 cm 1.79 cm/m   RA Area:     9.27  cm LA Vol (A2C):   45.1 ml 23.73 ml/m  RA Volume:   15.90 ml 8.36 ml/m LA Vol (A4C):   33.1 ml 17.41 ml/m LA Biplane Vol: 40.3 ml 21.20 ml/m  AORTIC VALVE                 PULMONIC VALVE AV Area (Vmax): 1.73 cm     PV Vmax:          1.02 m/s AV Vmax:        153.00 cm/s  PV Peak grad:     4.2 mmHg AV Peak Grad:   9.4 mmHg     PR End Diast Vel: 7.95 msec LVOT Vmax:      104.00 cm/s  RVOT Peak grad:   2 mmHg  AORTA Ao Root diam: 2.60 cm MITRAL VALVE MV Area (PHT): 4.41 cm     SHUNTS MV Decel Time: 172 msec     Systemic Diam: 1.80 cm MV E velocity: 75.80 cm/s MV A velocity: 105.00 cm/s MV E/A ratio:  0.72 MV A Prime:    12.7 cm/s Ida Rogue MD Electronically signed by Ida Rogue MD Signature Date/Time: 07/13/2021/1:49:22 PM    Final    US THORACENTESIS ASP PLEURAL SPACE W/IMG GUIDE  Result Date: 07/12/2021 INDICATION: Patient with history of metastatic breast cancer, dyspnea, bilateral pleural effusions; request received for diagnostic and therapeutic right thoracentesis. EXAM: ULTRASOUND GUIDED DIAGNOSTIC AND THERAPEUTIC RIGHT THORACENTESIS MEDICATIONS: 10 mL 1% lidocaine COMPLICATIONS: None immediate. PROCEDURE: An ultrasound guided thoracentesis was thoroughly discussed with the patient and questions answered. The benefits, risks, alternatives and complications were also discussed. The patient understands and wishes to proceed with the procedure. Written consent was obtained. Ultrasound was performed to localize and mark an adequate pocket of fluid in the right chest. The area was then prepped and draped in the normal sterile fashion. 1% Lidocaine was used  for local anesthesia. Under ultrasound guidance a 6 Fr Safe-T-Centesis catheter was introduced. Thoracentesis was performed. The catheter was removed and a dressing applied. FINDINGS: A total of approximately 750 cc of yellow fluid was removed. Samples were sent to the laboratory as requested by the clinical team. IMPRESSION: Successful  ultrasound guided diagnostic and therapeutic right thoracentesis yielding 750 cc of pleural fluid. Read by: Rowe Robert, PA-C Electronically Signed   By: Jerilynn Mages.  Shick M.D.   On: 07/12/2021 12:40    Labs:  CBC: Recent Labs    07/02/21 1604 07/11/21 2216  WBC 12.3* 10.9*  HGB 13.3 14.2  HCT 41.2 41.3  PLT 384 389    COAGS: No results for input(s): INR, APTT in the last 8760 hours.  BMP: Recent Labs    07/02/21 1604 07/11/21 2216  NA 132* 137  K 3.6 3.7  CL 95* 99  CO2 26 27  GLUCOSE 100* 110*  BUN 18 18  CALCIUM 9.4 9.8  CREATININE 0.75 0.72  GFRNONAA >60 >60    LIVER FUNCTION TESTS: Recent Labs    07/02/21 1604  BILITOT 1.2  AST 28  ALT 18  ALKPHOS 107  PROT 8.3*  ALBUMIN 4.3    TUMOR MARKERS: No results for input(s): AFPTM, CEA, CA199, CHROMGRNA in the last 8760 hours.  Assessment and Plan:  Amber Blevins is a 71 y.o. female with past medical history significant for hypertension, hyperlipidemia and breast cancer who presents today for CT-guided biopsy of lytic lesion involving the right hemipelvis worrisome for metastatic breast cancer.  Patient is accompanied by her husband though serves as her own historian  Risks and benefits of CT-guided bone lesion was discussed with the patient including, but not limited to bleeding, infection, damage to adjacent structures or low yield requiring additional tests.  All of the questions were answered and there is agreement to proceed.  Consent signed and in chart.   Thank you for this interesting consult.  I greatly enjoyed meeting [facility] and look forward to participating in their care.  A copy of this report was sent to the requesting provider on this date.  Electronically Signed: Sandi Mariscal, MD 07/16/2021, 10:27 AM   I spent a total of 15 Minutes in face to face in clinical consultation, greater than 50% of which was counseling/coordinating care for CT-guided bone lesion biopsy

## 2021-07-16 NOTE — OR Nursing (Signed)
Pt became very short of breath just dressing (on room air) placed on 2 liters [place] when getting dressed. Her Sats were 88-99% before getting dressed when just lying in bed. She reported feeling better when on oxygen when active but she didn't have any at home at this time. Dr's Vella Kohler and Dr Tasia Catchings informed

## 2021-07-16 NOTE — Telephone Encounter (Signed)
Spoke with pathology, prelim report of pleural fluid cytology- metastatic breast cancer, ER/PR/HER2 status pending.  She is s/p biopsy of bone mass.  She has SOB with exertion and per IR RN Oxygen dropped to 88% and improved after started on O2 2L.  Will start process of arranging oxygen.  Tentatively plan chemotherpay Taxol for visceral crisis.

## 2021-07-17 ENCOUNTER — Other Ambulatory Visit: Payer: Self-pay

## 2021-07-17 ENCOUNTER — Inpatient Hospital Stay: Payer: Medicare PPO

## 2021-07-17 ENCOUNTER — Ambulatory Visit: Admission: RE | Admit: 2021-07-17 | Payer: Medicare PPO | Source: Ambulatory Visit

## 2021-07-17 ENCOUNTER — Other Ambulatory Visit: Payer: Self-pay | Admitting: Oncology

## 2021-07-17 DIAGNOSIS — Z853 Personal history of malignant neoplasm of breast: Secondary | ICD-10-CM

## 2021-07-17 DIAGNOSIS — Z510 Encounter for antineoplastic radiation therapy: Secondary | ICD-10-CM | POA: Diagnosis not present

## 2021-07-17 DIAGNOSIS — C7951 Secondary malignant neoplasm of bone: Secondary | ICD-10-CM

## 2021-07-17 DIAGNOSIS — C50919 Malignant neoplasm of unspecified site of unspecified female breast: Secondary | ICD-10-CM | POA: Insufficient documentation

## 2021-07-17 DIAGNOSIS — C801 Malignant (primary) neoplasm, unspecified: Secondary | ICD-10-CM

## 2021-07-17 LAB — CULTURE, BLOOD (ROUTINE X 2)
Culture: NO GROWTH
Culture: NO GROWTH
Special Requests: ADEQUATE
Special Requests: ADEQUATE

## 2021-07-17 MED ORDER — ONDANSETRON HCL 8 MG PO TABS: 8.0000 mg | ORAL_TABLET | Freq: Two times a day (BID) | ORAL | 1 refills | Status: DC | PRN

## 2021-07-17 MED ORDER — PROCHLORPERAZINE MALEATE 10 MG PO TABS: 10.0000 mg | ORAL_TABLET | Freq: Four times a day (QID) | ORAL | 1 refills | Status: DC | PRN

## 2021-07-17 NOTE — Progress Notes (Signed)
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

## 2021-07-18 ENCOUNTER — Other Ambulatory Visit: Payer: Self-pay

## 2021-07-18 DIAGNOSIS — C50919 Malignant neoplasm of unspecified site of unspecified female breast: Secondary | ICD-10-CM

## 2021-07-18 NOTE — Telephone Encounter (Signed)
Patient called stating [facility] needing further information to process order for home use Oxygen. Put new order in with updated information requested. Caryn Section with [employer] at [phone] to inform of new order. No answer, LMTCB.

## 2021-07-19 ENCOUNTER — Encounter: Payer: Self-pay | Admitting: Oncology

## 2021-07-21 ENCOUNTER — Inpatient Hospital Stay: Payer: Medicare PPO | Admitting: Oncology

## 2021-07-21 ENCOUNTER — Encounter: Payer: Self-pay | Admitting: Oncology

## 2021-07-21 ENCOUNTER — Other Ambulatory Visit: Payer: Self-pay

## 2021-07-21 ENCOUNTER — Ambulatory Visit
Admission: RE | Admit: 2021-07-21 | Discharge: 2021-07-21 | Disposition: A | Discharge status: Home / Self Care | Payer: Medicare PPO | Source: Ambulatory Visit | Attending: Radiation Oncology | Admitting: Radiation Oncology

## 2021-07-21 ENCOUNTER — Inpatient Hospital Stay: Payer: Medicare PPO

## 2021-07-21 ENCOUNTER — Other Ambulatory Visit: Payer: Self-pay | Admitting: Oncology

## 2021-07-21 VITALS — BP 183/89 | HR 69 | Resp 20

## 2021-07-21 VITALS — BP 154/79 | HR 69 | Temp 97.9°F | Wt 178.7 lb

## 2021-07-21 DIAGNOSIS — C50919 Malignant neoplasm of unspecified site of unspecified female breast: Secondary | ICD-10-CM

## 2021-07-21 DIAGNOSIS — C7951 Secondary malignant neoplasm of bone: Secondary | ICD-10-CM

## 2021-07-21 DIAGNOSIS — Z510 Encounter for antineoplastic radiation therapy: Secondary | ICD-10-CM | POA: Diagnosis not present

## 2021-07-21 DIAGNOSIS — C801 Malignant (primary) neoplasm, unspecified: Secondary | ICD-10-CM

## 2021-07-21 LAB — COMPREHENSIVE METABOLIC PANEL
ALT: 20 U/L (ref 0–44)
AST: 32 U/L (ref 15–41)
Albumin: 3.8 g/dL (ref 3.5–5.0)
Alkaline Phosphatase: 96 U/L (ref 38–126)
Anion gap: 10 (ref 5–15)
BUN: 28 mg/dL — ABNORMAL HIGH (ref 8–23)
CO2: 27 mmol/L (ref 22–32)
Calcium: 9.2 mg/dL (ref 8.9–10.3)
Chloride: 93 mmol/L — ABNORMAL LOW (ref 98–111)
Creatinine, Ser: 0.7 mg/dL (ref 0.44–1.00)
GFR, Estimated: >60 mL/min (ref >60)
Glucose, Bld: 182 mg/dL — ABNORMAL HIGH (ref 70–99)
Potassium: 3.8 mmol/L (ref 3.5–5.1)
Sodium: 130 mmol/L — ABNORMAL LOW (ref 135–145)
Total Bilirubin: 0.8 mg/dL (ref 0.3–1.2)
Total Protein: 7.5 g/dL (ref 6.5–8.1)

## 2021-07-21 LAB — CBC WITH DIFFERENTIAL/PLATELET
Abs Immature Granulocytes: 0.08 10*3/uL — ABNORMAL HIGH (ref 0.00–0.07)
Basophils Absolute: 0 10*3/uL (ref 0.0–0.1)
Basophils Relative: 0 %
Eosinophils Absolute: 0 10*3/uL (ref 0.0–0.5)
Eosinophils Relative: 0 %
HCT: 43 % (ref 36.0–46.0)
Hemoglobin: 14 g/dL (ref 12.0–15.0)
Immature Granulocytes: 1 %
Lymphocytes Relative: 6 %
Lymphs Abs: 0.8 10*3/uL (ref 0.7–4.0)
MCH: 28.7 pg (ref 26.0–34.0)
MCHC: 32.6 g/dL (ref 30.0–36.0)
MCV: 88.3 fL (ref 80.0–100.0)
Monocytes Absolute: 0.8 10*3/uL (ref 0.1–1.0)
Monocytes Relative: 6 %
Neutro Abs: 11.1 10*3/uL — ABNORMAL HIGH (ref 1.7–7.7)
Neutrophils Relative %: 87 %
Platelets: 443 10*3/uL — ABNORMAL HIGH (ref 150–400)
RBC: 4.87 MIL/uL (ref 3.87–5.11)
RDW: 14 % (ref 11.5–15.5)
WBC: 12.7 10*3/uL — ABNORMAL HIGH (ref 4.0–10.5)
nRBC: 0 % (ref 0.0–0.2)

## 2021-07-21 LAB — SURGICAL PATHOLOGY

## 2021-07-21 MED ORDER — SODIUM CHLORIDE 0.9 % IV SOLN
10.0000 mg | Freq: Once | INTRAVENOUS | Status: AC
  Administered 2021-07-21: 10 mg via INTRAVENOUS
  Filled 2021-07-21: qty 10

## 2021-07-21 MED ORDER — SODIUM CHLORIDE 0.9 % IV SOLN
80.0000 mg/m2 | Freq: Once | INTRAVENOUS | Status: AC
  Administered 2021-07-21: 156 mg via INTRAVENOUS
  Filled 2021-07-21: qty 26

## 2021-07-21 MED ORDER — LORAZEPAM 0.5 MG PO TABS: 0.5000 mg | ORAL_TABLET | Freq: Every evening | ORAL | 0 refills | Status: DC | PRN

## 2021-07-21 MED ORDER — FAMOTIDINE 20 MG IN NS 100 ML IVPB
20.0000 mg | Freq: Once | INTRAVENOUS | Status: AC
  Administered 2021-07-21: 20 mg via INTRAVENOUS
  Filled 2021-07-21: qty 20

## 2021-07-21 MED ORDER — SODIUM CHLORIDE 0.9 % IV SOLN
Freq: Once | INTRAVENOUS | Status: AC
  Filled 2021-07-21: qty 250

## 2021-07-21 MED ORDER — DEXAMETHASONE 4 MG PO TABS: 4.0000 mg | ORAL_TABLET | Freq: Every day | ORAL | 0 refills | Status: DC

## 2021-07-21 MED ORDER — DIPHENHYDRAMINE HCL 50 MG/ML IJ SOLN
50.0000 mg | Freq: Once | INTRAMUSCULAR | Status: AC
  Administered 2021-07-21: 50 mg via INTRAVENOUS
  Filled 2021-07-21: qty 1

## 2021-07-21 MED ORDER — OXYCODONE HCL 5 MG PO TABS
5.0000 mg | ORAL_TABLET | Freq: Once | ORAL | Status: AC
  Administered 2021-07-21: 5 mg via ORAL
  Filled 2021-07-21: qty 1

## 2021-07-21 MED ORDER — OXYCODONE HCL 5 MG PO TABS: 5.0000 mg | ORAL_TABLET | Freq: Four times a day (QID) | ORAL | 0 refills | Status: DC | PRN

## 2021-07-21 NOTE — Progress Notes (Signed)
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  female with [place] listed below was seen in consultation at the request of  Gayland Curry, MD  for evaluation of tumor in lumbar vertebrae  # patient reports feeling tightness and pain in Feb 2022. She was started on conservative management which did not help her symptoms. She also started to experience numbness.  She takes gabapentin which partially relieve the discomfort. She denies bowel or bladder incontinence, no lower extremity weakness.   06/25/2021 MRI lumbar spine IMPRESSION:  1. 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 Additional metastatic involvement T12, L1 through L3.  No primary tumor site identified. Top differential considerations are Metastatic Disease Unknown primary, less likely Lymphoma or Multiple Myeloma.   2. Superimposed lumbar spine degeneration, including degenerative moderate to severe left L3 and L4 nerve level impingement from disc herniation.    Patient was seen by neurosurgeon Dr.Yarbrough today. He has ordered MRI cervical and thoracic spine and sacrum biopsy for further work up.   Patient has a history of breast cancer, diagnosed in 2010 Left-sided T2 (2.4cm) N0, ER/PR positive, HER2 negative IDC of the breast, s/p lumpectomy by Dr.Meyer at [facility] Oncotype score of 11 who completed radiation and she finished 10 years of Femara  from 05/2009 and stopped in 2020.  she has intentionally lost some weight, no night sweating, fever.    INTERVAL HISTORY Amber Blevins is a 70 y.o. female who has above history reviewed by me today presents for follow up visit for management of metastatic breast cancer . Problems and complaints are listed below:   07/02/2021, CT chest abdomen pelvis showed innumerable small pulmonary and pleural nodules consistent with diffuse metastasis.  Associated with probable malignant pleural effusion.  Mediastinal and hilar lymphadenopathy consistent with metastatic disease.  Hepatic and bilateral adrenal gland metastasis.  Diffuse extensive destructive metastatic bone disease involving pelvis.   07/11/2021 - 07/14/2021  Patient was admitted to hospital due to shortness of breath. 07/12/2021, CT chest PE protocol showed no PE.  Further increase of March pleural effusion and admit to moderate interstitial pulmonary edema.  Bulky mediastinal and hilar lymphadenopathy.  Unchanged.  Incompletely visualized left adrenal nodule. 07/12/2021 patient underwent right thoracentesis.  Cytology was positive for metastatic carcinoma, compatible with breast origin.  ER/PR/HER2 status is pending.  07/16/2021, patient underwent iliac bone biopsy.  Positive for metastatic carcinoma.  Patient was noticed to have shortness of breath with exertion during her biopsy appointment.  O2 saturation dropped down to 88%.  Improved after started on nasal cannula oxygen.  Home oxygen has been arranged for her and she reports feeling better today.  She has tried tramadol for her lower back pain which did not help.  She also feels anxious and has not been able to sleep.  She is currently taking dexamethasone, started last week.  Review of Systems  Constitutional:  Negative for appetite change, chills, fatigue and fever.  HENT:   Negative for hearing loss and voice change.  Eyes:  Negative for eye problems.  Respiratory:   Negative for chest tightness and cough.   Cardiovascular:  Negative for chest pain.  Gastrointestinal:  Negative for abdominal distention, abdominal pain and blood in stool.  Endocrine: Negative for hot flashes.  Genitourinary:  Negative for difficulty urinating and frequency.   Musculoskeletal:  Positive for back pain. Negative for arthralgias.       Lower back/sacrum pain and numbness  Skin:  Negative for itching and rash.  Neurological:  Negative for extremity weakness.  Hematological:  Negative for adenopathy.  Psychiatric/Behavioral:  Negative for confusion.    MEDICAL HISTORY:  Past Medical History:  Diagnosis Date   Breast cancer ([place])    High cholesterol    Hypertension    Personal history of radiation therapy     SURGICAL HISTORY: Past Surgical History:  Procedure Laterality Date   BREAST LUMPECTOMY Left    2010   thorocentesis Right     SOCIAL HISTORY: Social History   Socioeconomic History   Marital status: Married    Spouse name: Not on file   Number of children: Not on file   Years of education: Not on file   Highest education level: Not on file  Occupational History   Not on file  Tobacco Use   Smoking status: Former    Packs/day: 1.00    Years: 25.00    Pack years: 25.00    Types: Cigarettes    Quit date: 24    Years since quitting: 32.8   Smokeless tobacco: Never  Vaping Use   Vaping Use: Never used  Substance and Sexual Activity   Alcohol use: Yes   Drug use: Never   Sexual activity: Not on file  Other Topics Concern   Not on file  Social History Narrative   Not on file   Social Determinants of Health   Financial Resource Strain: Not on file  Food Insecurity: Not on file  Transportation Needs: Not on file  Physical Activity: Not on file  Stress: Not on file  Social Connections: Not on file  Intimate Partner Violence: Not on file    FAMILY HISTORY: Family History  Problem Relation Age of Onset   Cancer Mother         gynecological   Lung cancer Mother    Diabetes Father    Heart disease Father    Parkinson's disease Father    Brain cancer Brother    Bladder Cancer Brother    Pulmonary disease Brother    Rheumatic fever Brother    Breast cancer Neg Hx     ALLERGIES:  is allergic to green tea (camellia sinensis), melaleuca viridiflora, and lisinopril.  MEDICATIONS:  Current Outpatient Medications  Medication Sig Dispense Refill   acetaminophen (TYLENOL) 650 MG CR tablet Take 650 mg by mouth every 8 (eight) hours as needed for pain.     albuterol (VENTOLIN HFA) 108 (90 Base) MCG/ACT inhaler Inhale 2 puffs into the lungs every 6 (six) hours as needed.     aspirin 81 MG EC tablet Take by mouth.     atorvastatin (LIPITOR) 40 MG tablet Take 40 mg by mouth at bedtime.     Boswellia-Glucosamine-Vit D (OSTEO BI-FLEX-GLUCOS/5-LOXIN) TABS Take 1 capsule by mouth in the morning and at bedtime.     Calcium Carb-Cholecalciferol 600-400 MG-UNIT TABS Take 1 tablet by mouth daily.     Docusate Sodium (DSS) 100 MG CAPS Take 1 capsule by mouth every other day.  Fluticasone-Umeclidin-Vilant (TRELEGY ELLIPTA) 100-62.5-25 MCG/INH AEPB Inhale into the lungs. Inhale 1 inhalation into the lungs once daily     gabapentin (NEURONTIN) 300 MG capsule Take 2 capsules by mouth in the morning, at noon, and at bedtime.     LORazepam (ATIVAN) 0.5 MG tablet Take 1 tablet (0.5 mg total) by mouth at bedtime as needed for anxiety. 30 tablet 0   magnesium chloride (SLOW-MAG) 64 MG TBEC SR tablet Take 1 tablet by mouth at bedtime.     metoprolol-hydrochlorothiazide (LOPRESSOR HCT) 50-25 MG tablet Take 1 tablet by mouth daily. Take 1 tablet by mouth once daily     ondansetron (ZOFRAN) 8 MG tablet Take 1 tablet (8 mg total) by mouth 2 (two) times daily as needed (Nausea or vomiting). 30 tablet 1   oxyCODONE (OXY IR/ROXICODONE) 5 MG immediate release tablet Take 1 tablet (5 mg total) by mouth every 6 (six) hours as needed for severe pain.  30 tablet 0   prochlorperazine (COMPAZINE) 10 MG tablet Take 1 tablet (10 mg total) by mouth every 6 (six) hours as needed (Nausea or vomiting). 30 tablet 1   Turmeric (QC TUMERIC COMPLEX PO) Take 1 capsule by mouth at bedtime.     dexamethasone (DECADRON) 4 MG tablet Take 1 tablet (4 mg total) by mouth daily. 20 tablet 0   ibuprofen (ADVIL) 200 MG tablet Take by mouth. Take 400 mg by mouth every six (6) hours as needed for pain. (Patient not taking: No sig reported)     No current facility-administered medications for this visit.   Facility-Administered Medications Ordered in Other Visits  Medication Dose Route Frequency Provider Last Rate Last Admin   PACLitaxel (TAXOL) 156 mg in sodium chloride 0.9 % 250 mL chemo infusion (</= 37m/m2)  80 mg/m2 (Treatment Plan Recorded) Intravenous Once [name] MD 276 mL/hr at 07/21/21 1118 156 mg at 07/21/21 1118     PHYSICAL EXAMINATION: ECOG PERFORMANCE STATUS: 1 - Symptomatic but completely ambulatory Vitals:   07/21/21 0832  BP: (!) 154/79  Pulse: 69  Temp: 97.9 F (36.6 C)  SpO2: 98%   Filed Weights   07/21/21 0832  Weight: 178 lb 11.2 oz (81.1 kg)    Physical Exam Constitutional:      General: She is not in acute distress. HENT:     Head: Normocephalic and atraumatic.  Eyes:     General: No scleral icterus. Cardiovascular:     Rate and Rhythm: Normal rate and regular rhythm.     Heart sounds: Normal heart sounds.  Pulmonary:     Effort: Pulmonary effort is normal. No respiratory distress.     Breath sounds: No wheezing.     Comments: Decreased breath sound bilaterally Abdominal:     General: Bowel sounds are normal. There is no distension.     Palpations: Abdomen is soft.  Musculoskeletal:        General: No deformity. Normal range of motion.     Cervical back: Normal range of motion and neck supple.  Skin:    General: Skin is warm and dry.     Findings: No erythema or rash.  Neurological:     Mental Status: She is  alert and oriented to person, place, and time. Mental status is at baseline.     Cranial Nerves: No cranial nerve deficit.     Coordination: Coordination normal.  Psychiatric:        Mood and Affect: Mood normal.    LABORATORY DATA:  I have  reviewed the data as listed Lab Results  Component Value Date   WBC 12.7 (H) 07/21/2021   HGB 14.0 07/21/2021   HCT 43.0 07/21/2021   MCV 88.3 07/21/2021   PLT 443 (H) 07/21/2021   Recent Labs    07/02/21 1604 07/11/21 2216 07/21/21 0805  NA 132* 137 130*  K 3.6 3.7 3.8  CL 95* 99 93*  CO2 _0 GLUCOSE 100* 110* 182*  BUN 18 18 28*  CREATININE 0.75 0.72 0.70  CALCIUM 9.4 9.8 9.2  GFRNONAA >60 >60 >60  PROT 8.3*  --  7.5  ALBUMIN 4.3  --  3.8  AST 28  --  32  ALT 18  --  20  ALKPHOS 107  --  96  BILITOT 1.2  --  0.8    Iron/TIBC/Ferritin/ %Sat No results found for: IRON, TIBC, FERRITIN, IRONPCTSAT    RADIOGRAPHIC STUDIES: I have personally reviewed the radiological images as listed and agreed with the findings in the report. DG Chest 1 View  Result Date: 07/12/2021 CLINICAL DATA:  Status post right thoracentesis EXAM: CHEST  1 VIEW COMPARISON:  07/11/2021 FINDINGS: Improvement in the right effusion following thoracentesis. Trace effusions remain bilaterally. No pneumothorax. Stable heart size. Similar diffuse interstitial opacities suggesting edema and persistent bibasilar atelectasis. Trachea midline. Aorta atherosclerotic. Degenerative changes of the spine. IMPRESSION: Negative for pneumothorax following right thoracentesis. Stable exam. Electronically Signed   By: Jerilynn Mages.  Shick M.D.   On: 07/12/2021 13:21   DG Chest 2 View  Result Date: 07/11/2021 CLINICAL DATA:  Worsening shortness of breath. EXAM: CHEST - 2 VIEW COMPARISON:  None. FINDINGS: Mild to moderate severity diffusely increased interstitial lung markings are seen. Mild to moderate severity areas of bibasilar atelectasis and/or early infiltrate are also present.  There are small bilateral pleural effusions. No pneumothorax is identified. The heart size and mediastinal contours are within normal limits. There is moderate severity calcification of the aortic arch. The visualized skeletal structures are unremarkable. IMPRESSION: 1. Mild to moderate severity interstitial edema with bibasilar atelectasis and/or early infiltrate. 2. Small bilateral pleural effusions. Electronically Signed   By: Virgina Norfolk M.D.   On: 07/11/2021 23:03   CT Angio Chest PE W/Cm &/Or Wo Cm  Result Date: 07/12/2021 CLINICAL DATA:  Shortness of breath EXAM: CT ANGIOGRAPHY CHEST WITH CONTRAST TECHNIQUE: Multidetector CT imaging of the chest was performed using the standard protocol during bolus administration of intravenous contrast. Multiplanar CT image reconstructions and MIPs were obtained to evaluate the vascular anatomy. CONTRAST:  40m OMNIPAQUE IOHEXOL 350 MG/ML SOLN COMPARISON:  None. FINDINGS: Cardiovascular: Contrast injection is sufficient to demonstrate satisfactory opacification of the pulmonary arteries to the segmental level. There is no pulmonary embolus or evidence of right heart strain. The size of the main pulmonary artery is normal. Heart size is normal, with no pericardial effusion. The course and caliber of the aorta are normal. There is atherosclerotic calcification. No acute aortic syndrome. Mediastinum/Nodes: There is mediastinal and hilar lymphadenopathy that is unchanged. Right hilar nodes cause marked narrowing of right middle lobe arterial branches. No axillary adenopathy is visible. Soft tissue focus in the outer left breast is unchanged. Lungs/Pleura: Large pleural effusions. Airways are patent. There is mild-to-moderate interstitial pulmonary edema. Upper Abdomen: Contrast bolus timing is not optimized for evaluation of the abdominal organs. Incompletely visualized left adrenal nodule measures at least 11 mm. Musculoskeletal: No chest wall abnormality. No bony  spinal canal stenosis. Review of the MIP images confirms the above  findings. IMPRESSION: 1. No pulmonary embolus or acute aortic syndrome. 2. Large pleural effusions and mild-to-moderate interstitial pulmonary edema. 3. Bulky mediastinal and hilar lymphadenopathy, unchanged. 4. Incompletely visualized left adrenal nodule. Electronically Signed   By: Ulyses Jarred M.D.   On: 07/12/2021 00:28   MR Lumbar Spine W Wo Contrast  Addendum Date: 06/27/2021   ADDENDUM REPORT: 06/27/2021 11:15 ADDENDUM: Study discussed by telephone with Dr. Sara Chu (covering for Dr. Gayland Curry) on 06/27/2021 at 11:09 . Electronically Signed   By: Genevie Ann M.D.   On: 06/27/2021 11:15   Result Date: 06/27/2021 CLINICAL DATA:  70 year old female with symptoms beginning in February, numbness in the central low back. Progressive right greater than left low back pain and right leg pain. No known injury. EXAM: MRI LUMBAR SPINE WITHOUT AND WITH CONTRAST TECHNIQUE: Multiplanar and multiecho pulse sequences of the lumbar spine were obtained without and with intravenous contrast. CONTRAST:  71m GADAVIST GADOBUTROL 1 MMOL/ML IV SOLN COMPARISON:  Lumbar radiographs 02/06/2021. FINDINGS: Segmentation:  Normal on the May radiographs. Alignment: Lumbar lordosis not significantly changed. Occasional mild degenerative spondylolisthesis. Vertebrae: Subtotal tumor replacement of the visible sacrum and pelvis (series 9, image 33) with enhancing tumor filling the sacral spinal canal and bilateral S1 through visible S3 neural foramina. Early circumferential extraosseous extension of tumor from the involved sacral and pelvic bones. Total tumor replacement also of the L5 vertebra, with bulky epidural extension, early extension into the bilateral L5 neural foramina. Subtotal tumor replacement of the L4 vertebra. Ventral epidural tumor and early extension into the right L4 neural foramen. Multifocal tumor metastases in the L3 vertebral body, most notably  posteriorly and on the right. Separate bulky tumor of the L3 spinous process with early extraosseous extension. No significant epidural tumor here. Similar patchy metastatic tumor in the central and right L2 body. No epidural tumor. Small round 8 mm tumor metastasis in the left L1 body. Larger and more confluent roughly 18 mm tumor in the right T12 inferolateral endplate. Smaller separate round 5 mm tumor metastasis in the anterior T12 body. The visible T11 level is spared. Conus medullaris and cauda equina: Conus extends to the L1-L2 level. No lower spinal cord or conus signal abnormality. No abnormal intradural enhancement is identified. But there is bulky epidural and/or dural tumor beginning at the L4 level and obliterating the spinal canal by S1. Paraspinal and other soft tissues: Early paraspinal extension of tumor from the involved segments as detailed above. In the visible abdomen and pelvis no primary tumor is identified. There is extensive diverticulosis of the distal colon. Disc levels: Superimposed lumbar degenerative changes L2-L3 through L4-L5, including up to moderate degenerative L2 and L4 neural foraminal stenosis, and up to severe degenerative left L3-L4 neural foramen and lateral recess stenosis related to left foraminal and caudal disc herniation seen on series 5, image 10 and series 8, image 23 (severe left L3 and L4 nerve involvement). IMPRESSION: 1. 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 Additional metastatic involvement T12, L1 through L3. No primary tumor site identified. Top differential considerations are Metastatic Disease Unknown primary, less likely Lymphoma or Multiple Myeloma. 2. Superimposed lumbar spine degeneration, including  degenerative moderate to severe left L3 and L4 nerve level impingement from disc herniation. Electronically Signed: By: HHerminio HeadsD.  On: 06/27/2021 10:46   [place] CHEST (PLEURAL EFFUSION)  Result Date: 07/14/2021 CLINICAL DATA:  69 year old female with history of pleural effusion. EXAM: CHEST ULTRASOUND COMPARISON:  None. FINDINGS: Trace bilateral pleural effusions. IMPRESSION: Trace bilateral pleural effusions. No safe window for thoracentesis. Ruthann Cancer, MD [facility] Electronically Signed   By: Ruthann Cancer M.D.   On: 07/14/2021 15:08   CT CHEST ABDOMEN PELVIS W CONTRAST  Result Date: 07/02/2021 CLINICAL DATA:  Remote history of breast cancer (2010). New lumbosacral bone lesions found on MRI lumbar spine 06/25/2021 EXAM: CT CHEST, ABDOMEN, AND PELVIS WITH CONTRAST TECHNIQUE: Multidetector CT imaging of the chest, abdomen and pelvis was performed following the standard protocol during bolus administration of intravenous contrast. CONTRAST:  11m OMNIPAQUE IOHEXOL 350 MG/ML SOLN COMPARISON:  Lumbar spine MRI from 06/25/2021 FINDINGS: CT CHEST FINDINGS Cardiovascular: The heart is normal in size. No pericardial effusion. The aorta is normal in caliber. No dissection. Moderate atherosclerotic calcifications. Scattered coronary artery calcifications. Mediastinum/Nodes: Mediastinal and hilar lymphadenopathy consistent with metastatic disease. Pretracheal lymph node on image 21/2 measures 20 mm Prevascular node on image 21/2 measures 12 mm. Right hilar node on image 25/2 measures 19 mm. Subcarinal node on image 28/2 measures 14 mm The esophagus is grossly normal. Lungs/Pleura: Innumerable small pulmonary and pleural nodules involving both lungs consistent with diffuse metastatic disease. There are associated bilateral pleural effusions likely malignant. Musculoskeletal: No breast masses, supraclavicular or axillary adenopathy. The thyroid gland is  unremarkable. Mixed lytic and sclerotic metastatic bone disease most notably affecting T3 and T7. Mild pathologic compression fracture of T7 canal compromise. There is also a small lytic lesion involving T12. Suspect a mildly sclerotic lesion involving the manubrium of the sternum. Subtle mixed lytic and sclerotic lesion involving the upper sternum with some erosion of the posterior cortex. CT ABDOMEN PELVIS FINDINGS Hepatobiliary: There are 5 vague low-attenuation hepatic lesions consistent with metastatic disease. 17 mm lesion in segment 8, 11.5 mm lesion in segment 3, 10 mm lesion in segment 4B, 15 mm lesion lower in segment 4B and 15 mm lesion in segment 6. The gallbladder is unremarkable.  No common bile duct dilatation. Pancreas: No mass, inflammation or ductal dilatation. Spleen: Normal size.  No focal lesions. Adrenals/Urinary Tract: Bilateral adrenal gland lesions likely metastatic disease. The largest lesion measures 13 mm in the left gland. The kidneys are unremarkable. No renal lesions or hydronephrosis. The bladder is unremarkable. Stomach/Bowel: The stomach, duodenum, small bowel and colon are grossly normal. Vascular/Lymphatic: Moderate atherosclerotic calcifications involving the aorta and branch vessels but no aneurysm or dissection. No mesenteric or retroperitoneal mass or adenopathy. Reproductive: The uterus and ovaries are unremarkable. Other: No pelvic mass or adenopathy. No free pelvic fluid collections. No inguinal mass or adenopathy. No abdominal wall hernia or subcutaneous lesions. Musculoskeletal: Severe destructive metastatic bone disease involving the lower lumbar vertebral bodies, the sacrum and iliac bones. The sacrum is largely destroyed. No definite involvement the hips. IMPRESSION: 1. Innumerable small pulmonary and pleural nodules consistent with diffuse metastatic disease. Associated probable malignant pleural effusions. 2. Mediastinal and hilar lymphadenopathy consistent with  metastatic disease. 3. Hepatic and bilateral adrenal gland metastasis. 4. Severe, diffuse and extensive, destructive metastatic bone disease involving the pelvis. Other metastatic bone lesions as detailed above. Aortic Atherosclerosis (ICD10-I70.0). Electronically Signed   By: PMarijo SanesM.D.   On: 07/02/2021 18:38   ECHOCARDIOGRAM COMPLETE  Result Date: 07/13/2021    ECHOCARDIOGRAM REPORT   Patient Name:  Amber Blevins Date of Exam: 07/13/2021 Medical Rec #:  [id number]          Height:       65.0 in Accession #:    [id number]         Weight:       182.1 lb Date of Birth:  1951/01/29          BSA:          1.901 m Patient Age:    43 years           BP:           151/79 mmHg Patient Gender: F                  HR:           100 bpm. Exam Location:  [facility] Procedure: 2D Echo, Cardiac Doppler and Color Doppler Indications:     SOB (shortness of breath) [[id number]]  History:         Patient has no prior history of Echocardiogram examinations.                  Risk Factors:Hypertension.  Sonographer:     Alyse Low Roar Referring Phys:  [place] Diagnosing Phys: Ida Rogue MD IMPRESSIONS  1. Left ventricular ejection fraction, by estimation, is 60 to 65%. The left ventricle has normal function. The left ventricle has no regional wall motion abnormalities. There is mild left ventricular hypertrophy. Left ventricular diastolic parameters are consistent with Grade I diastolic dysfunction (impaired relaxation).  2. Right ventricular systolic function is normal. The right ventricular size is normal. Tricuspid regurgitation signal is inadequate for assessing PA pressure.  3. The mitral valve is normal in structure. No evidence of mitral valve regurgitation. No evidence of mitral stenosis. FINDINGS  Left Ventricle: Left ventricular ejection fraction, by estimation, is 60 to 65%. The left ventricle has normal function. The left ventricle has no regional wall motion abnormalities. The left ventricular  internal cavity size was normal in size. There is  mild left ventricular hypertrophy. Left ventricular diastolic parameters are consistent with Grade I diastolic dysfunction (impaired relaxation). Right Ventricle: The right ventricular size is normal. No increase in right ventricular wall thickness. Right ventricular systolic function is normal. Tricuspid regurgitation signal is inadequate for assessing PA pressure. Left Atrium: Left atrial size was normal in size. Right Atrium: Right atrial size was normal in size. Pericardium: There is no evidence of pericardial effusion. Mitral Valve: The mitral valve is normal in structure. No evidence of mitral valve regurgitation. No evidence of mitral valve stenosis. Tricuspid Valve: The tricuspid valve is normal in structure. Tricuspid valve regurgitation is not demonstrated. No evidence of tricuspid stenosis. Aortic Valve: The aortic valve was not well visualized. Aortic valve regurgitation is not visualized. No aortic stenosis is present. Aortic valve peak gradient measures 9.4 mmHg. Pulmonic Valve: The pulmonic valve was normal in structure. Pulmonic valve regurgitation is not visualized. No evidence of pulmonic stenosis. Aorta: The aortic root is normal in size and structure. Venous: The inferior vena cava is normal in size with greater than 50% respiratory variability, suggesting right atrial pressure of 3 mmHg. IAS/Shunts: No atrial level shunt detected by color flow Doppler.  LEFT VENTRICLE PLAX 2D LVIDd:         3.91 cm   Diastology LVIDs:         2.70 cm   LV e' medial:    4.24 cm/s LV PW:  1.16 cm   LV E/e' medial:  17.9 LV IVS:        1.31 cm   LV e' lateral:   5.87 cm/s LVOT diam:     1.80 cm   LV E/e' lateral: 12.9 LVOT Area:     2.54 cm  RIGHT VENTRICLE RV Basal diam:  2.51 cm RV Mid diam:    2.14 cm RV S prime:     12.10 cm/s TAPSE (M-mode): 2.2 cm LEFT ATRIUM             Index        RIGHT ATRIUM          Index LA diam:        3.40 cm 1.79 cm/m   RA  Area:     9.27 cm LA Vol (A2C):   45.1 ml 23.73 ml/m  RA Volume:   15.90 ml 8.36 ml/m LA Vol (A4C):   33.1 ml 17.41 ml/m LA Biplane Vol: 40.3 ml 21.20 ml/m  AORTIC VALVE                 PULMONIC VALVE AV Area (Vmax): 1.73 cm     PV Vmax:          1.02 m/s AV Vmax:        153.00 cm/s  PV Peak grad:     4.2 mmHg AV Peak Grad:   9.4 mmHg     PR End Diast Vel: 7.95 msec LVOT Vmax:      104.00 cm/s  RVOT Peak grad:   2 mmHg  AORTA Ao Root diam: 2.60 cm MITRAL VALVE MV Area (PHT): 4.41 cm     SHUNTS MV Decel Time: 172 msec     Systemic Diam: 1.80 cm MV E velocity: 75.80 cm/s MV A velocity: 105.00 cm/s MV E/A ratio:  0.72 MV A Prime:    12.7 cm/s Ida Rogue MD Electronically signed by Ida Rogue MD Signature Date/Time: 07/13/2021/1:49:22 PM    Final    CT BONE TROCAR/NEEDLE BIOPSY DEEP  Result Date: 07/16/2021 INDICATION: History of breast cancer, now with extensive osseous metastatic disease worrisome for metastatic breast cancer. Please perform CT-guided biopsy for tissue diagnostic purposes. EXAM: CT-GUIDED BONE LESION BIOPSY MEDICATIONS: None ANESTHESIA/SEDATION: Fentanyl 100 mcg IV; Versed 2 mg IV Sedation Time: 16 Minutes; The patient was continuously monitored during the procedure by the interventional radiology nurse under my direct supervision. COMPLICATIONS: None immediate. PROCEDURE: Informed consent was obtained from the patient following an explanation of the procedure, risks, benefits and alternatives. The patient understands, agrees and consents for the procedure. All questions were addressed. A time out was performed prior to the initiation of the procedure. The patient was positioned prone and non-contrast localization CT was performed of the pelvis redemonstrated the infiltrative lesion involving the posterior aspect of the right ilium. The operative site was prepped and draped in the usual sterile fashion. Under sterile conditions and local anesthesia, a 22 gauge spinal needle was  utilized for procedural planning. Next, an 11 gauge coaxial bone biopsy needle was advanced into the infiltrative lesion involving the posterior aspect the right ilium. Needle position was confirmed with CT imaging (image 8, series 3). Next, the inner 13 gauge bone biopsy device was utilized to acquire sample (representative image 10, series 3). Next, the outer 11 gauge biopsy device was utilized to acquire 4 additional samples all under intermittent CT guidance. The needle was removed and superficial hemostasis was obtained with manual compression. A dressing  was applied. The patient tolerated the procedure well without immediate post procedural complication. IMPRESSION: Successful CT guided biopsy of lytic lesion involving the posterior aspect of the right ilium. Electronically Signed   By: Sandi Mariscal M.D.   On: 07/16/2021 12:50   US THORACENTESIS ASP PLEURAL SPACE W/IMG GUIDE  Result Date: 07/12/2021 INDICATION: Patient with history of metastatic breast cancer, dyspnea, bilateral pleural effusions; request received for diagnostic and therapeutic right thoracentesis. EXAM: ULTRASOUND GUIDED DIAGNOSTIC AND THERAPEUTIC RIGHT THORACENTESIS MEDICATIONS: 10 mL 1% lidocaine COMPLICATIONS: None immediate. PROCEDURE: An ultrasound guided thoracentesis was thoroughly discussed with the patient and questions answered. The benefits, risks, alternatives and complications were also discussed. The patient understands and wishes to proceed with the procedure. Written consent was obtained. Ultrasound was performed to localize and mark an adequate pocket of fluid in the right chest. The area was then prepped and draped in the normal sterile fashion. 1% Lidocaine was used for local anesthesia. Under ultrasound guidance a 6 Fr Safe-T-Centesis catheter was introduced. Thoracentesis was performed. The catheter was removed and a dressing applied. FINDINGS: A total of approximately 750 cc of yellow fluid was removed. Samples were  sent to the laboratory as requested by the clinical team. IMPRESSION: Successful ultrasound guided diagnostic and therapeutic right thoracentesis yielding 750 cc of pleural fluid. Read by: Rowe Robert, PA-C Electronically Signed   By: Jerilynn Mages.  Shick M.D.   On: 07/12/2021 12:40      ASSESSMENT & PLAN:  1. Metastatic breast cancer [facility])    # Metastatic Breast cancer with extensive thoracic and bone involvement, in visceral crisis MRI brain is pending I discussed with pathologist Dr.Krayne. ER/PR/HER2 status will be performed on her pleural fluid sample. can not be done on iliac bone sample due to decalcification process.  The diagnosis and care plan were discussed with patient in detail.  The goal of treatment which is to palliate disease, disease related symptoms, improve quality of life and hopefully prolong life was highlighted in our discussion. For her viseral crisis, I recommend her to be started, future plan pending on ER/PR/HER2 status.  Chemotherapy education was provided.  We had discussed the composition of chemotherapy regimen, length of chemo cycle, duration of treatment and the time to assess response to treatment.    I explained to the patient the risks and benefits of chemotherapy including all but not limited to hair loss, mouth sore, nausea, vomiting, diarrhea, low blood counts, bleeding, neuropathy and risk of life threatening infection and even death, secondary malignancy etc.  . Patient voices understanding and willing to proceed chemotherapy. She has had chemotherapy class. Labs are reviewed and discussed with patient. Proceed with cycle 1 Taxol. Instruction of antiemetics were discussed in details with her.   # Bone metastasis palliative RT. She is starting today.  Continue Dexamethasone, taper down to 51m daily.   # Neoplasm related pain,  On gabapentin and PRN Tylenol.  Not controlled with tramadol. Switch to Oxycodone 551mto 4 to 6 hours as needed.  Dispense 30 tablets.   We will further titrate with her pain.  Discussed about the rationale and potential side effects of oxycodone.  #Anxiety and insomnia, recommend low-dose Ativan 0.5 mg QHS PRN #Decreased mobility due to widely metastatic breast cancer.  Wheelchair  Orders Placed This Encounter  Procedures   For home use only DME standard manual wheelchair with seat cushion    Patient suffers from metastatic breast cancer which impairs their ability to perform daily activities like bathing, dressing,  toileting in the home.  A walker will not resolve issue with performing activities of daily living. A wheelchair will allow patient to safely perform daily activities. Patient can safely propel the wheelchair in the home or has a caregiver who can provide assistance. Length of need lifetime. Accessories: elevating leg rests (ELRs), wheel locks, extensions and anti-tippers.    All questions were answered. The patient knows to call the clinic with any problems questions or concerns.  cc Gayland Curry, MD    Return of visit: 1 week Thank you for this kind referral and the opportunity to participate in the care of this patient. A copy of today's note is routed to referring provider    [name], MD, PhD Hematology Oncology [place] at [facility]  07/21/2021

## 2021-07-21 NOTE — Patient Instructions (Signed)
[facility]  Discharge Instructions: Thank you for choosing [facility] to provide your oncology and hematology care.  If you have a lab appointment with the [facility], please go directly to the [facility] and check in at the registration area.  Wear comfortable clothing and clothing appropriate for easy access to any Portacath or PICC line.   We strive to give you quality time with your provider. You may need to reschedule your appointment if you arrive late (15 or more minutes).  Arriving late affects you and other patients whose appointments are after yours.  Also, if you miss three or more appointments without notifying the office, you may be dismissed from the clinic at the provider's discretion.      For prescription refill requests, have your pharmacy contact our office and allow 72 hours for refills to be completed.    Today you received the following chemotherapy and/or immunotherapy agents: Taxol      To help prevent nausea and vomiting after your treatment, we encourage you to take your nausea medication as directed.  BELOW ARE SYMPTOMS THAT SHOULD BE REPORTED IMMEDIATELY: *FEVER GREATER THAN 100.4 F (38 C) OR HIGHER *CHILLS OR SWEATING *NAUSEA AND VOMITING THAT IS NOT CONTROLLED WITH YOUR NAUSEA MEDICATION *UNUSUAL SHORTNESS OF BREATH *UNUSUAL BRUISING OR BLEEDING *URINARY PROBLEMS (pain or burning when urinating, or frequent urination) *BOWEL PROBLEMS (unusual diarrhea, constipation, pain near the anus) TENDERNESS IN MOUTH AND THROAT WITH OR WITHOUT PRESENCE OF ULCERS (sore throat, sores in mouth, or a toothache) UNUSUAL RASH, SWELLING OR PAIN  UNUSUAL VAGINAL DISCHARGE OR ITCHING   Items with * indicate a potential emergency and should be followed up as soon as possible or go to the Emergency Department if any problems should occur.  Please show the CHEMOTHERAPY ALERT CARD or IMMUNOTHERAPY ALERT CARD at check-in to  the Emergency Department and triage nurse.  Should you have questions after your visit or need to cancel or reschedule your appointment, please contact [facility]  [phone] and follow the prompts.  Office hours are 8:00 a.m. to 4:30 p.m. Monday - Friday. Please note that voicemails left after 4:00 p.m. may not be returned until the following business day.  We are closed weekends and major holidays. You have access to a nurse at all times for urgent questions. Please call the main number to the clinic [phone] and follow the prompts.  For any non-urgent questions, you may also contact your provider using MyChart. We now offer e-Visits for anyone 71 and older to request care online for non-urgent symptoms. For details visit [email or web address].   Also download the MyChart app! Go to the app store, search "MyChart", open the app, select [facility], and log in with your MyChart username and password.  Due to Covid, a mask is required upon entering the hospital/clinic. If you do not have a mask, one will be given to you upon arrival. For doctor visits, patients may have 1 support person aged 71 or older with them. For treatment visits, patients cannot have anyone with them due to current Covid guidelines and our immunocompromised population. Paclitaxel injection What is this medication? PACLITAXEL (PAK li TAX el) is a chemotherapy drug. It targets fast dividing cells, like cancer cells, and causes these cells to die. This medicine is used to treat ovarian cancer, breast cancer, lung cancer, Kaposi's sarcoma, and other cancers. This medicine may be used for other purposes; ask your health  care provider or pharmacist if you have questions. COMMON BRAND NAME(S): Onxol, Taxol What should I tell my care team before I take this medication? They need to know if you have any of these conditions: history of irregular heartbeat liver disease low blood counts, like  low white cell, platelet, or red cell counts lung or breathing disease, like asthma tingling of the fingers or toes, or other nerve disorder an unusual or allergic reaction to paclitaxel, alcohol, polyoxyethylated castor oil, other chemotherapy, other medicines, foods, dyes, or preservatives pregnant or trying to get pregnant breast-feeding How should I use this medication? This drug is given as an infusion into a vein. It is administered in a hospital or clinic by a specially trained health care professional. Talk to your pediatrician regarding the use of this medicine in children. Special care may be needed. Overdosage: If you think you have taken too much of this medicine contact a poison control center or emergency room at once. NOTE: This medicine is only for you. Do not share this medicine with others. What if I miss a dose? It is important not to miss your dose. Call your doctor or health care professional if you are unable to keep an appointment. What may interact with this medication? Do not take this medicine with any of the following medications: live virus vaccines This medicine may also interact with the following medications: antiviral medicines for hepatitis, HIV or AIDS certain antibiotics like erythromycin and clarithromycin certain medicines for fungal infections like ketoconazole and itraconazole certain medicines for seizures like carbamazepine, phenobarbital, phenytoin gemfibrozil nefazodone rifampin St. John's wort This list may not describe all possible interactions. Give your health care provider a list of all the medicines, herbs, non-prescription drugs, or dietary supplements you use. Also tell them if you smoke, drink alcohol, or use illegal drugs. Some items may interact with your medicine. What should I watch for while using this medication? Your condition will be monitored carefully while you are receiving this medicine. You will need important blood work done  while you are taking this medicine. This medicine can cause serious allergic reactions. To reduce your risk you will need to take other medicine(s) before treatment with this medicine. If you experience allergic reactions like skin rash, itching or hives, swelling of the face, lips, or tongue, tell your doctor or health care professional right away. In some cases, you may be given additional medicines to help with side effects. Follow all directions for their use. This drug may make you feel generally unwell. This is not uncommon, as chemotherapy can affect healthy cells as well as cancer cells. Report any side effects. Continue your course of treatment even though you feel ill unless your doctor tells you to stop. Call your doctor or health care professional for advice if you get a fever, chills or sore throat, or other symptoms of a cold or flu. Do not treat yourself. This drug decreases your body's ability to fight infections. Try to avoid being around people who are sick. This medicine may increase your risk to bruise or bleed. Call your doctor or health care professional if you notice any unusual bleeding. Be careful brushing and flossing your teeth or using a toothpick because you may get an infection or bleed more easily. If you have any dental work done, tell your dentist you are receiving this medicine. Avoid taking products that contain aspirin, acetaminophen, ibuprofen, naproxen, or ketoprofen unless instructed by your doctor. These medicines may hide a fever. Do  not become pregnant while taking this medicine. Women should inform their doctor if they wish to become pregnant or think they might be pregnant. There is a potential for serious side effects to an unborn child. Talk to your health care professional or pharmacist for more information. Do not breast-feed an infant while taking this medicine. Men are advised not to father a child while receiving this medicine. This product may contain  alcohol. Ask your pharmacist or healthcare provider if this medicine contains alcohol. Be sure to tell all healthcare providers you are taking this medicine. Certain medicines, like metronidazole and disulfiram, can cause an unpleasant reaction when taken with alcohol. The reaction includes flushing, headache, nausea, vomiting, sweating, and increased thirst. The reaction can last from 30 minutes to several hours. What side effects may I notice from receiving this medication? Side effects that you should report to your doctor or health care professional as soon as possible: allergic reactions like skin rash, itching or hives, swelling of the face, lips, or tongue breathing problems changes in vision fast, irregular heartbeat high or low blood pressure mouth sores pain, tingling, numbness in the hands or feet signs of decreased platelets or bleeding - bruising, pinpoint red spots on the skin, black, tarry stools, blood in the urine signs of decreased red blood cells - unusually weak or tired, feeling faint or lightheaded, falls signs of infection - fever or chills, cough, sore throat, pain or difficulty passing urine signs and symptoms of liver injury like dark yellow or brown urine; general ill feeling or flu-like symptoms; light-colored stools; loss of appetite; nausea; right upper belly pain; unusually weak or tired; yellowing of the eyes or skin swelling of the ankles, feet, hands unusually slow heartbeat Side effects that usually do not require medical attention (report to your doctor or health care professional if they continue or are bothersome): diarrhea hair loss loss of appetite muscle or joint pain nausea, vomiting pain, redness, or irritation at site where injected tiredness This list may not describe all possible side effects. Call your doctor for medical advice about side effects. You may report side effects to FDA at 1-800-FDA-1088. Where should I keep my medication? This drug  is given in a hospital or clinic and will not be stored at home. NOTE: This sheet is a summary. It may not cover all possible information. If you have questions about this medicine, talk to your doctor, pharmacist, or health care provider.  2022 [employer]vier/Gold Standard (2019-08-16 13:37:23)

## 2021-07-21 NOTE — Progress Notes (Cosign Needed)
    Durable Medical Equipment  (From admission, onward)           Start     Ordered   07/21/21 0000  For home use only DME standard manual wheelchair with seat cushion       Comments: Patient suffers from metastatic breast cancer which impairs their ability to perform daily activities like bathing, dressing, toileting in the home.  A walker will not resolve issue with performing activities of daily living. A wheelchair will allow patient to safely perform daily activities. Patient can safely propel the wheelchair in the home or has a caregiver who can provide assistance. Length of need lifetime. Accessories: elevating leg rests (ELRs), wheel locks, extensions and anti-tippers.   07/21/21 8830

## 2021-07-22 ENCOUNTER — Ambulatory Visit
Admit: 2021-07-22 | Discharge: 2021-07-22 | Disposition: A | Discharge status: Home / Self Care | Payer: Medicare PPO | Attending: Neurosurgery | Admitting: Neurosurgery

## 2021-07-22 ENCOUNTER — Encounter: Payer: Self-pay | Admitting: Oncology

## 2021-07-22 ENCOUNTER — Ambulatory Visit
Admission: RE | Admit: 2021-07-22 | Discharge: 2021-07-22 | Disposition: A | Discharge status: Home / Self Care | Payer: Medicare PPO | Source: Ambulatory Visit | Attending: Neurosurgery | Admitting: Neurosurgery

## 2021-07-22 ENCOUNTER — Ambulatory Visit
Admission: RE | Admit: 2021-07-22 | Discharge: 2021-07-22 | Disposition: A | Discharge status: Home / Self Care | Payer: Medicare PPO | Source: Ambulatory Visit | Attending: Radiation Oncology | Admitting: Radiation Oncology

## 2021-07-22 DIAGNOSIS — C7951 Secondary malignant neoplasm of bone: Secondary | ICD-10-CM | POA: Diagnosis not present

## 2021-07-22 IMAGING — MR MR THORACIC SPINE WO/W CM
6 of 9 series · 28 of 48 positions shown · IV contrast (gadavist)
Comparison: None available.

CLINICAL DATA: Initial evaluation for metastatic disease. History
of breast cancer.

EXAM:
MRI CERVICAL AND THORACIC SPINE WITHOUT AND WITH CONTRAST
TECHNIQUE: Multiplanar and multiecho pulse sequences of the cervical spine, to
include the craniocervical junction and cervicothoracic junction,
and the thoracic spine, were obtained without and with intravenous
contrast.
CONTRAST:  8mL GADAVIST GADOBUTROL 1 MMOL/ML IV SOLN

[Series 26: T1 · sagittal · 5.0mm · 1.88mm/px · 1 of 9 slices shown (1 of 3)]
[im 1/9]
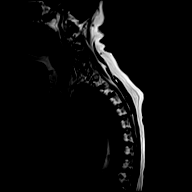

[Series 27: T2 · sagittal · 3.0mm · 1.06mm/px · 3 of 17 slices shown (1 of 2)]
[im 1/17]
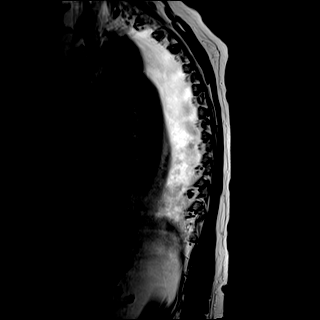
[im 9/17]
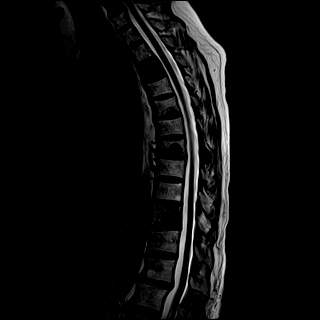
[im 17/17]
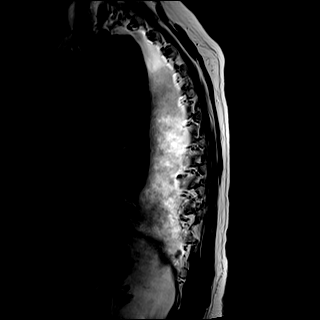

[Series 28: T1 · sagittal · 3.0mm · 1.06mm/px · 4 of 17 slices shown (2 of 3)]
[im 1/17]
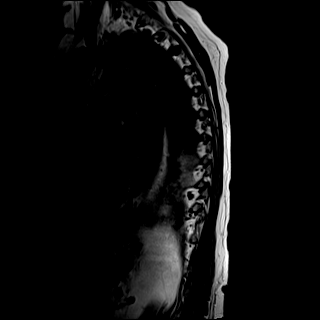
[im 6/17]
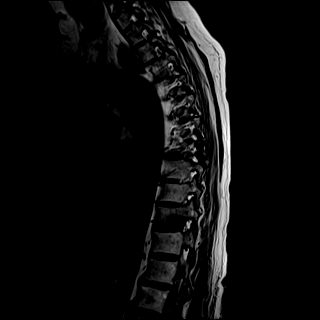
[im 11/17]
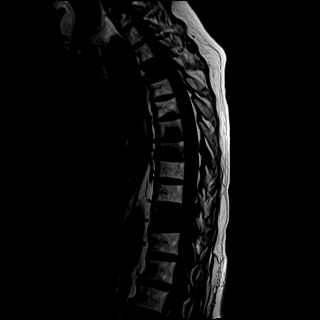
[im 17/17]
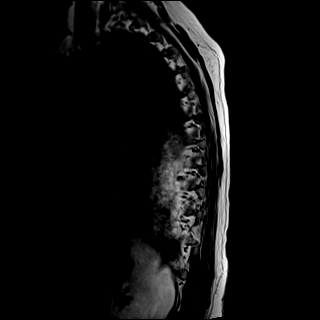

[Series 30: T2 · axial · 4.0mm · 0.59mm/px · z∈[-328,-103]mm · 8 of 39 slices shown (2 of 2)]
[im 1/39]
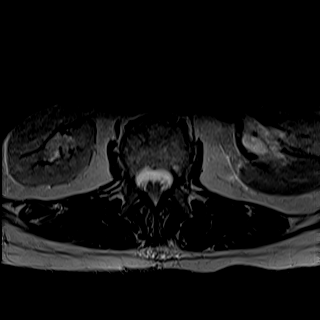
[im 6/39]
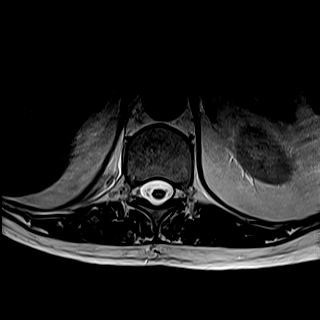
[im 11/39]
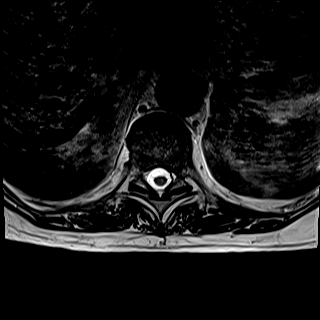
[im 17/39]
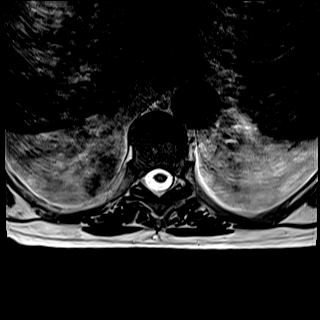
[im 22/39]
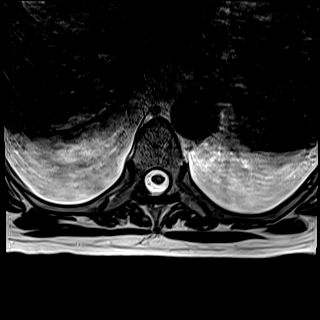
[im 28/39]
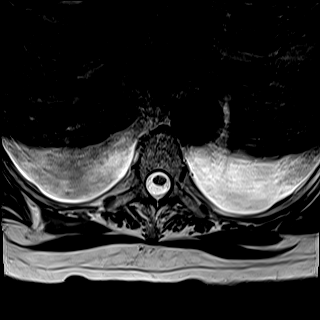
[im 33/39]
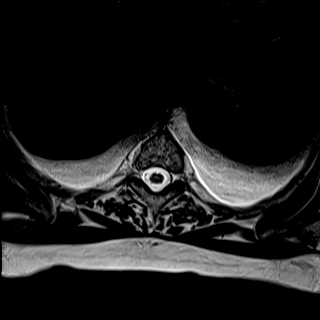
[im 39/39]
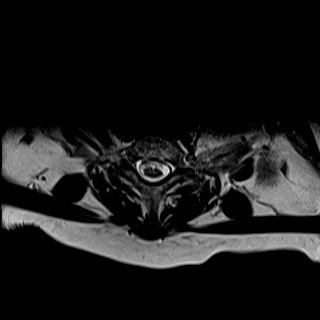

[Series 32: T1 · axial · non-contrast · 4.0mm · 0.37mm/px · z∈[-328,-103]mm · 8 of 39 slices shown (3 of 3)]
[im 1/39]
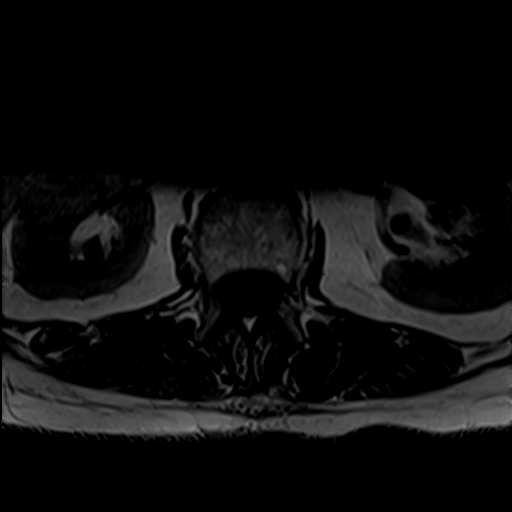
[im 6/39]
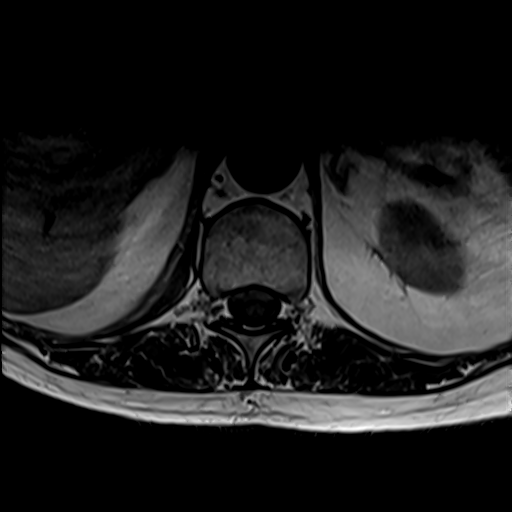
[im 11/39]
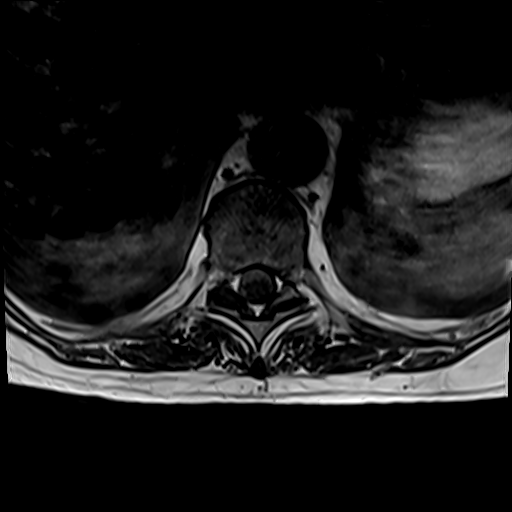
[im 17/39]
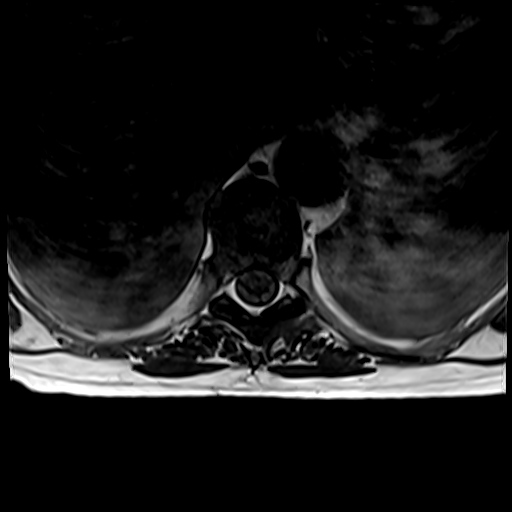
[im 22/39]
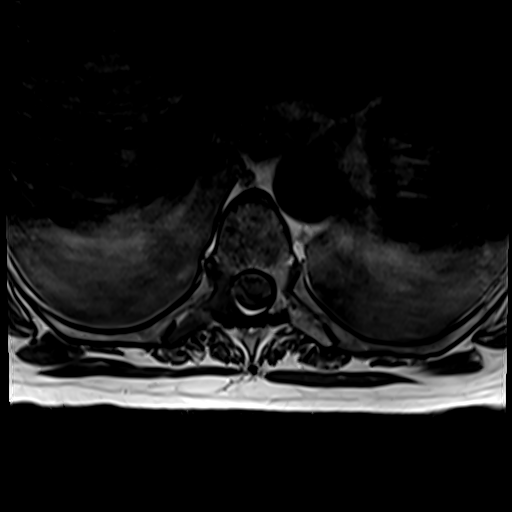
[im 28/39]
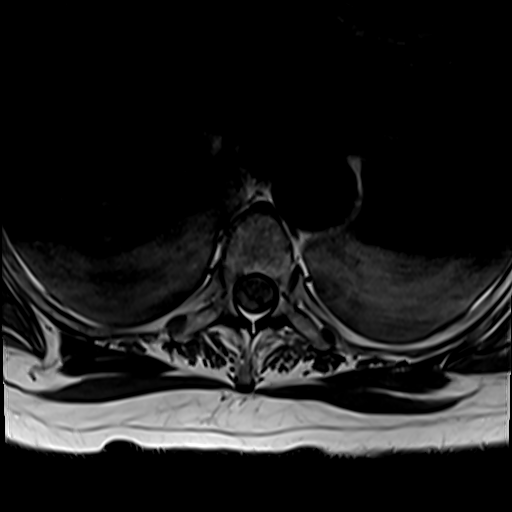
[im 33/39]
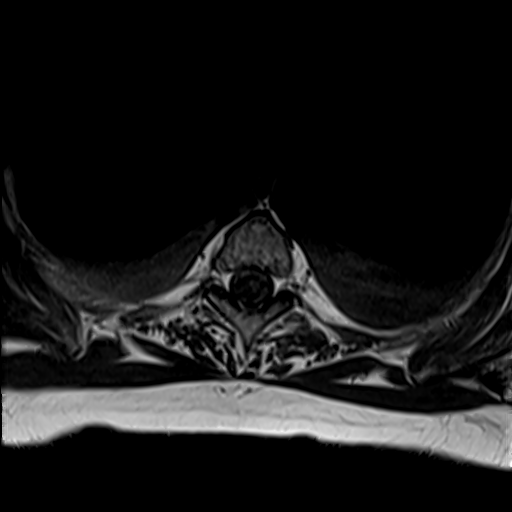
[im 39/39]
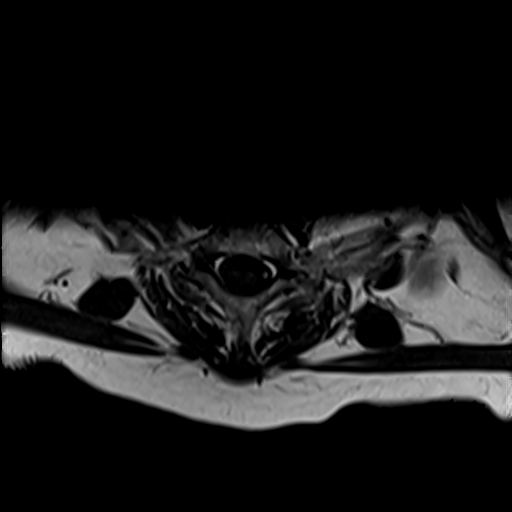

[Series 34: T1 fat-sat post-contrast · sagittal · 3.0mm · 1.06mm/px · 4 of 17 slices shown]
[im 1/17]
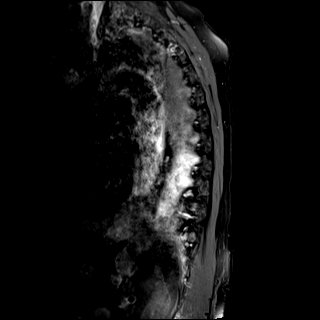
[im 6/17]
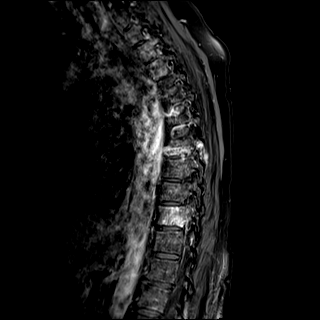
[im 11/17]
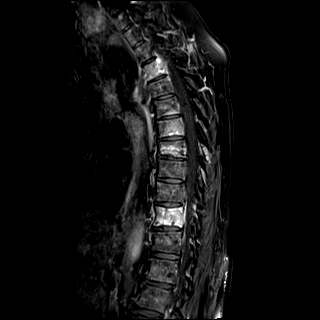
[im 17/17]
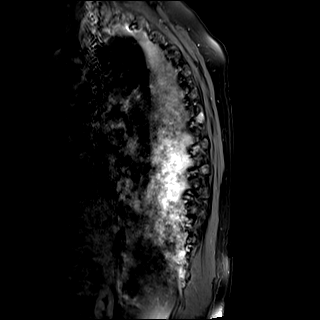

[28 of 48 positions shown; findings below may reference images not displayed]

FINDINGS: MRI CERVICAL SPINE FINDINGS

Alignment: Straightening of the normal cervical lordosis. No
listhesis.

Vertebrae: T1 hypointense, stir hyperintense enhancing lesion
largely replacing the C6 vertebral body, consistent with an osseous
metastatic implant. An additional separate implant seen involving
the C6 spinous process (series 35, image 7). No other metastatic
disease within the cervical spine itself, although note is made of
additional metastatic implants involving the upper thoracic spine at
the levels of T2 and T3, described on corresponding thoracic spine
portion of this report. No pathologic fracture or significant extra
osseous extension of tumor.

Bone marrow signal intensity otherwise normal. Vertebral body height
maintained. No other abnormal marrow edema or enhancement.

Cord: Normal signal and morphology. No epidural or intramedullary
tumor. No abnormal enhancement.

Posterior Fossa, vertebral arteries, paraspinal tissues: Visualized
brain and posterior fossa within normal limits. Craniocervical
junction normal. Paraspinous soft tissues within normal limits.
Normal flow voids seen within the vertebral arteries bilaterally.
Layering bilateral pleural effusions partially visualized.

Disc levels:

C2-C3: Mild uncovertebral spurring without significant disc bulge.
Mild facet hypertrophy on the right. No stenosis.

C3-C4: Disc bulge with endplate and uncovertebral spurring.
Bilateral facet hypertrophy. Mild to moderate spinal stenosis with
moderate left worse than right C4 foraminal narrowing.

C4-C5: Disc bulge with endplate and uncovertebral spurring, worse on
the right. Right-sided facet hypertrophy. Mild canal with moderate
right C5 foraminal stenosis.

C5-C6: Mild disc bulge with uncovertebral spurring. Mild to moderate
bilateral C6 foraminal narrowing, worse on the right. No spinal
stenosis.

C6-C7: Disc bulge with bilateral uncovertebral hypertrophy.
Flattening of the ventral thecal sac with borderline mild spinal
stenosis. Moderate to severe bilateral C7 foraminal narrowing, worse
on the left.

C7-T1:  Negative.

MRI THORACIC SPINE FINDINGS

Alignment: Straightening of the normal midthoracic kyphosis. No
listhesis.

Vertebrae: Multiple scattered osseous metastases seen throughout the
thoracic spine. There is involvement of most levels, with most
prominent lesions seen involving the T3, T5, T6, T7, and T10
vertebral bodies. Associated pathologic fracture at the inferior
endplate of T7 with mild height loss but no bony retropulsion.
Additional mild concavity about the T3 and T10 vertebral bodies also
suspicious for associated compression fractures. Mild extra osseous
extension about a lesion involving the spinous process of T2 (series
34, image 8). Probable early epidural extension into the right
neural foramina of T2-3 (series 27, image 6) and T9-10 (series 27,
image 4). No other significant extra osseous or epidural extension
at this time.

Cord: Normal signal and morphology. Probable early epidural
extension into the right neural foramina at T2-3 and T9-10 as above.
No other significant epidural involvement at this time. No
intramedullary tumor or abnormal enhancement.

Paraspinal and other soft tissues: 2.1 cm soft tissue implant noted
within the left posterior paraspinous soft tissues of the upper back
at the level of T1-2 (series 34, image 15). Additional probable
small soft tissue implant within the left posterior soft tissues at
the level of T12 (series 34, image 14). Large layering bilateral
pleural effusions noted.

Disc levels:

Underlying mild for age spondylosis. No significant disc bulge or
focal disc herniation. No significant stenosis.
IMPRESSION: MRI CERVICAL SPINE IMPRESSION:

1. Osseous metastatic disease involving the C6 vertebral body and C6
spinous process. No other evidence for metastatic disease within the
cervical spine. No extra osseous extension or epidural involvement.
2. Underlying cervical spondylosis with resultant mild to moderate
spinal stenosis at C3-4 and C4-5. Moderate bilateral C4 and right C5
foraminal narrowing, with moderate to severe bilateral C7 foraminal
stenosis.

MRI THORACIC SPINE IMPRESSION:

1. Diffuse osseous metastatic disease throughout the thoracic spine
as detailed above. Associated pathologic compression fracture of T7,
with additional probable mild compression fractures of T3 and T10.
2. Mild/early epidural extension into the right neural foramina at
T2-3 and T9-10. No other significant epidural tumor at this time.
3. Extra osseous extension tumor about a lesion involving the T2
spinous process.
4. Few soft tissue implants involving the left posterior paraspinous
soft tissues at the levels of T1-2 and T12 as above.
5. Large layering bilateral pleural effusions.

## 2021-07-22 IMAGING — MR MR CERVICAL SPINE WO/W CM
5 of 8 series · 26 of 48 positions shown · IV contrast (gadavist)
Comparison: None available.

CLINICAL DATA: Initial evaluation for metastatic disease. History
of breast cancer.

EXAM:
MRI CERVICAL AND THORACIC SPINE WITHOUT AND WITH CONTRAST
TECHNIQUE: Multiplanar and multiecho pulse sequences of the cervical spine, to
include the craniocervical junction and cervicothoracic junction,
and the thoracic spine, were obtained without and with intravenous
contrast.
CONTRAST:  8mL GADAVIST GADOBUTROL 1 MMOL/ML IV SOLN

[Series 5: T2 · sagittal · 3.0mm · 0.62mm/px · 4 of 15 slices shown (1 of 2)]
[im 1/15]
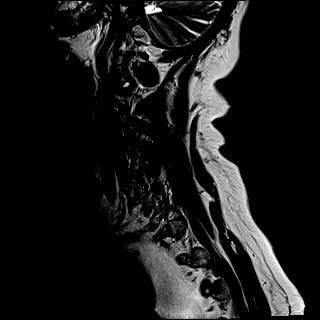
[im 5/15]
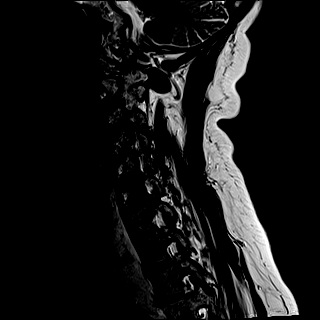
[im 10/15]
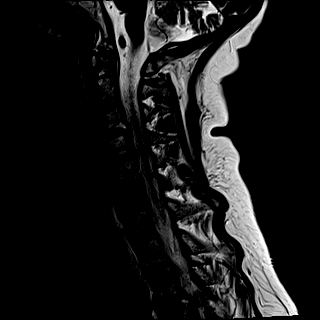
[im 15/15]
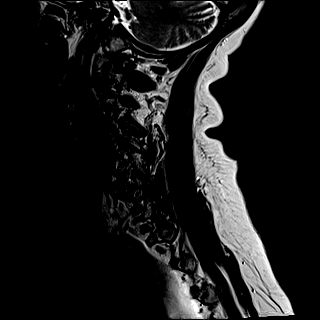

[Series 7: STIR · sagittal · 3.0mm · 0.62mm/px · 4 of 15 slices shown]
[im 1/15]
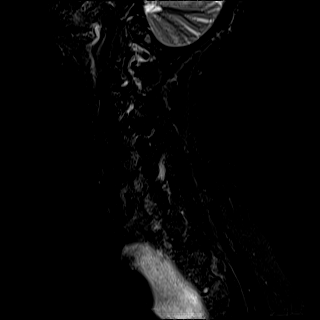
[im 5/15]
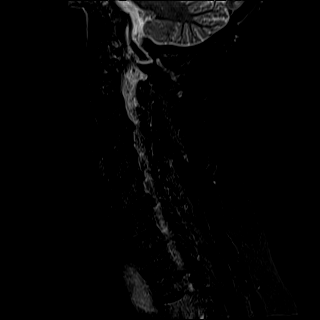
[im 10/15]
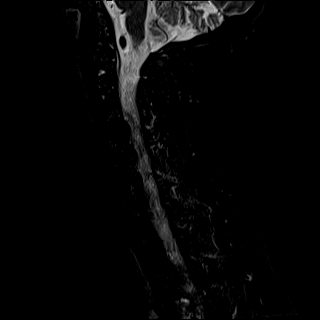
[im 15/15]
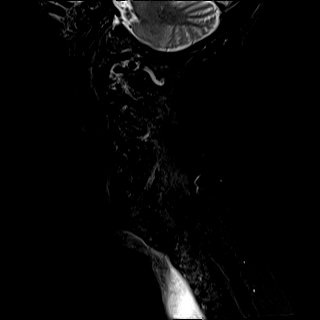

[Series 8: T2 · axial · 3.0mm · 0.70mm/px · z∈[-106,-9]mm · 8 of 29 slices shown (2 of 2)]
[im 1/29]
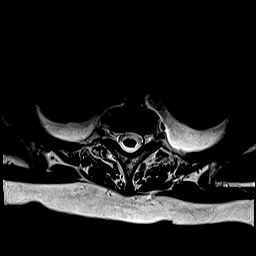
[im 5/29]
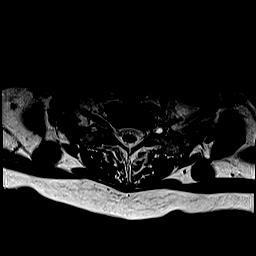
[im 9/29]
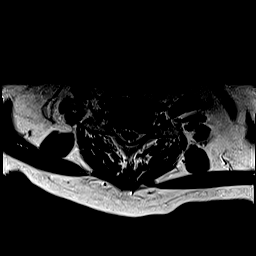
[im 13/29]
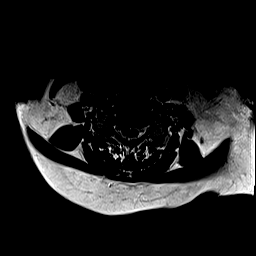
[im 17/29]
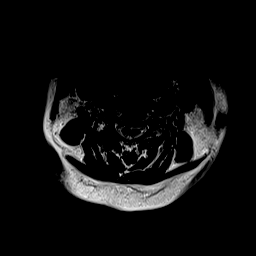
[im 21/29]
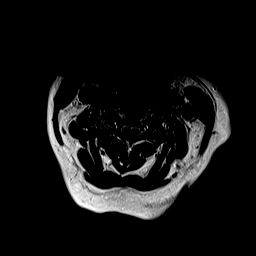
[im 25/29]
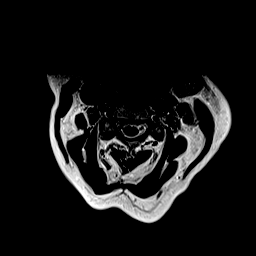
[im 29/29]
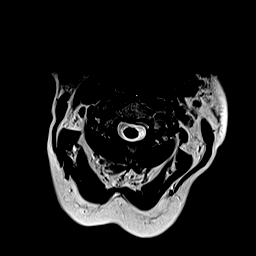

[Series 10: T1 · axial · non-contrast · 3.0mm · 0.35mm/px · z∈[-106,-9]mm · 8 of 29 slices shown]
[im 1/29]
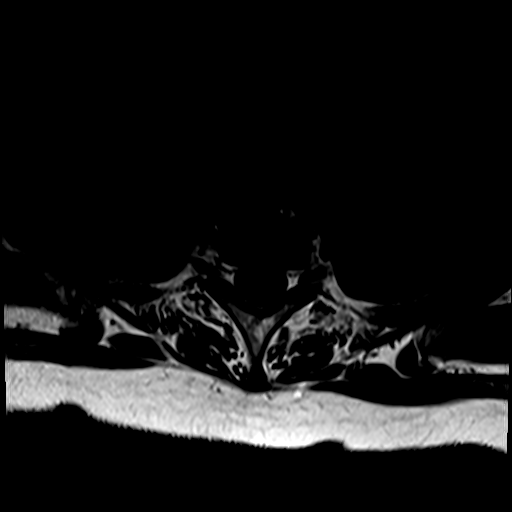
[im 5/29]
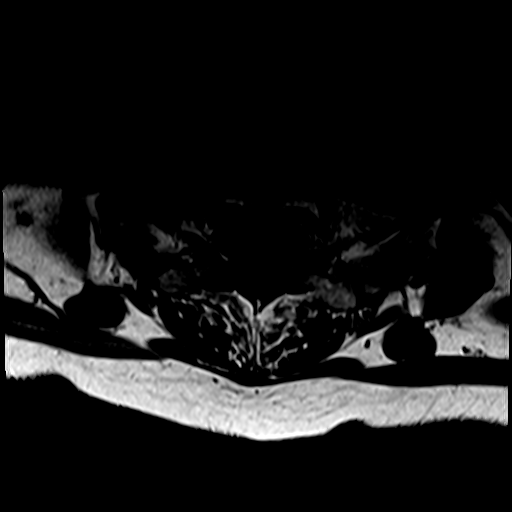
[im 9/29]
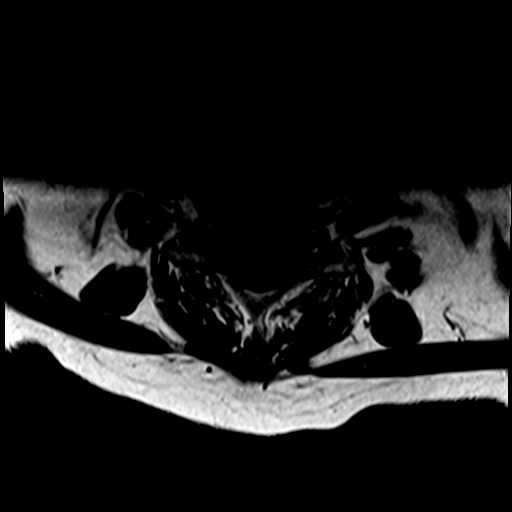
[im 13/29]
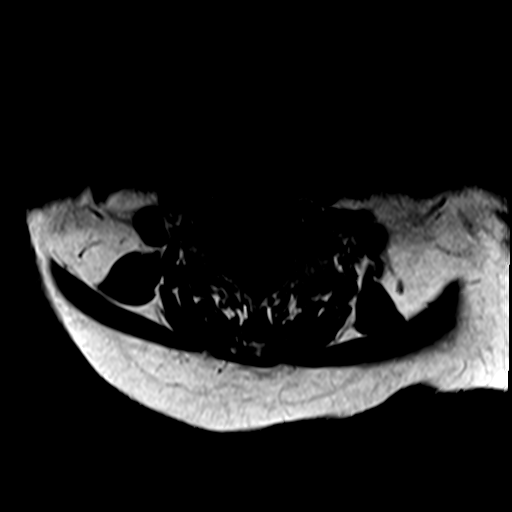
[im 17/29]
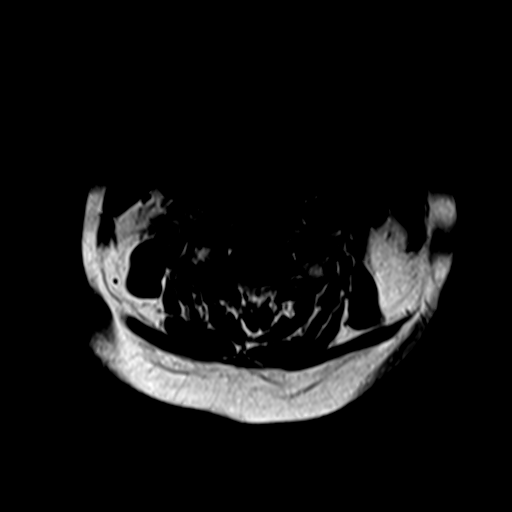
[im 21/29]
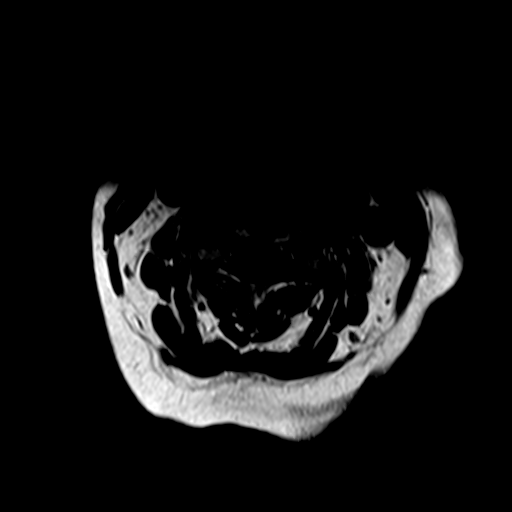
[im 25/29]
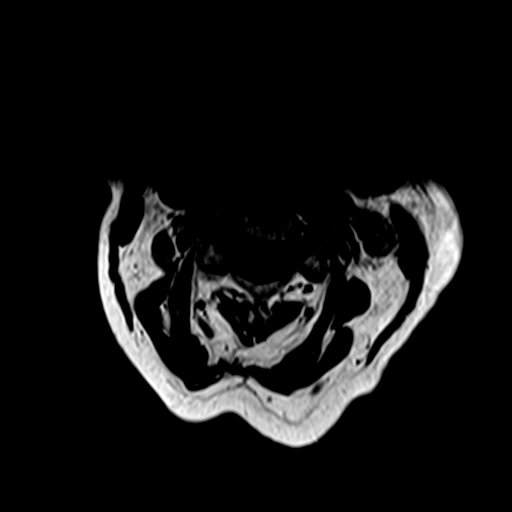
[im 29/29]
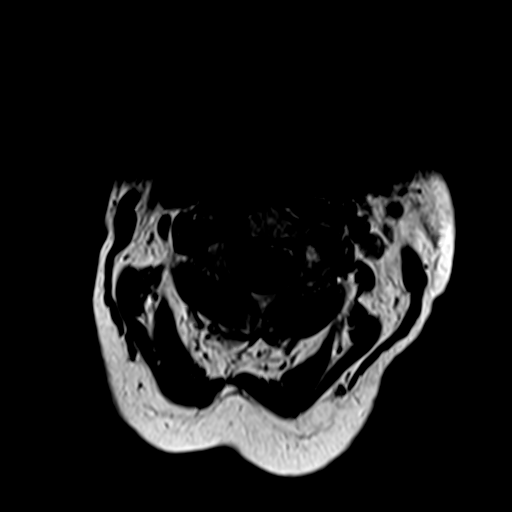

[Series 36: T1 post-contrast · axial · 3.0mm · 0.35mm/px · z∈[-106,-92]mm · 2 of 29 slices shown]
[im 1/29]
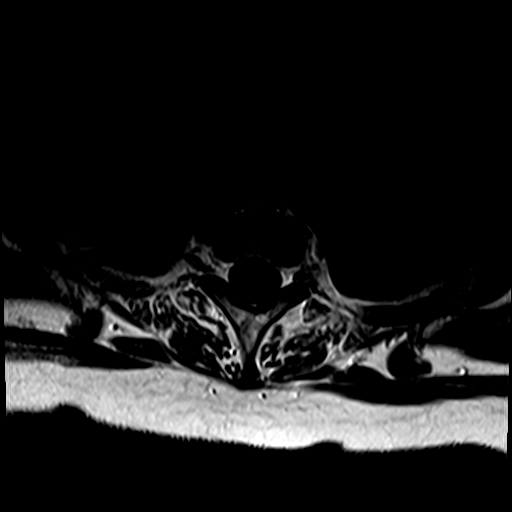
[im 5/29]
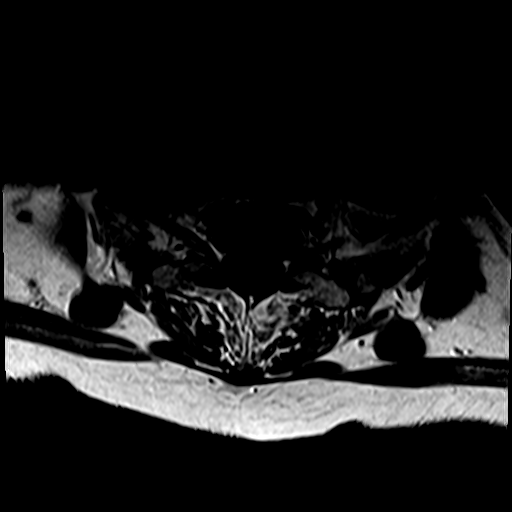

[26 of 48 positions shown; findings below may reference images not displayed]

FINDINGS: MRI CERVICAL SPINE FINDINGS

Alignment: Straightening of the normal cervical lordosis. No
listhesis.

Vertebrae: T1 hypointense, stir hyperintense enhancing lesion
largely replacing the C6 vertebral body, consistent with an osseous
metastatic implant. An additional separate implant seen involving
the C6 spinous process (series 35, image 7). No other metastatic
disease within the cervical spine itself, although note is made of
additional metastatic implants involving the upper thoracic spine at
the levels of T2 and T3, described on corresponding thoracic spine
portion of this report. No pathologic fracture or significant extra
osseous extension of tumor.

Bone marrow signal intensity otherwise normal. Vertebral body height
maintained. No other abnormal marrow edema or enhancement.

Cord: Normal signal and morphology. No epidural or intramedullary
tumor. No abnormal enhancement.

Posterior Fossa, vertebral arteries, paraspinal tissues: Visualized
brain and posterior fossa within normal limits. Craniocervical
junction normal. Paraspinous soft tissues within normal limits.
Normal flow voids seen within the vertebral arteries bilaterally.
Layering bilateral pleural effusions partially visualized.

Disc levels:

C2-C3: Mild uncovertebral spurring without significant disc bulge.
Mild facet hypertrophy on the right. No stenosis.

C3-C4: Disc bulge with endplate and uncovertebral spurring.
Bilateral facet hypertrophy. Mild to moderate spinal stenosis with
moderate left worse than right C4 foraminal narrowing.

C4-C5: Disc bulge with endplate and uncovertebral spurring, worse on
the right. Right-sided facet hypertrophy. Mild canal with moderate
right C5 foraminal stenosis.

C5-C6: Mild disc bulge with uncovertebral spurring. Mild to moderate
bilateral C6 foraminal narrowing, worse on the right. No spinal
stenosis.

C6-C7: Disc bulge with bilateral uncovertebral hypertrophy.
Flattening of the ventral thecal sac with borderline mild spinal
stenosis. Moderate to severe bilateral C7 foraminal narrowing, worse
on the left.

C7-T1:  Negative.

MRI THORACIC SPINE FINDINGS

Alignment: Straightening of the normal midthoracic kyphosis. No
listhesis.

Vertebrae: Multiple scattered osseous metastases seen throughout the
thoracic spine. There is involvement of most levels, with most
prominent lesions seen involving the T3, T5, T6, T7, and T10
vertebral bodies. Associated pathologic fracture at the inferior
endplate of T7 with mild height loss but no bony retropulsion.
Additional mild concavity about the T3 and T10 vertebral bodies also
suspicious for associated compression fractures. Mild extra osseous
extension about a lesion involving the spinous process of T2 (series
34, image 8). Probable early epidural extension into the right
neural foramina of T2-3 (series 27, image 6) and T9-10 (series 27,
image 4). No other significant extra osseous or epidural extension
at this time.

Cord: Normal signal and morphology. Probable early epidural
extension into the right neural foramina at T2-3 and T9-10 as above.
No other significant epidural involvement at this time. No
intramedullary tumor or abnormal enhancement.

Paraspinal and other soft tissues: 2.1 cm soft tissue implant noted
within the left posterior paraspinous soft tissues of the upper back
at the level of T1-2 (series 34, image 15). Additional probable
small soft tissue implant within the left posterior soft tissues at
the level of T12 (series 34, image 14). Large layering bilateral
pleural effusions noted.

Disc levels:

Underlying mild for age spondylosis. No significant disc bulge or
focal disc herniation. No significant stenosis.
IMPRESSION: MRI CERVICAL SPINE IMPRESSION:

1. Osseous metastatic disease involving the C6 vertebral body and C6
spinous process. No other evidence for metastatic disease within the
cervical spine. No extra osseous extension or epidural involvement.
2. Underlying cervical spondylosis with resultant mild to moderate
spinal stenosis at C3-4 and C4-5. Moderate bilateral C4 and right C5
foraminal narrowing, with moderate to severe bilateral C7 foraminal
stenosis.

MRI THORACIC SPINE IMPRESSION:

1. Diffuse osseous metastatic disease throughout the thoracic spine
as detailed above. Associated pathologic compression fracture of T7,
with additional probable mild compression fractures of T3 and T10.
2. Mild/early epidural extension into the right neural foramina at
T2-3 and T9-10. No other significant epidural tumor at this time.
3. Extra osseous extension tumor about a lesion involving the T2
spinous process.
4. Few soft tissue implants involving the left posterior paraspinous
soft tissues at the levels of T1-2 and T12 as above.
5. Large layering bilateral pleural effusions.

## 2021-07-22 MED ORDER — GADOBUTROL 1 MMOL/ML IV SOLN
8.0000 mL | Freq: Once | INTRAVENOUS | Status: AC | PRN
  Administered 2021-07-22: 8 mL via INTRAVENOUS

## 2021-07-23 ENCOUNTER — Ambulatory Visit
Admission: RE | Admit: 2021-07-23 | Discharge: 2021-07-23 | Disposition: A | Discharge status: Home / Self Care | Payer: Medicare PPO | Source: Ambulatory Visit | Attending: Radiation Oncology | Admitting: Radiation Oncology

## 2021-07-23 ENCOUNTER — Ambulatory Visit: Payer: Medicare PPO | Admitting: Oncology

## 2021-07-23 ENCOUNTER — Encounter: Payer: Self-pay | Admitting: Oncology

## 2021-07-23 DIAGNOSIS — Z510 Encounter for antineoplastic radiation therapy: Secondary | ICD-10-CM | POA: Diagnosis not present

## 2021-07-24 ENCOUNTER — Ambulatory Visit
Admission: RE | Admit: 2021-07-24 | Discharge: 2021-07-24 | Disposition: A | Discharge status: Home / Self Care | Payer: Medicare PPO | Source: Ambulatory Visit | Attending: Radiation Oncology | Admitting: Radiation Oncology

## 2021-07-24 ENCOUNTER — Ambulatory Visit: Payer: Self-pay | Admitting: General Surgery

## 2021-07-24 ENCOUNTER — Telehealth: Payer: Self-pay

## 2021-07-24 ENCOUNTER — Other Ambulatory Visit: Payer: Self-pay

## 2021-07-24 DIAGNOSIS — C50919 Malignant neoplasm of unspecified site of unspecified female breast: Secondary | ICD-10-CM

## 2021-07-24 DIAGNOSIS — Z510 Encounter for antineoplastic radiation therapy: Secondary | ICD-10-CM | POA: Diagnosis not present

## 2021-07-24 DIAGNOSIS — J90 Pleural effusion, not elsewhere classified: Secondary | ICD-10-CM

## 2021-07-24 LAB — CYTOLOGY - NON PAP

## 2021-07-24 NOTE — Telephone Encounter (Signed)
Thoracentesis scheduled for 10/28 @ 2:30, pt informed. Per Dr. Tasia Catchings, Dr. Peyton Najjar plans to do port placement on 11/4, radiation time has changed to the morning. Sent pt mychart message to notify her of radiation time change on 11/4.

## 2021-07-24 NOTE — Telephone Encounter (Signed)
Pt sent Mychart message requesting to be set up for thoracentesis and for port placement. Request for Thoracentesis faxed to specials scheduling. Referral for port placement sent to Dr. Deniece Ree office.

## 2021-07-25 ENCOUNTER — Ambulatory Visit
Admission: RE | Admit: 2021-07-25 | Discharge: 2021-07-25 | Disposition: A | Discharge status: Home / Self Care | Payer: Medicare PPO | Source: Ambulatory Visit | Attending: Interventional Radiology | Admitting: Interventional Radiology

## 2021-07-25 ENCOUNTER — Other Ambulatory Visit: Payer: Self-pay | Admitting: Interventional Radiology

## 2021-07-25 ENCOUNTER — Ambulatory Visit
Admission: RE | Admit: 2021-07-25 | Discharge: 2021-07-25 | Disposition: A | Discharge status: Home / Self Care | Payer: Medicare PPO | Source: Ambulatory Visit | Attending: Oncology | Admitting: Oncology

## 2021-07-25 ENCOUNTER — Other Ambulatory Visit: Payer: Self-pay

## 2021-07-25 ENCOUNTER — Ambulatory Visit
Admission: RE | Admit: 2021-07-25 | Discharge: 2021-07-25 | Disposition: A | Discharge status: Home / Self Care | Payer: Medicare PPO | Source: Ambulatory Visit | Attending: Radiation Oncology | Admitting: Radiation Oncology

## 2021-07-25 DIAGNOSIS — I6782 Cerebral ischemia: Secondary | ICD-10-CM | POA: Diagnosis not present

## 2021-07-25 DIAGNOSIS — J90 Pleural effusion, not elsewhere classified: Secondary | ICD-10-CM | POA: Insufficient documentation

## 2021-07-25 DIAGNOSIS — Z9889 Other specified postprocedural states: Secondary | ICD-10-CM

## 2021-07-25 DIAGNOSIS — Z853 Personal history of malignant neoplasm of breast: Secondary | ICD-10-CM | POA: Insufficient documentation

## 2021-07-25 DIAGNOSIS — Z510 Encounter for antineoplastic radiation therapy: Secondary | ICD-10-CM | POA: Diagnosis not present

## 2021-07-25 IMAGING — DX DG CHEST 1V PORT
1 series · 1 of 1 positions shown · non-contrast
Comparison: [DATE]

CLINICAL DATA: Status post right-sided thoracentesis, remote
history of breast cancer

EXAM:
PORTABLE CHEST 1 VIEW

[chest ap]
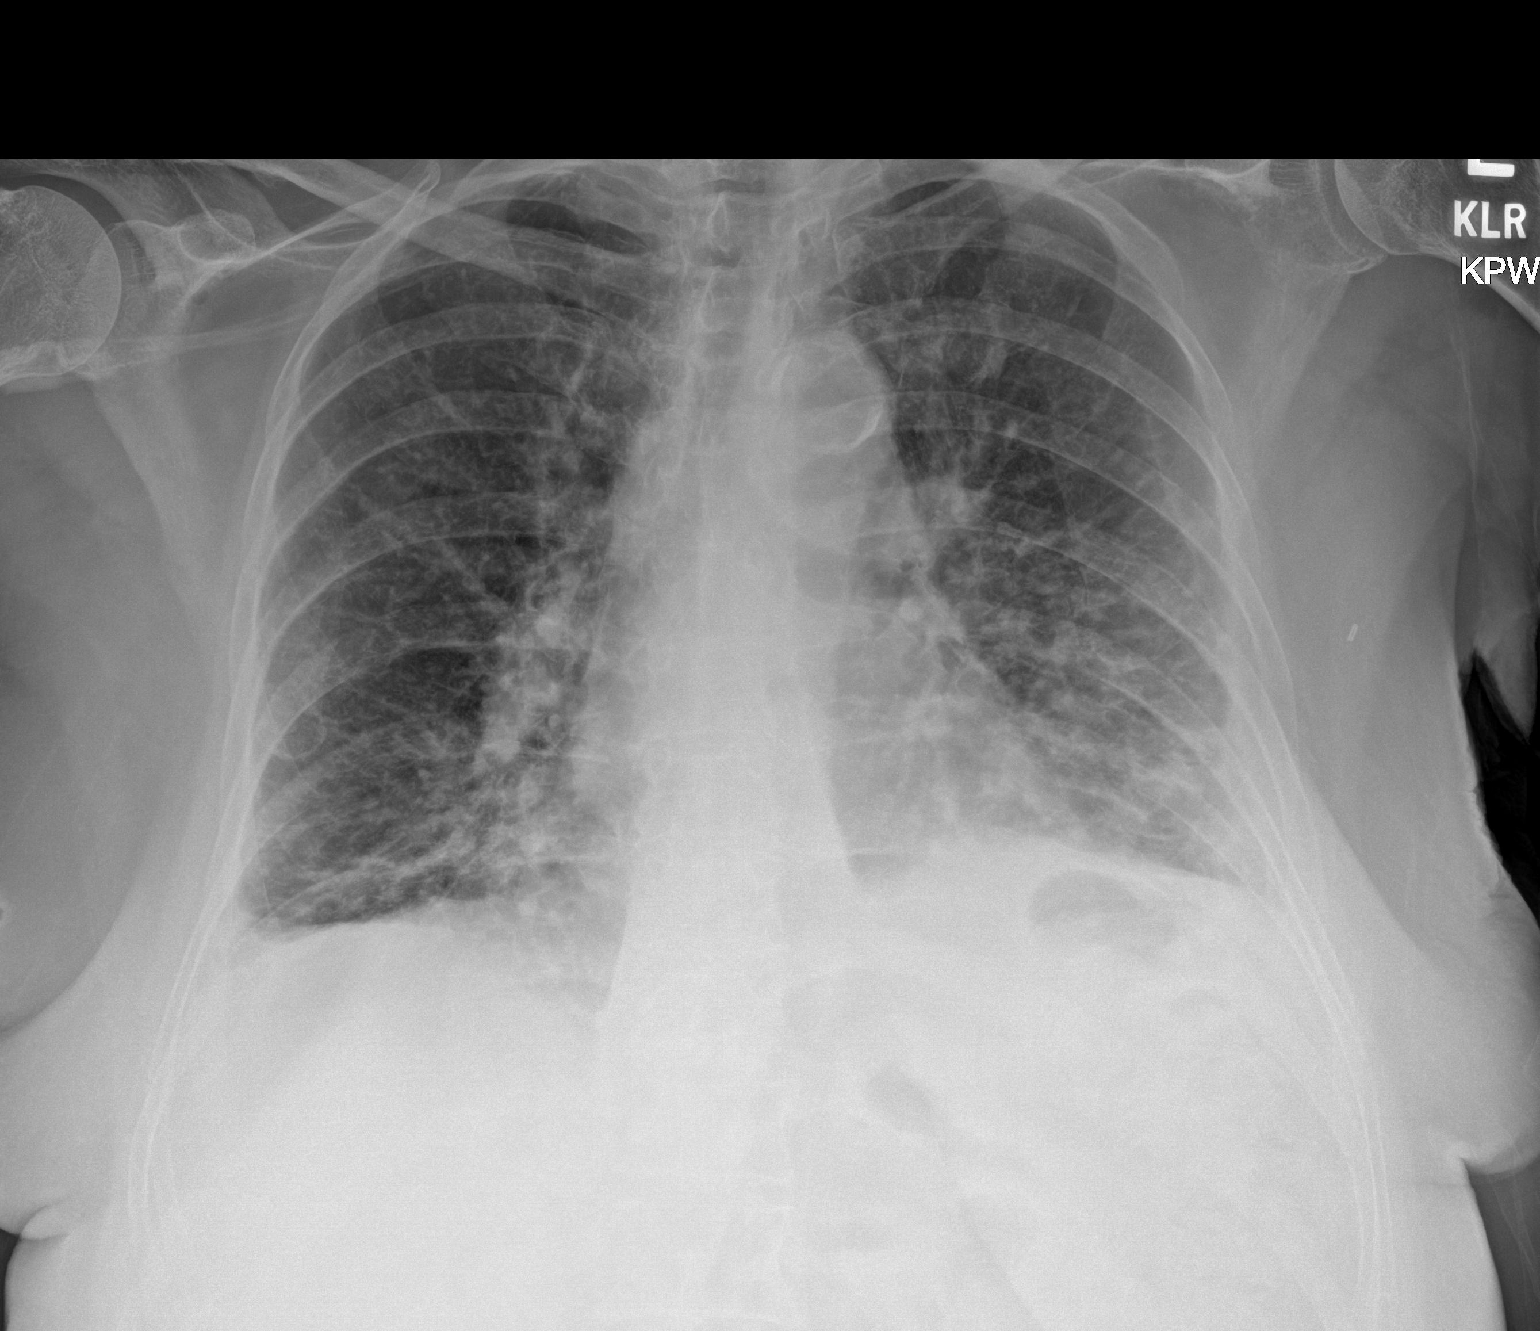

[1 of 1 positions shown; findings below may reference images not displayed]

FINDINGS: Single frontal view of the chest demonstrates a stable cardiac
silhouette. There are trace bilateral pleural effusions. No evidence
of pneumothorax. Bronchovascular prominence with patchy bibasilar
consolidation unchanged. No acute bony abnormalities.
IMPRESSION: 1. Trace bilateral pleural effusions.
2. No evidence of pneumothorax.
3. Stable bronchovascular prominence and bibasilar consolidation.

## 2021-07-25 IMAGING — US US THORACENTESIS ASP PLEURAL SPACE W/IMG GUIDE
1 series · 6 of 6 positions shown · non-contrast
Comparison: none

INDICATION: EXAM:
ULTRASOUND GUIDED RIGHT THORACENTESIS

[Series 1: us thoracentesis asp pleural space w/img guide · 0.13mm/px · 6 of 6 slices shown]
[im 1/6]
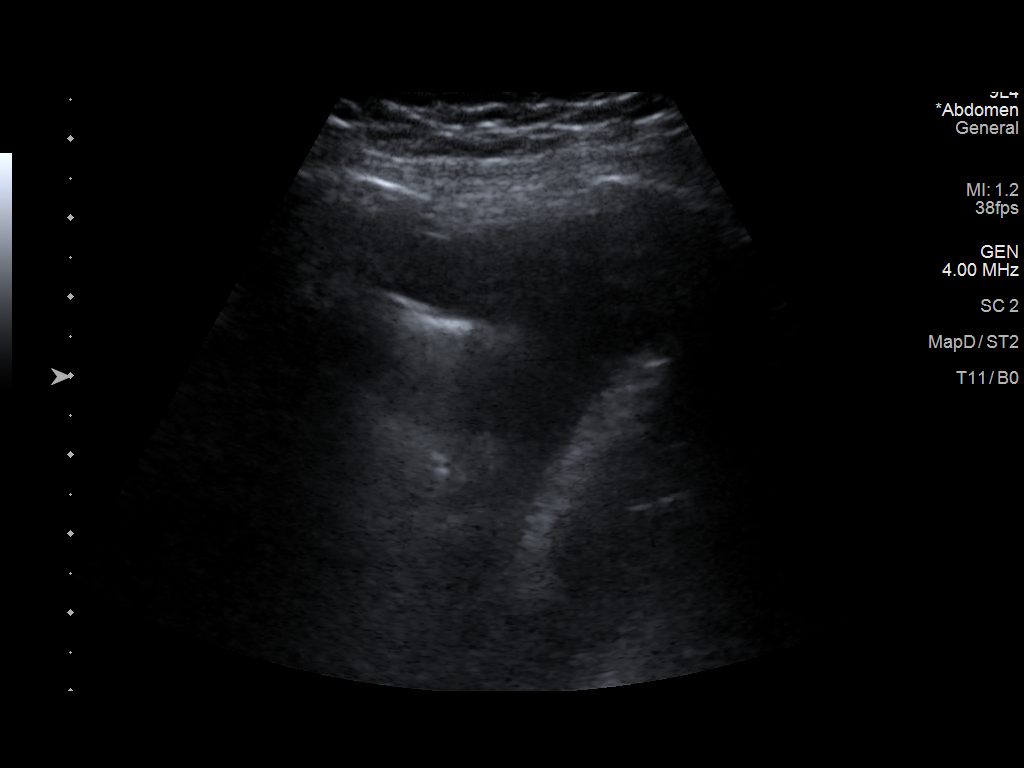
[im 2/6]
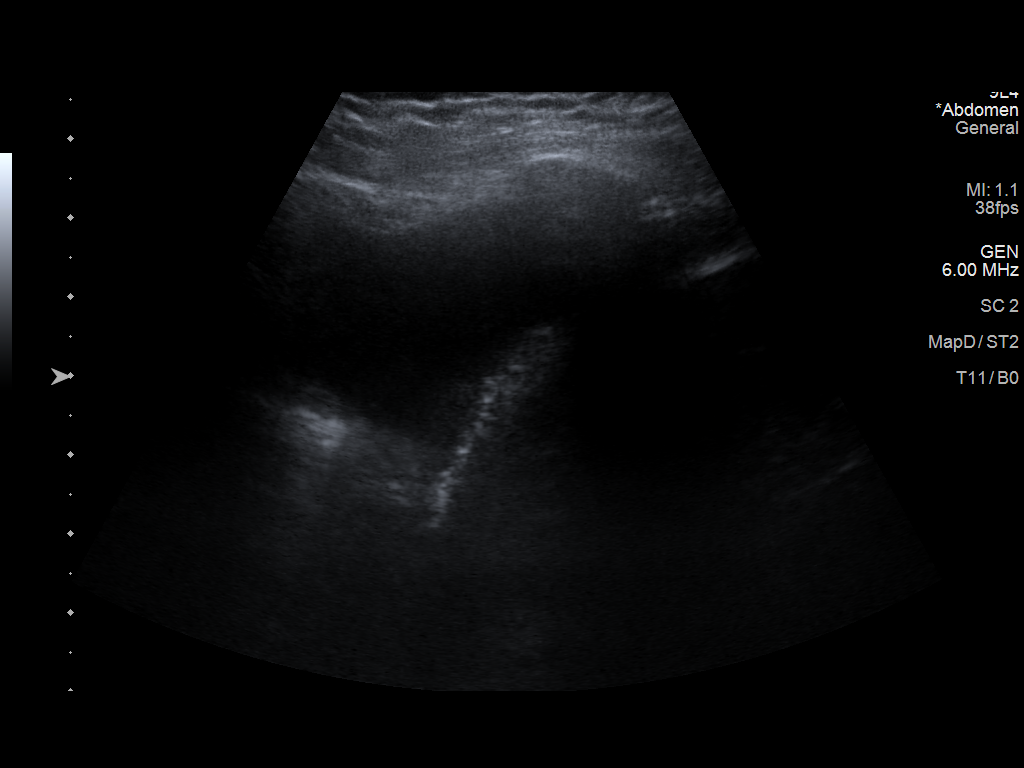
[im 3/6]
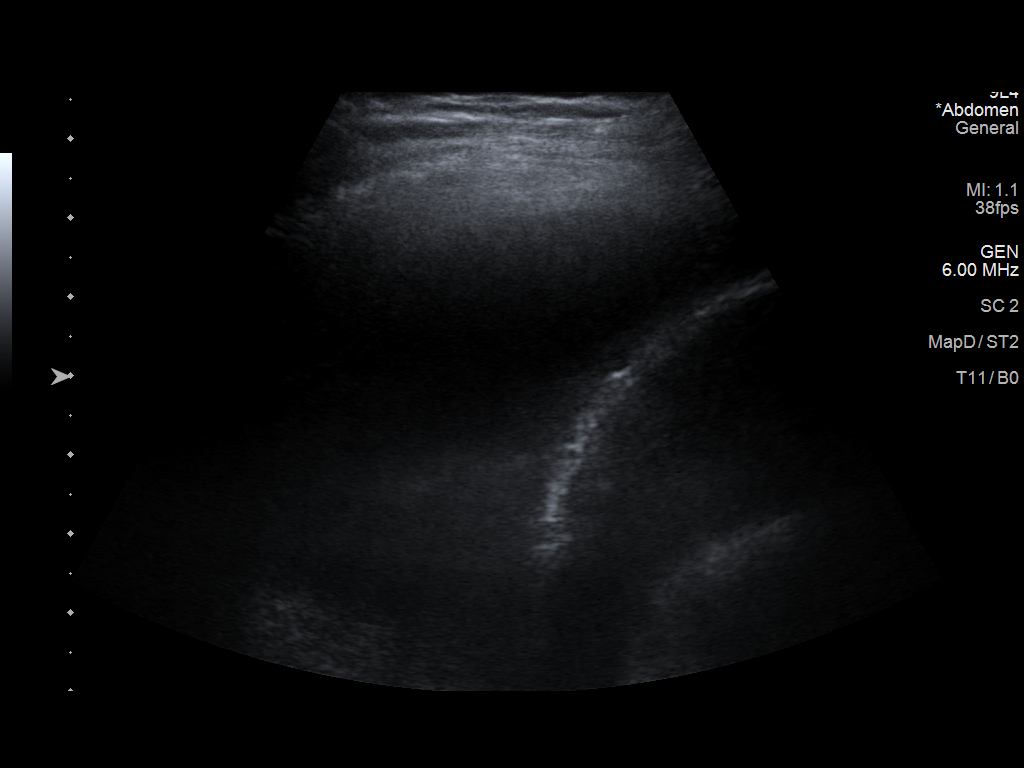
[im 4/6]
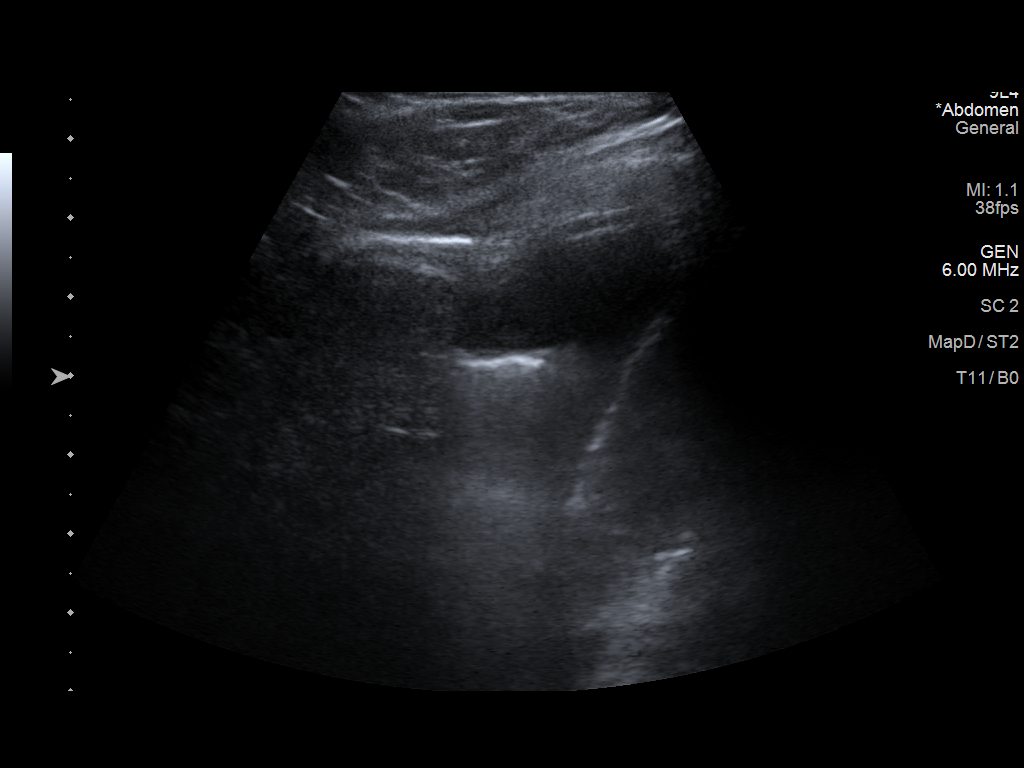
[im 5/6]
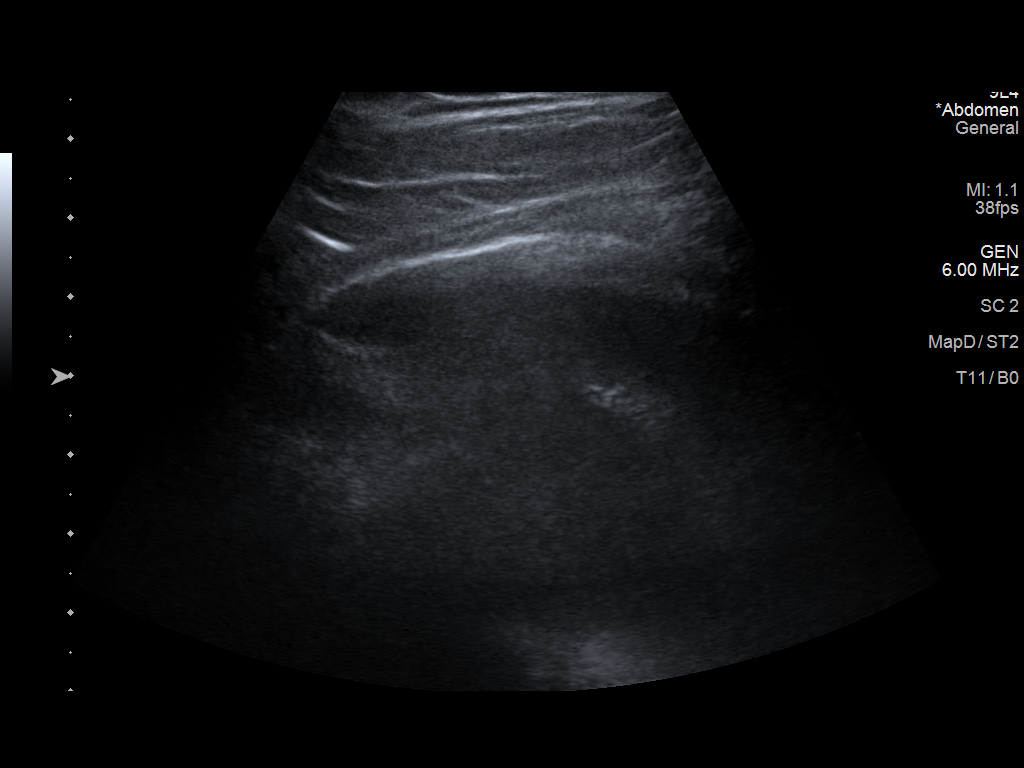
[im 6/6]
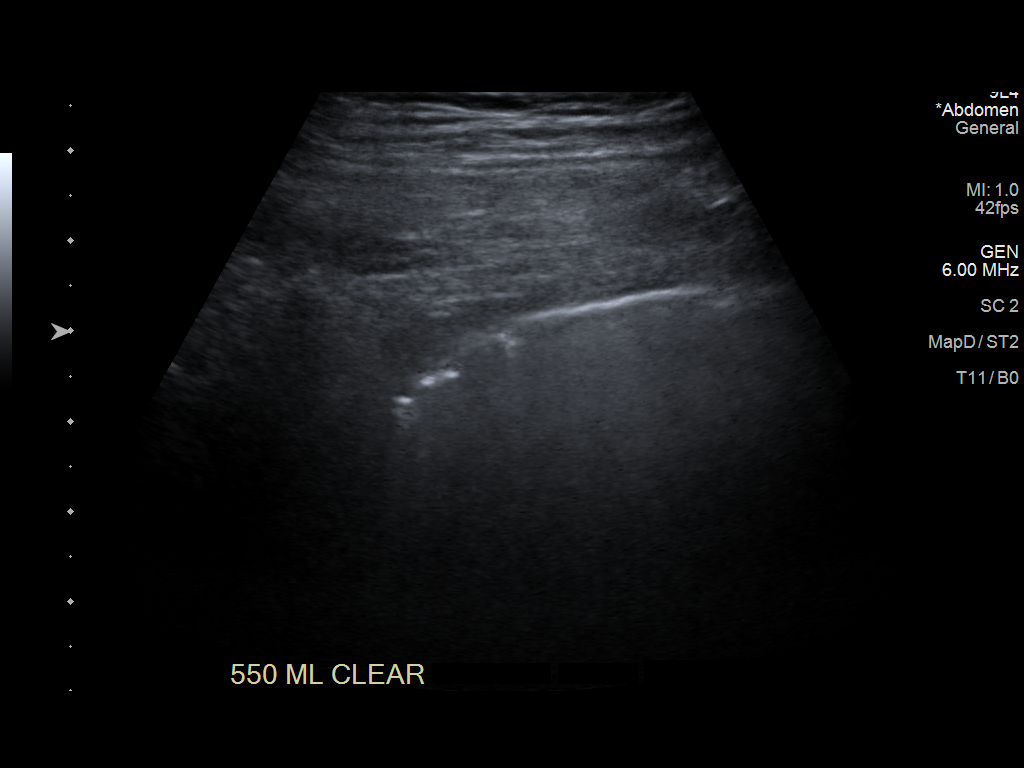

[6 of 6 positions shown; findings below may reference images not displayed]

MEDICATIONS:
None.

COMPLICATIONS:
None immediate.

PROCEDURE:
An ultrasound guided thoracentesis was thoroughly discussed with the
patient and questions answered. The benefits, risks, alternatives
and complications were also discussed. The patient understands and
wishes to proceed with the procedure. Written consent was obtained.

Ultrasound was performed to localize and mark an adequate pocket of
fluid in the right chest. The area was then prepped and draped in
the normal sterile fashion. 1% Lidocaine was used for local
anesthesia. Under ultrasound guidance a 19 gauge Yueh catheter was
introduced. Thoracentesis was performed. The catheter was removed
and a dressing applied.
FINDINGS: A total of approximately 550 mL of clear straw-colored fluid was
removed.
IMPRESSION: Successful ultrasound guided right thoracentesis yielding 550 mL of
clear straw-colored pleural fluid.

## 2021-07-26 ENCOUNTER — Ambulatory Visit
Admission: RE | Admit: 2021-07-26 | Discharge: 2021-07-26 | Disposition: A | Discharge status: Home / Self Care | Payer: Medicare PPO | Source: Ambulatory Visit | Attending: Oncology | Admitting: Oncology

## 2021-07-26 DIAGNOSIS — Z853 Personal history of malignant neoplasm of breast: Secondary | ICD-10-CM

## 2021-07-26 DIAGNOSIS — J90 Pleural effusion, not elsewhere classified: Secondary | ICD-10-CM | POA: Diagnosis not present

## 2021-07-26 IMAGING — MR MR HEAD WO/W CM
14 series · 48 of 48 positions shown · IV contrast (gadavist)
Comparison: None.

CLINICAL DATA: Breast cancer.

EXAM:
MRI HEAD WITHOUT AND WITH CONTRAST
TECHNIQUE: Multiplanar, multiecho pulse sequences of the brain and surrounding
structures were obtained without and with intravenous contrast.
CONTRAST:  3mL GADAVIST GADOBUTROL 1 MMOL/ML IV SOLN
Adequate IV access could not be obtained despite assistance from the
IV team. A portion of the planned contrast volume was able to be
injected through a butterfly needle before the vein ruptured.

[Series 5: ax dwi_tracew · axial · 3.0mm · 0.65mm/px · z∈[-93,+61]mm · 2 of 48 slices shown]
[im 1/48]
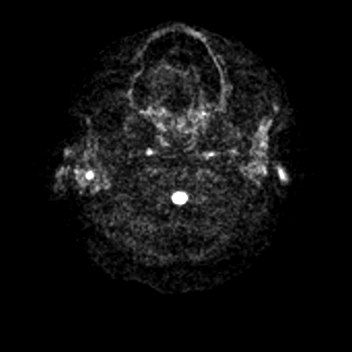
[im 48/48]
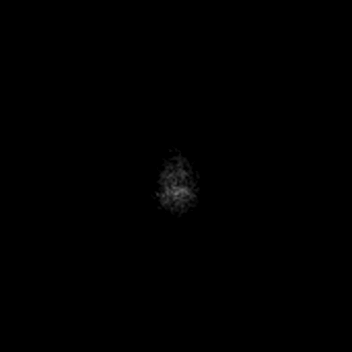

[Series 6: ax dwi_adc · axial · 3.0mm · 0.65mm/px · z∈[-93,+61]mm · 3 of 48 slices shown]
[im 1/48]
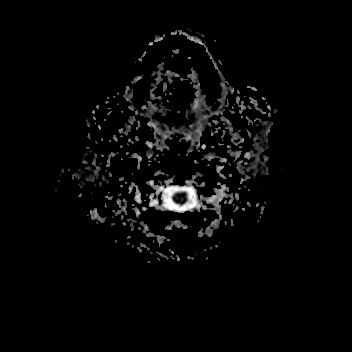
[im 24/48]
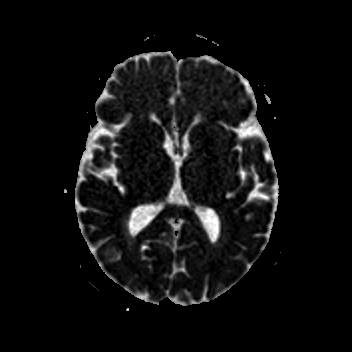
[im 48/48]
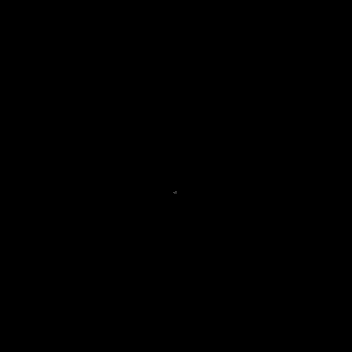

[Series 7: cor dwi_tracew · coronal · 5.0mm · 0.60mm/px · 2 of 38 slices shown]
[im 1/38]
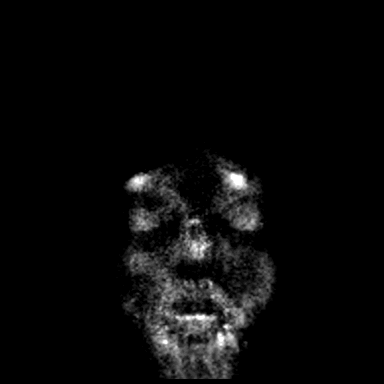
[im 38/38]
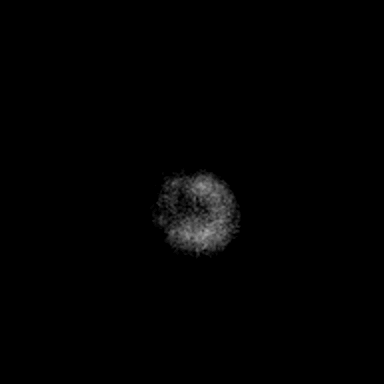

[Series 8: cor dwi_adc · coronal · 5.0mm · 0.60mm/px · 2 of 36 slices shown]
[im 1/36]
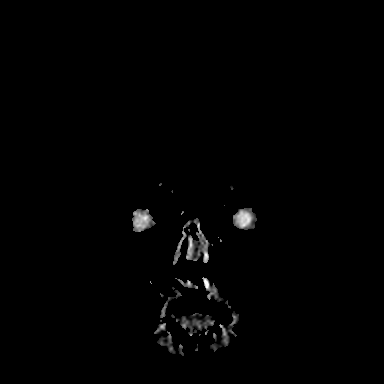
[im 36/36]
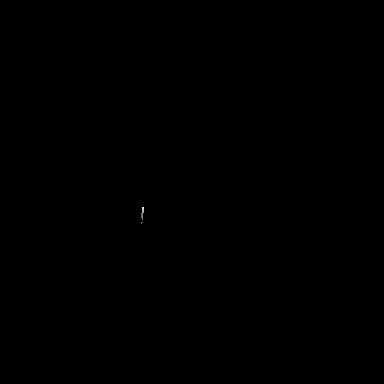

[Series 9: T1 · sagittal · 5.0mm · 0.62mm/px · 1 of 23 slices shown (1 of 2)]
[im 1/23]
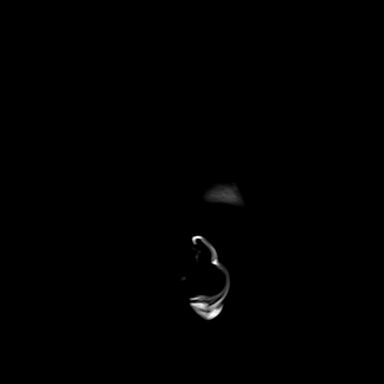

[Series 10: T2 · axial · 5.0mm · 0.53mm/px · z∈[-93,+62]mm · 2 of 27 slices shown]
[im 1/27]
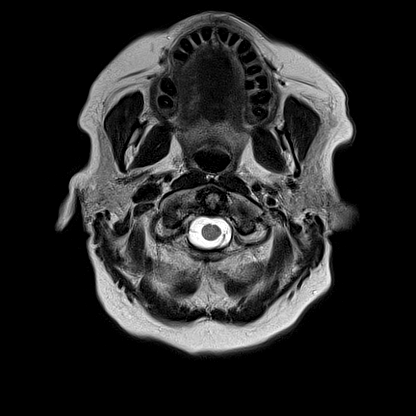
[im 27/27]
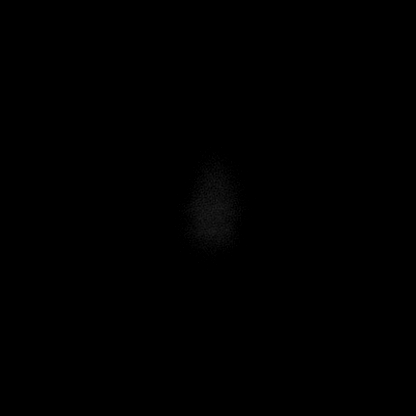

[Series 12: pha_images · axial · 3.0mm · 0.90mm/px · z∈[-104,+72]mm · 4 of 60 slices shown]
[im 1/60]
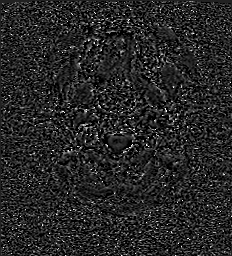
[im 20/60]
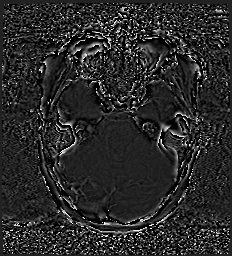
[im 40/60]
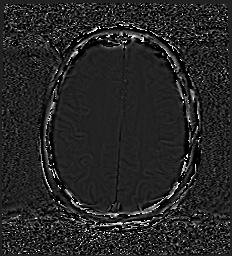
[im 60/60]
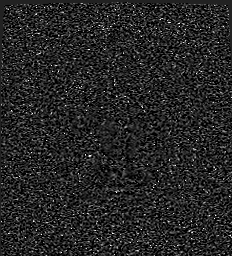

[Series 13: swi_images · axial · 3.0mm · 0.90mm/px · z∈[-104,+72]mm · 4 of 60 slices shown]
[im 1/60]
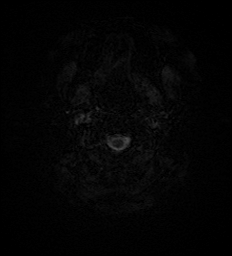
[im 20/60]
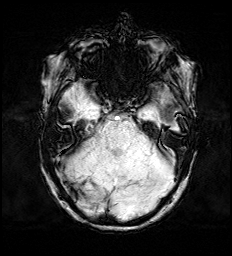
[im 40/60]
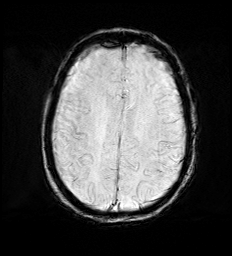
[im 60/60]
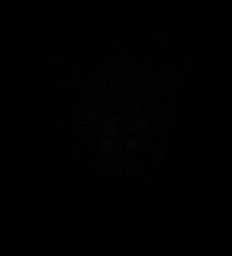

[Series 15: FLAIR · axial · 3.0mm · 0.53mm/px · z∈[-96,+65]mm · 3 of 55 slices shown]
[im 1/55]
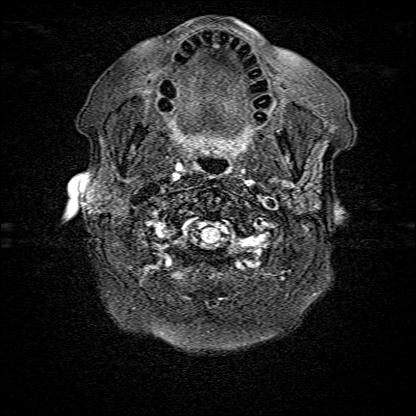
[im 28/55]
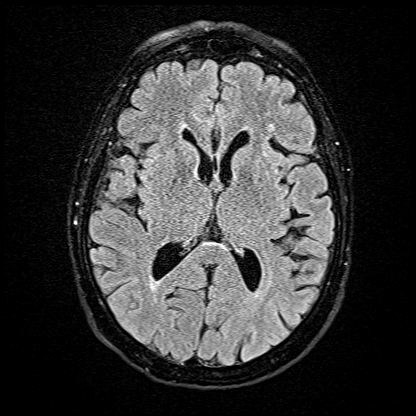
[im 55/55]
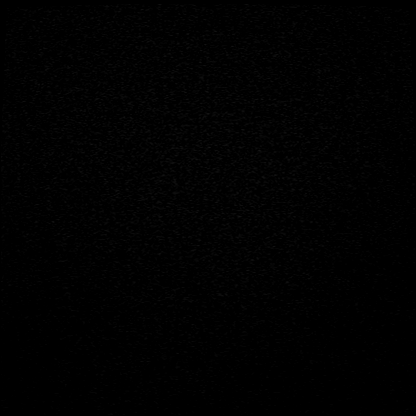

[Series 16: T1 · axial · 1.0mm · 0.98mm/px · z∈[-96,+62]mm · 10 of 160 slices shown (2 of 2)]
[im 1/160]
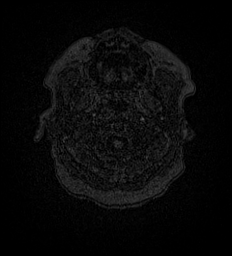
[im 18/160]
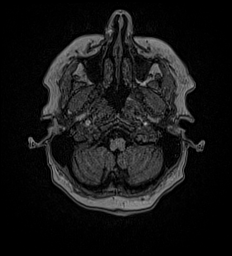
[im 36/160]
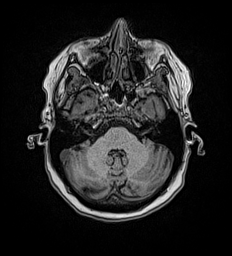
[im 54/160]
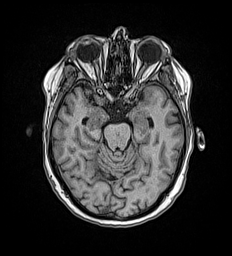
[im 71/160]
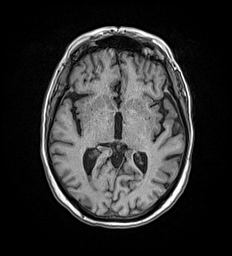
[im 89/160]
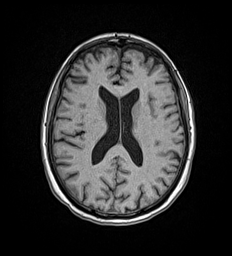
[im 107/160]
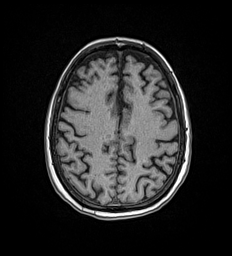
[im 124/160]
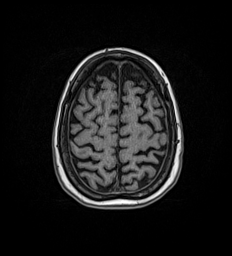
[im 142/160]
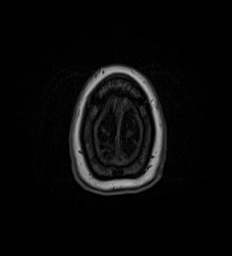
[im 160/160]
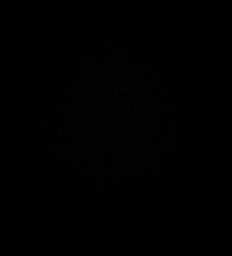

[Series 21: T2 post-contrast · coronal · 5.0mm · 0.57mm/px · 2 of 29 slices shown]
[im 1/29]
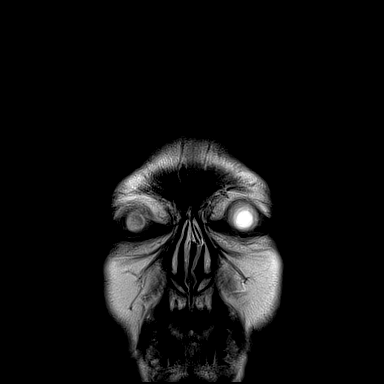
[im 29/29]
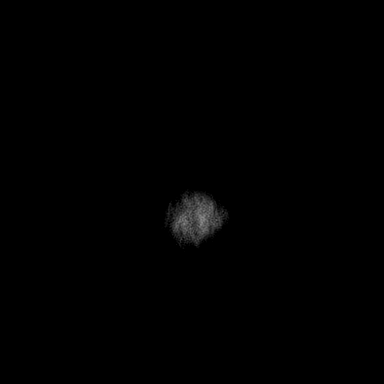

[Series 22: T1 post-contrast · axial · 1.0mm · 0.98mm/px · z∈[+1,+158]mm · 10 of 158 slices shown (1 of 3)]
[im 1/158]
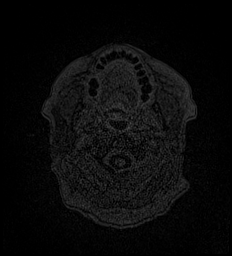
[im 18/158]
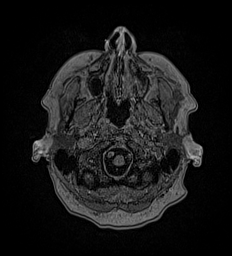
[im 35/158]
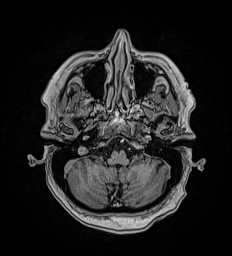
[im 53/158]
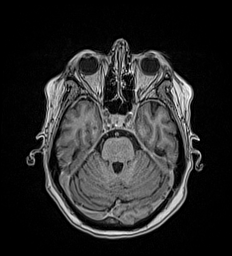
[im 70/158]
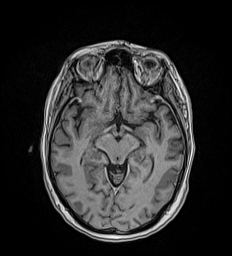
[im 88/158]
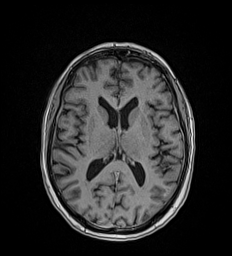
[im 105/158]
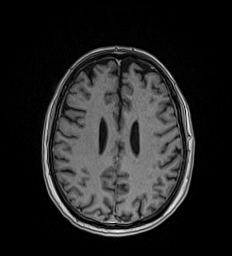
[im 123/158]
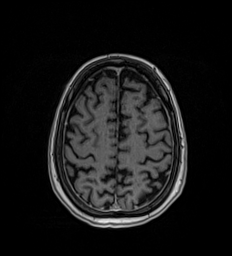
[im 140/158]
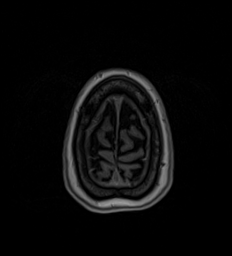
[im 158/158]
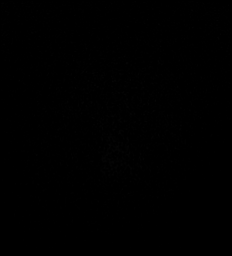

[Series 23: T1 post-contrast · coronal · 5.0mm · 0.57mm/px · 2 of 29 slices shown (2 of 3)]
[im 1/29]
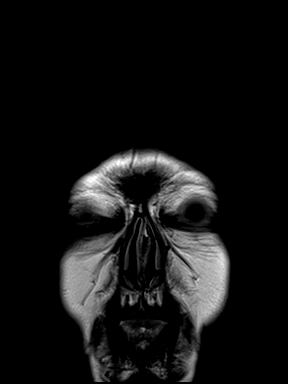
[im 29/29]
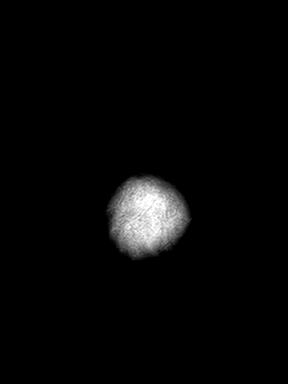

[Series 24: T1 post-contrast · sagittal · 5.0mm · 0.62mm/px · 1 of 23 slices shown (3 of 3)]
[im 1/23]
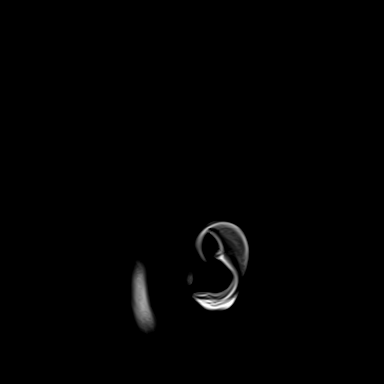

[48 of 48 positions shown; findings below may reference images not displayed]

FINDINGS: Brain: There is no evidence of an acute infarct, intracranial
hemorrhage, mass, midline shift, or extra-axial fluid collection.
The ventricles and sulci are normal. A few small T2 hyperintensities
in the cerebral white matter and pons are nonspecific but compatible
with minimal chronic small vessel ischemic disease. No enhancing
brain lesions are identified, however sensitivity for detection of
small lesions is reduced by the incomplete contrast injection.

Vascular: Major intracranial vascular flow voids are preserved.

Skull and upper cervical spine: Unremarkable bone marrow signal.

Sinuses/Orbits: Left cataract extraction. Mucous retention cyst in
the left maxillary sinus. Trace left mastoid effusion. Suspected 1
cm epidermal inclusion cyst in the right cheek region.

Other: None.
IMPRESSION: 1. No evidence of intracranial metastases within above detailed
study limitations.
2. Minimal chronic small vessel ischemic disease.

## 2021-07-26 MED ORDER — GADOBUTROL 1 MMOL/ML IV SOLN
7.5000 mL | Freq: Once | INTRAVENOUS | Status: AC | PRN
  Administered 2021-07-26: 3 mL via INTRAVENOUS

## 2021-07-28 ENCOUNTER — Encounter: Payer: Self-pay | Admitting: Oncology

## 2021-07-28 ENCOUNTER — Inpatient Hospital Stay: Payer: Medicare PPO | Admitting: Oncology

## 2021-07-28 ENCOUNTER — Inpatient Hospital Stay: Payer: Medicare PPO

## 2021-07-28 ENCOUNTER — Other Ambulatory Visit: Payer: Self-pay

## 2021-07-28 ENCOUNTER — Ambulatory Visit
Admission: RE | Admit: 2021-07-28 | Discharge: 2021-07-28 | Disposition: A | Discharge status: Home / Self Care | Payer: Medicare PPO | Source: Ambulatory Visit | Attending: Radiation Oncology | Admitting: Radiation Oncology

## 2021-07-28 VITALS — BP 144/64 | HR 68 | Temp 97.4°F | Wt 176.0 lb

## 2021-07-28 DIAGNOSIS — C7951 Secondary malignant neoplasm of bone: Secondary | ICD-10-CM | POA: Diagnosis not present

## 2021-07-28 DIAGNOSIS — C801 Malignant (primary) neoplasm, unspecified: Secondary | ICD-10-CM

## 2021-07-28 DIAGNOSIS — J90 Pleural effusion, not elsewhere classified: Secondary | ICD-10-CM | POA: Diagnosis not present

## 2021-07-28 DIAGNOSIS — J9601 Acute respiratory failure with hypoxia: Secondary | ICD-10-CM

## 2021-07-28 DIAGNOSIS — C50919 Malignant neoplasm of unspecified site of unspecified female breast: Secondary | ICD-10-CM

## 2021-07-28 DIAGNOSIS — Z510 Encounter for antineoplastic radiation therapy: Secondary | ICD-10-CM | POA: Diagnosis not present

## 2021-07-28 DIAGNOSIS — G893 Neoplasm related pain (acute) (chronic): Secondary | ICD-10-CM

## 2021-07-28 LAB — COMPREHENSIVE METABOLIC PANEL
ALT: 25 U/L (ref 0–44)
AST: 28 U/L (ref 15–41)
Albumin: 3.9 g/dL (ref 3.5–5.0)
Alkaline Phosphatase: 99 U/L (ref 38–126)
Anion gap: 10 (ref 5–15)
BUN: 21 mg/dL (ref 8–23)
CO2: 33 mmol/L — ABNORMAL HIGH (ref 22–32)
Calcium: 8.8 mg/dL — ABNORMAL LOW (ref 8.9–10.3)
Chloride: 89 mmol/L — ABNORMAL LOW (ref 98–111)
Creatinine, Ser: 0.74 mg/dL (ref 0.44–1.00)
GFR, Estimated: >60 mL/min (ref >60)
Glucose, Bld: 137 mg/dL — ABNORMAL HIGH (ref 70–99)
Potassium: 4.4 mmol/L (ref 3.5–5.1)
Sodium: 132 mmol/L — ABNORMAL LOW (ref 135–145)
Total Bilirubin: 1.1 mg/dL (ref 0.3–1.2)
Total Protein: 6.9 g/dL (ref 6.5–8.1)

## 2021-07-28 LAB — CBC WITH DIFFERENTIAL/PLATELET
Abs Immature Granulocytes: 0.21 10*3/uL — ABNORMAL HIGH (ref 0.00–0.07)
Basophils Absolute: 0 10*3/uL (ref 0.0–0.1)
Basophils Relative: 0 %
Eosinophils Absolute: 0.2 10*3/uL (ref 0.0–0.5)
Eosinophils Relative: 2 %
HCT: 41.5 % (ref 36.0–46.0)
Hemoglobin: 13.7 g/dL (ref 12.0–15.0)
Immature Granulocytes: 2 %
Lymphocytes Relative: 11 %
Lymphs Abs: 1.2 10*3/uL (ref 0.7–4.0)
MCH: 28.9 pg (ref 26.0–34.0)
MCHC: 33 g/dL (ref 30.0–36.0)
MCV: 87.6 fL (ref 80.0–100.0)
Monocytes Absolute: 0.7 10*3/uL (ref 0.1–1.0)
Monocytes Relative: 6 %
Neutro Abs: 8.3 10*3/uL — ABNORMAL HIGH (ref 1.7–7.7)
Neutrophils Relative %: 79 %
Platelets: 410 10*3/uL — ABNORMAL HIGH (ref 150–400)
RBC: 4.74 MIL/uL (ref 3.87–5.11)
RDW: 13.9 % (ref 11.5–15.5)
WBC: 10.6 10*3/uL — ABNORMAL HIGH (ref 4.0–10.5)
nRBC: 0 % (ref 0.0–0.2)

## 2021-07-28 MED ORDER — SODIUM CHLORIDE 0.9 % IV SOLN
Freq: Once | INTRAVENOUS | Status: AC
  Filled 2021-07-28: qty 250

## 2021-07-28 MED ORDER — SODIUM CHLORIDE 0.9 % IV SOLN
10.0000 mg | Freq: Once | INTRAVENOUS | Status: AC
  Administered 2021-07-28: 10 mg via INTRAVENOUS
  Filled 2021-07-28: qty 10

## 2021-07-28 MED ORDER — OXYCODONE HCL 5 MG PO TABS: 5.0000 mg | ORAL_TABLET | Freq: Four times a day (QID) | ORAL | 0 refills | Status: DC | PRN

## 2021-07-28 MED ORDER — DEXAMETHASONE 1 MG PO TABS: 2.0000 mg | ORAL_TABLET | Freq: Every day | ORAL | 0 refills | Status: DC

## 2021-07-28 MED ORDER — DIPHENHYDRAMINE HCL 50 MG/ML IJ SOLN
50.0000 mg | Freq: Once | INTRAMUSCULAR | Status: AC
Start: 2021-07-28 — End: 2021-07-28
  Administered 2021-07-28: 50 mg via INTRAVENOUS
  Filled 2021-07-28: qty 1

## 2021-07-28 MED ORDER — LIDOCAINE-PRILOCAINE 2.5-2.5 % EX CREA: 1.0000 "application " | TOPICAL_CREAM | CUTANEOUS | 5 refills | Status: DC | PRN

## 2021-07-28 MED ORDER — FAMOTIDINE 20 MG IN NS 100 ML IVPB
20.0000 mg | Freq: Once | INTRAVENOUS | Status: AC
  Administered 2021-07-28: 20 mg via INTRAVENOUS
  Filled 2021-07-28: qty 20

## 2021-07-28 MED ORDER — SODIUM CHLORIDE 0.9 % IV SOLN
80.0000 mg/m2 | Freq: Once | INTRAVENOUS | Status: AC
  Administered 2021-07-28: 156 mg via INTRAVENOUS
  Filled 2021-07-28: qty 26

## 2021-07-28 NOTE — Progress Notes (Signed)
Patient here for follow up. Patient has some concerns with her incontinence.

## 2021-07-28 NOTE — Patient Instructions (Signed)
[facility]  Discharge Instructions: Thank you for choosing [facility] to provide your oncology and hematology care.  If you have a lab appointment with the [facility], please go directly to the [facility] and check in at the registration area.  Wear comfortable clothing and clothing appropriate for easy access to any Portacath or PICC line.   We strive to give you quality time with your provider. You may need to reschedule your appointment if you arrive late (15 or more minutes).  Arriving late affects you and other patients whose appointments are after yours.  Also, if you miss three or more appointments without notifying the office, you may be dismissed from the clinic at the provider's discretion.      For prescription refill requests, have your pharmacy contact our office and allow 72 hours for refills to be completed.    Today you received the following chemotherapy and/or immunotherapy agents taxol       To help prevent nausea and vomiting after your treatment, we encourage you to take your nausea medication as directed.  BELOW ARE SYMPTOMS THAT SHOULD BE REPORTED IMMEDIATELY: *FEVER GREATER THAN 100.4 F (38 C) OR HIGHER *CHILLS OR SWEATING *NAUSEA AND VOMITING THAT IS NOT CONTROLLED WITH YOUR NAUSEA MEDICATION *UNUSUAL SHORTNESS OF BREATH *UNUSUAL BRUISING OR BLEEDING *URINARY PROBLEMS (pain or burning when urinating, or frequent urination) *BOWEL PROBLEMS (unusual diarrhea, constipation, pain near the anus) TENDERNESS IN MOUTH AND THROAT WITH OR WITHOUT PRESENCE OF ULCERS (sore throat, sores in mouth, or a toothache) UNUSUAL RASH, SWELLING OR PAIN  UNUSUAL VAGINAL DISCHARGE OR ITCHING   Items with * indicate a potential emergency and should be followed up as soon as possible or go to the Emergency Department if any problems should occur.  Please show the CHEMOTHERAPY ALERT CARD or IMMUNOTHERAPY ALERT CARD at check-in to  the Emergency Department and triage nurse.  Should you have questions after your visit or need to cancel or reschedule your appointment, please contact [facility]  [phone] and follow the prompts.  Office hours are 8:00 a.m. to 4:30 p.m. Monday - Friday. Please note that voicemails left after 4:00 p.m. may not be returned until the following business day.  We are closed weekends and major holidays. You have access to a nurse at all times for urgent questions. Please call the main number to the clinic [phone] and follow the prompts.  For any non-urgent questions, you may also contact your provider using MyChart. We now offer e-Visits for anyone 64 and older to request care online for non-urgent symptoms. For details visit [email or web address].   Also download the MyChart app! Go to the app store, search "MyChart", open the app, select [facility], and log in with your MyChart username and password.  Due to Covid, a mask is required upon entering the hospital/clinic. If you do not have a mask, one will be given to you upon arrival. For doctor visits, patients may have 1 support person aged 26 or older with them. For treatment visits, patients cannot have anyone with them due to current Covid guidelines and our immunocompromised population.   Paclitaxel injection What is this medication? PACLITAXEL (PAK li TAX el) is a chemotherapy drug. It targets fast dividing cells, like cancer cells, and causes these cells to die. This medicine is used to treat ovarian cancer, breast cancer, lung cancer, Kaposi's sarcoma, and other cancers. This medicine may be used for other purposes;  ask your health care provider or pharmacist if you have questions. COMMON BRAND NAME(S): Onxol, Taxol What should I tell my care team before I take this medication? They need to know if you have any of these conditions: history of irregular heartbeat liver disease low blood counts,  like low white cell, platelet, or red cell counts lung or breathing disease, like asthma tingling of the fingers or toes, or other nerve disorder an unusual or allergic reaction to paclitaxel, alcohol, polyoxyethylated castor oil, other chemotherapy, other medicines, foods, dyes, or preservatives pregnant or trying to get pregnant breast-feeding How should I use this medication? This drug is given as an infusion into a vein. It is administered in a hospital or clinic by a specially trained health care professional. Talk to your pediatrician regarding the use of this medicine in children. Special care may be needed. Overdosage: If you think you have taken too much of this medicine contact a poison control center or emergency room at once. NOTE: This medicine is only for you. Do not share this medicine with others. What if I miss a dose? It is important not to miss your dose. Call your doctor or health care professional if you are unable to keep an appointment. What may interact with this medication? Do not take this medicine with any of the following medications: live virus vaccines This medicine may also interact with the following medications: antiviral medicines for hepatitis, HIV or AIDS certain antibiotics like erythromycin and clarithromycin certain medicines for fungal infections like ketoconazole and itraconazole certain medicines for seizures like carbamazepine, phenobarbital, phenytoin gemfibrozil nefazodone rifampin St. John's wort This list may not describe all possible interactions. Give your health care provider a list of all the medicines, herbs, non-prescription drugs, or dietary supplements you use. Also tell them if you smoke, drink alcohol, or use illegal drugs. Some items may interact with your medicine. What should I watch for while using this medication? Your condition will be monitored carefully while you are receiving this medicine. You will need important blood work  done while you are taking this medicine. This medicine can cause serious allergic reactions. To reduce your risk you will need to take other medicine(s) before treatment with this medicine. If you experience allergic reactions like skin rash, itching or hives, swelling of the face, lips, or tongue, tell your doctor or health care professional right away. In some cases, you may be given additional medicines to help with side effects. Follow all directions for their use. This drug may make you feel generally unwell. This is not uncommon, as chemotherapy can affect healthy cells as well as cancer cells. Report any side effects. Continue your course of treatment even though you feel ill unless your doctor tells you to stop. Call your doctor or health care professional for advice if you get a fever, chills or sore throat, or other symptoms of a cold or flu. Do not treat yourself. This drug decreases your body's ability to fight infections. Try to avoid being around people who are sick. This medicine may increase your risk to bruise or bleed. Call your doctor or health care professional if you notice any unusual bleeding. Be careful brushing and flossing your teeth or using a toothpick because you may get an infection or bleed more easily. If you have any dental work done, tell your dentist you are receiving this medicine. Avoid taking products that contain aspirin, acetaminophen, ibuprofen, naproxen, or ketoprofen unless instructed by your doctor. These medicines may hide  a fever. Do not become pregnant while taking this medicine. Women should inform their doctor if they wish to become pregnant or think they might be pregnant. There is a potential for serious side effects to an unborn child. Talk to your health care professional or pharmacist for more information. Do not breast-feed an infant while taking this medicine. Men are advised not to father a child while receiving this medicine. This product may  contain alcohol. Ask your pharmacist or healthcare provider if this medicine contains alcohol. Be sure to tell all healthcare providers you are taking this medicine. Certain medicines, like metronidazole and disulfiram, can cause an unpleasant reaction when taken with alcohol. The reaction includes flushing, headache, nausea, vomiting, sweating, and increased thirst. The reaction can last from 30 minutes to several hours. What side effects may I notice from receiving this medication? Side effects that you should report to your doctor or health care professional as soon as possible: allergic reactions like skin rash, itching or hives, swelling of the face, lips, or tongue breathing problems changes in vision fast, irregular heartbeat high or low blood pressure mouth sores pain, tingling, numbness in the hands or feet signs of decreased platelets or bleeding - bruising, pinpoint red spots on the skin, black, tarry stools, blood in the urine signs of decreased red blood cells - unusually weak or tired, feeling faint or lightheaded, falls signs of infection - fever or chills, cough, sore throat, pain or difficulty passing urine signs and symptoms of liver injury like dark yellow or brown urine; general ill feeling or flu-like symptoms; light-colored stools; loss of appetite; nausea; right upper belly pain; unusually weak or tired; yellowing of the eyes or skin swelling of the ankles, feet, hands unusually slow heartbeat Side effects that usually do not require medical attention (report to your doctor or health care professional if they continue or are bothersome): diarrhea hair loss loss of appetite muscle or joint pain nausea, vomiting pain, redness, or irritation at site where injected tiredness This list may not describe all possible side effects. Call your doctor for medical advice about side effects. You may report side effects to FDA at 1-800-FDA-1088. Where should I keep my  medication? This drug is given in a hospital or clinic and will not be stored at home. NOTE: This sheet is a summary. It may not cover all possible information. If you have questions about this medicine, talk to your doctor, pharmacist, or health care provider.  2022 [employer] Standard (2019-08-16 13:37:23)

## 2021-07-28 NOTE — Progress Notes (Signed)
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  female with [place] listed below was seen in consultation at the request of  Gayland Curry, MD  for evaluation of tumor in lumbar vertebrae  # patient reports feeling tightness and pain in Feb 2022. She was started on conservative management which did not help her symptoms. She also started to experience numbness.  She takes gabapentin which partially relieve the discomfort. She denies bowel or bladder incontinence, no lower extremity weakness.   06/25/2021 MRI lumbar spine IMPRESSION:  1. 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 Additional metastatic involvement T12, L1 through L3.  No primary tumor site identified. Top differential considerations are Metastatic Disease Unknown primary, less likely Lymphoma or Multiple Myeloma.   2. Superimposed lumbar spine degeneration, including degenerative moderate to severe left L3 and L4 nerve level impingement from disc herniation.    Patient was seen by neurosurgeon Dr.Yarbrough today. He has ordered MRI cervical and thoracic spine and sacrum biopsy for further work up.   Patient has a history of breast cancer, diagnosed in 2010 Left-sided T2 (2.4cm) N0, ER/PR positive, HER2 negative IDC of the breast, s/p lumpectomy by Dr.Meyer at [facility]. Oncotype score  of 11 who completed radiation and she finished 10 years of [employer] from 05/2009 and stopped in 2020.  she has intentionally lost some weight, no night sweating, fever.   # 07/02/2021, CT chest abdomen pelvis showed innumerable small pulmonary and pleural nodules consistent with diffuse metastasis.  Associated with probable malignant pleural effusion.  Mediastinal and hilar lymphadenopathy consistent with metastatic disease.  Hepatic and bilateral adrenal gland metastasis.  Diffuse extensive destructive metastatic bone disease involving pelvis.   07/11/2021 - 07/14/2021  Patient was admitted to hospital due to shortness of breath. 07/12/2021, CT chest PE protocol showed no PE.  Further increase of March pleural effusion and admit to moderate interstitial pulmonary edema.  Bulky mediastinal and hilar lymphadenopathy.  Unchanged.  Incompletely visualized left adrenal nodule. 07/12/2021 patient underwent right thoracentesis.  Cytology was positive for metastatic carcinoma, compatible with breast origin.  ER/PR/HER2 status is pending.  07/16/2021, patient underwent iliac bone biopsy.  Positive for metastatic carcinoma.  INTERVAL HISTORY Amber Blevins is a 70 y.o. female who has above history reviewed by me today presents for follow up visit for management of metastatic breast cancer . Problems and complaints are listed below:  07/25/2021, patient underwent thoracentesis of right side, removed 550 cc straw-colored pleural fluid.  She tolerated procedure well. Today she reports shortness of breath with exertion has slightly improved.  She uses home oxygen 2 L Lower back/sacral area pain has improved.  She is currently on palliative radiation. She is currently on dexamethasone 4 mg daily. She reports urine incontinence, especially when standing up.  No focal weakness of lower extremities. She denies any diarrhea, reports very small amount of soft bowel movement after urination.  Review of Systems   Constitutional:  Negative for appetite change, chills,  fatigue and fever.  HENT:   Negative for hearing loss and voice change.   Eyes:  Negative for eye problems.  Respiratory:  Negative for chest tightness and cough.   Cardiovascular:  Negative for chest pain.  Gastrointestinal:  Negative for abdominal distention, abdominal pain and blood in stool.  Endocrine: Negative for hot flashes.  Genitourinary:  Negative for difficulty urinating and frequency.   Musculoskeletal:  Positive for back pain. Negative for arthralgias.       Lower back/sacrum pain and numbness  Skin:  Negative for itching and rash.  Neurological:  Negative for extremity weakness.  Hematological:  Negative for adenopathy.  Psychiatric/Behavioral:  Negative for confusion.    MEDICAL HISTORY:  Past Medical History:  Diagnosis Date   Breast cancer ([place])    High cholesterol    Hypertension    Personal history of radiation therapy     SURGICAL HISTORY: Past Surgical History:  Procedure Laterality Date   BREAST LUMPECTOMY Left    2010   thorocentesis Right     SOCIAL HISTORY: Social History   Socioeconomic History   Marital status: Married    Spouse name: Not on file   Number of children: Not on file   Years of education: Not on file   Highest education level: Not on file  Occupational History   Not on file  Tobacco Use   Smoking status: Former    Packs/day: 1.00    Years: 25.00    Pack years: 25.00    Types: Cigarettes    Quit date: 5    Years since quitting: 32.8   Smokeless tobacco: Never  Vaping Use   Vaping Use: Never used  Substance and Sexual Activity   Alcohol use: Yes   Drug use: Never   Sexual activity: Not on file  Other Topics Concern   Not on file  Social History Narrative   Not on file   Social Determinants of Health   Financial Resource Strain: Not on file  Food Insecurity: Not on file  Transportation Needs: Not on file  Physical Activity: Not on file  Stress: Not on  file  Social Connections: Not on file  Intimate Partner Violence: Not on file    FAMILY HISTORY: Family History  Problem Relation Age of Onset   Cancer Mother        gynecological   Lung cancer Mother    Diabetes Father    Heart disease Father    Parkinson's disease Father    Brain cancer Brother    Bladder Cancer Brother    Pulmonary disease Brother    Rheumatic fever Brother    Breast cancer Neg Hx     ALLERGIES:  is allergic to green tea (camellia sinensis), melaleuca viridiflora, and lisinopril.  MEDICATIONS:  Current Outpatient Medications  Medication Sig Dispense Refill   acetaminophen (TYLENOL) 650 MG CR tablet Take 650 mg by mouth every 8 (eight) hours as needed for pain.     albuterol (VENTOLIN HFA) 108 (90 Base) MCG/ACT inhaler Inhale 2 puffs into the lungs every 6 (six) hours as needed.     aspirin 81 MG EC tablet Take by mouth.     atorvastatin (LIPITOR) 40 MG tablet Take 40 mg by mouth at bedtime.     Boswellia-Glucosamine-Vit D (OSTEO BI-FLEX-GLUCOS/5-LOXIN) TABS Take 1 capsule by mouth in the morning and at bedtime.     Calcium Carb-Cholecalciferol 600-400 MG-UNIT TABS Take 1 tablet by mouth daily.     [START ON  08/01/2021] dexamethasone (DECADRON) 1 MG tablet Take 2 tablets (2 mg total) by mouth daily. 14 tablet 0   dexamethasone (DECADRON) 4 MG tablet Take 1 tablet (4 mg total) by mouth daily. 20 tablet 0   Fluticasone-Umeclidin-Vilant (TRELEGY ELLIPTA) 100-62.5-25 MCG/INH AEPB Inhale into the lungs. Inhale 1 inhalation into the lungs once daily     lidocaine-prilocaine (EMLA) cream Apply 1 application topically as needed. 30 g 5   LORazepam (ATIVAN) 0.5 MG tablet Take 1 tablet (0.5 mg total) by mouth at bedtime as needed for anxiety. 30 tablet 0   magnesium chloride (SLOW-MAG) 64 MG TBEC SR tablet Take 1 tablet by mouth at bedtime.     metoprolol-hydrochlorothiazide (LOPRESSOR HCT) 50-25 MG tablet Take 1 tablet by mouth daily. Take 1 tablet by mouth once daily      ondansetron (ZOFRAN) 8 MG tablet Take 1 tablet (8 mg total) by mouth 2 (two) times daily as needed (Nausea or vomiting). 30 tablet 1   prochlorperazine (COMPAZINE) 10 MG tablet Take 1 tablet (10 mg total) by mouth every 6 (six) hours as needed (Nausea or vomiting). 30 tablet 1   Turmeric (QC TUMERIC COMPLEX PO) Take 1 capsule by mouth at bedtime.     Docusate Sodium (DSS) 100 MG CAPS Take 1 capsule by mouth every other day.     gabapentin (NEURONTIN) 300 MG capsule Take 2 capsules by mouth in the morning, at noon, and at bedtime.     ibuprofen (ADVIL) 200 MG tablet Take by mouth. Take 400 mg by mouth every six (6) hours as needed for pain. (Patient not taking: No sig reported)     oxyCODONE (OXY IR/ROXICODONE) 5 MG immediate release tablet Take 1 tablet (5 mg total) by mouth every 6 (six) hours as needed for severe pain. 30 tablet 0   No current facility-administered medications for this visit.     PHYSICAL EXAMINATION: ECOG PERFORMANCE STATUS: 1 - Symptomatic but completely ambulatory Vitals:   07/28/21 0838  BP: (!) 144/64  Pulse: 68  Temp: (!) 97.4 F (36.3 C)  SpO2: 99%   Filed Weights   07/28/21 0838  Weight: 176 lb (79.8 kg)    Physical Exam Constitutional:      General: She is not in acute distress. HENT:     Head: Normocephalic and atraumatic.  Eyes:     General: No scleral icterus. Cardiovascular:     Rate and Rhythm: Normal rate and regular rhythm.     Heart sounds: Normal heart sounds.  Pulmonary:     Effort: Pulmonary effort is normal. No respiratory distress.     Breath sounds: No wheezing.     Comments: Decreased breath sound bilaterally Abdominal:     General: Bowel sounds are normal. There is no distension.     Palpations: Abdomen is soft.  Musculoskeletal:        General: No deformity. Normal range of motion.     Cervical back: Normal range of motion and neck supple.  Skin:    General: Skin is warm and dry.     Findings: No erythema or rash.   Neurological:     Mental Status: She is alert and oriented to person, place, and time. Mental status is at baseline.     Cranial Nerves: No cranial nerve deficit.     Coordination: Coordination normal.  Psychiatric:        Mood and Affect: Mood normal.    LABORATORY DATA:  I have reviewed the data as listed  Lab Results  Component Value Date   WBC 10.6 (H) 07/28/2021   HGB 13.7 07/28/2021   HCT 41.5 07/28/2021   MCV 87.6 07/28/2021   PLT 410 (H) 07/28/2021   Recent Labs    07/02/21 1604 07/11/21 2216 07/21/21 0805 07/28/21 0752  NA 132* 137 130* 132*  K 3.6 3.7 3.8 4.4  CL 95* 99 93* 89*  CO2 26 27 27  33*  GLUCOSE 100* 110* 182* 137*  BUN 18 18 28* 21  CREATININE 0.75 0.72 0.70 0.74  CALCIUM 9.4 9.8 9.2 8.8*  GFRNONAA >60 >60 >60 >60  PROT 8.3*  --  7.5 6.9  ALBUMIN 4.3  --  3.8 3.9  AST 28  --  32 28  ALT 18  --  20 25  ALKPHOS 107  --  96 99  BILITOT 1.2  --  0.8 1.1    Iron/TIBC/Ferritin/ %Sat No results found for: IRON, TIBC, FERRITIN, IRONPCTSAT    RADIOGRAPHIC STUDIES: I have personally reviewed the radiological images as listed and agreed with the findings in the report. DG Chest 1 View  Result Date: 07/12/2021 CLINICAL DATA:  Status post right thoracentesis EXAM: CHEST  1 VIEW COMPARISON:  07/11/2021 FINDINGS: Improvement in the right effusion following thoracentesis. Trace effusions remain bilaterally. No pneumothorax. Stable heart size. Similar diffuse interstitial opacities suggesting edema and persistent bibasilar atelectasis. Trachea midline. Aorta atherosclerotic. Degenerative changes of the spine. IMPRESSION: Negative for pneumothorax following right thoracentesis. Stable exam. Electronically Signed   By: Jerilynn Mages.  Shick M.D.   On: 07/12/2021 13:21   DG Chest 2 View  Result Date: 07/11/2021 CLINICAL DATA:  Worsening shortness of breath. EXAM: CHEST - 2 VIEW COMPARISON:  None. FINDINGS: Mild to moderate severity diffusely increased interstitial lung  markings are seen. Mild to moderate severity areas of bibasilar atelectasis and/or early infiltrate are also present. There are small bilateral pleural effusions. No pneumothorax is identified. The heart size and mediastinal contours are within normal limits. There is moderate severity calcification of the aortic arch. The visualized skeletal structures are unremarkable. IMPRESSION: 1. Mild to moderate severity interstitial edema with bibasilar atelectasis and/or early infiltrate. 2. Small bilateral pleural effusions. Electronically Signed   By: Virgina Norfolk M.D.   On: 07/11/2021 23:03   CT Angio Chest PE W/Cm &/Or Wo Cm  Result Date: 07/12/2021 CLINICAL DATA:  Shortness of breath EXAM: CT ANGIOGRAPHY CHEST WITH CONTRAST TECHNIQUE: Multidetector CT imaging of the chest was performed using the standard protocol during bolus administration of intravenous contrast. Multiplanar CT image reconstructions and MIPs were obtained to evaluate the vascular anatomy. CONTRAST:  9m OMNIPAQUE IOHEXOL 350 MG/ML SOLN COMPARISON:  None. FINDINGS: Cardiovascular: Contrast injection is sufficient to demonstrate satisfactory opacification of the pulmonary arteries to the segmental level. There is no pulmonary embolus or evidence of right heart strain. The size of the main pulmonary artery is normal. Heart size is normal, with no pericardial effusion. The course and caliber of the aorta are normal. There is atherosclerotic calcification. No acute aortic syndrome. Mediastinum/Nodes: There is mediastinal and hilar lymphadenopathy that is unchanged. Right hilar nodes cause marked narrowing of right middle lobe arterial branches. No axillary adenopathy is visible. Soft tissue focus in the outer left breast is unchanged. Lungs/Pleura: Large pleural effusions. Airways are patent. There is mild-to-moderate interstitial pulmonary edema. Upper Abdomen: Contrast bolus timing is not optimized for evaluation of the abdominal organs.  Incompletely visualized left adrenal nodule measures at least 11 mm. Musculoskeletal: No chest wall abnormality. No  bony spinal canal stenosis. Review of the MIP images confirms the above findings. IMPRESSION: 1. No pulmonary embolus or acute aortic syndrome. 2. Large pleural effusions and mild-to-moderate interstitial pulmonary edema. 3. Bulky mediastinal and hilar lymphadenopathy, unchanged. 4. Incompletely visualized left adrenal nodule. Electronically Signed   By: Ulyses Jarred M.D.   On: 07/12/2021 00:28   MR CERVICAL SPINE W WO CONTRAST  Result Date: 07/25/2021 CLINICAL DATA:  Initial evaluation for metastatic disease. History of breast cancer. EXAM: MRI CERVICAL AND THORACIC SPINE WITHOUT AND WITH CONTRAST TECHNIQUE: Multiplanar and multiecho pulse sequences of the cervical spine, to include the craniocervical junction and cervicothoracic junction, and the thoracic spine, were obtained without and with intravenous contrast. CONTRAST:  58m GADAVIST GADOBUTROL 1 MMOL/ML IV SOLN COMPARISON:  None available. FINDINGS: MRI CERVICAL SPINE FINDINGS Alignment: Straightening of the normal cervical lordosis. No listhesis. Vertebrae: T1 hypointense, stir hyperintense enhancing lesion largely replacing the C6 vertebral body, consistent with an osseous metastatic implant. An additional separate implant seen involving the C6 spinous process (series 35, image 7). No other metastatic disease within the cervical spine itself, although note is made of additional metastatic implants involving the upper thoracic spine at the levels of T2 and T3, described on corresponding thoracic spine portion of this report. No pathologic fracture or significant extra osseous extension of tumor. Bone marrow signal intensity otherwise normal. Vertebral body height maintained. No other abnormal marrow edema or enhancement. Cord: Normal signal and morphology. No epidural or intramedullary tumor. No abnormal enhancement. Posterior Fossa,  vertebral arteries, paraspinal tissues: Visualized brain and posterior fossa within normal limits. Craniocervical junction normal. Paraspinous soft tissues within normal limits. Normal flow voids seen within the vertebral arteries bilaterally. Layering bilateral pleural effusions partially visualized. Disc levels: C2-C3: Mild uncovertebral spurring without significant disc bulge. Mild facet hypertrophy on the right. No stenosis. C3-C4: Disc bulge with endplate and uncovertebral spurring. Bilateral facet hypertrophy. Mild to moderate spinal stenosis with moderate left worse than right C4 foraminal narrowing. C4-C5: Disc bulge with endplate and uncovertebral spurring, worse on the right. Right-sided facet hypertrophy. Mild canal with moderate right C5 foraminal stenosis. C5-C6: Mild disc bulge with uncovertebral spurring. Mild to moderate bilateral C6 foraminal narrowing, worse on the right. No spinal stenosis. C6-C7: Disc bulge with bilateral uncovertebral hypertrophy. Flattening of the ventral thecal sac with borderline mild spinal stenosis. Moderate to severe bilateral C7 foraminal narrowing, worse on the left. C7-T1:  Negative. MRI THORACIC SPINE FINDINGS Alignment: Straightening of the normal midthoracic kyphosis. No listhesis. Vertebrae: Multiple scattered osseous metastases seen throughout the thoracic spine. There is involvement of most levels, with most prominent lesions seen involving the T3, T5, T6, T7, and T10 vertebral bodies. Associated pathologic fracture at the inferior endplate of T7 with mild height loss but no bony retropulsion. Additional mild concavity about the T3 and T10 vertebral bodies also suspicious for associated compression fractures. Mild extra osseous extension about a lesion involving the spinous process of T2 (series 34, image 8). Probable early epidural extension into the right neural foramina of T2-3 (series 27, image 6) and T9-10 (series 27, image 4). No other significant extra  osseous or epidural extension at this time. Cord: Normal signal and morphology. Probable early epidural extension into the right neural foramina at T2-3 and T9-10 as above. No other significant epidural involvement at this time. No intramedullary tumor or abnormal enhancement. Paraspinal and other soft tissues: 2.1 cm soft tissue implant noted within the left posterior paraspinous soft tissues of the upper back  at the level of T1-2 (series 34, image 15). Additional probable small soft tissue implant within the left posterior soft tissues at the level of T12 (series 34, image 14). Large layering bilateral pleural effusions noted. Disc levels: Underlying mild for age spondylosis. No significant disc bulge or focal disc herniation. No significant stenosis. IMPRESSION: MRI CERVICAL SPINE IMPRESSION: 1. Osseous metastatic disease involving the C6 vertebral body and C6 spinous process. No other evidence for metastatic disease within the cervical spine. No extra osseous extension or epidural involvement. 2. Underlying cervical spondylosis with resultant mild to moderate spinal stenosis at C3-4 and C4-5. Moderate bilateral C4 and right C5 foraminal narrowing, with moderate to severe bilateral C7 foraminal stenosis. MRI THORACIC SPINE IMPRESSION: 1. Diffuse osseous metastatic disease throughout the thoracic spine as detailed above. Associated pathologic compression fracture of T7, with additional probable mild compression fractures of T3 and T10. 2. Mild/early epidural extension into the right neural foramina at T2-3 and T9-10. No other significant epidural tumor at this time. 3. Extra osseous extension tumor about a lesion involving the T2 spinous process. 4. Few soft tissue implants involving the left posterior paraspinous soft tissues at the levels of T1-2 and T12 as above. 5. Large layering bilateral pleural effusions. Electronically Signed   By: Jeannine Boga M.D.   On: 07/25/2021 05:23   MR THORACIC SPINE W WO  CONTRAST  Result Date: 07/25/2021 CLINICAL DATA:  Initial evaluation for metastatic disease. History of breast cancer. EXAM: MRI CERVICAL AND THORACIC SPINE WITHOUT AND WITH CONTRAST TECHNIQUE: Multiplanar and multiecho pulse sequences of the cervical spine, to include the craniocervical junction and cervicothoracic junction, and the thoracic spine, were obtained without and with intravenous contrast. CONTRAST:  3m GADAVIST GADOBUTROL 1 MMOL/ML IV SOLN COMPARISON:  None available. FINDINGS: MRI CERVICAL SPINE FINDINGS Alignment: Straightening of the normal cervical lordosis. No listhesis. Vertebrae: T1 hypointense, stir hyperintense enhancing lesion largely replacing the C6 vertebral body, consistent with an osseous metastatic implant. An additional separate implant seen involving the C6 spinous process (series 35, image 7). No other metastatic disease within the cervical spine itself, although note is made of additional metastatic implants involving the upper thoracic spine at the levels of T2 and T3, described on corresponding thoracic spine portion of this report. No pathologic fracture or significant extra osseous extension of tumor. Bone marrow signal intensity otherwise normal. Vertebral body height maintained. No other abnormal marrow edema or enhancement. Cord: Normal signal and morphology. No epidural or intramedullary tumor. No abnormal enhancement. Posterior Fossa, vertebral arteries, paraspinal tissues: Visualized brain and posterior fossa within normal limits. Craniocervical junction normal. Paraspinous soft tissues within normal limits. Normal flow voids seen within the vertebral arteries bilaterally. Layering bilateral pleural effusions partially visualized. Disc levels: C2-C3: Mild uncovertebral spurring without significant disc bulge. Mild facet hypertrophy on the right. No stenosis. C3-C4: Disc bulge with endplate and uncovertebral spurring. Bilateral facet hypertrophy. Mild to moderate spinal  stenosis with moderate left worse than right C4 foraminal narrowing. C4-C5: Disc bulge with endplate and uncovertebral spurring, worse on the right. Right-sided facet hypertrophy. Mild canal with moderate right C5 foraminal stenosis. C5-C6: Mild disc bulge with uncovertebral spurring. Mild to moderate bilateral C6 foraminal narrowing, worse on the right. No spinal stenosis. C6-C7: Disc bulge with bilateral uncovertebral hypertrophy. Flattening of the ventral thecal sac with borderline mild spinal stenosis. Moderate to severe bilateral C7 foraminal narrowing, worse on the left. C7-T1:  Negative. MRI THORACIC SPINE FINDINGS Alignment: Straightening of the normal midthoracic kyphosis. No listhesis.  Vertebrae: Multiple scattered osseous metastases seen throughout the thoracic spine. There is involvement of most levels, with most prominent lesions seen involving the T3, T5, T6, T7, and T10 vertebral bodies. Associated pathologic fracture at the inferior endplate of T7 with mild height loss but no bony retropulsion. Additional mild concavity about the T3 and T10 vertebral bodies also suspicious for associated compression fractures. Mild extra osseous extension about a lesion involving the spinous process of T2 (series 34, image 8). Probable early epidural extension into the right neural foramina of T2-3 (series 27, image 6) and T9-10 (series 27, image 4). No other significant extra osseous or epidural extension at this time. Cord: Normal signal and morphology. Probable early epidural extension into the right neural foramina at T2-3 and T9-10 as above. No other significant epidural involvement at this time. No intramedullary tumor or abnormal enhancement. Paraspinal and other soft tissues: 2.1 cm soft tissue implant noted within the left posterior paraspinous soft tissues of the upper back at the level of T1-2 (series 34, image 15). Additional probable small soft tissue implant within the left posterior soft tissues at the  level of T12 (series 34, image 14). Large layering bilateral pleural effusions noted. Disc levels: Underlying mild for age spondylosis. No significant disc bulge or focal disc herniation. No significant stenosis. IMPRESSION: MRI CERVICAL SPINE IMPRESSION: 1. Osseous metastatic disease involving the C6 vertebral body and C6 spinous process. No other evidence for metastatic disease within the cervical spine. No extra osseous extension or epidural involvement. 2. Underlying cervical spondylosis with resultant mild to moderate spinal stenosis at C3-4 and C4-5. Moderate bilateral C4 and right C5 foraminal narrowing, with moderate to severe bilateral C7 foraminal stenosis. MRI THORACIC SPINE IMPRESSION: 1. Diffuse osseous metastatic disease throughout the thoracic spine as detailed above. Associated pathologic compression fracture of T7, with additional probable mild compression fractures of T3 and T10. 2. Mild/early epidural extension into the right neural foramina at T2-3 and T9-10. No other significant epidural tumor at this time. 3. Extra osseous extension tumor about a lesion involving the T2 spinous process. 4. Few soft tissue implants involving the left posterior paraspinous soft tissues at the levels of T1-2 and T12 as above. 5. Large layering bilateral pleural effusions. Electronically Signed   By: Jeannine Boga M.D.   On: 07/25/2021 05:23   [place] CHEST (PLEURAL EFFUSION)  Result Date: 07/14/2021 CLINICAL DATA:  70 year old female with history of pleural effusion. EXAM: CHEST ULTRASOUND COMPARISON:  None. FINDINGS: Trace bilateral pleural effusions. IMPRESSION: Trace bilateral pleural effusions. No safe window for thoracentesis. Ruthann Cancer, MD [facility] Radiology Electronically Signed   By: Ruthann Cancer M.D.   On: 07/14/2021 15:08   CT CHEST ABDOMEN PELVIS W CONTRAST  Result Date: 07/02/2021 CLINICAL DATA:  Remote history of breast cancer (2010).  New lumbosacral bone lesions found on MRI lumbar spine 06/25/2021 EXAM: CT CHEST, ABDOMEN, AND PELVIS WITH CONTRAST TECHNIQUE: Multidetector CT imaging of the chest, abdomen and pelvis was performed following the standard protocol during bolus administration of intravenous contrast. CONTRAST:  10mL OMNIPAQUE IOHEXOL 350 MG/ML SOLN COMPARISON:  Lumbar spine MRI from 06/25/2021 FINDINGS: CT CHEST FINDINGS Cardiovascular: The heart is normal in size. No pericardial effusion. The aorta is normal in caliber. No dissection. Moderate atherosclerotic calcifications. Scattered coronary artery calcifications. Mediastinum/Nodes: Mediastinal and hilar lymphadenopathy consistent with metastatic disease. Pretracheal lymph node on image 21/2 measures 20 mm Prevascular node on image 21/2 measures 12 mm. Right hilar node on image 25/2  measures 19 mm. Subcarinal node on image 28/2 measures 14 mm The esophagus is grossly normal. Lungs/Pleura: Innumerable small pulmonary and pleural nodules involving both lungs consistent with diffuse metastatic disease. There are associated bilateral pleural effusions likely malignant. Musculoskeletal: No breast masses, supraclavicular or axillary adenopathy. The thyroid gland is unremarkable. Mixed lytic and sclerotic metastatic bone disease most notably affecting T3 and T7. Mild pathologic compression fracture of T7 canal compromise. There is also a small lytic lesion involving T12. Suspect a mildly sclerotic lesion involving the manubrium of the sternum. Subtle mixed lytic and sclerotic lesion involving the upper sternum with some erosion of the posterior cortex. CT ABDOMEN PELVIS FINDINGS Hepatobiliary: There are 5 vague low-attenuation hepatic lesions consistent with metastatic disease. 17 mm lesion in segment 8, 11.5 mm lesion in segment 3, 10 mm lesion in segment 4B, 15 mm lesion lower in segment 4B and 15 mm lesion in segment 6. The gallbladder is unremarkable.  No common bile duct dilatation.  Pancreas: No mass, inflammation or ductal dilatation. Spleen: Normal size.  No focal lesions. Adrenals/Urinary Tract: Bilateral adrenal gland lesions likely metastatic disease. The largest lesion measures 13 mm in the left gland. The kidneys are unremarkable. No renal lesions or hydronephrosis. The bladder is unremarkable. Stomach/Bowel: The stomach, duodenum, small bowel and colon are grossly normal. Vascular/Lymphatic: Moderate atherosclerotic calcifications involving the aorta and branch vessels but no aneurysm or dissection. No mesenteric or retroperitoneal mass or adenopathy. Reproductive: The uterus and ovaries are unremarkable. Other: No pelvic mass or adenopathy. No free pelvic fluid collections. No inguinal mass or adenopathy. No abdominal wall hernia or subcutaneous lesions. Musculoskeletal: Severe destructive metastatic bone disease involving the lower lumbar vertebral bodies, the sacrum and iliac bones. The sacrum is largely destroyed. No definite involvement the hips. IMPRESSION: 1. Innumerable small pulmonary and pleural nodules consistent with diffuse metastatic disease. Associated probable malignant pleural effusions. 2. Mediastinal and hilar lymphadenopathy consistent with metastatic disease. 3. Hepatic and bilateral adrenal gland metastasis. 4. Severe, diffuse and extensive, destructive metastatic bone disease involving the pelvis. Other metastatic bone lesions as detailed above. Aortic Atherosclerosis (ICD10-I70.0). Electronically Signed   By: Marijo Sanes M.D.   On: 07/02/2021 18:38   DG Chest Port 1 View  Result Date: 07/25/2021 CLINICAL DATA:  Status post right-sided thoracentesis, remote history of breast cancer EXAM: PORTABLE CHEST 1 VIEW COMPARISON:  07/12/2021 FINDINGS: Single frontal view of the chest demonstrates a stable cardiac silhouette. There are trace bilateral pleural effusions. No evidence of pneumothorax. Bronchovascular prominence with patchy bibasilar consolidation  unchanged. No acute bony abnormalities. IMPRESSION: 1. Trace bilateral pleural effusions. 2. No evidence of pneumothorax. 3. Stable bronchovascular prominence and bibasilar consolidation. Electronically Signed   By: Randa Ngo M.D.   On: 07/25/2021 16:17   ECHOCARDIOGRAM COMPLETE  Result Date: 07/13/2021    ECHOCARDIOGRAM REPORT   Patient Name:   Amber Blevins Date of Exam: 07/13/2021 Medical Rec #:  [id number]          Height:       65.0 in Accession #:    [id number]         Weight:       182.1 lb Date of Birth:  October 02, 1950          BSA:          1.901 m Patient Age:    89 years           BP:           151/79 mmHg Patient  Gender: F                  HR:           100 bpm. Exam Location:  [facility] Procedure: 2D Echo, Cardiac Doppler and Color Doppler Indications:     SOB (shortness of breath) [[id number]]  History:         Patient has no prior history of Echocardiogram examinations.                  Risk Factors:Hypertension.  Sonographer:     Alyse Low Roar Referring Phys:  [place] Diagnosing Phys: Ida Rogue MD IMPRESSIONS  1. Left ventricular ejection fraction, by estimation, is 60 to 65%. The left ventricle has normal function. The left ventricle has no regional wall motion abnormalities. There is mild left ventricular hypertrophy. Left ventricular diastolic parameters are consistent with Grade I diastolic dysfunction (impaired relaxation).  2. Right ventricular systolic function is normal. The right ventricular size is normal. Tricuspid regurgitation signal is inadequate for assessing PA pressure.  3. The mitral valve is normal in structure. No evidence of mitral valve regurgitation. No evidence of mitral stenosis. FINDINGS  Left Ventricle: Left ventricular ejection fraction, by estimation, is 60 to 65%. The left ventricle has normal function. The left ventricle has no regional wall motion abnormalities. The left ventricular internal cavity size was normal in size. There is  mild left  ventricular hypertrophy. Left ventricular diastolic parameters are consistent with Grade I diastolic dysfunction (impaired relaxation). Right Ventricle: The right ventricular size is normal. No increase in right ventricular wall thickness. Right ventricular systolic function is normal. Tricuspid regurgitation signal is inadequate for assessing PA pressure. Left Atrium: Left atrial size was normal in size. Right Atrium: Right atrial size was normal in size. Pericardium: There is no evidence of pericardial effusion. Mitral Valve: The mitral valve is normal in structure. No evidence of mitral valve regurgitation. No evidence of mitral valve stenosis. Tricuspid Valve: The tricuspid valve is normal in structure. Tricuspid valve regurgitation is not demonstrated. No evidence of tricuspid stenosis. Aortic Valve: The aortic valve was not well visualized. Aortic valve regurgitation is not visualized. No aortic stenosis is present. Aortic valve peak gradient measures 9.4 mmHg. Pulmonic Valve: The pulmonic valve was normal in structure. Pulmonic valve regurgitation is not visualized. No evidence of pulmonic stenosis. Aorta: The aortic root is normal in size and structure. Venous: The inferior vena cava is normal in size with greater than 50% respiratory variability, suggesting right atrial pressure of 3 mmHg. IAS/Shunts: No atrial level shunt detected by color flow Doppler.  LEFT VENTRICLE PLAX 2D LVIDd:         3.91 cm   Diastology LVIDs:         2.70 cm   LV e' medial:    4.24 cm/s LV PW:         1.16 cm   LV E/e' medial:  17.9 LV IVS:        1.31 cm   LV e' lateral:   5.87 cm/s LVOT diam:     1.80 cm   LV E/e' lateral: 12.9 LVOT Area:     2.54 cm  RIGHT VENTRICLE RV Basal diam:  2.51 cm RV Mid diam:    2.14 cm RV S prime:     12.10 cm/s TAPSE (M-mode): 2.2 cm LEFT ATRIUM             Index  RIGHT ATRIUM          Index LA diam:        3.40 cm 1.79 cm/m   RA Area:     9.27 cm LA Vol (A2C):   45.1 ml 23.73 ml/m  RA  Volume:   15.90 ml 8.36 ml/m LA Vol (A4C):   33.1 ml 17.41 ml/m LA Biplane Vol: 40.3 ml 21.20 ml/m  AORTIC VALVE                 PULMONIC VALVE AV Area (Vmax): 1.73 cm     PV Vmax:          1.02 m/s AV Vmax:        153.00 cm/s  PV Peak grad:     4.2 mmHg AV Peak Grad:   9.4 mmHg     PR End Diast Vel: 7.95 msec LVOT Vmax:      104.00 cm/s  RVOT Peak grad:   2 mmHg  AORTA Ao Root diam: 2.60 cm MITRAL VALVE MV Area (PHT): 4.41 cm     SHUNTS MV Decel Time: 172 msec     Systemic Diam: 1.80 cm MV E velocity: 75.80 cm/s MV A velocity: 105.00 cm/s MV E/A ratio:  0.72 MV A Prime:    12.7 cm/s Ida Rogue MD Electronically signed by Ida Rogue MD Signature Date/Time: 07/13/2021/1:49:22 PM    Final    CT BONE TROCAR/NEEDLE BIOPSY DEEP  Result Date: 07/16/2021 INDICATION: History of breast cancer, now with extensive osseous metastatic disease worrisome for metastatic breast cancer. Please perform CT-guided biopsy for tissue diagnostic purposes. EXAM: CT-GUIDED BONE LESION BIOPSY MEDICATIONS: None ANESTHESIA/SEDATION: Fentanyl 100 mcg IV; Versed 2 mg IV Sedation Time: 16 Minutes; The patient was continuously monitored during the procedure by the interventional radiology nurse under my direct supervision. COMPLICATIONS: None immediate. PROCEDURE: Informed consent was obtained from the patient following an explanation of the procedure, risks, benefits and alternatives. The patient understands, agrees and consents for the procedure. All questions were addressed. A time out was performed prior to the initiation of the procedure. The patient was positioned prone and non-contrast localization CT was performed of the pelvis redemonstrated the infiltrative lesion involving the posterior aspect of the right ilium. The operative site was prepped and draped in the usual sterile fashion. Under sterile conditions and local anesthesia, a 22 gauge spinal needle was utilized for procedural planning. Next, an 11 gauge coaxial  bone biopsy needle was advanced into the infiltrative lesion involving the posterior aspect the right ilium. Needle position was confirmed with CT imaging (image 8, series 3). Next, the inner 13 gauge bone biopsy device was utilized to acquire sample (representative image 10, series 3). Next, the outer 11 gauge biopsy device was utilized to acquire 4 additional samples all under intermittent CT guidance. The needle was removed and superficial hemostasis was obtained with manual compression. A dressing was applied. The patient tolerated the procedure well without immediate post procedural complication. IMPRESSION: Successful CT guided biopsy of lytic lesion involving the posterior aspect of the right ilium. Electronically Signed   By: Sandi Mariscal M.D.   On: 07/16/2021 12:50   US THORACENTESIS ASP PLEURAL SPACE W/IMG GUIDE  Result Date: 07/25/2021 INDICATION: SOB EXAM: ULTRASOUND GUIDED RIGHT THORACENTESIS MEDICATIONS: None. COMPLICATIONS: None immediate. PROCEDURE: An ultrasound guided thoracentesis was thoroughly discussed with the patient and questions answered. The benefits, risks, alternatives and complications were also discussed. The patient understands and wishes to proceed with the procedure. Written consent  was obtained. Ultrasound was performed to localize and mark an adequate pocket of fluid in the right chest. The area was then prepped and draped in the normal sterile fashion. 1% Lidocaine was used for local anesthesia. Under ultrasound guidance a 19 gauge Yueh catheter was introduced. Thoracentesis was performed. The catheter was removed and a dressing applied. FINDINGS: A total of approximately 550 mL of clear straw-colored fluid was removed. IMPRESSION: Successful ultrasound guided right thoracentesis yielding 550 mL of clear straw-colored pleural fluid. Electronically Signed   By: Albin Felling M.D.   On: 07/25/2021 16:57   US THORACENTESIS ASP PLEURAL SPACE W/IMG GUIDE  Result Date:  07/12/2021 INDICATION: Patient with history of metastatic breast cancer, dyspnea, bilateral pleural effusions; request received for diagnostic and therapeutic right thoracentesis. EXAM: ULTRASOUND GUIDED DIAGNOSTIC AND THERAPEUTIC RIGHT THORACENTESIS MEDICATIONS: 10 mL 1% lidocaine COMPLICATIONS: None immediate. PROCEDURE: An ultrasound guided thoracentesis was thoroughly discussed with the patient and questions answered. The benefits, risks, alternatives and complications were also discussed. The patient understands and wishes to proceed with the procedure. Written consent was obtained. Ultrasound was performed to localize and mark an adequate pocket of fluid in the right chest. The area was then prepped and draped in the normal sterile fashion. 1% Lidocaine was used for local anesthesia. Under ultrasound guidance a 6 Fr Safe-T-Centesis catheter was introduced. Thoracentesis was performed. The catheter was removed and a dressing applied. FINDINGS: A total of approximately 750 cc of yellow fluid was removed. Samples were sent to the laboratory as requested by the clinical team. IMPRESSION: Successful ultrasound guided diagnostic and therapeutic right thoracentesis yielding 750 cc of pleural fluid. Read by: Rowe Robert, PA-C Electronically Signed   By: Jerilynn Mages.  Shick M.D.   On: 07/12/2021 12:40      ASSESSMENT & PLAN:  1. Metastatic breast cancer ([place])   2. Bilateral pleural effusion   3. Acute respiratory failure with hypoxia ([facility])   4. Metastasis to bone ([place])   5. Neoplasm related pain    # Metastatic Breast cancer with extensive thoracic and bone involvement, in visceral crisis, ER+, PR+ HER2 neg MRI brain is pending Patient tolerated first cycle of Taxol treatments well. Proceed with cycle 2 weekly Taxol. Dr. Izora Ribas has ordered MRI cervical and thoracic spine Patient has osseous metastasis involving C6, cervical spondylosis. Diffuse osseous metastatic disease throughout thoracic spine.   Pathological compression fracture of T7, possible compression fracture of T3 and T10.  Mild/early epidural extension into right neural foramina at T2-3 and T9-10 Extraosseous extension tumor about a lesion involving T2 spinous process.  Diffuse soft tissue implant involving left posterior paraspinous soft tissue at the level of T1-T2, and T12.  Large layering bilateral pleural effusion.  Sent results to radiation oncology Dr. Baruch Gouty.  I will defer to [place] to see if she needs palliative treatments to rest of her spine.      # Bone metastasis palliative RT. She is starting today.  Continue Dexamethasone 4mg  daily and finish rest of her supply [5 days] Taper down to 2mg  daily afterwards.   # Neoplasm related pain,  Continue oxycodone 5mg  Q6h PRN for pain. Symptom is well controlled.   #Anxiety and insomnia, recommend low-dose Ativan 0.5 mg QHS PRN #Stress incontinence. Monitor symptoms.     All questions were answered. The patient knows to call the clinic with any problems questions or concerns.  cc Gayland Curry, MD    Follow up in 1 week.   [name], MD, PhD Hematology Oncology [facility]  [place] [facility]  07/28/2021
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
## 2021-07-29 ENCOUNTER — Other Ambulatory Visit
Admission: RE | Admit: 2021-07-29 | Discharge: 2021-07-29 | Disposition: A | Discharge status: Home / Self Care | Payer: Medicare PPO | Source: Ambulatory Visit | Attending: General Surgery | Admitting: General Surgery

## 2021-07-29 ENCOUNTER — Ambulatory Visit
Admission: RE | Admit: 2021-07-29 | Discharge: 2021-07-29 | Disposition: A | Discharge status: Home / Self Care | Payer: Medicare PPO | Source: Ambulatory Visit | Attending: Radiation Oncology | Admitting: Radiation Oncology

## 2021-07-29 DIAGNOSIS — C773 Secondary and unspecified malignant neoplasm of axilla and upper limb lymph nodes: Secondary | ICD-10-CM | POA: Insufficient documentation

## 2021-07-29 DIAGNOSIS — I10 Essential (primary) hypertension: Secondary | ICD-10-CM | POA: Insufficient documentation

## 2021-07-29 DIAGNOSIS — J449 Chronic obstructive pulmonary disease, unspecified: Secondary | ICD-10-CM | POA: Diagnosis not present

## 2021-07-29 DIAGNOSIS — C787 Secondary malignant neoplasm of liver and intrahepatic bile duct: Secondary | ICD-10-CM | POA: Diagnosis not present

## 2021-07-29 DIAGNOSIS — G620 Drug-induced polyneuropathy: Secondary | ICD-10-CM | POA: Diagnosis not present

## 2021-07-29 DIAGNOSIS — R0602 Shortness of breath: Secondary | ICD-10-CM | POA: Diagnosis not present

## 2021-07-29 DIAGNOSIS — C50919 Malignant neoplasm of unspecified site of unspecified female breast: Secondary | ICD-10-CM | POA: Insufficient documentation

## 2021-07-29 DIAGNOSIS — R197 Diarrhea, unspecified: Secondary | ICD-10-CM | POA: Diagnosis not present

## 2021-07-29 DIAGNOSIS — E7800 Pure hypercholesterolemia, unspecified: Secondary | ICD-10-CM | POA: Insufficient documentation

## 2021-07-29 DIAGNOSIS — Z170 Estrogen receptor positive status [ER+]: Secondary | ICD-10-CM | POA: Diagnosis not present

## 2021-07-29 DIAGNOSIS — Z923 Personal history of irradiation: Secondary | ICD-10-CM | POA: Insufficient documentation

## 2021-07-29 DIAGNOSIS — C7971 Secondary malignant neoplasm of right adrenal gland: Secondary | ICD-10-CM | POA: Diagnosis not present

## 2021-07-29 DIAGNOSIS — Z853 Personal history of malignant neoplasm of breast: Secondary | ICD-10-CM | POA: Insufficient documentation

## 2021-07-29 DIAGNOSIS — C782 Secondary malignant neoplasm of pleura: Secondary | ICD-10-CM | POA: Diagnosis not present

## 2021-07-29 DIAGNOSIS — Z5111 Encounter for antineoplastic chemotherapy: Secondary | ICD-10-CM | POA: Diagnosis present

## 2021-07-29 DIAGNOSIS — C7951 Secondary malignant neoplasm of bone: Secondary | ICD-10-CM | POA: Insufficient documentation

## 2021-07-29 DIAGNOSIS — M5126 Other intervertebral disc displacement, lumbar region: Secondary | ICD-10-CM | POA: Insufficient documentation

## 2021-07-29 DIAGNOSIS — G893 Neoplasm related pain (acute) (chronic): Secondary | ICD-10-CM | POA: Diagnosis not present

## 2021-07-29 DIAGNOSIS — J910 Malignant pleural effusion: Secondary | ICD-10-CM | POA: Diagnosis not present

## 2021-07-29 DIAGNOSIS — E441 Mild protein-calorie malnutrition: Secondary | ICD-10-CM | POA: Diagnosis not present

## 2021-07-29 DIAGNOSIS — E8809 Other disorders of plasma-protein metabolism, not elsewhere classified: Secondary | ICD-10-CM | POA: Diagnosis not present

## 2021-07-29 DIAGNOSIS — C7800 Secondary malignant neoplasm of unspecified lung: Secondary | ICD-10-CM | POA: Insufficient documentation

## 2021-07-29 DIAGNOSIS — Z79899 Other long term (current) drug therapy: Secondary | ICD-10-CM | POA: Insufficient documentation

## 2021-07-29 DIAGNOSIS — C7970 Secondary malignant neoplasm of unspecified adrenal gland: Secondary | ICD-10-CM | POA: Insufficient documentation

## 2021-07-29 DIAGNOSIS — Z510 Encounter for antineoplastic radiation therapy: Secondary | ICD-10-CM | POA: Insufficient documentation

## 2021-07-29 DIAGNOSIS — R600 Localized edema: Secondary | ICD-10-CM | POA: Diagnosis not present

## 2021-07-29 DIAGNOSIS — Z87891 Personal history of nicotine dependence: Secondary | ICD-10-CM | POA: Insufficient documentation

## 2021-07-29 DIAGNOSIS — C7972 Secondary malignant neoplasm of left adrenal gland: Secondary | ICD-10-CM | POA: Diagnosis not present

## 2021-07-29 DIAGNOSIS — Z79891 Long term (current) use of opiate analgesic: Secondary | ICD-10-CM | POA: Diagnosis not present

## 2021-07-29 DIAGNOSIS — C771 Secondary and unspecified malignant neoplasm of intrathoracic lymph nodes: Secondary | ICD-10-CM | POA: Diagnosis not present

## 2021-07-29 HISTORY — DX: Family history of other specified conditions: Z84.89

## 2021-07-29 HISTORY — DX: Prediabetes: R73.03

## 2021-07-29 HISTORY — DX: Chronic obstructive pulmonary disease, unspecified: J44.9

## 2021-07-29 NOTE — Patient Instructions (Signed)
Your procedure is scheduled on: Friday August 01, 2021. Report to Day Surgery inside [facility] 2nd floor.  To find out your arrival time please call [phone] between 1PM - 3PM on Thursday July 31, 2021.  Remember: Instructions that are not followed completely may result in serious medical risk,  up to and including death, or upon the discretion of your surgeon and anesthesiologist your  surgery may need to be rescheduled.     _X__ 1. Do not eat food after midnight the night before your procedure.                 No chewing gum or hard candies.   __X__2.  On the morning of surgery brush your teeth with toothpaste and water, you                may rinse your mouth with mouthwash if you wish.  Do not swallow any toothpaste or mouthwash.     _X__ 3.  No Alcohol for 24 hours before or after surgery.   _X__ 4.  Do Not Smoke or use e-cigarettes For 24 Hours Prior to Your Surgery.                 Do not use any chewable tobacco products for at least 6 hours prior to                 Surgery.  _X__  5.  Do not use any recreational drugs (marijuana, cocaine, heroin, ecstasy, MDMA or other)                For at least one week prior to your surgery.  Combination of these drugs with anesthesia                May have life threatening results.  __X__6.  Notify your doctor if there is any change in your medical condition      (cold, fever, infections).     Do not wear jewelry, make-up, hairpins, clips or nail polish. Do not wear lotions, powders, or perfumes.  Do not shave 48 hours prior to surgery. Do not bring valuables to the hospital.    [facility] is not responsible for any belongings or valuables.  Contacts, dentures or bridgework may not be worn into surgery. Leave your suitcase in the car. After surgery it may be brought to your room. For patients admitted to the hospital, discharge time is determined by your treatment team.   Patients discharged  the day of surgery will not be allowed to drive home.   Make arrangements for someone to be with you for the first 24 hours of your Same Day Discharge.   __X__ Take these medicines the morning of surgery with A SIP OF WATER:    1. gabapentin (NEURONTIN) 300 MG  2. oxyCODONE (OXY IR/ROXICODONE) 5 MG   3. dexamethasone (DECADRON) 1 MG  4.  5.  6.  ____ Fleet Enema (as directed)   __X__ Use CHG Soap (or wipes) as directed    ____ Use Benzoyl Peroxide Gel as instructed  __X__ Use inhalers on the day of surgery  albuterol (VENTOLIN HFA) 108 (90 Base) MCG/ACT inhaler  Fluticasone-Umeclidin-Vilant (TRELEGY ELLIPTA) 100-62.5-25 MCG/INH AEPB  ____ Stop metformin 2 days prior to surgery    ____ Take 1/2 of usual insulin dose the night before surgery. No insulin the morning          of surgery.   __X__ you already stopped  aspirin 81 mg as instructed by your doctor.   __X__ One Week prior to surgery- Stop Anti-inflammatories such as Ibuprofen, Aleve, Advil, Motrin, meloxicam (MOBIC), diclofenac, etodolac, ketorolac, Toradol, Daypro, piroxicam, Goody's or BC powders. OK TO USE TYLENOL IF NEEDED   __X__ Stop now supplements until after surgery. Boswellia-Glucosamine-Vit D (OSTEO BI-FLEX-GLUCOS/5-LOXIN), IMODIUM,  Turmeric  ____ Bring C-Pap to the hospital.    If you have any questions regarding your pre-procedure instructions,  Please call Pre-admit Testing at [phone]

## 2021-07-30 ENCOUNTER — Inpatient Hospital Stay: Payer: Medicare PPO | Attending: Hospice and Palliative Medicine | Admitting: Hospice and Palliative Medicine

## 2021-07-30 ENCOUNTER — Ambulatory Visit: Payer: Self-pay | Admitting: General Surgery

## 2021-07-30 ENCOUNTER — Ambulatory Visit
Admission: RE | Admit: 2021-07-30 | Discharge: 2021-07-30 | Disposition: A | Discharge status: Home / Self Care | Payer: Medicare PPO | Source: Ambulatory Visit | Attending: Radiation Oncology | Admitting: Radiation Oncology

## 2021-07-30 DIAGNOSIS — E8809 Other disorders of plasma-protein metabolism, not elsewhere classified: Secondary | ICD-10-CM | POA: Insufficient documentation

## 2021-07-30 DIAGNOSIS — E7800 Pure hypercholesterolemia, unspecified: Secondary | ICD-10-CM | POA: Insufficient documentation

## 2021-07-30 DIAGNOSIS — C7951 Secondary malignant neoplasm of bone: Secondary | ICD-10-CM | POA: Insufficient documentation

## 2021-07-30 DIAGNOSIS — R600 Localized edema: Secondary | ICD-10-CM | POA: Insufficient documentation

## 2021-07-30 DIAGNOSIS — M5126 Other intervertebral disc displacement, lumbar region: Secondary | ICD-10-CM | POA: Insufficient documentation

## 2021-07-30 DIAGNOSIS — M5116 Intervertebral disc disorders with radiculopathy, lumbar region: Secondary | ICD-10-CM | POA: Insufficient documentation

## 2021-07-30 DIAGNOSIS — Z79891 Long term (current) use of opiate analgesic: Secondary | ICD-10-CM | POA: Insufficient documentation

## 2021-07-30 DIAGNOSIS — Z510 Encounter for antineoplastic radiation therapy: Secondary | ICD-10-CM | POA: Insufficient documentation

## 2021-07-30 DIAGNOSIS — C771 Secondary and unspecified malignant neoplasm of intrathoracic lymph nodes: Secondary | ICD-10-CM | POA: Insufficient documentation

## 2021-07-30 DIAGNOSIS — C787 Secondary malignant neoplasm of liver and intrahepatic bile duct: Secondary | ICD-10-CM | POA: Insufficient documentation

## 2021-07-30 DIAGNOSIS — C7800 Secondary malignant neoplasm of unspecified lung: Secondary | ICD-10-CM | POA: Insufficient documentation

## 2021-07-30 DIAGNOSIS — C782 Secondary malignant neoplasm of pleura: Secondary | ICD-10-CM | POA: Insufficient documentation

## 2021-07-30 DIAGNOSIS — C7972 Secondary malignant neoplasm of left adrenal gland: Secondary | ICD-10-CM | POA: Insufficient documentation

## 2021-07-30 DIAGNOSIS — G4700 Insomnia, unspecified: Secondary | ICD-10-CM | POA: Insufficient documentation

## 2021-07-30 DIAGNOSIS — J910 Malignant pleural effusion: Secondary | ICD-10-CM | POA: Insufficient documentation

## 2021-07-30 DIAGNOSIS — C7970 Secondary malignant neoplasm of unspecified adrenal gland: Secondary | ICD-10-CM | POA: Insufficient documentation

## 2021-07-30 DIAGNOSIS — Z853 Personal history of malignant neoplasm of breast: Secondary | ICD-10-CM | POA: Insufficient documentation

## 2021-07-30 DIAGNOSIS — F419 Anxiety disorder, unspecified: Secondary | ICD-10-CM | POA: Insufficient documentation

## 2021-07-30 DIAGNOSIS — Z170 Estrogen receptor positive status [ER+]: Secondary | ICD-10-CM | POA: Insufficient documentation

## 2021-07-30 DIAGNOSIS — R197 Diarrhea, unspecified: Secondary | ICD-10-CM | POA: Insufficient documentation

## 2021-07-30 DIAGNOSIS — E441 Mild protein-calorie malnutrition: Secondary | ICD-10-CM | POA: Insufficient documentation

## 2021-07-30 DIAGNOSIS — Z5111 Encounter for antineoplastic chemotherapy: Secondary | ICD-10-CM | POA: Diagnosis not present

## 2021-07-30 DIAGNOSIS — C50919 Malignant neoplasm of unspecified site of unspecified female breast: Secondary | ICD-10-CM | POA: Insufficient documentation

## 2021-07-30 DIAGNOSIS — J449 Chronic obstructive pulmonary disease, unspecified: Secondary | ICD-10-CM | POA: Insufficient documentation

## 2021-07-30 DIAGNOSIS — I10 Essential (primary) hypertension: Secondary | ICD-10-CM | POA: Insufficient documentation

## 2021-07-30 DIAGNOSIS — Z923 Personal history of irradiation: Secondary | ICD-10-CM | POA: Insufficient documentation

## 2021-07-30 DIAGNOSIS — Z7982 Long term (current) use of aspirin: Secondary | ICD-10-CM | POA: Insufficient documentation

## 2021-07-30 DIAGNOSIS — Z79899 Other long term (current) drug therapy: Secondary | ICD-10-CM | POA: Insufficient documentation

## 2021-07-30 DIAGNOSIS — G620 Drug-induced polyneuropathy: Secondary | ICD-10-CM | POA: Insufficient documentation

## 2021-07-30 DIAGNOSIS — G893 Neoplasm related pain (acute) (chronic): Secondary | ICD-10-CM | POA: Insufficient documentation

## 2021-07-30 DIAGNOSIS — R0602 Shortness of breath: Secondary | ICD-10-CM | POA: Insufficient documentation

## 2021-07-30 DIAGNOSIS — N393 Stress incontinence (female) (male): Secondary | ICD-10-CM | POA: Insufficient documentation

## 2021-07-30 DIAGNOSIS — C773 Secondary and unspecified malignant neoplasm of axilla and upper limb lymph nodes: Secondary | ICD-10-CM | POA: Insufficient documentation

## 2021-07-30 DIAGNOSIS — C7971 Secondary malignant neoplasm of right adrenal gland: Secondary | ICD-10-CM | POA: Insufficient documentation

## 2021-07-30 DIAGNOSIS — Z87891 Personal history of nicotine dependence: Secondary | ICD-10-CM | POA: Insufficient documentation

## 2021-07-30 NOTE — H&P (View-Only) (Signed)
PATIENT PROFILE: Amber Blevins is a 70 y.o. female who presents to the Clinic for consultation at the request of Dr. Tasia Catchings for evaluation of insertion of Chemo-Port.  PCP:  Denyse Amass, MD  HISTORY OF PRESENT ILLNESS: Amber Blevins reports she was diagnosed with stage IV breast cancer.  She has been receiving chemotherapy through peripheral vein.  There has been very difficult.  Patient has been evaluated and treated by medical oncology.  They recommended to the discuss Chemo-Port placement.  Patient denies any chest pain.  Patient denies any shortness of breath.  Patient is able to ambulate and perform 4 METS.   PROBLEM LIST: Problem List  Date Reviewed: 06/23/2021          Noted   Osteopenia of spine 06/10/2021   Overview    1.4      Osteopenia of left forearm 06/10/2021   Overview    1.8      Aortic atherosclerosis (CMS-HCC) 04/21/2021   Overview    Incidental finding on lumbar films      Hyperkalemia 06/07/2020   Hip pain, bilateral 03/21/2019   Overview    Pt hoping that coming off CA hormonal tx will help the bone pains.       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           Hx of breast cancer 02/13/2013   Overview    Breast cancer left-02/12/2009, s/p lumpectomy and radiation july thru aug 2010-[facility] record acess thru epic [facility]  [facility] and pt is released back to PCP care: pt future follow ups for mammogram/breast examine can be follow up by PCP . No further needs at this time      Varicose vein, bil 02/13/2013       GENERAL REVIEW OF SYSTEMS:   General ROS: negative for - chills, fatigue, fever, weight gain or weight loss Allergy and Immunology ROS: negative for - hives  Hematological and Lymphatic ROS: negative for - bleeding problems or bruising, negative for palpable nodes Endocrine ROS: negative for - heat or cold intolerance, hair changes Respiratory ROS: Positive for - cough, shortness of breath or wheezing Cardiovascular ROS: no chest pain or palpitations GI ROS: negative for nausea, vomiting, abdominal pain, diarrhea, constipation Musculoskeletal ROS: negative for - joint swelling or muscle pain Neurological ROS: negative for - confusion, syncope Dermatological ROS: negative for pruritus and rash Psychiatric: negative for anxiety, depression, difficulty sleeping and memory loss  MEDICATIONS: Current Outpatient Medications  Medication Sig Dispense Refill   acetaminophen (TYLENOL) 500 MG tablet Take 1,300 mg by mouth     albuterol 90 mcg/actuation inhaler Inhale 2 inhalations into the lungs every 6 (six) hours as needed 1 each 1   aspirin 81 MG EC tablet Take 1 tablet (81 mg total) by mouth once daily 30 tablet 11   atorvastatin (LIPITOR)  40 MG tablet Take 1 tablet (40 mg total) by mouth once daily 90 tablet 3   calcium carbonate-vitamin D3 600 mg(1,532m) -500 unit Cap Take 1 capsule by mouth once daily 30 capsule 11   clobetasol (CLOBEX) 0.05 % lotion Apply topically 2 (two) times daily.     fluticasone-umeclidinium-vilanterol (TRELEGY ELLIPTA) 100-62.5-25 mcg inhaler Inhale 1 inhalation into the lungs once daily 1 each 6   gabapentin (NEURONTIN) 300 MG capsule Take 2 capsules (600 mg total) by mouth 2 (two) times daily (Patient taking differently: Take 600 mg by mouth 3 (three) times daily) 120 capsule  11   glucosamine-D3-Boswellia serr 1,500-400-100 mg-unit-mg Tab Take 1 tablet by mouth     MAGNESIUM OXIDE (MAG-OXIDE ORAL) Take 250 mg by mouth once daily.     metoprolol-hydrochlorothiazide (LOPRESSOR HCT) 50-25 mg tablet Take 1 tablet by mouth once daily 30 tablet 11   nystatin (MYCOSTATIN) 100,000 unit/gram ointment Apply topically 2 (two) times daily 30 g 11   baclofen (LIORESAL) 10 MG tablet Take 10 mg by mouth 3 (three) times daily (Patient not taking: Reported on 07/29/2021)     docusate (COLACE) 100 MG capsule Take 100 mg by mouth 2 (two) times daily (Patient not taking: Reported on 07/29/2021)     polyethylene glycol (MIRALAX) packet Take 17 g by mouth once daily Mix in 4-8ounces of fluid prior to taking. (Patient not taking: No sig reported)     sod sulf-pot chloride-mag sulf (SUTAB) 1.479-0.188- 0.225 gram Tab Take 24 tablets by mouth as directed (Patient not taking: No sig reported) 24 tablet 0   No current facility-administered medications for this visit.    ALLERGIES: Green tea (camellia sinensis), Tea tree oil, and Lisinopril  PAST MEDICAL HISTORY: Past Medical History:  Diagnosis Date   Cancer (CMS-HCC)    breast left    PAST SURGICAL HISTORY: Past Surgical History:  Procedure Laterality Date   CATARACT EXTRACTION     left   left breast lumpectomy     vein stripping  1985     FAMILY HISTORY: Family History  Problem Relation Age of Onset   Lung cancer Mother    Myocardial Infarction (Heart attack) Father    Diabetes type II Father    Stroke Father    Coronary Artery Disease (Blocked arteries around heart) Father    Valvular heart disease Father      SOCIAL HISTORY: Social History   Socioeconomic History   Marital status: Married    Spouse name: FJosph Macho Tobacco Use   Smoking status: Former Smoker    Types: Cigarettes    Start date: 09/29/1963    Quit date: 09/28/1988    Years since quitting: 32.8   Smokeless tobacco: Never Used  Vaping Use   Vaping Use:  Never used  Substance and Sexual Activity   Alcohol use: Yes    Alcohol/week: 0.0 standard drinks    Comment: occ   Drug use: No   Sexual activity: Not Currently  Social History Narrative   SH:   married, retired from [employer]ologist, 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 Last screening Date: June 2022 Screening is: Up to date   Vision: Denies any issues with Vision","Wears Reading glasses","Prescription Glasses","Contact Lenses"}.. Last screening Date: August 2018 Screening is: In need   Hearing: Denies any issues in Hearing .    Dermatology: Denies any areas of concern on skin.      PHYSICAL EXAM: Vitals:   07/29/21 1129  BP: (!) 153/76  Pulse: 74   Body mass index is 30.21 kg/m. Weight: 79.8 kg (176 lb)   GENERAL: Alert, active, oriented x3  HEENT: Pupils equal reactive to light. Extraocular movements are intact. Sclera clear. Palpebral conjunctiva normal red color.Pharynx clear.  NECK: Supple with no palpable mass and no adenopathy.  LUNGS: Sound clear with no rales rhonchi or wheezes.  HEART: Regular rhythm S1 and S2 without murmur.  ABDOMEN: Soft and depressible, nontender with no palpable mass, no hepatomegaly.   EXTREMITIES: Well-developed well-nourished symmetrical with no dependent edema.  NEUROLOGICAL: Awake alert oriented, facial expression symmetrical, moving all extremities.  REVIEW OF DATA: I have reviewed the following data today: Initial consult on 07/29/2021  Component  Date Value   WBC (White Blood Cell Co* 07/29/2021 10.7 (!)   RBC (Red Blood Cell Coun* 07/29/2021 4.74    Hemoglobin 07/29/2021 13.6    Hematocrit 07/29/2021 42.2    MCV (Mean Corpuscular Vo* 07/29/2021 89.0    MCH (Mean Corpuscular He* 07/29/2021 28.7    MCHC (Mean Corpuscular H* 07/29/2021 32.2    Platelet Count 07/29/2021 437    RDW-CV (Red Cell Distrib* 07/29/2021 14.6    MPV (Mean Platelet Volum* 07/29/2021 9.6    Neutrophils 07/29/2021 9.26 (!)   Lymphocytes 07/29/2021 0.49 (!)   Monocytes 07/29/2021 0.73    Eosinophils 07/29/2021 0.07    Basophils 07/29/2021 0.02    Neutrophil % 07/29/2021 86.7 (!)   Lymphocyte % 07/29/2021 4.6 (!)   Monocyte % 07/29/2021 6.8    Eosinophil % 07/29/2021 0.7 (!)   Basophil% 07/29/2021 0.2    Immature Granulocyte % 07/29/2021 1.0 (!)   Immature Granulocyte Cou* 07/29/2021 0.11 (!)   Glucose 07/29/2021 100    Sodium 07/29/2021 135 (!)   Potassium 07/29/2021 4.1    Chloride 07/29/2021 94 (!)   Carbon Dioxide (CO2) 07/29/2021 35.9 (!)   Urea Nitrogen (BUN) 07/29/2021 21    Creatinine 07/29/2021 0.6    Glomerular Filtration Ra* 07/29/2021 99    Calcium 07/29/2021 8.8    AST  07/29/2021 27    ALT  07/29/2021 20    Alk Phos (alkaline Phosp* 07/29/2021 114 (!)   Albumin 07/29/2021 4.1    Bilirubin, Total 07/29/2021 0.9    Protein, Total 07/29/2021 6.3    A/G Ratio 07/29/2021 1.9   Appointment on 06/27/2021  Component Date Value   WBC (White Blood Cell Co* 06/27/2021 10.0 (!)   Hemoglobin 06/27/2021 13.8    Hematocrit 06/27/2021 42.0    Platelets 06/27/2021 414    MCV (Mean Corpuscular Vo* 06/27/2021 88    MCH (Mean Corpuscular He* 06/27/2021 29.1    MCHC (Mean Corpuscular H* 06/27/2021 32.9    RBC (Red Blood Cell Coun* 06/27/2021 4.75    RDW-CV (Red Cell Distrib* 06/27/2021 14.5    MPV (Mean Platelet Volum* 06/27/2021 8.7    Neutrophil Count 06/27/2021 7.1    Neutrophil % 06/27/2021 70.8    Lymphocyte  Count 06/27/2021 1.9     Lymphocyte % 06/27/2021 19.1    Monocyte Count 06/27/2021 0.8    Monocyte % 06/27/2021 8.4    Eosinophil Count 06/27/2021 0.14    Eosinophil % 06/27/2021 1.4    Basophil Count 06/27/2021 0.03    Basophil % 06/27/2021 0.3   Office Visit on 06/10/2021  Component Date Value   Vent Rate (bpm) 06/10/2021 69    PR Interval (msec) 06/10/2021 166    QRS Interval (msec) 06/10/2021 86    QT Interval (msec) 06/10/2021 406    QTc (msec) 06/10/2021 435    WBC (White Blood Cell Co* 06/10/2021 10.7 (!)   Hemoglobin 06/10/2021 13.4    Hematocrit 06/10/2021 40.9    Platelets 06/10/2021 350    MCV (Mean Corpuscular Vo* 06/10/2021 88    MCH (Mean Corpuscular He* 06/10/2021 28.9    MCHC (Mean Corpuscular H* 06/10/2021 32.8    RBC (Red Blood Cell Coun* 06/10/2021 4.64    RDW-CV (Red Cell Distrib* 06/10/2021 15.0 (!)   MPV (Mean Platelet Volum* 06/10/2021 8.8    Neutrophil Count 06/10/2021 7.9    Neutrophil % 06/10/2021 73.8    Lymphocyte Count 06/10/2021 1.7    Lymphocyte % 06/10/2021 15.9    Monocyte Count 06/10/2021 1.0 (!)   Monocyte % 06/10/2021 9.4    Eosinophil Count 06/10/2021 0.08    Eosinophil % 06/10/2021 0.7    Basophil Count 06/10/2021 0.02    Basophil % 06/10/2021 0.2    Sodium 06/10/2021 140    Potassium 06/10/2021 4.4    Chloride 06/10/2021 101    Carbon Dioxide (CO2) 06/10/2021 29    Urea Nitrogen (BUN) 06/10/2021 16    Creatinine 06/10/2021 0.8    Glucose 06/10/2021 106    Calcium 06/10/2021 9.7    AST (Aspartate Aminotran* 06/10/2021 30    ALT (Alanine Aminotransf* 06/10/2021 18    Bilirubin, Total 06/10/2021 1.1    Alk Phos (Alkaline Phosp* 06/10/2021 97    Albumin 06/10/2021 3.8    Protein, Total 06/10/2021 7.1    Anion Gap 06/10/2021 10    BUN/CREA Ratio 06/10/2021 20    Glomerular Filtration Ra* 06/10/2021 79   Office Visit on 05/26/2021  Component Date Value   Cholesterol, Total 05/26/2021 143    LDL Calculated 05/26/2021 50    HDL 05/26/2021 58     Triglyceride 05/26/2021 176    Sodium 05/26/2021 140    Potassium 05/26/2021 4.6    Chloride 05/26/2021 101    Carbon Dioxide (CO2) 05/26/2021 30    Urea Nitrogen (BUN) 05/26/2021 14    Creatinine 05/26/2021 0.8    Glucose 05/26/2021 84    Calcium 05/26/2021 10.1    Anion Gap 05/26/2021 9    BUN/CREA Ratio 05/26/2021 18    Glomerular Filtration Ra* 05/26/2021 79   Ancillary Orders on 05/23/2021  Component Date Value   Sodium 05/23/2021 136    Potassium 05/23/2021 5.1 (!)   Chloride 05/23/2021 97 (!)   Carbon Dioxide (CO2) 05/23/2021 30    Urea Nitrogen (BUN) 05/23/2021 16    Creatinine 05/23/2021 0.8    Glucose 05/23/2021 106    Calcium 05/23/2021 9.9    Anion Gap 05/23/2021 9    BUN/CREA Ratio 05/23/2021 20    Glomerular Filtration Ra* 05/23/2021 79    WBC (White Blood Cell Co* 05/23/2021 7.8    Hemoglobin 05/23/2021 13.9    Hematocrit 05/23/2021 41.9    Platelets 05/23/2021 351    MCV (Mean Corpuscular  Vo* 05/23/2021 88    MCH (Mean Corpuscular He* 05/23/2021 29.2    MCHC (Mean Corpuscular H* 05/23/2021 33.2    RBC (Red Blood Cell Coun* 05/23/2021 4.76    RDW-CV (Red Cell Distrib* 05/23/2021 14.9 (!)   MPV (Mean Platelet Volum* 05/23/2021 9.5    Neutrophil Count 05/23/2021 5.3    Neutrophil % 05/23/2021 67.7    Lymphocyte Count 05/23/2021 1.6    Lymphocyte % 05/23/2021 21.1    Monocyte Count 05/23/2021 0.6    Monocyte % 05/23/2021 7.7    Eosinophil Count 05/23/2021 0.24    Eosinophil % 05/23/2021 3.1    Basophil Count 05/23/2021 0.03    Basophil % 05/23/2021 0.4    Hemoglobin A1C 05/23/2021 5.5    Average Blood Glucose (C* 05/23/2021 108      ASSESSMENT: Amber Blevins is a 70 y.o. female presenting for consultation for insertion of Chemo-Port.  Patient with advanced breast cancer.  She was oriented about Chemo-Port placement.  She was treated with the procedure, its benefits and the risk.  She was oriented about the risk including bleeding, infection,  pneumothorax, hemothorax, arteriovenous fistula, pain, deep venous thrombosis, among others.  She endorses she understood and agreed to proceed.  Metastatic breast cancer (CMS-HCC) [C50.919]  PLAN: 1. Insertion of Port a Cath [phone], [id number], [id number]) 2. CBC, CMP  3. Hold aspirin starting tomorrow 4. Contact us if has any question or concern.    Patient and verbalized understanding, all questions were answered, and were agreeable with the plan outlined above.     Herbert Pun, MD  Electronically signed by Herbert Pun, MD

## 2021-07-30 NOTE — Progress Notes (Signed)
Multidisciplinary Oncology Council Documentation  Amber Blevins was presented by our [facility] on 07/30/2021, which included representatives from:  Palliative Care Dietitian  Physical/Occupational Therapist Nurse Navigator Genetics Speech Therapist Social work Survivorship RN Financial Navigator Research RN   Amber Blevins currently presents with history of metastatic breast cancer  We reviewed previous medical and familial history, history of present illness, and recent lab results along with all available histopathologic and imaging studies. The [place] considered available treatment options and made the following recommendations/referrals:  Recommend palliative care, nutrition and rehab screening.   The MOC is a meeting of clinicians from various specialty areas who evaluate and discuss patients for whom a multidisciplinary approach is being considered. Final determinations in the plan of care are those of the provider(s).   Today's extended care, comprehensive team conference, Amber Blevins was not present for the discussion and was not examined.

## 2021-07-30 NOTE — H&P (Signed)
PATIENT PROFILE: Amber Blevins is a 70 y.o. female who presents to the Clinic for consultation at the request of Dr. Tasia Catchings for evaluation of insertion of Chemo-Port.  PCP:  Denyse Amass, MD  HISTORY OF PRESENT ILLNESS: Ms. Cutshaw reports she was diagnosed with stage IV breast cancer.  She has been receiving chemotherapy through peripheral vein.  There has been very difficult.  Patient has been evaluated and treated by medical oncology.  They recommended to the discuss Chemo-Port placement.  Patient denies any chest pain.  Patient denies any shortness of breath.  Patient is able to ambulate and perform 4 METS.   PROBLEM LIST: Problem List  Date Reviewed: 06/23/2021          Noted   Osteopenia of spine 06/10/2021   Overview    1.4      Osteopenia of left forearm 06/10/2021   Overview    1.8      Aortic atherosclerosis (CMS-HCC) 04/21/2021   Overview    Incidental finding on lumbar films      Hyperkalemia 06/07/2020   Hip pain, bilateral 03/21/2019   Overview    Pt hoping that coming off CA hormonal tx will help the bone pains.       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           Hx of breast cancer 02/13/2013   Overview    Breast cancer left-02/12/2009, s/p lumpectomy and radiation july thru aug 2010-[facility] record acess thru epic [facility] that [facility]  [facility] and pt is released back to PCP care: pt future follow ups for mammogram/breast examine can be follow up by PCP . No further needs at this time      Varicose vein, bil 02/13/2013       GENERAL REVIEW OF SYSTEMS:   General ROS: negative for - chills, fatigue, fever, weight gain or weight loss Allergy and Immunology ROS: negative for - hives  Hematological and Lymphatic ROS: negative for - bleeding problems or bruising, negative for palpable nodes Endocrine ROS: negative for - heat or cold intolerance, hair changes Respiratory ROS: Positive for - cough, shortness of breath or wheezing Cardiovascular ROS: no chest pain or palpitations GI ROS: negative for nausea, vomiting, abdominal pain, diarrhea, constipation Musculoskeletal ROS: negative for - joint swelling or muscle pain Neurological ROS: negative for - confusion, syncope Dermatological ROS: negative for pruritus and rash Psychiatric: negative for anxiety, depression, difficulty sleeping and memory loss  MEDICATIONS: Current Outpatient Medications  Medication Sig Dispense Refill   acetaminophen (TYLENOL) 500 MG tablet Take 1,300 mg by mouth     albuterol 90 mcg/actuation inhaler Inhale 2 inhalations into the lungs every 6 (six) hours as needed 1 each 1   aspirin 81 MG EC tablet Take 1 tablet (81 mg total) by mouth once daily 30 tablet 11   atorvastatin (LIPITOR)  40 MG tablet Take 1 tablet (40 mg total) by mouth once daily 90 tablet 3   calcium carbonate-vitamin D3 600 mg(1,532m) -500 unit Cap Take 1 capsule by mouth once daily 30 capsule 11   clobetasol (CLOBEX) 0.05 % lotion Apply topically 2 (two) times daily.     fluticasone-umeclidinium-vilanterol (TRELEGY ELLIPTA) 100-62.5-25 mcg inhaler Inhale 1 inhalation into the lungs once daily 1 each 6   gabapentin (NEURONTIN) 300 MG capsule Take 2 capsules (600 mg total) by mouth 2 (two) times daily (Patient taking differently: Take 600 mg by mouth 3 (three) times daily) 120 capsule  11   glucosamine-D3-Boswellia serr 1,500-400-100 mg-unit-mg Tab Take 1 tablet by mouth     MAGNESIUM OXIDE (MAG-OXIDE ORAL) Take 250 mg by mouth once daily.     metoprolol-hydrochlorothiazide (LOPRESSOR HCT) 50-25 mg tablet Take 1 tablet by mouth once daily 30 tablet 11   nystatin (MYCOSTATIN) 100,000 unit/gram ointment Apply topically 2 (two) times daily 30 g 11   baclofen (LIORESAL) 10 MG tablet Take 10 mg by mouth 3 (three) times daily (Patient not taking: Reported on 07/29/2021)     docusate (COLACE) 100 MG capsule Take 100 mg by mouth 2 (two) times daily (Patient not taking: Reported on 07/29/2021)     polyethylene glycol (MIRALAX) packet Take 17 g by mouth once daily Mix in 4-8ounces of fluid prior to taking. (Patient not taking: No sig reported)     sod sulf-pot chloride-mag sulf (SUTAB) 1.479-0.188- 0.225 gram Tab Take 24 tablets by mouth as directed (Patient not taking: No sig reported) 24 tablet 0   No current facility-administered medications for this visit.    ALLERGIES: Green tea (camellia sinensis), Tea tree oil, and Lisinopril  PAST MEDICAL HISTORY: Past Medical History:  Diagnosis Date   Cancer (CMS-HCC)    breast left    PAST SURGICAL HISTORY: Past Surgical History:  Procedure Laterality Date   CATARACT EXTRACTION     left   left breast lumpectomy     vein stripping  1985     FAMILY HISTORY: Family History  Problem Relation Age of Onset   Lung cancer Mother    Myocardial Infarction (Heart attack) Father    Diabetes type II Father    Stroke Father    Coronary Artery Disease (Blocked arteries around heart) Father    Valvular heart disease Father      SOCIAL HISTORY: Social History   Socioeconomic History   Marital status: Married    Spouse name: FJosph Macho Tobacco Use   Smoking status: Former Smoker    Types: Cigarettes    Start date: 09/29/1963    Quit date: 09/28/1988    Years since quitting: 32.8   Smokeless tobacco: Never Used  Vaping Use   Vaping Use:  Never used  Substance and Sexual Activity   Alcohol use: Yes    Alcohol/week: 0.0 standard drinks    Comment: occ   Drug use: No   Sexual activity: Not Currently  Social History Narrative   SH:   married, retired from [employer], 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 Last screening Date: June 2022 Screening is: Up to date   Vision: Denies any issues with Vision","Wears Reading glasses","Prescription Glasses","Contact Lenses"}.. Last screening Date: August 2018 Screening is: In need   Hearing: Denies any issues in Hearing .    Dermatology: Denies any areas of concern on skin.      PHYSICAL EXAM: Vitals:   07/29/21 1129  BP: (!) 153/76  Pulse: 74   Body mass index is 30.21 kg/m. Weight: 79.8 kg (176 lb)   GENERAL: Alert, active, oriented x3  HEENT: Pupils equal reactive to light. Extraocular movements are intact. Sclera clear. Palpebral conjunctiva normal red color.Pharynx clear.  NECK: Supple with no palpable mass and no adenopathy.  LUNGS: Sound clear with no rales rhonchi or wheezes.  HEART: Regular rhythm S1 and S2 without murmur.  ABDOMEN: Soft and depressible, nontender with no palpable mass, no hepatomegaly.   EXTREMITIES: Well-developed well-nourished symmetrical with no dependent edema.  NEUROLOGICAL: Awake alert oriented, facial expression symmetrical, moving all extremities.  REVIEW OF DATA: I have reviewed the following data today: Initial consult on 07/29/2021  Component  Date Value   WBC (White Blood Cell Co* 07/29/2021 10.7 (!)   RBC (Red Blood Cell Coun* 07/29/2021 4.74    Hemoglobin 07/29/2021 13.6    Hematocrit 07/29/2021 42.2    MCV (Mean Corpuscular Vo* 07/29/2021 89.0    MCH (Mean Corpuscular He* 07/29/2021 28.7    MCHC (Mean Corpuscular H* 07/29/2021 32.2    Platelet Count 07/29/2021 437    RDW-CV (Red Cell Distrib* 07/29/2021 14.6    MPV (Mean Platelet Volum* 07/29/2021 9.6    Neutrophils 07/29/2021 9.26 (!)   Lymphocytes 07/29/2021 0.49 (!)   Monocytes 07/29/2021 0.73    Eosinophils 07/29/2021 0.07    Basophils 07/29/2021 0.02    Neutrophil % 07/29/2021 86.7 (!)   Lymphocyte % 07/29/2021 4.6 (!)   Monocyte % 07/29/2021 6.8    Eosinophil % 07/29/2021 0.7 (!)   Basophil% 07/29/2021 0.2    Immature Granulocyte % 07/29/2021 1.0 (!)   Immature Granulocyte Cou* 07/29/2021 0.11 (!)   Glucose 07/29/2021 100    Sodium 07/29/2021 135 (!)   Potassium 07/29/2021 4.1    Chloride 07/29/2021 94 (!)   Carbon Dioxide (CO2) 07/29/2021 35.9 (!)   Urea Nitrogen (BUN) 07/29/2021 21    Creatinine 07/29/2021 0.6    Glomerular Filtration Ra* 07/29/2021 99    Calcium 07/29/2021 8.8    AST  07/29/2021 27    ALT  07/29/2021 20    Alk Phos (alkaline Phosp* 07/29/2021 114 (!)   Albumin 07/29/2021 4.1    Bilirubin, Total 07/29/2021 0.9    Protein, Total 07/29/2021 6.3    A/G Ratio 07/29/2021 1.9   Appointment on 06/27/2021  Component Date Value   WBC (White Blood Cell Co* 06/27/2021 10.0 (!)   Hemoglobin 06/27/2021 13.8    Hematocrit 06/27/2021 42.0    Platelets 06/27/2021 414    MCV (Mean Corpuscular Vo* 06/27/2021 88    MCH (Mean Corpuscular He* 06/27/2021 29.1    MCHC (Mean Corpuscular H* 06/27/2021 32.9    RBC (Red Blood Cell Coun* 06/27/2021 4.75    RDW-CV (Red Cell Distrib* 06/27/2021 14.5    MPV (Mean Platelet Volum* 06/27/2021 8.7    Neutrophil Count 06/27/2021 7.1    Neutrophil % 06/27/2021 70.8    Lymphocyte  Count 06/27/2021 1.9     Lymphocyte % 06/27/2021 19.1    Monocyte Count 06/27/2021 0.8    Monocyte % 06/27/2021 8.4    Eosinophil Count 06/27/2021 0.14    Eosinophil % 06/27/2021 1.4    Basophil Count 06/27/2021 0.03    Basophil % 06/27/2021 0.3   Office Visit on 06/10/2021  Component Date Value   Vent Rate (bpm) 06/10/2021 69    PR Interval (msec) 06/10/2021 166    QRS Interval (msec) 06/10/2021 86    QT Interval (msec) 06/10/2021 406    QTc (msec) 06/10/2021 435    WBC (White Blood Cell Co* 06/10/2021 10.7 (!)   Hemoglobin 06/10/2021 13.4    Hematocrit 06/10/2021 40.9    Platelets 06/10/2021 350    MCV (Mean Corpuscular Vo* 06/10/2021 88    MCH (Mean Corpuscular He* 06/10/2021 28.9    MCHC (Mean Corpuscular H* 06/10/2021 32.8    RBC (Red Blood Cell Coun* 06/10/2021 4.64    RDW-CV (Red Cell Distrib* 06/10/2021 15.0 (!)   MPV (Mean Platelet Volum* 06/10/2021 8.8    Neutrophil Count 06/10/2021 7.9    Neutrophil % 06/10/2021 73.8    Lymphocyte Count 06/10/2021 1.7    Lymphocyte % 06/10/2021 15.9    Monocyte Count 06/10/2021 1.0 (!)   Monocyte % 06/10/2021 9.4    Eosinophil Count 06/10/2021 0.08    Eosinophil % 06/10/2021 0.7    Basophil Count 06/10/2021 0.02    Basophil % 06/10/2021 0.2    Sodium 06/10/2021 140    Potassium 06/10/2021 4.4    Chloride 06/10/2021 101    Carbon Dioxide (CO2) 06/10/2021 29    Urea Nitrogen (BUN) 06/10/2021 16    Creatinine 06/10/2021 0.8    Glucose 06/10/2021 106    Calcium 06/10/2021 9.7    AST (Aspartate Aminotran* 06/10/2021 30    ALT (Alanine Aminotransf* 06/10/2021 18    Bilirubin, Total 06/10/2021 1.1    Alk Phos (Alkaline Phosp* 06/10/2021 97    Albumin 06/10/2021 3.8    Protein, Total 06/10/2021 7.1    Anion Gap 06/10/2021 10    BUN/CREA Ratio 06/10/2021 20    Glomerular Filtration Ra* 06/10/2021 79   Office Visit on 05/26/2021  Component Date Value   Cholesterol, Total 05/26/2021 143    LDL Calculated 05/26/2021 50    HDL 05/26/2021 58     Triglyceride 05/26/2021 176    Sodium 05/26/2021 140    Potassium 05/26/2021 4.6    Chloride 05/26/2021 101    Carbon Dioxide (CO2) 05/26/2021 30    Urea Nitrogen (BUN) 05/26/2021 14    Creatinine 05/26/2021 0.8    Glucose 05/26/2021 84    Calcium 05/26/2021 10.1    Anion Gap 05/26/2021 9    BUN/CREA Ratio 05/26/2021 18    Glomerular Filtration Ra* 05/26/2021 79   Ancillary Orders on 05/23/2021  Component Date Value   Sodium 05/23/2021 136    Potassium 05/23/2021 5.1 (!)   Chloride 05/23/2021 97 (!)   Carbon Dioxide (CO2) 05/23/2021 30    Urea Nitrogen (BUN) 05/23/2021 16    Creatinine 05/23/2021 0.8    Glucose 05/23/2021 106    Calcium 05/23/2021 9.9    Anion Gap 05/23/2021 9    BUN/CREA Ratio 05/23/2021 20    Glomerular Filtration Ra* 05/23/2021 79    WBC (White Blood Cell Co* 05/23/2021 7.8    Hemoglobin 05/23/2021 13.9    Hematocrit 05/23/2021 41.9    Platelets 05/23/2021 351    MCV (Mean Corpuscular  Vo* 05/23/2021 88    MCH (Mean Corpuscular He* 05/23/2021 29.2    MCHC (Mean Corpuscular H* 05/23/2021 33.2    RBC (Red Blood Cell Coun* 05/23/2021 4.76    RDW-CV (Red Cell Distrib* 05/23/2021 14.9 (!)   MPV (Mean Platelet Volum* 05/23/2021 9.5    Neutrophil Count 05/23/2021 5.3    Neutrophil % 05/23/2021 67.7    Lymphocyte Count 05/23/2021 1.6    Lymphocyte % 05/23/2021 21.1    Monocyte Count 05/23/2021 0.6    Monocyte % 05/23/2021 7.7    Eosinophil Count 05/23/2021 0.24    Eosinophil % 05/23/2021 3.1    Basophil Count 05/23/2021 0.03    Basophil % 05/23/2021 0.4    Hemoglobin A1C 05/23/2021 5.5    Average Blood Glucose (C* 05/23/2021 108      ASSESSMENT: Ms. Lei is a 70 y.o. female presenting for consultation for insertion of Chemo-Port.  Patient with advanced breast cancer.  She was oriented about Chemo-Port placement.  She was treated with the procedure, its benefits and the risk.  She was oriented about the risk including bleeding, infection,  pneumothorax, hemothorax, arteriovenous fistula, pain, deep venous thrombosis, among others.  She endorses she understood and agreed to proceed.  Metastatic breast cancer (CMS-HCC) [C50.919]  PLAN: 1. Insertion of Port a Cath [phone], [id number], [id number]) 2. CBC, CMP  3. Hold aspirin starting tomorrow 4. Contact us if has any question or concern.    Patient and verbalized understanding, all questions were answered, and were agreeable with the plan outlined above.     Herbert Pun, MD  Electronically signed by Herbert Pun, MD

## 2021-07-31 ENCOUNTER — Ambulatory Visit
Admission: RE | Admit: 2021-07-31 | Discharge: 2021-07-31 | Disposition: A | Discharge status: Home / Self Care | Payer: Medicare PPO | Source: Ambulatory Visit | Attending: Radiation Oncology | Admitting: Radiation Oncology

## 2021-07-31 DIAGNOSIS — Z5111 Encounter for antineoplastic chemotherapy: Secondary | ICD-10-CM | POA: Diagnosis not present

## 2021-08-01 ENCOUNTER — Ambulatory Visit: Payer: Medicare PPO

## 2021-08-01 ENCOUNTER — Ambulatory Visit
Admission: RE | Admit: 2021-08-01 | Discharge: 2021-08-01 | Disposition: A | Discharge status: Home / Self Care | Payer: Medicare PPO | Attending: General Surgery | Admitting: General Surgery

## 2021-08-01 ENCOUNTER — Encounter
Admission: RE | Disposition: A | Discharge status: Home / Self Care | Payer: Self-pay | Source: Home / Self Care | Attending: General Surgery

## 2021-08-01 ENCOUNTER — Ambulatory Visit: Payer: Medicare PPO | Admitting: Anesthesiology

## 2021-08-01 ENCOUNTER — Encounter: Payer: Self-pay | Admitting: General Surgery

## 2021-08-01 ENCOUNTER — Ambulatory Visit
Admission: RE | Admit: 2021-08-01 | Discharge: 2021-08-01 | Disposition: A | Discharge status: Home / Self Care | Payer: Medicare PPO | Source: Ambulatory Visit | Attending: Radiation Oncology | Admitting: Radiation Oncology

## 2021-08-01 DIAGNOSIS — Z7951 Long term (current) use of inhaled steroids: Secondary | ICD-10-CM | POA: Diagnosis not present

## 2021-08-01 DIAGNOSIS — Z7982 Long term (current) use of aspirin: Secondary | ICD-10-CM | POA: Insufficient documentation

## 2021-08-01 DIAGNOSIS — Z79899 Other long term (current) drug therapy: Secondary | ICD-10-CM | POA: Diagnosis not present

## 2021-08-01 DIAGNOSIS — Z888 Allergy status to other drugs, medicaments and biological substances status: Secondary | ICD-10-CM | POA: Insufficient documentation

## 2021-08-01 DIAGNOSIS — C50911 Malignant neoplasm of unspecified site of right female breast: Secondary | ICD-10-CM | POA: Insufficient documentation

## 2021-08-01 DIAGNOSIS — Z87891 Personal history of nicotine dependence: Secondary | ICD-10-CM | POA: Diagnosis not present

## 2021-08-01 DIAGNOSIS — Z95828 Presence of other vascular implants and grafts: Secondary | ICD-10-CM

## 2021-08-01 HISTORY — PX: PORTACATH PLACEMENT: SHX2246

## 2021-08-01 IMAGING — DX DG CHEST 1V PORT
1 series · 1 of 1 positions shown · non-contrast
Comparison: [DATE]

CLINICAL DATA: Port-A-Cath placement.  Breast carcinoma.

EXAM:
PORTABLE CHEST 1 VIEW

[chest ap]
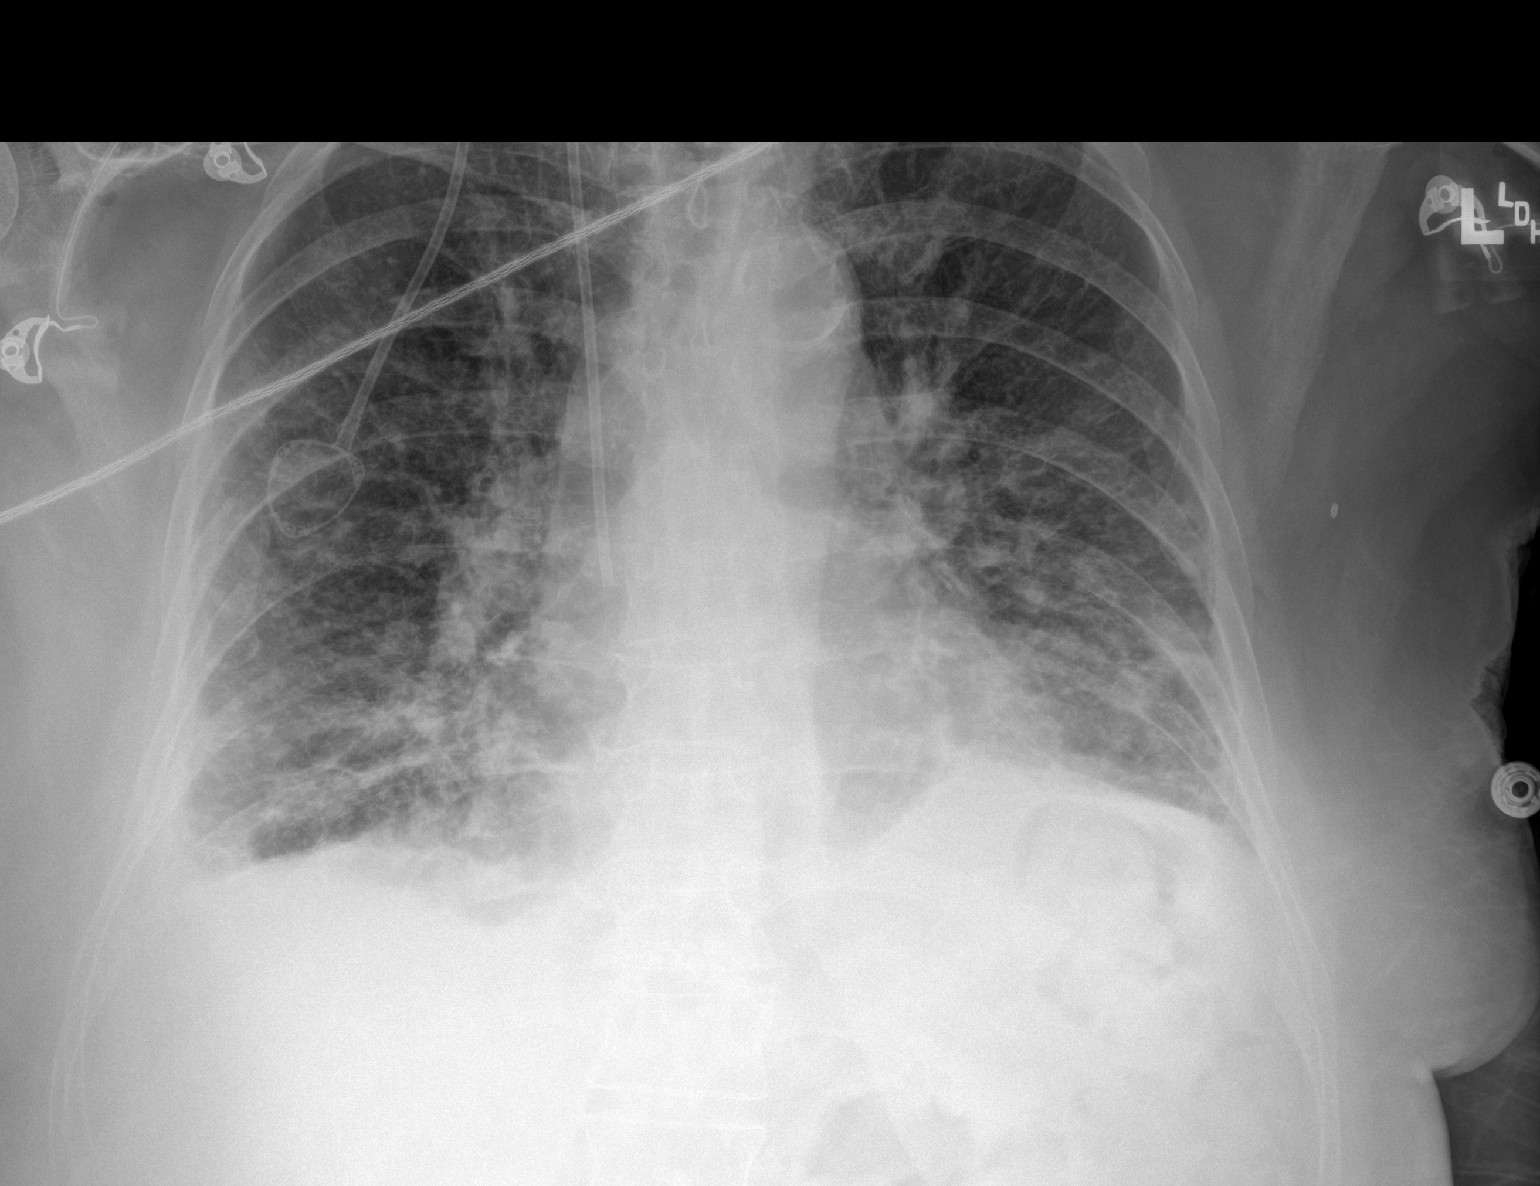

[1 of 1 positions shown; findings below may reference images not displayed]

FINDINGS: Right-sided Port-A-Cath is now seen with tip overlying the superior
cavoatrial junction. No evidence of pneumothorax.

Diffuse interstitial infiltrates are again seen, without significant
change. Probable small right pleural effusion is also stable. Heart
size is within normal limits. Aortic atherosclerotic calcification
noted.
IMPRESSION: Right-sided Port-A-Cath in appropriate position. No pneumothorax
visualized.

Stable diffuse interstitial infiltrates and probable small right
pleural effusion.

## 2021-08-01 SURGERY — INSERTION, TUNNELED CENTRAL VENOUS DEVICE, WITH PORT
Anesthesia: General | Site: Chest

## 2021-08-01 MED ORDER — FAMOTIDINE 20 MG PO TABS
ORAL_TABLET | ORAL | Status: AC
  Filled 2021-08-01: qty 1

## 2021-08-01 MED ORDER — FENTANYL CITRATE (PF) 100 MCG/2ML IJ SOLN: 25.0000 ug | INTRAMUSCULAR | Status: DC | PRN

## 2021-08-01 MED ORDER — SODIUM CHLORIDE (PF) 0.9 % IJ SOLN
INTRAMUSCULAR | Status: AC
  Filled 2021-08-01: qty 50

## 2021-08-01 MED ORDER — FENTANYL CITRATE (PF) 100 MCG/2ML IJ SOLN
INTRAMUSCULAR | Status: DC | PRN
  Administered 2021-08-01 (×2): 25 ug via INTRAVENOUS

## 2021-08-01 MED ORDER — PROPOFOL 500 MG/50ML IV EMUL
INTRAVENOUS | Status: AC
  Filled 2021-08-01: qty 50

## 2021-08-01 MED ORDER — CHLORHEXIDINE GLUCONATE 0.12 % MT SOLN
OROMUCOSAL | Status: AC
  Filled 2021-08-01: qty 15

## 2021-08-01 MED ORDER — KETOROLAC TROMETHAMINE 30 MG/ML IJ SOLN
INTRAMUSCULAR | Status: DC | PRN
  Administered 2021-08-01: 15 mg via INTRAVENOUS

## 2021-08-01 MED ORDER — SODIUM CHLORIDE 0.9 % IV SOLN
INTRAVENOUS | Status: DC | PRN
  Administered 2021-08-01: 9 mL via INTRAMUSCULAR

## 2021-08-01 MED ORDER — ACETAMINOPHEN 10 MG/ML IV SOLN
INTRAVENOUS | Status: AC
  Filled 2021-08-01: qty 100

## 2021-08-01 MED ORDER — ACETAMINOPHEN 10 MG/ML IV SOLN
INTRAVENOUS | Status: DC | PRN
  Administered 2021-08-01: 1000 mg via INTRAVENOUS

## 2021-08-01 MED ORDER — ONDANSETRON HCL 4 MG/2ML IJ SOLN: 4.0000 mg | Freq: Once | INTRAMUSCULAR | Status: DC | PRN

## 2021-08-01 MED ORDER — MIDAZOLAM HCL 2 MG/2ML IJ SOLN
INTRAMUSCULAR | Status: DC | PRN
Start: 2021-08-01 — End: 2021-08-01
  Administered 2021-08-01 (×2): 1 mg via INTRAVENOUS

## 2021-08-01 MED ORDER — CEFAZOLIN SODIUM-DEXTROSE 2-4 GM/100ML-% IV SOLN
INTRAVENOUS | Status: AC
  Filled 2021-08-01: qty 100

## 2021-08-01 MED ORDER — MIDAZOLAM HCL 2 MG/2ML IJ SOLN
INTRAMUSCULAR | Status: AC
  Filled 2021-08-01: qty 2

## 2021-08-01 MED ORDER — LACTATED RINGERS IV SOLN: INTRAVENOUS | Status: DC | PRN

## 2021-08-01 MED ORDER — PROPOFOL 10 MG/ML IV BOLUS
INTRAVENOUS | Status: DC | PRN
  Administered 2021-08-01: 50 mg via INTRAVENOUS

## 2021-08-01 MED ORDER — PROPOFOL 500 MG/50ML IV EMUL
INTRAVENOUS | Status: DC | PRN
  Administered 2021-08-01: 100 ug/kg/min via INTRAVENOUS

## 2021-08-01 MED ORDER — ONDANSETRON HCL 4 MG/2ML IJ SOLN
INTRAMUSCULAR | Status: DC | PRN
  Administered 2021-08-01: 4 mg via INTRAVENOUS

## 2021-08-01 MED ORDER — FAMOTIDINE 20 MG PO TABS
20.0000 mg | ORAL_TABLET | Freq: Once | ORAL | Status: AC
  Administered 2021-08-01: 20 mg via ORAL

## 2021-08-01 MED ORDER — LIDOCAINE HCL (PF) 2 % IJ SOLN
INTRAMUSCULAR | Status: AC
  Filled 2021-08-01: qty 5

## 2021-08-01 MED ORDER — BUPIVACAINE-EPINEPHRINE (PF) 0.25% -1:200000 IJ SOLN
INTRAMUSCULAR | Status: AC
  Filled 2021-08-01: qty 30

## 2021-08-01 MED ORDER — CEFAZOLIN SODIUM-DEXTROSE 2-4 GM/100ML-% IV SOLN
2.0000 g | INTRAVENOUS | Status: AC
  Administered 2021-08-01: 2 g via INTRAVENOUS

## 2021-08-01 MED ORDER — FENTANYL CITRATE (PF) 100 MCG/2ML IJ SOLN
INTRAMUSCULAR | Status: AC
  Filled 2021-08-01: qty 2

## 2021-08-01 MED ORDER — HEPARIN SODIUM (PORCINE) 5000 UNIT/ML IJ SOLN
INTRAMUSCULAR | Status: AC
  Filled 2021-08-01: qty 1

## 2021-08-01 MED ORDER — BUPIVACAINE-EPINEPHRINE (PF) 0.25% -1:200000 IJ SOLN
INTRAMUSCULAR | Status: DC | PRN
  Administered 2021-08-01: 9 mL via PERINEURAL

## 2021-08-01 MED ORDER — PHENYLEPHRINE HCL (PRESSORS) 10 MG/ML IV SOLN
INTRAVENOUS | Status: DC | PRN
  Administered 2021-08-01: 80 ug via INTRAVENOUS

## 2021-08-01 MED ORDER — PROPOFOL 10 MG/ML IV BOLUS
INTRAVENOUS | Status: AC
  Filled 2021-08-01: qty 20

## 2021-08-01 SURGICAL SUPPLY — 36 items
ADH SKN CLS APL DERMABOND .7 (GAUZE/BANDAGES/DRESSINGS) ×1
APL PRP STRL LF DISP 70% ISPRP (MISCELLANEOUS) ×1
BAG DECANTER FOR FLEXI CONT (MISCELLANEOUS) ×2 IMPLANT
BLADE SURG 11 STRL SS SAFETY (MISCELLANEOUS) ×2 IMPLANT
BLADE SURG SZ11 CARB STEEL (BLADE) ×2 IMPLANT
BOOT SUTURE AID YELLOW STND (SUTURE) ×2 IMPLANT
CHLORAPREP W/TINT 26 (MISCELLANEOUS) ×2 IMPLANT
COVER LIGHT HANDLE STERIS (MISCELLANEOUS) ×4 IMPLANT
DERMABOND ADVANCED (GAUZE/BANDAGES/DRESSINGS) ×1
DERMABOND ADVANCED .7 DNX12 (GAUZE/BANDAGES/DRESSINGS) ×1 IMPLANT
DRAPE C-ARM XRAY 36X54 (DRAPES) ×2 IMPLANT
ELECT REM PT RETURN 9FT ADLT (ELECTROSURGICAL) ×2
ELECTRODE REM PT RTRN 9FT ADLT (ELECTROSURGICAL) ×1 IMPLANT
GAUZE 4X4 16PLY ~~LOC~~+RFID DBL (SPONGE) ×2 IMPLANT
GLOVE SURG ENC MOIS LTX SZ6.5 (GLOVE) ×2 IMPLANT
GLOVE SURG UNDER POLY LF SZ6.5 (GLOVE) ×2 IMPLANT
GOWN STRL REUS W/ TWL LRG LVL3 (GOWN DISPOSABLE) ×3 IMPLANT
GOWN STRL REUS W/TWL LRG LVL3 (GOWN DISPOSABLE) ×6
IV NS 500ML (IV SOLUTION) ×2
IV NS 500ML BAXH (IV SOLUTION) ×1 IMPLANT
KIT PORT POWER 8FR ISP CVUE (Port) ×2 IMPLANT
KIT TURNOVER KIT A (KITS) ×2 IMPLANT
LABEL OR SOLS (LABEL) ×2 IMPLANT
MANIFOLD NEPTUNE II (INSTRUMENTS) ×2 IMPLANT
NEEDLE FILTER BLUNT 18X 1/2SAF (NEEDLE) ×1
NEEDLE FILTER BLUNT 18X1 1/2 (NEEDLE) ×1 IMPLANT
PACK PORT-A-CATH (MISCELLANEOUS) ×2 IMPLANT
SUT MNCRL AB 4-0 PS2 18 (SUTURE) ×2 IMPLANT
SUT PROLENE 2 0 FS (SUTURE) ×2 IMPLANT
SUT VIC AB 2-0 SH 27 (SUTURE) ×2
SUT VIC AB 2-0 SH 27XBRD (SUTURE) ×1 IMPLANT
SUT VIC AB 3-0 SH 27 (SUTURE) ×2
SUT VIC AB 3-0 SH 27X BRD (SUTURE) ×1 IMPLANT
SYR 10ML LL (SYRINGE) ×2 IMPLANT
SYR 3ML LL SCALE MARK (SYRINGE) ×2 IMPLANT
WATER STERILE IRR 500ML POUR (IV SOLUTION) ×2 IMPLANT

## 2021-08-01 NOTE — Transfer of Care (Signed)
Immediate Anesthesia Transfer of Care Note  Patient: Amber Blevins  Procedure(s) Performed: INSERTION PORT-A-CATH (Chest)  Patient Location: PACU  Anesthesia Type:General  Level of Consciousness: awake, alert , oriented and patient cooperative  Airway & Oxygen Therapy: Patient Spontanous Breathing and Patient connected to nasal cannula oxygen  Post-op Assessment: Report given to RN and Post -op Vital signs reviewed and stable  Post vital signs: Reviewed and stable  Last Vitals:  Vitals Value Taken Time  BP    Temp    Pulse    Resp    SpO2      Last Pain:  Vitals:   08/01/21 1227  TempSrc: Temporal  PainSc: 1          Complications: No notable events documented.

## 2021-08-01 NOTE — Anesthesia Preprocedure Evaluation (Signed)
Anesthesia Evaluation  Patient identified by MRN, date of birth, ID band Patient awake    Reviewed: Allergy & Precautions, NPO status , Patient's Chart, lab work & pertinent test results  History of Anesthesia Complications Negative for: history of anesthetic complications  Airway Mallampati: II  TM Distance: >3 FB Neck ROM: Full    Dental no notable dental hx.    Pulmonary neg sleep apnea, COPD,  oxygen dependent, former smoker,    breath sounds clear to auscultation- rhonchi (-) wheezing      Cardiovascular hypertension, Pt. on medications (-) CAD, (-) Past MI, (-) Cardiac Stents and (-) CABG  Rhythm:Regular Rate:Normal - Systolic murmurs and - Diastolic murmurs    Neuro/Psych neg Seizures negative neurological ROS  negative psych ROS   GI/Hepatic negative GI ROS, Neg liver ROS,   Endo/Other  negative endocrine ROSneg diabetes  Renal/GU negative Renal ROS     Musculoskeletal negative musculoskeletal ROS (+)   Abdominal (+) + obese,   Peds  Hematology negative hematology ROS (+)   Anesthesia Other Findings Past Medical History: No date: Breast cancer (HCC) No date: COPD (chronic obstructive pulmonary disease) (HCC) No date: Family history of adverse reaction to anesthesia     Comment:  brother has problem with coming out of anesthesia No date: High cholesterol No date: Hypertension No date: Personal history of radiation therapy No date: Pre-diabetes   Reproductive/Obstetrics                             Anesthesia Physical Anesthesia Plan  ASA: 3  Anesthesia Plan: General   Post-op Pain Management:    Induction: Intravenous  PONV Risk Score and Plan: 2 and Propofol infusion  Airway Management Planned: Natural Airway  Additional Equipment:   Intra-op Plan:   Post-operative Plan:   Informed Consent: I have reviewed the patients History and Physical, chart, labs and  discussed the procedure including the risks, benefits and alternatives for the proposed anesthesia with the patient or authorized representative who has indicated his/her understanding and acceptance.     Dental advisory given  Plan Discussed with: CRNA and Anesthesiologist  Anesthesia Plan Comments:         Anesthesia Quick Evaluation

## 2021-08-01 NOTE — Interval H&P Note (Signed)
History and Physical Interval Note:  08/01/2021 4:57 PM  Amber Blevins  has presented today for surgery, with the diagnosis of C50.919 Metastatic breast cancer.  The various methods of treatment have been discussed with the patient and family. After consideration of risks, benefits and other options for treatment, the patient has consented to  Procedure(s): INSERTION PORT-A-CATH (N/A) as a surgical intervention.  The patient's history has been reviewed, patient examined, no change in status, stable for surgery.  I have reviewed the patient's chart and labs.  Questions were answered to the patient's satisfaction.     Herbert Pun

## 2021-08-01 NOTE — Discharge Instructions (Addendum)
  Diet: Resume home heart healthy regular diet.   Activity: Increase activity as tolerated. Light activity and walking are encouraged. Do not drive or drink alcohol if taking narcotic pain medications.  Wound care: May shower with soapy water and pat dry (do not rub incisions), but no baths or submerging incision underwater until follow-up. (no swimming)   Medications: Resume all home medications. For mild to moderate pain: acetaminophen (Tylenol) or ibuprofen (if no kidney disease). Combining Tylenol with alcohol can substantially increase your risk of causing liver disease. Narcotic pain medications, if prescribed, can be used for severe pain, though may cause nausea, constipation, and drowsiness. Do not combine Tylenol and Norco within a 6 hour period as Norco contains Tylenol. If you do not need the narcotic pain medication, you do not need to fill the prescription.  Call office ([phone]) at any time if any questions, worsening pain, fevers/chills, bleeding, drainage from incision site, or other concerns.   AMBULATORY SURGERY  DISCHARGE INSTRUCTIONS   The drugs that you were given will stay in your system until tomorrow so for the next 24 hours you should not:  Drive an automobile Make any legal decisions Drink any alcoholic beverage   You may resume regular meals tomorrow.  Today it is better to start with liquids and gradually work up to solid foods.  You may eat anything you prefer, but it is better to start with liquids, then soup and crackers, and gradually work up to solid foods.   Please notify your doctor immediately if you have any unusual bleeding, trouble breathing, redness and pain at the surgery site, drainage, fever, or pain not relieved by medication.    Additional Instructions:        Please contact your physician with any problems or Same Day Surgery at [phone], Monday through Friday 6 am to 4 pm, or [facility] Main number at  [phone]. 

## 2021-08-01 NOTE — Op Note (Signed)
SURGICAL PROCEDURE REPORT  DATE OF PROCEDURE: 08/01/2021   SURGEON: Dr. Windell Moment   ANESTHESIA: Local with light IV sedation   PRE-OPERATIVE DIAGNOSIS: Metastatic breast cancer requiring durable central venous access for chemotherapy   POST-OPERATIVE DIAGNOSIS: Same  PROCEDURE(S):  1.) Percutaneous access of Right internal jugular vein under ultrasound guidance 2.) Insertion of tunneled Right internal jugular central venous catheter with subcutaneous port  INTRAOPERATIVE FINDINGS: Patent easily compressible Right internal jugular vein with appropriate respiratory variations and well-secured tunneled central venous catheter with subcutaneous port at completion of the procedure  ESTIMATED BLOOD LOSS: Minimal (<20 mL)   SPECIMENS: None   IMPLANTS: 72F tunneled Bard PowerPort central venous catheter with subcutaneous port  DRAINS: None   COMPLICATIONS: None apparent   CONDITION AT COMPLETION: Hemodynamically stable, awake   DISPOSITION: PACU   INDICATION(S) FOR PROCEDURE:  Patient is a 70 y.o. female who presented with metastatic breast cancer requiring durable central venous access for chemotherapy. All risks, benefits, and alternatives to above elective procedures were discussed with the patient, who elected to proceed, and informed consent was accordingly obtained at that time.  DETAILS OF PROCEDURE:  Patient was brought to the operative suite and appropriately identified. In Trendelenburg position, Right IJ venous access site was prepped and draped in the usual sterile fashion, and following a brief timeout, percutaneous Right IJ venous access was obtained under ultrasound guidance using Seldinger technique, by which local anesthetic was injected over the Right IJ vein, and access needle was inserted under direct ultrasound visualization into the Right IJ vein, through which soft guidewire was advanced, over which access needle was withdrawn. Guidewire was secured, attention  was directed to injection of local anesthetic along the planned tunnel site, 2-3 cm transverse Right chest incision was made and confirmed to accommodate the subcutaneous port, and flushed catheter was tunneled retrograde from the port site over the Right chest to the Right IJ access site with the attached port well-secured to the catheter and within the subcutaneous pocket. Insertion sheath was advanced over the guidewire, which was withdrawn along with the insertion sheath dilator. The catheter was introduced through the sheath and left on the [place] junction under fluoro guidance and catheter cut to desire lenght. Catheter connected to port and fixed to the pocket on two side to avoid twisting. Port was confirmed to withdraw blood and flush easily, after which concentrated heparin was instilled into the port and catheter. Dermis at the subcutaneous pocket was re-approximated using buried interrupted 3-0 Vicryl suture, and 4-0 Monocryl suture was used to re-approximate skin at the insertion and subcutaneous port sites in running subcuticular fashion for the subcutaneous port and buried interrupted fashion for the insertion site. Skin was cleaned, dried, and sterile skin glue was applied. Patient was then safely transferred to PACU for a chest x-ray. Ultrasound images are available on paper chart and Fluoroscopy guidance images are available in Epic.

## 2021-08-01 NOTE — Anesthesia Postprocedure Evaluation (Signed)
Anesthesia Post Note  Patient: Amber Blevins  Procedure(s) Performed: INSERTION PORT-A-CATH (Chest)  Patient location during evaluation: PACU Anesthesia Type: General Level of consciousness: awake and alert Pain management: pain level controlled Vital Signs Assessment: post-procedure vital signs reviewed and stable Respiratory status: spontaneous breathing, nonlabored ventilation, respiratory function stable and patient connected to nasal cannula oxygen Cardiovascular status: blood pressure returned to baseline and stable Postop Assessment: no apparent nausea or vomiting Anesthetic complications: no   No notable events documented.   Last Vitals:  Vitals:   08/01/21 1715 08/01/21 1736  BP: 136/65 (!) 132/59  Pulse: 87 92  Resp: 20 16  Temp:  (!) 36.1 C  SpO2: 96% 97%    Last Pain:  Vitals:   08/01/21 1736  TempSrc: Temporal  PainSc: 0-No pain                 Martha Clan

## 2021-08-04 ENCOUNTER — Inpatient Hospital Stay: Payer: Medicare PPO

## 2021-08-04 ENCOUNTER — Encounter: Payer: Self-pay | Admitting: Oncology

## 2021-08-04 ENCOUNTER — Inpatient Hospital Stay (HOSPITAL_BASED_OUTPATIENT_CLINIC_OR_DEPARTMENT_OTHER): Payer: Medicare PPO | Admitting: Oncology

## 2021-08-04 ENCOUNTER — Ambulatory Visit
Admission: RE | Admit: 2021-08-04 | Discharge: 2021-08-04 | Disposition: A | Discharge status: Home / Self Care | Payer: Medicare PPO | Source: Ambulatory Visit | Attending: Radiation Oncology | Admitting: Radiation Oncology

## 2021-08-04 ENCOUNTER — Other Ambulatory Visit: Payer: Self-pay

## 2021-08-04 VITALS — BP 131/77 | HR 68 | Temp 97.0°F | Resp 18

## 2021-08-04 VITALS — BP 130/75 | HR 63 | Temp 97.3°F | Wt 174.0 lb

## 2021-08-04 DIAGNOSIS — C50919 Malignant neoplasm of unspecified site of unspecified female breast: Secondary | ICD-10-CM | POA: Diagnosis not present

## 2021-08-04 DIAGNOSIS — C7951 Secondary malignant neoplasm of bone: Secondary | ICD-10-CM

## 2021-08-04 DIAGNOSIS — J90 Pleural effusion, not elsewhere classified: Secondary | ICD-10-CM

## 2021-08-04 DIAGNOSIS — J9601 Acute respiratory failure with hypoxia: Secondary | ICD-10-CM

## 2021-08-04 DIAGNOSIS — G893 Neoplasm related pain (acute) (chronic): Secondary | ICD-10-CM | POA: Diagnosis not present

## 2021-08-04 DIAGNOSIS — C801 Malignant (primary) neoplasm, unspecified: Secondary | ICD-10-CM

## 2021-08-04 DIAGNOSIS — Z5111 Encounter for antineoplastic chemotherapy: Secondary | ICD-10-CM | POA: Diagnosis not present

## 2021-08-04 LAB — COMPREHENSIVE METABOLIC PANEL
ALT: 21 U/L (ref 0–44)
AST: 24 U/L (ref 15–41)
Albumin: 3.5 g/dL (ref 3.5–5.0)
Alkaline Phosphatase: 96 U/L (ref 38–126)
Anion gap: 8 (ref 5–15)
BUN: 23 mg/dL (ref 8–23)
CO2: 30 mmol/L (ref 22–32)
Calcium: 8.4 mg/dL — ABNORMAL LOW (ref 8.9–10.3)
Chloride: 93 mmol/L — ABNORMAL LOW (ref 98–111)
Creatinine, Ser: 0.65 mg/dL (ref 0.44–1.00)
GFR, Estimated: >60 mL/min (ref >60)
Glucose, Bld: 107 mg/dL — ABNORMAL HIGH (ref 70–99)
Potassium: 3.3 mmol/L — ABNORMAL LOW (ref 3.5–5.1)
Sodium: 131 mmol/L — ABNORMAL LOW (ref 135–145)
Total Bilirubin: 1.1 mg/dL (ref 0.3–1.2)
Total Protein: 6.2 g/dL — ABNORMAL LOW (ref 6.5–8.1)

## 2021-08-04 LAB — CBC WITH DIFFERENTIAL/PLATELET
Abs Immature Granulocytes: 0.19 K/uL — ABNORMAL HIGH (ref 0.00–0.07)
Basophils Absolute: 0 K/uL (ref 0.0–0.1)
Basophils Relative: 1 %
Eosinophils Absolute: 0.1 K/uL (ref 0.0–0.5)
Eosinophils Relative: 2 %
HCT: 40.1 % (ref 36.0–46.0)
Hemoglobin: 13.2 g/dL (ref 12.0–15.0)
Immature Granulocytes: 3 %
Lymphocytes Relative: 21 %
Lymphs Abs: 1.4 K/uL (ref 0.7–4.0)
MCH: 28.5 pg (ref 26.0–34.0)
MCHC: 32.9 g/dL (ref 30.0–36.0)
MCV: 86.6 fL (ref 80.0–100.0)
Monocytes Absolute: 0.6 K/uL (ref 0.1–1.0)
Monocytes Relative: 9 %
Neutro Abs: 4.2 K/uL (ref 1.7–7.7)
Neutrophils Relative %: 64 %
Platelets: 337 K/uL (ref 150–400)
RBC: 4.63 MIL/uL (ref 3.87–5.11)
RDW: 14.9 % (ref 11.5–15.5)
WBC: 6.5 K/uL (ref 4.0–10.5)
nRBC: 0.5 % — ABNORMAL HIGH (ref 0.0–0.2)

## 2021-08-04 MED ORDER — SODIUM CHLORIDE 0.9 % IV SOLN
80.0000 mg/m2 | Freq: Once | INTRAVENOUS | Status: AC
  Administered 2021-08-04: 156 mg via INTRAVENOUS
  Filled 2021-08-04: qty 26

## 2021-08-04 MED ORDER — HEPARIN SOD (PORK) LOCK FLUSH 100 UNIT/ML IV SOLN
500.0000 [IU] | Freq: Once | INTRAVENOUS | Status: AC | PRN
  Administered 2021-08-04: 500 [IU]
  Filled 2021-08-04: qty 5

## 2021-08-04 MED ORDER — FAMOTIDINE 20 MG IN NS 100 ML IVPB
20.0000 mg | Freq: Once | INTRAVENOUS | Status: AC
  Administered 2021-08-04: 20 mg via INTRAVENOUS
  Filled 2021-08-04: qty 20

## 2021-08-04 MED ORDER — DIPHENHYDRAMINE HCL 50 MG/ML IJ SOLN
50.0000 mg | Freq: Once | INTRAMUSCULAR | Status: AC
  Administered 2021-08-04: 50 mg via INTRAVENOUS
  Filled 2021-08-04: qty 1

## 2021-08-04 MED ORDER — SODIUM CHLORIDE 0.9 % IV SOLN
10.0000 mg | Freq: Once | INTRAVENOUS | Status: AC
  Administered 2021-08-04: 10 mg via INTRAVENOUS
  Filled 2021-08-04: qty 10
  Filled 2021-08-04: qty 1

## 2021-08-04 MED ORDER — SODIUM CHLORIDE 0.9 % IV SOLN
Freq: Once | INTRAVENOUS | Status: AC
  Filled 2021-08-04: qty 250

## 2021-08-04 MED ORDER — POTASSIUM CHLORIDE CRYS ER 20 MEQ PO TBCR: 20.0000 meq | EXTENDED_RELEASE_TABLET | Freq: Every day | ORAL | 0 refills | Status: DC

## 2021-08-04 NOTE — Patient Instructions (Signed)
[facility]  Discharge Instructions: Thank you for choosing [facility] to provide your oncology and hematology care.  If you have a lab appointment with the Cancer Center, please go directly to the Cancer Center and check in at the registration area.  Wear comfortable clothing and clothing appropriate for easy access to any Portacath or PICC line.   We strive to give you quality time with your provider. You may need to reschedule your appointment if you arrive late (15 or more minutes).  Arriving late affects you and other patients whose appointments are after yours.  Also, if you miss three or more appointments without notifying the office, you may be dismissed from the clinic at the provider's discretion.      For prescription refill requests, have your pharmacy contact our office and allow 72 hours for refills to be completed.    Today you received the following chemotherapy and/or immunotherapy agents       To help prevent nausea and vomiting after your treatment, we encourage you to take your nausea medication as directed.  BELOW ARE SYMPTOMS THAT SHOULD BE REPORTED IMMEDIATELY: *FEVER GREATER THAN 100.4 F (38 C) OR HIGHER *CHILLS OR SWEATING *NAUSEA AND VOMITING THAT IS NOT CONTROLLED WITH YOUR NAUSEA MEDICATION *UNUSUAL SHORTNESS OF BREATH *UNUSUAL BRUISING OR BLEEDING *URINARY PROBLEMS (pain or burning when urinating, or frequent urination) *BOWEL PROBLEMS (unusual diarrhea, constipation, pain near the anus) TENDERNESS IN MOUTH AND THROAT WITH OR WITHOUT PRESENCE OF ULCERS (sore throat, sores in mouth, or a toothache) UNUSUAL RASH, SWELLING OR PAIN  UNUSUAL VAGINAL DISCHARGE OR ITCHING   Items with * indicate a potential emergency and should be followed up as soon as possible or go to the Emergency Department if any problems should occur.  Please show the CHEMOTHERAPY ALERT CARD or IMMUNOTHERAPY ALERT CARD at check-in to the  Emergency Department and triage nurse.  Should you have questions after your visit or need to cancel or reschedule your appointment, please contact [facility]  [phone] and follow the prompts.  Office hours are 8:00 a.m. to 4:30 p.m. Monday - Friday. Please note that voicemails left after 4:00 p.m. may not be returned until the following business day.  We are closed weekends and major holidays. You have access to a nurse at all times for urgent questions. Please call the main number to the clinic [phone] and follow the prompts.  For any non-urgent questions, you may also contact your provider using MyChart. We now offer e-Visits for anyone 18 and older to request care online for non-urgent symptoms. For details visit [email or web address].   Also download the MyChart app! Go to the app store, search "MyChart", open the app, select [facility], and log in with your MyChart username and password.  Due to Covid, a mask is required upon entering the hospital/clinic. If you do not have a mask, one will be given to you upon arrival. For doctor visits, patients may have 1 support person aged 18 or older with them. For treatment visits, patients cannot have anyone with them due to current Covid guidelines and our immunocompromised population.  

## 2021-08-04 NOTE — Progress Notes (Signed)
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  female with PMH listed below was seen in consultation at the request of  Gayland Curry, MD  for evaluation of tumor in lumbar vertebrae  # patient reports feeling tightness and pain in Feb 2022. She was started on conservative management which did not help her symptoms. She also started to experience numbness.  She takes gabapentin which partially relieve the discomfort. She denies bowel or bladder incontinence, no lower extremity weakness.   06/25/2021 MRI lumbar spine IMPRESSION:  1. 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 Additional metastatic involvement T12, L1 through L3.  No primary tumor site identified. Top differential considerations are Metastatic Disease Unknown primary, less likely Lymphoma or Multiple Myeloma.   2. Superimposed lumbar spine degeneration, including degenerative moderate to severe left L3 and L4 nerve level impingement from disc herniation.    Patient was seen by neurosurgeon Dr.Yarbrough today. He has ordered MRI cervical and thoracic spine and sacrum biopsy for further work up.   Patient has a history of breast cancer, diagnosed in 2010 Left-sided T2 (2.4cm) N0, ER/PR positive, HER2 negative IDC of the breast, s/p lumpectomy by Dr[facility]Meyer at [facility]. Oncotype  score of 11 who completed radiation and she finished 10 years of Femara from 05/2009 and stopped in 2020.  she has intentionally lost some weight, no night sweating, fever.   # 07/02/2021, CT chest abdomen pelvis showed innumerable small pulmonary and pleural nodules consistent with diffuse metastasis.  Associated with probable malignant pleural effusion.  Mediastinal and hilar lymphadenopathy consistent with metastatic disease.  Hepatic and bilateral adrenal gland metastasis.  Diffuse extensive destructive metastatic bone disease involving pelvis.   07/11/2021 - 07/14/2021  Patient was admitted to hospital due to shortness of breath. 07/12/2021, CT chest PE protocol showed no PE.  Further increase of March pleural effusion and admit to moderate interstitial pulmonary edema.  Bulky mediastinal and hilar lymphadenopathy.  Unchanged.  Incompletely visualized left adrenal nodule. 07/12/2021 patient underwent right thoracentesis.  Cytology was positive for metastatic carcinoma, compatible with breast origin.  ER/PR/HER2 status is pending.  07/16/2021, patient underwent iliac bone biopsy.  Positive for metastatic carcinoma. 07/25/2021, patient underwent thoracentesis of right side, removed 550 cc straw-colored pleural fluid.  She tolerated procedure well. Today she reports shortness of breath with exertion has slightly improved.  She uses home oxygen 2 L  INTERVAL HISTORY Amber Blevins is a 70 y.o. female who has above history reviewed by me today presents for follow up visit for management of metastatic breast cancer . Shortness of breath has improved.  She is currently off oxygen. Lower back/sacrum pain is improved.  She is on oxycodone every 12 hours as needed. She denies any diarrhea, loss control of bowel movements or urination . Review of Systems  Constitutional:  Negative for appetite change, chills, fatigue and fever.  HENT:   Negative for hearing loss and voice change.   Eyes:  Negative for eye problems.  Respiratory:  Negative for chest tightness and cough.   Cardiovascular:  Negative for chest pain.  Gastrointestinal:  Negative for abdominal distention, abdominal pain and blood in stool.  Endocrine: Negative for hot flashes.  Genitourinary:  Negative for difficulty urinating and frequency.   Musculoskeletal:  Positive for back pain. Negative for arthralgias.       Lower back/sacrum pain and numbness  Skin:  Negative for itching and rash.  Neurological:  Negative for extremity weakness.  Hematological:  Negative for adenopathy.  Psychiatric/Behavioral:  Negative for confusion.    MEDICAL HISTORY:  Past Medical History:  Diagnosis Date   Breast cancer ([place])    COPD (chronic obstructive pulmonary disease) ([place])    Family history of adverse reaction to anesthesia    brother has problem with coming out of anesthesia   High cholesterol    Hypertension    Personal history of radiation therapy    Pre-diabetes     SURGICAL HISTORY: Past Surgical History:  Procedure Laterality Date   BREAST LUMPECTOMY Left    2010   COLONOSCOPY     EYE SURGERY Left    Cataract surgery   thorocentesis Right 07/10/2021   and another on 07/25/21    SOCIAL HISTORY: Social History   Socioeconomic History   Marital status: Married    Spouse name: Not on file   Number of children: Not on file   Years of education: Not on file   Highest education level: Not on file  Occupational History   Not on file  Tobacco Use   Smoking status: Former    Packs/day: 1.00    Years: 25.00    Pack years: 25.00    Types: Cigarettes    Quit date: 4    Years since quitting: 32.8   Smokeless tobacco: Never  Vaping Use   Vaping Use: Never used  Substance and Sexual Activity   Alcohol use: Not Currently   Drug use: Never   Sexual activity: Not on file  Other Topics Concern   Not on file  Social History Narrative   Not on file   Social Determinants of Health   Financial  Resource Strain: Not on file  Food Insecurity: Not on file  Transportation Needs: Not on file  Physical Activity: Not on file  Stress: Not on file  Social Connections: Not on file  Intimate Partner Violence: Not on file    FAMILY HISTORY: Family History  Problem Relation Age of Onset   Cancer Mother        gynecological   Lung cancer Mother    Diabetes Father    Heart disease Father    Parkinson's disease Father    Brain cancer Brother    Bladder Cancer Brother    Pulmonary disease Brother    Rheumatic fever Brother    Breast cancer Neg Hx     ALLERGIES:  is allergic to green tea (camellia sinensis), melaleuca viridiflora, and lisinopril.  MEDICATIONS:  Current Outpatient Medications  Medication Sig Dispense Refill   acetaminophen (TYLENOL) 650 MG CR tablet Take 650 mg by mouth every 8 (eight) hours as needed for pain.     albuterol (VENTOLIN HFA) 108 (90 Base) MCG/ACT inhaler Inhale 2 puffs into the lungs every 6 (six) hours as needed.     aspirin 81 MG EC tablet Take by mouth.     atorvastatin (LIPITOR) 40 MG tablet Take 40 mg by mouth at bedtime.  Boswellia-Glucosamine-Vit D (OSTEO BI-FLEX-GLUCOS/5-LOXIN) TABS Take 1 capsule by mouth in the morning and at bedtime.     Calcium Carb-Cholecalciferol 600-400 MG-UNIT TABS Take 1 tablet by mouth daily.     dexamethasone (DECADRON) 1 MG tablet Take 2 tablets (2 mg total) by mouth daily. 14 tablet 0   dexamethasone (DECADRON) 4 MG tablet Take 1 tablet (4 mg total) by mouth daily. (Patient not taking: Reported on 07/29/2021) 20 tablet 0   Docusate Sodium (DSS) 100 MG CAPS Take 1 capsule by mouth every other day.     Fluticasone-Umeclidin-Vilant (TRELEGY ELLIPTA) 100-62.5-25 MCG/INH AEPB Inhale into the lungs. Inhale 1 inhalation into the lungs once daily     gabapentin (NEURONTIN) 300 MG capsule Take 2 capsules by mouth in the morning, at noon, and at bedtime.     ibuprofen (ADVIL) 200 MG tablet Take by mouth. Take 400 mg by  mouth every six (6) hours as needed for pain. (Patient not taking: No sig reported)     lidocaine-prilocaine (EMLA) cream Apply 1 application topically as needed. 30 g 5   Loperamide HCl (IMODIUM PO) Take by mouth.     LORazepam (ATIVAN) 0.5 MG tablet Take 1 tablet (0.5 mg total) by mouth at bedtime as needed for anxiety. (Patient not taking: Reported on 08/01/2021) 30 tablet 0   magnesium chloride (SLOW-MAG) 64 MG TBEC SR tablet Take 1 tablet by mouth at bedtime.     metoprolol-hydrochlorothiazide (LOPRESSOR HCT) 50-25 MG tablet Take 1 tablet by mouth daily. Take 1 tablet by mouth once daily     ondansetron (ZOFRAN) 8 MG tablet Take 1 tablet (8 mg total) by mouth 2 (two) times daily as needed (Nausea or vomiting). (Patient not taking: Reported on 08/01/2021) 30 tablet 1   oxyCODONE (OXY IR/ROXICODONE) 5 MG immediate release tablet Take 1 tablet (5 mg total) by mouth every 6 (six) hours as needed for severe pain. 30 tablet 0   prochlorperazine (COMPAZINE) 10 MG tablet Take 1 tablet (10 mg total) by mouth every 6 (six) hours as needed (Nausea or vomiting). 30 tablet 1   Turmeric (QC TUMERIC COMPLEX PO) Take 1 capsule by mouth at bedtime.     No current facility-administered medications for this visit.     PHYSICAL EXAMINATION: ECOG PERFORMANCE STATUS: 1 - Symptomatic but completely ambulatory Vitals:   08/04/21 0829  BP: 130/75  Pulse: 63  Temp: (!) 97.3 F (36.3 C)  SpO2: 96%   Filed Weights   08/04/21 0829  Weight: 174 lb (78.9 kg)    Physical Exam Constitutional:      General: She is not in acute distress. HENT:     Head: Normocephalic and atraumatic.  Eyes:     General: No scleral icterus. Cardiovascular:     Rate and Rhythm: Normal rate and regular rhythm.     Heart sounds: Normal heart sounds.  Pulmonary:     Effort: Pulmonary effort is normal. No respiratory distress.     Breath sounds: No wheezing.     Comments: Decreased breath sound bibasilar Abdominal:      General: Bowel sounds are normal. There is no distension.     Palpations: Abdomen is soft.  Musculoskeletal:        General: No deformity. Normal range of motion.     Cervical back: Normal range of motion and neck supple.  Skin:    General: Skin is warm and dry.     Findings: No erythema or rash.  Neurological:  Mental Status: She is alert and oriented to person, place, and time. Mental status is at baseline.     Cranial Nerves: No cranial nerve deficit.     Coordination: Coordination normal.  Psychiatric:        Mood and Affect: Mood normal.    LABORATORY DATA:  I have reviewed the data as listed Lab Results  Component Value Date   WBC 10.6 (H) 07/28/2021   HGB 13.7 07/28/2021   HCT 41.5 07/28/2021   MCV 87.6 07/28/2021   PLT 410 (H) 07/28/2021   Recent Labs    07/02/21 1604 07/11/21 2216 07/21/21 0805 07/28/21 0752  NA 132* 137 130* 132*  K 3.6 3.7 3.8 4.4  CL 95* 99 93* 89*  CO2 26 27 27  33*  GLUCOSE 100* 110* 182* 137*  BUN 18 18 28* 21  CREATININE 0.75 0.72 0.70 0.74  CALCIUM 9.4 9.8 9.2 8.8*  GFRNONAA >60 >60 >60 >60  PROT 8.3*  --  7.5 6.9  ALBUMIN 4.3  --  3.8 3.9  AST 28  --  32 28  ALT 18  --  20 25  ALKPHOS 107  --  96 99  BILITOT 1.2  --  0.8 1.1    Iron/TIBC/Ferritin/ %Sat No results found for: IRON, TIBC, FERRITIN, IRONPCTSAT    RADIOGRAPHIC STUDIES: I have personally reviewed the radiological images as listed and agreed with the findings in the report. DG Chest 1 View  Result Date: 07/12/2021 CLINICAL DATA:  Status post right thoracentesis EXAM: CHEST  1 VIEW COMPARISON:  07/11/2021 FINDINGS: Improvement in the right effusion following thoracentesis. Trace effusions remain bilaterally. No pneumothorax. Stable heart size. Similar diffuse interstitial opacities suggesting edema and persistent bibasilar atelectasis. Trachea midline. Aorta atherosclerotic. Degenerative changes of the spine. IMPRESSION: Negative for pneumothorax following right  thoracentesis. Stable exam. Electronically Signed   By: Jerilynn Mages.  Shick M.D.   On: 07/12/2021 13:21   DG Chest 2 View  Result Date: 07/11/2021 CLINICAL DATA:  Worsening shortness of breath. EXAM: CHEST - 2 VIEW COMPARISON:  None. FINDINGS: Mild to moderate severity diffusely increased interstitial lung markings are seen. Mild to moderate severity areas of bibasilar atelectasis and/or early infiltrate are also present. There are small bilateral pleural effusions. No pneumothorax is identified. The heart size and mediastinal contours are within normal limits. There is moderate severity calcification of the aortic arch. The visualized skeletal structures are unremarkable. IMPRESSION: 1. Mild to moderate severity interstitial edema with bibasilar atelectasis and/or early infiltrate. 2. Small bilateral pleural effusions. Electronically Signed   By: Virgina Norfolk M.D.   On: 07/11/2021 23:03   CT Angio Chest PE W/Cm &/Or Wo Cm  Result Date: 07/12/2021 CLINICAL DATA:  Shortness of breath EXAM: CT ANGIOGRAPHY CHEST WITH CONTRAST TECHNIQUE: Multidetector CT imaging of the chest was performed using the standard protocol during bolus administration of intravenous contrast. Multiplanar CT image reconstructions and MIPs were obtained to evaluate the vascular anatomy. CONTRAST:  22m OMNIPAQUE IOHEXOL 350 MG/ML SOLN COMPARISON:  None. FINDINGS: Cardiovascular: Contrast injection is sufficient to demonstrate satisfactory opacification of the pulmonary arteries to the segmental level. There is no pulmonary embolus or evidence of right heart strain. The size of the main pulmonary artery is normal. Heart size is normal, with no pericardial effusion. The course and caliber of the aorta are normal. There is atherosclerotic calcification. No acute aortic syndrome. Mediastinum/Nodes: There is mediastinal and hilar lymphadenopathy that is unchanged. Right hilar nodes cause marked narrowing of right middle lobe  arterial branches. No  axillary adenopathy is visible. Soft tissue focus in the outer left breast is unchanged. Lungs/Pleura: Large pleural effusions. Airways are patent. There is mild-to-moderate interstitial pulmonary edema. Upper Abdomen: Contrast bolus timing is not optimized for evaluation of the abdominal organs. Incompletely visualized left adrenal nodule measures at least 11 mm. Musculoskeletal: No chest wall abnormality. No bony spinal canal stenosis. Review of the MIP images confirms the above findings. IMPRESSION: 1. No pulmonary embolus or acute aortic syndrome. 2. Large pleural effusions and mild-to-moderate interstitial pulmonary edema. 3. Bulky mediastinal and hilar lymphadenopathy, unchanged. 4. Incompletely visualized left adrenal nodule. Electronically Signed   By: Ulyses Jarred M.D.   On: 07/12/2021 00:28   MR Brain W Wo Contrast  Result Date: 07/28/2021 CLINICAL DATA:  Breast cancer. EXAM: MRI HEAD WITHOUT AND WITH CONTRAST TECHNIQUE: Multiplanar, multiecho pulse sequences of the brain and surrounding structures were obtained without and with intravenous contrast. CONTRAST:  55m GADAVIST GADOBUTROL 1 MMOL/ML IV SOLN Adequate IV access could not be obtained despite assistance from the IV team. A portion of the planned contrast volume was able to be injected through a butterfly needle before the vein ruptured. COMPARISON:  None. FINDINGS: Brain: There is no evidence of an acute infarct, intracranial hemorrhage, mass, midline shift, or extra-axial fluid collection. The ventricles and sulci are normal. A few small T2 hyperintensities in the cerebral white matter and pons are nonspecific but compatible with minimal chronic small vessel ischemic disease. No enhancing brain lesions are identified, however sensitivity for detection of small lesions is reduced by the incomplete contrast injection. Vascular: Major intracranial vascular flow voids are preserved. Skull and upper cervical spine: Unremarkable bone marrow  signal. Sinuses/Orbits: Left cataract extraction. Mucous retention cyst in the left maxillary sinus. Trace left mastoid effusion. Suspected 1 cm epidermal inclusion cyst in the right cheek region. Other: None. IMPRESSION: 1. No evidence of intracranial metastases within above detailed study limitations. 2. Minimal chronic small vessel ischemic disease. Electronically Signed   By: ALogan BoresM.D.   On: 07/28/2021 17:00   MR CERVICAL SPINE W WO CONTRAST  Result Date: 07/25/2021 CLINICAL DATA:  Initial evaluation for metastatic disease. History of breast cancer. EXAM: MRI CERVICAL AND THORACIC SPINE WITHOUT AND WITH CONTRAST TECHNIQUE: Multiplanar and multiecho pulse sequences of the cervical spine, to include the craniocervical junction and cervicothoracic junction, and the thoracic spine, were obtained without and with intravenous contrast. CONTRAST:  821mGADAVIST GADOBUTROL 1 MMOL/ML IV SOLN COMPARISON:  None available. FINDINGS: MRI CERVICAL SPINE FINDINGS Alignment: Straightening of the normal cervical lordosis. No listhesis. Vertebrae: T1 hypointense, stir hyperintense enhancing lesion largely replacing the C6 vertebral body, consistent with an osseous metastatic implant. An additional separate implant seen involving the C6 spinous process (series 35, image 7). No other metastatic disease within the cervical spine itself, although note is made of additional metastatic implants involving the upper thoracic spine at the levels of T2 and T3, described on corresponding thoracic spine portion of this report. No pathologic fracture or significant extra osseous extension of tumor. Bone marrow signal intensity otherwise normal. Vertebral body height maintained. No other abnormal marrow edema or enhancement. Cord: Normal signal and morphology. No epidural or intramedullary tumor. No abnormal enhancement. Posterior Fossa, vertebral arteries, paraspinal tissues: Visualized brain and posterior fossa within normal  limits. Craniocervical junction normal. Paraspinous soft tissues within normal limits. Normal flow voids seen within the vertebral arteries bilaterally. Layering bilateral pleural effusions partially visualized. Disc levels: C2-C3: Mild uncovertebral spurring without  significant disc bulge. Mild facet hypertrophy on the right. No stenosis. C3-C4: Disc bulge with endplate and uncovertebral spurring. Bilateral facet hypertrophy. Mild to moderate spinal stenosis with moderate left worse than right C4 foraminal narrowing. C4-C5: Disc bulge with endplate and uncovertebral spurring, worse on the right. Right-sided facet hypertrophy. Mild canal with moderate right C5 foraminal stenosis. C5-C6: Mild disc bulge with uncovertebral spurring. Mild to moderate bilateral C6 foraminal narrowing, worse on the right. No spinal stenosis. C6-C7: Disc bulge with bilateral uncovertebral hypertrophy. Flattening of the ventral thecal sac with borderline mild spinal stenosis. Moderate to severe bilateral C7 foraminal narrowing, worse on the left. C7-T1:  Negative. MRI THORACIC SPINE FINDINGS Alignment: Straightening of the normal midthoracic kyphosis. No listhesis. Vertebrae: Multiple scattered osseous metastases seen throughout the thoracic spine. There is involvement of most levels, with most prominent lesions seen involving the T3, T5, T6, T7, and T10 vertebral bodies. Associated pathologic fracture at the inferior endplate of T7 with mild height loss but no bony retropulsion. Additional mild concavity about the T3 and T10 vertebral bodies also suspicious for associated compression fractures. Mild extra osseous extension about a lesion involving the spinous process of T2 (series 34, image 8). Probable early epidural extension into the right neural foramina of T2-3 (series 27, image 6) and T9-10 (series 27, image 4). No other significant extra osseous or epidural extension at this time. Cord: Normal signal and morphology. Probable early  epidural extension into the right neural foramina at T2-3 and T9-10 as above. No other significant epidural involvement at this time. No intramedullary tumor or abnormal enhancement. Paraspinal and other soft tissues: 2.1 cm soft tissue implant noted within the left posterior paraspinous soft tissues of the upper back at the level of T1-2 (series 34, image 15). Additional probable small soft tissue implant within the left posterior soft tissues at the level of T12 (series 34, image 14). Large layering bilateral pleural effusions noted. Disc levels: Underlying mild for age spondylosis. No significant disc bulge or focal disc herniation. No significant stenosis. IMPRESSION: MRI CERVICAL SPINE IMPRESSION: 1. Osseous metastatic disease involving the C6 vertebral body and C6 spinous process. No other evidence for metastatic disease within the cervical spine. No extra osseous extension or epidural involvement. 2. Underlying cervical spondylosis with resultant mild to moderate spinal stenosis at C3-4 and C4-5. Moderate bilateral C4 and right C5 foraminal narrowing, with moderate to severe bilateral C7 foraminal stenosis. MRI THORACIC SPINE IMPRESSION: 1. Diffuse osseous metastatic disease throughout the thoracic spine as detailed above. Associated pathologic compression fracture of T7, with additional probable mild compression fractures of T3 and T10. 2. Mild/early epidural extension into the right neural foramina at T2-3 and T9-10. No other significant epidural tumor at this time. 3. Extra osseous extension tumor about a lesion involving the T2 spinous process. 4. Few soft tissue implants involving the left posterior paraspinous soft tissues at the levels of T1-2 and T12 as above. 5. Large layering bilateral pleural effusions. Electronically Signed   By: Jeannine Boga M.D.   On: 07/25/2021 05:23   MR THORACIC SPINE W WO CONTRAST  Result Date: 07/25/2021 CLINICAL DATA:  Initial evaluation for metastatic  disease. History of breast cancer. EXAM: MRI CERVICAL AND THORACIC SPINE WITHOUT AND WITH CONTRAST TECHNIQUE: Multiplanar and multiecho pulse sequences of the cervical spine, to include the craniocervical junction and cervicothoracic junction, and the thoracic spine, were obtained without and with intravenous contrast. CONTRAST:  35m GADAVIST GADOBUTROL 1 MMOL/ML IV SOLN COMPARISON:  None available. FINDINGS:  MRI CERVICAL SPINE FINDINGS Alignment: Straightening of the normal cervical lordosis. No listhesis. Vertebrae: T1 hypointense, stir hyperintense enhancing lesion largely replacing the C6 vertebral body, consistent with an osseous metastatic implant. An additional separate implant seen involving the C6 spinous process (series 35, image 7). No other metastatic disease within the cervical spine itself, although note is made of additional metastatic implants involving the upper thoracic spine at the levels of T2 and T3, described on corresponding thoracic spine portion of this report. No pathologic fracture or significant extra osseous extension of tumor. Bone marrow signal intensity otherwise normal. Vertebral body height maintained. No other abnormal marrow edema or enhancement. Cord: Normal signal and morphology. No epidural or intramedullary tumor. No abnormal enhancement. Posterior Fossa, vertebral arteries, paraspinal tissues: Visualized brain and posterior fossa within normal limits. Craniocervical junction normal. Paraspinous soft tissues within normal limits. Normal flow voids seen within the vertebral arteries bilaterally. Layering bilateral pleural effusions partially visualized. Disc levels: C2-C3: Mild uncovertebral spurring without significant disc bulge. Mild facet hypertrophy on the right. No stenosis. C3-C4: Disc bulge with endplate and uncovertebral spurring. Bilateral facet hypertrophy. Mild to moderate spinal stenosis with moderate left worse than right C4 foraminal narrowing. C4-C5: Disc bulge  with endplate and uncovertebral spurring, worse on the right. Right-sided facet hypertrophy. Mild canal with moderate right C5 foraminal stenosis. C5-C6: Mild disc bulge with uncovertebral spurring. Mild to moderate bilateral C6 foraminal narrowing, worse on the right. No spinal stenosis. C6-C7: Disc bulge with bilateral uncovertebral hypertrophy. Flattening of the ventral thecal sac with borderline mild spinal stenosis. Moderate to severe bilateral C7 foraminal narrowing, worse on the left. C7-T1:  Negative. MRI THORACIC SPINE FINDINGS Alignment: Straightening of the normal midthoracic kyphosis. No listhesis. Vertebrae: Multiple scattered osseous metastases seen throughout the thoracic spine. There is involvement of most levels, with most prominent lesions seen involving the T3, T5, T6, T7, and T10 vertebral bodies. Associated pathologic fracture at the inferior endplate of T7 with mild height loss but no bony retropulsion. Additional mild concavity about the T3 and T10 vertebral bodies also suspicious for associated compression fractures. Mild extra osseous extension about a lesion involving the spinous process of T2 (series 34, image 8). Probable early epidural extension into the right neural foramina of T2-3 (series 27, image 6) and T9-10 (series 27, image 4). No other significant extra osseous or epidural extension at this time. Cord: Normal signal and morphology. Probable early epidural extension into the right neural foramina at T2-3 and T9-10 as above. No other significant epidural involvement at this time. No intramedullary tumor or abnormal enhancement. Paraspinal and other soft tissues: 2.1 cm soft tissue implant noted within the left posterior paraspinous soft tissues of the upper back at the level of T1-2 (series 34, image 15). Additional probable small soft tissue implant within the left posterior soft tissues at the level of T12 (series 34, image 14). Large layering bilateral pleural effusions noted.  Disc levels: Underlying mild for age spondylosis. No significant disc bulge or focal disc herniation. No significant stenosis. IMPRESSION: MRI CERVICAL SPINE IMPRESSION: 1. Osseous metastatic disease involving the C6 vertebral body and C6 spinous process. No other evidence for metastatic disease within the cervical spine. No extra osseous extension or epidural involvement. 2. Underlying cervical spondylosis with resultant mild to moderate spinal stenosis at C3-4 and C4-5. Moderate bilateral C4 and right C5 foraminal narrowing, with moderate to severe bilateral C7 foraminal stenosis. MRI THORACIC SPINE IMPRESSION: 1. Diffuse osseous metastatic disease throughout the thoracic spine as detailed  above. Associated pathologic compression fracture of T7, with additional probable mild compression fractures of T3 and T10. 2. Mild/early epidural extension into the right neural foramina at T2-3 and T9-10. No other significant epidural tumor at this time. 3. Extra osseous extension tumor about a lesion involving the T2 spinous process. 4. Few soft tissue implants involving the left posterior paraspinous soft tissues at the levels of T1-2 and T12 as above. 5. Large layering bilateral pleural effusions. Electronically Signed   By: Jeannine Boga M.D.   On: 07/25/2021 05:23   [place] CHEST (PLEURAL EFFUSION)  Result Date: 07/14/2021 CLINICAL DATA:  69 year old female with history of pleural effusion. EXAM: CHEST ULTRASOUND COMPARISON:  None. FINDINGS: Trace bilateral pleural effusions. IMPRESSION: Trace bilateral pleural effusions. No safe window for thoracentesis. Ruthann Cancer, MD [facility] Radiology Electronically Signed   By: Ruthann Cancer M.D.   On: 07/14/2021 15:08   DG Chest Port 1 View  Result Date: 08/01/2021 CLINICAL DATA:  Port-A-Cath placement.  Breast carcinoma. EXAM: PORTABLE CHEST 1 VIEW COMPARISON:  07/25/2021 FINDINGS: Right-sided Port-A-Cath is now seen  with tip overlying the superior cavoatrial junction. No evidence of pneumothorax. Diffuse interstitial infiltrates are again seen, without significant change. Probable small right pleural effusion is also stable. Heart size is within normal limits. Aortic atherosclerotic calcification noted. IMPRESSION: Right-sided Port-A-Cath in appropriate position. No pneumothorax visualized. Stable diffuse interstitial infiltrates and probable small right pleural effusion. Electronically Signed   By: Marlaine Hind M.D.   On: 08/01/2021 17:14   DG Chest Port 1 View  Result Date: 07/25/2021 CLINICAL DATA:  Status post right-sided thoracentesis, remote history of breast cancer EXAM: PORTABLE CHEST 1 VIEW COMPARISON:  07/12/2021 FINDINGS: Single frontal view of the chest demonstrates a stable cardiac silhouette. There are trace bilateral pleural effusions. No evidence of pneumothorax. Bronchovascular prominence with patchy bibasilar consolidation unchanged. No acute bony abnormalities. IMPRESSION: 1. Trace bilateral pleural effusions. 2. No evidence of pneumothorax. 3. Stable bronchovascular prominence and bibasilar consolidation. Electronically Signed   By: Randa Ngo M.D.   On: 07/25/2021 16:17   DG C-Arm 1-60 Min-No Report  Result Date: 08/01/2021 Fluoroscopy was utilized by the requesting physician.  No radiographic interpretation.   ECHOCARDIOGRAM COMPLETE  Result Date: 07/13/2021    ECHOCARDIOGRAM REPORT   Patient Name:   Amber Blevins Date of Exam: 07/13/2021 Medical Rec #:  [id number]          Height:       65.0 in Accession #:    [id number]         Weight:       182.1 lb Date of Birth:  1951-08-08          BSA:          1.901 m Patient Age:    23 years           BP:           151/79 mmHg Patient Gender: F                  HR:           100 bpm. Exam Location:  [facility] Procedure: 2D Echo, Cardiac Doppler and Color Doppler Indications:     SOB (shortness of breath) [[id number]]  History:         Patient has no  prior history of Echocardiogram examinations.                  Risk Factors:Hypertension.  Sonographer:     Alyse Low Roar Referring Phys:  [place] Diagnosing Phys: Ida Rogue MD IMPRESSIONS  1. Left ventricular ejection fraction, by estimation, is 60 to 65%. The left ventricle has normal function. The left ventricle has no regional wall motion abnormalities. There is mild left ventricular hypertrophy. Left ventricular diastolic parameters are consistent with Grade I diastolic dysfunction (impaired relaxation).  2. Right ventricular systolic function is normal. The right ventricular size is normal. Tricuspid regurgitation signal is inadequate for assessing PA pressure.  3. The mitral valve is normal in structure. No evidence of mitral valve regurgitation. No evidence of mitral stenosis. FINDINGS  Left Ventricle: Left ventricular ejection fraction, by estimation, is 60 to 65%. The left ventricle has normal function. The left ventricle has no regional wall motion abnormalities. The left ventricular internal cavity size was normal in size. There is  mild left ventricular hypertrophy. Left ventricular diastolic parameters are consistent with Grade I diastolic dysfunction (impaired relaxation). Right Ventricle: The right ventricular size is normal. No increase in right ventricular wall thickness. Right ventricular systolic function is normal. Tricuspid regurgitation signal is inadequate for assessing PA pressure. Left Atrium: Left atrial size was normal in size. Right Atrium: Right atrial size was normal in size. Pericardium: There is no evidence of pericardial effusion. Mitral Valve: The mitral valve is normal in structure. No evidence of mitral valve regurgitation. No evidence of mitral valve stenosis. Tricuspid Valve: The tricuspid valve is normal in structure. Tricuspid valve regurgitation is not demonstrated. No evidence of tricuspid stenosis. Aortic Valve: The aortic valve was not well visualized.  Aortic valve regurgitation is not visualized. No aortic stenosis is present. Aortic valve peak gradient measures 9.4 mmHg. Pulmonic Valve: The pulmonic valve was normal in structure. Pulmonic valve regurgitation is not visualized. No evidence of pulmonic stenosis. Aorta: The aortic root is normal in size and structure. Venous: The inferior vena cava is normal in size with greater than 50% respiratory variability, suggesting right atrial pressure of 3 mmHg. IAS/Shunts: No atrial level shunt detected by color flow Doppler.  LEFT VENTRICLE PLAX 2D LVIDd:         3.91 cm   Diastology LVIDs:         2.70 cm   LV e' medial:    4.24 cm/s LV PW:         1.16 cm   LV E/e' medial:  17.9 LV IVS:        1.31 cm   LV e' lateral:   5.87 cm/s LVOT diam:     1.80 cm   LV E/e' lateral: 12.9 LVOT Area:     2.54 cm  RIGHT VENTRICLE RV Basal diam:  2.51 cm RV Mid diam:    2.14 cm RV S prime:     12.10 cm/s TAPSE (M-mode): 2.2 cm LEFT ATRIUM             Index        RIGHT ATRIUM          Index LA diam:        3.40 cm 1.79 cm/m   RA Area:     9.27 cm LA Vol (A2C):   45.1 ml 23.73 ml/m  RA Volume:   15.90 ml 8.36 ml/m LA Vol (A4C):   33.1 ml 17.41 ml/m LA Biplane Vol: 40.3 ml 21.20 ml/m  AORTIC VALVE                 PULMONIC VALVE AV Area (  Vmax): 1.73 cm     PV Vmax:          1.02 m/s AV Vmax:        153.00 cm/s  PV Peak grad:     4.2 mmHg AV Peak Grad:   9.4 mmHg     PR End Diast Vel: 7.95 msec LVOT Vmax:      104.00 cm/s  RVOT Peak grad:   2 mmHg  AORTA Ao Root diam: 2.60 cm MITRAL VALVE MV Area (PHT): 4.41 cm     SHUNTS MV Decel Time: 172 msec     Systemic Diam: 1.80 cm MV E velocity: 75.80 cm/s MV A velocity: 105.00 cm/s MV E/A ratio:  0.72 MV A Prime:    12.7 cm/s Ida Rogue MD Electronically signed by Ida Rogue MD Signature Date/Time: 07/13/2021/1:49:22 PM    Final    CT BONE TROCAR/NEEDLE BIOPSY DEEP  Result Date: 07/16/2021 INDICATION: History of breast cancer, now with extensive osseous metastatic disease  worrisome for metastatic breast cancer. Please perform CT-guided biopsy for tissue diagnostic purposes. EXAM: CT-GUIDED BONE LESION BIOPSY MEDICATIONS: None ANESTHESIA/SEDATION: Fentanyl 100 mcg IV; Versed 2 mg IV Sedation Time: 16 Minutes; The patient was continuously monitored during the procedure by the interventional radiology nurse under my direct supervision. COMPLICATIONS: None immediate. PROCEDURE: Informed consent was obtained from the patient following an explanation of the procedure, risks, benefits and alternatives. The patient understands, agrees and consents for the procedure. All questions were addressed. A time out was performed prior to the initiation of the procedure. The patient was positioned prone and non-contrast localization CT was performed of the pelvis redemonstrated the infiltrative lesion involving the posterior aspect of the right ilium. The operative site was prepped and draped in the usual sterile fashion. Under sterile conditions and local anesthesia, a 22 gauge spinal needle was utilized for procedural planning. Next, an 11 gauge coaxial bone biopsy needle was advanced into the infiltrative lesion involving the posterior aspect the right ilium. Needle position was confirmed with CT imaging (image 8, series 3). Next, the inner 13 gauge bone biopsy device was utilized to acquire sample (representative image 10, series 3). Next, the outer 11 gauge biopsy device was utilized to acquire 4 additional samples all under intermittent CT guidance. The needle was removed and superficial hemostasis was obtained with manual compression. A dressing was applied. The patient tolerated the procedure well without immediate post procedural complication. IMPRESSION: Successful CT guided biopsy of lytic lesion involving the posterior aspect of the right ilium. Electronically Signed   By: Sandi Mariscal M.D.   On: 07/16/2021 12:50   US THORACENTESIS ASP PLEURAL SPACE W/IMG GUIDE  Result Date:  07/25/2021 INDICATION: SOB EXAM: ULTRASOUND GUIDED RIGHT THORACENTESIS MEDICATIONS: None. COMPLICATIONS: None immediate. PROCEDURE: An ultrasound guided thoracentesis was thoroughly discussed with the patient and questions answered. The benefits, risks, alternatives and complications were also discussed. The patient understands and wishes to proceed with the procedure. Written consent was obtained. Ultrasound was performed to localize and mark an adequate pocket of fluid in the right chest. The area was then prepped and draped in the normal sterile fashion. 1% Lidocaine was used for local anesthesia. Under ultrasound guidance a 19 gauge Yueh catheter was introduced. Thoracentesis was performed. The catheter was removed and a dressing applied. FINDINGS: A total of approximately 550 mL of clear straw-colored fluid was removed. IMPRESSION: Successful ultrasound guided right thoracentesis yielding 550 mL of clear straw-colored pleural fluid. Electronically Signed   By: Murrell Redden  El-Abd M.D.   On: 07/25/2021 16:57   US THORACENTESIS ASP PLEURAL SPACE W/IMG GUIDE  Result Date: 07/12/2021 INDICATION: Patient with history of metastatic breast cancer, dyspnea, bilateral pleural effusions; request received for diagnostic and therapeutic right thoracentesis. EXAM: ULTRASOUND GUIDED DIAGNOSTIC AND THERAPEUTIC RIGHT THORACENTESIS MEDICATIONS: 10 mL 1% lidocaine COMPLICATIONS: None immediate. PROCEDURE: An ultrasound guided thoracentesis was thoroughly discussed with the patient and questions answered. The benefits, risks, alternatives and complications were also discussed. The patient understands and wishes to proceed with the procedure. Written consent was obtained. Ultrasound was performed to localize and mark an adequate pocket of fluid in the right chest. The area was then prepped and draped in the normal sterile fashion. 1% Lidocaine was used for local anesthesia. Under ultrasound guidance a 6 Fr Safe-T-Centesis  catheter was introduced. Thoracentesis was performed. The catheter was removed and a dressing applied. FINDINGS: A total of approximately 750 cc of yellow fluid was removed. Samples were sent to the laboratory as requested by the clinical team. IMPRESSION: Successful ultrasound guided diagnostic and therapeutic right thoracentesis yielding 750 cc of pleural fluid. Read by: Rowe Robert, PA-C Electronically Signed   By: Jerilynn Mages.  Shick M.D.   On: 07/12/2021 12:40      ASSESSMENT & PLAN:  1. Metastatic breast cancer ([place])   2. Bilateral pleural effusion   3. Metastasis to bone ([place])   4. Neoplasm related pain   5. Acute respiratory failure with hypoxia ([facility])   6. Encounter for antineoplastic chemotherapy    # Metastatic Breast cancer with extensive thoracic and bone involvement, in visceral crisis, ER+, PR+ HER2 neg MRI brain is negative Patient tolerated Taxol treatments well.  Minimal neuropathy symptoms. Labs reviewed and discussed with patient Proceed with Taxol treatment today . #Extensive bone metastasis. Patient is currently undergoing lumbar/sacrum palliative radiation. After that patient will get additional radiation to her thoracic spine and cervical spine. Discussed with Dr. Baruch Gouty Continue dexamethasone, 2 mg for this week.  We will further taper down next week.  # Neoplasm related pain,  Continue oxycodone 66m Q12h PRN for pain. Symptom is well controlled.   #Anxiety and insomnia, recommend low-dose Ativan 0.5 mg QHS PRN #Stress incontinence. Monitor symptoms.     All questions were answered. The patient knows to call the clinic with any problems questions or concerns.  cc AGayland Curry MD    Follow up in 1 week.   ZEarlie Server MD, PhD [facility] 08/04/2021

## 2021-08-05 ENCOUNTER — Ambulatory Visit
Admission: RE | Admit: 2021-08-05 | Discharge: 2021-08-05 | Disposition: A | Discharge status: Home / Self Care | Payer: Medicare PPO | Source: Ambulatory Visit | Attending: Radiation Oncology | Admitting: Radiation Oncology

## 2021-08-05 ENCOUNTER — Ambulatory Visit: Payer: Medicare PPO

## 2021-08-05 DIAGNOSIS — Z5111 Encounter for antineoplastic chemotherapy: Secondary | ICD-10-CM | POA: Diagnosis not present

## 2021-08-06 ENCOUNTER — Ambulatory Visit
Admission: RE | Admit: 2021-08-06 | Discharge: 2021-08-06 | Disposition: A | Discharge status: Home / Self Care | Payer: Medicare PPO | Source: Ambulatory Visit | Attending: Radiation Oncology | Admitting: Radiation Oncology

## 2021-08-06 DIAGNOSIS — Z5111 Encounter for antineoplastic chemotherapy: Secondary | ICD-10-CM | POA: Diagnosis not present

## 2021-08-07 ENCOUNTER — Ambulatory Visit
Admission: RE | Admit: 2021-08-07 | Discharge: 2021-08-07 | Disposition: A | Discharge status: Home / Self Care | Payer: Medicare PPO | Source: Ambulatory Visit | Attending: Radiation Oncology | Admitting: Radiation Oncology

## 2021-08-07 DIAGNOSIS — Z5111 Encounter for antineoplastic chemotherapy: Secondary | ICD-10-CM | POA: Diagnosis not present

## 2021-08-08 ENCOUNTER — Ambulatory Visit
Admission: RE | Admit: 2021-08-08 | Discharge: 2021-08-08 | Disposition: A | Discharge status: Home / Self Care | Payer: Medicare PPO | Source: Ambulatory Visit | Attending: Radiation Oncology | Admitting: Radiation Oncology

## 2021-08-08 DIAGNOSIS — Z5111 Encounter for antineoplastic chemotherapy: Secondary | ICD-10-CM | POA: Diagnosis not present

## 2021-08-11 ENCOUNTER — Other Ambulatory Visit: Payer: Self-pay

## 2021-08-11 ENCOUNTER — Encounter: Payer: Self-pay | Admitting: Oncology

## 2021-08-11 ENCOUNTER — Inpatient Hospital Stay: Payer: Medicare PPO

## 2021-08-11 ENCOUNTER — Ambulatory Visit: Admission: RE | Admit: 2021-08-11 | Payer: Medicare PPO | Source: Ambulatory Visit

## 2021-08-11 ENCOUNTER — Inpatient Hospital Stay (HOSPITAL_BASED_OUTPATIENT_CLINIC_OR_DEPARTMENT_OTHER): Payer: Medicare PPO | Admitting: Oncology

## 2021-08-11 VITALS — BP 117/81 | HR 69 | Temp 97.9°F | Wt 176.7 lb

## 2021-08-11 DIAGNOSIS — Z5111 Encounter for antineoplastic chemotherapy: Secondary | ICD-10-CM

## 2021-08-11 DIAGNOSIS — G893 Neoplasm related pain (acute) (chronic): Secondary | ICD-10-CM

## 2021-08-11 DIAGNOSIS — E441 Mild protein-calorie malnutrition: Secondary | ICD-10-CM

## 2021-08-11 DIAGNOSIS — C50919 Malignant neoplasm of unspecified site of unspecified female breast: Secondary | ICD-10-CM

## 2021-08-11 DIAGNOSIS — C7951 Secondary malignant neoplasm of bone: Secondary | ICD-10-CM

## 2021-08-11 DIAGNOSIS — J90 Pleural effusion, not elsewhere classified: Secondary | ICD-10-CM

## 2021-08-11 LAB — COMPREHENSIVE METABOLIC PANEL
ALT: 21 U/L (ref 0–44)
AST: 24 U/L (ref 15–41)
Albumin: 3.3 g/dL — ABNORMAL LOW (ref 3.5–5.0)
Alkaline Phosphatase: 93 U/L (ref 38–126)
Anion gap: 6 (ref 5–15)
BUN: 25 mg/dL — ABNORMAL HIGH (ref 8–23)
CO2: 31 mmol/L (ref 22–32)
Calcium: 8.1 mg/dL — ABNORMAL LOW (ref 8.9–10.3)
Chloride: 96 mmol/L — ABNORMAL LOW (ref 98–111)
Creatinine, Ser: 0.54 mg/dL (ref 0.44–1.00)
GFR, Estimated: >60 mL/min (ref >60)
Glucose, Bld: 111 mg/dL — ABNORMAL HIGH (ref 70–99)
Potassium: 3.4 mmol/L — ABNORMAL LOW (ref 3.5–5.1)
Sodium: 133 mmol/L — ABNORMAL LOW (ref 135–145)
Total Bilirubin: 0.9 mg/dL (ref 0.3–1.2)
Total Protein: 5.6 g/dL — ABNORMAL LOW (ref 6.5–8.1)

## 2021-08-11 LAB — CBC WITH DIFFERENTIAL/PLATELET
Abs Immature Granulocytes: 0.33 10*3/uL — ABNORMAL HIGH (ref 0.00–0.07)
Basophils Absolute: 0.1 10*3/uL (ref 0.0–0.1)
Basophils Relative: 1 %
Eosinophils Absolute: 0.1 10*3/uL (ref 0.0–0.5)
Eosinophils Relative: 1 %
HCT: 38.3 % (ref 36.0–46.0)
Hemoglobin: 12.4 g/dL (ref 12.0–15.0)
Immature Granulocytes: 5 %
Lymphocytes Relative: 13 %
Lymphs Abs: 0.8 10*3/uL (ref 0.7–4.0)
MCH: 28.1 pg (ref 26.0–34.0)
MCHC: 32.4 g/dL (ref 30.0–36.0)
MCV: 86.8 fL (ref 80.0–100.0)
Monocytes Absolute: 0.6 10*3/uL (ref 0.1–1.0)
Monocytes Relative: 9 %
Neutro Abs: 4.4 10*3/uL (ref 1.7–7.7)
Neutrophils Relative %: 71 %
Platelets: 261 10*3/uL (ref 150–400)
RBC: 4.41 MIL/uL (ref 3.87–5.11)
RDW: 15.6 % — ABNORMAL HIGH (ref 11.5–15.5)
WBC: 6.3 10*3/uL (ref 4.0–10.5)
nRBC: 0.8 % — ABNORMAL HIGH (ref 0.0–0.2)

## 2021-08-11 MED ORDER — DIPHENHYDRAMINE HCL 50 MG/ML IJ SOLN
25.0000 mg | Freq: Once | INTRAMUSCULAR | Status: AC
  Administered 2021-08-11: 25 mg via INTRAVENOUS
  Filled 2021-08-11: qty 1

## 2021-08-11 MED ORDER — SODIUM CHLORIDE 0.9 % IV SOLN
80.0000 mg/m2 | Freq: Once | INTRAVENOUS | Status: AC
  Administered 2021-08-11: 156 mg via INTRAVENOUS
  Filled 2021-08-11: qty 26

## 2021-08-11 MED ORDER — OXYCODONE HCL 5 MG PO TABS: 5.0000 mg | ORAL_TABLET | Freq: Two times a day (BID) | ORAL | 0 refills | Status: DC | PRN

## 2021-08-11 MED ORDER — SODIUM CHLORIDE 0.9 % IV SOLN
Freq: Once | INTRAVENOUS | Status: AC
  Filled 2021-08-11: qty 250

## 2021-08-11 MED ORDER — FAMOTIDINE 20 MG IN NS 100 ML IVPB
20.0000 mg | Freq: Once | INTRAVENOUS | Status: AC
  Administered 2021-08-11: 20 mg via INTRAVENOUS
  Filled 2021-08-11: qty 20

## 2021-08-11 MED ORDER — DEXAMETHASONE 1 MG PO TABS: 1.0000 mg | ORAL_TABLET | Freq: Two times a day (BID) | ORAL | 0 refills | Status: DC

## 2021-08-11 MED ORDER — SODIUM CHLORIDE 0.9 % IV SOLN
10.0000 mg | Freq: Once | INTRAVENOUS | Status: AC
  Administered 2021-08-11: 10 mg via INTRAVENOUS
  Filled 2021-08-11: qty 10

## 2021-08-11 MED ORDER — HEPARIN SOD (PORK) LOCK FLUSH 100 UNIT/ML IV SOLN
INTRAVENOUS | Status: AC
  Administered 2021-08-11: 500 [IU]
  Filled 2021-08-11: qty 5

## 2021-08-11 MED ORDER — HEPARIN SOD (PORK) LOCK FLUSH 100 UNIT/ML IV SOLN
500.0000 [IU] | Freq: Once | INTRAVENOUS | Status: AC | PRN
  Filled 2021-08-11: qty 5

## 2021-08-11 NOTE — Patient Instructions (Signed)
[facility] [place] [facility]  Discharge Instructions: Thank you for choosing [facility] to provide your oncology and hematology care.  If you have a lab appointment with the Cancer Center, please go directly to the Cancer Center and check in at the registration area.  Wear comfortable clothing and clothing appropriate for easy access to any Portacath or PICC line.   We strive to give you quality time with your provider. You may need to reschedule your appointment if you arrive late (15 or more minutes).  Arriving late affects you and other patients whose appointments are after yours.  Also, if you miss three or more appointments without notifying the office, you may be dismissed from the clinic at the provider's discretion.      For prescription refill requests, have your pharmacy contact our office and allow 72 hours for refills to be completed.    Today you received the following chemotherapy and/or immunotherapy agents Taxol        To help prevent nausea and vomiting after your treatment, we encourage you to take your nausea medication as directed.  BELOW ARE SYMPTOMS THAT SHOULD BE REPORTED IMMEDIATELY: *FEVER GREATER THAN 100.4 F (38 C) OR HIGHER *CHILLS OR SWEATING *NAUSEA AND VOMITING THAT IS NOT CONTROLLED WITH YOUR NAUSEA MEDICATION *UNUSUAL SHORTNESS OF BREATH *UNUSUAL BRUISING OR BLEEDING *URINARY PROBLEMS (pain or burning when urinating, or frequent urination) *BOWEL PROBLEMS (unusual diarrhea, constipation, pain near the anus) TENDERNESS IN MOUTH AND THROAT WITH OR WITHOUT PRESENCE OF ULCERS (sore throat, sores in mouth, or a toothache) UNUSUAL RASH, SWELLING OR PAIN  UNUSUAL VAGINAL DISCHARGE OR ITCHING   Items with * indicate a potential emergency and should be followed up as soon as possible or go to the Emergency Department if any problems should occur.  Please show the CHEMOTHERAPY ALERT CARD or IMMUNOTHERAPY ALERT CARD at check-in to  the Emergency Department and triage nurse.  Should you have questions after your visit or need to cancel or reschedule your appointment, please contact [facility] [place] [facility]  [phone] and follow the prompts.  Office hours are 8:00 a.m. to 4:30 p.m. Monday - Friday. Please note that voicemails left after 4:00 p.m. may not be returned until the following business day.  We are closed weekends and major holidays. You have access to a nurse at all times for urgent questions. Please call the main number to the clinic [phone] and follow the prompts.  For any non-urgent questions, you may also contact your provider using MyChart. We now offer e-Visits for anyone 18 and older to request care online for non-urgent symptoms. For details visit [email or web address].   Also download the MyChart app! Go to the app store, search "MyChart", open the app, select [facility], and log in with your MyChart username and password.  Due to Covid, a mask is required upon entering the hospital/clinic. If you do not have a mask, one will be given to you upon arrival. For doctor visits, patients may have 1 support person aged 18 or older with them. For treatment visits, patients cannot have anyone with them due to current Covid guidelines and our immunocompromised population.  

## 2021-08-11 NOTE — Progress Notes (Signed)
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  female with PMH listed below was seen in consultation at the request of  Gayland Curry, MD  for evaluation of tumor in lumbar vertebrae  # patient reports feeling tightness and pain in Feb 2022. She was started on conservative management which did not help her symptoms. She also started to experience numbness.  She takes gabapentin which partially relieve the discomfort. She denies bowel or bladder incontinence, no lower extremity weakness.   06/25/2021 MRI lumbar spine IMPRESSION:  1. 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 Additional metastatic involvement T12, L1 through L3.  No primary tumor site identified. Top differential considerations are Metastatic Disease Unknown primary, less likely Lymphoma or Multiple Myeloma.   2. Superimposed lumbar spine degeneration, including degenerative moderate to severe left L3 and L4 nerve level impingement from disc herniation.    Patient was seen by neurosurgeon Dr.Yarbrough today. He has ordered MRI cervical and thoracic spine and sacrum biopsy for further work up.   Patient has a history of breast cancer, diagnosed in 2010 Left-sided T2 (2.4cm) N0, ER/PR positive, HER2 negative IDC of the breast, s/p lumpectomy by Dr.Meyer at [facility]. Oncotype score of 11 who completed radiation and she  finished 10 years of [employer] from 05/2009 and stopped in 2020.  she has intentionally lost some weight, no night sweating, fever.   # 07/02/2021, CT chest abdomen pelvis showed innumerable small pulmonary and pleural nodules consistent with diffuse metastasis.  Associated with probable malignant pleural effusion.  Mediastinal and hilar lymphadenopathy consistent with metastatic disease.  Hepatic and bilateral adrenal gland metastasis.  Diffuse extensive destructive metastatic bone disease involving pelvis.   07/11/2021 - 07/14/2021  Patient was admitted to hospital due to shortness of breath. 07/12/2021, CT chest PE protocol showed no PE.  Further increase of March pleural effusion and admit to moderate interstitial pulmonary edema.  Bulky mediastinal and hilar lymphadenopathy.  Unchanged.  Incompletely visualized left adrenal nodule. 07/12/2021 patient underwent right thoracentesis.  Cytology was positive for metastatic carcinoma, compatible with breast origin.  ER/PR/HER2 status is pending.  07/16/2021, patient underwent iliac bone biopsy.  Positive for metastatic carcinoma. 07/25/2021, patient underwent thoracentesis of right side, removed 550 cc straw-colored pleural fluid.  She tolerated procedure well. Today she reports shortness of breath with exertion has slightly improved.  She uses home oxygen 2 L  INTERVAL HISTORY Amber Blevins is a 70 y.o. female who has above history reviewed by me today presents for follow up visit for management of metastatic breast cancer . Readings are stable.  Remains off oxygen. Lower back/sacrum pain is improved.  She is on oxycodone every 12 hours as needed. Occasional diarrhea which improved with Imodium.   Minimal numbness tingling of the tip of her hands and feet Review of Systems  Constitutional:  Negative for appetite change, chills, fatigue and fever.  HENT:   Negative for hearing loss and voice change.   Eyes:  Negative for eye problems.  Respiratory:  Negative for chest tightness and cough.   Cardiovascular:  Negative for chest pain.  Gastrointestinal:  Negative for abdominal distention, abdominal pain and blood in stool.  Endocrine: Negative for hot flashes.  Genitourinary:  Negative for difficulty urinating and frequency.   Musculoskeletal:  Positive for back pain. Negative for arthralgias.       Lower back/sacrum pain and numbness  Skin:  Negative for itching and rash.  Neurological:  Negative for extremity weakness.  Hematological:  Negative for adenopathy.  Psychiatric/Behavioral:  Negative for confusion.    MEDICAL HISTORY:  Past Medical History:  Diagnosis Date   Breast cancer ([place])    COPD (chronic obstructive pulmonary disease) ([place])    Family history of adverse reaction to anesthesia    brother has problem with coming out of anesthesia   High cholesterol    Hypertension    Personal history of radiation therapy    Pre-diabetes     SURGICAL HISTORY: Past Surgical History:  Procedure Laterality Date   BREAST LUMPECTOMY Left    2010   COLONOSCOPY     EYE SURGERY Left    Cataract surgery   PORTACATH PLACEMENT N/A 08/01/2021   Procedure: INSERTION PORT-A-CATH;  Surgeon: Herbert Pun, MD;  Location: [facility] ORS;  Service: General;  Laterality: N/A;   thorocentesis Right 07/10/2021   and another on 07/25/21    SOCIAL HISTORY: Social History   Socioeconomic History   Marital status: Married    Spouse name: Not on file   Number of children: Not on file   Years of education: Not on file   Highest education level: Not on file  Occupational History   Not on file  Tobacco Use   Smoking status: Former    Packs/day: 1.00    Years: 25.00    Pack years: 25.00    Types: Cigarettes    Quit date: 25    Years since quitting: 32.8   Smokeless tobacco: Never  Vaping Use   Vaping Use: Never used  Substance and Sexual Activity   Alcohol use: Not Currently   Drug use: Never   Sexual activity:  Not on file  Other Topics Concern   Not on file  Social History Narrative   Not on file   Social Determinants of Health   Financial Resource Strain: Not on file  Food Insecurity: Not on file  Transportation Needs: Not on file  Physical Activity: Not on file  Stress: Not on file  Social Connections: Not on file  Intimate Partner Violence: Not on file    FAMILY HISTORY: Family History  Problem Relation Age of Onset   Cancer Mother        gynecological   Lung cancer Mother    Diabetes Father    Heart disease Father    Parkinson's disease Father    Brain cancer Brother    Bladder Cancer Brother    Pulmonary disease Brother    Rheumatic fever Brother    Breast cancer Neg Hx     ALLERGIES:  is allergic to green tea (camellia sinensis), melaleuca viridiflora, and lisinopril.  MEDICATIONS:  Current Outpatient Medications  Medication Sig Dispense Refill   acetaminophen (TYLENOL) 650 MG CR tablet Take 650 mg by mouth every 8 (eight) hours as needed for pain.     albuterol (VENTOLIN HFA) 108 (90 Base) MCG/ACT inhaler Inhale 2 puffs into the lungs every 6 (six) hours as needed.     aspirin 81 MG EC tablet Take by  mouth.     atorvastatin (LIPITOR) 40 MG tablet Take 40 mg by mouth at bedtime.     Boswellia-Glucosamine-Vit D (OSTEO BI-FLEX-GLUCOS/5-LOXIN) TABS Take 1 capsule by mouth in the morning and at bedtime.     Calcium Carb-Cholecalciferol 600-400 MG-UNIT TABS Take 1 tablet by mouth daily.     dexamethasone (DECADRON) 1 MG tablet Take 1 tablet (1 mg total) by mouth 2 (two) times daily with a meal. 7 tablet 0   Docusate Sodium (DSS) 100 MG CAPS Take 1 capsule by mouth every other day.     Fluticasone-Umeclidin-Vilant (TRELEGY ELLIPTA) 100-62.5-25 MCG/INH AEPB Inhale into the lungs. Inhale 1 inhalation into the lungs once daily     gabapentin (NEURONTIN) 300 MG capsule Take 2 capsules by mouth in the morning, at noon, and at bedtime.     ibuprofen (ADVIL) 200 MG tablet Take by  mouth. Take 400 mg by mouth every six (6) hours as needed for pain.     lidocaine-prilocaine (EMLA) cream Apply 1 application topically as needed. 30 g 5   Loperamide HCl (IMODIUM PO) Take by mouth.     LORazepam (ATIVAN) 0.5 MG tablet Take 1 tablet (0.5 mg total) by mouth at bedtime as needed for anxiety. 30 tablet 0   magnesium chloride (SLOW-MAG) 64 MG TBEC SR tablet Take 1 tablet by mouth at bedtime.     metoprolol-hydrochlorothiazide (LOPRESSOR HCT) 50-25 MG tablet Take 1 tablet by mouth daily. Take 1 tablet by mouth once daily     ondansetron (ZOFRAN) 8 MG tablet Take 1 tablet (8 mg total) by mouth 2 (two) times daily as needed (Nausea or vomiting). 30 tablet 1   potassium chloride SA (KLOR-CON) 20 MEQ tablet Take 1 tablet (20 mEq total) by mouth daily. 30 tablet 0   prochlorperazine (COMPAZINE) 10 MG tablet Take 1 tablet (10 mg total) by mouth every 6 (six) hours as needed (Nausea or vomiting). 30 tablet 1   Turmeric (QC TUMERIC COMPLEX PO) Take 1 capsule by mouth at bedtime.     oxyCODONE (OXY IR/ROXICODONE) 5 MG immediate release tablet Take 1 tablet (5 mg total) by mouth every 12 (twelve) hours as needed for severe pain or moderate pain. 60 tablet 0   No current facility-administered medications for this visit.     PHYSICAL EXAMINATION: ECOG PERFORMANCE STATUS: 1 - Symptomatic but completely ambulatory Vitals:   08/11/21 0839  BP: 117/81  Pulse: 69  Temp: 97.9 F (36.6 C)   Filed Weights   08/11/21 0839  Weight: 176 lb 11.2 oz (80.2 kg)    Physical Exam Constitutional:      General: She is not in acute distress. HENT:     Head: Normocephalic and atraumatic.  Eyes:     General: No scleral icterus. Cardiovascular:     Rate and Rhythm: Normal rate and regular rhythm.     Heart sounds: Normal heart sounds.  Pulmonary:     Effort: Pulmonary effort is normal. No respiratory distress.     Breath sounds: No wheezing.     Comments: Decreased breath sound  bibasilar Abdominal:     General: Bowel sounds are normal. There is no distension.     Palpations: Abdomen is soft.  Musculoskeletal:        General: No deformity. Normal range of motion.     Cervical back: Normal range of motion and neck supple.  Skin:    General: Skin is warm and dry.     Findings: No erythema or  rash.  Neurological:     Mental Status: She is alert and oriented to person, place, and time. Mental status is at baseline.     Cranial Nerves: No cranial nerve deficit.     Coordination: Coordination normal.  Psychiatric:        Mood and Affect: Mood normal.    LABORATORY DATA:  I have reviewed the data as listed Lab Results  Component Value Date   WBC 6.3 08/11/2021   HGB 12.4 08/11/2021   HCT 38.3 08/11/2021   MCV 86.8 08/11/2021   PLT 261 08/11/2021   Recent Labs    07/28/21 0752 08/04/21 0748 08/11/21 0825  NA 132* 131* 133*  K 4.4 3.3* 3.4*  CL 89* 93* 96*  CO2 33* 30 31  GLUCOSE 137* 107* 111*  BUN 21 23 25*  CREATININE 0.74 0.65 0.54  CALCIUM 8.8* 8.4* 8.1*  GFRNONAA >60 >60 >60  PROT 6.9 6.2* 5.6*  ALBUMIN 3.9 3.5 3.3*  AST _0 ALT _1 ALKPHOS 99 96 93  BILITOT 1.1 1.1 0.9    Iron/TIBC/Ferritin/ %Sat No results found for: IRON, TIBC, FERRITIN, IRONPCTSAT    RADIOGRAPHIC STUDIES: I have personally reviewed the radiological images as listed and agreed with the findings in the report. MR Brain W Wo Contrast  Result Date: 07/28/2021 CLINICAL DATA:  Breast cancer. EXAM: MRI HEAD WITHOUT AND WITH CONTRAST TECHNIQUE: Multiplanar, multiecho pulse sequences of the brain and surrounding structures were obtained without and with intravenous contrast. CONTRAST:  2m GADAVIST GADOBUTROL 1 MMOL/ML IV SOLN Adequate IV access could not be obtained despite assistance from the IV team. A portion of the planned contrast volume was able to be injected through a butterfly needle before the vein ruptured. COMPARISON:  None. FINDINGS: Brain: There  is no evidence of an acute infarct, intracranial hemorrhage, mass, midline shift, or extra-axial fluid collection. The ventricles and sulci are normal. A few small T2 hyperintensities in the cerebral white matter and pons are nonspecific but compatible with minimal chronic small vessel ischemic disease. No enhancing brain lesions are identified, however sensitivity for detection of small lesions is reduced by the incomplete contrast injection. Vascular: Major intracranial vascular flow voids are preserved. Skull and upper cervical spine: Unremarkable bone marrow signal. Sinuses/Orbits: Left cataract extraction. Mucous retention cyst in the left maxillary sinus. Trace left mastoid effusion. Suspected 1 cm epidermal inclusion cyst in the right cheek region. Other: None. IMPRESSION: 1. No evidence of intracranial metastases within above detailed study limitations. 2. Minimal chronic small vessel ischemic disease. Electronically Signed   By: ALogan BoresM.D.   On: 07/28/2021 17:00   MR CERVICAL SPINE W WO CONTRAST  Result Date: 07/25/2021 CLINICAL DATA:  Initial evaluation for metastatic disease. History of breast cancer. EXAM: MRI CERVICAL AND THORACIC SPINE WITHOUT AND WITH CONTRAST TECHNIQUE: Multiplanar and multiecho pulse sequences of the cervical spine, to include the craniocervical junction and cervicothoracic junction, and the thoracic spine, were obtained without and with intravenous contrast. CONTRAST:  873mGADAVIST GADOBUTROL 1 MMOL/ML IV SOLN COMPARISON:  None available. FINDINGS: MRI CERVICAL SPINE FINDINGS Alignment: Straightening of the normal cervical lordosis. No listhesis. Vertebrae: T1 hypointense, stir hyperintense enhancing lesion largely replacing the C6 vertebral body, consistent with an osseous metastatic implant. An additional separate implant seen involving the C6 spinous process (series 35, image 7). No other metastatic disease within the cervical spine itself, although note is made of  additional metastatic implants involving the upper thoracic spine  at the levels of T2 and T3, described on corresponding thoracic spine portion of this report. No pathologic fracture or significant extra osseous extension of tumor. Bone marrow signal intensity otherwise normal. Vertebral body height maintained. No other abnormal marrow edema or enhancement. Cord: Normal signal and morphology. No epidural or intramedullary tumor. No abnormal enhancement. Posterior Fossa, vertebral arteries, paraspinal tissues: Visualized brain and posterior fossa within normal limits. Craniocervical junction normal. Paraspinous soft tissues within normal limits. Normal flow voids seen within the vertebral arteries bilaterally. Layering bilateral pleural effusions partially visualized. Disc levels: C2-C3: Mild uncovertebral spurring without significant disc bulge. Mild facet hypertrophy on the right. No stenosis. C3-C4: Disc bulge with endplate and uncovertebral spurring. Bilateral facet hypertrophy. Mild to moderate spinal stenosis with moderate left worse than right C4 foraminal narrowing. C4-C5: Disc bulge with endplate and uncovertebral spurring, worse on the right. Right-sided facet hypertrophy. Mild canal with moderate right C5 foraminal stenosis. C5-C6: Mild disc bulge with uncovertebral spurring. Mild to moderate bilateral C6 foraminal narrowing, worse on the right. No spinal stenosis. C6-C7: Disc bulge with bilateral uncovertebral hypertrophy. Flattening of the ventral thecal sac with borderline mild spinal stenosis. Moderate to severe bilateral C7 foraminal narrowing, worse on the left. C7-T1:  Negative. MRI THORACIC SPINE FINDINGS Alignment: Straightening of the normal midthoracic kyphosis. No listhesis. Vertebrae: Multiple scattered osseous metastases seen throughout the thoracic spine. There is involvement of most levels, with most prominent lesions seen involving the T3, T5, T6, T7, and T10 vertebral bodies. Associated  pathologic fracture at the inferior endplate of T7 with mild height loss but no bony retropulsion. Additional mild concavity about the T3 and T10 vertebral bodies also suspicious for associated compression fractures. Mild extra osseous extension about a lesion involving the spinous process of T2 (series 34, image 8). Probable early epidural extension into the right neural foramina of T2-3 (series 27, image 6) and T9-10 (series 27, image 4). No other significant extra osseous or epidural extension at this time. Cord: Normal signal and morphology. Probable early epidural extension into the right neural foramina at T2-3 and T9-10 as above. No other significant epidural involvement at this time. No intramedullary tumor or abnormal enhancement. Paraspinal and other soft tissues: 2.1 cm soft tissue implant noted within the left posterior paraspinous soft tissues of the upper back at the level of T1-2 (series 34, image 15). Additional probable small soft tissue implant within the left posterior soft tissues at the level of T12 (series 34, image 14). Large layering bilateral pleural effusions noted. Disc levels: Underlying mild for age spondylosis. No significant disc bulge or focal disc herniation. No significant stenosis. IMPRESSION: MRI CERVICAL SPINE IMPRESSION: 1. Osseous metastatic disease involving the C6 vertebral body and C6 spinous process. No other evidence for metastatic disease within the cervical spine. No extra osseous extension or epidural involvement. 2. Underlying cervical spondylosis with resultant mild to moderate spinal stenosis at C3-4 and C4-5. Moderate bilateral C4 and right C5 foraminal narrowing, with moderate to severe bilateral C7 foraminal stenosis. MRI THORACIC SPINE IMPRESSION: 1. Diffuse osseous metastatic disease throughout the thoracic spine as detailed above. Associated pathologic compression fracture of T7, with additional probable mild compression fractures of T3 and T10. 2. Mild/early  epidural extension into the right neural foramina at T2-3 and T9-10. No other significant epidural tumor at this time. 3. Extra osseous extension tumor about a lesion involving the T2 spinous process. 4. Few soft tissue implants involving the left posterior paraspinous soft tissues at the levels of T1-2  and T12 as above. 5. Large layering bilateral pleural effusions. Electronically Signed   By: Jeannine Boga M.D.   On: 07/25/2021 05:23   MR THORACIC SPINE W WO CONTRAST  Result Date: 07/25/2021 CLINICAL DATA:  Initial evaluation for metastatic disease. History of breast cancer. EXAM: MRI CERVICAL AND THORACIC SPINE WITHOUT AND WITH CONTRAST TECHNIQUE: Multiplanar and multiecho pulse sequences of the cervical spine, to include the craniocervical junction and cervicothoracic junction, and the thoracic spine, were obtained without and with intravenous contrast. CONTRAST:  83m GADAVIST GADOBUTROL 1 MMOL/ML IV SOLN COMPARISON:  None available. FINDINGS: MRI CERVICAL SPINE FINDINGS Alignment: Straightening of the normal cervical lordosis. No listhesis. Vertebrae: T1 hypointense, stir hyperintense enhancing lesion largely replacing the C6 vertebral body, consistent with an osseous metastatic implant. An additional separate implant seen involving the C6 spinous process (series 35, image 7). No other metastatic disease within the cervical spine itself, although note is made of additional metastatic implants involving the upper thoracic spine at the levels of T2 and T3, described on corresponding thoracic spine portion of this report. No pathologic fracture or significant extra osseous extension of tumor. Bone marrow signal intensity otherwise normal. Vertebral body height maintained. No other abnormal marrow edema or enhancement. Cord: Normal signal and morphology. No epidural or intramedullary tumor. No abnormal enhancement. Posterior Fossa, vertebral arteries, paraspinal tissues: Visualized brain and posterior  fossa within normal limits. Craniocervical junction normal. Paraspinous soft tissues within normal limits. Normal flow voids seen within the vertebral arteries bilaterally. Layering bilateral pleural effusions partially visualized. Disc levels: C2-C3: Mild uncovertebral spurring without significant disc bulge. Mild facet hypertrophy on the right. No stenosis. C3-C4: Disc bulge with endplate and uncovertebral spurring. Bilateral facet hypertrophy. Mild to moderate spinal stenosis with moderate left worse than right C4 foraminal narrowing. C4-C5: Disc bulge with endplate and uncovertebral spurring, worse on the right. Right-sided facet hypertrophy. Mild canal with moderate right C5 foraminal stenosis. C5-C6: Mild disc bulge with uncovertebral spurring. Mild to moderate bilateral C6 foraminal narrowing, worse on the right. No spinal stenosis. C6-C7: Disc bulge with bilateral uncovertebral hypertrophy. Flattening of the ventral thecal sac with borderline mild spinal stenosis. Moderate to severe bilateral C7 foraminal narrowing, worse on the left. C7-T1:  Negative. MRI THORACIC SPINE FINDINGS Alignment: Straightening of the normal midthoracic kyphosis. No listhesis. Vertebrae: Multiple scattered osseous metastases seen throughout the thoracic spine. There is involvement of most levels, with most prominent lesions seen involving the T3, T5, T6, T7, and T10 vertebral bodies. Associated pathologic fracture at the inferior endplate of T7 with mild height loss but no bony retropulsion. Additional mild concavity about the T3 and T10 vertebral bodies also suspicious for associated compression fractures. Mild extra osseous extension about a lesion involving the spinous process of T2 (series 34, image 8). Probable early epidural extension into the right neural foramina of T2-3 (series 27, image 6) and T9-10 (series 27, image 4). No other significant extra osseous or epidural extension at this time. Cord: Normal signal and  morphology. Probable early epidural extension into the right neural foramina at T2-3 and T9-10 as above. No other significant epidural involvement at this time. No intramedullary tumor or abnormal enhancement. Paraspinal and other soft tissues: 2.1 cm soft tissue implant noted within the left posterior paraspinous soft tissues of the upper back at the level of T1-2 (series 34, image 15). Additional probable small soft tissue implant within the left posterior soft tissues at the level of T12 (series 34, image 14). Large layering bilateral pleural  effusions noted. Disc levels: Underlying mild for age spondylosis. No significant disc bulge or focal disc herniation. No significant stenosis. IMPRESSION: MRI CERVICAL SPINE IMPRESSION: 1. Osseous metastatic disease involving the C6 vertebral body and C6 spinous process. No other evidence for metastatic disease within the cervical spine. No extra osseous extension or epidural involvement. 2. Underlying cervical spondylosis with resultant mild to moderate spinal stenosis at C3-4 and C4-5. Moderate bilateral C4 and right C5 foraminal narrowing, with moderate to severe bilateral C7 foraminal stenosis. MRI THORACIC SPINE IMPRESSION: 1. Diffuse osseous metastatic disease throughout the thoracic spine as detailed above. Associated pathologic compression fracture of T7, with additional probable mild compression fractures of T3 and T10. 2. Mild/early epidural extension into the right neural foramina at T2-3 and T9-10. No other significant epidural tumor at this time. 3. Extra osseous extension tumor about a lesion involving the T2 spinous process. 4. Few soft tissue implants involving the left posterior paraspinous soft tissues at the levels of T1-2 and T12 as above. 5. Large layering bilateral pleural effusions. Electronically Signed   By: Jeannine Boga M.D.   On: 07/25/2021 05:23   [place] CHEST (PLEURAL EFFUSION)  Result Date: 07/14/2021 CLINICAL DATA:  70 year old female  with history of pleural effusion. EXAM: CHEST ULTRASOUND COMPARISON:  None. FINDINGS: Trace bilateral pleural effusions. IMPRESSION: Trace bilateral pleural effusions. No safe window for thoracentesis. Ruthann Cancer, MD [facility] Electronically Signed   By: Ruthann Cancer M.D.   On: 07/14/2021 15:08   DG Chest Port 1 View  Result Date: 08/01/2021 CLINICAL DATA:  Port-A-Cath placement.  Breast carcinoma. EXAM: PORTABLE CHEST 1 VIEW COMPARISON:  07/25/2021 FINDINGS: Right-sided Port-A-Cath is now seen with tip overlying the superior cavoatrial junction. No evidence of pneumothorax. Diffuse interstitial infiltrates are again seen, without significant change. Probable small right pleural effusion is also stable. Heart size is within normal limits. Aortic atherosclerotic calcification noted. IMPRESSION: Right-sided Port-A-Cath in appropriate position. No pneumothorax visualized. Stable diffuse interstitial infiltrates and probable small right pleural effusion. Electronically Signed   By: Marlaine Hind M.D.   On: 08/01/2021 17:14   DG Chest Port 1 View  Result Date: 07/25/2021 CLINICAL DATA:  Status post right-sided thoracentesis, remote history of breast cancer EXAM: PORTABLE CHEST 1 VIEW COMPARISON:  07/12/2021 FINDINGS: Single frontal view of the chest demonstrates a stable cardiac silhouette. There are trace bilateral pleural effusions. No evidence of pneumothorax. Bronchovascular prominence with patchy bibasilar consolidation unchanged. No acute bony abnormalities. IMPRESSION: 1. Trace bilateral pleural effusions. 2. No evidence of pneumothorax. 3. Stable bronchovascular prominence and bibasilar consolidation. Electronically Signed   By: Randa Ngo M.D.   On: 07/25/2021 16:17   DG C-Arm 1-60 Min-No Report  Result Date: 08/01/2021 Fluoroscopy was utilized by the requesting physician.  No radiographic interpretation.   ECHOCARDIOGRAM  COMPLETE  Result Date: 07/13/2021    ECHOCARDIOGRAM REPORT   Patient Name:   Amber Blevins Date of Exam: 07/13/2021 Medical Rec #:  [id number]          Height:       65.0 in Accession #:    [id number]         Weight:       182.1 lb Date of Birth:  1951-09-15          BSA:          1.901 m Patient Age:    74 years           BP:  151/79 mmHg Patient Gender: F                  HR:           100 bpm. Exam Location:  [facility] Procedure: 2D Echo, Cardiac Doppler and Color Doppler Indications:     SOB (shortness of breath) [[id number]]  History:         Patient has no prior history of Echocardiogram examinations.                  Risk Factors:Hypertension.  Sonographer:     Alyse Low Roar Referring Phys:  [place] Diagnosing Phys: Ida Rogue MD IMPRESSIONS  1. Left ventricular ejection fraction, by estimation, is 60 to 65%. The left ventricle has normal function. The left ventricle has no regional wall motion abnormalities. There is mild left ventricular hypertrophy. Left ventricular diastolic parameters are consistent with Grade I diastolic dysfunction (impaired relaxation).  2. Right ventricular systolic function is normal. The right ventricular size is normal. Tricuspid regurgitation signal is inadequate for assessing PA pressure.  3. The mitral valve is normal in structure. No evidence of mitral valve regurgitation. No evidence of mitral stenosis. FINDINGS  Left Ventricle: Left ventricular ejection fraction, by estimation, is 60 to 65%. The left ventricle has normal function. The left ventricle has no regional wall motion abnormalities. The left ventricular internal cavity size was normal in size. There is  mild left ventricular hypertrophy. Left ventricular diastolic parameters are consistent with Grade I diastolic dysfunction (impaired relaxation). Right Ventricle: The right ventricular size is normal. No increase in right ventricular wall thickness. Right ventricular systolic function is  normal. Tricuspid regurgitation signal is inadequate for assessing PA pressure. Left Atrium: Left atrial size was normal in size. Right Atrium: Right atrial size was normal in size. Pericardium: There is no evidence of pericardial effusion. Mitral Valve: The mitral valve is normal in structure. No evidence of mitral valve regurgitation. No evidence of mitral valve stenosis. Tricuspid Valve: The tricuspid valve is normal in structure. Tricuspid valve regurgitation is not demonstrated. No evidence of tricuspid stenosis. Aortic Valve: The aortic valve was not well visualized. Aortic valve regurgitation is not visualized. No aortic stenosis is present. Aortic valve peak gradient measures 9.4 mmHg. Pulmonic Valve: The pulmonic valve was normal in structure. Pulmonic valve regurgitation is not visualized. No evidence of pulmonic stenosis. Aorta: The aortic root is normal in size and structure. Venous: The inferior vena cava is normal in size with greater than 50% respiratory variability, suggesting right atrial pressure of 3 mmHg. IAS/Shunts: No atrial level shunt detected by color flow Doppler.  LEFT VENTRICLE PLAX 2D LVIDd:         3.91 cm   Diastology LVIDs:         2.70 cm   LV e' medial:    4.24 cm/s LV PW:         1.16 cm   LV E/e' medial:  17.9 LV IVS:        1.31 cm   LV e' lateral:   5.87 cm/s LVOT diam:     1.80 cm   LV E/e' lateral: 12.9 LVOT Area:     2.54 cm  RIGHT VENTRICLE RV Basal diam:  2.51 cm RV Mid diam:    2.14 cm RV S prime:     12.10 cm/s TAPSE (M-mode): 2.2 cm LEFT ATRIUM             Index  RIGHT ATRIUM          Index LA diam:        3.40 cm 1.79 cm/m   RA Area:     9.27 cm LA Vol (A2C):   45.1 ml 23.73 ml/m  RA Volume:   15.90 ml 8.36 ml/m LA Vol (A4C):   33.1 ml 17.41 ml/m LA Biplane Vol: 40.3 ml 21.20 ml/m  AORTIC VALVE                 PULMONIC VALVE AV Area (Vmax): 1.73 cm     PV Vmax:          1.02 m/s AV Vmax:        153.00 cm/s  PV Peak grad:     4.2 mmHg AV Peak Grad:   9.4  mmHg     PR End Diast Vel: 7.95 msec LVOT Vmax:      104.00 cm/s  RVOT Peak grad:   2 mmHg  AORTA Ao Root diam: 2.60 cm MITRAL VALVE MV Area (PHT): 4.41 cm     SHUNTS MV Decel Time: 172 msec     Systemic Diam: 1.80 cm MV E velocity: 75.80 cm/s MV A velocity: 105.00 cm/s MV E/A ratio:  0.72 MV A Prime:    12.7 cm/s Ida Rogue MD Electronically signed by Ida Rogue MD Signature Date/Time: 07/13/2021/1:49:22 PM    Final    CT BONE TROCAR/NEEDLE BIOPSY DEEP  Result Date: 07/16/2021 INDICATION: History of breast cancer, now with extensive osseous metastatic disease worrisome for metastatic breast cancer. Please perform CT-guided biopsy for tissue diagnostic purposes. EXAM: CT-GUIDED BONE LESION BIOPSY MEDICATIONS: None ANESTHESIA/SEDATION: Fentanyl 100 mcg IV; Versed 2 mg IV Sedation Time: 16 Minutes; The patient was continuously monitored during the procedure by the interventional radiology nurse under my direct supervision. COMPLICATIONS: None immediate. PROCEDURE: Informed consent was obtained from the patient following an explanation of the procedure, risks, benefits and alternatives. The patient understands, agrees and consents for the procedure. All questions were addressed. A time out was performed prior to the initiation of the procedure. The patient was positioned prone and non-contrast localization CT was performed of the pelvis redemonstrated the infiltrative lesion involving the posterior aspect of the right ilium. The operative site was prepped and draped in the usual sterile fashion. Under sterile conditions and local anesthesia, a 22 gauge spinal needle was utilized for procedural planning. Next, an 11 gauge coaxial bone biopsy needle was advanced into the infiltrative lesion involving the posterior aspect the right ilium. Needle position was confirmed with CT imaging (image 8, series 3). Next, the inner 13 gauge bone biopsy device was utilized to acquire sample (representative image 10,  series 3). Next, the outer 11 gauge biopsy device was utilized to acquire 4 additional samples all under intermittent CT guidance. The needle was removed and superficial hemostasis was obtained with manual compression. A dressing was applied. The patient tolerated the procedure well without immediate post procedural complication. IMPRESSION: Successful CT guided biopsy of lytic lesion involving the posterior aspect of the right ilium. Electronically Signed   By: Sandi Mariscal M.D.   On: 07/16/2021 12:50   US THORACENTESIS ASP PLEURAL SPACE W/IMG GUIDE  Result Date: 07/25/2021 INDICATION: SOB EXAM: ULTRASOUND GUIDED RIGHT THORACENTESIS MEDICATIONS: None. COMPLICATIONS: None immediate. PROCEDURE: An ultrasound guided thoracentesis was thoroughly discussed with the patient and questions answered. The benefits, risks, alternatives and complications were also discussed. The patient understands and wishes to proceed with the procedure. Written consent  was obtained. Ultrasound was performed to localize and mark an adequate pocket of fluid in the right chest. The area was then prepped and draped in the normal sterile fashion. 1% Lidocaine was used for local anesthesia. Under ultrasound guidance a 19 gauge Yueh catheter was introduced. Thoracentesis was performed. The catheter was removed and a dressing applied. FINDINGS: A total of approximately 550 mL of clear straw-colored fluid was removed. IMPRESSION: Successful ultrasound guided right thoracentesis yielding 550 mL of clear straw-colored pleural fluid. Electronically Signed   By: Albin Felling M.D.   On: 07/25/2021 16:57      ASSESSMENT & PLAN:  1. Metastatic breast cancer ([place])   2. Bilateral pleural effusion   3. Metastasis to bone ([place])   4. Neoplasm related pain   5. Encounter for antineoplastic chemotherapy   6. Mild protein-calorie malnutrition ([place])    # Metastatic Breast cancer with extensive thoracic and bone involvement, in visceral crisis,  ER+, PR+ HER2 neg MRI brain is negative Patient tolerated Taxol treatments well.  Minimal neuropathy symptoms. Labs reviewed and discussed with patient. Proceed with Taxol today.  #Abdomen is decreasing.  Refer to nutritionist.. #Extensive bone metastasis. Patient has had lumbar/sacrum palliative radiation. There is plan for additional radiation to her thoracic spine and cervical spine-previously discussed with Dr. Baruch Gouty Continue taper down to 1 mg of dexamethasone daily for 7 days and then stop. Plan to consider bisphosphonate.  We will discuss  # Neoplasm related pain,  Continue oxycodone 29m Q12h PRN for pain. Symptom is well controlled.   #Anxiety and insomnia, continue low-dose Ativan 0.5 mg QHS PRN #Stress incontinence. Monitor symptoms.     All questions were answered. The patient knows to call the clinic with any problems questions or concerns.  cc AGayland Curry MD    Follow up in 1 week lab nurse practitioner Taxol Follow-up in 2 weeks lab MD Taxol treatments. Schedule CT chest abdomen pelvis around week of 09/01/2021.  ZEarlie Server MD, PhD  08/11/2021

## 2021-08-12 ENCOUNTER — Encounter: Payer: Self-pay | Admitting: Oncology

## 2021-08-12 ENCOUNTER — Ambulatory Visit
Admission: RE | Admit: 2021-08-12 | Discharge: 2021-08-12 | Disposition: A | Discharge status: Home / Self Care | Payer: Medicare PPO | Source: Ambulatory Visit | Attending: Radiation Oncology | Admitting: Radiation Oncology

## 2021-08-12 DIAGNOSIS — Z5111 Encounter for antineoplastic chemotherapy: Secondary | ICD-10-CM | POA: Diagnosis not present

## 2021-08-12 NOTE — Telephone Encounter (Signed)
Thank you :)

## 2021-08-12 NOTE — Telephone Encounter (Signed)
Called pt re: MyChart message. Pt states that diarrhea has stopped and she hasn't had any this morning. She reports diarrhea 2 nights in a row. She reports that she is taking imodium, drinking plenty of water and gatorade, and eating bland foods. She would like to monitor for now, but will call us back if not improved in the next 1-2 days. Encouraged her to continue supportive measures, as she has been doing.

## 2021-08-13 ENCOUNTER — Ambulatory Visit
Admission: RE | Admit: 2021-08-13 | Discharge: 2021-08-13 | Disposition: A | Discharge status: Home / Self Care | Payer: Medicare PPO | Source: Ambulatory Visit | Attending: Radiation Oncology | Admitting: Radiation Oncology

## 2021-08-13 DIAGNOSIS — Z5111 Encounter for antineoplastic chemotherapy: Secondary | ICD-10-CM | POA: Diagnosis not present

## 2021-08-14 ENCOUNTER — Ambulatory Visit
Admission: RE | Admit: 2021-08-14 | Discharge: 2021-08-14 | Disposition: A | Discharge status: Home / Self Care | Payer: Medicare PPO | Source: Ambulatory Visit | Attending: Radiation Oncology | Admitting: Radiation Oncology

## 2021-08-14 DIAGNOSIS — Z5111 Encounter for antineoplastic chemotherapy: Secondary | ICD-10-CM | POA: Diagnosis not present

## 2021-08-15 ENCOUNTER — Ambulatory Visit
Admission: RE | Admit: 2021-08-15 | Discharge: 2021-08-15 | Disposition: A | Discharge status: Home / Self Care | Payer: Medicare PPO | Source: Ambulatory Visit | Attending: Radiation Oncology | Admitting: Radiation Oncology

## 2021-08-15 DIAGNOSIS — Z5111 Encounter for antineoplastic chemotherapy: Secondary | ICD-10-CM | POA: Diagnosis not present

## 2021-08-18 ENCOUNTER — Inpatient Hospital Stay: Payer: Medicare PPO

## 2021-08-18 ENCOUNTER — Ambulatory Visit
Admission: RE | Admit: 2021-08-18 | Discharge: 2021-08-18 | Disposition: A | Discharge status: Home / Self Care | Payer: Medicare PPO | Source: Ambulatory Visit | Attending: Radiation Oncology | Admitting: Radiation Oncology

## 2021-08-18 ENCOUNTER — Inpatient Hospital Stay (HOSPITAL_BASED_OUTPATIENT_CLINIC_OR_DEPARTMENT_OTHER): Payer: Medicare PPO | Admitting: Nurse Practitioner

## 2021-08-18 ENCOUNTER — Other Ambulatory Visit: Payer: Self-pay

## 2021-08-18 VITALS — BP 121/70 | HR 78 | Temp 98.1°F | Resp 18 | Wt 178.3 lb

## 2021-08-18 DIAGNOSIS — C801 Malignant (primary) neoplasm, unspecified: Secondary | ICD-10-CM

## 2021-08-18 DIAGNOSIS — Z5111 Encounter for antineoplastic chemotherapy: Secondary | ICD-10-CM

## 2021-08-18 DIAGNOSIS — C50919 Malignant neoplasm of unspecified site of unspecified female breast: Secondary | ICD-10-CM

## 2021-08-18 DIAGNOSIS — C7951 Secondary malignant neoplasm of bone: Secondary | ICD-10-CM

## 2021-08-18 DIAGNOSIS — E441 Mild protein-calorie malnutrition: Secondary | ICD-10-CM

## 2021-08-18 DIAGNOSIS — M7989 Other specified soft tissue disorders: Secondary | ICD-10-CM | POA: Diagnosis not present

## 2021-08-18 LAB — COMPREHENSIVE METABOLIC PANEL
ALT: 26 U/L (ref 0–44)
AST: 25 U/L (ref 15–41)
Albumin: 3.5 g/dL (ref 3.5–5.0)
Alkaline Phosphatase: 96 U/L (ref 38–126)
Anion gap: 9 (ref 5–15)
BUN: 27 mg/dL — ABNORMAL HIGH (ref 8–23)
CO2: 28 mmol/L (ref 22–32)
Calcium: 8.2 mg/dL — ABNORMAL LOW (ref 8.9–10.3)
Chloride: 99 mmol/L (ref 98–111)
Creatinine, Ser: 0.66 mg/dL (ref 0.44–1.00)
GFR, Estimated: >60 mL/min (ref >60)
Glucose, Bld: 104 mg/dL — ABNORMAL HIGH (ref 70–99)
Potassium: 3.7 mmol/L (ref 3.5–5.1)
Sodium: 136 mmol/L (ref 135–145)
Total Bilirubin: 0.6 mg/dL (ref 0.3–1.2)
Total Protein: 5.9 g/dL — ABNORMAL LOW (ref 6.5–8.1)

## 2021-08-18 LAB — CBC WITH DIFFERENTIAL/PLATELET
Abs Immature Granulocytes: 0.2 10*3/uL — ABNORMAL HIGH (ref 0.00–0.07)
Basophils Absolute: 0 10*3/uL (ref 0.0–0.1)
Basophils Relative: 1 %
Eosinophils Absolute: 0.1 10*3/uL (ref 0.0–0.5)
Eosinophils Relative: 1 %
HCT: 35.3 % — ABNORMAL LOW (ref 36.0–46.0)
Hemoglobin: 11.6 g/dL — ABNORMAL LOW (ref 12.0–15.0)
Immature Granulocytes: 5 %
Lymphocytes Relative: 18 %
Lymphs Abs: 0.7 10*3/uL (ref 0.7–4.0)
MCH: 28.7 pg (ref 26.0–34.0)
MCHC: 32.9 g/dL (ref 30.0–36.0)
MCV: 87.4 fL (ref 80.0–100.0)
Monocytes Absolute: 0.4 10*3/uL (ref 0.1–1.0)
Monocytes Relative: 9 %
Neutro Abs: 2.7 10*3/uL (ref 1.7–7.7)
Neutrophils Relative %: 66 %
Platelets: 279 10*3/uL (ref 150–400)
RBC: 4.04 MIL/uL (ref 3.87–5.11)
RDW: 16.3 % — ABNORMAL HIGH (ref 11.5–15.5)
WBC: 4.1 10*3/uL (ref 4.0–10.5)
nRBC: 1.5 % — ABNORMAL HIGH (ref 0.0–0.2)

## 2021-08-18 MED ORDER — FAMOTIDINE 20 MG IN NS 100 ML IVPB
20.0000 mg | Freq: Once | INTRAVENOUS | Status: AC
  Administered 2021-08-18: 20 mg via INTRAVENOUS
  Filled 2021-08-18: qty 20

## 2021-08-18 MED ORDER — HEPARIN SOD (PORK) LOCK FLUSH 100 UNIT/ML IV SOLN
500.0000 [IU] | Freq: Once | INTRAVENOUS | Status: AC
  Filled 2021-08-18: qty 5

## 2021-08-18 MED ORDER — SODIUM CHLORIDE 0.9% FLUSH
10.0000 mL | INTRAVENOUS | Status: DC | PRN
  Administered 2021-08-18: 10 mL via INTRAVENOUS
  Filled 2021-08-18: qty 10

## 2021-08-18 MED ORDER — SODIUM CHLORIDE 0.9 % IV SOLN
80.0000 mg/m2 | Freq: Once | INTRAVENOUS | Status: AC
  Administered 2021-08-18: 156 mg via INTRAVENOUS
  Filled 2021-08-18: qty 26

## 2021-08-18 MED ORDER — DIPHENHYDRAMINE HCL 50 MG/ML IJ SOLN
25.0000 mg | Freq: Once | INTRAMUSCULAR | Status: AC
  Administered 2021-08-18: 25 mg via INTRAVENOUS
  Filled 2021-08-18: qty 1

## 2021-08-18 MED ORDER — HEPARIN SOD (PORK) LOCK FLUSH 100 UNIT/ML IV SOLN
INTRAVENOUS | Status: AC
  Administered 2021-08-18: 500 [IU] via INTRAVENOUS
  Filled 2021-08-18: qty 5

## 2021-08-18 MED ORDER — SODIUM CHLORIDE 0.9 % IV SOLN
10.0000 mg | Freq: Once | INTRAVENOUS | Status: AC
  Administered 2021-08-18: 10 mg via INTRAVENOUS
  Filled 2021-08-18: qty 10

## 2021-08-18 MED ORDER — SODIUM CHLORIDE 0.9 % IV SOLN
Freq: Once | INTRAVENOUS | Status: AC
  Filled 2021-08-18: qty 250

## 2021-08-18 NOTE — Progress Notes (Signed)
Pt c/o intermittent left ankle swelling. PT has been increasing protein intake. Pt endorses "sniffle" x3-4 weeks.

## 2021-08-18 NOTE — Patient Instructions (Signed)
[facility]  Discharge Instructions: Thank you for choosing [facility] to provide your oncology and hematology care.  If you have a lab appointment with the Cancer Center, please go directly to the Cancer Center and check in at the registration area.  Wear comfortable clothing and clothing appropriate for easy access to any Portacath or PICC line.   We strive to give you quality time with your provider. You may need to reschedule your appointment if you arrive late (15 or more minutes).  Arriving late affects you and other patients whose appointments are after yours.  Also, if you miss three or more appointments without notifying the office, you may be dismissed from the clinic at the provider's discretion.      For prescription refill requests, have your pharmacy contact our office and allow 72 hours for refills to be completed.    Today you received the following chemotherapy and/or immunotherapy agents Taxol        To help prevent nausea and vomiting after your treatment, we encourage you to take your nausea medication as directed.  BELOW ARE SYMPTOMS THAT SHOULD BE REPORTED IMMEDIATELY: *FEVER GREATER THAN 100.4 F (38 C) OR HIGHER *CHILLS OR SWEATING *NAUSEA AND VOMITING THAT IS NOT CONTROLLED WITH YOUR NAUSEA MEDICATION *UNUSUAL SHORTNESS OF BREATH *UNUSUAL BRUISING OR BLEEDING *URINARY PROBLEMS (pain or burning when urinating, or frequent urination) *BOWEL PROBLEMS (unusual diarrhea, constipation, pain near the anus) TENDERNESS IN MOUTH AND THROAT WITH OR WITHOUT PRESENCE OF ULCERS (sore throat, sores in mouth, or a toothache) UNUSUAL RASH, SWELLING OR PAIN  UNUSUAL VAGINAL DISCHARGE OR ITCHING   Items with * indicate a potential emergency and should be followed up as soon as possible or go to the Emergency Department if any problems should occur.  Please show the CHEMOTHERAPY ALERT CARD or IMMUNOTHERAPY ALERT CARD at check-in to  the Emergency Department and triage nurse.  Should you have questions after your visit or need to cancel or reschedule your appointment, please contact [facility]  [phone] and follow the prompts.  Office hours are 8:00 a.m. to 4:30 p.m. Monday - Friday. Please note that voicemails left after 4:00 p.m. may not be returned until the following business day.  We are closed weekends and major holidays. You have access to a nurse at all times for urgent questions. Please call the main number to the clinic [phone] and follow the prompts.  For any non-urgent questions, you may also contact your provider using MyChart. We now offer e-Visits for anyone 18 and older to request care online for non-urgent symptoms. For details visit [email or web address].   Also download the MyChart app! Go to the app store, search "MyChart", open the app, select [facility], and log in with your MyChart username and password.  Due to Covid, a mask is required upon entering the hospital/clinic. If you do not have a mask, one will be given to you upon arrival. For doctor visits, patients may have 1 support person aged 18 or older with them. For treatment visits, patients cannot have anyone with them due to current Covid guidelines and our immunocompromised population.  

## 2021-08-18 NOTE — Progress Notes (Signed)
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  female with PMH listed below was seen in consultation at the request of  Gayland Curry, MD  for evaluation of tumor in lumbar vertebrae.  # patient reports feeling tightness and pain in Feb 2022. She was started on conservative management which did not help her symptoms. She also started to experience numbness.  She takes gabapentin which partially relieve the discomfort. She denies bowel or bladder incontinence, no lower extremity weakness.   06/25/2021 MRI lumbar spine IMPRESSION:  1. 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 Additional metastatic involvement T12, L1 through L3.  No primary tumor site identified. Top differential considerations are Metastatic Disease Unknown primary, less likely Lymphoma or Multiple Myeloma.   2. Superimposed lumbar spine degeneration, including degenerative moderate to severe left L3 and L4 nerve level impingement from disc herniation.    Patient was seen by neurosurgeon Dr.Yarbrough today. He has ordered MRI cervical and thoracic spine and sacrum biopsy for further work up.   Patient has a history of breast cancer, diagnosed in 2010 Left-sided T2 (2.4cm) N0, ER/PR positive, HER2 negative IDC of the breast, s/p lumpectomy by Dr.Meyer at [facility]. Oncotype score of 11 who completed radiation and she finished  10 years of [employer] from 05/2009 and stopped in 2020.  she has intentionally lost some weight, no night sweating, fever.   # 07/02/2021, CT chest abdomen pelvis showed innumerable small pulmonary and pleural nodules consistent with diffuse metastasis.  Associated with probable malignant pleural effusion.  Mediastinal and hilar lymphadenopathy consistent with metastatic disease.  Hepatic and bilateral adrenal gland metastasis.  Diffuse extensive destructive metastatic bone disease involving pelvis.  07/11/2021 - 07/14/2021  Patient was admitted to hospital due to shortness of breath. 07/12/2021, CT chest PE protocol showed no PE.  Further increase of March pleural effusion and admit to moderate interstitial pulmonary edema.  Bulky mediastinal and hilar lymphadenopathy.  Unchanged.  Incompletely visualized left adrenal nodule. 07/12/2021 patient underwent right thoracentesis.  Cytology was positive for metastatic carcinoma, compatible with breast origin.  ER/PR/HER2 status is pending.  07/16/2021, patient underwent iliac bone biopsy.  Positive for metastatic carcinoma. 07/25/2021, patient underwent thoracentesis of right side, removed 550 cc straw-colored pleural fluid.  She tolerated procedure well.  Today she reports shortness of breath with exertion has slightly improved.  She uses home oxygen 2 L  INTERVAL HISTORY: Amber Blevins is a 70 y.o. female who has above history reviewed by me today presents for follow up visit for management of metastatic breast cancer. She has noticed some swelling of lower extremities; Left more than right. Has been increasing protein intake and elevating legs. Not painful. Pain is stable.   Review of Systems  Constitutional:  Negative for appetite change, chills, fatigue and fever.  HENT:   Negative for hearing loss and voice change.   Eyes:  Negative for eye problems.  Respiratory:  Negative for chest tightness and cough.   Cardiovascular:  Positive for leg  swelling. Negative for chest pain.  Gastrointestinal:  Negative for abdominal distention, abdominal pain and blood in stool.  Endocrine: Negative for hot flashes.  Genitourinary:  Negative for difficulty urinating and frequency.   Musculoskeletal:  Positive for back pain. Negative for arthralgias.  Skin:  Negative for itching and rash.  Neurological:  Negative for extremity weakness.  Hematological:  Negative for adenopathy.  Psychiatric/Behavioral:  Negative for confusion and depression. The patient is not nervous/anxious.    MEDICAL HISTORY:  Past Medical History:  Diagnosis Date   Breast cancer ([place])    COPD (chronic obstructive pulmonary disease) ([place])    Family history of adverse reaction to anesthesia    brother has problem with coming out of anesthesia   High cholesterol    Hypertension    Personal history of radiation therapy    Pre-diabetes    SURGICAL HISTORY: Past Surgical History:  Procedure Laterality Date   BREAST LUMPECTOMY Left    2010   COLONOSCOPY     EYE SURGERY Left    Cataract surgery   PORTACATH PLACEMENT N/A 08/01/2021   Procedure: INSERTION PORT-A-CATH;  Surgeon: Herbert Pun, MD;  Location: [facility] ORS;  Service: General;  Laterality: N/A;   thorocentesis Right 07/10/2021   and another on 07/25/21   SOCIAL HISTORY: Social History   Socioeconomic History   Marital status: Married    Spouse name: Not on file   Number of children: Not on file   Years of education: Not on file   Highest education level: Not on file  Occupational History   Not on file  Tobacco Use   Smoking status: Former    Packs/day: 1.00    Years: 25.00    Pack years: 25.00    Types: Cigarettes    Quit date: 72    Years since quitting: 32.9   Smokeless tobacco: Never  Vaping Use   Vaping Use: Never used  Substance and Sexual Activity   Alcohol use: Not Currently   Drug use: Never   Sexual activity: Not on file  Other Topics Concern   Not on file  Social  History Narrative   Not on file   Social Determinants of Health   Financial Resource Strain: Not on file  Food Insecurity: Not on file  Transportation Needs: Not on file  Physical Activity: Not on file  Stress: Not on file  Social Connections: Not on file  Intimate Partner Violence: Not on file   FAMILY HISTORY: Family History  Problem Relation Age of Onset   Cancer Mother        gynecological   Lung cancer Mother    Diabetes Father    Heart disease Father    Parkinson's disease Father    Brain cancer Brother    Bladder Cancer Brother    Pulmonary disease Brother    Rheumatic fever Brother    Breast cancer Neg Hx    ALLERGIES:  is allergic to green tea (camellia sinensis), melaleuca viridiflora, and lisinopril.  MEDICATIONS:  Current Outpatient Medications  Medication Sig Dispense Refill   acetaminophen (TYLENOL) 650 MG CR tablet Take 650 mg by mouth every 8 (eight) hours as needed for pain.     albuterol (VENTOLIN HFA) 108 (90 Base) MCG/ACT inhaler Inhale 2 puffs into the lungs every 6 (six) hours as needed.     aspirin 81 MG EC tablet Take by mouth.     atorvastatin (LIPITOR) 40 MG tablet Take 40 mg by mouth at bedtime.  Boswellia-Glucosamine-Vit D (OSTEO BI-FLEX-GLUCOS/5-LOXIN) TABS Take 1 capsule by mouth in the morning and at bedtime.     Calcium Carb-Cholecalciferol 600-400 MG-UNIT TABS Take 1 tablet by mouth daily.     dexamethasone (DECADRON) 1 MG tablet Take 1 tablet (1 mg total) by mouth 2 (two) times daily with a meal. 7 tablet 0   Docusate Sodium (DSS) 100 MG CAPS Take 1 capsule by mouth every other day.     Fluticasone-Umeclidin-Vilant (TRELEGY ELLIPTA) 100-62.5-25 MCG/INH AEPB Inhale into the lungs. Inhale 1 inhalation into the lungs once daily     gabapentin (NEURONTIN) 300 MG capsule Take 2 capsules by mouth in the morning, at noon, and at bedtime.     ibuprofen (ADVIL) 200 MG tablet Take by mouth. Take 400 mg by mouth every six (6) hours as needed for  pain.     lidocaine-prilocaine (EMLA) cream Apply 1 application topically as needed. 30 g 5   Loperamide HCl (IMODIUM PO) Take by mouth.     LORazepam (ATIVAN) 0.5 MG tablet Take 1 tablet (0.5 mg total) by mouth at bedtime as needed for anxiety. 30 tablet 0   magnesium chloride (SLOW-MAG) 64 MG TBEC SR tablet Take 1 tablet by mouth at bedtime.     metoprolol-hydrochlorothiazide (LOPRESSOR HCT) 50-25 MG tablet Take 1 tablet by mouth daily. Take 1 tablet by mouth once daily     ondansetron (ZOFRAN) 8 MG tablet Take 1 tablet (8 mg total) by mouth 2 (two) times daily as needed (Nausea or vomiting). 30 tablet 1   oxyCODONE (OXY IR/ROXICODONE) 5 MG immediate release tablet Take 1 tablet (5 mg total) by mouth every 12 (twelve) hours as needed for severe pain or moderate pain. 60 tablet 0   potassium chloride SA (KLOR-CON) 20 MEQ tablet Take 1 tablet (20 mEq total) by mouth daily. 30 tablet 0   prochlorperazine (COMPAZINE) 10 MG tablet Take 1 tablet (10 mg total) by mouth every 6 (six) hours as needed (Nausea or vomiting). 30 tablet 1   Turmeric (QC TUMERIC COMPLEX PO) Take 1 capsule by mouth at bedtime.     No current facility-administered medications for this visit.   Facility-Administered Medications Ordered in Other Visits  Medication Dose Route Frequency Provider Last Rate Last Admin   heparin lock flush 100 unit/mL  500 Units Intravenous Once [name], MD       sodium chloride flush (NS) 0.9 % injection 10 mL  10 mL Intravenous PRN [name], MD   10 mL at 08/18/21 0820   PHYSICAL EXAMINATION: ECOG PERFORMANCE STATUS: 1 - Symptomatic but completely ambulatory Vitals:   08/18/21 0835  BP: 121/70  Pulse: 78  Resp: 18  Temp: 98.1 F (36.7 C)  SpO2: 98%   Filed Weights   08/18/21 0835  Weight: 178 lb 4.8 oz (80.9 kg)   Physical Exam Constitutional:      General: She is not in acute distress.    Comments: accompanied  HENT:     Head: Normocephalic and atraumatic.  Eyes:     General:  No scleral icterus. Cardiovascular:     Rate and Rhythm: Normal rate and regular rhythm.     Heart sounds: Normal heart sounds.  Pulmonary:     Effort: Pulmonary effort is normal. No respiratory distress.     Breath sounds: No wheezing.  Abdominal:     General: Bowel sounds are normal. There is no distension.     Palpations: Abdomen is soft.  Musculoskeletal:  General: No tenderness or deformity.     Cervical back: Normal range of motion and neck supple.     Right lower leg: Edema (1+) present.     Left lower leg: Edema (2+) present.  Skin:    General: Skin is warm and dry.     Findings: No erythema or rash.  Neurological:     Mental Status: She is alert and oriented to person, place, and time. Mental status is at baseline.     Cranial Nerves: No cranial nerve deficit.     Coordination: Coordination normal.  Psychiatric:        Mood and Affect: Mood normal.        Behavior: Behavior normal.   LABORATORY DATA:  I have reviewed the data as listed Lab Results  Component Value Date   WBC 4.1 08/18/2021   HGB 11.6 (L) 08/18/2021   HCT 35.3 (L) 08/18/2021   MCV 87.4 08/18/2021   PLT 279 08/18/2021   Recent Labs    08/04/21 0748 08/11/21 0825 08/18/21 0820  NA 131* 133* 136  K 3.3* 3.4* 3.7  CL 93* 96* 99  CO2 30 31 28   GLUCOSE 107* 111* 104*  BUN 23 25* 27*  CREATININE 0.65 0.54 0.66  CALCIUM 8.4* 8.1* 8.2*  GFRNONAA >60 >60 >60  PROT 6.2* 5.6* 5.9*  ALBUMIN 3.5 3.3* 3.5  AST 24 24 25   ALT 21 21 26   ALKPHOS 96 93 96  BILITOT 1.1 0.9 0.6  Iron/TIBC/Ferritin/ %Sat No results found for: IRON, TIBC, FERRITIN, IRONPCTSAT   RADIOGRAPHIC STUDIES: I have personally reviewed the radiological images as listed and agreed with the findings in the report. MR Brain W Wo Contrast  Result Date: 07/28/2021 CLINICAL DATA:  Breast cancer. EXAM: MRI HEAD WITHOUT AND WITH CONTRAST TECHNIQUE: Multiplanar, multiecho pulse sequences of the brain and surrounding structures were  obtained without and with intravenous contrast. CONTRAST:  60m GADAVIST GADOBUTROL 1 MMOL/ML IV SOLN Adequate IV access could not be obtained despite assistance from the IV team. A portion of the planned contrast volume was able to be injected through a butterfly needle before the vein ruptured. COMPARISON:  None. FINDINGS: Brain: There is no evidence of an acute infarct, intracranial hemorrhage, mass, midline shift, or extra-axial fluid collection. The ventricles and sulci are normal. A few small T2 hyperintensities in the cerebral white matter and pons are nonspecific but compatible with minimal chronic small vessel ischemic disease. No enhancing brain lesions are identified, however sensitivity for detection of small lesions is reduced by the incomplete contrast injection. Vascular: Major intracranial vascular flow voids are preserved. Skull and upper cervical spine: Unremarkable bone marrow signal. Sinuses/Orbits: Left cataract extraction. Mucous retention cyst in the left maxillary sinus. Trace left mastoid effusion. Suspected 1 cm epidermal inclusion cyst in the right cheek region. Other: None. IMPRESSION: 1. No evidence of intracranial metastases within above detailed study limitations. 2. Minimal chronic small vessel ischemic disease. Electronically Signed   By: ALogan BoresM.D.   On: 07/28/2021 17:00   MR CERVICAL SPINE W WO CONTRAST  Result Date: 07/25/2021 CLINICAL DATA:  Initial evaluation for metastatic disease. History of breast cancer. EXAM: MRI CERVICAL AND THORACIC SPINE WITHOUT AND WITH CONTRAST TECHNIQUE: Multiplanar and multiecho pulse sequences of the cervical spine, to include the craniocervical junction and cervicothoracic junction, and the thoracic spine, were obtained without and with intravenous contrast. CONTRAST:  833mGADAVIST GADOBUTROL 1 MMOL/ML IV SOLN COMPARISON:  None available. FINDINGS: MRI CERVICAL SPINE FINDINGS  Alignment: Straightening of the normal cervical lordosis. No  listhesis. Vertebrae: T1 hypointense, stir hyperintense enhancing lesion largely replacing the C6 vertebral body, consistent with an osseous metastatic implant. An additional separate implant seen involving the C6 spinous process (series 35, image 7). No other metastatic disease within the cervical spine itself, although note is made of additional metastatic implants involving the upper thoracic spine at the levels of T2 and T3, described on corresponding thoracic spine portion of this report. No pathologic fracture or significant extra osseous extension of tumor. Bone marrow signal intensity otherwise normal. Vertebral body height maintained. No other abnormal marrow edema or enhancement. Cord: Normal signal and morphology. No epidural or intramedullary tumor. No abnormal enhancement. Posterior Fossa, vertebral arteries, paraspinal tissues: Visualized brain and posterior fossa within normal limits. Craniocervical junction normal. Paraspinous soft tissues within normal limits. Normal flow voids seen within the vertebral arteries bilaterally. Layering bilateral pleural effusions partially visualized. Disc levels: C2-C3: Mild uncovertebral spurring without significant disc bulge. Mild facet hypertrophy on the right. No stenosis. C3-C4: Disc bulge with endplate and uncovertebral spurring. Bilateral facet hypertrophy. Mild to moderate spinal stenosis with moderate left worse than right C4 foraminal narrowing. C4-C5: Disc bulge with endplate and uncovertebral spurring, worse on the right. Right-sided facet hypertrophy. Mild canal with moderate right C5 foraminal stenosis. C5-C6: Mild disc bulge with uncovertebral spurring. Mild to moderate bilateral C6 foraminal narrowing, worse on the right. No spinal stenosis. C6-C7: Disc bulge with bilateral uncovertebral hypertrophy. Flattening of the ventral thecal sac with borderline mild spinal stenosis. Moderate to severe bilateral C7 foraminal narrowing, worse on the left.  C7-T1:  Negative. MRI THORACIC SPINE FINDINGS Alignment: Straightening of the normal midthoracic kyphosis. No listhesis. Vertebrae: Multiple scattered osseous metastases seen throughout the thoracic spine. There is involvement of most levels, with most prominent lesions seen involving the T3, T5, T6, T7, and T10 vertebral bodies. Associated pathologic fracture at the inferior endplate of T7 with mild height loss but no bony retropulsion. Additional mild concavity about the T3 and T10 vertebral bodies also suspicious for associated compression fractures. Mild extra osseous extension about a lesion involving the spinous process of T2 (series 34, image 8). Probable early epidural extension into the right neural foramina of T2-3 (series 27, image 6) and T9-10 (series 27, image 4). No other significant extra osseous or epidural extension at this time. Cord: Normal signal and morphology. Probable early epidural extension into the right neural foramina at T2-3 and T9-10 as above. No other significant epidural involvement at this time. No intramedullary tumor or abnormal enhancement. Paraspinal and other soft tissues: 2.1 cm soft tissue implant noted within the left posterior paraspinous soft tissues of the upper back at the level of T1-2 (series 34, image 15). Additional probable small soft tissue implant within the left posterior soft tissues at the level of T12 (series 34, image 14). Large layering bilateral pleural effusions noted. Disc levels: Underlying mild for age spondylosis. No significant disc bulge or focal disc herniation. No significant stenosis. IMPRESSION: MRI CERVICAL SPINE IMPRESSION: 1. Osseous metastatic disease involving the C6 vertebral body and C6 spinous process. No other evidence for metastatic disease within the cervical spine. No extra osseous extension or epidural involvement. 2. Underlying cervical spondylosis with resultant mild to moderate spinal stenosis at C3-4 and C4-5. Moderate bilateral  C4 and right C5 foraminal narrowing, with moderate to severe bilateral C7 foraminal stenosis. MRI THORACIC SPINE IMPRESSION: 1. Diffuse osseous metastatic disease throughout the thoracic spine as detailed above. Associated pathologic  compression fracture of T7, with additional probable mild compression fractures of T3 and T10. 2. Mild/early epidural extension into the right neural foramina at T2-3 and T9-10. No other significant epidural tumor at this time. 3. Extra osseous extension tumor about a lesion involving the T2 spinous process. 4. Few soft tissue implants involving the left posterior paraspinous soft tissues at the levels of T1-2 and T12 as above. 5. Large layering bilateral pleural effusions. Electronically Signed   By: Jeannine Boga M.D.   On: 07/25/2021 05:23   MR THORACIC SPINE W WO CONTRAST  Result Date: 07/25/2021 CLINICAL DATA:  Initial evaluation for metastatic disease. History of breast cancer. EXAM: MRI CERVICAL AND THORACIC SPINE WITHOUT AND WITH CONTRAST TECHNIQUE: Multiplanar and multiecho pulse sequences of the cervical spine, to include the craniocervical junction and cervicothoracic junction, and the thoracic spine, were obtained without and with intravenous contrast. CONTRAST:  67m GADAVIST GADOBUTROL 1 MMOL/ML IV SOLN COMPARISON:  None available. FINDINGS: MRI CERVICAL SPINE FINDINGS Alignment: Straightening of the normal cervical lordosis. No listhesis. Vertebrae: T1 hypointense, stir hyperintense enhancing lesion largely replacing the C6 vertebral body, consistent with an osseous metastatic implant. An additional separate implant seen involving the C6 spinous process (series 35, image 7). No other metastatic disease within the cervical spine itself, although note is made of additional metastatic implants involving the upper thoracic spine at the levels of T2 and T3, described on corresponding thoracic spine portion of this report. No pathologic fracture or significant extra  osseous extension of tumor. Bone marrow signal intensity otherwise normal. Vertebral body height maintained. No other abnormal marrow edema or enhancement. Cord: Normal signal and morphology. No epidural or intramedullary tumor. No abnormal enhancement. Posterior Fossa, vertebral arteries, paraspinal tissues: Visualized brain and posterior fossa within normal limits. Craniocervical junction normal. Paraspinous soft tissues within normal limits. Normal flow voids seen within the vertebral arteries bilaterally. Layering bilateral pleural effusions partially visualized. Disc levels: C2-C3: Mild uncovertebral spurring without significant disc bulge. Mild facet hypertrophy on the right. No stenosis. C3-C4: Disc bulge with endplate and uncovertebral spurring. Bilateral facet hypertrophy. Mild to moderate spinal stenosis with moderate left worse than right C4 foraminal narrowing. C4-C5: Disc bulge with endplate and uncovertebral spurring, worse on the right. Right-sided facet hypertrophy. Mild canal with moderate right C5 foraminal stenosis. C5-C6: Mild disc bulge with uncovertebral spurring. Mild to moderate bilateral C6 foraminal narrowing, worse on the right. No spinal stenosis. C6-C7: Disc bulge with bilateral uncovertebral hypertrophy. Flattening of the ventral thecal sac with borderline mild spinal stenosis. Moderate to severe bilateral C7 foraminal narrowing, worse on the left. C7-T1:  Negative. MRI THORACIC SPINE FINDINGS Alignment: Straightening of the normal midthoracic kyphosis. No listhesis. Vertebrae: Multiple scattered osseous metastases seen throughout the thoracic spine. There is involvement of most levels, with most prominent lesions seen involving the T3, T5, T6, T7, and T10 vertebral bodies. Associated pathologic fracture at the inferior endplate of T7 with mild height loss but no bony retropulsion. Additional mild concavity about the T3 and T10 vertebral bodies also suspicious for associated compression  fractures. Mild extra osseous extension about a lesion involving the spinous process of T2 (series 34, image 8). Probable early epidural extension into the right neural foramina of T2-3 (series 27, image 6) and T9-10 (series 27, image 4). No other significant extra osseous or epidural extension at this time. Cord: Normal signal and morphology. Probable early epidural extension into the right neural foramina at T2-3 and T9-10 as above. No other significant epidural involvement  at this time. No intramedullary tumor or abnormal enhancement. Paraspinal and other soft tissues: 2.1 cm soft tissue implant noted within the left posterior paraspinous soft tissues of the upper back at the level of T1-2 (series 34, image 15). Additional probable small soft tissue implant within the left posterior soft tissues at the level of T12 (series 34, image 14). Large layering bilateral pleural effusions noted. Disc levels: Underlying mild for age spondylosis. No significant disc bulge or focal disc herniation. No significant stenosis. IMPRESSION: MRI CERVICAL SPINE IMPRESSION: 1. Osseous metastatic disease involving the C6 vertebral body and C6 spinous process. No other evidence for metastatic disease within the cervical spine. No extra osseous extension or epidural involvement. 2. Underlying cervical spondylosis with resultant mild to moderate spinal stenosis at C3-4 and C4-5. Moderate bilateral C4 and right C5 foraminal narrowing, with moderate to severe bilateral C7 foraminal stenosis. MRI THORACIC SPINE IMPRESSION: 1. Diffuse osseous metastatic disease throughout the thoracic spine as detailed above. Associated pathologic compression fracture of T7, with additional probable mild compression fractures of T3 and T10. 2. Mild/early epidural extension into the right neural foramina at T2-3 and T9-10. No other significant epidural tumor at this time. 3. Extra osseous extension tumor about a lesion involving the T2 spinous process. 4. Few  soft tissue implants involving the left posterior paraspinous soft tissues at the levels of T1-2 and T12 as above. 5. Large layering bilateral pleural effusions. Electronically Signed   By: Jeannine Boga M.D.   On: 07/25/2021 05:23   DG Chest Port 1 View  Result Date: 08/01/2021 CLINICAL DATA:  Port-A-Cath placement.  Breast carcinoma. EXAM: PORTABLE CHEST 1 VIEW COMPARISON:  07/25/2021 FINDINGS: Right-sided Port-A-Cath is now seen with tip overlying the superior cavoatrial junction. No evidence of pneumothorax. Diffuse interstitial infiltrates are again seen, without significant change. Probable small right pleural effusion is also stable. Heart size is within normal limits. Aortic atherosclerotic calcification noted. IMPRESSION: Right-sided Port-A-Cath in appropriate position. No pneumothorax visualized. Stable diffuse interstitial infiltrates and probable small right pleural effusion. Electronically Signed   By: Marlaine Hind M.D.   On: 08/01/2021 17:14   DG Chest Port 1 View  Result Date: 07/25/2021 CLINICAL DATA:  Status post right-sided thoracentesis, remote history of breast cancer EXAM: PORTABLE CHEST 1 VIEW COMPARISON:  07/12/2021 FINDINGS: Single frontal view of the chest demonstrates a stable cardiac silhouette. There are trace bilateral pleural effusions. No evidence of pneumothorax. Bronchovascular prominence with patchy bibasilar consolidation unchanged. No acute bony abnormalities. IMPRESSION: 1. Trace bilateral pleural effusions. 2. No evidence of pneumothorax. 3. Stable bronchovascular prominence and bibasilar consolidation. Electronically Signed   By: Randa Ngo M.D.   On: 07/25/2021 16:17   DG C-Arm 1-60 Min-No Report  Result Date: 08/01/2021 Fluoroscopy was utilized by the requesting physician.  No radiographic interpretation.   US THORACENTESIS ASP PLEURAL SPACE W/IMG GUIDE  Result Date: 07/25/2021 INDICATION: SOB EXAM: ULTRASOUND GUIDED RIGHT THORACENTESIS  MEDICATIONS: None. COMPLICATIONS: None immediate. PROCEDURE: An ultrasound guided thoracentesis was thoroughly discussed with the patient and questions answered. The benefits, risks, alternatives and complications were also discussed. The patient understands and wishes to proceed with the procedure. Written consent was obtained. Ultrasound was performed to localize and mark an adequate pocket of fluid in the right chest. The area was then prepped and draped in the normal sterile fashion. 1% Lidocaine was used for local anesthesia. Under ultrasound guidance a 19 gauge Yueh catheter was introduced. Thoracentesis was performed. The catheter was removed and a dressing applied.  FINDINGS: A total of approximately 550 mL of clear straw-colored fluid was removed. IMPRESSION: Successful ultrasound guided right thoracentesis yielding 550 mL of clear straw-colored pleural fluid. Electronically Signed   By: Albin Felling M.D.   On: 07/25/2021 16:57    ASSESSMENT & PLAN:  No diagnosis found.  Metastatic Breast Cancer- extensive thoracic and bone involvement in visceral crisis; ER+, PR+, HER2 negative. MRI brain negative. Labs reviewed and acceptable for treatment. Continue taxol today.  Neoplasm related pain- Continue oxycodone 57m Q12h PRN for pain. Symptom is well controlled.  Leg swelling- prescription for graduated compression stockings provided. 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 Additional radiation planned with Dr CBaruch Gouty She has tapered dexamethasone. Consider bisphosphonate.  Hypoalbuminemia- increasing protein intake and physical activity Anxiety & insomnia- on ativan  Follow up with Dr YTasia Catchingsas scheduled or return to clinic sooner if needed  All questions were answered. The patient knows to call the clinic with any problems questions or concerns.  LBeckey Rutter DNP, AGNP-C [phone](clinic) 08/18/2021

## 2021-08-19 ENCOUNTER — Ambulatory Visit
Admission: RE | Admit: 2021-08-19 | Discharge: 2021-08-19 | Disposition: A | Discharge status: Home / Self Care | Payer: Medicare PPO | Source: Ambulatory Visit | Attending: Radiation Oncology | Admitting: Radiation Oncology

## 2021-08-19 DIAGNOSIS — Z5111 Encounter for antineoplastic chemotherapy: Secondary | ICD-10-CM | POA: Diagnosis not present

## 2021-08-20 ENCOUNTER — Ambulatory Visit
Admission: RE | Admit: 2021-08-20 | Discharge: 2021-08-20 | Disposition: A | Discharge status: Home / Self Care | Payer: Medicare PPO | Source: Ambulatory Visit | Attending: Radiation Oncology | Admitting: Radiation Oncology

## 2021-08-20 ENCOUNTER — Other Ambulatory Visit: Payer: Self-pay | Admitting: *Deleted

## 2021-08-20 DIAGNOSIS — Z5111 Encounter for antineoplastic chemotherapy: Secondary | ICD-10-CM | POA: Diagnosis not present

## 2021-08-20 MED ORDER — SUCRALFATE 1 GM/10ML PO SUSP: 1.0000 g | Freq: Three times a day (TID) | ORAL | 2 refills | Status: DC

## 2021-08-25 ENCOUNTER — Ambulatory Visit
Admission: RE | Admit: 2021-08-25 | Discharge: 2021-08-25 | Disposition: A | Discharge status: Home / Self Care | Payer: Medicare PPO | Source: Ambulatory Visit | Attending: Radiation Oncology | Admitting: Radiation Oncology

## 2021-08-25 DIAGNOSIS — Z5111 Encounter for antineoplastic chemotherapy: Secondary | ICD-10-CM | POA: Diagnosis not present

## 2021-08-26 ENCOUNTER — Inpatient Hospital Stay: Payer: Medicare PPO

## 2021-08-26 ENCOUNTER — Encounter: Payer: Self-pay | Admitting: Oncology

## 2021-08-26 ENCOUNTER — Other Ambulatory Visit: Payer: Self-pay

## 2021-08-26 ENCOUNTER — Inpatient Hospital Stay: Payer: Medicare PPO | Admitting: Oncology

## 2021-08-26 ENCOUNTER — Ambulatory Visit
Admission: RE | Admit: 2021-08-26 | Discharge: 2021-08-26 | Disposition: A | Discharge status: Home / Self Care | Payer: Medicare PPO | Source: Ambulatory Visit | Attending: Radiation Oncology | Admitting: Radiation Oncology

## 2021-08-26 VITALS — BP 109/78 | HR 86 | Temp 97.9°F | Resp 18 | Wt 179.3 lb

## 2021-08-26 DIAGNOSIS — C50919 Malignant neoplasm of unspecified site of unspecified female breast: Secondary | ICD-10-CM

## 2021-08-26 DIAGNOSIS — J90 Pleural effusion, not elsewhere classified: Secondary | ICD-10-CM

## 2021-08-26 DIAGNOSIS — G893 Neoplasm related pain (acute) (chronic): Secondary | ICD-10-CM | POA: Diagnosis not present

## 2021-08-26 DIAGNOSIS — C801 Malignant (primary) neoplasm, unspecified: Secondary | ICD-10-CM

## 2021-08-26 DIAGNOSIS — C7951 Secondary malignant neoplasm of bone: Secondary | ICD-10-CM | POA: Diagnosis not present

## 2021-08-26 DIAGNOSIS — M7989 Other specified soft tissue disorders: Secondary | ICD-10-CM

## 2021-08-26 DIAGNOSIS — Z5111 Encounter for antineoplastic chemotherapy: Secondary | ICD-10-CM | POA: Diagnosis not present

## 2021-08-26 LAB — COMPREHENSIVE METABOLIC PANEL
ALT: 26 U/L (ref 0–44)
AST: 26 U/L (ref 15–41)
Albumin: 3.6 g/dL (ref 3.5–5.0)
Alkaline Phosphatase: 102 U/L (ref 38–126)
Anion gap: 5 (ref 5–15)
BUN: 20 mg/dL (ref 8–23)
CO2: 30 mmol/L (ref 22–32)
Calcium: 8.6 mg/dL — ABNORMAL LOW (ref 8.9–10.3)
Chloride: 101 mmol/L (ref 98–111)
Creatinine, Ser: 0.55 mg/dL (ref 0.44–1.00)
GFR, Estimated: >60 mL/min (ref >60)
Glucose, Bld: 105 mg/dL — ABNORMAL HIGH (ref 70–99)
Potassium: 3.4 mmol/L — ABNORMAL LOW (ref 3.5–5.1)
Sodium: 136 mmol/L (ref 135–145)
Total Bilirubin: 0.9 mg/dL (ref 0.3–1.2)
Total Protein: 6.3 g/dL — ABNORMAL LOW (ref 6.5–8.1)

## 2021-08-26 LAB — CBC WITH DIFFERENTIAL/PLATELET
Abs Immature Granulocytes: 0.17 10*3/uL — ABNORMAL HIGH (ref 0.00–0.07)
Basophils Absolute: 0 10*3/uL (ref 0.0–0.1)
Basophils Relative: 1 %
Eosinophils Absolute: 0 10*3/uL (ref 0.0–0.5)
Eosinophils Relative: 1 %
HCT: 34.8 % — ABNORMAL LOW (ref 36.0–46.0)
Hemoglobin: 11.3 g/dL — ABNORMAL LOW (ref 12.0–15.0)
Immature Granulocytes: 4 %
Lymphocytes Relative: 10 %
Lymphs Abs: 0.4 10*3/uL — ABNORMAL LOW (ref 0.7–4.0)
MCH: 28.5 pg (ref 26.0–34.0)
MCHC: 32.5 g/dL (ref 30.0–36.0)
MCV: 87.9 fL (ref 80.0–100.0)
Monocytes Absolute: 0.5 10*3/uL (ref 0.1–1.0)
Monocytes Relative: 12 %
Neutro Abs: 3 10*3/uL (ref 1.7–7.7)
Neutrophils Relative %: 72 %
Platelets: 271 10*3/uL (ref 150–400)
RBC: 3.96 MIL/uL (ref 3.87–5.11)
RDW: 17.5 % — ABNORMAL HIGH (ref 11.5–15.5)
WBC: 4.2 10*3/uL (ref 4.0–10.5)
nRBC: 2.2 % — ABNORMAL HIGH (ref 0.0–0.2)

## 2021-08-26 MED ORDER — HEPARIN SOD (PORK) LOCK FLUSH 100 UNIT/ML IV SOLN
500.0000 [IU] | Freq: Once | INTRAVENOUS | Status: AC | PRN
  Administered 2021-08-26: 500 [IU]
  Filled 2021-08-26: qty 5

## 2021-08-26 MED ORDER — SODIUM CHLORIDE 0.9 % IV SOLN
10.0000 mg | Freq: Once | INTRAVENOUS | Status: AC
  Administered 2021-08-26: 10 mg via INTRAVENOUS
  Filled 2021-08-26: qty 10

## 2021-08-26 MED ORDER — SODIUM CHLORIDE 0.9 % IV SOLN
80.0000 mg/m2 | Freq: Once | INTRAVENOUS | Status: AC
  Administered 2021-08-26: 156 mg via INTRAVENOUS
  Filled 2021-08-26: qty 26

## 2021-08-26 MED ORDER — DIPHENHYDRAMINE HCL 50 MG/ML IJ SOLN
25.0000 mg | Freq: Once | INTRAMUSCULAR | Status: AC
  Administered 2021-08-26: 25 mg via INTRAVENOUS

## 2021-08-26 MED ORDER — FAMOTIDINE 20 MG IN NS 100 ML IVPB
20.0000 mg | Freq: Once | INTRAVENOUS | Status: AC
  Administered 2021-08-26: 20 mg via INTRAVENOUS
  Filled 2021-08-26: qty 20

## 2021-08-26 MED ORDER — SODIUM CHLORIDE 0.9 % IV SOLN
Freq: Once | INTRAVENOUS | Status: AC
Start: 2021-08-26 — End: 2021-08-26
  Filled 2021-08-26: qty 250

## 2021-08-26 NOTE — Progress Notes (Signed)
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  female with PMH listed below was seen in consultation at the request of  Gayland Curry, MD  for evaluation of tumor in lumbar vertebrae  # patient reports feeling tightness and pain in Feb 2022. She was started on conservative management which did not help her symptoms. She also started to experience numbness.  She takes gabapentin which partially relieve the discomfort. She denies bowel or bladder incontinence, no lower extremity weakness.   06/25/2021 MRI lumbar spine IMPRESSION:  1. 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 Additional metastatic involvement T12, L1 through L3.  No primary tumor site identified. Top differential considerations are Metastatic Disease Unknown primary, less likely Lymphoma or Multiple Myeloma.   2. Superimposed lumbar spine degeneration, including degenerative moderate to severe left L3 and L4 nerve level impingement from disc herniation.    Patient was seen by neurosurgeon Dr.Yarbrough today. He has ordered MRI cervical and thoracic spine and sacrum biopsy for further work up.   Patient has a history of breast cancer, diagnosed in 2010 Left-sided T2 (2.4cm) N0, ER/PR positive, HER2 negative IDC of the breast, s/p lumpectomy [facility]Meyer [facility]. Oncotype score of 11 who completed radiation and she  finished 10 years of Femara from 05/2009 and stopped in 2020.  she has intentionally lost some weight, no night sweating, fever.   # 07/02/2021, CT chest abdomen pelvis showed innumerable small pulmonary and pleural nodules consistent with diffuse metastasis.  Associated with probable malignant pleural effusion.  Mediastinal and hilar lymphadenopathy consistent with metastatic disease.  Hepatic and bilateral adrenal gland metastasis.  Diffuse extensive destructive metastatic bone disease involving pelvis.   07/11/2021 - 07/14/2021  Patient was admitted to hospital due to shortness of breath. 07/12/2021, CT chest PE protocol showed no PE.  Further increase of March pleural effusion and admit to moderate interstitial pulmonary edema.  Bulky mediastinal and hilar lymphadenopathy.  Unchanged.  Incompletely visualized left adrenal nodule. 07/12/2021 patient underwent right thoracentesis.  Cytology was positive for metastatic carcinoma, compatible with breast origin.  ER/PR/HER2 status is pending.  07/16/2021, patient underwent iliac bone biopsy.  Positive for metastatic carcinoma. 07/25/2021, patient underwent thoracentesis of right side, removed 550 cc straw-colored pleural fluid.  She tolerated procedure well. Today she reports shortness of breath with exertion has slightly improved.  She uses home oxygen 2 L  INTERVAL HISTORY Amber Blevins is a 70 y.o. female who has above history reviewed by me today presents for follow up visit for management of metastatic breast cancer . Breathing is stable.  Off oxygen. Lower back/sacrum pain is improved.  She is on oxycodone every 12 hours, she has not tried to further taper down pain medication regimen. Occasional diarrhea which improved with Imodium.   Minimal numbness tingling of the tip of her hands and feet, patient is on gabapentin 600 mg twice daily. + Left lower extremity edema, slightly improved after leg elevation. Review of Systems   Constitutional:  Negative for appetite change, chills, fatigue and fever.  HENT:   Negative for hearing loss and voice change.   Eyes:  Negative for eye problems.  Respiratory:  Negative for chest tightness and cough.   Cardiovascular:  Negative for chest pain.  Gastrointestinal:  Negative for abdominal distention, abdominal pain and blood in stool.  Endocrine: Negative for hot flashes.  Genitourinary:  Negative for difficulty urinating and frequency.   Musculoskeletal:  Positive for back pain. Negative for arthralgias.       Lower back/sacrum pain and numbness  Skin:  Negative for itching and rash.  Neurological:  Negative for extremity weakness.  Hematological:  Negative for adenopathy.  Psychiatric/Behavioral:  Negative for confusion.    MEDICAL HISTORY:  Past Medical History:  Diagnosis Date   Breast cancer ([place])    COPD (chronic obstructive pulmonary disease) ([place])    Family history of adverse reaction to anesthesia    brother has problem with coming out of anesthesia   High cholesterol    Hypertension    Personal history of radiation therapy    Pre-diabetes     SURGICAL HISTORY: Past Surgical History:  Procedure Laterality Date   BREAST LUMPECTOMY Left    2010   COLONOSCOPY     EYE SURGERY Left    Cataract surgery   PORTACATH PLACEMENT N/A 08/01/2021   Procedure: INSERTION PORT-A-CATH;  Surgeon: Herbert Pun, MD;  Location: [facility] ORS;  Service: General;  Laterality: N/A;   thorocentesis Right 07/10/2021   and another on 07/25/21    SOCIAL HISTORY: Social History   Socioeconomic History   Marital status: Married    Spouse name: Not on file   Number of children: Not on file   Years of education: Not on file   Highest education level: Not on file  Occupational History   Not on file  Tobacco Use   Smoking status: Former    Packs/day: 1.00    Years: 25.00    Pack years: 25.00    Types: Cigarettes    Quit date: 42    Years since quitting: 32.9    Smokeless tobacco: Never  Vaping Use   Vaping Use: Never used  Substance and Sexual Activity   Alcohol use: Not Currently   Drug use: Never   Sexual activity: Not on file  Other Topics Concern   Not on file  Social History Narrative   Not on file   Social Determinants of Health   Financial Resource Strain: Not on file  Food Insecurity: Not on file  Transportation Needs: Not on file  Physical Activity: Not on file  Stress: Not on file  Social Connections: Not on file  Intimate Partner Violence: Not on file    FAMILY HISTORY: Family History  Problem Relation Age of Onset   Cancer Mother        gynecological   Lung cancer Mother    Diabetes Father    Heart disease Father    Parkinson's disease Father    Brain cancer Brother    Bladder Cancer Brother    Pulmonary disease Brother    Rheumatic fever Brother    Breast cancer Neg Hx     ALLERGIES:  is allergic to green tea (camellia sinensis), melaleuca viridiflora, and lisinopril.  MEDICATIONS:  Current Outpatient Medications  Medication Sig Dispense Refill   acetaminophen (TYLENOL) 650 MG CR tablet Take 650 mg by mouth every 8 (eight) hours as needed for pain.     albuterol (VENTOLIN HFA)  108 (90 Base) MCG/ACT inhaler Inhale 2 puffs into the lungs every 6 (six) hours as needed.     aspirin 81 MG EC tablet Take by mouth.     atorvastatin (LIPITOR) 40 MG tablet Take 40 mg by mouth at bedtime.     Boswellia-Glucosamine-Vit D (OSTEO BI-FLEX-GLUCOS/5-LOXIN) TABS Take 1 capsule by mouth in the morning and at bedtime.     Calcium Carb-Cholecalciferol 600-400 MG-UNIT TABS Take 1 tablet by mouth daily.     Fluticasone-Umeclidin-Vilant (TRELEGY ELLIPTA) 100-62.5-25 MCG/INH AEPB Inhale into the lungs. Inhale 1 inhalation into the lungs once daily     gabapentin (NEURONTIN) 300 MG capsule Take 2 capsules by mouth 2 (two) times daily.     ibuprofen (ADVIL) 200 MG tablet Take by mouth. Take 400 mg by mouth every six (6) hours as  needed for pain.     lidocaine-prilocaine (EMLA) cream Apply 1 application topically as needed. 30 g 5   Loperamide HCl (IMODIUM PO) Take by mouth.     LORazepam (ATIVAN) 0.5 MG tablet Take 1 tablet (0.5 mg total) by mouth at bedtime as needed for anxiety. 30 tablet 0   magnesium chloride (SLOW-MAG) 64 MG TBEC SR tablet Take 1 tablet by mouth at bedtime.     metoprolol-hydrochlorothiazide (LOPRESSOR HCT) 50-25 MG tablet Take 1 tablet by mouth daily. Take 1 tablet by mouth once daily     ondansetron (ZOFRAN) 8 MG tablet Take 1 tablet (8 mg total) by mouth 2 (two) times daily as needed (Nausea or vomiting). 30 tablet 1   oxyCODONE (OXY IR/ROXICODONE) 5 MG immediate release tablet Take 1 tablet (5 mg total) by mouth every 12 (twelve) hours as needed for severe pain or moderate pain. 60 tablet 0   potassium chloride SA (KLOR-CON) 20 MEQ tablet Take 1 tablet (20 mEq total) by mouth daily. 30 tablet 0   prochlorperazine (COMPAZINE) 10 MG tablet Take 1 tablet (10 mg total) by mouth every 6 (six) hours as needed (Nausea or vomiting). 30 tablet 1   sucralfate (CARAFATE) 1 GM/10ML suspension Take 10 mLs (1 g total) by mouth 3 (three) times daily. 420 mL 2   Turmeric (QC TUMERIC COMPLEX PO) Take 1 capsule by mouth at bedtime.     dexamethasone (DECADRON) 1 MG tablet Take 1 tablet (1 mg total) by mouth 2 (two) times daily with a meal. (Patient not taking: Reported on 08/26/2021) 7 tablet 0   Docusate Sodium (DSS) 100 MG CAPS Take 1 capsule by mouth every other day. (Patient not taking: Reported on 08/26/2021)     No current facility-administered medications for this visit.     PHYSICAL EXAMINATION: ECOG PERFORMANCE STATUS: 1 - Symptomatic but completely ambulatory Vitals:   08/26/21 0911  BP: 109/78  Pulse: 86  Resp: 18  Temp: 97.9 F (36.6 C)  SpO2: 100%   Filed Weights   08/26/21 0911  Weight: 179 lb 4.8 oz (81.3 kg)    Physical Exam Constitutional:      General: She is not in acute  distress. HENT:     Head: Normocephalic and atraumatic.  Eyes:     General: No scleral icterus. Cardiovascular:     Rate and Rhythm: Normal rate and regular rhythm.     Heart sounds: Normal heart sounds.  Pulmonary:     Effort: Pulmonary effort is normal. No respiratory distress.     Breath sounds: No wheezing.     Comments: Decreased breath sound bibasilar Abdominal:     General:  Bowel sounds are normal. There is no distension.     Palpations: Abdomen is soft.  Musculoskeletal:        General: No deformity. Normal range of motion.     Cervical back: Normal range of motion and neck supple.     Comments: Left lower extremity edema 1+, no tenderness.  Skin:    General: Skin is warm and dry.     Findings: No erythema or rash.  Neurological:     Mental Status: She is alert and oriented to person, place, and time. Mental status is at baseline.     Cranial Nerves: No cranial nerve deficit.     Coordination: Coordination normal.  Psychiatric:        Mood and Affect: Mood normal.    LABORATORY DATA:  I have reviewed the data as listed Lab Results  Component Value Date   WBC 4.2 08/26/2021   HGB 11.3 (L) 08/26/2021   HCT 34.8 (L) 08/26/2021   MCV 87.9 08/26/2021   PLT 271 08/26/2021   Recent Labs    08/11/21 0825 08/18/21 0820 08/26/21 0815  NA 133* 136 136  K 3.4* 3.7 3.4*  CL 96* 99 101  CO2 31 28 30   GLUCOSE 111* 104* 105*  BUN 25* 27* 20  CREATININE 0.54 0.66 0.55  CALCIUM 8.1* 8.2* 8.6*  GFRNONAA >60 >60 >60  PROT 5.6* 5.9* 6.3*  ALBUMIN 3.3* 3.5 3.6  AST 24 25 26   ALT 21 26 26   ALKPHOS 93 96 102  BILITOT 0.9 0.6 0.9    Iron/TIBC/Ferritin/ %Sat No results found for: IRON, TIBC, FERRITIN, IRONPCTSAT    RADIOGRAPHIC STUDIES: I have personally reviewed the radiological images as listed and agreed with the findings in the report. DG Chest Port 1 View  Result Date: 08/01/2021 CLINICAL DATA:  Port-A-Cath placement.  Breast carcinoma. EXAM: PORTABLE CHEST 1  VIEW COMPARISON:  07/25/2021 FINDINGS: Right-sided Port-A-Cath is now seen with tip overlying the superior cavoatrial junction. No evidence of pneumothorax. Diffuse interstitial infiltrates are again seen, without significant change. Probable small right pleural effusion is also stable. Heart size is within normal limits. Aortic atherosclerotic calcification noted. IMPRESSION: Right-sided Port-A-Cath in appropriate position. No pneumothorax visualized. Stable diffuse interstitial infiltrates and probable small right pleural effusion. Electronically Signed   By: Marlaine Hind M.D.   On: 08/01/2021 17:14   DG C-Arm 1-60 Min-No Report  Result Date: 08/01/2021 Fluoroscopy was utilized by the requesting physician.  No radiographic interpretation.      ASSESSMENT & PLAN:  1. Metastatic breast cancer ([place])   2. Encounter for antineoplastic chemotherapy   3. Bilateral pleural effusion   4. Metastasis to bone ([place])   5. Neoplasm related pain   6. Swelling of left lower extremity    # Metastatic Breast cancer with extensive thoracic and bone involvement, in visceral crisis, ER+, PR+ HER2 neg MRI brain is negative Patient tolerated Taxol treatments well.  Minimal neuropathy symptoms. Labs reviewed and discussed with patient Proceed with Taxol today.  Plan to repeat CT for evaluation of treatment response.  Scheduled on 09/03/2021.  #Extensive bone metastasis.  Status post palliative radiation to lumbar/sacrum Currently on radiation to thoracic spine and cervical spine. Has been tapered off dexamethasone. Recent dental clearance.  Plan Zometa once we obtained dental clearance.  # Neoplasm related pain,  Continue oxycodone 8m Q12h PRN for pain.  Pain has improved.  Advised patient to further taper down to oxycodone 5 mg daily as needed.  #Anxiety and  insomnia, continue low-dose Ativan 0.5 mg QHS PRN  #Left lower extremity swelling, obtain ultrasound left lower extremity, rule out DVT   All  questions were answered. The patient knows to call the clinic with any problems questions or concerns.  cc Gayland Curry, MD    Follow up in 1 week  lab MD Taxol treatments.   [name], MD, PhD  08/26/2021

## 2021-08-26 NOTE — Progress Notes (Signed)
Nutrition Assessment   Reason for Assessment:  Referral from Dr Tasia Catchings   ASSESSMENT:  70 year old female with metastatic breast cancer with tumor in lumbar vertebrae, pulmonary nodules, hepatic and bilateral adrenal gland, metastasis, bone mets.  Patient receiving chemotherapy and radiation.    Met with patient during infusion.  Patient reports that her appetite is good.  She has been focusing on trying to get more protein.  Has been keeping food diary.  Reports some irritation and points to mid chest from radiation with eating.  Patient is not avoiding any foods due to this irritation.  Reports that is not eating as much as before but still eating well.     Medications: calcium carbonate, dexamethasone, colace, zofran, KCL, Compazine, carafate, imodium ativan, slow mag   Labs: reviewed   Anthropometrics:   Height: 64 inches Weight: 179 lb 4.8 oz today UBW: 180 BMI: 30   Estimated Energy Needs  Kcals: 2025-2400 Protein: 101-120 g Fluid: 2 L   NUTRITION DIAGNOSIS: none at this time   INTERVENTION:  Discussed foods high in protein. Handout provided Encouraged protein foods at every meal.  Contact information provided   MONITORING, EVALUATION, GOAL: weight trends, intake   Next Visit: phone call Tuesday, Dec 13  Deveion Denz B. Zenia Resides, [employer], [place] Registered Dietitian [phone] (mobile)

## 2021-08-26 NOTE — Progress Notes (Signed)
Pt here for follow up. Pt reports swelling to feet but more on the left foot.

## 2021-08-27 ENCOUNTER — Ambulatory Visit
Admission: RE | Admit: 2021-08-27 | Discharge: 2021-08-27 | Disposition: A | Discharge status: Home / Self Care | Payer: Medicare PPO | Source: Ambulatory Visit | Attending: Radiation Oncology | Admitting: Radiation Oncology

## 2021-08-27 DIAGNOSIS — Z5111 Encounter for antineoplastic chemotherapy: Secondary | ICD-10-CM | POA: Diagnosis not present

## 2021-08-28 ENCOUNTER — Other Ambulatory Visit: Payer: Self-pay

## 2021-08-28 ENCOUNTER — Ambulatory Visit
Admission: RE | Admit: 2021-08-28 | Discharge: 2021-08-28 | Disposition: A | Discharge status: Home / Self Care | Payer: Medicare PPO | Source: Ambulatory Visit | Attending: Oncology | Admitting: Oncology

## 2021-08-28 DIAGNOSIS — C50919 Malignant neoplasm of unspecified site of unspecified female breast: Secondary | ICD-10-CM | POA: Diagnosis not present

## 2021-08-28 IMAGING — US US EXTREM LOW VENOUS*L*
1 series · 13 of 24 positions shown · non-contrast
Comparison: None.

CLINICAL DATA: Lower extremity swelling for 2 weeks



[Series 1: us extrem low venous*left* · 0.07mm/px · 13 of 33 slices shown]
[im 1/33]
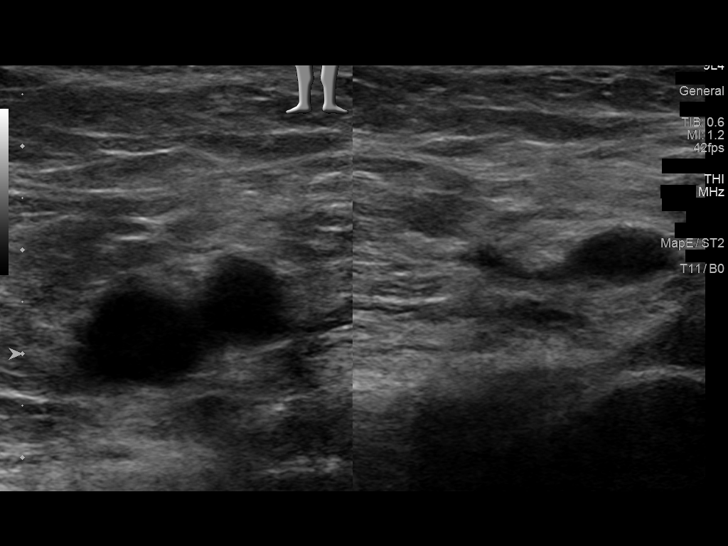
[im 3/33]
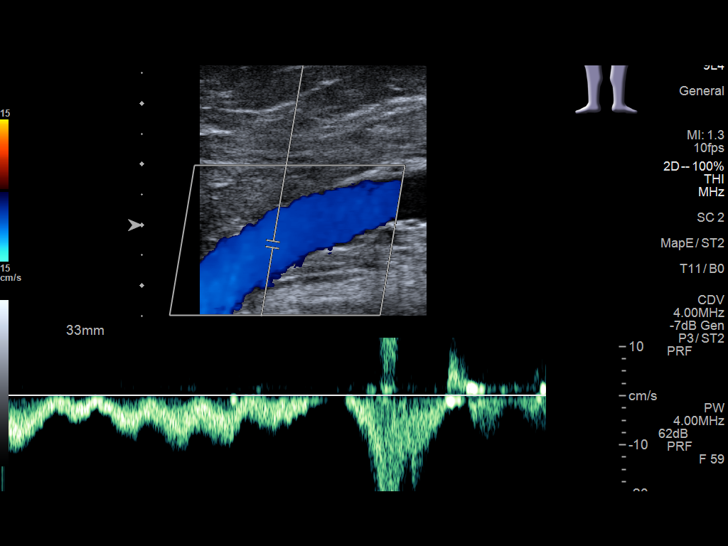
[im 6/33]
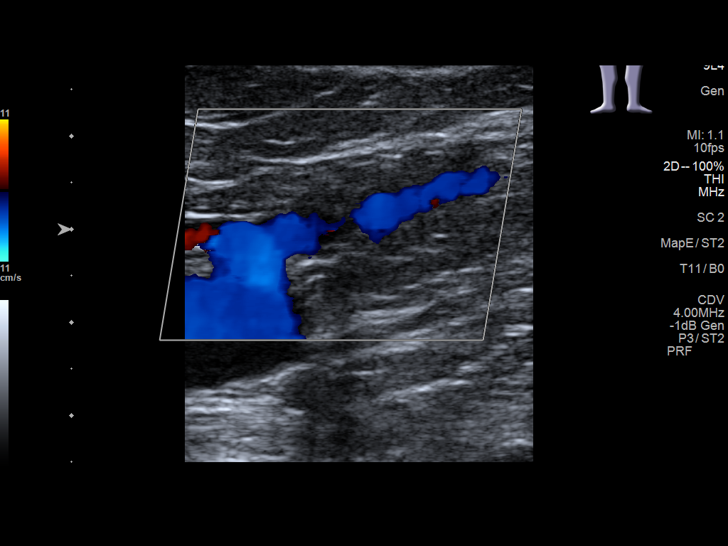
[im 9/33]
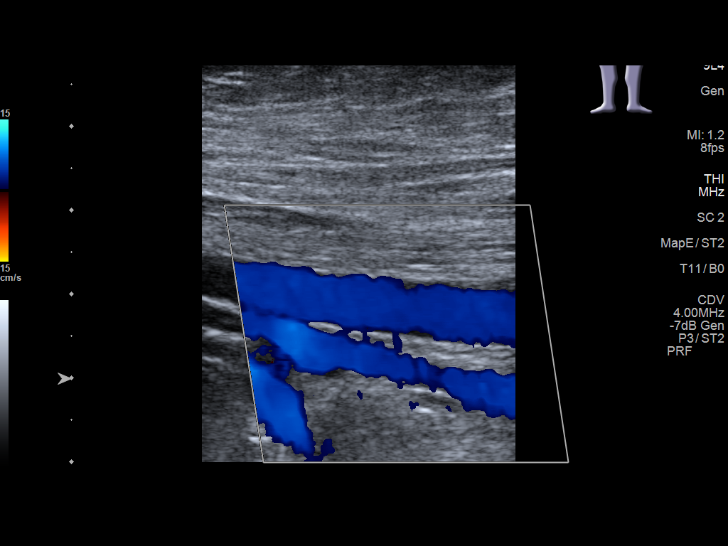
[im 12/33]
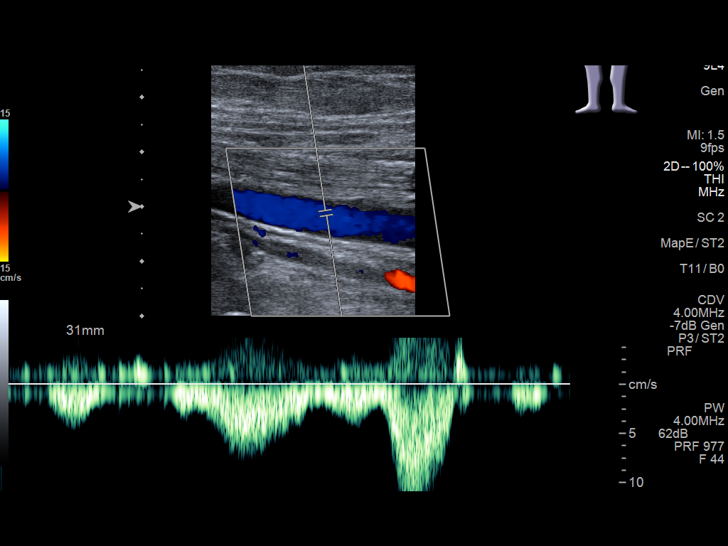
[im 14/33]
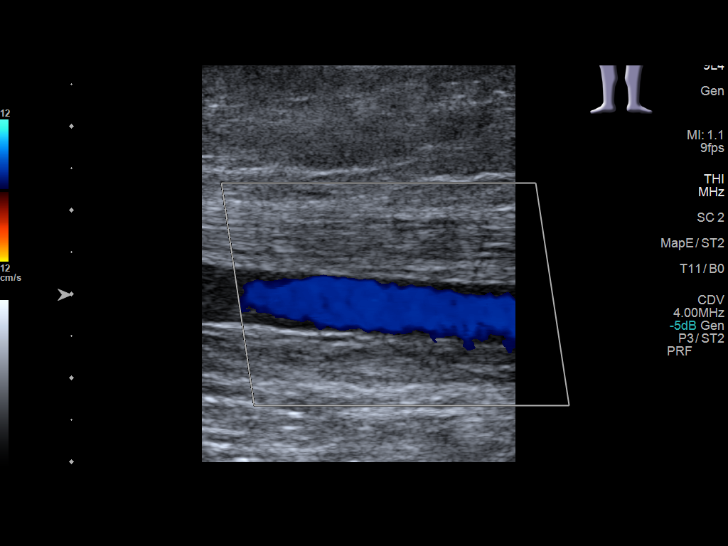
[im 17/33]
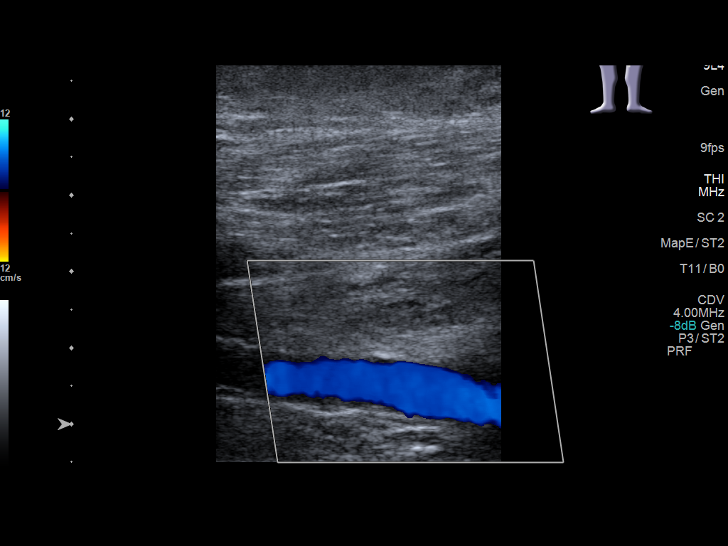
[im 19/33]
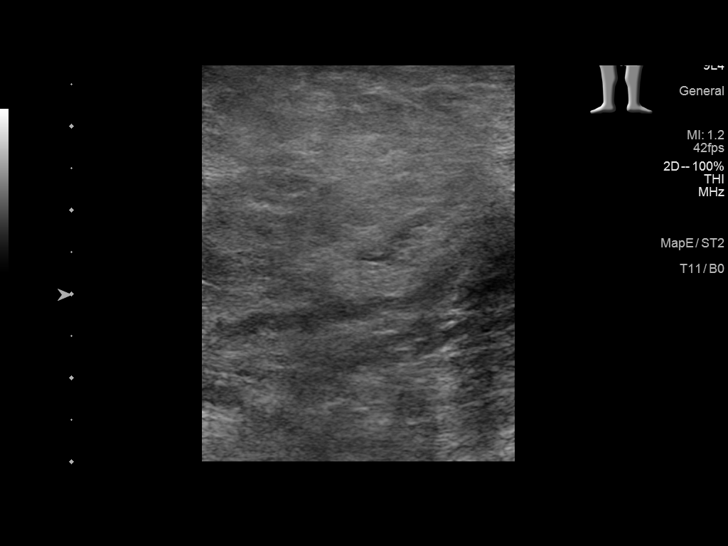
[im 21/33]
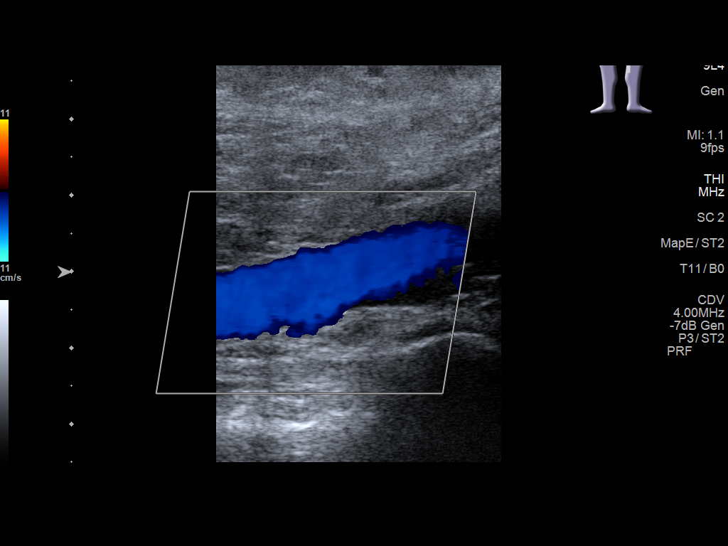
[im 24/33]
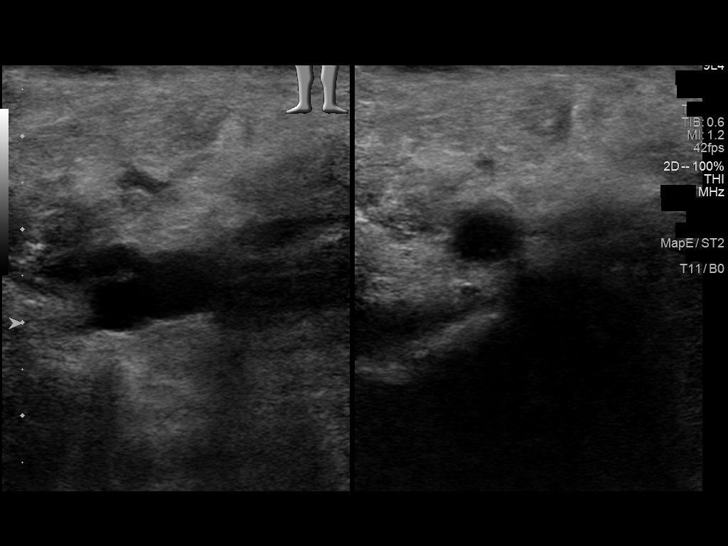
[im 27/33]
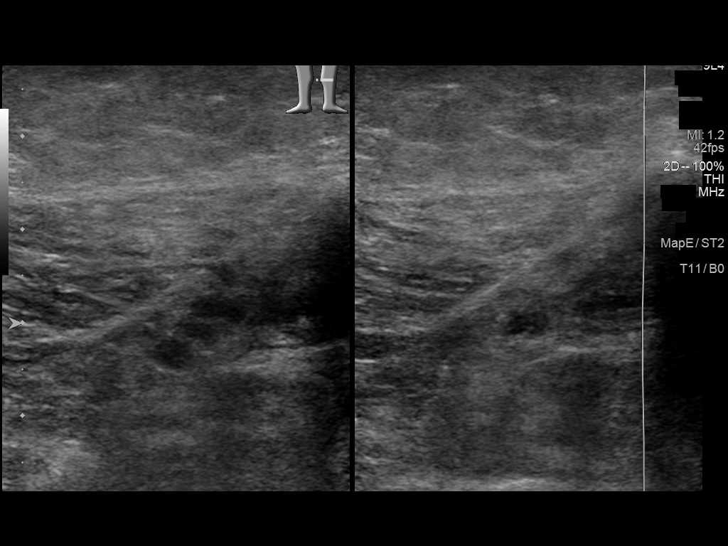
[im 30/33]
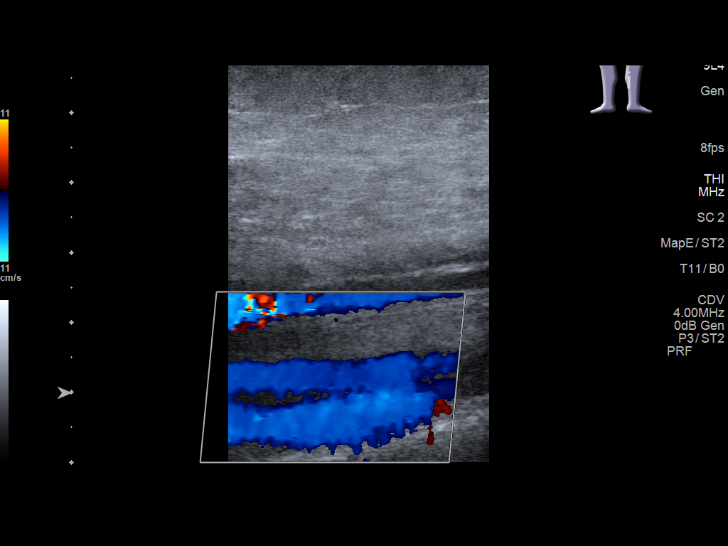
[im 33/33]
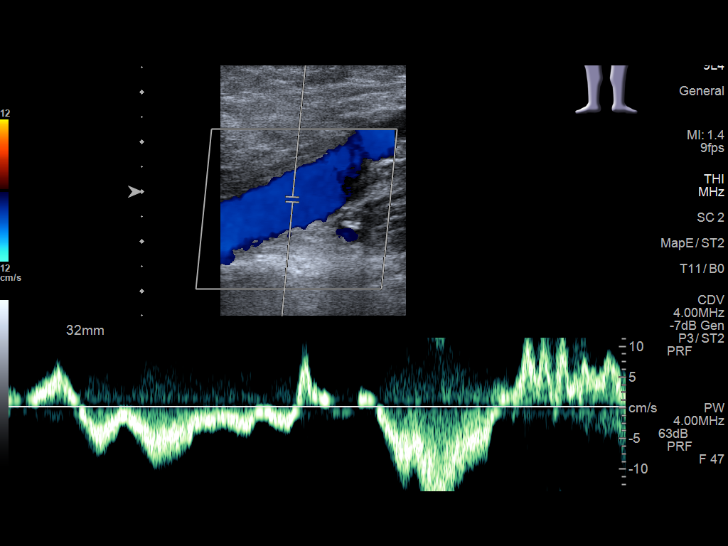

[13 of 24 positions shown; findings below may reference images not displayed]

FINDINGS: Contralateral Common Femoral Vein: Respiratory phasicity is normal
and symmetric with the symptomatic side. No evidence of thrombus.
Normal compressibility.

Common Femoral Vein: No evidence of thrombus. Normal
compressibility, respiratory phasicity and response to augmentation.

Saphenofemoral Junction: No evidence of thrombus. Normal
compressibility and flow on color Doppler imaging.

Profunda Femoral Vein: No evidence of thrombus. Normal
compressibility and flow on color Doppler imaging.

Femoral Vein: No evidence of thrombus. Normal compressibility,
respiratory phasicity and response to augmentation.

Popliteal Vein: No evidence of thrombus. Normal compressibility,
respiratory phasicity and response to augmentation.

Calf Veins: No evidence of thrombus. Normal compressibility and flow
on color Doppler imaging.
IMPRESSION: No evidence of deep venous thrombosis.

## 2021-09-01 ENCOUNTER — Other Ambulatory Visit: Payer: Self-pay | Admitting: Oncology

## 2021-09-02 ENCOUNTER — Inpatient Hospital Stay: Payer: Medicare PPO | Attending: Oncology

## 2021-09-02 ENCOUNTER — Inpatient Hospital Stay: Payer: Medicare PPO

## 2021-09-02 ENCOUNTER — Encounter: Payer: Self-pay | Admitting: Oncology

## 2021-09-02 ENCOUNTER — Other Ambulatory Visit: Payer: Self-pay

## 2021-09-02 ENCOUNTER — Inpatient Hospital Stay: Payer: Medicare PPO | Admitting: Oncology

## 2021-09-02 ENCOUNTER — Ambulatory Visit: Payer: Medicare PPO

## 2021-09-02 VITALS — BP 108/73 | HR 91 | Temp 99.0°F | Wt 176.0 lb

## 2021-09-02 DIAGNOSIS — Z79899 Other long term (current) drug therapy: Secondary | ICD-10-CM | POA: Diagnosis not present

## 2021-09-02 DIAGNOSIS — J90 Pleural effusion, not elsewhere classified: Secondary | ICD-10-CM | POA: Insufficient documentation

## 2021-09-02 DIAGNOSIS — Z8249 Family history of ischemic heart disease and other diseases of the circulatory system: Secondary | ICD-10-CM | POA: Diagnosis not present

## 2021-09-02 DIAGNOSIS — Z8052 Family history of malignant neoplasm of bladder: Secondary | ICD-10-CM | POA: Insufficient documentation

## 2021-09-02 DIAGNOSIS — Z923 Personal history of irradiation: Secondary | ICD-10-CM | POA: Insufficient documentation

## 2021-09-02 DIAGNOSIS — C787 Secondary malignant neoplasm of liver and intrahepatic bile duct: Secondary | ICD-10-CM | POA: Diagnosis not present

## 2021-09-02 DIAGNOSIS — Z95828 Presence of other vascular implants and grafts: Secondary | ICD-10-CM

## 2021-09-02 DIAGNOSIS — Z818 Family history of other mental and behavioral disorders: Secondary | ICD-10-CM | POA: Insufficient documentation

## 2021-09-02 DIAGNOSIS — Z87891 Personal history of nicotine dependence: Secondary | ICD-10-CM | POA: Diagnosis not present

## 2021-09-02 DIAGNOSIS — Z809 Family history of malignant neoplasm, unspecified: Secondary | ICD-10-CM | POA: Insufficient documentation

## 2021-09-02 DIAGNOSIS — C50919 Malignant neoplasm of unspecified site of unspecified female breast: Secondary | ICD-10-CM | POA: Diagnosis present

## 2021-09-02 DIAGNOSIS — R0602 Shortness of breath: Secondary | ICD-10-CM | POA: Diagnosis not present

## 2021-09-02 DIAGNOSIS — Z836 Family history of other diseases of the respiratory system: Secondary | ICD-10-CM | POA: Diagnosis not present

## 2021-09-02 DIAGNOSIS — Z808 Family history of malignant neoplasm of other organs or systems: Secondary | ICD-10-CM | POA: Diagnosis not present

## 2021-09-02 DIAGNOSIS — C7951 Secondary malignant neoplasm of bone: Secondary | ICD-10-CM | POA: Diagnosis not present

## 2021-09-02 DIAGNOSIS — C7970 Secondary malignant neoplasm of unspecified adrenal gland: Secondary | ICD-10-CM | POA: Insufficient documentation

## 2021-09-02 DIAGNOSIS — G893 Neoplasm related pain (acute) (chronic): Secondary | ICD-10-CM | POA: Diagnosis not present

## 2021-09-02 DIAGNOSIS — Z5111 Encounter for antineoplastic chemotherapy: Secondary | ICD-10-CM

## 2021-09-02 DIAGNOSIS — R200 Anesthesia of skin: Secondary | ICD-10-CM | POA: Insufficient documentation

## 2021-09-02 DIAGNOSIS — Z170 Estrogen receptor positive status [ER+]: Secondary | ICD-10-CM | POA: Insufficient documentation

## 2021-09-02 DIAGNOSIS — M549 Dorsalgia, unspecified: Secondary | ICD-10-CM | POA: Diagnosis not present

## 2021-09-02 DIAGNOSIS — Z801 Family history of malignant neoplasm of trachea, bronchus and lung: Secondary | ICD-10-CM | POA: Diagnosis not present

## 2021-09-02 DIAGNOSIS — M5126 Other intervertebral disc displacement, lumbar region: Secondary | ICD-10-CM | POA: Insufficient documentation

## 2021-09-02 DIAGNOSIS — Z833 Family history of diabetes mellitus: Secondary | ICD-10-CM | POA: Diagnosis not present

## 2021-09-02 DIAGNOSIS — Z452 Encounter for adjustment and management of vascular access device: Secondary | ICD-10-CM | POA: Insufficient documentation

## 2021-09-02 LAB — CBC WITH DIFFERENTIAL/PLATELET
Abs Immature Granulocytes: 0.13 10*3/uL — ABNORMAL HIGH (ref 0.00–0.07)
Basophils Absolute: 0 10*3/uL (ref 0.0–0.1)
Basophils Relative: 1 %
Eosinophils Absolute: 0 10*3/uL (ref 0.0–0.5)
Eosinophils Relative: 1 %
HCT: 34.4 % — ABNORMAL LOW (ref 36.0–46.0)
Hemoglobin: 11.4 g/dL — ABNORMAL LOW (ref 12.0–15.0)
Immature Granulocytes: 3 %
Lymphocytes Relative: 6 %
Lymphs Abs: 0.3 10*3/uL — ABNORMAL LOW (ref 0.7–4.0)
MCH: 29 pg (ref 26.0–34.0)
MCHC: 33.1 g/dL (ref 30.0–36.0)
MCV: 87.5 fL (ref 80.0–100.0)
Monocytes Absolute: 0.5 10*3/uL (ref 0.1–1.0)
Monocytes Relative: 11 %
Neutro Abs: 3.9 10*3/uL (ref 1.7–7.7)
Neutrophils Relative %: 78 %
Platelets: 277 10*3/uL (ref 150–400)
RBC: 3.93 MIL/uL (ref 3.87–5.11)
RDW: 17.6 % — ABNORMAL HIGH (ref 11.5–15.5)
WBC: 4.9 10*3/uL (ref 4.0–10.5)
nRBC: 1.6 % — ABNORMAL HIGH (ref 0.0–0.2)

## 2021-09-02 LAB — COMPREHENSIVE METABOLIC PANEL
ALT: 21 U/L (ref 0–44)
AST: 23 U/L (ref 15–41)
Albumin: 3.7 g/dL (ref 3.5–5.0)
Alkaline Phosphatase: 89 U/L (ref 38–126)
Anion gap: 10 (ref 5–15)
BUN: 22 mg/dL (ref 8–23)
CO2: 28 mmol/L (ref 22–32)
Calcium: 9 mg/dL (ref 8.9–10.3)
Chloride: 97 mmol/L — ABNORMAL LOW (ref 98–111)
Creatinine, Ser: 0.56 mg/dL (ref 0.44–1.00)
GFR, Estimated: >60 mL/min (ref >60)
Glucose, Bld: 128 mg/dL — ABNORMAL HIGH (ref 70–99)
Potassium: 3.5 mmol/L (ref 3.5–5.1)
Sodium: 135 mmol/L (ref 135–145)
Total Bilirubin: 1.3 mg/dL — ABNORMAL HIGH (ref 0.3–1.2)
Total Protein: 6.5 g/dL (ref 6.5–8.1)

## 2021-09-02 MED ORDER — SODIUM CHLORIDE 0.9 % IV SOLN
80.0000 mg/m2 | Freq: Once | INTRAVENOUS | Status: AC
  Administered 2021-09-02: 156 mg via INTRAVENOUS
  Filled 2021-09-02: qty 26

## 2021-09-02 MED ORDER — ZOLEDRONIC ACID 4 MG/100ML IV SOLN
4.0000 mg | Freq: Once | INTRAVENOUS | Status: AC
  Administered 2021-09-02: 4 mg via INTRAVENOUS
  Filled 2021-09-02: qty 100

## 2021-09-02 MED ORDER — HEPARIN SOD (PORK) LOCK FLUSH 100 UNIT/ML IV SOLN
500.0000 [IU] | Freq: Once | INTRAVENOUS | Status: AC | PRN
  Administered 2021-09-02: 500 [IU]
  Filled 2021-09-02: qty 5

## 2021-09-02 MED ORDER — FAMOTIDINE 20 MG IN NS 100 ML IVPB
20.0000 mg | Freq: Once | INTRAVENOUS | Status: AC
  Administered 2021-09-02: 20 mg via INTRAVENOUS
  Filled 2021-09-02: qty 20
  Filled 2021-09-02: qty 100

## 2021-09-02 MED ORDER — SODIUM CHLORIDE 0.9 % IV SOLN
Freq: Once | INTRAVENOUS | Status: AC
  Filled 2021-09-02: qty 250

## 2021-09-02 MED ORDER — POTASSIUM CHLORIDE CRYS ER 20 MEQ PO TBCR
20.0000 meq | EXTENDED_RELEASE_TABLET | Freq: Every day | ORAL | 0 refills | Status: DC
Start: 2021-09-02 — End: 2021-09-09

## 2021-09-02 MED ORDER — DIPHENHYDRAMINE HCL 50 MG/ML IJ SOLN
25.0000 mg | Freq: Once | INTRAMUSCULAR | Status: AC
  Administered 2021-09-02: 25 mg via INTRAVENOUS
  Filled 2021-09-02: qty 1

## 2021-09-02 MED ORDER — SODIUM CHLORIDE 0.9 % IV SOLN
10.0000 mg | Freq: Once | INTRAVENOUS | Status: AC
  Administered 2021-09-02: 10 mg via INTRAVENOUS
  Filled 2021-09-02: qty 10

## 2021-09-02 MED ORDER — SODIUM CHLORIDE 0.9% FLUSH
10.0000 mL | Freq: Once | INTRAVENOUS | Status: DC
  Administered 2021-09-02: 10 mL via INTRAVENOUS
  Filled 2021-09-02: qty 10

## 2021-09-02 NOTE — Patient Instructions (Signed)
[facility] [place]  Discharge Instructions: Thank you for choosing [facility] to provide your oncology and hematology care.  If you have a lab appointment with the [facility], please go directly to the [facility] and check in at the registration area.  Wear comfortable clothing and clothing appropriate for easy access to any Portacath or PICC line.   We strive to give you quality time with your provider. You may need to reschedule your appointment if you arrive late (15 or more minutes).  Arriving late affects you and other patients whose appointments are after yours.  Also, if you miss three or more appointments without notifying the office, you may be dismissed from the clinic at the provider's discretion.      For prescription refill requests, have your pharmacy contact our office and allow 72 hours for refills to be completed.    Today you received the following chemotherapy and/or immunotherapy agents TAXOL and ZOMETA      To help prevent nausea and vomiting after your treatment, we encourage you to take your nausea medication as directed.  BELOW ARE SYMPTOMS THAT SHOULD BE REPORTED IMMEDIATELY: *FEVER GREATER THAN 100.4 F (38 C) OR HIGHER *CHILLS OR SWEATING *NAUSEA AND VOMITING THAT IS NOT CONTROLLED WITH YOUR NAUSEA MEDICATION *UNUSUAL SHORTNESS OF BREATH *UNUSUAL BRUISING OR BLEEDING *URINARY PROBLEMS (pain or burning when urinating, or frequent urination) *BOWEL PROBLEMS (unusual diarrhea, constipation, pain near the anus) TENDERNESS IN MOUTH AND THROAT WITH OR WITHOUT PRESENCE OF ULCERS (sore throat, sores in mouth, or a toothache) UNUSUAL RASH, SWELLING OR PAIN  UNUSUAL VAGINAL DISCHARGE OR ITCHING   Items with * indicate a potential emergency and should be followed up as soon as possible or go to the Emergency Department if any problems should occur.  Please show the CHEMOTHERAPY ALERT CARD or IMMUNOTHERAPY ALERT CARD at  check-in to the Emergency Department and triage nurse.  Should you have questions after your visit or need to cancel or reschedule your appointment, please contact [facility] [place] [facility] [place]  [phone] and follow the prompts.  Office hours are 8:00 a.m. to 4:30 p.m. Monday - Friday. Please note that voicemails left after 4:00 p.m. may not be returned until the following business day.  We are closed weekends and major holidays. You have access to a nurse at all times for urgent questions. Please call the main number to the clinic [phone] and follow the prompts.  For any non-urgent questions, you may also contact your provider using MyChart. We now offer e-Visits for anyone 66 and older to request care online for non-urgent symptoms. For details visit [email or web address].   Also download the MyChart app! Go to the app store, search "MyChart", open the app, select [facility], and log in with your MyChart username and password.  Due to Covid, a mask is required upon entering the hospital/clinic. If you do not have a mask, one will be given to you upon arrival. For doctor visits, patients may have 1 support person aged 19 or older with them. For treatment visits, patients cannot have anyone with them due to current Covid guidelines and our immunocompromised population.   Paclitaxel injection What is this medication? PACLITAXEL (PAK li TAX el) is a chemotherapy drug. It targets fast dividing cells, like cancer cells, and causes these cells to die. This medicine is used to treat ovarian cancer, breast cancer, lung cancer, Kaposi's sarcoma, and other cancers. This medicine may be used for other  purposes; ask your health care provider or pharmacist if you have questions. COMMON BRAND NAME(S): Onxol, Taxol What should I tell my care team before I take this medication? They need to know if you have any of these conditions: history of irregular heartbeat liver disease low blood  counts, like low white cell, platelet, or red cell counts lung or breathing disease, like asthma tingling of the fingers or toes, or other nerve disorder an unusual or allergic reaction to paclitaxel, alcohol, polyoxyethylated castor oil, other chemotherapy, other medicines, foods, dyes, or preservatives pregnant or trying to get pregnant breast-feeding How should I use this medication? This drug is given as an infusion into a vein. It is administered in a hospital or clinic by a specially trained health care professional. Talk to your pediatrician regarding the use of this medicine in children. Special care may be needed. Overdosage: If you think you have taken too much of this medicine contact a poison control center or emergency room at once. NOTE: This medicine is only for you. Do not share this medicine with others. What if I miss a dose? It is important not to miss your dose. Call your doctor or health care professional if you are unable to keep an appointment. What may interact with this medication? Do not take this medicine with any of the following medications: live virus vaccines This medicine may also interact with the following medications: antiviral medicines for hepatitis, HIV or AIDS certain antibiotics like erythromycin and clarithromycin certain medicines for fungal infections like ketoconazole and itraconazole certain medicines for seizures like carbamazepine, phenobarbital, phenytoin gemfibrozil nefazodone rifampin St. John's wort This list may not describe all possible interactions. Give your health care provider a list of all the medicines, herbs, non-prescription drugs, or dietary supplements you use. Also tell them if you smoke, drink alcohol, or use illegal drugs. Some items may interact with your medicine. What should I watch for while using this medication? Your condition will be monitored carefully while you are receiving this medicine. You will need important  blood work done while you are taking this medicine. This medicine can cause serious allergic reactions. To reduce your risk you will need to take other medicine(s) before treatment with this medicine. If you experience allergic reactions like skin rash, itching or hives, swelling of the face, lips, or tongue, tell your doctor or health care professional right away. In some cases, you may be given additional medicines to help with side effects. Follow all directions for their use. This drug may make you feel generally unwell. This is not uncommon, as chemotherapy can affect healthy cells as well as cancer cells. Report any side effects. Continue your course of treatment even though you feel ill unless your doctor tells you to stop. Call your doctor or health care professional for advice if you get a fever, chills or sore throat, or other symptoms of a cold or flu. Do not treat yourself. This drug decreases your body's ability to fight infections. Try to avoid being around people who are sick. This medicine may increase your risk to bruise or bleed. Call your doctor or health care professional if you notice any unusual bleeding. Be careful brushing and flossing your teeth or using a toothpick because you may get an infection or bleed more easily. If you have any dental work done, tell your dentist you are receiving this medicine. Avoid taking products that contain aspirin, acetaminophen, ibuprofen, naproxen, or ketoprofen unless instructed by your doctor. These medicines may   hide a fever. Do not become pregnant while taking this medicine. Women should inform their doctor if they wish to become pregnant or think they might be pregnant. There is a potential for serious side effects to an unborn child. Talk to your health care professional or pharmacist for more information. Do not breast-feed an infant while taking this medicine. Men are advised not to father a child while receiving this medicine. This product  may contain alcohol. Ask your pharmacist or healthcare provider if this medicine contains alcohol. Be sure to tell all healthcare providers you are taking this medicine. Certain medicines, like metronidazole and disulfiram, can cause an unpleasant reaction when taken with alcohol. The reaction includes flushing, headache, nausea, vomiting, sweating, and increased thirst. The reaction can last from 30 minutes to several hours. What side effects may I notice from receiving this medication? Side effects that you should report to your doctor or health care professional as soon as possible: allergic reactions like skin rash, itching or hives, swelling of the face, lips, or tongue breathing problems changes in vision fast, irregular heartbeat high or low blood pressure mouth sores pain, tingling, numbness in the hands or feet signs of decreased platelets or bleeding - bruising, pinpoint red spots on the skin, black, tarry stools, blood in the urine signs of decreased red blood cells - unusually weak or tired, feeling faint or lightheaded, falls signs of infection - fever or chills, cough, sore throat, pain or difficulty passing urine signs and symptoms of liver injury like dark yellow or brown urine; general ill feeling or flu-like symptoms; light-colored stools; loss of appetite; nausea; right upper belly pain; unusually weak or tired; yellowing of the eyes or skin swelling of the ankles, feet, hands unusually slow heartbeat Side effects that usually do not require medical attention (report to your doctor or health care professional if they continue or are bothersome): diarrhea hair loss loss of appetite muscle or joint pain nausea, vomiting pain, redness, or irritation at site where injected tiredness This list may not describe all possible side effects. Call your doctor for medical advice about side effects. You may report side effects to FDA at 1-800-FDA-1088. Where should I keep my  medication? This drug is given in a hospital or clinic and will not be stored at home. NOTE: This sheet is a summary. It may not cover all possible information. If you have questions about this medicine, talk to your doctor, pharmacist, or health care provider.  2022 Elsevier/Gold Standard (2021-06-03 00:00:00)  Zoledronic Acid Injection (Hypercalcemia, Oncology) What is this medication? ZOLEDRONIC ACID (ZOE le dron ik AS id) slows calcium loss from bones. It high calcium levels in the blood from some kinds of cancer. It may be used in other people at risk for bone loss. This medicine may be used for other purposes; ask your health care provider or pharmacist if you have questions. COMMON BRAND NAME(S): Zometa What should I tell my care team before I take this medication? They need to know if you have any of these conditions: cancer dehydration dental disease kidney disease liver disease low levels of calcium in the blood lung or breathing disease (asthma) receiving steroids like dexamethasone or prednisone an unusual or allergic reaction to zoledronic acid, other medicines, foods, dyes, or preservatives pregnant or trying to get pregnant breast-feeding How should I use this medication? This drug is injected into a vein. It is given by a health care provider in a hospital or clinic setting. Talk to your  health care provider about the use of this drug in children. Special care may be needed. Overdosage: If you think you have taken too much of this medicine contact a poison control center or emergency room at once. NOTE: This medicine is only for you. Do not share this medicine with others. What if I miss a dose? Keep appointments for follow-up doses. It is important not to miss your dose. Call your health care provider if you are unable to keep an appointment. What may interact with this medication? certain antibiotics given by injection NSAIDs, medicines for pain and inflammation,  like ibuprofen or naproxen some diuretics like bumetanide, furosemide teriparatide thalidomide This list may not describe all possible interactions. Give your health care provider a list of all the medicines, herbs, non-prescription drugs, or dietary supplements you use. Also tell them if you smoke, drink alcohol, or use illegal drugs. Some items may interact with your medicine. What should I watch for while using this medication? Visit your health care provider for regular checks on your progress. It may be some time before you see the benefit from this drug. Some people who take this drug have severe bone, joint, or muscle pain. This drug may also increase your risk for jaw problems or a broken thigh bone. Tell your health care provider right away if you have severe pain in your jaw, bones, joints, or muscles. Tell you health care provider if you have any pain that does not go away or that gets worse. Tell your dentist and dental surgeon that you are taking this drug. You should not have major dental surgery while on this drug. See your dentist to have a dental exam and fix any dental problems before starting this drug. Take good care of your teeth while on this drug. Make sure you see your dentist for regular follow-up appointments. You should make sure you get enough calcium and vitamin D while you are taking this drug. Discuss the foods you eat and the vitamins you take with your health care provider. Check with your health care provider if you have severe diarrhea, nausea, and vomiting, or if you sweat a lot. The loss of too much body fluid may make it dangerous for you to take this drug. You may need blood work done while you are taking this drug. Do not become pregnant while taking this drug. Women should inform their health care provider if they wish to become pregnant or think they might be pregnant. There is potential for serious harm to an unborn child. Talk to your health care provider for  more information. What side effects may I notice from receiving this medication? Side effects that you should report to your doctor or health care provider as soon as possible: allergic reactions (skin rash, itching or hives; swelling of the face, lips, or tongue) bone pain infection (fever, chills, cough, sore throat, pain or trouble passing urine) jaw pain, especially after dental work joint pain kidney injury (trouble passing urine or change in the amount of urine) low blood pressure (dizziness; feeling faint or lightheaded, falls; unusually weak or tired) low calcium levels (fast heartbeat; muscle cramps or pain; pain, tingling, or numbness in the hands or feet; seizures) low magnesium levels (fast, irregular heartbeat; muscle cramp or pain; muscle weakness; tremors; seizures) low red blood cell counts (trouble breathing; feeling faint; lightheaded, falls; unusually weak or tired) muscle pain redness, blistering, peeling, or loosening of the skin, including inside the mouth severe diarrhea swelling of the  ankles, feet, hands trouble breathing Side effects that usually do not require medical attention (report to your doctor or health care provider if they continue or are bothersome): anxious constipation coughing depressed mood eye irritation, itching, or pain fever general ill feeling or flu-like symptoms nausea pain, redness, or irritation at site where injected trouble sleeping This list may not describe all possible side effects. Call your doctor for medical advice about side effects. You may report side effects to FDA at 1-800-FDA-1088. Where should I keep my medication? This drug is given in a hospital or clinic. It will not be stored at home. NOTE: This sheet is a summary. It may not cover all possible information. If you have questions about this medicine, talk to your doctor, pharmacist, or health care provider.  2022 Elsevier/Gold Standard (2021-06-03 00:00:00)

## 2021-09-02 NOTE — Progress Notes (Signed)
Hematology/Oncology follow up note [phone] [phone]   Patient Care Team: Gayland Curry, MD as PCP - General (Family Medicine) Noreene Filbert, MD as Consulting Physician (Radiation Oncology)  REFERRING PROVIDER: Gayland Curry, MD  CHIEF COMPLAINTS/REASON FOR VISIT:  metastatic breast caner  HISTORY OF PRESENTING ILLNESS:   Amber Blevins is a  70 y.o.  female with PMH listed below was seen in consultation at the request of  Gayland Curry, MD  for evaluation of tumor in lumbar vertebrae  # patient reports feeling tightness and pain in Feb 2022. She was started on conservative management which did not help her symptoms. She also started to experience numbness.  She takes gabapentin which partially relieve the discomfort. She denies bowel or bladder incontinence, no lower extremity weakness.   06/25/2021 MRI lumbar spine IMPRESSION:  1. 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 Additional metastatic involvement T12, L1 through L3.  No primary tumor site identified. Top differential considerations are Metastatic Disease Unknown primary, less likely Lymphoma or Multiple Myeloma.   2. Superimposed lumbar spine degeneration, including degenerative moderate to severe left L3 and L4 nerve level impingement from disc herniation.    Patient was seen by neurosurgeon Dr.Yarbrough today. He has ordered MRI cervical and thoracic spine and sacrum biopsy for further work up.   Patient has a history of breast cancer, diagnosed in 2010 Left-sided T2 (2.4cm) N0, ER/PR positive, HER2 negative IDC of the breast, s/p lumpectomy by Dr.Meyer at [facility]. Oncotype score of 11 who completed radiation and she finished 10 years  of Femara from 05/2009 and stopped in 2020.  she has intentionally lost some weight, no night sweating, fever.   # 07/02/2021, CT chest abdomen pelvis showed innumerable small pulmonary and pleural nodules consistent with diffuse metastasis.  Associated with probable malignant pleural effusion.  Mediastinal and hilar lymphadenopathy consistent with metastatic disease.  Hepatic and bilateral adrenal gland metastasis.  Diffuse extensive destructive metastatic bone disease involving pelvis.   07/11/2021 - 07/14/2021  Patient was admitted to hospital due to shortness of breath. 07/12/2021, CT chest PE protocol showed no PE.  Further increase of March pleural effusion and admit to moderate interstitial pulmonary edema.  Bulky mediastinal and hilar lymphadenopathy.  Unchanged.  Incompletely visualized left adrenal nodule. 07/12/2021 patient underwent right thoracentesis.  Cytology was positive for metastatic carcinoma, compatible with breast origin.  ER/PR/HER2 status is pending.  07/16/2021, patient underwent iliac bone biopsy.  Positive for metastatic carcinoma. 07/25/2021, patient underwent thoracentesis of right side, removed 550 cc straw-colored pleural fluid.  She tolerated procedure well. Today she reports shortness of breath with exertion has slightly improved.  She uses home oxygen 2 L  INTERVAL HISTORY Amber Blevins is a 70 y.o. female who has above history reviewed by me today presents for follow up visit for management of metastatic breast cancer . Breathing is stable.  Off oxygen. She has been off oxycodone.  Pain has improved.  Occasionally she takes a Tylenol. Patient takes gabapentin 600 mg twice daily sometimes 3 times a day. Continues to have intermittent numbness tingling hands and feet.  Normal, Review of Systems  Constitutional:  Negative for appetite change, chills, fatigue and fever.  HENT:   Negative for hearing loss and voice change.   Eyes:  Negative for eye  problems.  Respiratory:  Negative for chest tightness and cough.   Cardiovascular:  Negative for chest pain.  Gastrointestinal:  Negative for abdominal distention, abdominal pain and blood in stool.  Endocrine: Negative for hot flashes.  Genitourinary:  Negative for difficulty urinating and frequency.   Musculoskeletal:  Positive for back pain. Negative for arthralgias.       Lower back/sacrum pain and numbness  Skin:  Negative for itching and rash.  Neurological:  Negative for extremity weakness.  Hematological:  Negative for adenopathy.  Psychiatric/Behavioral:  Negative for confusion.    MEDICAL HISTORY:  Past Medical History:  Diagnosis Date   Breast cancer ([place])    COPD (chronic obstructive pulmonary disease) ([place])    Family history of adverse reaction to anesthesia    brother has problem with coming out of anesthesia   High cholesterol    Hypertension    Personal history of radiation therapy    Pre-diabetes     SURGICAL HISTORY: Past Surgical History:  Procedure Laterality Date   BREAST LUMPECTOMY Left    2010   COLONOSCOPY     EYE SURGERY Left    Cataract surgery   PORTACATH PLACEMENT N/A 08/01/2021   Procedure: INSERTION PORT-A-CATH;  Surgeon: Herbert Pun, MD;  Location: [facility] ORS;  Service: General;  Laterality: N/A;   thorocentesis Right 07/10/2021   and another on 07/25/21    SOCIAL HISTORY: Social History   Socioeconomic History   Marital status: Married    Spouse name: Not on file   Number of children: Not on file   Years of education: Not on file   Highest education level: Not on file  Occupational History   Not on file  Tobacco Use   Smoking status: Former    Packs/day: 1.00    Years: 25.00    Pack years: 25.00    Types: Cigarettes    Quit date: 9    Years since quitting: 32.9   Smokeless tobacco: Never  Vaping Use   Vaping Use: Never used  Substance and Sexual Activity   Alcohol use: Not Currently   Drug use: Never   Sexual  activity: Not on file  Other Topics Concern   Not on file  Social History Narrative   Not on file   Social Determinants of Health   Financial Resource Strain: Not on file  Food Insecurity: Not on file  Transportation Needs: Not on file  Physical Activity: Not on file  Stress: Not on file  Social Connections: Not on file  Intimate Partner Violence: Not on file    FAMILY HISTORY: Family History  Problem Relation Age of Onset   Cancer Mother        gynecological   Lung cancer Mother    Diabetes Father    Heart disease Father    Parkinson's disease Father    Brain cancer Brother    Bladder Cancer Brother    Pulmonary disease Brother    Rheumatic fever Brother    Breast cancer Neg Hx     ALLERGIES:  is allergic to green tea (camellia sinensis), melaleuca viridiflora, and lisinopril.  MEDICATIONS:  Current Outpatient Medications  Medication Sig Dispense Refill   acetaminophen (TYLENOL) 650 MG CR tablet Take 650 mg by mouth every 8 (eight) hours as needed for pain.     albuterol (VENTOLIN HFA) 108 (90 Base) MCG/ACT inhaler Inhale 2 puffs into the lungs every 6 (six) hours as needed.     aspirin 81 MG EC tablet  Take by mouth.     atorvastatin (LIPITOR) 40 MG tablet Take 40 mg by mouth at bedtime.     Boswellia-Glucosamine-Vit D (OSTEO BI-FLEX-GLUCOS/5-LOXIN) TABS Take 1 capsule by mouth in the morning and at bedtime.     Calcium Carb-Cholecalciferol 600-400 MG-UNIT TABS Take 1 tablet by mouth daily.     Fluticasone-Umeclidin-Vilant (TRELEGY ELLIPTA) 100-62.5-25 MCG/INH AEPB Inhale into the lungs. Inhale 1 inhalation into the lungs once daily     gabapentin (NEURONTIN) 300 MG capsule Take 2 capsules by mouth 2 (two) times daily.     ibuprofen (ADVIL) 200 MG tablet Take by mouth. Take 400 mg by mouth every six (6) hours as needed for pain.     lidocaine-prilocaine (EMLA) cream Apply 1 application topically as needed. 30 g 5   Loperamide HCl (IMODIUM PO) Take by mouth.      LORazepam (ATIVAN) 0.5 MG tablet Take 1 tablet (0.5 mg total) by mouth at bedtime as needed for anxiety. 30 tablet 0   magnesium chloride (SLOW-MAG) 64 MG TBEC SR tablet Take 1 tablet by mouth at bedtime.     metoprolol-hydrochlorothiazide (LOPRESSOR HCT) 50-25 MG tablet Take 1 tablet by mouth daily. Take 1 tablet by mouth once daily     ondansetron (ZOFRAN) 8 MG tablet Take 1 tablet (8 mg total) by mouth 2 (two) times daily as needed (Nausea or vomiting). 30 tablet 1   oxyCODONE (OXY IR/ROXICODONE) 5 MG immediate release tablet Take 1 tablet (5 mg total) by mouth every 12 (twelve) hours as needed for severe pain or moderate pain. 60 tablet 0   prochlorperazine (COMPAZINE) 10 MG tablet Take 1 tablet (10 mg total) by mouth every 6 (six) hours as needed (Nausea or vomiting). 30 tablet 1   sucralfate (CARAFATE) 1 GM/10ML suspension Take 10 mLs (1 g total) by mouth 3 (three) times daily. 420 mL 2   Turmeric (QC TUMERIC COMPLEX PO) Take 1 capsule by mouth at bedtime.     dexamethasone (DECADRON) 1 MG tablet Take 1 tablet (1 mg total) by mouth 2 (two) times daily with a meal. (Patient not taking: Reported on 08/26/2021) 7 tablet 0   Docusate Sodium (DSS) 100 MG CAPS Take 1 capsule by mouth every other day. (Patient not taking: Reported on 08/26/2021)     potassium chloride SA (KLOR-CON M) 20 MEQ tablet Take 1 tablet (20 mEq total) by mouth daily. 30 tablet 0   No current facility-administered medications for this visit.     PHYSICAL EXAMINATION: ECOG PERFORMANCE STATUS: 1 - Symptomatic but completely ambulatory Vitals:   09/02/21 0838  BP: 108/73  Pulse: 91  Temp: 99 F (37.2 C)   Filed Weights   09/02/21 0838  Weight: 176 lb (79.8 kg)    Physical Exam Constitutional:      General: She is not in acute distress. HENT:     Head: Normocephalic and atraumatic.  Eyes:     General: No scleral icterus. Cardiovascular:     Rate and Rhythm: Normal rate and regular rhythm.     Heart sounds:  Normal heart sounds.  Pulmonary:     Effort: Pulmonary effort is normal. No respiratory distress.     Breath sounds: No wheezing.     Comments: Decreased breath sound bibasilar Abdominal:     General: Bowel sounds are normal. There is no distension.     Palpations: Abdomen is soft.  Musculoskeletal:        General: No deformity. Normal range of motion.  Cervical back: Normal range of motion and neck supple.     Comments: Left lower extremity edema 1+, no tenderness.  Skin:    General: Skin is warm and dry.     Findings: No erythema or rash.  Neurological:     Mental Status: She is alert and oriented to person, place, and time. Mental status is at baseline.     Cranial Nerves: No cranial nerve deficit.     Coordination: Coordination normal.  Psychiatric:        Mood and Affect: Mood normal.    LABORATORY DATA:  I have reviewed the data as listed Lab Results  Component Value Date   WBC 4.9 09/02/2021   HGB 11.4 (L) 09/02/2021   HCT 34.4 (L) 09/02/2021   MCV 87.5 09/02/2021   PLT 277 09/02/2021   Recent Labs    08/18/21 0820 08/26/21 0815 09/02/21 0804  NA 136 136 135  K 3.7 3.4* 3.5  CL 99 101 97*  CO2 _0 GLUCOSE 104* 105* 128*  BUN 27* 20 22  CREATININE 0.66 0.55 0.56  CALCIUM 8.2* 8.6* 9.0  GFRNONAA >60 >60 >60  PROT 5.9* 6.3* 6.5  ALBUMIN 3.5 3.6 3.7  AST _1 ALT _2 ALKPHOS 96 102 89  BILITOT 0.6 0.9 1.3*    Iron/TIBC/Ferritin/ %Sat No results found for: IRON, TIBC, FERRITIN, IRONPCTSAT    RADIOGRAPHIC STUDIES: I have personally reviewed the radiological images as listed and agreed with the findings in the report. US Venous Img Lower Unilateral Left  Result Date: 08/28/2021 CLINICAL DATA:  Lower extremity swelling for 2 weeks EXAM: LEFT LOWER EXTREMITY VENOUS DOPPLER ULTRASOUND TECHNIQUE: Gray-scale sonography with graded compression, as well as color Doppler and duplex ultrasound were performed to evaluate the lower extremity  deep venous systems from the level of the common femoral vein and including the common femoral, femoral, profunda femoral, popliteal and calf veins including the posterior tibial, peroneal and gastrocnemius veins when visible. The superficial great saphenous vein was also interrogated. Spectral Doppler was utilized to evaluate flow at rest and with distal augmentation maneuvers in the common femoral, femoral and popliteal veins. COMPARISON:  None. FINDINGS: Contralateral Common Femoral Vein: Respiratory phasicity is normal and symmetric with the symptomatic side. No evidence of thrombus. Normal compressibility. Common Femoral Vein: No evidence of thrombus. Normal compressibility, respiratory phasicity and response to augmentation. Saphenofemoral Junction: No evidence of thrombus. Normal compressibility and flow on color Doppler imaging. Profunda Femoral Vein: No evidence of thrombus. Normal compressibility and flow on color Doppler imaging. Femoral Vein: No evidence of thrombus. Normal compressibility, respiratory phasicity and response to augmentation. Popliteal Vein: No evidence of thrombus. Normal compressibility, respiratory phasicity and response to augmentation. Calf Veins: No evidence of thrombus. Normal compressibility and flow on color Doppler imaging. IMPRESSION: No evidence of deep venous thrombosis. Electronically Signed   By: Jerilynn Mages.  Shick M.D.   On: 08/28/2021 10:06      ASSESSMENT & PLAN:  1. Metastatic breast cancer ([place])   2. Bilateral pleural effusion   3. Encounter for antineoplastic chemotherapy   4. Metastasis to bone ([place])   5. Neoplasm related pain    # Metastatic Breast cancer with extensive thoracic and bone involvement, in visceral crisis, ER+, PR+ HER2 neg MRI brain is negative Patient tolerated Taxol treatments well.  Minimal neuropathy symptoms. Labs reviewed and discussed with patient.  Proceed with Taxol today. Proceed with Taxol today.  Plan to repeat CT for  evaluation of  treatment response.  Scheduled on 09/03/2021. Pending on response, will decide whether to further continue Taxol for a few cycles or to switch to endocrine therapy plus CDK 4/5 treatments.  #Extensive bone metastasis.  Status post palliative radiation to lumbar/sacrum Currently on radiation to thoracic spine and cervical spine. Has been tapered off dexamethasone. Discussed with patient about rationale and potential side effects of Zometa.  She agrees with the plan.  Proceed with Zometa today.  # Neoplasm related pain,  Pain has improved.  She is currently off oxycodone.  #Anxiety and insomnia, continue low-dose Ativan 0.5 mg QHS PRN  #Left lower extremity swelling, no DVT on ultrasound.  Encourage leg elevation. All questions were answered. The patient knows to call the clinic with any problems questions or concerns.  cc Gayland Curry, MD    Follow up in 1 week  lab MD Taxol treatments.   [name], MD, PhD  09/02/2021

## 2021-09-03 ENCOUNTER — Inpatient Hospital Stay: Payer: Medicare PPO

## 2021-09-03 ENCOUNTER — Ambulatory Visit: Payer: Medicare PPO | Admitting: Radiation Oncology

## 2021-09-03 ENCOUNTER — Ambulatory Visit
Admission: RE | Admit: 2021-09-03 | Discharge: 2021-09-03 | Disposition: A | Discharge status: Home / Self Care | Payer: Medicare PPO | Source: Ambulatory Visit | Attending: Oncology | Admitting: Oncology

## 2021-09-03 DIAGNOSIS — C50919 Malignant neoplasm of unspecified site of unspecified female breast: Secondary | ICD-10-CM | POA: Insufficient documentation

## 2021-09-03 DIAGNOSIS — R590 Localized enlarged lymph nodes: Secondary | ICD-10-CM | POA: Diagnosis not present

## 2021-09-03 DIAGNOSIS — C7951 Secondary malignant neoplasm of bone: Secondary | ICD-10-CM | POA: Insufficient documentation

## 2021-09-03 DIAGNOSIS — C787 Secondary malignant neoplasm of liver and intrahepatic bile duct: Secondary | ICD-10-CM | POA: Insufficient documentation

## 2021-09-03 DIAGNOSIS — R918 Other nonspecific abnormal finding of lung field: Secondary | ICD-10-CM | POA: Insufficient documentation

## 2021-09-03 DIAGNOSIS — Z5111 Encounter for antineoplastic chemotherapy: Secondary | ICD-10-CM | POA: Diagnosis not present

## 2021-09-03 DIAGNOSIS — Z95828 Presence of other vascular implants and grafts: Secondary | ICD-10-CM

## 2021-09-03 LAB — CANCER ANTIGEN 15-3: CA 15-3: 266 U/mL — ABNORMAL HIGH (ref 0.0–25.0)

## 2021-09-03 LAB — CANCER ANTIGEN 27.29: CA 27.29: 298.2 U/mL — ABNORMAL HIGH (ref 0.0–38.6)

## 2021-09-03 IMAGING — CT CT CHEST-ABD-PELV W/ CM
3 of 6 series · 8 of 36 positions shown, 14 images · IV contrast (omnipaque)
Comparison: CT [DATE]

CLINICAL DATA: Restaging LEFT breast cancer with diagnosis [A3].
Surveillance exam. Bone metastasis

EXAM:
CT CHEST, ABDOMEN, AND PELVIS WITH CONTRAST
TECHNIQUE: Multidetector CT imaging of the chest, abdomen and pelvis was
performed following the standard protocol during bolus
administration of intravenous contrast.
CONTRAST:  100mL OMNIPAQUE IOHEXOL 350 MG/ML SOLN

[Series 2: axials cap 5.00 · axial · 0.70mm/px · z∈[-1436,-1046]mm · 4 of 130 slices shown, 9 images]
[im 26/130  mediastinal]
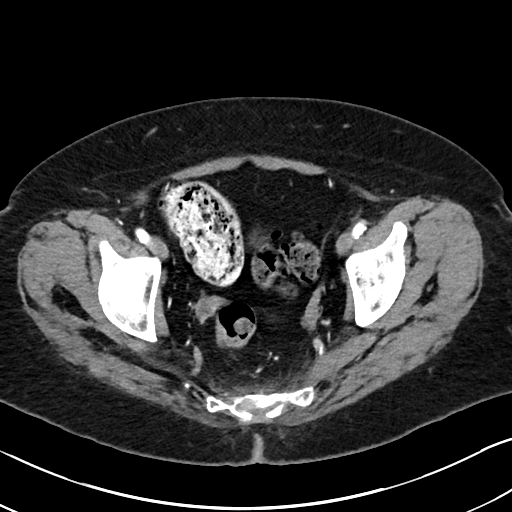
[im 26/130  lung]
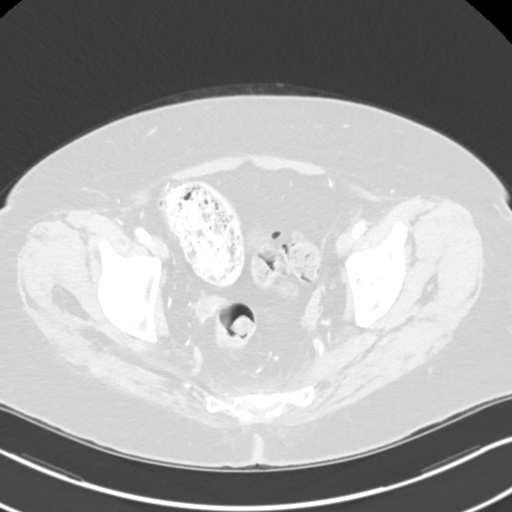
[im 26/130  bone]
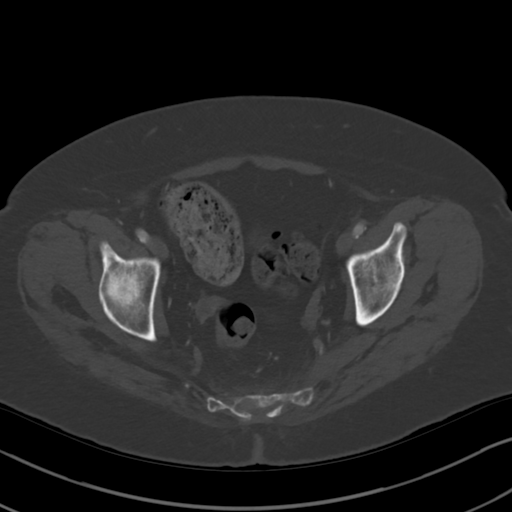
[im 52/130  mediastinal]
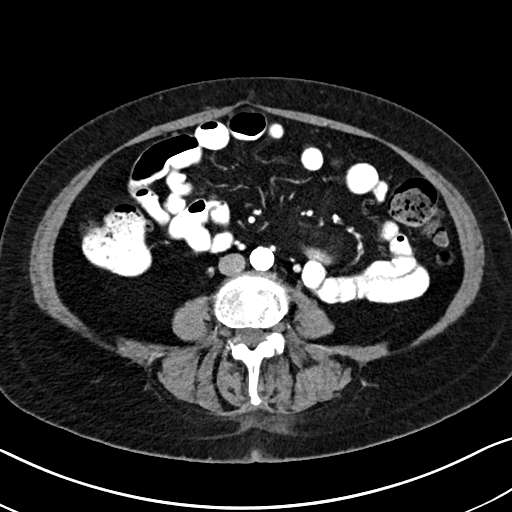
[im 52/130  lung]
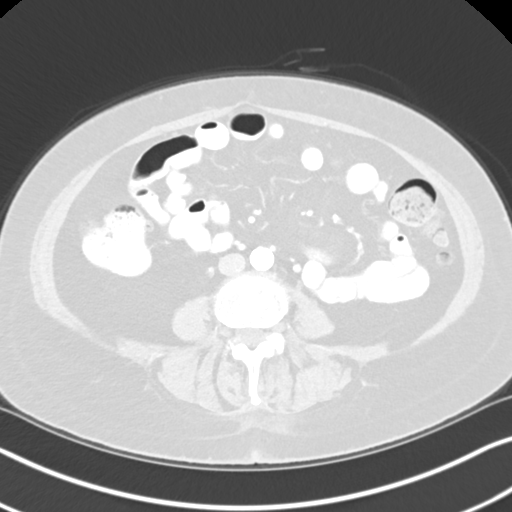
[im 78/130  mediastinal]
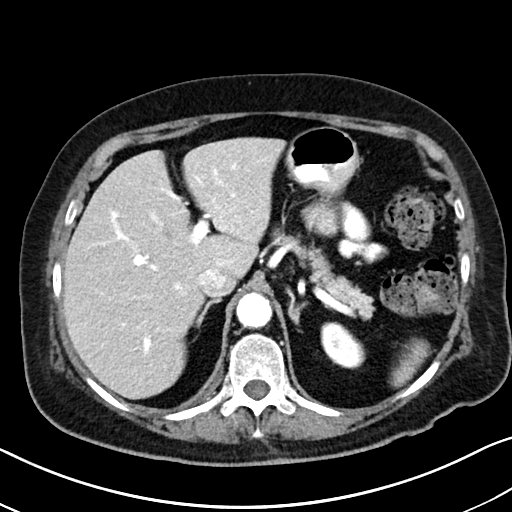
[im 78/130  lung]
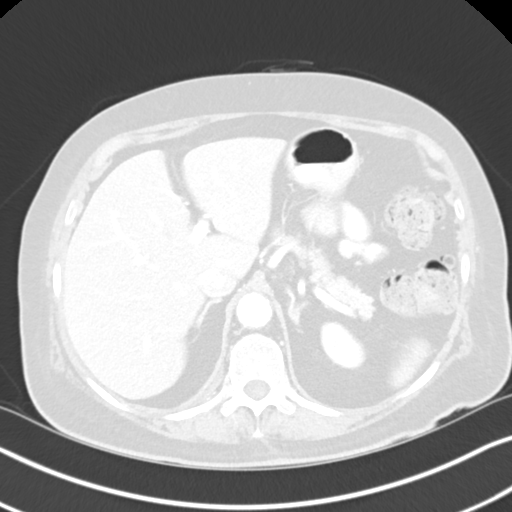
[im 104/130  mediastinal]
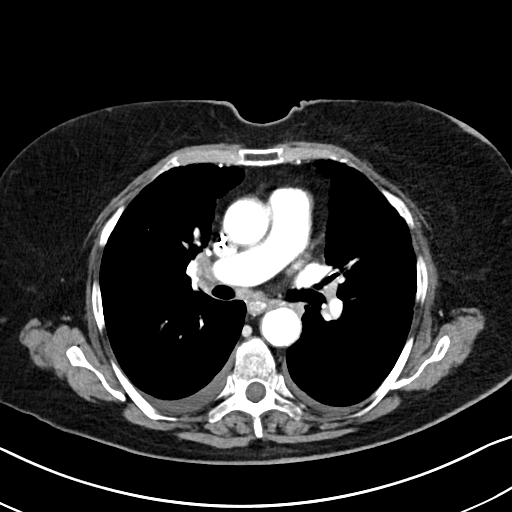
[im 104/130  lung]
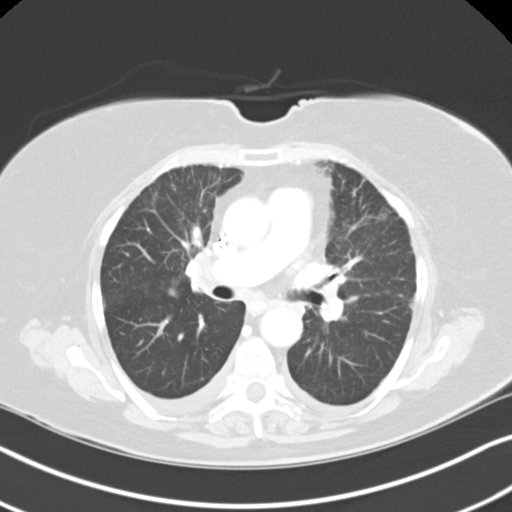

[Series 4: lungs cap 2.00 · axial · 0.70mm/px · 1 of 327 slices shown]
[im 26/327  mediastinal]
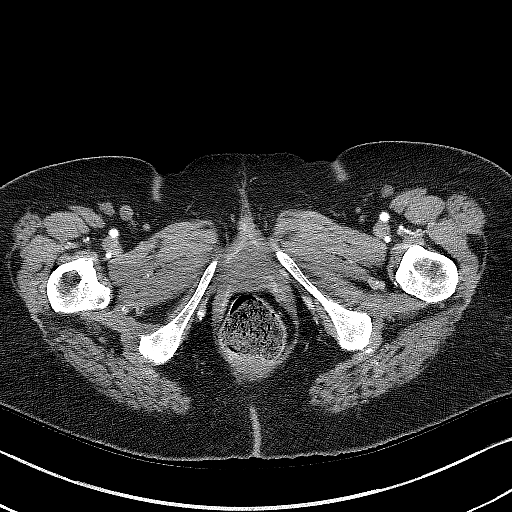

[Series 5: coronals cap 2.00 cor · coronal · 0.70mm/px · 3 of 145 slices shown, 4 images]
[im 29/145  mediastinal]
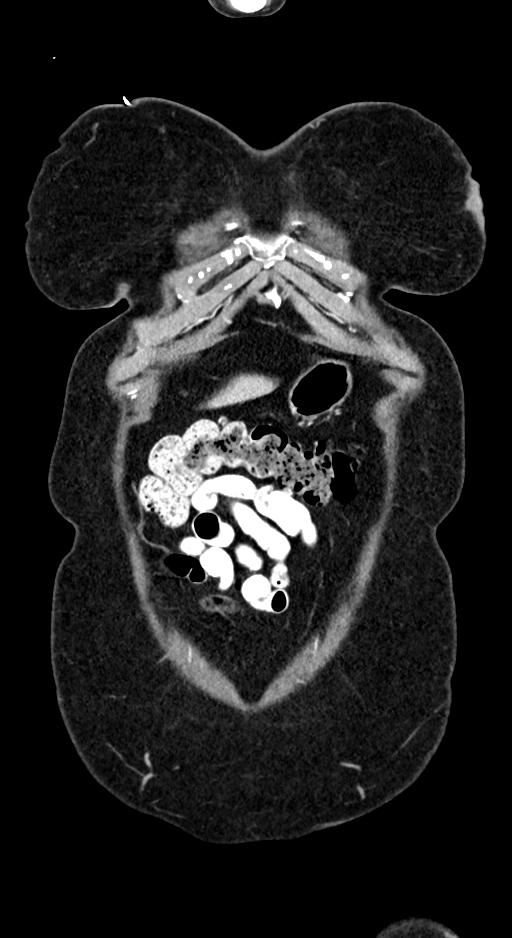
[im 58/145  mediastinal]
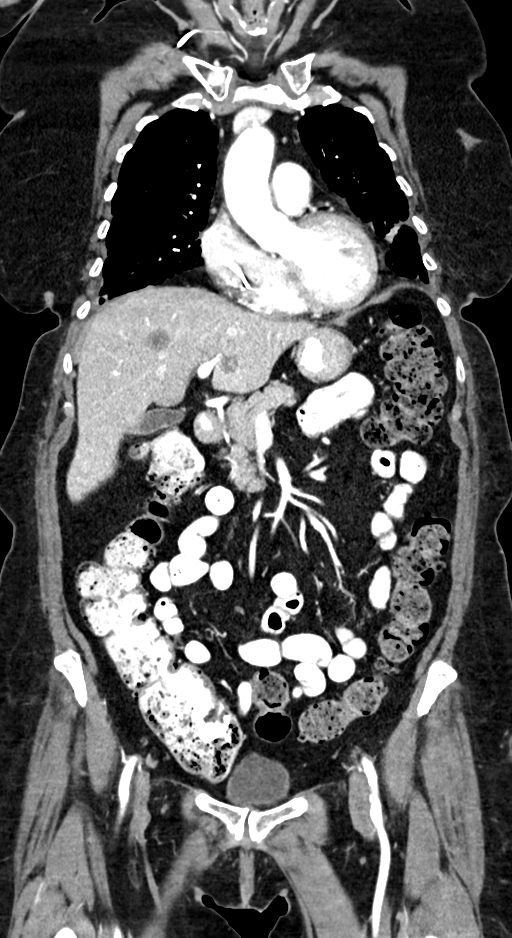
[im 58/145  bone]
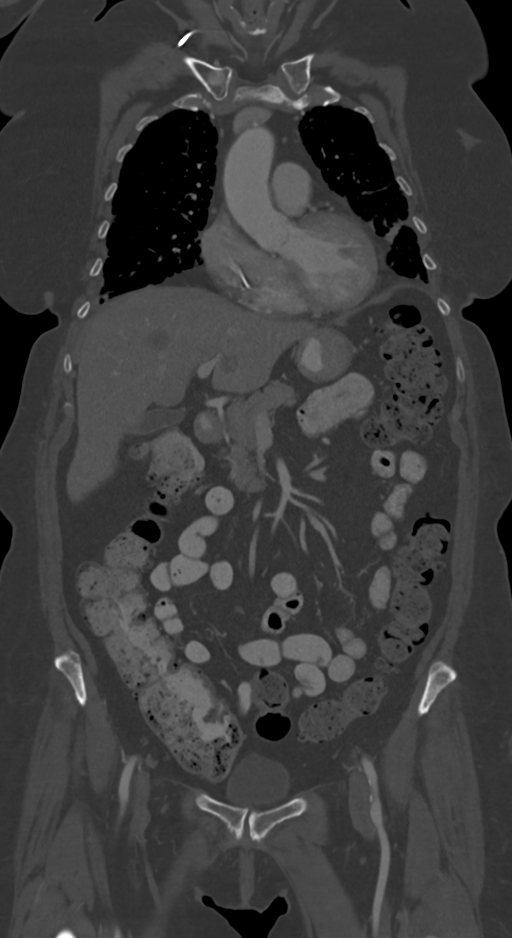
[im 87/145  mediastinal]
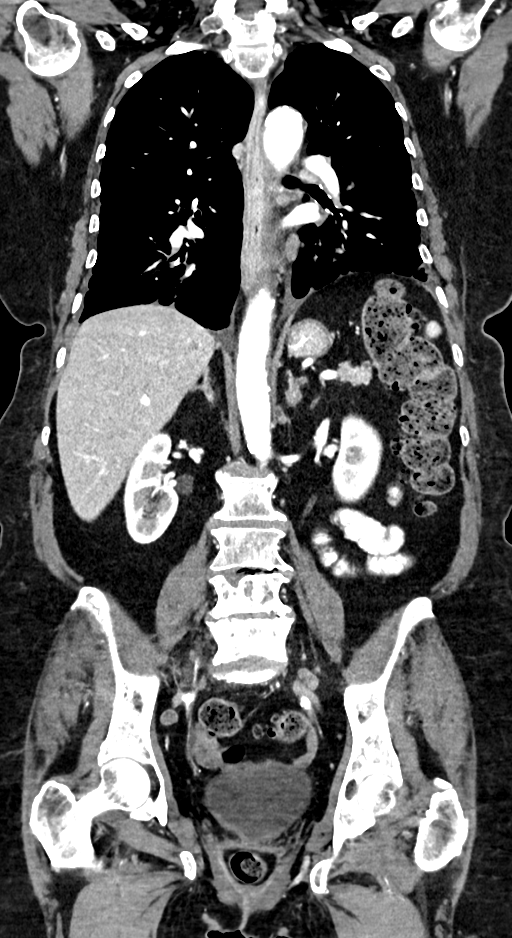

[8 of 36 positions shown; findings below may reference images not displayed]

FINDINGS: CT CHEST FINDINGS

Cardiovascular: Port in the anterior chest wall with tip in distal
SVC. No pulmonary embolism.

Mediastinum/Nodes: RIGHT lower paratracheal node measuring 12 mm is
decreased from 17 mm. Subcarinal node measuring 8 mm decreased from
15 mm. RIGHT hilar node measuring approximately 10 mm decreased from
18 mm no new adenopathy.

Lungs/Pleura: Previous seen extensive pulmonary nodularity and
interstitial thickening in the RIGHT middle lobe in lingula has
improved. Interstitial thickening is less prominent. Individual
pulmonary nodules are decreased in size. For example nodule along
the RIGHT oblique fissure measuring 5 mm (image 66/3 decreased from
7 mm. Nodularity along the LEFT oblique fissure measuring 2 mm
decreased from 3 mm. No new nodularity.

Musculoskeletal: Multiple sites of sclerotic metastasis

Increased sclerosis at previous sites metastatic skeletal disease in
the thoracic spine and sternum. For example new sclerotic lesion in
the T7 vertebral body measuring 15 mm. Increased sclerosis within
the T4 vertebral body.

CT ABDOMEN AND PELVIS FINDINGS

Hepatobiliary: Hypoenhancing lesions in liver again noted. Example
lesion in the LEFT hepatic lobe along the falciform ligament
measuring 10 mm (image 54/2) compares to 9 mm. Lesion in the RIGHT
hepatic lobe inferiorly measuring 16 mm (image 62/2 compares to 14
mm. Lesion are more defined. No new lesions are present.

Pancreas: No pancreatic

Spleen: Normal

Adrenals/urinary tract: Thickening of the adrenal glands is similar.
LEFT adrenal gland measures 13 mm in thickness compared to 14 mm.

Stomach/Bowel: Stomach, small bowel, appendix, and cecum are normal.
The colon and rectosigmoid colon are normal.

Vascular/Lymphatic: Abdominal aorta is normal caliber. There is no
retroperitoneal or periportal lymphadenopathy. No pelvic
lymphadenopathy.

Reproductive: Uterus and adnexa normal.

Other: No peritoneal metastasis

Musculoskeletal: Interval increase in sclerosis of the diffuse
metastasis within the sacrum. Sclerotic lesions in the iliac wings
are slightly increased density
IMPRESSION: Chest Impression:

1. Improvement in the pulmonary nodularity an dinitial thickening in
the LEFT and RIGHT lung. Persistent nodular interstitial and pleural
thickening remains.
2. Improvement in mediastinal and hilar adenopathy.
3. Interval increase in density of sclerotic bone metastasis. Favor
positive treatment response.

Abdomen / Pelvis Impression:

1. Hepatic metastatic lesions are similar. Lesions are slightly more
conspicuous which may be treatment effect or phase of contrast.
2. Adrenal gland metastasis similar.
3. Interval increase in sclerosis of skeletal metastasis in the
pelvis and lumbar spine.

## 2021-09-03 MED ORDER — IOHEXOL 350 MG/ML SOLN
100.0000 mL | Freq: Once | INTRAVENOUS | Status: AC | PRN
  Administered 2021-09-03: 100 mL via INTRAVENOUS

## 2021-09-03 MED ORDER — SODIUM CHLORIDE 0.9% FLUSH
10.0000 mL | Freq: Once | INTRAVENOUS | Status: AC
  Administered 2021-09-03: 10 mL via INTRAVENOUS
  Filled 2021-09-03: qty 10

## 2021-09-03 MED ORDER — HEPARIN SOD (PORK) LOCK FLUSH 100 UNIT/ML IV SOLN
500.0000 [IU] | Freq: Once | INTRAVENOUS | Status: AC
  Administered 2021-09-03: 500 [IU] via INTRAVENOUS
  Filled 2021-09-03: qty 5

## 2021-09-09 ENCOUNTER — Encounter: Payer: Self-pay | Admitting: Oncology

## 2021-09-09 ENCOUNTER — Telehealth: Payer: Medicare PPO

## 2021-09-09 ENCOUNTER — Inpatient Hospital Stay: Payer: Medicare PPO

## 2021-09-09 ENCOUNTER — Inpatient Hospital Stay (HOSPITAL_BASED_OUTPATIENT_CLINIC_OR_DEPARTMENT_OTHER): Payer: Medicare PPO | Admitting: Oncology

## 2021-09-09 ENCOUNTER — Other Ambulatory Visit: Payer: Self-pay

## 2021-09-09 VITALS — BP 116/76 | HR 93 | Temp 97.5°F | Wt 175.7 lb

## 2021-09-09 DIAGNOSIS — J90 Pleural effusion, not elsewhere classified: Secondary | ICD-10-CM

## 2021-09-09 DIAGNOSIS — G893 Neoplasm related pain (acute) (chronic): Secondary | ICD-10-CM

## 2021-09-09 DIAGNOSIS — C50919 Malignant neoplasm of unspecified site of unspecified female breast: Secondary | ICD-10-CM | POA: Diagnosis not present

## 2021-09-09 DIAGNOSIS — C7951 Secondary malignant neoplasm of bone: Secondary | ICD-10-CM

## 2021-09-09 DIAGNOSIS — Z5111 Encounter for antineoplastic chemotherapy: Secondary | ICD-10-CM

## 2021-09-09 DIAGNOSIS — M7989 Other specified soft tissue disorders: Secondary | ICD-10-CM

## 2021-09-09 LAB — CBC WITH DIFFERENTIAL/PLATELET
Abs Immature Granulocytes: 0.2 10*3/uL — ABNORMAL HIGH (ref 0.00–0.07)
Basophils Absolute: 0 10*3/uL (ref 0.0–0.1)
Basophils Relative: 1 %
Eosinophils Absolute: 0.1 10*3/uL (ref 0.0–0.5)
Eosinophils Relative: 2 %
HCT: 32.2 % — ABNORMAL LOW (ref 36.0–46.0)
Hemoglobin: 10.7 g/dL — ABNORMAL LOW (ref 12.0–15.0)
Immature Granulocytes: 4 %
Lymphocytes Relative: 6 %
Lymphs Abs: 0.3 10*3/uL — ABNORMAL LOW (ref 0.7–4.0)
MCH: 29.2 pg (ref 26.0–34.0)
MCHC: 33.2 g/dL (ref 30.0–36.0)
MCV: 87.7 fL (ref 80.0–100.0)
Monocytes Absolute: 0.5 10*3/uL (ref 0.1–1.0)
Monocytes Relative: 11 %
Neutro Abs: 3.6 10*3/uL (ref 1.7–7.7)
Neutrophils Relative %: 76 %
Platelets: 276 10*3/uL (ref 150–400)
RBC: 3.67 MIL/uL — ABNORMAL LOW (ref 3.87–5.11)
RDW: 18.6 % — ABNORMAL HIGH (ref 11.5–15.5)
WBC: 4.7 10*3/uL (ref 4.0–10.5)
nRBC: 1.5 % — ABNORMAL HIGH (ref 0.0–0.2)

## 2021-09-09 LAB — COMPREHENSIVE METABOLIC PANEL
ALT: 22 U/L (ref 0–44)
AST: 27 U/L (ref 15–41)
Albumin: 3.6 g/dL (ref 3.5–5.0)
Alkaline Phosphatase: 73 U/L (ref 38–126)
Anion gap: 9 (ref 5–15)
BUN: 22 mg/dL (ref 8–23)
CO2: 26 mmol/L (ref 22–32)
Calcium: 8.6 mg/dL — ABNORMAL LOW (ref 8.9–10.3)
Chloride: 99 mmol/L (ref 98–111)
Creatinine, Ser: 0.61 mg/dL (ref 0.44–1.00)
GFR, Estimated: >60 mL/min (ref >60)
Glucose, Bld: 126 mg/dL — ABNORMAL HIGH (ref 70–99)
Potassium: 3 mmol/L — ABNORMAL LOW (ref 3.5–5.1)
Sodium: 134 mmol/L — ABNORMAL LOW (ref 135–145)
Total Bilirubin: 1 mg/dL (ref 0.3–1.2)
Total Protein: 6.3 g/dL — ABNORMAL LOW (ref 6.5–8.1)

## 2021-09-09 MED ORDER — HEPARIN SOD (PORK) LOCK FLUSH 100 UNIT/ML IV SOLN
INTRAVENOUS | Status: AC
  Administered 2021-09-09: 500 [IU]
  Filled 2021-09-09: qty 5

## 2021-09-09 MED ORDER — POTASSIUM CHLORIDE CRYS ER 20 MEQ PO TBCR: 20.0000 meq | EXTENDED_RELEASE_TABLET | Freq: Two times a day (BID) | ORAL | 0 refills | Status: DC

## 2021-09-09 MED ORDER — SODIUM CHLORIDE 0.9 % IV SOLN
75.0000 mg/m2 | Freq: Once | INTRAVENOUS | Status: AC
  Administered 2021-09-09: 144 mg via INTRAVENOUS
  Filled 2021-09-09: qty 24

## 2021-09-09 MED ORDER — SODIUM CHLORIDE 0.9 % IV SOLN
Freq: Once | INTRAVENOUS | Status: AC
  Filled 2021-09-09: qty 250

## 2021-09-09 MED ORDER — DIPHENHYDRAMINE HCL 50 MG/ML IJ SOLN
25.0000 mg | Freq: Once | INTRAMUSCULAR | Status: AC
  Administered 2021-09-09: 25 mg via INTRAVENOUS
  Filled 2021-09-09: qty 1

## 2021-09-09 MED ORDER — SODIUM CHLORIDE 0.9% FLUSH
10.0000 mL | INTRAVENOUS | Status: DC | PRN
  Filled 2021-09-09: qty 10

## 2021-09-09 MED ORDER — FAMOTIDINE 20 MG IN NS 100 ML IVPB
20.0000 mg | Freq: Once | INTRAVENOUS | Status: AC
  Administered 2021-09-09: 20 mg via INTRAVENOUS
  Filled 2021-09-09: qty 20

## 2021-09-09 MED ORDER — HEPARIN SOD (PORK) LOCK FLUSH 100 UNIT/ML IV SOLN
500.0000 [IU] | Freq: Once | INTRAVENOUS | Status: AC | PRN
  Filled 2021-09-09: qty 5

## 2021-09-09 MED ORDER — SODIUM CHLORIDE 0.9 % IV SOLN
10.0000 mg | Freq: Once | INTRAVENOUS | Status: AC
  Administered 2021-09-09: 10 mg via INTRAVENOUS
  Filled 2021-09-09: qty 10

## 2021-09-09 NOTE — Progress Notes (Signed)
Nutrition Follow-up: ° °Patient with metastatic breast cancer with tumor in lumbar vertebrae, pulmonary nodules, hepatic and bilateral adrenal gland, metastasis, bone mets.  Patient receiving chemotherapy and has completed radiation  ° °Met with patient during infusion.  Patient reports that her appetite is good, better.  Reports that swallowing is better which has effected intake volume.  Reports that she is getting about 80-90 g per day.  Drinks 1, 30g protein shake daily. Eats mainly meats, eggs, peanut butter, protein bars.  Denies nutrition impact symptoms.   ° ° °Medications: reviewed ° °Labs: reviewed ° °Anthropometrics:  ° °Weight 175 lb 11 oz today ° °179 lb 4.8 oz 11/29 ° °180 lb UBW ° °3% weight loss ° °NUTRITION DIAGNOSIS: Inadequate oral intake related to dysphagia as evidenced by 3% weight loss.  ° ° °INTERVENTION:  °Encouraged patient to continue to strive for protein goal each day. °Can increase shake to 2 per day as option.  Patient does not think she can do this.   °  ° °MONITORING, EVALUATION, GOAL: weight trends, intake ° ° °NEXT VISIT: telephone visit, Jan 10  ° °Joli B. Allen, RD, LDN °Registered Dietitian °[phone] (mobile) ° ° °

## 2021-09-09 NOTE — Progress Notes (Signed)
Hematology/Oncology follow up note [phone] [phone]   Patient Care Team: Gayland Curry, MD as PCP - General (Family Medicine) Noreene Filbert, MD as Consulting Physician (Radiation Oncology)  REFERRING PROVIDER: Gayland Curry, MD  CHIEF COMPLAINTS/REASON FOR VISIT:  metastatic breast caner  HISTORY OF PRESENTING ILLNESS:   Amber Blevins is a  70 y.o.  female with PMH listed below was seen in consultation at the request of  Gayland Curry, MD  for evaluation of tumor in lumbar vertebrae  # patient reports feeling tightness and pain in Feb 2022. She was started on conservative management which did not help her symptoms. She also started to experience numbness.  She takes gabapentin which partially relieve the discomfort. She denies bowel or bladder incontinence, no lower extremity weakness.   06/25/2021 MRI lumbar spine IMPRESSION:  1. 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 Additional metastatic involvement T12, L1 through L3.  No primary tumor site identified. Top differential considerations are Metastatic Disease Unknown primary, less likely Lymphoma or Multiple Myeloma.   2. Superimposed lumbar spine degeneration, including degenerative moderate to severe left L3 and L4 nerve level impingement from disc herniation.    Patient was seen by neurosurgeon Dr.Yarbrough today. He has ordered MRI cervical and thoracic spine and sacrum biopsy for further work up.   Patient has a history of breast cancer, diagnosed in 2010 Left-sided T2 (2.4cm) N0, ER/PR positive, HER2 negative IDC of the breast, s/p lumpectomy by Dr.Meyer at [facility]. Oncotype score of 11 who completed radiation and she finished 10 years  of Femara from 05/2009 and stopped in 2020.  she has intentionally lost some weight, no night sweating, fever.   # 07/02/2021, CT chest abdomen pelvis showed innumerable small pulmonary and pleural nodules consistent with diffuse metastasis.  Associated with probable malignant pleural effusion.  Mediastinal and hilar lymphadenopathy consistent with metastatic disease.  Hepatic and bilateral adrenal gland metastasis.  Diffuse extensive destructive metastatic bone disease involving pelvis.   07/11/2021 - 07/14/2021  Patient was admitted to hospital due to shortness of breath. 07/12/2021, CT chest PE protocol showed no PE.  Further increase of March pleural effusion and admit to moderate interstitial pulmonary edema.  Bulky mediastinal and hilar lymphadenopathy.  Unchanged.  Incompletely visualized left adrenal nodule. 07/12/2021 patient underwent right thoracentesis.  Cytology was positive for metastatic carcinoma, compatible with breast origin.  ER/PR/HER2 status is pending.  07/16/2021, patient underwent iliac bone biopsy.  Positive for metastatic carcinoma. 07/25/2021, patient underwent thoracentesis of right side, removed 550 cc straw-colored pleural fluid.  She tolerated procedure well. Today she reports shortness of breath with exertion has slightly improved.  She uses home oxygen 2 L  INTERVAL HISTORY Amber Blevins is a 70 y.o. female who has above history reviewed by me today presents for follow up visit for management of metastatic breast cancer . Patient reports feeling well. She continues to have numbness tingling of fingertips and toes.  She takes gabapentin 600 mg twice daily.  She has not been constantly trying 3 times daily. Off oxygen.  Breathing status is stable.  Pain has improved.  She is no longer on narcotics.   Normal, Review of Systems  Constitutional:  Negative for appetite change, chills, fatigue and fever.  HENT:   Negative for hearing loss and  voice change.    Eyes:  Negative for eye problems.  Respiratory:  Negative for chest tightness and cough.   Cardiovascular:  Negative for chest pain.  Gastrointestinal:  Negative for abdominal distention, abdominal pain and blood in stool.  Endocrine: Negative for hot flashes.  Genitourinary:  Negative for difficulty urinating and frequency.   Musculoskeletal:  Positive for back pain. Negative for arthralgias.       Lower back/sacrum pain and numbness  Skin:  Negative for itching and rash.  Neurological:  Negative for extremity weakness.  Hematological:  Negative for adenopathy.  Psychiatric/Behavioral:  Negative for confusion.    MEDICAL HISTORY:  Past Medical History:  Diagnosis Date   Breast cancer ([place])    COPD (chronic obstructive pulmonary disease) ([place])    Family history of adverse reaction to anesthesia    brother has problem with coming out of anesthesia   High cholesterol    Hypertension    Personal history of radiation therapy    Pre-diabetes     SURGICAL HISTORY: Past Surgical History:  Procedure Laterality Date   BREAST LUMPECTOMY Left    2010   COLONOSCOPY     EYE SURGERY Left    Cataract surgery   PORTACATH PLACEMENT N/A 08/01/2021   Procedure: INSERTION PORT-A-CATH;  Surgeon: Herbert Pun, MD;  Location: [facility] ORS;  Service: General;  Laterality: N/A;   thorocentesis Right 07/10/2021   and another on 07/25/21    SOCIAL HISTORY: Social History   Socioeconomic History   Marital status: Married    Spouse name: Not on file   Number of children: Not on file   Years of education: Not on file   Highest education level: Not on file  Occupational History   Not on file  Tobacco Use   Smoking status: Former    Packs/day: 1.00    Years: 25.00    Pack years: 25.00    Types: Cigarettes    Quit date: 91    Years since quitting: 32.9   Smokeless tobacco: Never  Vaping Use   Vaping Use: Never used  Substance and Sexual Activity   Alcohol use: Not Currently    Drug use: Never   Sexual activity: Not on file  Other Topics Concern   Not on file  Social History Narrative   Not on file   Social Determinants of Health   Financial Resource Strain: Not on file  Food Insecurity: Not on file  Transportation Needs: Not on file  Physical Activity: Not on file  Stress: Not on file  Social Connections: Not on file  Intimate Partner Violence: Not on file    FAMILY HISTORY: Family History  Problem Relation Age of Onset   Cancer Mother        gynecological   Lung cancer Mother    Diabetes Father    Heart disease Father    Parkinson's disease Father    Brain cancer Brother    Bladder Cancer Brother    Pulmonary disease Brother    Rheumatic fever Brother    Breast cancer Neg Hx     ALLERGIES:  is allergic to green tea (camellia sinensis), melaleuca viridiflora, and lisinopril.  MEDICATIONS:  Current Outpatient Medications  Medication Sig Dispense Refill   acetaminophen (TYLENOL) 650 MG CR tablet Take 650 mg by mouth every 8 (eight) hours as needed for pain.     albuterol (VENTOLIN HFA) 108 (90 Base) MCG/ACT inhaler Inhale 2 puffs into the lungs every 6 (six) hours as  needed.     aspirin 81 MG EC tablet Take by mouth.     atorvastatin (LIPITOR) 40 MG tablet Take 40 mg by mouth at bedtime.     Boswellia-Glucosamine-Vit D (OSTEO BI-FLEX-GLUCOS/5-LOXIN) TABS Take 1 capsule by mouth in the morning and at bedtime.     Calcium Carb-Cholecalciferol 600-400 MG-UNIT TABS Take 1 tablet by mouth daily.     Fluticasone-Umeclidin-Vilant (TRELEGY ELLIPTA) 100-62.5-25 MCG/INH AEPB Inhale into the lungs. Inhale 1 inhalation into the lungs once daily     gabapentin (NEURONTIN) 300 MG capsule Take 2 capsules by mouth 2 (two) times daily.     ibuprofen (ADVIL) 200 MG tablet Take by mouth. Take 400 mg by mouth every six (6) hours as needed for pain.     lidocaine-prilocaine (EMLA) cream Apply 1 application topically as needed. 30 g 5   Loperamide HCl (IMODIUM PO)  Take by mouth.     LORazepam (ATIVAN) 0.5 MG tablet Take 1 tablet (0.5 mg total) by mouth at bedtime as needed for anxiety. 30 tablet 0   magnesium chloride (SLOW-MAG) 64 MG TBEC SR tablet Take 1 tablet by mouth at bedtime.     metoprolol-hydrochlorothiazide (LOPRESSOR HCT) 50-25 MG tablet Take 1 tablet by mouth daily. Take 1 tablet by mouth once daily     ondansetron (ZOFRAN) 8 MG tablet Take 1 tablet (8 mg total) by mouth 2 (two) times daily as needed (Nausea or vomiting). 30 tablet 1   oxyCODONE (OXY IR/ROXICODONE) 5 MG immediate release tablet Take 1 tablet (5 mg total) by mouth every 12 (twelve) hours as needed for severe pain or moderate pain. 60 tablet 0   prochlorperazine (COMPAZINE) 10 MG tablet Take 1 tablet (10 mg total) by mouth every 6 (six) hours as needed (Nausea or vomiting). 30 tablet 1   sucralfate (CARAFATE) 1 GM/10ML suspension Take 10 mLs (1 g total) by mouth 3 (three) times daily. 420 mL 2   Turmeric (QC TUMERIC COMPLEX PO) Take 1 capsule by mouth at bedtime.     Docusate Sodium (DSS) 100 MG CAPS Take 1 capsule by mouth every other day. (Patient not taking: Reported on 08/26/2021)     potassium chloride SA (KLOR-CON M) 20 MEQ tablet Take 1 tablet (20 mEq total) by mouth 2 (two) times daily. 60 tablet 0   No current facility-administered medications for this visit.     PHYSICAL EXAMINATION: ECOG PERFORMANCE STATUS: 1 - Symptomatic but completely ambulatory Vitals:   09/09/21 0843  BP: 116/76  Pulse: 93  Temp: (!) 97.5 F (36.4 C)   Filed Weights   09/09/21 0843  Weight: 175 lb 11.2 oz (79.7 kg)    Physical Exam Constitutional:      General: She is not in acute distress. HENT:     Head: Normocephalic and atraumatic.  Eyes:     General: No scleral icterus. Cardiovascular:     Rate and Rhythm: Normal rate and regular rhythm.     Heart sounds: Normal heart sounds.  Pulmonary:     Effort: Pulmonary effort is normal. No respiratory distress.     Breath sounds:  No wheezing.     Comments: Decreased breath sound bibasilar Abdominal:     General: Bowel sounds are normal. There is no distension.     Palpations: Abdomen is soft.  Musculoskeletal:        General: No deformity. Normal range of motion.     Cervical back: Normal range of motion and neck supple.  Comments: Left lower extremity edema 1+, no tenderness.  Skin:    General: Skin is warm and dry.     Findings: No erythema or rash.  Neurological:     Mental Status: She is alert and oriented to person, place, and time. Mental status is at baseline.     Cranial Nerves: No cranial nerve deficit.     Coordination: Coordination normal.  Psychiatric:        Mood and Affect: Mood normal.    LABORATORY DATA:  I have reviewed the data as listed Lab Results  Component Value Date   WBC 4.7 09/09/2021   HGB 10.7 (L) 09/09/2021   HCT 32.2 (L) 09/09/2021   MCV 87.7 09/09/2021   PLT 276 09/09/2021   Recent Labs    08/26/21 0815 09/02/21 0804 09/09/21 0824  NA 136 135 134*  K 3.4* 3.5 3.0*  CL 101 97* 99  CO2 30 28 26   GLUCOSE 105* 128* 126*  BUN 20 22 22   CREATININE 0.55 0.56 0.61  CALCIUM 8.6* 9.0 8.6*  GFRNONAA >60 >60 >60  PROT 6.3* 6.5 6.3*  ALBUMIN 3.6 3.7 3.6  AST 26 23 27   ALT 26 21 22   ALKPHOS 102 89 73  BILITOT 0.9 1.3* 1.0    Iron/TIBC/Ferritin/ %Sat No results found for: IRON, TIBC, FERRITIN, IRONPCTSAT    RADIOGRAPHIC STUDIES: I have personally reviewed the radiological images as listed and agreed with the findings in the report. CT CHEST ABDOMEN PELVIS W CONTRAST  Result Date: 09/04/2021 CLINICAL DATA:  Restaging LEFT breast cancer with diagnosis 2010. Surveillance exam. Bone metastasis EXAM: CT CHEST, ABDOMEN, AND PELVIS WITH CONTRAST TECHNIQUE: Multidetector CT imaging of the chest, abdomen and pelvis was performed following the standard protocol during bolus administration of intravenous contrast. CONTRAST:  161m OMNIPAQUE IOHEXOL 350 MG/ML SOLN COMPARISON:   CT 07/02/2021 FINDINGS: CT CHEST FINDINGS Cardiovascular: Port in the anterior chest wall with tip in distal SVC. No pulmonary embolism. Mediastinum/Nodes: RIGHT lower paratracheal node measuring 12 mm is decreased from 17 mm. Subcarinal node measuring 8 mm decreased from 15 mm. RIGHT hilar node measuring approximately 10 mm decreased from 18 mm no new adenopathy. Lungs/Pleura: Previous seen extensive pulmonary nodularity and interstitial thickening in the RIGHT middle lobe in lingula has improved. Interstitial thickening is less prominent. Individual pulmonary nodules are decreased in size. For example nodule along the RIGHT oblique fissure measuring 5 mm (image 66/3 decreased from 7 mm. Nodularity along the LEFT oblique fissure measuring 2 mm decreased from 3 mm. No new nodularity. Musculoskeletal: Multiple sites of sclerotic metastasis Increased sclerosis at previous sites metastatic skeletal disease in the thoracic spine and sternum. For example new sclerotic lesion in the T7 vertebral body measuring 15 mm. Increased sclerosis within the T4 vertebral body. CT ABDOMEN AND PELVIS FINDINGS Hepatobiliary: Hypoenhancing lesions in liver again noted. Example lesion in the LEFT hepatic lobe along the falciform ligament measuring 10 mm (image 54/2) compares to 9 mm. Lesion in the RIGHT hepatic lobe inferiorly measuring 16 mm (image 62/2 compares to 14 mm. Lesion are more defined. No new lesions are present. Pancreas: No pancreatic Spleen: Normal Adrenals/urinary tract: Thickening of the adrenal glands is similar. LEFT adrenal gland measures 13 mm in thickness compared to 14 mm. Stomach/Bowel: Stomach, small bowel, appendix, and cecum are normal. The colon and rectosigmoid colon are normal. Vascular/Lymphatic: Abdominal aorta is normal caliber. There is no retroperitoneal or periportal lymphadenopathy. No pelvic lymphadenopathy. Reproductive: Uterus and adnexa normal. Other: No peritoneal metastasis  Musculoskeletal:  Interval increase in sclerosis of the diffuse metastasis within the sacrum. Sclerotic lesions in the iliac wings are slightly increased density IMPRESSION: Chest Impression: 1. Improvement in the pulmonary nodularity an dinitial thickening in the LEFT and RIGHT lung. Persistent nodular interstitial and pleural thickening remains. 2. Improvement in mediastinal and hilar adenopathy. 3. Interval increase in density of sclerotic bone metastasis. Favor positive treatment response. Abdomen / Pelvis Impression: 1. Hepatic metastatic lesions are similar. Lesions are slightly more conspicuous which may be treatment effect or phase of contrast. 2. Adrenal gland metastasis similar. 3. Interval increase in sclerosis of skeletal metastasis in the pelvis and lumbar spine. Electronically Signed   By: Suzy Bouchard M.D.   On: 09/04/2021 12:43   US Venous Img Lower Unilateral Left  Result Date: 08/28/2021 CLINICAL DATA:  Lower extremity swelling for 2 weeks EXAM: LEFT LOWER EXTREMITY VENOUS DOPPLER ULTRASOUND TECHNIQUE: Gray-scale sonography with graded compression, as well as color Doppler and duplex ultrasound were performed to evaluate the lower extremity deep venous systems from the level of the common femoral vein and including the common femoral, femoral, profunda femoral, popliteal and calf veins including the posterior tibial, peroneal and gastrocnemius veins when visible. The superficial great saphenous vein was also interrogated. Spectral Doppler was utilized to evaluate flow at rest and with distal augmentation maneuvers in the common femoral, femoral and popliteal veins. COMPARISON:  None. FINDINGS: Contralateral Common Femoral Vein: Respiratory phasicity is normal and symmetric with the symptomatic side. No evidence of thrombus. Normal compressibility. Common Femoral Vein: No evidence of thrombus. Normal compressibility, respiratory phasicity and response to augmentation. Saphenofemoral Junction: No evidence of  thrombus. Normal compressibility and flow on color Doppler imaging. Profunda Femoral Vein: No evidence of thrombus. Normal compressibility and flow on color Doppler imaging. Femoral Vein: No evidence of thrombus. Normal compressibility, respiratory phasicity and response to augmentation. Popliteal Vein: No evidence of thrombus. Normal compressibility, respiratory phasicity and response to augmentation. Calf Veins: No evidence of thrombus. Normal compressibility and flow on color Doppler imaging. IMPRESSION: No evidence of deep venous thrombosis. Electronically Signed   By: Jerilynn Mages.  Shick M.D.   On: 08/28/2021 10:06      ASSESSMENT & PLAN:  1. Metastatic breast cancer ([place])   2. Bilateral pleural effusion   3. Encounter for antineoplastic chemotherapy   4. Metastasis to bone ([place])   5. Neoplasm related pain   6. Swelling of left lower extremity    # Metastatic Breast cancer with extensive thoracic and bone involvement, in visceral crisis, ER+, PR+ HER2 neg MRI brain is negative Patient tolerated Taxol treatments well.   09/04/2021, CT chest abdomen pelvis showed improvement of the pulmonary nodularity and thickening in the left and the right lung.  Persistent nodular interstitial and pleural thickening remains.  Improvement of mediastinal and hilar adenopathy, hepatic metastatic lesions are similar.  Adrenal gland metastasis is similar.  Interval increase in the sclerotic bone metastasis.  Discussed with patient that I would like to proceed with 2 additional cycles of Taxol. Meanwhile, we will look into coverage for CDK 4/5 inhibitor. Plan to switch to aromatase inhibitor plus CDK 4/5 inhibitor.  She agrees with the plan. Labs reviewed and discussed with patient.  Proceed with Taxol today.  #Grade 2 neuropathy, continue gabapentin, she may increase further to 600 mg 3 times daily. Taxol dose reduction 75 mg/m2  #Extensive bone metastasis.  Status post palliative radiation to lumbar/sacrum Status  post radiation to thoracic spine and cervical spine. Has been tapered  off dexamethasone. Status post Zometa 09/02/2021.  #Hypocalcemia secondary to Zometa use.  Continue calcium supplementation.  # Neoplasm related pain,  Pain has improved.  She is currently off oxycodone.  #Anxiety and insomnia, continue low-dose Ativan 0.5 mg QHS PRN #Left lower extremity swelling, no DVT on ultrasound.  Encourage leg elevation. All questions were answered. The patient knows to call the clinic with any problems questions or concerns.  cc Gayland Curry, MD    Follow up in 1 week  lab MD Taxol treatments.   [name], MD, PhD  09/09/2021

## 2021-09-09 NOTE — Patient Instructions (Signed)
[facility] CANCER CTR AT [place]  Discharge Instructions: Thank you for choosing [facility] to provide your oncology and hematology care.  If you have a lab appointment with the [facility], please go directly to the [facility] and check in at the registration area.  Wear comfortable clothing and clothing appropriate for easy access to any Portacath or PICC line.   We strive to give you quality time with your provider. You may need to reschedule your appointment if you arrive late (15 or more minutes).  Arriving late affects you and other patients whose appointments are after yours.  Also, if you miss three or more appointments without notifying the office, you may be dismissed from the clinic at the providers discretion.      For prescription refill requests, have your pharmacy contact our office and allow 72 hours for refills to be completed.    Today you received the following chemotherapy and/or immunotherapy agents - paclitaxel      To help prevent nausea and vomiting after your treatment, we encourage you to take your nausea medication as directed.  BELOW ARE SYMPTOMS THAT SHOULD BE REPORTED IMMEDIATELY: *FEVER GREATER THAN 100.4 F (38 C) OR HIGHER *CHILLS OR SWEATING *NAUSEA AND VOMITING THAT IS NOT CONTROLLED WITH YOUR NAUSEA MEDICATION *UNUSUAL SHORTNESS OF BREATH *UNUSUAL BRUISING OR BLEEDING *URINARY PROBLEMS (pain or burning when urinating, or frequent urination) *BOWEL PROBLEMS (unusual diarrhea, constipation, pain near the anus) TENDERNESS IN MOUTH AND THROAT WITH OR WITHOUT PRESENCE OF ULCERS (sore throat, sores in mouth, or a toothache) UNUSUAL RASH, SWELLING OR PAIN  UNUSUAL VAGINAL DISCHARGE OR ITCHING   Items with * indicate a potential emergency and should be followed up as soon as possible or go to the Emergency Department if any problems should occur.  Please show the CHEMOTHERAPY ALERT CARD or IMMUNOTHERAPY ALERT CARD at check-in  to the Emergency Department and triage nurse.  Should you have questions after your visit or need to cancel or reschedule your appointment, please contact [facility] CANCER [place] AT [place]  [phone] and follow the prompts.  Office hours are 8:00 a.m. to 4:30 p.m. Monday - Friday. Please note that voicemails left after 4:00 p.m. may not be returned until the following business day.  We are closed weekends and major holidays. You have access to a nurse at all times for urgent questions. Please call the main number to the clinic [phone] and follow the prompts.  For any non-urgent questions, you may also contact your provider using MyChart. We now offer e-Visits for anyone 26 and older to request care online for non-urgent symptoms. For details visit [email or web address].   Also download the MyChart app! Go to the app store, search "MyChart", open the app, select [facility], and log in with your MyChart username and password.  Due to Covid, a mask is required upon entering the hospital/clinic. If you do not have a mask, one will be given to you upon arrival. For doctor visits, patients may have 1 support person aged 26 or older with them. For treatment visits, patients cannot have anyone with them due to current Covid guidelines and our immunocompromised population.   Paclitaxel injection What is this medication? PACLITAXEL (PAK li TAX el) is a chemotherapy drug. It targets fast dividing cells, like cancer cells, and causes these cells to die. This medicine is used to treat ovarian cancer, breast cancer, lung cancer, Kaposi's sarcoma, and other cancers. This medicine may be used for other purposes;  ask your health care provider or pharmacist if you have questions. COMMON BRAND NAME(S): Onxol, Taxol What should I tell my care team before I take this medication? They need to know if you have any of these conditions: history of irregular heartbeat liver disease low blood counts,  like low white cell, platelet, or red cell counts lung or breathing disease, like asthma tingling of the fingers or toes, or other nerve disorder an unusual or allergic reaction to paclitaxel, alcohol, polyoxyethylated castor oil, other chemotherapy, other medicines, foods, dyes, or preservatives pregnant or trying to get pregnant breast-feeding How should I use this medication? This drug is given as an infusion into a vein. It is administered in a hospital or clinic by a specially trained health care professional. Talk to your pediatrician regarding the use of this medicine in children. Special care may be needed. Overdosage: If you think you have taken too much of this medicine contact a poison control center or emergency room at once. NOTE: This medicine is only for you. Do not share this medicine with others. What if I miss a dose? It is important not to miss your dose. Call your doctor or health care professional if you are unable to keep an appointment. What may interact with this medication? Do not take this medicine with any of the following medications: live virus vaccines This medicine may also interact with the following medications: antiviral medicines for hepatitis, HIV or AIDS certain antibiotics like erythromycin and clarithromycin certain medicines for fungal infections like ketoconazole and itraconazole certain medicines for seizures like carbamazepine, phenobarbital, phenytoin gemfibrozil nefazodone rifampin St. John's wort This list may not describe all possible interactions. Give your health care provider a list of all the medicines, herbs, non-prescription drugs, or dietary supplements you use. Also tell them if you smoke, drink alcohol, or use illegal drugs. Some items may interact with your medicine. What should I watch for while using this medication? Your condition will be monitored carefully while you are receiving this medicine. You will need important blood work  done while you are taking this medicine. This medicine can cause serious allergic reactions. To reduce your risk you will need to take other medicine(s) before treatment with this medicine. If you experience allergic reactions like skin rash, itching or hives, swelling of the face, lips, or tongue, tell your doctor or health care professional right away. In some cases, you may be given additional medicines to help with side effects. Follow all directions for their use. This drug may make you feel generally unwell. This is not uncommon, as chemotherapy can affect healthy cells as well as cancer cells. Report any side effects. Continue your course of treatment even though you feel ill unless your doctor tells you to stop. Call your doctor or health care professional for advice if you get a fever, chills or sore throat, or other symptoms of a cold or flu. Do not treat yourself. This drug decreases your body's ability to fight infections. Try to avoid being around people who are sick. This medicine may increase your risk to bruise or bleed. Call your doctor or health care professional if you notice any unusual bleeding. Be careful brushing and flossing your teeth or using a toothpick because you may get an infection or bleed more easily. If you have any dental work done, tell your dentist you are receiving this medicine. Avoid taking products that contain aspirin, acetaminophen, ibuprofen, naproxen, or ketoprofen unless instructed by your doctor. These medicines may hide  a fever. Do not become pregnant while taking this medicine. Women should inform their doctor if they wish to become pregnant or think they might be pregnant. There is a potential for serious side effects to an unborn child. Talk to your health care professional or pharmacist for more information. Do not breast-feed an infant while taking this medicine. Men are advised not to father a child while receiving this medicine. This product may  contain alcohol. Ask your pharmacist or healthcare provider if this medicine contains alcohol. Be sure to tell all healthcare providers you are taking this medicine. Certain medicines, like metronidazole and disulfiram, can cause an unpleasant reaction when taken with alcohol. The reaction includes flushing, headache, nausea, vomiting, sweating, and increased thirst. The reaction can last from 30 minutes to several hours. What side effects may I notice from receiving this medication? Side effects that you should report to your doctor or health care professional as soon as possible: allergic reactions like skin rash, itching or hives, swelling of the face, lips, or tongue breathing problems changes in vision fast, irregular heartbeat high or low blood pressure mouth sores pain, tingling, numbness in the hands or feet signs of decreased platelets or bleeding - bruising, pinpoint red spots on the skin, black, tarry stools, blood in the urine signs of decreased red blood cells - unusually weak or tired, feeling faint or lightheaded, falls signs of infection - fever or chills, cough, sore throat, pain or difficulty passing urine signs and symptoms of liver injury like dark yellow or brown urine; general ill feeling or flu-like symptoms; light-colored stools; loss of appetite; nausea; right upper belly pain; unusually weak or tired; yellowing of the eyes or skin swelling of the ankles, feet, hands unusually slow heartbeat Side effects that usually do not require medical attention (report to your doctor or health care professional if they continue or are bothersome): diarrhea hair loss loss of appetite muscle or joint pain nausea, vomiting pain, redness, or irritation at site where injected tiredness This list may not describe all possible side effects. Call your doctor for medical advice about side effects. You may report side effects to FDA at 1-800-FDA-1088. Where should I keep my  medication? This drug is given in a hospital or clinic and will not be stored at home. NOTE: This sheet is a summary. It may not cover all possible information. If you have questions about this medicine, talk to your doctor, pharmacist, or health care provider.  2022 Elsevier/Gold Standard (2021-06-03 00:00:00)

## 2021-09-09 NOTE — Progress Notes (Signed)
K 3.0 - no IV supplementation today per Dr. Tasia Catchings.

## 2021-09-10 ENCOUNTER — Encounter: Payer: Self-pay | Admitting: Oncology

## 2021-09-10 ENCOUNTER — Telehealth: Payer: Self-pay | Admitting: Pharmacist

## 2021-09-10 ENCOUNTER — Telehealth: Payer: Self-pay | Admitting: Pharmacy Technician

## 2021-09-10 ENCOUNTER — Other Ambulatory Visit (HOSPITAL_COMMUNITY): Payer: Self-pay

## 2021-09-10 DIAGNOSIS — C50919 Malignant neoplasm of unspecified site of unspecified female breast: Secondary | ICD-10-CM

## 2021-09-10 NOTE — Telephone Encounter (Signed)
Oral Oncology Patient Advocate Encounter   Received notification from [facility] that prior authorization for Verzenio is required.   PA submitted on CoverMyMeds Key [id number]  Status is pending   Oral Oncology Clinic will continue to follow.  [place] Patient [place] Phone [phone] Fax [phone] 09/10/2021 3:13 PM

## 2021-09-10 NOTE — Telephone Encounter (Signed)
Oral Oncology Patient Advocate Encounter  Prior Authorization for Amber Blevins has been approved.    PA# [id number] Effective dates: 09/10/21 through 03/09/22  Patients co-pay is $100.00  Oral Oncology Clinic will continue to follow.   [name] Phone [phone] Fax [phone] 09/10/2021 3:15 PM

## 2021-09-10 NOTE — Telephone Encounter (Signed)
Oral Oncology Pharmacist Encounter  Received new prescription for Verzenio (abemaciclib) for the treatment of metastatic breast cancer, ER/PR positive, HER2 negative, in conjunction with letrozole, planned duration until disease progression or unacceptable drug toxicity. PAtient was initially treated with IV chemotherapy due to viseral crisis, she has since improved and the plan is to switch her to oral chemotherapy.  CMP/CBC from 09/09/21 assessed, no relevant lab abnormalities.   Current medication list in Epic reviewed, no DDIs with abemaciclib identified.  Evaluated chart and no patient barriers to medication adherence identified.   Oral Oncology Clinic will continue to follow for insurance authorization, copayment issues, initial counseling and start date.  Patient agreed to treatment on 09/09/21 per MD documentation.  Darl Pikes, PharmD, BCPS, BCOP, CPP Hematology/Oncology Clinical Pharmacist Practitioner [place] [phone]  09/10/2021 9:06 AM

## 2021-09-11 ENCOUNTER — Other Ambulatory Visit: Payer: Self-pay

## 2021-09-11 ENCOUNTER — Ambulatory Visit
Admission: RE | Admit: 2021-09-11 | Discharge: 2021-09-11 | Disposition: A | Discharge status: Home / Self Care | Payer: Medicare PPO | Source: Ambulatory Visit | Attending: Radiation Oncology | Admitting: Radiation Oncology

## 2021-09-11 ENCOUNTER — Encounter: Payer: Self-pay | Admitting: Radiation Oncology

## 2021-09-11 VITALS — BP 106/76 | HR 86 | Temp 98.6°F | Wt 177.4 lb

## 2021-09-11 DIAGNOSIS — Z923 Personal history of irradiation: Secondary | ICD-10-CM | POA: Diagnosis not present

## 2021-09-11 DIAGNOSIS — Z170 Estrogen receptor positive status [ER+]: Secondary | ICD-10-CM | POA: Diagnosis not present

## 2021-09-11 DIAGNOSIS — C787 Secondary malignant neoplasm of liver and intrahepatic bile duct: Secondary | ICD-10-CM | POA: Diagnosis not present

## 2021-09-11 DIAGNOSIS — C7800 Secondary malignant neoplasm of unspecified lung: Secondary | ICD-10-CM | POA: Diagnosis not present

## 2021-09-11 DIAGNOSIS — C7951 Secondary malignant neoplasm of bone: Secondary | ICD-10-CM | POA: Insufficient documentation

## 2021-09-11 DIAGNOSIS — C7970 Secondary malignant neoplasm of unspecified adrenal gland: Secondary | ICD-10-CM | POA: Insufficient documentation

## 2021-09-11 DIAGNOSIS — C50912 Malignant neoplasm of unspecified site of left female breast: Secondary | ICD-10-CM | POA: Insufficient documentation

## 2021-09-11 NOTE — Progress Notes (Signed)
Radiation Oncology Follow up Note old patient new area cervical spine metastasis  Name: Amber Blevins   Date:   09/11/2021 MRN:  [id number] DOB: 13-Dec-1950    This 70 y.o. 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  REFERRING PROVIDER: Gayland Curry, MD  HPI: Patient is a 70 year old female now out 2 weeks having completed palliative radiation therapy to her lumbar and thoracic spine for stage IV metastatic breast cancer.  She is had excellent palliative benefit having no pain at this time.  Her previous MRI of her cervical spine showed metastatic disease involving C6 both involving the body and spinous process.  No other evidence of metastatic disease within the cervical spine.  This is not causing significant pain at this time.  She had a recent CT scan of her chest abdomen pelvis showing improvement in pulmonary nodules and improvement in mediastinal hilar adenopathy.  She also had interval increase in density sclerotic bone metastasis favoring positive treatment response.  Her hepatic lesions were similar as well as her adrenal gland metastasis.  She is seen today for consideration of cervical spine treatment.  COMPLICATIONS OF TREATMENT: none  FOLLOW UP COMPLIANCE: keeps appointments   PHYSICAL EXAM:  BP 106/76    Pulse 86    Temp 98.6 F (37 C) (Tympanic)    Wt 177 lb 6.4 oz (80.5 kg)    BMI 30.45 kg/m  Motor or sensory and DTR levels are equal and symmetric in upper and lower extremities.  Well-developed well-nourished patient in NAD. HEENT reveals PERLA, EOMI, discs not visualized.  Oral cavity is clear. No oral mucosal lesions are identified. Neck is clear without evidence of cervical or supraclavicular adenopathy. Lungs are clear to A&P. Cardiac examination is essentially unremarkable with regular rate and rhythm without  murmur rub or thrill. Abdomen is benign with no organomegaly or masses noted. Motor sensory and DTR levels are equal and symmetric in the upper and lower extremities. Cranial nerves II through XII are grossly intact. Proprioception is intact. No peripheral adenopathy or edema is identified. No motor or sensory levels are noted. Crude visual fields are within normal range.  RADIOLOGY RESULTS: MRI scans and CT scans all reviewed compatible with above-stated findings  PLAN: Present time I have propose going ahead with cervical spine radiation therapy.  Based on the fact the spinous processes also involved should there be progression would likely have compression fracture which we can prevent with palliative radiation therapy.  I would use a hybrid treatment plan to spare her throat and plan on delivering 30 Gray in 10 fractions.  Risks and benefits of treatment reviewed with the patient and her husband.  We will wait till after the first of the year to do treatment planning.  Patient comprehends my recommendations well.  I would like to take this opportunity to thank you for allowing me to participate in the care of your patient.Noreene Filbert, MD

## 2021-09-16 ENCOUNTER — Other Ambulatory Visit (HOSPITAL_COMMUNITY): Payer: Self-pay

## 2021-09-16 ENCOUNTER — Inpatient Hospital Stay: Payer: Medicare PPO

## 2021-09-16 ENCOUNTER — Encounter: Payer: Self-pay | Admitting: Oncology

## 2021-09-16 ENCOUNTER — Other Ambulatory Visit: Payer: Self-pay

## 2021-09-16 ENCOUNTER — Inpatient Hospital Stay: Payer: Medicare PPO | Admitting: Oncology

## 2021-09-16 VITALS — BP 114/75 | HR 94 | Temp 98.7°F | Resp 18 | Wt 176.2 lb

## 2021-09-16 VITALS — BP 116/79 | HR 86

## 2021-09-16 DIAGNOSIS — Z5111 Encounter for antineoplastic chemotherapy: Secondary | ICD-10-CM

## 2021-09-16 DIAGNOSIS — G893 Neoplasm related pain (acute) (chronic): Secondary | ICD-10-CM

## 2021-09-16 DIAGNOSIS — Z95828 Presence of other vascular implants and grafts: Secondary | ICD-10-CM

## 2021-09-16 DIAGNOSIS — C50919 Malignant neoplasm of unspecified site of unspecified female breast: Secondary | ICD-10-CM

## 2021-09-16 DIAGNOSIS — C7951 Secondary malignant neoplasm of bone: Secondary | ICD-10-CM | POA: Diagnosis not present

## 2021-09-16 DIAGNOSIS — M7989 Other specified soft tissue disorders: Secondary | ICD-10-CM | POA: Diagnosis not present

## 2021-09-16 LAB — COMPREHENSIVE METABOLIC PANEL
ALT: 19 U/L (ref 0–44)
AST: 23 U/L (ref 15–41)
Albumin: 3.7 g/dL (ref 3.5–5.0)
Alkaline Phosphatase: 61 U/L (ref 38–126)
Anion gap: 8 (ref 5–15)
BUN: 21 mg/dL (ref 8–23)
CO2: 27 mmol/L (ref 22–32)
Calcium: 8.5 mg/dL — ABNORMAL LOW (ref 8.9–10.3)
Chloride: 99 mmol/L (ref 98–111)
Creatinine, Ser: 0.64 mg/dL (ref 0.44–1.00)
GFR, Estimated: >60 mL/min (ref >60)
Glucose, Bld: 128 mg/dL — ABNORMAL HIGH (ref 70–99)
Potassium: 3.3 mmol/L — ABNORMAL LOW (ref 3.5–5.1)
Sodium: 134 mmol/L — ABNORMAL LOW (ref 135–145)
Total Bilirubin: 1.5 mg/dL — ABNORMAL HIGH (ref 0.3–1.2)
Total Protein: 6.7 g/dL (ref 6.5–8.1)

## 2021-09-16 LAB — CBC WITH DIFFERENTIAL/PLATELET
Abs Immature Granulocytes: 0.24 10*3/uL — ABNORMAL HIGH (ref 0.00–0.07)
Basophils Absolute: 0 10*3/uL (ref 0.0–0.1)
Basophils Relative: 1 %
Eosinophils Absolute: 0.1 10*3/uL (ref 0.0–0.5)
Eosinophils Relative: 3 %
HCT: 31.7 % — ABNORMAL LOW (ref 36.0–46.0)
Hemoglobin: 10.6 g/dL — ABNORMAL LOW (ref 12.0–15.0)
Immature Granulocytes: 5 %
Lymphocytes Relative: 6 %
Lymphs Abs: 0.3 10*3/uL — ABNORMAL LOW (ref 0.7–4.0)
MCH: 29.6 pg (ref 26.0–34.0)
MCHC: 33.4 g/dL (ref 30.0–36.0)
MCV: 88.5 fL (ref 80.0–100.0)
Monocytes Absolute: 0.5 10*3/uL (ref 0.1–1.0)
Monocytes Relative: 11 %
Neutro Abs: 3.5 10*3/uL (ref 1.7–7.7)
Neutrophils Relative %: 74 %
Platelets: 368 10*3/uL (ref 150–400)
RBC: 3.58 MIL/uL — ABNORMAL LOW (ref 3.87–5.11)
RDW: 19.9 % — ABNORMAL HIGH (ref 11.5–15.5)
WBC: 4.7 10*3/uL (ref 4.0–10.5)
nRBC: 2.1 % — ABNORMAL HIGH (ref 0.0–0.2)

## 2021-09-16 MED ORDER — SODIUM CHLORIDE 0.9 % IV SOLN
75.0000 mg/m2 | Freq: Once | INTRAVENOUS | Status: AC
  Administered 2021-09-16: 11:00:00 144 mg via INTRAVENOUS
  Filled 2021-09-16: qty 24

## 2021-09-16 MED ORDER — ABEMACICLIB 100 MG PO TABS
100.0000 mg | ORAL_TABLET | Freq: Two times a day (BID) | ORAL | 0 refills | Status: DC
  Filled 2021-09-16 – 2021-09-17 (×2): qty 56, 28d supply, fill #0

## 2021-09-16 MED ORDER — SODIUM CHLORIDE 0.9 % IV SOLN
10.0000 mg | Freq: Once | INTRAVENOUS | Status: AC
  Administered 2021-09-16: 10:00:00 10 mg via INTRAVENOUS
  Filled 2021-09-16: qty 10

## 2021-09-16 MED ORDER — DIPHENHYDRAMINE HCL 50 MG/ML IJ SOLN
25.0000 mg | Freq: Once | INTRAMUSCULAR | Status: AC
  Administered 2021-09-16: 10:00:00 25 mg via INTRAVENOUS
  Filled 2021-09-16: qty 1

## 2021-09-16 MED ORDER — FAMOTIDINE 20 MG IN NS 100 ML IVPB
20.0000 mg | Freq: Once | INTRAVENOUS | Status: AC
  Administered 2021-09-16: 20 mg via INTRAVENOUS
  Filled 2021-09-16: qty 20

## 2021-09-16 MED ORDER — SODIUM CHLORIDE 0.9 % IV SOLN
Freq: Once | INTRAVENOUS | Status: AC
  Filled 2021-09-16: qty 250

## 2021-09-16 MED ORDER — HEPARIN SOD (PORK) LOCK FLUSH 100 UNIT/ML IV SOLN
500.0000 [IU] | Freq: Once | INTRAVENOUS | Status: AC | PRN
  Administered 2021-09-16: 12:00:00 500 [IU]
  Filled 2021-09-16: qty 5

## 2021-09-16 MED ORDER — LETROZOLE 2.5 MG PO TABS: 2.5000 mg | ORAL_TABLET | Freq: Every day | ORAL | 1 refills | Status: DC

## 2021-09-16 NOTE — Progress Notes (Signed)
Pt here for follow up. No new concerns voiced.   

## 2021-09-16 NOTE — Patient Instructions (Signed)
[facility] AT [place]-MEDICAL ONCOLOGY  Discharge Instructions: ?Thank you for choosing [facility] to provide your oncology and hematology care.  ?If you have a lab appointment with the Cancer Center, please go directly to the Cancer Center and check in at the registration area. ? ?Wear comfortable clothing and clothing appropriate for easy access to any Portacath or PICC line.  ? ?We strive to give you quality time with your provider. You may need to reschedule your appointment if you arrive late (15 or more minutes).  Arriving late affects you and other patients whose appointments are after yours.  Also, if you miss three or more appointments without notifying the office, you may be dismissed from the clinic at the provider?s discretion.    ?  ?For prescription refill requests, have your pharmacy contact our office and allow 72 hours for refills to be completed.   ? ?  ?To help prevent nausea and vomiting after your treatment, we encourage you to take your nausea medication as directed. ? ?BELOW ARE SYMPTOMS THAT SHOULD BE REPORTED IMMEDIATELY: ?*FEVER GREATER THAN 100.4 F (38 ?C) OR HIGHER ?*CHILLS OR SWEATING ?*NAUSEA AND VOMITING THAT IS NOT CONTROLLED WITH YOUR NAUSEA MEDICATION ?*UNUSUAL SHORTNESS OF BREATH ?*UNUSUAL BRUISING OR BLEEDING ?*URINARY PROBLEMS (pain or burning when urinating, or frequent urination) ?*BOWEL PROBLEMS (unusual diarrhea, constipation, pain near the anus) ?TENDERNESS IN MOUTH AND THROAT WITH OR WITHOUT PRESENCE OF ULCERS (sore throat, sores in mouth, or a toothache) ?UNUSUAL RASH, SWELLING OR PAIN  ?UNUSUAL VAGINAL DISCHARGE OR ITCHING  ? ?Items with * indicate a potential emergency and should be followed up as soon as possible or go to the Emergency Department if any problems should occur. ? ?Please show the CHEMOTHERAPY ALERT CARD or IMMUNOTHERAPY ALERT CARD at check-in to the Emergency Department and triage nurse. ? ?Should you have questions after your visit  or need to cancel or reschedule your appointment, please contact [facility] AT [place]-MEDICAL ONCOLOGY  [phone] and follow the prompts.  Office hours are 8:00 a.m. to 4:30 p.m. Monday - Friday. Please note that voicemails left after 4:00 p.m. may not be returned until the following business day.  We are closed weekends and major holidays. You have access to a nurse at all times for urgent questions. Please call the main number to the clinic [phone] and follow the prompts. ? ?For any non-urgent questions, you may also contact your provider using MyChart. We now offer e-Visits for anyone 18 and older to request care online for non-urgent symptoms. For details visit [email or web address]. ?  ?Also download the MyChart app! Go to the app store, search "MyChart", open the app, select , and log in with your MyChart username and password. ? ?Due to Covid, a mask is required upon entering the hospital/clinic. If you do not have a mask, one will be given to you upon arrival. For doctor visits, patients may have 1 support person aged 18 or older with them. For treatment visits, patients cannot have anyone with them due to current Covid guidelines and our immunocompromised population.  ?

## 2021-09-16 NOTE — Progress Notes (Signed)
Hematology/Oncology follow up note [phone] [phone]   Patient Care Team: Gayland Curry, MD as PCP - General (Family Medicine) Noreene Filbert, MD as Consulting Physician (Radiation Oncology)  REFERRING PROVIDER: Gayland Curry, MD  CHIEF COMPLAINTS/REASON FOR VISIT:  metastatic breast caner  HISTORY OF PRESENTING ILLNESS:   Amber Blevins is a  70 y.o.  female with PMH listed below was seen in consultation at the request of  Gayland Curry, MD  for evaluation of tumor in lumbar vertebrae  # patient reports feeling tightness and pain in Feb 2022. She was started on conservative management which did not help her symptoms. She also started to experience numbness.  She takes gabapentin which partially relieve the discomfort. She denies bowel or bladder incontinence, no lower extremity weakness.   06/25/2021 MRI lumbar spine IMPRESSION:  1. 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 Additional metastatic involvement T12, L1 through L3.  No primary tumor site identified. Top differential considerations are Metastatic Disease Unknown primary, less likely Lymphoma or Multiple Myeloma.   2. Superimposed lumbar spine degeneration, including degenerative moderate to severe left L3 and L4 nerve level impingement from disc herniation.    Patient was seen by neurosurgeon Dr.Yarbrough today. He has ordered MRI cervical and thoracic spine and sacrum biopsy for further work up.   Patient has a history of breast cancer, diagnosed in 2010 Left-sided T2 (2.4cm) N0, ER/PR positive, HER2 negative IDC of the breast, s/p lumpectomy by Dr[facility]Meyer at [facility]. Oncotype score of 11 who completed radiation and she finished 10 years  of Femara from 05/2009 and stopped in 2020.  she has intentionally lost some weight, no night sweating, fever.   # 07/02/2021, CT chest abdomen pelvis showed innumerable small pulmonary and pleural nodules consistent with diffuse metastasis.  Associated with probable malignant pleural effusion.  Mediastinal and hilar lymphadenopathy consistent with metastatic disease.  Hepatic and bilateral adrenal gland metastasis.  Diffuse extensive destructive metastatic bone disease involving pelvis.   07/11/2021 - 07/14/2021  Patient was admitted to hospital due to shortness of breath. 07/12/2021, CT chest PE protocol showed no PE.  Further increase of March pleural effusion and admit to moderate interstitial pulmonary edema.  Bulky mediastinal and hilar lymphadenopathy.  Unchanged.  Incompletely visualized left adrenal nodule. 07/12/2021 patient underwent right thoracentesis.  Cytology was positive for metastatic carcinoma, compatible with breast origin.  ER/PR +, HER2 negative   07/16/2021, patient underwent iliac bone biopsy.  Positive for metastatic carcinoma. 07/25/2021, patient underwent thoracentesis of right side, removed 550 cc straw-colored pleural fluid.  She tolerated procedure well.  09/04/2021, CT chest abdomen pelvis showed improvement of the pulmonary nodularity and thickening in the left and the right lung.  Persistent nodular interstitial and pleural thickening remains.  Improvement of mediastinal and hilar adenopathy, hepatic metastatic lesions are similar.  Adrenal gland metastasis is similar.  Interval increase in the sclerotic bone metastasis.  Discussed with patient that I would like to proceed with 2 additional cycles of Taxol.  09/09/2021 Taxol 09/16/2021 Taxol  INTERVAL HISTORY Amber Blevins is a 70 y.o. female who has above history reviewed by me today presents for follow up visit for management of metastatic breast cancer . Patient reports feeling well. Ongoing numbness  tingling of the fingertips and toes.  Patient takes gabapentin  Breathing status is stable.  Pain has improved.  She is no longer on narcotics.   Normal, Review of Systems  Constitutional:  Negative for appetite change, chills, fatigue and fever.  HENT:   Negative for hearing loss and voice change.   Eyes:  Negative for eye problems.  Respiratory:  Negative for chest tightness and cough.   Cardiovascular:  Negative for chest pain.  Gastrointestinal:  Negative for abdominal distention, abdominal pain and blood in stool.  Endocrine: Negative for hot flashes.  Genitourinary:  Negative for difficulty urinating and frequency.   Musculoskeletal:  Negative for arthralgias and back pain.       Lower back/sacrum pain and numbness  Skin:  Negative for itching and rash.  Neurological:  Negative for extremity weakness.  Hematological:  Negative for adenopathy.  Psychiatric/Behavioral:  Negative for confusion.    MEDICAL HISTORY:  Past Medical History:  Diagnosis Date   Breast cancer ([place])    COPD (chronic obstructive pulmonary disease) ([place])    Family history of adverse reaction to anesthesia    brother has problem with coming out of anesthesia   High cholesterol    Hypertension    Personal history of radiation therapy    Pre-diabetes     SURGICAL HISTORY: Past Surgical History:  Procedure Laterality Date   BREAST LUMPECTOMY Left    2010   COLONOSCOPY     EYE SURGERY Left    Cataract surgery   PORTACATH PLACEMENT N/A 08/01/2021   Procedure: INSERTION PORT-A-CATH;  Surgeon: Herbert Pun, MD;  Location: [facility] ORS;  Service: General;  Laterality: N/A;   thorocentesis Right 07/10/2021   and another on 07/25/21    SOCIAL HISTORY: Social History   Socioeconomic History   Marital status: Married    Spouse name: Not on file   Number of children: Not on file   Years of education: Not on file   Highest education level: Not on file  Occupational History   Not on file  Tobacco  Use   Smoking status: Former    Packs/day: 1.00    Years: 25.00    Pack years: 25.00    Types: Cigarettes    Quit date: 81    Years since quitting: 32.9   Smokeless tobacco: Never  Vaping Use   Vaping Use: Never used  Substance and Sexual Activity   Alcohol use: Not Currently   Drug use: Never   Sexual activity: Not on file  Other Topics Concern   Not on file  Social History Narrative   Not on file   Social Determinants of Health   Financial Resource Strain: Not on file  Food Insecurity: Not on file  Transportation Needs: Not on file  Physical Activity: Not on file  Stress: Not on file  Social Connections: Not on file  Intimate Partner Violence: Not on file    FAMILY HISTORY: Family History  Problem Relation Age of Onset   Cancer Mother        gynecological   Lung cancer Mother    Diabetes Father    Heart disease Father    Parkinson's disease Father    Brain cancer Brother    Bladder Cancer Brother    Pulmonary disease Brother    Rheumatic fever Brother    Breast cancer Neg Hx     ALLERGIES:  is allergic to green tea (camellia sinensis), melaleuca viridiflora, and lisinopril.  MEDICATIONS:  Current Outpatient Medications  Medication Sig Dispense Refill   acetaminophen (TYLENOL) 650  MG CR tablet Take 650 mg by mouth every 8 (eight) hours as needed for pain.     albuterol (VENTOLIN HFA) 108 (90 Base) MCG/ACT inhaler Inhale 2 puffs into the lungs every 6 (six) hours as needed.     aspirin 81 MG EC tablet Take by mouth.     atorvastatin (LIPITOR) 40 MG tablet Take 40 mg by mouth at bedtime.     Boswellia-Glucosamine-Vit D (OSTEO BI-FLEX-GLUCOS/5-LOXIN) TABS Take 1 capsule by mouth in the morning and at bedtime.     Calcium Carb-Cholecalciferol 600-400 MG-UNIT TABS Take 1 tablet by mouth daily.     Docusate Sodium (DSS) 100 MG CAPS Take 1 capsule by mouth every other day.     Fluticasone-Umeclidin-Vilant (TRELEGY ELLIPTA) 100-62.5-25 MCG/INH AEPB Inhale into  the lungs. Inhale 1 inhalation into the lungs once daily     gabapentin (NEURONTIN) 300 MG capsule Take 2 capsules by mouth 2 (two) times daily.     ibuprofen (ADVIL) 200 MG tablet Take by mouth. Take 400 mg by mouth every six (6) hours as needed for pain.     letrozole (FEMARA) 2.5 MG tablet Take 1 tablet (2.5 mg total) by mouth daily. 90 tablet 1   lidocaine-prilocaine (EMLA) cream Apply 1 application topically as needed. 30 g 5   Loperamide HCl (IMODIUM PO) Take by mouth.     LORazepam (ATIVAN) 0.5 MG tablet Take 1 tablet (0.5 mg total) by mouth at bedtime as needed for anxiety. 30 tablet 0   magnesium chloride (SLOW-MAG) 64 MG TBEC SR tablet Take 1 tablet by mouth at bedtime.     metoprolol-hydrochlorothiazide (LOPRESSOR HCT) 50-25 MG tablet Take 1 tablet by mouth daily. Take 1 tablet by mouth once daily     ondansetron (ZOFRAN) 8 MG tablet Take 1 tablet (8 mg total) by mouth 2 (two) times daily as needed (Nausea or vomiting). 30 tablet 1   potassium chloride SA (KLOR-CON M) 20 MEQ tablet Take 1 tablet (20 mEq total) by mouth 2 (two) times daily. 60 tablet 0   prochlorperazine (COMPAZINE) 10 MG tablet Take 1 tablet (10 mg total) by mouth every 6 (six) hours as needed (Nausea or vomiting). 30 tablet 1   sucralfate (CARAFATE) 1 GM/10ML suspension Take 10 mLs (1 g total) by mouth 3 (three) times daily. 420 mL 2   Turmeric (QC TUMERIC COMPLEX PO) Take 1 capsule by mouth at bedtime.     abemaciclib (VERZENIO) 100 MG tablet Take 1 tablet (100 mg total) by mouth 2 (two) times daily. Swallow tablets whole. Do not chew, crush, or split tablets before swallowing. 56 tablet 0   oxyCODONE (OXY IR/ROXICODONE) 5 MG immediate release tablet Take 1 tablet (5 mg total) by mouth every 12 (twelve) hours as needed for severe pain or moderate pain. (Patient not taking: Reported on 09/16/2021) 60 tablet 0   No current facility-administered medications for this visit.     PHYSICAL EXAMINATION: ECOG PERFORMANCE  STATUS: 1 - Symptomatic but completely ambulatory Vitals:   09/16/21 0843  BP: 114/75  Pulse: 94  Resp: 18  Temp: 98.7 F (37.1 C)  SpO2: 99%   Filed Weights   09/16/21 0843  Weight: 176 lb 3.2 oz (79.9 kg)    Physical Exam Constitutional:      General: She is not in acute distress. HENT:     Head: Normocephalic and atraumatic.  Eyes:     General: No scleral icterus. Cardiovascular:     Rate and Rhythm: Normal rate  and regular rhythm.     Heart sounds: Normal heart sounds.  Pulmonary:     Effort: Pulmonary effort is normal. No respiratory distress.     Breath sounds: No wheezing.     Comments: Decreased breath sound bibasilar Abdominal:     General: Bowel sounds are normal. There is no distension.     Palpations: Abdomen is soft.  Musculoskeletal:        General: No deformity. Normal range of motion.     Cervical back: Normal range of motion and neck supple.     Comments: Left lower extremity edema 1+, no tenderness.  Skin:    General: Skin is warm and dry.     Findings: No erythema or rash.  Neurological:     Mental Status: She is alert and oriented to person, place, and time. Mental status is at baseline.     Cranial Nerves: No cranial nerve deficit.     Coordination: Coordination normal.  Psychiatric:        Mood and Affect: Mood normal.    LABORATORY DATA:  I have reviewed the data as listed Lab Results  Component Value Date   WBC 4.7 09/16/2021   HGB 10.6 (L) 09/16/2021   HCT 31.7 (L) 09/16/2021   MCV 88.5 09/16/2021   PLT 368 09/16/2021   Recent Labs    09/02/21 0804 09/09/21 0824 09/16/21 0816  NA 135 134* 134*  K 3.5 3.0* 3.3*  CL 97* 99 99  CO2 28 26 27   GLUCOSE 128* 126* 128*  BUN 22 22 21   CREATININE 0.56 0.61 0.64  CALCIUM 9.0 8.6* 8.5*  GFRNONAA >60 >60 >60  PROT 6.5 6.3* 6.7  ALBUMIN 3.7 3.6 3.7  AST 23 27 23   ALT 21 22 19   ALKPHOS 89 73 61  BILITOT 1.3* 1.0 1.5*    Iron/TIBC/Ferritin/ %Sat No results found for: IRON, TIBC,  FERRITIN, IRONPCTSAT    RADIOGRAPHIC STUDIES: I have personally reviewed the radiological images as listed and agreed with the findings in the report. CT CHEST ABDOMEN PELVIS W CONTRAST  Result Date: 09/04/2021 CLINICAL DATA:  Restaging LEFT breast cancer with diagnosis 2010. Surveillance exam. Bone metastasis EXAM: CT CHEST, ABDOMEN, AND PELVIS WITH CONTRAST TECHNIQUE: Multidetector CT imaging of the chest, abdomen and pelvis was performed following the standard protocol during bolus administration of intravenous contrast. CONTRAST:  157m OMNIPAQUE IOHEXOL 350 MG/ML SOLN COMPARISON:  CT 07/02/2021 FINDINGS: CT CHEST FINDINGS Cardiovascular: Port in the anterior chest wall with tip in distal SVC. No pulmonary embolism. Mediastinum/Nodes: RIGHT lower paratracheal node measuring 12 mm is decreased from 17 mm. Subcarinal node measuring 8 mm decreased from 15 mm. RIGHT hilar node measuring approximately 10 mm decreased from 18 mm no new adenopathy. Lungs/Pleura: Previous seen extensive pulmonary nodularity and interstitial thickening in the RIGHT middle lobe in lingula has improved. Interstitial thickening is less prominent. Individual pulmonary nodules are decreased in size. For example nodule along the RIGHT oblique fissure measuring 5 mm (image 66/3 decreased from 7 mm. Nodularity along the LEFT oblique fissure measuring 2 mm decreased from 3 mm. No new nodularity. Musculoskeletal: Multiple sites of sclerotic metastasis Increased sclerosis at previous sites metastatic skeletal disease in the thoracic spine and sternum. For example new sclerotic lesion in the T7 vertebral body measuring 15 mm. Increased sclerosis within the T4 vertebral body. CT ABDOMEN AND PELVIS FINDINGS Hepatobiliary: Hypoenhancing lesions in liver again noted. Example lesion in the LEFT hepatic lobe along the falciform ligament measuring 10 mm (  image 54/2) compares to 9 mm. Lesion in the RIGHT hepatic lobe inferiorly measuring 16 mm (image  62/2 compares to 14 mm. Lesion are more defined. No new lesions are present. Pancreas: No pancreatic Spleen: Normal Adrenals/urinary tract: Thickening of the adrenal glands is similar. LEFT adrenal gland measures 13 mm in thickness compared to 14 mm. Stomach/Bowel: Stomach, small bowel, appendix, and cecum are normal. The colon and rectosigmoid colon are normal. Vascular/Lymphatic: Abdominal aorta is normal caliber. There is no retroperitoneal or periportal lymphadenopathy. No pelvic lymphadenopathy. Reproductive: Uterus and adnexa normal. Other: No peritoneal metastasis Musculoskeletal: Interval increase in sclerosis of the diffuse metastasis within the sacrum. Sclerotic lesions in the iliac wings are slightly increased density IMPRESSION: Chest Impression: 1. Improvement in the pulmonary nodularity an dinitial thickening in the LEFT and RIGHT lung. Persistent nodular interstitial and pleural thickening remains. 2. Improvement in mediastinal and hilar adenopathy. 3. Interval increase in density of sclerotic bone metastasis. Favor positive treatment response. Abdomen / Pelvis Impression: 1. Hepatic metastatic lesions are similar. Lesions are slightly more conspicuous which may be treatment effect or phase of contrast. 2. Adrenal gland metastasis similar. 3. Interval increase in sclerosis of skeletal metastasis in the pelvis and lumbar spine. Electronically Signed   By: Suzy Bouchard M.D.   On: 09/04/2021 12:43   US Venous Img Lower Unilateral Left  Result Date: 08/28/2021 CLINICAL DATA:  Lower extremity swelling for 2 weeks EXAM: LEFT LOWER EXTREMITY VENOUS DOPPLER ULTRASOUND TECHNIQUE: Gray-scale sonography with graded compression, as well as color Doppler and duplex ultrasound were performed to evaluate the lower extremity deep venous systems from the level of the common femoral vein and including the common femoral, femoral, profunda femoral, popliteal and calf veins including the posterior tibial,  peroneal and gastrocnemius veins when visible. The superficial great saphenous vein was also interrogated. Spectral Doppler was utilized to evaluate flow at rest and with distal augmentation maneuvers in the common femoral, femoral and popliteal veins. COMPARISON:  None. FINDINGS: Contralateral Common Femoral Vein: Respiratory phasicity is normal and symmetric with the symptomatic side. No evidence of thrombus. Normal compressibility. Common Femoral Vein: No evidence of thrombus. Normal compressibility, respiratory phasicity and response to augmentation. Saphenofemoral Junction: No evidence of thrombus. Normal compressibility and flow on color Doppler imaging. Profunda Femoral Vein: No evidence of thrombus. Normal compressibility and flow on color Doppler imaging. Femoral Vein: No evidence of thrombus. Normal compressibility, respiratory phasicity and response to augmentation. Popliteal Vein: No evidence of thrombus. Normal compressibility, respiratory phasicity and response to augmentation. Calf Veins: No evidence of thrombus. Normal compressibility and flow on color Doppler imaging. IMPRESSION: No evidence of deep venous thrombosis. Electronically Signed   By: Jerilynn Mages.  Shick M.D.   On: 08/28/2021 10:06      ASSESSMENT & PLAN:  1. Metastatic breast cancer ([place])   2. Metastasis to bone ([place])   3. Encounter for antineoplastic chemotherapy   4. Neoplasm related pain   5. Swelling of left lower extremity   6. Port-A-Cath in place    # Metastatic Breast cancer with extensive thoracic and bone involvement, in visceral crisis, ER+, PR+ HER2 neg MRI brain is negative Labs are reviewed and discussed with patient.  Proceed with Taxol today. Discussed with patient about the rationale and potential side effects of abemaciclib and letrozole.  Patient has previously taken letrozole in the past. Patient agrees with the plan.  Letrozole prescription was sent to pharmacy. Patient will have discussion of abemaciclib  instructions/side effects with pharmacist. Advised patient to  start letrozole/abemaciclib 150 mg twice daily next week.  I will see her 1 week after she starts treatments.   #Grade 2 neuropathy, continue gabapentin, she may increase further to 600 mg 3 times daily.  #Extensive bone metastasis.  Status post palliative radiation to lumbar/sacrum Status post radiation to thoracic spine and cervical spine. Has been tapered off dexamethasone. Status post Zometa 09/02/2021.    #Hypocalcemia secondary to Zometa use.  Continue calcium supplementation.  # Neoplasm related pain,  Pain has improved.  She is currently off oxycodone.  #Anxiety and insomnia, continue low-dose Ativan 0.5 mg QHS PRN #Left lower extremity swelling, no DVT on ultrasound.  Encourage leg elevation. #Port-A-Cath in place, once she is transition to oral therapy, will schedule patient to have port flush. All questions were answered. The patient knows to call the clinic with any problems questions or concerns.  cc Gayland Curry, MD    Follow up in 2 weeks  lab MD  treatments.   [name], MD, PhD  09/16/2021

## 2021-09-17 ENCOUNTER — Encounter: Payer: Self-pay | Admitting: Oncology

## 2021-09-17 ENCOUNTER — Other Ambulatory Visit (HOSPITAL_COMMUNITY): Payer: Self-pay

## 2021-09-18 ENCOUNTER — Inpatient Hospital Stay: Payer: Medicare PPO | Admitting: Pharmacist

## 2021-09-18 ENCOUNTER — Other Ambulatory Visit: Payer: Self-pay

## 2021-09-18 DIAGNOSIS — C50919 Malignant neoplasm of unspecified site of unspecified female breast: Secondary | ICD-10-CM

## 2021-09-18 DIAGNOSIS — Z5111 Encounter for antineoplastic chemotherapy: Secondary | ICD-10-CM | POA: Diagnosis not present

## 2021-09-18 NOTE — Progress Notes (Signed)
[facility] [place]  Telephone:([phone] [phone]  Patient Care Team: Gayland Curry, MD as PCP - General (Family Medicine) Noreene Filbert, MD as Consulting Physician (Radiation Oncology)   Name of the patient: Amber Blevins  [id number]  27-Aug-1951   Date of visit: 09/18/21  Virtual visit: I connected with  Amber Blevins on 09/18/21 at 10:00 AM EST by a video enabled telemedicine application and verified that I am speaking with the correct person using two identifiers. I discussed the limitations of evaluation and management by telemedicine and the availability of in person appointments. The patient expressed understanding and agreed to proceed. Locations: Patient: home , Provider: in office  HPI: Patient is a 70 y.o. female with metastatic breast cancer, ER/PR positive, HER2 negative.  Patient was initially treated with IV chemotherapy due to viseral crisis, she has since improved and the plan is to switch her to oral chemotherapy. Planned treatment letrozole and Verzenio (abemaciclib). She will start the letrozole today 09/18/21 and the abemaciclib 09/30/21.  Reason for Consult: Abemaciclib oral chemotherapy education.  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  MEDICATIONS:  Current Outpatient Medications  Medication Sig Dispense Refill   abemaciclib (VERZENIO) 100 MG tablet Take 1 tablet (100 mg total) by mouth 2 (two) times daily. Swallow tablets whole. Do not chew, crush, or split tablets before swallowing. 56 tablet 0   acetaminophen (TYLENOL) 650 MG CR tablet Take 650 mg by mouth every 8 (eight) hours as needed for pain.     albuterol (VENTOLIN HFA) 108 (90 Base) MCG/ACT inhaler Inhale 2 puffs into the lungs every 6 (six) hours as needed.     aspirin 81 MG EC tablet Take by mouth.     atorvastatin (LIPITOR) 40 MG tablet Take 40 mg by mouth at bedtime.     Boswellia-Glucosamine-Vit D (OSTEO BI-FLEX-GLUCOS/5-LOXIN) TABS Take 1 capsule by mouth in the morning and at bedtime.     Calcium Carb-Cholecalciferol 600-400 MG-UNIT TABS Take 1 tablet by mouth daily.     Docusate Sodium (DSS) 100 MG CAPS Take 1 capsule by mouth every other day.     Fluticasone-Umeclidin-Vilant (TRELEGY ELLIPTA) 100-62.5-25 MCG/INH AEPB Inhale into the lungs. Inhale 1 inhalation into the lungs once daily     gabapentin (NEURONTIN) 300 MG capsule Take 2 capsules by mouth 2 (two) times daily.     ibuprofen (ADVIL) 200 MG tablet Take by mouth. Take 400 mg by mouth every six (6) hours as needed for pain.     letrozole (FEMARA) 2.5 MG tablet  Take 1 tablet (2.5 mg total) by mouth daily. 90 tablet 1   lidocaine-prilocaine (EMLA) cream Apply 1 application topically as needed. 30 g 5   Loperamide HCl (IMODIUM PO) Take by mouth.     LORazepam (ATIVAN) 0.5 MG tablet Take 1 tablet (0.5 mg total) by mouth at bedtime as needed for anxiety. 30 tablet 0   magnesium chloride (SLOW-MAG) 64 MG TBEC SR tablet Take 1 tablet by mouth at bedtime.     metoprolol-hydrochlorothiazide (LOPRESSOR  HCT) 50-25 MG tablet Take 1 tablet by mouth daily. Take 1 tablet by mouth once daily     ondansetron (ZOFRAN) 8 MG tablet Take 1 tablet (8 mg total) by mouth 2 (two) times daily as needed (Nausea or vomiting). 30 tablet 1   oxyCODONE (OXY IR/ROXICODONE) 5 MG immediate release tablet Take 1 tablet (5 mg total) by mouth every 12 (twelve) hours as needed for severe pain or moderate pain. (Patient not taking: Reported on 09/16/2021) 60 tablet 0   potassium chloride SA (KLOR-CON M) 20 MEQ tablet Take 1 tablet (20 mEq total) by mouth 2 (two) times daily. 60 tablet 0   prochlorperazine (COMPAZINE) 10 MG tablet Take 1 tablet (10 mg total) by mouth every 6 (six) hours as needed (Nausea or vomiting). 30 tablet 1   sucralfate (CARAFATE) 1 GM/10ML suspension Take 10 mLs (1 g total) by mouth 3 (three) times daily. 420 mL 2   Turmeric (QC TUMERIC COMPLEX PO) Take 1 capsule by mouth at bedtime.     No current facility-administered medications for this visit.    VITAL SIGNS: There were no vitals taken for this visit. There were no vitals filed for this visit.  Estimated body mass index is 30.24 kg/m as calculated from the following:   Height as of 08/01/21: $RemoveBef'5\' 4"'raPnLasORI$  (1.626 m).   Weight as of 09/16/21: 79.9 kg (176 lb 3.2 oz).  LABS: CBC:    Component Value Date/Time   WBC 4.7 09/16/2021 0816   HGB 10.6 (L) 09/16/2021 0816   HCT 31.7 (L) 09/16/2021 0816   PLT 368 09/16/2021 0816   MCV 88.5 09/16/2021 0816   NEUTROABS 3.5 09/16/2021 0816   LYMPHSABS 0.3 (L) 09/16/2021 0816   MONOABS 0.5 09/16/2021 0816   EOSABS 0.1 09/16/2021 0816   BASOSABS 0.0 09/16/2021 0816   Comprehensive Metabolic Panel:    Component Value Date/Time   NA 134 (L) 09/16/2021 0816   K 3.3 (L) 09/16/2021 0816   CL 99 09/16/2021 0816   CO2 27 09/16/2021 0816   BUN 21 09/16/2021 0816   CREATININE 0.64 09/16/2021 0816   GLUCOSE 128 (H) 09/16/2021 0816   CALCIUM 8.5 (L) 09/16/2021 0816   AST 23 09/16/2021 0816   ALT 19  09/16/2021 0816   ALKPHOS 61 09/16/2021 0816   BILITOT 1.5 (H) 09/16/2021 0816   PROT 6.7 09/16/2021 0816   ALBUMIN 3.7 09/16/2021 0816     Present during today's visit: patient only  Start plan: She will start the letrozole today 09/18/21 and the abemaciclib 09/30/21.   Patient Education I spoke with patient for overview of new oral chemotherapy medication: abemaciclib   Administration: Counseled patient on administration, dosing, side effects, monitoring, drug-food interactions, safe handling, storage, and disposal. Patient will take 1 tablet (100 mg total) by mouth 2 (two) times daily.  Side Effects: Side effects include but not limited to: diarrhea, nausea, fatigue, decreased wbc/hgb/plt. Diarrhea: She knows the diarrhea onset is about 6-8 days into treatment. She reports having  loperamide already on hand. She will call the office if she is having 4 or more loose stools per day. Nausea: she has prn anti-emetics at home from her IV treatment. She knows she can continue to use them as needed. The did not need to use any during her time on IV treatment.  Drug-drug Interactions (DDI): No current DDIs with abemaciclib  Adherence: After discussion with patient no patient barriers to medication adherence identified.  Reviewed with patient importance of keeping a medication schedule and plan for any missed doses.  Ms. Dara voiced understanding and appreciation. All questions answered. Medication handout provided.  Provided patient with [facility] phone number. Patient knows to call the office with questions or concerns. [facility] will continue to follow.  Patient expressed understanding and was in agreement with this plan. She also understands that She can call clinic at any time with any questions, concerns, or complaints.   Medication Access Issues: Patient started on abemaciclib using one month free voucher while we wait for  grant assistance to open up  Follow-up plan: RTC on 10/07/21 for a one week f/u visit  Thank you for allowing me to participate in the care of this patient.   Time Total: 30 mins  Visit consisted of counseling and education on dealing with issues of symptom management in the setting of serious and potentially life-threatening illness.Greater than 50%  of this time was spent counseling and coordinating care related to the above assessment and plan.  Signed by: Darl Pikes, PharmD, BCPS, Salley Slaughter, CPP Hematology/Oncology Clinical Pharmacist Practitioner [place]/[facility]/AP [facility] [phone]  09/18/2021 10:39 AM

## 2021-09-19 ENCOUNTER — Other Ambulatory Visit (HOSPITAL_COMMUNITY): Payer: Self-pay

## 2021-09-19 NOTE — Telephone Encounter (Signed)
Oral Oncology Patient Advocate Encounter  I spoke with Amber Blevins on 09/17/21 to set up delivery of Verzenio (free trial).  Address verified for shipment.  Verzenio will be filled through [facility] and mailed 09/17/21 for delivery 09/18/21.    Amber Blevins will call 7-10 days before next refill is due to complete adherence call and set up delivery of medication.     [facility] Phone [phone] Fax [phone] 09/19/2021 11:26 AM

## 2021-09-22 ENCOUNTER — Encounter: Payer: Self-pay | Admitting: Oncology

## 2021-09-23 ENCOUNTER — Encounter: Payer: Self-pay | Admitting: Oncology

## 2021-09-23 NOTE — Telephone Encounter (Signed)
Dr. Tasia Catchings recommends [facility] NP eval with labs. Benjie Karvonen, would you guys be able to accommodate her?

## 2021-09-23 NOTE — Telephone Encounter (Signed)
Spoke to patient. She is no longer having urinary symptoms and denies fever, but reports runny nose and cough. She does not necessarily want to come in, just wanted a suggestion. Suggested that pt continue to push fluids and stay hydrated. Offered a virtual visit to address the neuropathy, but she stated that it can wait until her next scheduled visit. For now, she will continue to monitor her symptoms and will call us if fever or worsening of symptoms develop.

## 2021-10-01 ENCOUNTER — Ambulatory Visit
Admission: RE | Admit: 2021-10-01 | Discharge: 2021-10-01 | Disposition: A | Discharge status: Home / Self Care | Payer: Medicare PPO | Source: Ambulatory Visit | Attending: Radiation Oncology | Admitting: Radiation Oncology

## 2021-10-01 DIAGNOSIS — R600 Localized edema: Secondary | ICD-10-CM | POA: Diagnosis not present

## 2021-10-01 DIAGNOSIS — C7951 Secondary malignant neoplasm of bone: Secondary | ICD-10-CM | POA: Insufficient documentation

## 2021-10-01 DIAGNOSIS — C50919 Malignant neoplasm of unspecified site of unspecified female breast: Secondary | ICD-10-CM | POA: Diagnosis present

## 2021-10-01 DIAGNOSIS — Z510 Encounter for antineoplastic radiation therapy: Secondary | ICD-10-CM | POA: Diagnosis present

## 2021-10-01 DIAGNOSIS — F419 Anxiety disorder, unspecified: Secondary | ICD-10-CM | POA: Insufficient documentation

## 2021-10-01 DIAGNOSIS — Z79899 Other long term (current) drug therapy: Secondary | ICD-10-CM | POA: Insufficient documentation

## 2021-10-01 DIAGNOSIS — G4700 Insomnia, unspecified: Secondary | ICD-10-CM | POA: Insufficient documentation

## 2021-10-02 ENCOUNTER — Telehealth: Payer: Self-pay | Admitting: Pharmacy Technician

## 2021-10-02 DIAGNOSIS — Z510 Encounter for antineoplastic radiation therapy: Secondary | ICD-10-CM | POA: Diagnosis not present

## 2021-10-05 ENCOUNTER — Other Ambulatory Visit: Payer: Self-pay | Admitting: Oncology

## 2021-10-05 MED ORDER — DIPHENOXYLATE-ATROPINE 2.5-0.025 MG PO TABS: 1.0000 | ORAL_TABLET | Freq: Four times a day (QID) | ORAL | 0 refills | Status: DC | PRN

## 2021-10-05 MED ORDER — GABAPENTIN 300 MG PO CAPS: 600.0000 mg | ORAL_CAPSULE | Freq: Three times a day (TID) | ORAL | 1 refills | Status: DC

## 2021-10-06 ENCOUNTER — Ambulatory Visit: Admission: RE | Admit: 2021-10-06 | Payer: Medicare PPO | Source: Ambulatory Visit

## 2021-10-06 DIAGNOSIS — Z510 Encounter for antineoplastic radiation therapy: Secondary | ICD-10-CM | POA: Diagnosis not present

## 2021-10-06 NOTE — Telephone Encounter (Signed)
Oral Oncology Patient Advocate Encounter   Was successful in securing patient an $81 grant from [facility]) to provide copayment coverage for [employer].  This will keep the out of pocket expense at $0.     I have spoken with the patient.    The billing information is as follows and has been shared with [place].   Member ID: [id number] Group ID: [id number] RxBin: [id number] Dates of Eligibility: 07/04/21 through 10/01/22  Fund:  [facility] [place] Phone [phone] Fax [phone] 10/06/2021 3:19 PM

## 2021-10-07 ENCOUNTER — Other Ambulatory Visit (HOSPITAL_COMMUNITY): Payer: Self-pay

## 2021-10-07 ENCOUNTER — Telehealth: Payer: Medicare PPO

## 2021-10-07 ENCOUNTER — Inpatient Hospital Stay: Payer: Medicare PPO | Admitting: Pharmacist

## 2021-10-07 ENCOUNTER — Ambulatory Visit
Admission: RE | Admit: 2021-10-07 | Discharge: 2021-10-07 | Disposition: A | Discharge status: Home / Self Care | Payer: Medicare PPO | Source: Ambulatory Visit | Attending: Radiation Oncology | Admitting: Radiation Oncology

## 2021-10-07 ENCOUNTER — Encounter: Payer: Self-pay | Admitting: Oncology

## 2021-10-07 ENCOUNTER — Inpatient Hospital Stay: Payer: Medicare PPO | Admitting: Oncology

## 2021-10-07 ENCOUNTER — Other Ambulatory Visit: Payer: Self-pay

## 2021-10-07 ENCOUNTER — Inpatient Hospital Stay: Payer: Medicare PPO

## 2021-10-07 VITALS — BP 106/71 | HR 84 | Temp 98.9°F | Wt 172.4 lb

## 2021-10-07 DIAGNOSIS — K521 Toxic gastroenteritis and colitis: Secondary | ICD-10-CM | POA: Insufficient documentation

## 2021-10-07 DIAGNOSIS — Z79899 Other long term (current) drug therapy: Secondary | ICD-10-CM | POA: Insufficient documentation

## 2021-10-07 DIAGNOSIS — Z87891 Personal history of nicotine dependence: Secondary | ICD-10-CM | POA: Insufficient documentation

## 2021-10-07 DIAGNOSIS — E7800 Pure hypercholesterolemia, unspecified: Secondary | ICD-10-CM | POA: Insufficient documentation

## 2021-10-07 DIAGNOSIS — R197 Diarrhea, unspecified: Secondary | ICD-10-CM | POA: Insufficient documentation

## 2021-10-07 DIAGNOSIS — C7972 Secondary malignant neoplasm of left adrenal gland: Secondary | ICD-10-CM | POA: Insufficient documentation

## 2021-10-07 DIAGNOSIS — Z510 Encounter for antineoplastic radiation therapy: Secondary | ICD-10-CM | POA: Diagnosis not present

## 2021-10-07 DIAGNOSIS — C7971 Secondary malignant neoplasm of right adrenal gland: Secondary | ICD-10-CM | POA: Insufficient documentation

## 2021-10-07 DIAGNOSIS — C771 Secondary and unspecified malignant neoplasm of intrathoracic lymph nodes: Secondary | ICD-10-CM | POA: Insufficient documentation

## 2021-10-07 DIAGNOSIS — C7951 Secondary malignant neoplasm of bone: Secondary | ICD-10-CM | POA: Insufficient documentation

## 2021-10-07 DIAGNOSIS — Z5111 Encounter for antineoplastic chemotherapy: Secondary | ICD-10-CM

## 2021-10-07 DIAGNOSIS — F419 Anxiety disorder, unspecified: Secondary | ICD-10-CM | POA: Insufficient documentation

## 2021-10-07 DIAGNOSIS — J449 Chronic obstructive pulmonary disease, unspecified: Secondary | ICD-10-CM | POA: Insufficient documentation

## 2021-10-07 DIAGNOSIS — C50919 Malignant neoplasm of unspecified site of unspecified female breast: Secondary | ICD-10-CM

## 2021-10-07 DIAGNOSIS — C7800 Secondary malignant neoplasm of unspecified lung: Secondary | ICD-10-CM | POA: Insufficient documentation

## 2021-10-07 DIAGNOSIS — I10 Essential (primary) hypertension: Secondary | ICD-10-CM | POA: Insufficient documentation

## 2021-10-07 DIAGNOSIS — Z923 Personal history of irradiation: Secondary | ICD-10-CM | POA: Insufficient documentation

## 2021-10-07 DIAGNOSIS — C787 Secondary malignant neoplasm of liver and intrahepatic bile duct: Secondary | ICD-10-CM | POA: Insufficient documentation

## 2021-10-07 DIAGNOSIS — T451X5A Adverse effect of antineoplastic and immunosuppressive drugs, initial encounter: Secondary | ICD-10-CM | POA: Diagnosis not present

## 2021-10-07 DIAGNOSIS — Z95828 Presence of other vascular implants and grafts: Secondary | ICD-10-CM | POA: Insufficient documentation

## 2021-10-07 DIAGNOSIS — G4700 Insomnia, unspecified: Secondary | ICD-10-CM | POA: Insufficient documentation

## 2021-10-07 LAB — COMPREHENSIVE METABOLIC PANEL
ALT: 25 U/L (ref 0–44)
AST: 29 U/L (ref 15–41)
Albumin: 3.8 g/dL (ref 3.5–5.0)
Alkaline Phosphatase: 58 U/L (ref 38–126)
Anion gap: 7 (ref 5–15)
BUN: 21 mg/dL (ref 8–23)
CO2: 23 mmol/L (ref 22–32)
Calcium: 8.6 mg/dL — ABNORMAL LOW (ref 8.9–10.3)
Chloride: 105 mmol/L (ref 98–111)
Creatinine, Ser: 0.81 mg/dL (ref 0.44–1.00)
GFR, Estimated: >60 mL/min (ref >60)
Glucose, Bld: 103 mg/dL — ABNORMAL HIGH (ref 70–99)
Potassium: 3.7 mmol/L (ref 3.5–5.1)
Sodium: 135 mmol/L (ref 135–145)
Total Bilirubin: 1.2 mg/dL (ref 0.3–1.2)
Total Protein: 6.8 g/dL (ref 6.5–8.1)

## 2021-10-07 LAB — CBC WITH DIFFERENTIAL/PLATELET
Abs Immature Granulocytes: 0.05 10*3/uL (ref 0.00–0.07)
Basophils Absolute: 0 10*3/uL (ref 0.0–0.1)
Basophils Relative: 0 %
Eosinophils Absolute: 0.1 10*3/uL (ref 0.0–0.5)
Eosinophils Relative: 2 %
HCT: 37.6 % (ref 36.0–46.0)
Hemoglobin: 11.9 g/dL — ABNORMAL LOW (ref 12.0–15.0)
Immature Granulocytes: 1 %
Lymphocytes Relative: 5 %
Lymphs Abs: 0.5 10*3/uL — ABNORMAL LOW (ref 0.7–4.0)
MCH: 28.5 pg (ref 26.0–34.0)
MCHC: 31.6 g/dL (ref 30.0–36.0)
MCV: 90.2 fL (ref 80.0–100.0)
Monocytes Absolute: 0.5 10*3/uL (ref 0.1–1.0)
Monocytes Relative: 6 %
Neutro Abs: 7.7 10*3/uL (ref 1.7–7.7)
Neutrophils Relative %: 86 %
Platelets: 402 10*3/uL — ABNORMAL HIGH (ref 150–400)
RBC: 4.17 MIL/uL (ref 3.87–5.11)
RDW: 20.8 % — ABNORMAL HIGH (ref 11.5–15.5)
WBC: 8.9 10*3/uL (ref 4.0–10.5)
nRBC: 0 % (ref 0.0–0.2)

## 2021-10-07 MED ORDER — GABAPENTIN 300 MG PO CAPS: 600.0000 mg | ORAL_CAPSULE | Freq: Three times a day (TID) | ORAL | 1 refills | Status: DC

## 2021-10-07 NOTE — Progress Notes (Signed)
[place]  Telephone:([phone] [phone]  Patient Care Team: Gayland Curry, MD as PCP - General (Family Medicine) Noreene Filbert, MD as Consulting Physician (Radiation Oncology)   Name of the patient: Amber Blevins  [id number]  Apr 14, 1951   Date of visit: 10/07/21  HPI: Patient is a 71 y.o. female with metastatic breast cancer, ER/PR positive, HER2 negative.  Patient was initially treated with IV chemotherapy due to viseral crisis (last dose 09/16/21), she has since improved and was switched to oral chemotherapy. Currently treated with letrozole and Verzenio (abemaciclib). She started the letrozole on 09/18/21 and the abemaciclib on 09/30/21.  Reason for Consult: Oral chemotherapy follow-up for abemaciclib therapy.   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  MEDICATIONS:  Current Outpatient Medications  Medication Sig Dispense Refill   abemaciclib (VERZENIO) 100 MG tablet Take 1 tablet (100 mg total) by mouth 2 (two) times daily. Swallow tablets whole. Do not chew,  crush, or split tablets before swallowing. 56 tablet 0   acetaminophen (TYLENOL) 650 MG CR tablet Take 650 mg by mouth every 8 (eight) hours as needed for pain.     albuterol (VENTOLIN HFA) 108 (90 Base) MCG/ACT inhaler Inhale 2 puffs into the lungs every 6 (six) hours as needed.     aspirin 81 MG EC tablet Take by mouth.     atorvastatin (LIPITOR) 40 MG tablet Take 40 mg by mouth at bedtime.     Boswellia-Glucosamine-Vit D (OSTEO BI-FLEX-GLUCOS/5-LOXIN) TABS Take 1 capsule by mouth in the morning and at bedtime.     Calcium Carb-Cholecalciferol 600-400 MG-UNIT TABS Take 1 tablet by mouth daily.     diphenoxylate-atropine (LOMOTIL) 2.5-0.025 MG tablet Take 1 tablet by mouth 4 (four) times daily as needed for diarrhea or loose stools. 30 tablet 0   Docusate Sodium (DSS) 100 MG CAPS Take 1 capsule by mouth every other day.     Fluticasone-Umeclidin-Vilant (TRELEGY ELLIPTA) 100-62.5-25 MCG/INH AEPB Inhale into the lungs. Inhale 1 inhalation into the lungs once daily     gabapentin (NEURONTIN) 300 MG capsule Take 2 capsules (600 mg total) by mouth 3 (three) times daily. 180 capsule 1   ibuprofen (ADVIL) 200 MG tablet Take by mouth. Take 400 mg by mouth every six (6) hours as needed for pain.     letrozole (FEMARA) 2.5 MG tablet Take 1 tablet (2.5 mg total) by mouth daily. 90 tablet 1   lidocaine-prilocaine (EMLA) cream Apply 1 application topically as needed. 30 g 5   Loperamide HCl (IMODIUM PO) Take by mouth.  LORazepam (ATIVAN) 0.5 MG tablet Take 1 tablet (0.5 mg total) by mouth at bedtime as needed for anxiety. 30 tablet 0   magnesium chloride (SLOW-MAG) 64 MG TBEC SR tablet Take 1 tablet by mouth at bedtime.     metoprolol-hydrochlorothiazide (LOPRESSOR HCT) 50-25 MG tablet Take 1 tablet by mouth daily. Take 1 tablet by mouth once daily     ondansetron (ZOFRAN) 8 MG tablet Take 1 tablet (8 mg total) by mouth 2 (two) times daily as needed (Nausea or vomiting). 30 tablet 1   oxyCODONE (OXY  IR/ROXICODONE) 5 MG immediate release tablet Take 1 tablet (5 mg total) by mouth every 12 (twelve) hours as needed for severe pain or moderate pain. (Patient not taking: Reported on 09/16/2021) 60 tablet 0   potassium chloride SA (KLOR-CON M) 20 MEQ tablet Take 1 tablet (20 mEq total) by mouth 2 (two) times daily. 60 tablet 0   prochlorperazine (COMPAZINE) 10 MG tablet Take 1 tablet (10 mg total) by mouth every 6 (six) hours as needed (Nausea or vomiting). 30 tablet 1   sucralfate (CARAFATE) 1 GM/10ML suspension Take 10 mLs (1 g total) by mouth 3 (three) times daily. 420 mL 2   Turmeric (QC TUMERIC COMPLEX PO) Take 1 capsule by mouth at bedtime.     No current facility-administered medications for this visit.    VITAL SIGNS: There were no vitals taken for this visit. There were no vitals filed for this visit.  Estimated body mass index is 30.24 kg/m as calculated from the following:   Height as of 08/01/21: 5' 4"  (1.626 m).   Weight as of 09/16/21: 79.9 kg (176 lb 3.2 oz).  LABS: CBC:    Component Value Date/Time   WBC 8.9 10/07/2021 0928   HGB 11.9 (L) 10/07/2021 0928   HCT 37.6 10/07/2021 0928   PLT 402 (H) 10/07/2021 0928   MCV 90.2 10/07/2021 0928   NEUTROABS 7.7 10/07/2021 0928   LYMPHSABS 0.5 (L) 10/07/2021 0928   MONOABS 0.5 10/07/2021 0928   EOSABS 0.1 10/07/2021 0928   BASOSABS 0.0 10/07/2021 0928   Comprehensive Metabolic Panel:    Component Value Date/Time   NA 134 (L) 09/16/2021 0816   K 3.3 (L) 09/16/2021 0816   CL 99 09/16/2021 0816   CO2 27 09/16/2021 0816   BUN 21 09/16/2021 0816   CREATININE 0.64 09/16/2021 0816   GLUCOSE 128 (H) 09/16/2021 0816   CALCIUM 8.5 (L) 09/16/2021 0816   AST 23 09/16/2021 0816   ALT 19 09/16/2021 0816   ALKPHOS 61 09/16/2021 0816   BILITOT 1.5 (H) 09/16/2021 0816   PROT 6.7 09/16/2021 0816   ALBUMIN 3.7 09/16/2021 0816     Present during today's visit: patient and her husband  Assessment and Plan: CBC/CMP reviewed,  continue abemaciclib 148m bid   Oral Chemotherapy Side Effect/Intolerance:  Diarrhea: Since starting, she has had 2 non-consecutive days of diarrhea. Each were managed with the use of loperamide. She did call the office last Sunday because the had 3 loose stools with in 15 mins, she was given prescription for Lomotil, but the loperamide started to work prior to needing the Lomotil. Patient is managing her diarrhea appriorately Fatigue: patient reports feeling overall okay and able to keep up with her normal activities. She moves slower in the morning but feels better as the day goes on No reported nausea  Oral Chemotherapy Adherence: no reported missed doses No patient barriers to medication adherence identified.   New medications: Lomotil, no  DDIs with abemaciclib  Medication Access Issues: no issues, patient fills at [facility], patient now has a PANF grant to cover the cost of her abemaciclib  Patient expressed understanding and was in agreement with this plan. She also understands that She can call clinic at any time with any questions, concerns, or complaints.   Follow-up plan: RTC in 4 weeks  Thank you for allowing me to participate in the care of this very pleasant patient.   Time Total: 15 mins  Visit consisted of counseling and education on dealing with issues of symptom management in the setting of serious and potentially life-threatening illness.Greater than 50%  of this time was spent counseling and coordinating care related to the above assessment and plan.  Signed by: Darl Pikes, PharmD, BCPS, Salley Slaughter, CPP Hematology/Oncology Clinical Pharmacist Practitioner [place]/DB/AP [facility] [phone]  10/07/2021 10:03 AM

## 2021-10-07 NOTE — Progress Notes (Signed)
Hematology/Oncology follow up note [phone] [phone]   Patient Care Team: Amber Curry, MD as PCP - General (Family Medicine) Amber Filbert, MD as Consulting Physician (Radiation Oncology)  REFERRING PROVIDER: Gayland Curry, MD  CHIEF COMPLAINTS/REASON FOR VISIT:  metastatic breast caner  HISTORY OF PRESENTING ILLNESS:   Amber Blevins is a  71 y.o.  female with PMH listed below was seen in consultation at the request of  Amber Curry, MD  for evaluation of tumor in lumbar vertebrae  # patient reports feeling tightness and pain in Feb 2022. She was started on conservative management which did not help her symptoms. She also started to experience numbness.  She takes gabapentin which partially relieve the discomfort. She denies bowel or bladder incontinence, no lower extremity weakness.   06/25/2021 MRI lumbar spine IMPRESSION:  1. 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 Additional metastatic involvement T12, L1 through L3.  No primary tumor site identified. Top differential considerations are Metastatic Disease Unknown primary, less likely Lymphoma or Multiple Myeloma.   2. Superimposed lumbar spine degeneration, including degenerative moderate to severe left L3 and L4 nerve level impingement from disc herniation.    Patient was seen by neurosurgeon Amber Blevins today. He has ordered MRI cervical and thoracic spine and sacrum biopsy for further work up.   Patient has a history of breast cancer, diagnosed in 2010 Left-sided T2 (2.4cm) N0, ER/PR positive, HER2 negative IDC of the breast, s/p lumpectomy by Dr.Meyer at [facility]. Oncotype score of 11 who completed radiation and she finished 10 years  of Femara from 05/2009 and stopped in 2020.  she has intentionally lost some weight, no night sweating, fever.   # 07/02/2021, CT chest abdomen pelvis showed innumerable small pulmonary and pleural nodules consistent with diffuse metastasis.  Associated with probable malignant pleural effusion.  Mediastinal and hilar lymphadenopathy consistent with metastatic disease.  Hepatic and bilateral adrenal gland metastasis.  Diffuse extensive destructive metastatic bone disease involving pelvis.   07/11/2021 - 07/14/2021  Patient was admitted to hospital due to shortness of breath. 07/12/2021, CT chest PE protocol showed no PE.  Further increase of March pleural effusion and admit to moderate interstitial pulmonary edema.  Bulky mediastinal and hilar lymphadenopathy.  Unchanged.  Incompletely visualized left adrenal nodule. 07/12/2021 patient underwent right thoracentesis.  Cytology was positive for metastatic carcinoma, compatible with breast origin.  ER/PR +, HER2 negative   07/16/2021, patient underwent iliac bone biopsy.  Positive for metastatic carcinoma. 07/25/2021, patient underwent thoracentesis of right side, removed 550 cc straw-colored pleural fluid.  She tolerated procedure well.  09/04/2021, CT chest abdomen pelvis showed improvement of the pulmonary nodularity and thickening in the left and the right lung.  Persistent nodular interstitial and pleural thickening remains.  Improvement of mediastinal and hilar adenopathy, hepatic metastatic lesions are similar.  Adrenal gland metastasis is similar.  Interval increase in the sclerotic bone metastasis.  Discussed with patient that I would like to proceed with 2 additional cycles of [facility].  09/09/2021 [facility] 09/16/2021 [facility]  INTERVAL HISTORY SETSUKO Blevins is a 71 y.o. female who has above history reviewed by me today presents for follow up visit for management of metastatic breast cancer . Patient reports feeling well. 09/30/2021, started  on abemaciclib 100 mg twice daily.  So far she  tolerates well except diarrhea. 3 days after starting abemaciclib, she experienced 1 loose bowel movement and she took Imodium as instructed.  Diarrhea subsided.  2 days later, patient experienced additional episodes of loose bowel movements for which she took Imodium with symptom relief.  She did call on-call physician and had in a prescription of [facility] which she has not used yet.  Today there is no diarrhea.  Denies any nausea vomiting diarrhea. Breathing is at the baseline. She takes gabapentin for neuropathy symptoms   Review of Systems  Constitutional:  Negative for appetite change, chills, fatigue and fever.  HENT:   Negative for hearing loss and voice change.   Eyes:  Negative for eye problems.  Respiratory:  Negative for chest tightness and cough.   Cardiovascular:  Negative for chest pain.  Gastrointestinal:  Negative for abdominal distention, abdominal pain and blood in stool.  Endocrine: Negative for hot flashes.  Genitourinary:  Negative for difficulty urinating and frequency.   Musculoskeletal:  Negative for arthralgias and back pain.       Lower back/sacrum pain and numbness  Skin:  Negative for itching and rash.  Neurological:  Negative for extremity weakness.  Hematological:  Negative for adenopathy.  Psychiatric/Behavioral:  Negative for confusion.    MEDICAL HISTORY:  Past Medical History:  Diagnosis Date   Breast cancer ([facility])    COPD (chronic obstructive pulmonary disease) ([facility])    Family history of adverse reaction to anesthesia    brother has problem with coming out of anesthesia   High cholesterol    Hypertension    Personal history of radiation therapy    Pre-diabetes     SURGICAL HISTORY: Past Surgical History:  Procedure Laterality Date   BREAST LUMPECTOMY Left    2010   COLONOSCOPY     EYE SURGERY Left    Cataract surgery   PORTACATH PLACEMENT N/A 08/01/2021   Procedure: INSERTION PORT-A-CATH;   Surgeon: Amber Pun, MD;  Location: [facility] ORS;  Service: General;  Laterality: N/A;   thorocentesis Right 07/10/2021   and another on 07/25/21    SOCIAL HISTORY: Social History   Socioeconomic History   Marital status: Married    Spouse name: Not on file   Number of children: Not on file   Years of education: Not on file   Highest education level: Not on file  Occupational History   Not on file  Tobacco Use   Smoking status: Former    Packs/day: 1.00    Years: 25.00    Pack years: 25.00    Types: Cigarettes    Quit date: 63    Years since quitting: 33.0   Smokeless tobacco: Never  Vaping Use   Vaping Use: Never used  Substance and Sexual Activity   Alcohol use: Not Currently   Drug use: Never   Sexual activity: Not on file  Other Topics Concern   Not on file  Social History Narrative   Not on file   Social Determinants of Health   Financial Resource Strain: Not on file  Food Insecurity: Not on file  Transportation Needs: Not on file  Physical Activity: Not on file  Stress: Not on file  Social Connections: Not on file  Intimate Partner Violence: Not on file    FAMILY HISTORY: Family History  Problem Relation Age of Onset   Cancer Mother        gynecological   Lung cancer Mother    Diabetes Father    Heart disease Father  Parkinson's disease Father    Brain cancer Brother    Bladder Cancer Brother    Pulmonary disease Brother    Rheumatic fever Brother    Breast cancer Neg Hx     ALLERGIES:  is allergic to green tea (camellia sinensis), melaleuca viridiflora, and lisinopril.  MEDICATIONS:  Current Outpatient Medications  Medication Sig Dispense Refill   abemaciclib (VERZENIO) 100 MG tablet Take 1 tablet (100 mg total) by mouth 2 (two) times daily. Swallow tablets whole. Do not chew, crush, or split tablets before swallowing. 56 tablet 0   acetaminophen (TYLENOL) 650 MG CR tablet Take 650 mg by mouth every 8 (eight) hours as needed for  pain.     albuterol (VENTOLIN HFA) 108 (90 Base) MCG/ACT inhaler Inhale 2 puffs into the lungs every 6 (six) hours as needed.     aspirin 81 MG EC tablet Take by mouth.     atorvastatin (LIPITOR) 40 MG tablet Take 40 mg by mouth at bedtime.     Boswellia-Glucosamine-Vit D (OSTEO BI-FLEX-GLUCOS/5-LOXIN) TABS Take 1 capsule by mouth in the morning and at bedtime.     Calcium Carb-Cholecalciferol 600-400 MG-UNIT TABS Take 1 tablet by mouth daily.     diphenoxylate-atropine ([facility]) 2.5-0.025 MG tablet Take 1 tablet by mouth 4 (four) times daily as needed for diarrhea or loose stools. 30 tablet 0   Docusate Sodium (DSS) 100 MG CAPS Take 1 capsule by mouth every other day.     Fluticasone-Umeclidin-Vilant (TRELEGY ELLIPTA) 100-62.5-25 MCG/INH AEPB Inhale into the lungs. Inhale 1 inhalation into the lungs once daily     ibuprofen (ADVIL) 200 MG tablet Take by mouth. Take 400 mg by mouth every six (6) hours as needed for pain.     letrozole (FEMARA) 2.5 MG tablet Take 1 tablet (2.5 mg total) by mouth daily. 90 tablet 1   lidocaine-prilocaine (EMLA) cream Apply 1 application topically as needed. 30 g 5   Loperamide HCl (IMODIUM PO) Take by mouth.     LORazepam (ATIVAN) 0.5 MG tablet Take 1 tablet (0.5 mg total) by mouth at bedtime as needed for anxiety. 30 tablet 0   magnesium chloride (SLOW-MAG) 64 MG TBEC SR tablet Take 1 tablet by mouth at bedtime.     metoprolol-hydrochlorothiazide (LOPRESSOR HCT) 50-25 MG tablet Take 1 tablet by mouth daily. Take 1 tablet by mouth once daily     ondansetron (ZOFRAN) 8 MG tablet Take 1 tablet (8 mg total) by mouth 2 (two) times daily as needed (Nausea or vomiting). 30 tablet 1   potassium chloride SA (KLOR-CON M) 20 MEQ tablet Take 1 tablet (20 mEq total) by mouth 2 (two) times daily. 60 tablet 0   prochlorperazine (COMPAZINE) 10 MG tablet Take 1 tablet (10 mg total) by mouth every 6 (six) hours as needed (Nausea or vomiting). 30 tablet 1   sucralfate (CARAFATE) 1  GM/10ML suspension Take 10 mLs (1 g total) by mouth 3 (three) times daily. 420 mL 2   Turmeric (QC TUMERIC COMPLEX PO) Take 1 capsule by mouth at bedtime.     gabapentin (NEURONTIN) 300 MG capsule Take 2 capsules (600 mg total) by mouth 3 (three) times daily. 180 capsule 1   oxyCODONE (OXY IR/ROXICODONE) 5 MG immediate release tablet Take 1 tablet (5 mg total) by mouth every 12 (twelve) hours as needed for severe pain or moderate pain. (Patient not taking: Reported on 09/16/2021) 60 tablet 0   No current facility-administered medications for this visit.  PHYSICAL EXAMINATION: ECOG PERFORMANCE STATUS: 1 - Symptomatic but completely ambulatory Vitals:   10/07/21 1013  BP: 106/71  Pulse: 84  Temp: 98.9 F (37.2 C)   Filed Weights   10/07/21 1013  Weight: 172 lb 6.4 oz (78.2 kg)    Physical Exam Constitutional:      General: She is not in acute distress. HENT:     Head: Normocephalic and atraumatic.  Eyes:     General: No scleral icterus. Cardiovascular:     Rate and Rhythm: Normal rate and regular rhythm.     Heart sounds: Normal heart sounds.  Pulmonary:     Effort: Pulmonary effort is normal. No respiratory distress.     Breath sounds: No wheezing.     Comments: Decreased breath sound bibasilar Abdominal:     General: Bowel sounds are normal. There is no distension.     Palpations: Abdomen is soft.  Musculoskeletal:        General: No deformity. Normal range of motion.     Cervical back: Normal range of motion and neck supple.     Comments: Left lower extremity edema 1+, no tenderness.  Skin:    General: Skin is warm and dry.     Findings: No erythema or rash.  Neurological:     Mental Status: She is alert and oriented to person, place, and time. Mental status is at baseline.     Cranial Nerves: No cranial nerve deficit.     Coordination: Coordination normal.  Psychiatric:        Mood and Affect: Mood normal.    LABORATORY DATA:  I have reviewed the data as  listed Lab Results  Component Value Date   WBC 8.9 10/07/2021   HGB 11.9 (L) 10/07/2021   HCT 37.6 10/07/2021   MCV 90.2 10/07/2021   PLT 402 (H) 10/07/2021   Recent Labs    09/09/21 0824 09/16/21 0816 10/07/21 0928  NA 134* 134* 135  K 3.0* 3.3* 3.7  CL 99 99 105  CO2 26 27 23   GLUCOSE 126* 128* 103*  BUN 22 21 21   CREATININE 0.61 0.64 0.81  CALCIUM 8.6* 8.5* 8.6*  GFRNONAA >60 >60 >60  PROT 6.3* 6.7 6.8  ALBUMIN 3.6 3.7 3.8  AST 27 23 29   ALT 22 19 25   ALKPHOS 73 61 58  BILITOT 1.0 1.5* 1.2    Iron/TIBC/Ferritin/ %Sat No results found for: IRON, TIBC, FERRITIN, IRONPCTSAT    RADIOGRAPHIC STUDIES: I have personally reviewed the radiological images as listed and agreed with the findings in the report. No results found.    ASSESSMENT & PLAN:  1. Metastatic breast cancer ([place])   2. Metastasis to bone ([place])   3. Encounter for antineoplastic chemotherapy   4. Port-A-Cath in place   5. Chemotherapy induced diarrhea    # Metastatic Breast cancer with extensive thoracic and bone involvement, in visceral crisis, ER+, PR+ HER2 neg Labs reviewed and discussed patient. Continue abemaciclib 100 mg twice daily.  #Chemotherapy-induced diarrhea, continue Imodium and/or [facility] as instructed as needed.   #Grade 2 neuropathy, continue gabapentin, she may increase further to 600 mg 3 times daily.  #Extensive bone metastasis.  Status post palliative radiation to lumbar/sacrum Status post radiation to thoracic spine and cervical spine. Has been tapered off dexamethasone. Status post Zometa 09/02/2021.   Patient will start cervical spine radiation.  #Hypocalcemia secondary to Zometa use.  Continue calcium supplementation.  #Anxiety and insomnia, continue low-dose Ativan 0.5 mg QHS PRN #Left  lower extremity swelling, no DVT on ultrasound.  Encourage leg elevation. #Port-A-Cath in place, recommend port flush every 8 weeks.  All questions were answered. The patient knows  to call the clinic with any problems questions or concerns.  cc Amber Curry, MD    Follow up  CBC in 2 weeks   Follow-up in 4 weeks, lab MD  treatments.   [name], MD, PhD  10/07/2021

## 2021-10-07 NOTE — Progress Notes (Signed)
Nutrition Follow-up:  Patient with metastatic breast cancer with tumor in lumbar vertebrae, pulmonary nodules, hepatic and bilateral adrenal gland, and bone.  Patient started on abemaciclib.    Met with patient following MD visit.  Patient reports that she has an appointment but volume is less.  Trying to eat small frequent meals/snack during the day.  Likes premier protein shakes and drinks 1 a day.  Snacks on protein bars.  Has been having soups, recently meatloaf and mashed potatoes, italian wedding soup, chicken noodle soup.  Says that she has been having a hard time with breakfast.  Reports no nausea, mild diarrhea and has imodium and lomotil.  Has been keeping food diary and tracking protein grams.      Medications: lomotil, imodium  Labs: reviewed  Anthropometrics:   Weight 172 lb 6.4 oz today   175 lb 11 oz on 12/13 179 lb on 11/29  180 lb UBW   NUTRITION DIAGNOSIS: Inadequate oral intake continues    INTERVENTION:  Continue keeping food diary to track progress Continue oral nutrition supplement     MONITORING, EVALUATION, GOAL: weight loss, intake   NEXT VISIT: Tuesday, Feb 7th after MD visit  Jaylan Hinojosa B. Zenia Resides, [place], [place] Registered Dietitian [phone] (mobile)

## 2021-10-08 ENCOUNTER — Encounter: Payer: Self-pay | Admitting: Oncology

## 2021-10-08 ENCOUNTER — Ambulatory Visit
Admission: RE | Admit: 2021-10-08 | Discharge: 2021-10-08 | Disposition: A | Discharge status: Home / Self Care | Payer: Medicare PPO | Source: Ambulatory Visit | Attending: Radiation Oncology | Admitting: Radiation Oncology

## 2021-10-08 ENCOUNTER — Other Ambulatory Visit (HOSPITAL_COMMUNITY): Payer: Self-pay

## 2021-10-08 DIAGNOSIS — Z510 Encounter for antineoplastic radiation therapy: Secondary | ICD-10-CM | POA: Diagnosis not present

## 2021-10-09 ENCOUNTER — Ambulatory Visit
Admission: RE | Admit: 2021-10-09 | Discharge: 2021-10-09 | Disposition: A | Discharge status: Home / Self Care | Payer: Medicare PPO | Source: Ambulatory Visit | Attending: Radiation Oncology | Admitting: Radiation Oncology

## 2021-10-09 DIAGNOSIS — Z510 Encounter for antineoplastic radiation therapy: Secondary | ICD-10-CM | POA: Diagnosis not present

## 2021-10-10 ENCOUNTER — Ambulatory Visit
Admission: RE | Admit: 2021-10-10 | Discharge: 2021-10-10 | Disposition: A | Discharge status: Home / Self Care | Payer: Medicare PPO | Source: Ambulatory Visit | Attending: Radiation Oncology | Admitting: Radiation Oncology

## 2021-10-10 DIAGNOSIS — Z510 Encounter for antineoplastic radiation therapy: Secondary | ICD-10-CM | POA: Diagnosis not present

## 2021-10-13 ENCOUNTER — Ambulatory Visit
Admission: RE | Admit: 2021-10-13 | Discharge: 2021-10-13 | Disposition: A | Discharge status: Home / Self Care | Payer: Medicare PPO | Source: Ambulatory Visit | Attending: Radiation Oncology | Admitting: Radiation Oncology

## 2021-10-13 DIAGNOSIS — Z510 Encounter for antineoplastic radiation therapy: Secondary | ICD-10-CM | POA: Diagnosis not present

## 2021-10-14 ENCOUNTER — Ambulatory Visit
Admission: RE | Admit: 2021-10-14 | Discharge: 2021-10-14 | Disposition: A | Discharge status: Home / Self Care | Payer: Medicare PPO | Source: Ambulatory Visit | Attending: Radiation Oncology | Admitting: Radiation Oncology

## 2021-10-14 DIAGNOSIS — Z510 Encounter for antineoplastic radiation therapy: Secondary | ICD-10-CM | POA: Diagnosis not present

## 2021-10-15 ENCOUNTER — Ambulatory Visit
Admission: RE | Admit: 2021-10-15 | Discharge: 2021-10-15 | Disposition: A | Discharge status: Home / Self Care | Payer: Medicare PPO | Source: Ambulatory Visit | Attending: Radiation Oncology | Admitting: Radiation Oncology

## 2021-10-15 DIAGNOSIS — Z510 Encounter for antineoplastic radiation therapy: Secondary | ICD-10-CM | POA: Diagnosis not present

## 2021-10-16 ENCOUNTER — Ambulatory Visit
Admission: RE | Admit: 2021-10-16 | Discharge: 2021-10-16 | Disposition: A | Discharge status: Home / Self Care | Payer: Medicare PPO | Source: Ambulatory Visit | Attending: Radiation Oncology | Admitting: Radiation Oncology

## 2021-10-16 ENCOUNTER — Other Ambulatory Visit: Payer: Self-pay | Admitting: Oncology

## 2021-10-16 DIAGNOSIS — Z510 Encounter for antineoplastic radiation therapy: Secondary | ICD-10-CM | POA: Diagnosis not present

## 2021-10-16 NOTE — Telephone Encounter (Signed)
°  Component Ref Range & Units 9 d ago (10/07/21) 1 mo ago (09/16/21) 1 mo ago (09/09/21) 1 mo ago (09/02/21) 1 mo ago (08/26/21) 1 mo ago (08/18/21) 2 mo ago (08/11/21)  Potassium 3.5 - 5.1 mmol/L 3.7  3.3 Low   3.0 Low   3.5

## 2021-10-17 ENCOUNTER — Ambulatory Visit
Admission: RE | Admit: 2021-10-17 | Discharge: 2021-10-17 | Disposition: A | Discharge status: Home / Self Care | Payer: Medicare PPO | Source: Ambulatory Visit | Attending: Radiation Oncology | Admitting: Radiation Oncology

## 2021-10-17 DIAGNOSIS — Z510 Encounter for antineoplastic radiation therapy: Secondary | ICD-10-CM | POA: Diagnosis not present

## 2021-10-18 ENCOUNTER — Encounter: Payer: Self-pay | Admitting: Pharmacist

## 2021-10-20 ENCOUNTER — Ambulatory Visit
Admission: RE | Admit: 2021-10-20 | Discharge: 2021-10-20 | Disposition: A | Discharge status: Home / Self Care | Payer: Medicare PPO | Source: Ambulatory Visit | Attending: Radiation Oncology | Admitting: Radiation Oncology

## 2021-10-20 ENCOUNTER — Other Ambulatory Visit (HOSPITAL_COMMUNITY): Payer: Self-pay

## 2021-10-20 DIAGNOSIS — Z510 Encounter for antineoplastic radiation therapy: Secondary | ICD-10-CM | POA: Diagnosis not present

## 2021-10-21 ENCOUNTER — Inpatient Hospital Stay: Payer: Medicare PPO

## 2021-10-21 ENCOUNTER — Other Ambulatory Visit: Payer: Self-pay

## 2021-10-21 ENCOUNTER — Other Ambulatory Visit: Payer: Self-pay | Admitting: Oncology

## 2021-10-21 ENCOUNTER — Other Ambulatory Visit (HOSPITAL_COMMUNITY): Payer: Self-pay

## 2021-10-21 DIAGNOSIS — Z510 Encounter for antineoplastic radiation therapy: Secondary | ICD-10-CM | POA: Diagnosis not present

## 2021-10-21 DIAGNOSIS — C50919 Malignant neoplasm of unspecified site of unspecified female breast: Secondary | ICD-10-CM

## 2021-10-21 LAB — CBC WITH DIFFERENTIAL/PLATELET
Abs Immature Granulocytes: 0.02 10*3/uL (ref 0.00–0.07)
Basophils Absolute: 0 10*3/uL (ref 0.0–0.1)
Basophils Relative: 1 %
Eosinophils Absolute: 0.2 10*3/uL (ref 0.0–0.5)
Eosinophils Relative: 5 %
HCT: 39.5 % (ref 36.0–46.0)
Hemoglobin: 12.5 g/dL (ref 12.0–15.0)
Immature Granulocytes: 0 %
Lymphocytes Relative: 8 %
Lymphs Abs: 0.4 10*3/uL — ABNORMAL LOW (ref 0.7–4.0)
MCH: 29.8 pg (ref 26.0–34.0)
MCHC: 31.6 g/dL (ref 30.0–36.0)
MCV: 94 fL (ref 80.0–100.0)
Monocytes Absolute: 0.3 10*3/uL (ref 0.1–1.0)
Monocytes Relative: 7 %
Neutro Abs: 3.9 10*3/uL (ref 1.7–7.7)
Neutrophils Relative %: 79 %
Platelets: 192 10*3/uL (ref 150–400)
RBC: 4.2 MIL/uL (ref 3.87–5.11)
RDW: 21 % — ABNORMAL HIGH (ref 11.5–15.5)
WBC: 4.9 10*3/uL (ref 4.0–10.5)
nRBC: 0 % (ref 0.0–0.2)

## 2021-10-21 LAB — COMPREHENSIVE METABOLIC PANEL
ALT: 28 U/L (ref 0–44)
AST: 29 U/L (ref 15–41)
Albumin: 4.1 g/dL (ref 3.5–5.0)
Alkaline Phosphatase: 62 U/L (ref 38–126)
Anion gap: 10 (ref 5–15)
BUN: 24 mg/dL — ABNORMAL HIGH (ref 8–23)
CO2: 27 mmol/L (ref 22–32)
Calcium: 9.4 mg/dL (ref 8.9–10.3)
Chloride: 101 mmol/L (ref 98–111)
Creatinine, Ser: 0.81 mg/dL (ref 0.44–1.00)
GFR, Estimated: >60 mL/min (ref >60)
Glucose, Bld: 100 mg/dL — ABNORMAL HIGH (ref 70–99)
Potassium: 4 mmol/L (ref 3.5–5.1)
Sodium: 138 mmol/L (ref 135–145)
Total Bilirubin: 1 mg/dL (ref 0.3–1.2)
Total Protein: 6.8 g/dL (ref 6.5–8.1)

## 2021-10-22 ENCOUNTER — Other Ambulatory Visit (HOSPITAL_COMMUNITY): Payer: Self-pay

## 2021-10-22 ENCOUNTER — Other Ambulatory Visit: Payer: Self-pay | Admitting: Oncology

## 2021-10-22 DIAGNOSIS — C50919 Malignant neoplasm of unspecified site of unspecified female breast: Secondary | ICD-10-CM

## 2021-10-22 MED FILL — Abemaciclib Tab 100 MG: ORAL | 28 days supply | Qty: 56 | Fill #0 | Status: AC

## 2021-10-23 ENCOUNTER — Other Ambulatory Visit (HOSPITAL_COMMUNITY): Payer: Self-pay

## 2021-10-26 ENCOUNTER — Other Ambulatory Visit: Payer: Self-pay | Admitting: Radiation Oncology

## 2021-10-27 ENCOUNTER — Other Ambulatory Visit: Payer: Self-pay | Admitting: *Deleted

## 2021-10-27 ENCOUNTER — Telehealth: Payer: Self-pay | Admitting: *Deleted

## 2021-10-27 MED ORDER — SUCRALFATE 1 GM/10ML PO SUSP: 1.0000 g | Freq: Three times a day (TID) | ORAL | 2 refills | Status: DC

## 2021-10-27 MED ORDER — DEXAMETHASONE 2 MG PO TABS: 4.0000 mg | ORAL_TABLET | Freq: Two times a day (BID) | ORAL | 0 refills | Status: DC

## 2021-10-27 NOTE — Telephone Encounter (Signed)
Returned call to patient and told her per Dr. Baruch Gouty to try advil and continue sucralfate.   Also informed her it may take a few more days to improve.

## 2021-10-27 NOTE — Telephone Encounter (Signed)
Patient called stating she completed radiation therapy a week ago and that her throat is still raw. She is asking what can be done for it. Please advise

## 2021-11-04 ENCOUNTER — Inpatient Hospital Stay: Payer: Medicare PPO | Attending: Oncology

## 2021-11-04 ENCOUNTER — Encounter: Payer: Self-pay | Admitting: Oncology

## 2021-11-04 ENCOUNTER — Inpatient Hospital Stay: Payer: Medicare PPO

## 2021-11-04 ENCOUNTER — Inpatient Hospital Stay: Payer: Medicare PPO | Admitting: Pharmacist

## 2021-11-04 ENCOUNTER — Other Ambulatory Visit: Payer: Self-pay

## 2021-11-04 ENCOUNTER — Inpatient Hospital Stay: Payer: Medicare PPO | Admitting: Oncology

## 2021-11-04 VITALS — BP 100/64 | HR 64 | Temp 97.9°F | Resp 18 | Wt 170.2 lb

## 2021-11-04 DIAGNOSIS — C7951 Secondary malignant neoplasm of bone: Secondary | ICD-10-CM | POA: Diagnosis present

## 2021-11-04 DIAGNOSIS — C50919 Malignant neoplasm of unspecified site of unspecified female breast: Secondary | ICD-10-CM

## 2021-11-04 DIAGNOSIS — Z853 Personal history of malignant neoplasm of breast: Secondary | ICD-10-CM | POA: Diagnosis present

## 2021-11-04 DIAGNOSIS — Z79811 Long term (current) use of aromatase inhibitors: Secondary | ICD-10-CM | POA: Insufficient documentation

## 2021-11-04 DIAGNOSIS — Z170 Estrogen receptor positive status [ER+]: Secondary | ICD-10-CM | POA: Diagnosis not present

## 2021-11-04 DIAGNOSIS — I10 Essential (primary) hypertension: Secondary | ICD-10-CM | POA: Diagnosis not present

## 2021-11-04 DIAGNOSIS — G893 Neoplasm related pain (acute) (chronic): Secondary | ICD-10-CM | POA: Insufficient documentation

## 2021-11-04 DIAGNOSIS — C7800 Secondary malignant neoplasm of unspecified lung: Secondary | ICD-10-CM | POA: Diagnosis present

## 2021-11-04 DIAGNOSIS — Z95828 Presence of other vascular implants and grafts: Secondary | ICD-10-CM

## 2021-11-04 DIAGNOSIS — F419 Anxiety disorder, unspecified: Secondary | ICD-10-CM | POA: Diagnosis not present

## 2021-11-04 DIAGNOSIS — C7970 Secondary malignant neoplasm of unspecified adrenal gland: Secondary | ICD-10-CM | POA: Insufficient documentation

## 2021-11-04 DIAGNOSIS — R197 Diarrhea, unspecified: Secondary | ICD-10-CM | POA: Diagnosis not present

## 2021-11-04 DIAGNOSIS — J449 Chronic obstructive pulmonary disease, unspecified: Secondary | ICD-10-CM | POA: Insufficient documentation

## 2021-11-04 DIAGNOSIS — G620 Drug-induced polyneuropathy: Secondary | ICD-10-CM | POA: Insufficient documentation

## 2021-11-04 DIAGNOSIS — J910 Malignant pleural effusion: Secondary | ICD-10-CM | POA: Diagnosis not present

## 2021-11-04 DIAGNOSIS — Z923 Personal history of irradiation: Secondary | ICD-10-CM | POA: Insufficient documentation

## 2021-11-04 DIAGNOSIS — G4700 Insomnia, unspecified: Secondary | ICD-10-CM | POA: Insufficient documentation

## 2021-11-04 DIAGNOSIS — Z79899 Other long term (current) drug therapy: Secondary | ICD-10-CM | POA: Diagnosis not present

## 2021-11-04 DIAGNOSIS — K521 Toxic gastroenteritis and colitis: Secondary | ICD-10-CM | POA: Diagnosis not present

## 2021-11-04 DIAGNOSIS — C787 Secondary malignant neoplasm of liver and intrahepatic bile duct: Secondary | ICD-10-CM | POA: Insufficient documentation

## 2021-11-04 DIAGNOSIS — Z87891 Personal history of nicotine dependence: Secondary | ICD-10-CM | POA: Insufficient documentation

## 2021-11-04 DIAGNOSIS — M5126 Other intervertebral disc displacement, lumbar region: Secondary | ICD-10-CM | POA: Diagnosis not present

## 2021-11-04 DIAGNOSIS — R944 Abnormal results of kidney function studies: Secondary | ICD-10-CM | POA: Diagnosis not present

## 2021-11-04 DIAGNOSIS — R600 Localized edema: Secondary | ICD-10-CM | POA: Diagnosis not present

## 2021-11-04 DIAGNOSIS — E7800 Pure hypercholesterolemia, unspecified: Secondary | ICD-10-CM | POA: Insufficient documentation

## 2021-11-04 DIAGNOSIS — Z5111 Encounter for antineoplastic chemotherapy: Secondary | ICD-10-CM

## 2021-11-04 DIAGNOSIS — T451X5A Adverse effect of antineoplastic and immunosuppressive drugs, initial encounter: Secondary | ICD-10-CM

## 2021-11-04 DIAGNOSIS — C771 Secondary and unspecified malignant neoplasm of intrathoracic lymph nodes: Secondary | ICD-10-CM | POA: Insufficient documentation

## 2021-11-04 LAB — CBC WITH DIFFERENTIAL/PLATELET
Abs Immature Granulocytes: 0.02 10*3/uL (ref 0.00–0.07)
Basophils Absolute: 0.1 10*3/uL (ref 0.0–0.1)
Basophils Relative: 1 %
Eosinophils Absolute: 0.2 10*3/uL (ref 0.0–0.5)
Eosinophils Relative: 3 %
HCT: 42.1 % (ref 36.0–46.0)
Hemoglobin: 13.5 g/dL (ref 12.0–15.0)
Immature Granulocytes: 0 %
Lymphocytes Relative: 9 %
Lymphs Abs: 0.5 10*3/uL — ABNORMAL LOW (ref 0.7–4.0)
MCH: 29.9 pg (ref 26.0–34.0)
MCHC: 32.1 g/dL (ref 30.0–36.0)
MCV: 93.3 fL (ref 80.0–100.0)
Monocytes Absolute: 0.5 10*3/uL (ref 0.1–1.0)
Monocytes Relative: 8 %
Neutro Abs: 4.8 10*3/uL (ref 1.7–7.7)
Neutrophils Relative %: 79 %
Platelets: 294 10*3/uL (ref 150–400)
RBC: 4.51 MIL/uL (ref 3.87–5.11)
RDW: 19.7 % — ABNORMAL HIGH (ref 11.5–15.5)
WBC: 6 10*3/uL (ref 4.0–10.5)
nRBC: 0 % (ref 0.0–0.2)

## 2021-11-04 LAB — COMPREHENSIVE METABOLIC PANEL
ALT: 43 U/L (ref 0–44)
AST: 34 U/L (ref 15–41)
Albumin: 3.9 g/dL (ref 3.5–5.0)
Alkaline Phosphatase: 61 U/L (ref 38–126)
Anion gap: 10 (ref 5–15)
BUN: 21 mg/dL (ref 8–23)
CO2: 29 mmol/L (ref 22–32)
Calcium: 9.4 mg/dL (ref 8.9–10.3)
Chloride: 98 mmol/L (ref 98–111)
Creatinine, Ser: 1.04 mg/dL — ABNORMAL HIGH (ref 0.44–1.00)
GFR, Estimated: 57 mL/min — ABNORMAL LOW (ref >60)
Glucose, Bld: 82 mg/dL (ref 70–99)
Potassium: 4.4 mmol/L (ref 3.5–5.1)
Sodium: 137 mmol/L (ref 135–145)
Total Bilirubin: 1.1 mg/dL (ref 0.3–1.2)
Total Protein: 6.5 g/dL (ref 6.5–8.1)

## 2021-11-04 NOTE — Progress Notes (Signed)
Hematology/Oncology Progress note [phone] [phone]      Patient Care Team: Gayland Curry, MD as PCP - General (Family Medicine) Noreene Filbert, MD as Consulting Physician (Radiation Oncology)  REFERRING PROVIDER: Gayland Curry, MD  CHIEF COMPLAINTS/REASON FOR VISIT:  metastatic breast caner  HISTORY OF PRESENTING ILLNESS:   Amber Blevins is a  71 y.o.  female with PMH listed below was seen in consultation at the request of  Gayland Curry, MD  for evaluation of tumor in lumbar vertebrae  # patient reports feeling tightness and pain in Feb 2022. She was started on conservative management which did not help her symptoms. She also started to experience numbness.  She takes gabapentin which partially relieve the discomfort. She denies bowel or bladder incontinence, no lower extremity weakness.   06/25/2021 MRI lumbar spine IMPRESSION:  1. 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 Additional metastatic involvement T12, L1 through L3.  No primary tumor site identified. Top differential considerations are Metastatic Disease Unknown primary, less likely Lymphoma or Multiple Myeloma.   2. Superimposed lumbar spine degeneration, including degenerative moderate to severe left L3 and L4 nerve level impingement from disc herniation.    Patient was seen by neurosurgeon Dr.Yarbrough today. He has ordered MRI cervical and thoracic spine and sacrum biopsy for further work up.   Patient has a history of breast cancer, diagnosed in 2010 Left-sided T2 (2.4cm) N0, ER/PR positive, HER2 negative IDC of the breast, s/p lumpectomy by Dr.Meyer at [facility]. Oncotype score of 11 who completed radiation and she finished 10  years of Femara from 05/2009 and stopped in 2020.  she has intentionally lost some weight, no night sweating, fever.   # 07/02/2021, CT chest abdomen pelvis showed innumerable small pulmonary and pleural nodules consistent with diffuse metastasis.  Associated with probable malignant pleural effusion.  Mediastinal and hilar lymphadenopathy consistent with metastatic disease.  Hepatic and bilateral adrenal gland metastasis.  Diffuse extensive destructive metastatic bone disease involving pelvis.   07/11/2021 - 07/14/2021  Patient was admitted to hospital due to shortness of breath. 07/12/2021, CT chest PE protocol showed no PE.  Further increase of March pleural effusion and admit to moderate interstitial pulmonary edema.  Bulky mediastinal and hilar lymphadenopathy.  Unchanged.  Incompletely visualized left adrenal nodule. 07/12/2021 patient underwent right thoracentesis.  Cytology was positive for metastatic carcinoma, compatible with breast origin.  ER/PR +, HER2 negative   07/16/2021, patient underwent iliac bone biopsy.  Positive for metastatic carcinoma. 07/25/2021, patient underwent thoracentesis of right side, removed 550 cc straw-colored pleural fluid.  She tolerated procedure well.  09/04/2021, CT chest abdomen pelvis showed improvement of the pulmonary nodularity and thickening in the left and the right lung.  Persistent nodular interstitial and pleural thickening remains.  Improvement of mediastinal and hilar adenopathy, hepatic metastatic lesions are similar.  Adrenal gland metastasis is similar.  Interval increase in the sclerotic bone metastasis.  Discussed with patient that I would like to proceed with 2 additional cycles of Taxol.  09/09/2021 Taxol 09/16/2021 Taxol  09/30/2021, started on abemaciclib 100 mg twice daily and letrozole.  INTERVAL HISTORY Amber Blevins is a 71 y.o. female who has above history reviewed by me today presents for follow up visit for management of  metastatic breast cancer . Patient reports feeling  well. Patient is on abemaciclib and letrozole. She tolerates well overall.  Denies shortness of breath, nausea vomiting.  She has intermittent diarrhea for which she takes Imodium.  She reports that Imodium caused her to have constipation. She takes gabapentin for neuropathy symptoms.   Review of Systems  Constitutional:  Negative for appetite change, chills, fatigue and fever.  HENT:   Negative for hearing loss and voice change.   Eyes:  Negative for eye problems.  Respiratory:  Negative for chest tightness and cough.   Cardiovascular:  Negative for chest pain.  Gastrointestinal:  Positive for diarrhea. Negative for abdominal distention, abdominal pain and blood in stool.  Endocrine: Negative for hot flashes.  Genitourinary:  Negative for difficulty urinating and frequency.   Musculoskeletal:  Negative for arthralgias and back pain.       Lower back/sacrum pain and numbness  Skin:  Negative for itching and rash.  Neurological:  Negative for extremity weakness.  Hematological:  Negative for adenopathy.  Psychiatric/Behavioral:  Negative for confusion.    MEDICAL HISTORY:  Past Medical History:  Diagnosis Date   Breast cancer ([place])    COPD (chronic obstructive pulmonary disease) ([place])    Family history of adverse reaction to anesthesia    brother has problem with coming out of anesthesia   High cholesterol    Hypertension    Personal history of radiation therapy    Pre-diabetes     SURGICAL HISTORY: Past Surgical History:  Procedure Laterality Date   BREAST LUMPECTOMY Left    2010   COLONOSCOPY     EYE SURGERY Left    Cataract surgery   PORTACATH PLACEMENT N/A 08/01/2021   Procedure: INSERTION PORT-A-CATH;  Surgeon: Herbert Pun, MD;  Location: [facility] ORS;  Service: General;  Laterality: N/A;   thorocentesis Right 07/10/2021   and another on 07/25/21    SOCIAL HISTORY: Social History   Socioeconomic History    Marital status: Married    Spouse name: Not on file   Number of children: Not on file   Years of education: Not on file   Highest education level: Not on file  Occupational History   Not on file  Tobacco Use   Smoking status: Former    Packs/day: 1.00    Years: 25.00    Pack years: 25.00    Types: Cigarettes    Quit date: 3    Years since quitting: 33.1   Smokeless tobacco: Never  Vaping Use   Vaping Use: Never used  Substance and Sexual Activity   Alcohol use: Not Currently   Drug use: Never   Sexual activity: Not on file  Other Topics Concern   Not on file  Social History Narrative   Not on file   Social Determinants of Health   Financial Resource Strain: Not on file  Food Insecurity: Not on file  Transportation Needs: Not on file  Physical Activity: Not on file  Stress: Not on file  Social Connections: Not on file  Intimate Partner Violence: Not on file    FAMILY HISTORY: Family History  Problem Relation Age of Onset   Cancer Mother        gynecological   Lung cancer Mother    Diabetes Father    Heart disease Father    Parkinson's disease Father    Brain cancer Brother    Bladder Cancer Brother    Pulmonary disease Brother    Rheumatic fever Brother    Breast cancer Neg Hx  ALLERGIES:  is allergic to green tea (camellia sinensis), melaleuca viridiflora, and lisinopril.  MEDICATIONS:  Current Outpatient Medications  Medication Sig Dispense Refill   abemaciclib (VERZENIO) 100 MG tablet Take 1 tablet (100 mg total) by mouth 2 (two) times daily. Swallow tablets whole. Do not chew, crush, or split tablets before swallowing. 56 tablet 0   acetaminophen (TYLENOL) 650 MG CR tablet Take 650 mg by mouth every 8 (eight) hours as needed for pain.     aspirin 81 MG EC tablet Take by mouth.     atorvastatin (LIPITOR) 40 MG tablet Take 40 mg by mouth at bedtime.     Boswellia-Glucosamine-Vit D (OSTEO BI-FLEX-GLUCOS/5-LOXIN) TABS Take 1 capsule by mouth in the  morning and at bedtime.     Calcium Carb-Cholecalciferol 600-400 MG-UNIT TABS Take 1 tablet by mouth daily.     diphenoxylate-atropine (LOMOTIL) 2.5-0.025 MG tablet Take 1 tablet by mouth 4 (four) times daily as needed for diarrhea or loose stools. 30 tablet 0   Docusate Sodium (DSS) 100 MG CAPS Take 1 capsule by mouth every other day.     gabapentin (NEURONTIN) 300 MG capsule Take 2 capsules (600 mg total) by mouth 3 (three) times daily. 180 capsule 1   ibuprofen (ADVIL) 200 MG tablet Take by mouth. Take 400 mg by mouth every six (6) hours as needed for pain.     letrozole (FEMARA) 2.5 MG tablet Take 1 tablet (2.5 mg total) by mouth daily. 90 tablet 1   lidocaine-prilocaine (EMLA) cream Apply 1 application topically as needed. 30 g 5   Loperamide HCl (IMODIUM PO) Take by mouth.     LORazepam (ATIVAN) 0.5 MG tablet Take 1 tablet (0.5 mg total) by mouth at bedtime as needed for anxiety. 30 tablet 0   magnesium chloride (SLOW-MAG) 64 MG TBEC SR tablet Take 1 tablet by mouth at bedtime.     metoprolol-hydrochlorothiazide (LOPRESSOR HCT) 50-25 MG tablet Take 1 tablet by mouth daily. Take 1 tablet by mouth once daily     potassium chloride SA (KLOR-CON M) 20 MEQ tablet Take 1 tablet by mouth twice daily 60 tablet 0   sucralfate (CARAFATE) 1 GM/10ML suspension Take 10 mLs (1 g total) by mouth 3 (three) times daily. 420 mL 2   Turmeric (QC TUMERIC COMPLEX PO) Take 1 capsule by mouth at bedtime.     albuterol (VENTOLIN HFA) 108 (90 Base) MCG/ACT inhaler Inhale 2 puffs into the lungs every 6 (six) hours as needed. (Patient not taking: Reported on 11/04/2021)     ondansetron (ZOFRAN) 8 MG tablet Take 1 tablet (8 mg total) by mouth 2 (two) times daily as needed (Nausea or vomiting). (Patient not taking: Reported on 11/04/2021) 30 tablet 1   oxyCODONE (OXY IR/ROXICODONE) 5 MG immediate release tablet Take 1 tablet (5 mg total) by mouth every 12 (twelve) hours as needed for severe pain or moderate pain. (Patient not  taking: Reported on 09/16/2021) 60 tablet 0   prochlorperazine (COMPAZINE) 10 MG tablet Take 1 tablet (10 mg total) by mouth every 6 (six) hours as needed (Nausea or vomiting). (Patient not taking: Reported on 11/04/2021) 30 tablet 1   No current facility-administered medications for this visit.     PHYSICAL EXAMINATION: ECOG PERFORMANCE STATUS: 1 - Symptomatic but completely ambulatory Vitals:   11/04/21 1012  BP: 100/64  Pulse: 64  Resp: 18  Temp: 97.9 F (36.6 C)  SpO2: 100%   Filed Weights   11/04/21 1012  Weight: 170 lb 3.2  oz (77.2 kg)    Physical Exam Constitutional:      General: She is not in acute distress. HENT:     Head: Normocephalic and atraumatic.  Eyes:     General: No scleral icterus. Cardiovascular:     Rate and Rhythm: Normal rate and regular rhythm.     Heart sounds: Normal heart sounds.  Pulmonary:     Effort: Pulmonary effort is normal. No respiratory distress.     Breath sounds: No wheezing.     Comments: Decreased breath sound bibasilar Abdominal:     General: Bowel sounds are normal. There is no distension.     Palpations: Abdomen is soft.  Musculoskeletal:        General: No deformity. Normal range of motion.     Cervical back: Normal range of motion and neck supple.     Comments: Left lower extremity edema 1+, no tenderness.  Skin:    General: Skin is warm and dry.     Findings: No erythema or rash.  Neurological:     Mental Status: She is alert and oriented to person, place, and time. Mental status is at baseline.     Cranial Nerves: No cranial nerve deficit.     Coordination: Coordination normal.  Psychiatric:        Mood and Affect: Mood normal.    LABORATORY DATA:  I have reviewed the data as listed Lab Results  Component Value Date   WBC 6.0 11/04/2021   HGB 13.5 11/04/2021   HCT 42.1 11/04/2021   MCV 93.3 11/04/2021   PLT 294 11/04/2021   Recent Labs    10/07/21 0928 10/21/21 1101 11/04/21 0937  NA 135 138 137  K  3.7 4.0 4.4  CL 105 101 98  CO2 23 27 29   GLUCOSE 103* 100* 82  BUN 21 24* 21  CREATININE 0.81 0.81 1.04*  CALCIUM 8.6* 9.4 9.4  GFRNONAA >60 >60 57*  PROT 6.8 6.8 6.5  ALBUMIN 3.8 4.1 3.9  AST 29 29 34  ALT 25 28 43  ALKPHOS 58 62 61  BILITOT 1.2 1.0 1.1    Iron/TIBC/Ferritin/ %Sat No results found for: IRON, TIBC, FERRITIN, IRONPCTSAT    RADIOGRAPHIC STUDIES: I have personally reviewed the radiological images as listed and agreed with the findings in the report. No results found.    ASSESSMENT & PLAN:  1. Metastasis to bone ([place])   2. Metastatic breast cancer ([place])   3. Encounter for antineoplastic chemotherapy   4. Port-A-Cath in place   5. Chemotherapy induced diarrhea    # Metastatic Breast cancer with extensive thoracic and bone involvement, in visceral crisis, ER+, PR+ HER2 neg Labs reviewed and discussed with patient.  No cytopenia. Continue abemaciclib 100 mg twice daily and letrozole.  Overall she tolerates well. Check checks x-ray Repeat CT chest abdomen pelvis with contrast end of February 2023 she would like to have her blood type checked.  #Chemotherapy-induced diarrhea, continue Imodium and/or Lomotil as instructed as needed. #Elevated creatinine is secondary to abemaciclib use.  Encourage oral hydration.  #Grade 2 neuropathy, continue gabapentin, she may increase further to 600 mg 3 times daily.  #Extensive bone metastasis.  Status post palliative radiation to lumbar/sacrum Status post radiation to thoracic spine and cervical spine. Status post Zometa 09/02/2021.   Plan Zometa at the next visit.  #Hypocalcemia secondary to Zometa use.  Continue calcium supplementation.  #Anxiety and insomnia, continue low-dose Ativan 0.5 mg QHS PRN #Port-A-Cath in place, recommend port  flush every 8 weeks.  All questions were answered. The patient knows to call the clinic with any problems questions or concerns.  cc Gayland Curry, MD    Follow up  CBC  in 2 weeks Follow-up in 4 weeks, lab MD  treatments.   [name], MD, PhD  11/04/2021

## 2021-11-04 NOTE — Progress Notes (Signed)
° °Oral Chemotherapy Clinic °[facility]  °[phone] [phone] ° °Patient Care Team: °Aldridge, Barbara, MD as PCP - General (Family Medicine) °Chrystal, Glenn, MD as Consulting Physician (Radiation Oncology)  ° °Name of the patient: Amber Blevins  °[id number]  °09/13/1951  ° °Date of visit: 11/04/21 ° °HPI: Patient is a 70 y.o. female with metastatic breast cancer, ER/PR positive, HER2 negative.  Patient was initially treated with IV chemotherapy due to viseral crisis (last dose 09/16/21), she has since improved and was switched to oral chemotherapy. Currently treated with letrozole and Verzenio (abemaciclib). She started the letrozole on 09/18/21 and the abemaciclib on 09/30/21. ° °Reason for Consult: Oral chemotherapy follow-up for abemaciclib therapy. ° ° °PAST MEDICAL HISTORY: °Past Medical History:  °Diagnosis Date  ° Breast cancer ([facility])   ° COPD (chronic obstructive pulmonary disease) ([facility])   ° Family history of adverse reaction to anesthesia   ° brother has problem with coming out of anesthesia  ° High cholesterol   ° Hypertension   ° Personal history of radiation therapy   ° Pre-diabetes   ° ° °HEMATOLOGY/ONCOLOGY HISTORY:  °Oncology History  °Metastasis to bone of unknown primary ([facility])  °07/12/2021 Initial Diagnosis  ° Metastasis to bone of unknown primary ([facility]) °  °07/21/2021 -  Chemotherapy  ° Patient is on Treatment Plan : BREAST Paclitaxel D1,8,15 q28d  °   °Metastatic breast cancer ([facility])  °07/17/2021 Initial Diagnosis  ° Metastatic breast cancer ([facility]) °  °07/21/2021 -  Chemotherapy  ° Patient is on Treatment Plan : BREAST Paclitaxel D1,8,15 q28d  °   ° ° °ALLERGIES:  is allergic to green tea (camellia sinensis), melaleuca viridiflora, and lisinopril. ° °MEDICATIONS:  °Current Outpatient Medications  °Medication Sig Dispense Refill  ° acetaminophen (TYLENOL) 650 MG CR tablet Take 650 mg by mouth every 8 (eight) hours as needed for pain.    ° albuterol (VENTOLIN  HFA) 108 (90 Base) MCG/ACT inhaler Inhale 2 puffs into the lungs every 6 (six) hours as needed.    ° aspirin 81 MG EC tablet Take by mouth.    ° atorvastatin (LIPITOR) 40 MG tablet Take 40 mg by mouth at bedtime.    ° Boswellia-Glucosamine-Vit D (OSTEO BI-FLEX-GLUCOS/5-LOXIN) TABS Take 1 capsule by mouth in the morning and at bedtime.    ° Calcium Carb-Cholecalciferol 600-400 MG-UNIT TABS Take 1 tablet by mouth daily.    ° dexamethasone (DECADRON) 2 MG tablet Take 2 tablets (4 mg total) by mouth 2 (two) times daily. Twice daily for 4 days, then once daily for 4 days. 12 tablet 0  ° diphenoxylate-atropine (LOMOTIL) 2.5-0.025 MG tablet Take 1 tablet by mouth 4 (four) times daily as needed for diarrhea or loose stools. 30 tablet 0  ° Docusate Sodium (DSS) 100 MG CAPS Take 1 capsule by mouth every other day.    ° Fluticasone-Umeclidin-Vilant (TRELEGY ELLIPTA) 100-62.5-25 MCG/INH AEPB Inhale into the lungs. Inhale 1 inhalation into the lungs once daily    ° gabapentin (NEURONTIN) 300 MG capsule Take 2 capsules (600 mg total) by mouth 3 (three) times daily. 180 capsule 1  ° ibuprofen (ADVIL) 200 MG tablet Take by mouth. Take 400 mg by mouth every six (6) hours as needed for pain.    ° letrozole (FEMARA) 2.5 MG tablet Take 1 tablet (2.5 mg total) by mouth daily. 90 tablet 1  ° lidocaine-prilocaine (EMLA) cream Apply 1 application topically as needed. 30 g 5  ° Loperamide HCl (IMODIUM PO) Take by mouth.    °   LORazepam (ATIVAN) 0.5 MG tablet Take 1 tablet (0.5 mg total) by mouth at bedtime as needed for anxiety. 30 tablet 0  ° magnesium chloride (SLOW-MAG) 64 MG TBEC SR tablet Take 1 tablet by mouth at bedtime.    ° metoprolol-hydrochlorothiazide (LOPRESSOR HCT) 50-25 MG tablet Take 1 tablet by mouth daily. Take 1 tablet by mouth once daily    ° ondansetron (ZOFRAN) 8 MG tablet Take 1 tablet (8 mg total) by mouth 2 (two) times daily as needed (Nausea or vomiting). 30 tablet 1  ° oxyCODONE (OXY IR/ROXICODONE) 5 MG immediate  release tablet Take 1 tablet (5 mg total) by mouth every 12 (twelve) hours as needed for severe pain or moderate pain. (Patient not taking: Reported on 09/16/2021) 60 tablet 0  ° potassium chloride SA (KLOR-CON M) 20 MEQ tablet Take 1 tablet by mouth twice daily 60 tablet 0  ° prochlorperazine (COMPAZINE) 10 MG tablet Take 1 tablet (10 mg total) by mouth every 6 (six) hours as needed (Nausea or vomiting). 30 tablet 1  ° sucralfate (CARAFATE) 1 GM/10ML suspension Take 10 mLs (1 g total) by mouth 3 (three) times daily. 420 mL 2  ° Turmeric (QC TUMERIC COMPLEX PO) Take 1 capsule by mouth at bedtime.    ° abemaciclib (VERZENIO) 100 MG tablet Take 1 tablet (100 mg total) by mouth 2 (two) times daily. Swallow tablets whole. Do not chew, crush, or split tablets before swallowing. 56 tablet 0  ° °No current facility-administered medications for this visit.  ° ° °VITAL SIGNS: °There were no vitals taken for this visit. °There were no vitals filed for this visit.  °Estimated body mass index is 29.59 kg/m² as calculated from the following: °  Height as of 08/01/21: 5' 4" (1.626 m). °  Weight as of 10/07/21: 78.2 kg (172 lb 6.4 oz). ° °LABS: °CBC: °   °Component Value Date/Time  ° WBC 4.9 10/21/2021 1101  ° HGB 12.5 10/21/2021 1101  ° HCT 39.5 10/21/2021 1101  ° PLT 192 10/21/2021 1101  ° MCV 94.0 10/21/2021 1101  ° NEUTROABS 3.9 10/21/2021 1101  ° LYMPHSABS 0.4 (L) 10/21/2021 1101  ° MONOABS 0.3 10/21/2021 1101  ° EOSABS 0.2 10/21/2021 1101  ° BASOSABS 0.0 10/21/2021 1101  ° °Comprehensive Metabolic Panel: °   °Component Value Date/Time  ° NA 138 10/21/2021 1101  ° K 4.0 10/21/2021 1101  ° CL 101 10/21/2021 1101  ° CO2 27 10/21/2021 1101  ° BUN 24 (H) 10/21/2021 1101  ° CREATININE 0.81 10/21/2021 1101  ° GLUCOSE 100 (H) 10/21/2021 1101  ° CALCIUM 9.4 10/21/2021 1101  ° AST 29 10/21/2021 1101  ° ALT 28 10/21/2021 1101  ° ALKPHOS 62 10/21/2021 1101  ° BILITOT 1.0 10/21/2021 1101  ° PROT 6.8 10/21/2021 1101  ° ALBUMIN 4.1  10/21/2021 1101  ° ° ° °Present during today's visit: patient and her husband ° °Assessment and Plan: °CBC/CMP reviewed, continue abemaciclib 100mg bid °  °Oral Chemotherapy Side Effect/Intolerance:  °Diarrhea: Patient continues to manage her diarrhea appropriately, she is needing to her antidiarrheal about every 7-8 days and the diarrhea resolves about about 2 doses °Nausea: only one incidence, resolved with antiemetic °No reported fatigue °Fatigue: patient reports feeling better since stopping the Trelegy ° °Oral Chemotherapy Adherence: no reported missed doses °No patient barriers to medication adherence identified.  ° °Medication Access Issues: no issues, patient fills at [facility] ° °Patient expressed understanding and was in agreement with this plan. She also understands that   She can call clinic at any time with any questions, concerns, or complaints.   Follow-up plan: RTC in 4 weeks  Thank you for allowing me to participate in the care of this very pleasant patient.   Time Total: 15 mins  Visit consisted of counseling and education on dealing with issues of symptom management in the setting of serious and potentially life-threatening illness.Greater than 50%  of this time was spent counseling and coordinating care related to the above assessment and plan.  Signed by: Darl Pikes, PharmD, BCPS, Salley Slaughter, CPP Hematology/Oncology Clinical Pharmacist Practitioner [place] [phone]  11/04/2021 9:31 AM

## 2021-11-04 NOTE — Progress Notes (Signed)
Nutrition Follow-up:  Patient with metastatic breast cancer with tumor in lumbar vertebrae, pulmonary nodules, hepatic and bilateral adrenal gland, and bone.  Patient started on abemaciclib (1/3) and letrozole (12/22).   Met with patient following MD visit.  Patient reports that her appetite is much better and throat pain is better.  Has been eating a bigger breakfast (toast, cereal, eggs, tuna) and foods like meatloaf, spaghetti, pork chop, vegetables, shakes.  Says that she is averaging about 70-80 g of protein each day, sometimes more.  Says that she is walking more and feeling better.      Medications: reviewed  Labs: reviewed  Anthropometrics:   Weight 170 lb 3.2 oz  175 lb 11 oz on 12/13 179 lb on 11/29  180 lb UBW  NUTRITION DIAGNOSIS: Inadequate oral intake continues   INTERVENTION:  Continue good sources of protein Continue exercise  Continue oral nutrition supplements    MONITORING, EVALUATION, GOAL: weight trends, intake   NEXT VISIT: Tuesday, March 7   Amber Blevins B. Zenia Resides, [place], [place] Registered Dietitian [phone] (mobile)

## 2021-11-05 ENCOUNTER — Encounter: Payer: Self-pay | Admitting: Oncology

## 2021-11-05 ENCOUNTER — Other Ambulatory Visit: Payer: Self-pay

## 2021-11-05 ENCOUNTER — Other Ambulatory Visit (HOSPITAL_COMMUNITY): Payer: Self-pay

## 2021-11-05 DIAGNOSIS — C50919 Malignant neoplasm of unspecified site of unspecified female breast: Secondary | ICD-10-CM

## 2021-11-05 LAB — CANCER ANTIGEN 15-3: CA 15-3: 97.1 U/mL — ABNORMAL HIGH (ref 0.0–25.0)

## 2021-11-05 LAB — CANCER ANTIGEN 27.29: CA 27.29: 105.4 U/mL — ABNORMAL HIGH (ref 0.0–38.6)

## 2021-11-05 MED ORDER — ABEMACICLIB 100 MG PO TABS
ORAL_TABLET | ORAL | 0 refills | Status: DC
  Filled 2021-11-05: qty 56, fill #0
  Filled 2021-11-14: qty 56, 28d supply, fill #0

## 2021-11-07 ENCOUNTER — Other Ambulatory Visit: Payer: Self-pay

## 2021-11-07 ENCOUNTER — Telehealth: Payer: Self-pay | Admitting: *Deleted

## 2021-11-07 DIAGNOSIS — J9601 Acute respiratory failure with hypoxia: Secondary | ICD-10-CM

## 2021-11-07 NOTE — Telephone Encounter (Signed)
Finally was able to reach [facility] with [employer] and entered order for oxygen this charge. They should contact her with pickup details.

## 2021-11-07 NOTE — Telephone Encounter (Signed)
Yes, I am aware and I reached out to [employer] to see what the process is or what kind of form is needed but I have not heard back. I will see if I can reach them again.

## 2021-11-07 NOTE — Telephone Encounter (Signed)
Patient called and said that she sent a message via My Chart to have an order sent to stop her O2 so that they will pick up equipment and stop charging her for it. She asks that she be notified once the order is sent  Lauree Chandler to [place] [facility] (supporting [name], MD)      4:56 PM I need you to fax a release form for [employer] to pick up my oxygen.  The fax number is [phone]. Please have this done as soon as possible and advise me when it has been done, so I can coordinate a pick up thank you. Jeanett Schlein

## 2021-11-07 NOTE — Telephone Encounter (Signed)
Patient notified that [employer] should be contacting her to pick up equipment

## 2021-11-11 ENCOUNTER — Inpatient Hospital Stay: Payer: Medicare PPO

## 2021-11-14 ENCOUNTER — Inpatient Hospital Stay (HOSPITAL_BASED_OUTPATIENT_CLINIC_OR_DEPARTMENT_OTHER): Payer: Medicare PPO | Admitting: Hospice and Palliative Medicine

## 2021-11-14 ENCOUNTER — Telehealth: Payer: Self-pay | Admitting: *Deleted

## 2021-11-14 ENCOUNTER — Inpatient Hospital Stay: Payer: Medicare PPO

## 2021-11-14 ENCOUNTER — Other Ambulatory Visit (HOSPITAL_COMMUNITY): Payer: Self-pay

## 2021-11-14 ENCOUNTER — Other Ambulatory Visit: Payer: Self-pay

## 2021-11-14 VITALS — BP 139/69 | HR 52 | Temp 99.6°F | Resp 16

## 2021-11-14 DIAGNOSIS — C50919 Malignant neoplasm of unspecified site of unspecified female breast: Secondary | ICD-10-CM

## 2021-11-14 DIAGNOSIS — C7951 Secondary malignant neoplasm of bone: Secondary | ICD-10-CM | POA: Diagnosis not present

## 2021-11-14 DIAGNOSIS — Z95828 Presence of other vascular implants and grafts: Secondary | ICD-10-CM

## 2021-11-14 LAB — CBC WITH DIFFERENTIAL/PLATELET
Abs Immature Granulocytes: 0.02 10*3/uL (ref 0.00–0.07)
Basophils Absolute: 0 10*3/uL (ref 0.0–0.1)
Basophils Relative: 1 %
Eosinophils Absolute: 0.1 10*3/uL (ref 0.0–0.5)
Eosinophils Relative: 1 %
HCT: 37.1 % (ref 36.0–46.0)
Hemoglobin: 12.1 g/dL (ref 12.0–15.0)
Immature Granulocytes: 0 %
Lymphocytes Relative: 10 %
Lymphs Abs: 0.5 10*3/uL — ABNORMAL LOW (ref 0.7–4.0)
MCH: 30.3 pg (ref 26.0–34.0)
MCHC: 32.6 g/dL (ref 30.0–36.0)
MCV: 92.8 fL (ref 80.0–100.0)
Monocytes Absolute: 0.4 10*3/uL (ref 0.1–1.0)
Monocytes Relative: 8 %
Neutro Abs: 3.8 10*3/uL (ref 1.7–7.7)
Neutrophils Relative %: 80 %
Platelets: 138 10*3/uL — ABNORMAL LOW (ref 150–400)
RBC: 4 MIL/uL (ref 3.87–5.11)
RDW: 18.6 % — ABNORMAL HIGH (ref 11.5–15.5)
WBC: 4.7 10*3/uL (ref 4.0–10.5)
nRBC: 0 % (ref 0.0–0.2)

## 2021-11-14 LAB — COMPREHENSIVE METABOLIC PANEL
ALT: 23 U/L (ref 0–44)
AST: 23 U/L (ref 15–41)
Albumin: 3.5 g/dL (ref 3.5–5.0)
Alkaline Phosphatase: 59 U/L (ref 38–126)
Anion gap: 11 (ref 5–15)
BUN: 12 mg/dL (ref 8–23)
CO2: 26 mmol/L (ref 22–32)
Calcium: 9.1 mg/dL (ref 8.9–10.3)
Chloride: 99 mmol/L (ref 98–111)
Creatinine, Ser: 0.74 mg/dL (ref 0.44–1.00)
GFR, Estimated: >60 mL/min (ref >60)
Glucose, Bld: 100 mg/dL — ABNORMAL HIGH (ref 70–99)
Potassium: 3.5 mmol/L (ref 3.5–5.1)
Sodium: 136 mmol/L (ref 135–145)
Total Bilirubin: 1.2 mg/dL (ref 0.3–1.2)
Total Protein: 6.7 g/dL (ref 6.5–8.1)

## 2021-11-14 LAB — ABO/RH: ABO/RH(D): A POS

## 2021-11-14 MED ORDER — HEPARIN SOD (PORK) LOCK FLUSH 100 UNIT/ML IV SOLN
500.0000 [IU] | Freq: Once | INTRAVENOUS | Status: AC
  Administered 2021-11-14: 500 [IU] via INTRAVENOUS
  Filled 2021-11-14: qty 5

## 2021-11-14 MED ORDER — SODIUM CHLORIDE 0.9 % IV SOLN
INTRAVENOUS | Status: DC
  Filled 2021-11-14 (×2): qty 250

## 2021-11-14 MED ORDER — SODIUM CHLORIDE 0.9% FLUSH
10.0000 mL | Freq: Once | INTRAVENOUS | Status: AC
  Administered 2021-11-14: 10 mL via INTRAVENOUS
  Filled 2021-11-14: qty 10

## 2021-11-14 MED ORDER — LORAZEPAM 0.5 MG PO TABS: 0.5000 mg | ORAL_TABLET | Freq: Two times a day (BID) | ORAL | 0 refills | Status: DC | PRN

## 2021-11-14 NOTE — Telephone Encounter (Signed)
Called pt and offered appointment in [facility] today. Pt in agreement. Appointment scheduled.

## 2021-11-14 NOTE — Progress Notes (Signed)
Symptom Management [place] at [facility] Telephone:([phone]) [phone] [phone]  Patient Care Team: Gayland Curry, MD as PCP - General (Family Medicine) Noreene Filbert, MD as Consulting Physician (Radiation Oncology)   Name of the patient: Amber Blevins  [id number]  1950-12-02   Date of visit: 11/14/21  Reason for Consult: Amber Blevins is a 71 y.o. 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   Patient last saw Dr. Tasia Catchings on 11/04/2021 with plan to repeat imaging at end of February.  Patient presents to [facility] today for evaluation of "not feeling right".  She says that she woke this morning and just felt a bit off and progressed to feeling shaky, she became flushed, she felt like her heart was racing, she felt anxious.  Husband feels like patient has been having anxiety spells.  At present, patient reports complete resolution of symptoms.  She feels "100%" better.  She does endorse some chronic left pedal edema but denies any changes in severity.  She has previously had negative Dopplers for evaluation.  Denies any neurologic complaints. Denies recent fevers or illnesses. Denies any easy bleeding or bruising. Reports good appetite and denies weight loss. Denies chest pain. Denies any nausea, vomiting, constipation, or diarrhea. Denies urinary complaints. Patient offers no further specific complaints today.  PAST MEDICAL HISTORY: Past Medical History:  Diagnosis Date   Breast cancer ([place])    COPD (chronic obstructive pulmonary disease) ([place])    Family history of adverse reaction to anesthesia    brother has problem with coming out of anesthesia   High cholesterol    Hypertension    Personal history of radiation therapy    Pre-diabetes      PAST SURGICAL HISTORY:  Past Surgical History:  Procedure Laterality Date   BREAST LUMPECTOMY Left    2010   COLONOSCOPY     EYE SURGERY Left    Cataract surgery   PORTACATH PLACEMENT N/A 08/01/2021   Procedure: INSERTION PORT-A-CATH;  Surgeon: Herbert Pun, MD;  Location: [facility] ORS;  Service: General;  Laterality: N/A;   thorocentesis Right 07/10/2021   and another on 07/25/21    HEMATOLOGY/ONCOLOGY HISTORY:  Oncology History  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  MEDICATIONS:  Current Outpatient Medications  Medication Sig Dispense Refill   abemaciclib (VERZENIO) 100 MG tablet Take 1 tablet (100 mg total) by mouth 2 (two) times daily. Swallow tablets whole. Do not chew, crush, or split tablets before swallowing. 56 tablet 0   acetaminophen (TYLENOL) 650 MG CR tablet Take 650 mg by mouth every 8 (eight) hours as needed for pain.     albuterol (VENTOLIN HFA) 108 (90 Base) MCG/ACT inhaler Inhale 2 puffs into the lungs every 6 (six) hours as needed. (Patient not taking: Reported on 11/04/2021)     aspirin 81 MG EC tablet Take  by mouth.     atorvastatin (LIPITOR) 40 MG tablet Take 40 mg by mouth at bedtime.     Boswellia-Glucosamine-Vit D (OSTEO BI-FLEX-GLUCOS/5-LOXIN) TABS Take 1 capsule by mouth in the morning and at bedtime.     Calcium Carb-Cholecalciferol 600-400 MG-UNIT TABS Take 1 tablet by mouth daily.     diphenoxylate-atropine (LOMOTIL) 2.5-0.025 MG tablet Take 1 tablet by mouth 4 (four) times  daily as needed for diarrhea or loose stools. 30 tablet 0   Docusate Sodium (DSS) 100 MG CAPS Take 1 capsule by mouth every other day.     gabapentin (NEURONTIN) 300 MG capsule Take 2 capsules (600 mg total) by mouth 3 (three) times daily. 180 capsule 1   ibuprofen (ADVIL) 200 MG tablet Take by mouth. Take 400 mg by mouth every six (6) hours as needed for pain.     letrozole (FEMARA) 2.5 MG tablet Take 1 tablet (2.5 mg total) by mouth daily. 90 tablet 1   lidocaine-prilocaine (EMLA) cream Apply 1 application topically as needed. 30 g 5   Loperamide HCl (IMODIUM PO) Take by mouth.     LORazepam (ATIVAN) 0.5 MG tablet Take 1 tablet (0.5 mg total) by mouth at bedtime as needed for anxiety. 30 tablet 0   magnesium chloride (SLOW-MAG) 64 MG TBEC SR tablet Take 1 tablet by mouth at bedtime.     metoprolol-hydrochlorothiazide (LOPRESSOR HCT) 50-25 MG tablet Take 1 tablet by mouth daily. Take 1 tablet by mouth once daily     ondansetron (ZOFRAN) 8 MG tablet Take 1 tablet (8 mg total) by mouth 2 (two) times daily as needed (Nausea or vomiting). (Patient not taking: Reported on 11/04/2021) 30 tablet 1   oxyCODONE (OXY IR/ROXICODONE) 5 MG immediate release tablet Take 1 tablet (5 mg total) by mouth every 12 (twelve) hours as needed for severe pain or moderate pain. (Patient not taking: Reported on 09/16/2021) 60 tablet 0   potassium chloride SA (KLOR-CON M) 20 MEQ tablet Take 1 tablet by mouth twice daily 60 tablet 0   prochlorperazine (COMPAZINE) 10 MG tablet Take 1 tablet (10 mg total) by mouth every 6 (six) hours as needed (Nausea or vomiting). (Patient not taking: Reported on 11/04/2021) 30 tablet 1   sucralfate (CARAFATE) 1 GM/10ML suspension Take 10 mLs (1 g total) by mouth 3 (three) times daily. 420 mL 2   Turmeric (QC TUMERIC COMPLEX PO) Take 1 capsule by mouth at bedtime.     No current facility-administered medications for this visit.   Facility-Administered Medications Ordered in Other Visits   Medication Dose Route Frequency Provider Last Rate Last Admin   heparin lock flush 100 unit/mL  500 Units Intravenous Once Ohana Birdwell, Vonna Kotyk R, NP       sodium chloride flush (NS) 0.9 % injection 10 mL  10 mL Intravenous Once Narda Fundora, Kirt Boys, NP        VITAL SIGNS: BP 139/69    Pulse (!) 52    Temp 99.6 F (37.6 C) (Tympanic)    Resp 16    SpO2 100%  There were no vitals filed for this visit.  Estimated body mass index is 29.21 kg/m as calculated from the following:   Height as of 08/01/21: 5\' 4"  (1.626 m).   Weight as of 11/04/21: 170 lb 3.2 oz (77.2 kg).  LABS: CBC:    Component Value Date/Time   WBC 6.0 11/04/2021 0937   HGB 13.5 11/04/2021 0937   HCT 42.1 11/04/2021 0937   PLT 294 11/04/2021  0937   MCV 93.3 11/04/2021 0937   NEUTROABS 4.8 11/04/2021 0937   LYMPHSABS 0.5 (L) 11/04/2021 0937   MONOABS 0.5 11/04/2021 0937   EOSABS 0.2 11/04/2021 0937   BASOSABS 0.1 11/04/2021 0937   Comprehensive Metabolic Panel:    Component Value Date/Time   NA 137 11/04/2021 0937   K 4.4 11/04/2021 0937   CL 98 11/04/2021 0937   CO2 29 11/04/2021 0937   BUN 21 11/04/2021 0937   CREATININE 1.04 (H) 11/04/2021 0937   GLUCOSE 82 11/04/2021 0937   CALCIUM 9.4 11/04/2021 0937   AST 34 11/04/2021 0937   ALT 43 11/04/2021 0937   ALKPHOS 61 11/04/2021 0937   BILITOT 1.1 11/04/2021 0937   PROT 6.5 11/04/2021 0937   ALBUMIN 3.9 11/04/2021 0937    RADIOGRAPHIC STUDIES: No results found.  PERFORMANCE STATUS (ECOG) : 1 - Symptomatic but completely ambulatory  Review of Systems Unless otherwise noted, a complete review of systems is negative.  Physical Exam General: NAD Cardiovascular: regular rate and rhythm Pulmonary: clear anterior/posterior fields Abdomen: soft, nontender, + bowel sounds GU: no suprapubic tenderness Extremities: no edema, no joint deformities Skin: no rashes Neurological: Grossly nonfocal  Assessment and Plan- Patient is a 71 y.o. 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  Patient presents to [facility] for evaluation of "not feeling right"   Anxiety -suspect that patient's symptoms were attributed to anxiety.  Labs unremarkable.  Assessment is benign.  She reports resolution of symptoms currently.  We discussed liberalizing frequency lorazepam to twice daily as needed.  I would recommend starting an SSRI for chronic management but patient declined this.  Patient will monitor symptoms and let us know if they recur.  Case and plan discussed with Dr. Tasia Catchings.   Patient expressed understanding and was in agreement with this plan. She also understands that She can call clinic at any time with any questions, concerns, or complaints.   Thank you for allowing me to participate in the care of this very pleasant patient.   Time Total: 15 minutes  Visit consisted of counseling and education dealing with the complex and emotionally intense issues of symptom management in the setting of serious illness.Greater than 50%  of this time was spent counseling and coordinating care related to the above assessment and plan.  Signed by: Altha Harm, PhD, NP-C

## 2021-11-14 NOTE — Telephone Encounter (Signed)
Patient called reporting that she does not feel right, she has shortness of breath, transient dizziness, and feels anxious. This started this morning. She has not taken anything new, she has taken her Verzenio this morning. She is eating and drinking normally. She does sound shortness of breath talking on the phone. Please advise

## 2021-11-14 NOTE — Progress Notes (Signed)
Pt in smc this AM with reports of "not feeling right". She reports that she felt light headed and short of breath this morning, with shaking, and stated that she felt "heaviness" in her chest. Pt also reports a slightly red rash around her eyebrows and swelling in her left foot/ankle. Denies any injury to this area.

## 2021-11-18 ENCOUNTER — Inpatient Hospital Stay: Payer: Medicare PPO

## 2021-11-19 ENCOUNTER — Other Ambulatory Visit: Payer: Self-pay

## 2021-11-19 ENCOUNTER — Other Ambulatory Visit (HOSPITAL_COMMUNITY): Payer: Self-pay

## 2021-11-19 ENCOUNTER — Encounter: Payer: Self-pay | Admitting: Radiation Oncology

## 2021-11-19 ENCOUNTER — Ambulatory Visit
Admission: RE | Admit: 2021-11-19 | Discharge: 2021-11-19 | Disposition: A | Discharge status: Home / Self Care | Payer: Medicare PPO | Source: Ambulatory Visit | Attending: Radiation Oncology | Admitting: Radiation Oncology

## 2021-11-19 VITALS — BP 120/69 | HR 42 | Temp 98.3°F | Resp 20 | Wt 174.6 lb

## 2021-11-19 DIAGNOSIS — C50919 Malignant neoplasm of unspecified site of unspecified female breast: Secondary | ICD-10-CM

## 2021-11-19 NOTE — Progress Notes (Signed)
Radiation Oncology Follow up Note  Name: Amber Blevins   Date:   11/19/2021 MRN:  [id number] DOB: October 11, 1950    This 71 y.o. female presents to the clinic today for 1 month follow-up status post cervical spine radiation therapy and patient with multiple sites of palliative treatment for stage IV breast cancer.  REFERRING PROVIDER: Gayland Curry, MD  HPI: Patient is a 71 year old female now at 1 month having completed radiation therapy to her cervical spine for metastatic breast cancer.  She is previously treated to her thoracic spine as well as her lumbar spine for stage IV disease.  Seen today in routine follow-up she is doing well her dysphagia has completely cleared.  She is having no significant back pain at this time.  She is ambulating well..  She is currently onabemaciclib 100 mg twice daily and letrozole which she is tolerating well.  COMPLICATIONS OF TREATMENT: none  FOLLOW UP COMPLIANCE: keeps appointments   PHYSICAL EXAM:  BP 120/69 (BP Location: Right Arm, Patient Position: Sitting, Cuff Size: Normal)    Pulse (!) 42    Temp 98.3 F (36.8 C) (Tympanic)    Resp 20    Wt 174 lb 9.6 oz (79.2 kg)    BMI 29.97 kg/m  Motor and sensory levels are equal and symmetric in lower extremities proprioception is intact.  Deep palpation of her spine does not elicit pain.  Well-developed well-nourished patient in NAD. HEENT reveals PERLA, EOMI, discs not visualized.  Oral cavity is clear. No oral mucosal lesions are identified. Neck is clear without evidence of cervical or supraclavicular adenopathy. Lungs are clear to A&P. Cardiac examination is essentially unremarkable with regular rate and rhythm without murmur rub or thrill. Abdomen is benign with no organomegaly or masses noted. Motor sensory and DTR levels are equal and symmetric in the upper and lower extremities. Cranial nerves II through XII are grossly intact. Proprioception is intact. No peripheral adenopathy or edema is  identified. No motor or sensory levels are noted. Crude visual fields are within normal range.  RADIOLOGY RESULTS: No current films for review  PLAN: Present time patient is achieved excellent palliation from palliative radiation therapy to her spine.  And pleased with her overall progress.  I will turn follow-up care over to medical oncology.  I be happy to reevaluate the patient anytime should further consultation be indicated.  Patient knows to call with any concerns.  I would like to take this opportunity to thank you for allowing me to participate in the care of your patient.Noreene Filbert, MD

## 2021-11-21 ENCOUNTER — Other Ambulatory Visit: Payer: Self-pay | Admitting: Oncology

## 2021-11-21 MED ORDER — POTASSIUM CHLORIDE CRYS ER 20 MEQ PO TBCR: 20.0000 meq | EXTENDED_RELEASE_TABLET | Freq: Two times a day (BID) | ORAL | 0 refills | Status: DC

## 2021-11-21 NOTE — Telephone Encounter (Signed)
°  Component Ref Range & Units 7 d ago (11/14/21) 2 wk ago (11/04/21) 1 mo ago (10/21/21) 1 mo ago (10/07/21) 2 mo ago (09/16/21) 2 mo ago (09/09/21) 2 mo ago (09/02/21)  Potassium 3.5 - 5.1 mmol/L 3.5  4.4  4.0  3.7  3.3 Low   3.0 Low   3.5

## 2021-11-24 ENCOUNTER — Ambulatory Visit
Admission: RE | Admit: 2021-11-24 | Discharge: 2021-11-24 | Disposition: A | Discharge status: Home / Self Care | Payer: Medicare PPO | Source: Ambulatory Visit | Attending: Oncology | Admitting: Oncology

## 2021-11-24 DIAGNOSIS — C50919 Malignant neoplasm of unspecified site of unspecified female breast: Secondary | ICD-10-CM | POA: Diagnosis not present

## 2021-11-24 DIAGNOSIS — C7951 Secondary malignant neoplasm of bone: Secondary | ICD-10-CM | POA: Insufficient documentation

## 2021-11-24 DIAGNOSIS — C7972 Secondary malignant neoplasm of left adrenal gland: Secondary | ICD-10-CM | POA: Insufficient documentation

## 2021-11-24 IMAGING — CT CT CHEST-ABD-PELV W/ CM
3 of 5 series · 14 of 36 positions shown, 16 images · IV contrast (agent unspecified)
Comparison: [DATE]

CLINICAL DATA: History of breast cancer with radiation and
chemotherapy. Surveillance exam. * onc *

EXAM:
CT CHEST, ABDOMEN, AND PELVIS WITH CONTRAST
TECHNIQUE: Multidetector CT imaging of the chest, abdomen and pelvis was
performed following the standard protocol during bolus
administration of intravenous contrast.

[Series 2: cap with · axial · 0.88mm/px · z∈[-146,+399]mm · 9 of 137 slices shown, 11 images]
[im 14/137  mediastinal]
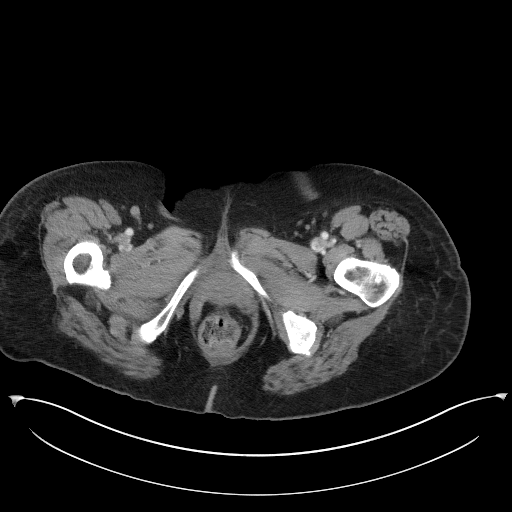
[im 14/137  bone]
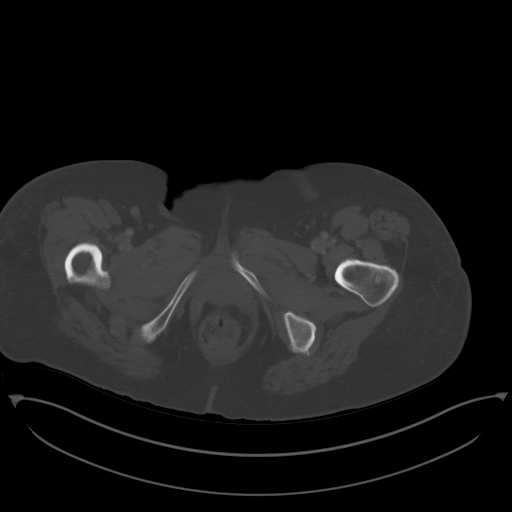
[im 28/137  mediastinal]
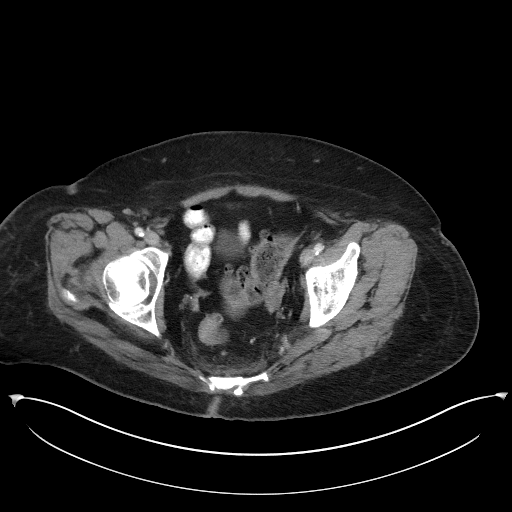
[im 41/137  mediastinal]
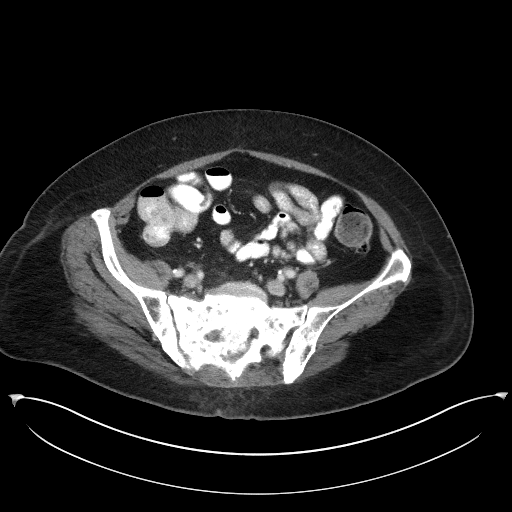
[im 55/137  mediastinal]
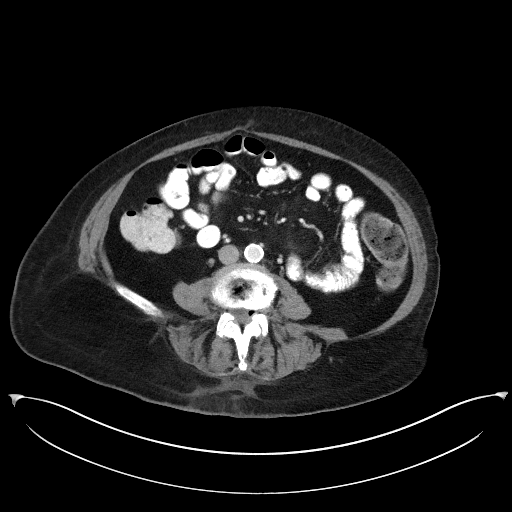
[im 69/137  mediastinal]
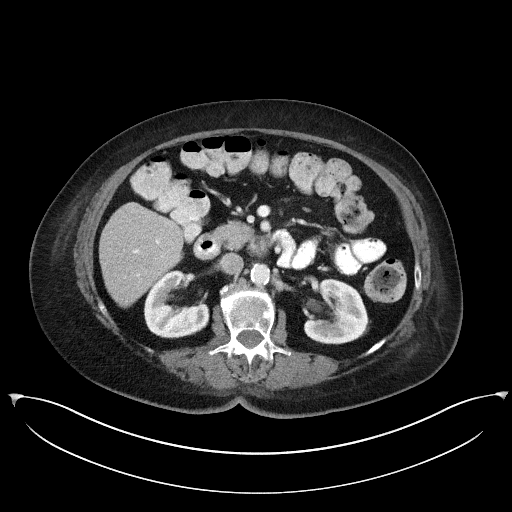
[im 82/137  mediastinal]
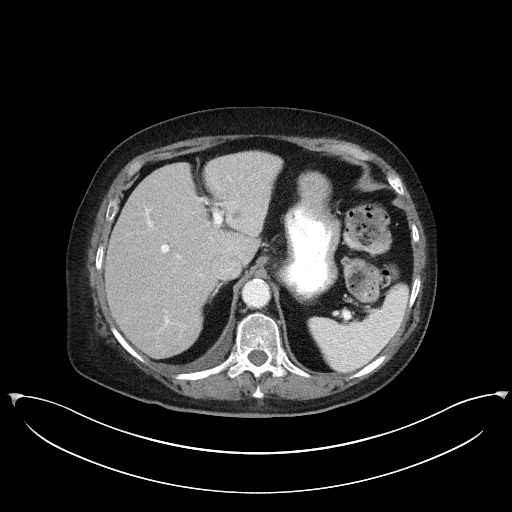
[im 96/137  mediastinal]
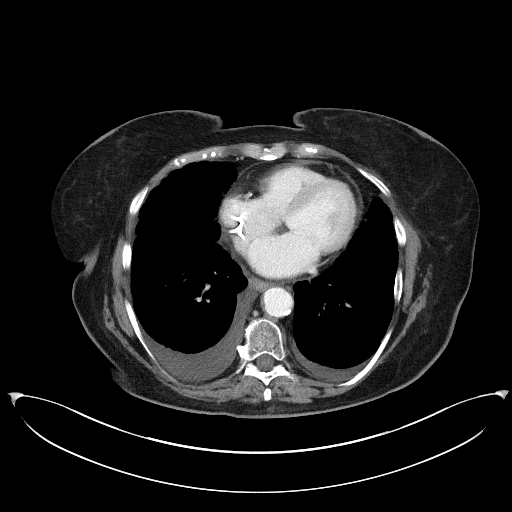
[im 109/137  mediastinal]
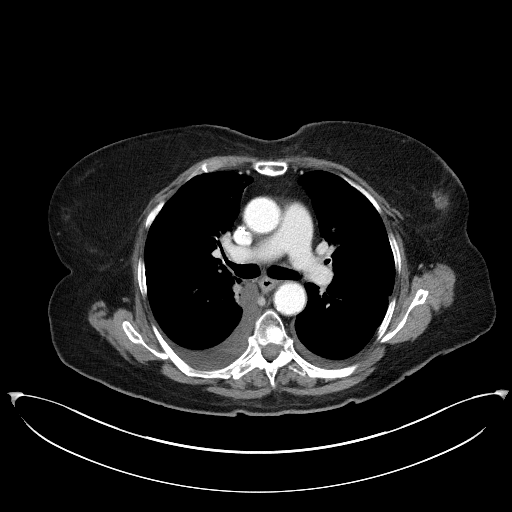
[im 123/137  mediastinal]
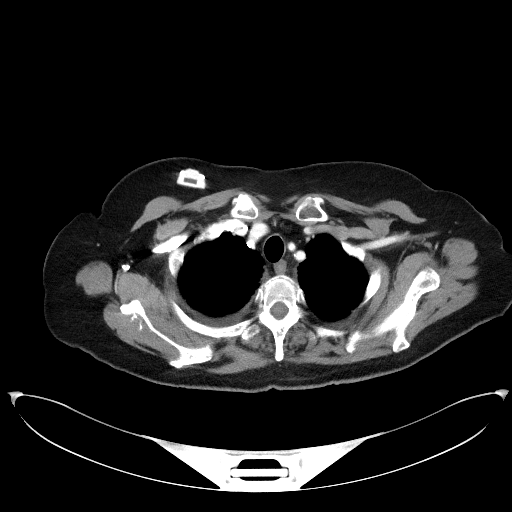
[im 123/137  bone]
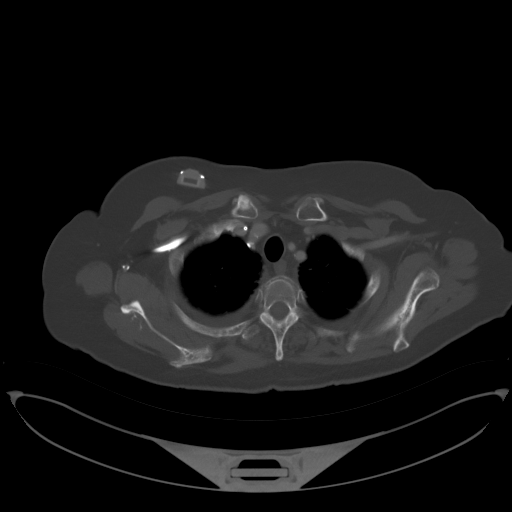

[Series 4: lung · axial · 0.88mm/px · z∈[+167,+217]mm · 2 of 164 slices shown]
[im 13/164  bone]
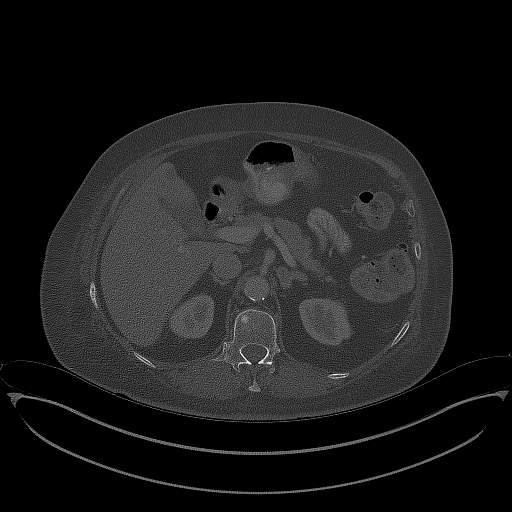
[im 38/164  bone]
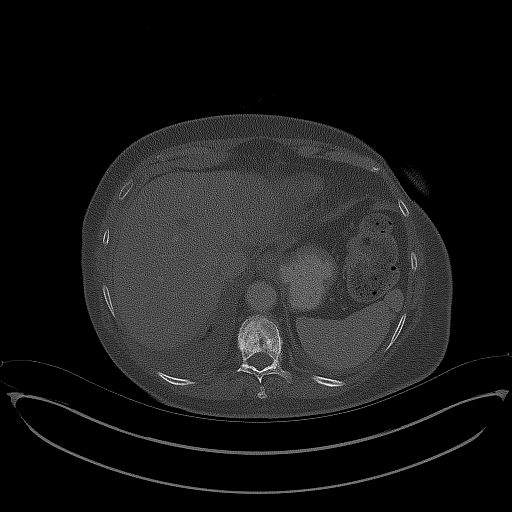

[Series 5: coronals · coronal · 0.91mm/px · 3 of 140 slices shown]
[im 28/140  mediastinal]
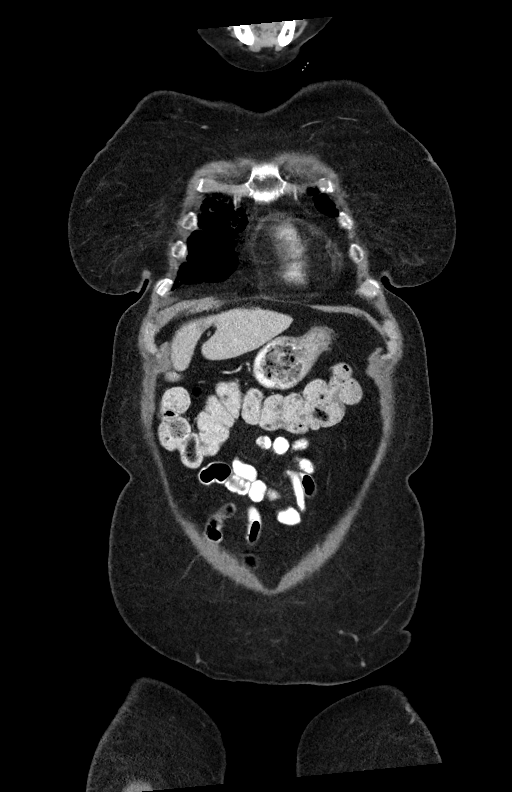
[im 56/140  mediastinal]
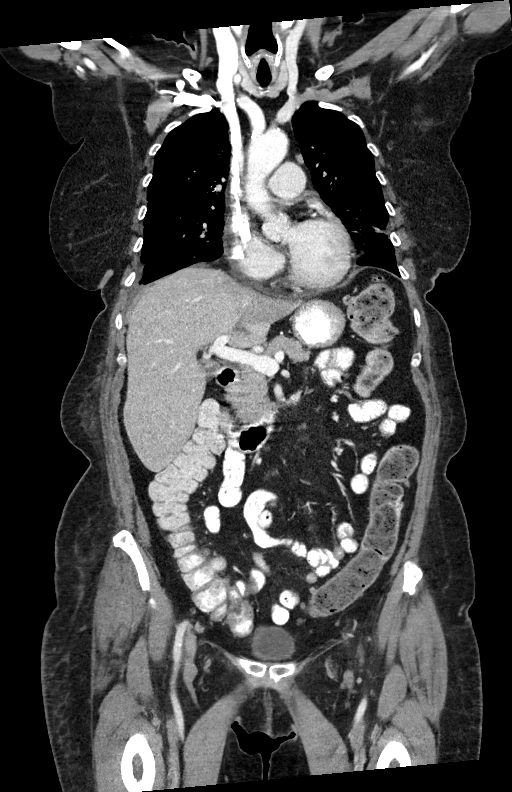
[im 84/140  mediastinal]
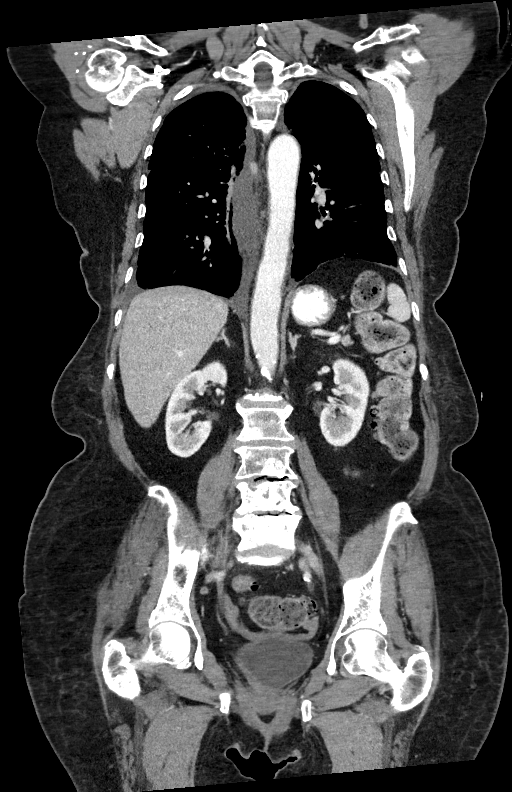

[14 of 36 positions shown; findings below may reference images not displayed]

RADIATION DOSE REDUCTION: This exam was performed according to the
departmental dose-optimization program which includes automated
exposure control, adjustment of the mA and/or kV according to
patient size and/or use of iterative reconstruction technique.

CONTRAST:  100mL OMNIPAQUE IOHEXOL 300 MG/ML  SOLN
FINDINGS: CT CHEST FINDINGS

Cardiovascular: Port in the anterior chest wall with tip in distal
SVC. No significant vascular findings. Normal heart size. No
pericardial effusion.

Mediastinum/Nodes: RIGHT lower paratracheal node is decreased in
size measuring 6 mm decreased from 12 mm. Node is now more dense.
Subcarinal node is also decreased in volume measuring 4 mm compared
to 06/08 mm. No supraclavicular adenopathy. No hilar adenopathy

Lungs/Pleura:

Again demonstrated mild nodular thickening along the oblique
fissures. Interstitial thickening within the RIGHT middle lobe and
lingula. No discrete measurable nodularity.

Bilateral pleural effusions are similar.

Within the RIGHT upper lobe increased peribronchial interstitial
thickening along the mediastinal border (image 56/4)

Musculoskeletal: Stable sclerotic lesions in the thoracic spine and
sternum.

CT ABDOMEN AND PELVIS FINDINGS

Hepatobiliary: Hypoenhancing lesions in liver again demonstrated.
Lesion in the central LEFT hepatic lobe measures 9 mm (image 51/2)
decreased from 14 mm.

Lesion inferior RIGHT hepatic lobe measuring 10 mm (image 66)
decreased from 14 mm. Lesion in the lateral segment LEFT hepatic
lobe measuring 9 mm (image 55) decreased from 16 mm. No new hepatic
lesions.

Pancreas: Pancreas is normal. No ductal dilatation. No pancreatic
inflammation.

Spleen: Normal spleen

Adrenals/urinary tract: LEFT adrenal gland is enlarged to 12 mm in
width compared to 13 mm. RIGHT adrenal gland normal. Kidneys ureters
and bladder normal.

Stomach/Bowel: Stomach, small bowel, appendix, and cecum are normal.
The colon and rectosigmoid colon are normal.

Vascular/Lymphatic: Abdominal aorta is normal caliber with
atherosclerotic calcification. There is no retroperitoneal or
periportal lymphadenopathy. No pelvic lymphadenopathy.

Reproductive: Insert clear adnexa

Other: No free fluid.

Musculoskeletal: Stable sclerotic lesions in the pelvis sacrum and
lumbar spine
IMPRESSION: Chest Impression:

1. Interval decrease in size of mediastinal lymph nodes and
increased in density.
2. Stable pleural thickening along the oblique fissures and stable
interstitial thickening in the lingula and middle lobe. No
measurable pulmonary nodularity.
3. Increased peribronchial thickening medial RIGHT upper lobe is
favored benign.
4. Stable sclerotic metastasis in the thoracic spine and sternum.

Abdomen / Pelvis Impression:

1. Interval decrease in size of hypoenhancing lesion in the liver.
No new hepatic lesions.
2. Stable LEFT adrenal metastasis.
3. No metastatic adenopathy in the abdomen pelvis.
4. Stable sclerotic metastasis in the pelvis in lumbar spine.

## 2021-11-24 MED ORDER — IOHEXOL 300 MG/ML  SOLN
100.0000 mL | Freq: Once | INTRAMUSCULAR | Status: AC | PRN
  Administered 2021-11-24: 100 mL via INTRAVENOUS

## 2021-11-27 ENCOUNTER — Other Ambulatory Visit (HOSPITAL_COMMUNITY): Payer: Self-pay

## 2021-11-27 ENCOUNTER — Other Ambulatory Visit: Payer: Self-pay | Admitting: Oncology

## 2021-11-27 DIAGNOSIS — C50919 Malignant neoplasm of unspecified site of unspecified female breast: Secondary | ICD-10-CM

## 2021-11-28 ENCOUNTER — Encounter: Payer: Self-pay | Admitting: Oncology

## 2021-11-28 MED ORDER — ABEMACICLIB 100 MG PO TABS
ORAL_TABLET | ORAL | 0 refills | Status: DC
  Filled 2021-12-19: qty 56, 28d supply, fill #0

## 2021-12-01 ENCOUNTER — Other Ambulatory Visit: Payer: Self-pay | Admitting: Oncology

## 2021-12-02 ENCOUNTER — Encounter: Payer: Self-pay | Admitting: Oncology

## 2021-12-02 ENCOUNTER — Inpatient Hospital Stay: Payer: Medicare PPO | Admitting: Oncology

## 2021-12-02 ENCOUNTER — Inpatient Hospital Stay: Payer: Medicare PPO

## 2021-12-02 ENCOUNTER — Other Ambulatory Visit: Payer: Self-pay

## 2021-12-02 ENCOUNTER — Inpatient Hospital Stay: Payer: Medicare PPO | Attending: Oncology

## 2021-12-02 VITALS — BP 117/59 | HR 44 | Resp 18 | Wt 174.0 lb

## 2021-12-02 DIAGNOSIS — C771 Secondary and unspecified malignant neoplasm of intrathoracic lymph nodes: Secondary | ICD-10-CM | POA: Insufficient documentation

## 2021-12-02 DIAGNOSIS — C7951 Secondary malignant neoplasm of bone: Secondary | ICD-10-CM

## 2021-12-02 DIAGNOSIS — Z79899 Other long term (current) drug therapy: Secondary | ICD-10-CM | POA: Diagnosis not present

## 2021-12-02 DIAGNOSIS — Z853 Personal history of malignant neoplasm of breast: Secondary | ICD-10-CM | POA: Diagnosis present

## 2021-12-02 DIAGNOSIS — Z79811 Long term (current) use of aromatase inhibitors: Secondary | ICD-10-CM | POA: Diagnosis not present

## 2021-12-02 DIAGNOSIS — J910 Malignant pleural effusion: Secondary | ICD-10-CM | POA: Diagnosis not present

## 2021-12-02 DIAGNOSIS — C7970 Secondary malignant neoplasm of unspecified adrenal gland: Secondary | ICD-10-CM | POA: Diagnosis not present

## 2021-12-02 DIAGNOSIS — C50919 Malignant neoplasm of unspecified site of unspecified female breast: Secondary | ICD-10-CM

## 2021-12-02 DIAGNOSIS — K521 Toxic gastroenteritis and colitis: Secondary | ICD-10-CM

## 2021-12-02 DIAGNOSIS — Z7983 Long term (current) use of bisphosphonates: Secondary | ICD-10-CM | POA: Diagnosis not present

## 2021-12-02 DIAGNOSIS — T451X5A Adverse effect of antineoplastic and immunosuppressive drugs, initial encounter: Secondary | ICD-10-CM | POA: Diagnosis not present

## 2021-12-02 DIAGNOSIS — Z95828 Presence of other vascular implants and grafts: Secondary | ICD-10-CM

## 2021-12-02 DIAGNOSIS — C787 Secondary malignant neoplasm of liver and intrahepatic bile duct: Secondary | ICD-10-CM | POA: Diagnosis not present

## 2021-12-02 DIAGNOSIS — C7800 Secondary malignant neoplasm of unspecified lung: Secondary | ICD-10-CM | POA: Insufficient documentation

## 2021-12-02 DIAGNOSIS — Z5111 Encounter for antineoplastic chemotherapy: Secondary | ICD-10-CM

## 2021-12-02 DIAGNOSIS — M5126 Other intervertebral disc displacement, lumbar region: Secondary | ICD-10-CM | POA: Insufficient documentation

## 2021-12-02 LAB — COMPREHENSIVE METABOLIC PANEL WITH GFR
ALT: 17 U/L (ref 0–44)
AST: 24 U/L (ref 15–41)
Albumin: 4 g/dL (ref 3.5–5.0)
Alkaline Phosphatase: 65 U/L (ref 38–126)
Anion gap: 6 (ref 5–15)
BUN: 14 mg/dL (ref 8–23)
CO2: 31 mmol/L (ref 22–32)
Calcium: 9.2 mg/dL (ref 8.9–10.3)
Chloride: 99 mmol/L (ref 98–111)
Creatinine, Ser: 0.95 mg/dL (ref 0.44–1.00)
GFR, Estimated: >60 mL/min
Glucose, Bld: 109 mg/dL — ABNORMAL HIGH (ref 70–99)
Potassium: 4.5 mmol/L (ref 3.5–5.1)
Sodium: 136 mmol/L (ref 135–145)
Total Bilirubin: 1.1 mg/dL (ref 0.3–1.2)
Total Protein: 6.9 g/dL (ref 6.5–8.1)

## 2021-12-02 LAB — CBC WITH DIFFERENTIAL/PLATELET
Abs Immature Granulocytes: 0.02 10*3/uL (ref 0.00–0.07)
Basophils Absolute: 0.1 10*3/uL (ref 0.0–0.1)
Basophils Relative: 1 %
Eosinophils Absolute: 0.1 10*3/uL (ref 0.0–0.5)
Eosinophils Relative: 1 %
HCT: 38.4 % (ref 36.0–46.0)
Hemoglobin: 12.5 g/dL (ref 12.0–15.0)
Immature Granulocytes: 0 %
Lymphocytes Relative: 11 %
Lymphs Abs: 0.7 10*3/uL (ref 0.7–4.0)
MCH: 30.5 pg (ref 26.0–34.0)
MCHC: 32.6 g/dL (ref 30.0–36.0)
MCV: 93.7 fL (ref 80.0–100.0)
Monocytes Absolute: 0.5 10*3/uL (ref 0.1–1.0)
Monocytes Relative: 8 %
Neutro Abs: 5 10*3/uL (ref 1.7–7.7)
Neutrophils Relative %: 79 %
Platelets: 227 10*3/uL (ref 150–400)
RBC: 4.1 MIL/uL (ref 3.87–5.11)
RDW: 17.2 % — ABNORMAL HIGH (ref 11.5–15.5)
WBC: 6.3 10*3/uL (ref 4.0–10.5)
nRBC: 0 % (ref 0.0–0.2)

## 2021-12-02 NOTE — Progress Notes (Signed)
Hematology/Oncology Progress note [phone] [phone]      Patient Care Team: Gayland Curry, MD as PCP - General (Family Medicine) Noreene Filbert, MD as Consulting Physician (Radiation Oncology)  REFERRING PROVIDER: Gayland Curry, MD  CHIEF COMPLAINTS/REASON FOR VISIT:  metastatic breast caner  HISTORY OF PRESENTING ILLNESS:   Amber Blevins is a  71 y.o.  female with PMH listed below was seen in consultation at the request of  Gayland Curry, MD  for evaluation of tumor in lumbar vertebrae  # patient reports feeling tightness and pain in Feb 2022. She was started on conservative management which did not help her symptoms. She also started to experience numbness.  She takes gabapentin which partially relieve the discomfort. She denies bowel or bladder incontinence, no lower extremity weakness.   06/25/2021 MRI lumbar spine IMPRESSION:  1. 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 Additional metastatic involvement T12, L1 through L3.  No primary tumor site identified. Top differential considerations are Metastatic Disease Unknown primary, less likely Lymphoma or Multiple Myeloma.   2. Superimposed lumbar spine degeneration, including degenerative moderate to severe left L3 and L4 nerve level impingement from disc herniation.    Patient was seen by neurosurgeon Dr.Yarbrough today. He has ordered MRI cervical and thoracic spine and sacrum biopsy for further work up.   Patient has a history of breast cancer, diagnosed in 2010 Left-sided T2 (2.4cm) N0, ER/PR positive, HER2 negative IDC of the breast, s/p lumpectomy by Dr.Meyer at [facility]. Oncotype score of 11 who completed radiation and she finished 10  years of Femara from 05/2009 and stopped in 2020.  she has intentionally lost some weight, no night sweating, fever.   # 07/02/2021, CT chest abdomen pelvis showed innumerable small pulmonary and pleural nodules consistent with diffuse metastasis.  Associated with probable malignant pleural effusion.  Mediastinal and hilar lymphadenopathy consistent with metastatic disease.  Hepatic and bilateral adrenal gland metastasis.  Diffuse extensive destructive metastatic bone disease involving pelvis.   07/11/2021 - 07/14/2021  Patient was admitted to hospital due to shortness of breath. 07/12/2021, CT chest PE protocol showed no PE.  Further increase of March pleural effusion and admit to moderate interstitial pulmonary edema.  Bulky mediastinal and hilar lymphadenopathy.  Unchanged.  Incompletely visualized left adrenal nodule. 07/12/2021 patient underwent right thoracentesis.  Cytology was positive for metastatic carcinoma, compatible with breast origin.  ER/PR +, HER2 negative   07/16/2021, patient underwent iliac bone biopsy.  Positive for metastatic carcinoma. 07/25/2021, patient underwent thoracentesis of right side, removed 550 cc straw-colored pleural fluid.  She tolerated procedure well.  09/04/2021, CT chest abdomen pelvis showed improvement of the pulmonary nodularity and thickening in the left and the right lung.  Persistent nodular interstitial and pleural thickening remains.  Improvement of mediastinal and hilar adenopathy, hepatic metastatic lesions are similar.  Adrenal gland metastasis is similar.  Interval increase in the sclerotic bone metastasis.  Discussed with patient that I would like to proceed with 2 additional cycles of Taxol.  09/09/2021 Taxol 09/16/2021 Taxol  09/30/2021, started on abemaciclib 100 mg twice daily and letrozole.  INTERVAL HISTORY Amber Blevins is a 71 y.o. female who has above history reviewed by me today presents for follow up visit for management of  metastatic breast cancer . Denies sob, chest  pain today. Anxiety symptom is better with Lorazepam PRN.   Review of Systems  Constitutional:  Negative for appetite change, chills, fatigue and fever.  HENT:   Negative for hearing loss and voice change.   Eyes:  Negative for eye problems.  Respiratory:  Negative for chest tightness and cough.   Cardiovascular:  Negative for chest pain.  Gastrointestinal:  Negative for abdominal distention, abdominal pain, blood in stool and diarrhea.  Endocrine: Negative for hot flashes.  Genitourinary:  Negative for difficulty urinating and frequency.   Musculoskeletal:  Negative for arthralgias and back pain.  Skin:  Negative for itching and rash.  Neurological:  Negative for extremity weakness.  Hematological:  Negative for adenopathy.  Psychiatric/Behavioral:  Negative for confusion.    MEDICAL HISTORY:  Past Medical History:  Diagnosis Date   Breast cancer ([place])    COPD (chronic obstructive pulmonary disease) ([employer] [facility])    Family history of adverse reaction to anesthesia    brother has problem with coming out of anesthesia   High cholesterol    Hypertension    Personal history of radiation therapy    Pre-diabetes     SURGICAL HISTORY: Past Surgical History:  Procedure Laterality Date   BREAST LUMPECTOMY Left    2010   COLONOSCOPY     EYE SURGERY Left    Cataract surgery   PORTACATH PLACEMENT N/A 08/01/2021   Procedure: INSERTION PORT-A-CATH;  Surgeon: Herbert Pun, MD;  Location: [facility] ORS;  Service: General;  Laterality: N/A;   thorocentesis Right 07/10/2021   and another on 07/25/21    SOCIAL HISTORY: Social History   Socioeconomic History   Marital status: Married    Spouse name: Not on file   Number of children: Not on file   Years of education: Not on file   Highest education level: Not on file  Occupational History   Not on file  Tobacco Use   Smoking status: Former    Packs/day: 1.00    Years: 25.00    Pack  years: 25.00    Types: Cigarettes    Quit date: 107    Years since quitting: 33.2   Smokeless tobacco: Never  Vaping Use   Vaping Use: Never used  Substance and Sexual Activity   Alcohol use: Not Currently   Drug use: Never   Sexual activity: Not on file  Other Topics Concern   Not on file  Social History Narrative   Not on file   Social Determinants of Health   Financial Resource Strain: Not on file  Food Insecurity: Not on file  Transportation Needs: Not on file  Physical Activity: Not on file  Stress: Not on file  Social Connections: Not on file  Intimate Partner Violence: Not on file    FAMILY HISTORY: Family History  Problem Relation Age of Onset   Cancer Mother        gynecological   Lung cancer Mother    Diabetes Father    Heart disease Father    Parkinson's disease Father    Brain cancer Brother    Bladder Cancer Brother    Pulmonary disease Brother    Rheumatic fever Brother    Breast cancer Neg Hx     ALLERGIES:  is allergic to green tea (camellia sinensis), melaleuca viridiflora, tea tree oil, and lisinopril.  MEDICATIONS:  Current Outpatient Medications  Medication Sig Dispense Refill   abemaciclib (VERZENIO) 100 MG tablet Take 1 tablet (100 mg total) by mouth 2 (two) times daily.  Swallow tablets whole. Do not chew, crush, or split tablets before swallowing. 56 tablet 0   acetaminophen (TYLENOL) 650 MG CR tablet Take 650 mg by mouth every 8 (eight) hours as needed for pain.     aspirin 81 MG EC tablet Take by mouth.     atorvastatin (LIPITOR) 40 MG tablet Take 40 mg by mouth at bedtime.     Boswellia-Glucosamine-Vit D (OSTEO BI-FLEX-GLUCOS/5-LOXIN) TABS Take 1 capsule by mouth in the morning and at bedtime.     Calcium Carb-Cholecalciferol 600-400 MG-UNIT TABS Take 1 tablet by mouth daily.     diphenoxylate-atropine (LOMOTIL) 2.5-0.025 MG tablet Take 1 tablet by mouth 4 (four) times daily as needed for diarrhea or loose stools. 30 tablet 0    Docusate Sodium (DSS) 100 MG CAPS Take 1 capsule by mouth every other day.     gabapentin (NEURONTIN) 300 MG capsule Take 2 capsules (600 mg total) by mouth 3 (three) times daily. 180 capsule 1   ibuprofen (ADVIL) 200 MG tablet Take by mouth. Take 400 mg by mouth every six (6) hours as needed for pain.     letrozole (FEMARA) 2.5 MG tablet Take 1 tablet (2.5 mg total) by mouth daily. 90 tablet 1   lidocaine-prilocaine (EMLA) cream Apply 1 application topically as needed. 30 g 5   Loperamide HCl (IMODIUM PO) Take by mouth.     LORazepam (ATIVAN) 0.5 MG tablet TAKE 1 TABLET BY MOUTH AT BEDTIME AS NEEDED FOR ANXIETY 30 tablet 0   magnesium chloride (SLOW-MAG) 64 MG TBEC SR tablet Take 1 tablet by mouth at bedtime.     metoprolol-hydrochlorothiazide (LOPRESSOR HCT) 50-25 MG tablet Take 1 tablet by mouth daily. Take 1 tablet by mouth once daily     ondansetron (ZOFRAN) 8 MG tablet Take 1 tablet (8 mg total) by mouth 2 (two) times daily as needed (Nausea or vomiting). 30 tablet 1   oxyCODONE (OXY IR/ROXICODONE) 5 MG immediate release tablet Take 1 tablet (5 mg total) by mouth every 12 (twelve) hours as needed for severe pain or moderate pain. 60 tablet 0   potassium chloride SA (KLOR-CON M) 20 MEQ tablet Take 1 tablet (20 mEq total) by mouth 2 (two) times daily. 60 tablet 0   prochlorperazine (COMPAZINE) 10 MG tablet Take 1 tablet (10 mg total) by mouth every 6 (six) hours as needed (Nausea or vomiting). 30 tablet 1   sucralfate (CARAFATE) 1 GM/10ML suspension Take 10 mLs (1 g total) by mouth 3 (three) times daily. 420 mL 2   Turmeric (QC TUMERIC COMPLEX PO) Take 1 capsule by mouth at bedtime.     albuterol (VENTOLIN HFA) 108 (90 Base) MCG/ACT inhaler Inhale 2 puffs into the lungs every 6 (six) hours as needed. (Patient not taking: Reported on 12/02/2021)     No current facility-administered medications for this visit.     PHYSICAL EXAMINATION: ECOG PERFORMANCE STATUS: 1 - Symptomatic but completely  ambulatory Vitals:   12/02/21 1431  BP: (!) 117/59  Pulse: (!) 44  Resp: 18  SpO2: 100%   Filed Weights   12/02/21 1421  Weight: 174 lb (78.9 kg)    Physical Exam Constitutional:      General: She is not in acute distress. HENT:     Head: Normocephalic and atraumatic.  Eyes:     General: No scleral icterus. Cardiovascular:     Rate and Rhythm: Normal rate and regular rhythm.     Heart sounds: Normal heart sounds.  Pulmonary:  Effort: Pulmonary effort is normal. No respiratory distress.     Breath sounds: No wheezing.     Comments: Decreased breath sound bibasilar Abdominal:     General: Bowel sounds are normal. There is no distension.     Palpations: Abdomen is soft.  Musculoskeletal:        General: No deformity. Normal range of motion.     Cervical back: Normal range of motion and neck supple.  Skin:    General: Skin is warm and dry.     Findings: No erythema or rash.  Neurological:     Mental Status: She is alert and oriented to person, place, and time. Mental status is at baseline.     Cranial Nerves: No cranial nerve deficit.     Coordination: Coordination normal.  Psychiatric:        Mood and Affect: Mood normal.    LABORATORY DATA:  I have reviewed the data as listed Lab Results  Component Value Date   WBC 6.3 12/02/2021   HGB 12.5 12/02/2021   HCT 38.4 12/02/2021   MCV 93.7 12/02/2021   PLT 227 12/02/2021   Recent Labs    11/04/21 0937 11/14/21 1042 12/02/21 1405  NA 137 136 136  K 4.4 3.5 4.5  CL 98 99 99  CO2 _0 GLUCOSE 82 100* 109*  BUN _1 CREATININE 1.04* 0.74 0.95  CALCIUM 9.4 9.1 9.2  GFRNONAA 57* >60 >60  PROT 6.5 6.7 6.9  ALBUMIN 3.9 3.5 4.0  AST 34 23 24  ALT 43 23 17  ALKPHOS 61 59 65  BILITOT 1.1 1.2 1.1    Iron/TIBC/Ferritin/ %Sat No results found for: IRON, TIBC, FERRITIN, IRONPCTSAT    RADIOGRAPHIC STUDIES: I have personally reviewed the radiological images as listed and agreed with the findings in  the report. CT CHEST ABDOMEN PELVIS W CONTRAST  Result Date: 11/25/2021 CLINICAL DATA:  History of breast cancer with radiation and chemotherapy. Surveillance exam. * onc * EXAM: CT CHEST, ABDOMEN, AND PELVIS WITH CONTRAST TECHNIQUE: Multidetector CT imaging of the chest, abdomen and pelvis was performed following the standard protocol during bolus administration of intravenous contrast. RADIATION DOSE REDUCTION: This exam was performed according to the departmental dose-optimization program which includes automated exposure control, adjustment of the mA and/or kV according to patient size and/or use of iterative reconstruction technique. CONTRAST:  137m OMNIPAQUE IOHEXOL 300 MG/ML  SOLN COMPARISON:  09/03/2021 FINDINGS: CT CHEST FINDINGS Cardiovascular: Port in the anterior chest wall with tip in distal SVC. No significant vascular findings. Normal heart size. No pericardial effusion. Mediastinum/Nodes: RIGHT lower paratracheal node is decreased in size measuring 6 mm decreased from 12 mm. Node is now more dense. Subcarinal node is also decreased in volume measuring 4 mm compared to 06/08 mm. No supraclavicular adenopathy. No hilar adenopathy Lungs/Pleura: Again demonstrated mild nodular thickening along the oblique fissures. Interstitial thickening within the RIGHT middle lobe and lingula. No discrete measurable nodularity. Bilateral pleural effusions are similar. Within the RIGHT upper lobe increased peribronchial interstitial thickening along the mediastinal border (image 56/4) Musculoskeletal: Stable sclerotic lesions in the thoracic spine and sternum. CT ABDOMEN AND PELVIS FINDINGS Hepatobiliary: Hypoenhancing lesions in liver again demonstrated. Lesion in the central LEFT hepatic lobe measures 9 mm (image 51/2) decreased from 14 mm. Lesion inferior RIGHT hepatic lobe measuring 10 mm (image 66) decreased from 14 mm. Lesion in the lateral segment LEFT hepatic lobe measuring 9 mm (image 55) decreased from 16  mm.  No new hepatic lesions. Pancreas: Pancreas is normal. No ductal dilatation. No pancreatic inflammation. Spleen: Normal spleen Adrenals/urinary tract: LEFT adrenal gland is enlarged to 12 mm in width compared to 13 mm. RIGHT adrenal gland normal. Kidneys ureters and bladder normal. Stomach/Bowel: Stomach, small bowel, appendix, and cecum are normal. The colon and rectosigmoid colon are normal. Vascular/Lymphatic: Abdominal aorta is normal caliber with atherosclerotic calcification. There is no retroperitoneal or periportal lymphadenopathy. No pelvic lymphadenopathy. Reproductive: Insert clear adnexa Other: No free fluid. Musculoskeletal: Stable sclerotic lesions in the pelvis sacrum and lumbar spine IMPRESSION: Chest Impression: 1. Interval decrease in size of mediastinal lymph nodes and increased in density. 2. Stable pleural thickening along the oblique fissures and stable interstitial thickening in the lingula and middle lobe. No measurable pulmonary nodularity. 3. Increased peribronchial thickening medial RIGHT upper lobe is favored benign. 4. Stable sclerotic metastasis in the thoracic spine and sternum. Abdomen / Pelvis Impression: 1. Interval decrease in size of hypoenhancing lesion in the liver. No new hepatic lesions. 2. Stable LEFT adrenal metastasis. 3. No metastatic adenopathy in the abdomen pelvis. 4. Stable sclerotic metastasis in the pelvis in lumbar spine. Electronically Signed   By: Suzy Bouchard M.D.   On: 11/25/2021 11:58      ASSESSMENT & PLAN:  1. Metastatic breast cancer ([place])   2. Port-A-Cath in place   3. Encounter for antineoplastic chemotherapy   4. Metastasis to bone ([place])   5. Chemotherapy induced diarrhea    # Metastatic Breast cancer with extensive thoracic and bone involvement, in visceral crisis, ER+, PR+ HER2 neg Labs are reviewed and discussed with patient. She tolerates abemaciclib 100 mg twice daily and letrozole well. Continue current regimen.  11/25/2021 CT  chest abdomen pelvis with contrast - partial response.  Interval decrease in size of mediastinal lymph nodes and increased in density.Stable pleural thickening along the oblique fissures and stable interstitial thickening in the lingula and middle lobe. No measurable pulmonary nodularity. Increased peribronchial thickening medial RIGHT upper lobe is favored benign. Stable sclerotic metastasis in the thoracic spine and sternum Interval decrease in size of hypoenhancing lesion in the liver. No new hepatic lesions. Stable LEFT adrenal metastasis. No metastatic adenopathy in the abdomen pelvis. Stable sclerotic metastasis in the pelvis in lumbar spine.   #Chemotherapy-induced diarrhea, continue Imodium and/or Lomotil as instructed as needed. #Grade 2 neuropathy, continue gabapentin, she may increase further to 600 mg 3 times daily.  #Extensive bone metastasis.  Status post palliative radiation to lumbar/sacrum Status post radiation to thoracic spine and cervical spine. Zometa Q3 months. Zometa today. Continue calcium supplementation.  #Anxiety and insomnia, continue low-dose Ativan 0.5 mg QHS PRN #Port-A-Cath in place, recommend port flush every 8 weeks.  All questions were answered. The patient knows to call the clinic with any problems questions or concerns.  cc Gayland Curry, MD    Follow up   Follow-up in 4 weeks, lab MD  treatments.   [name], MD, PhD  12/02/2021

## 2021-12-02 NOTE — Progress Notes (Signed)
Wants to know what she can take on a nightly basis for sleep. ?

## 2021-12-02 NOTE — Progress Notes (Signed)
Nutrition Follow-up: ? ?Patient with metastatic breast cancer with tumor in lumbar vertebrae, pulmonary nodules, hepatic and bilateral adrenal gland, and bone.  Patient started on abemaciclib (1/3) and letrozole (12/22.  ? ?Spoke with patient via phone this am as RD has to be in meeting this afternoon when patient is in clinic.  Patient reports that appetite is good and she is eating well.  Eating a variety of foods including protein.  Has more energy and has been walking about 3500 steps daily.  No nausea.  Reports diarrhea about every 5-7 days but imodium takes care of it.   ? ? ?Anthropometrics:  ? ?Weight 175 lb on 3/6 from [place] PCP ? ?170 lb 3.2 oz on 2/7 ?175 lb 11 oz on 12/13 ?179 lb on 11/29 ? ? ?NUTRITION DIAGNOSIS: Inadequate oral intake improved ? ? ?INTERVENTION:  ?Continue eating well balanced diet including good sources of protein ?Contact information given to patient and patient to call RD if nutrition status changes.   ?  ? ? ?NEXT VISIT: no follow-up ?RD available as needed ? ?[place] Yepiz B. Zenia Resides, RD, LDN ?Registered Dietitian ?[phone] (mobile) ? ? ?

## 2021-12-03 ENCOUNTER — Inpatient Hospital Stay: Payer: Medicare PPO

## 2021-12-03 VITALS — BP 127/55 | HR 42 | Temp 97.3°F | Resp 18

## 2021-12-03 DIAGNOSIS — C7951 Secondary malignant neoplasm of bone: Secondary | ICD-10-CM | POA: Diagnosis not present

## 2021-12-03 DIAGNOSIS — C50919 Malignant neoplasm of unspecified site of unspecified female breast: Secondary | ICD-10-CM

## 2021-12-03 LAB — CANCER ANTIGEN 15-3: CA 15-3: 55.2 U/mL — ABNORMAL HIGH (ref 0.0–25.0)

## 2021-12-03 LAB — CANCER ANTIGEN 27.29: CA 27.29: 75.1 U/mL — ABNORMAL HIGH (ref 0.0–38.6)

## 2021-12-03 MED ORDER — SODIUM CHLORIDE 0.9 % IV SOLN
Freq: Once | INTRAVENOUS | Status: AC
  Filled 2021-12-03: qty 250

## 2021-12-03 MED ORDER — HEPARIN SOD (PORK) LOCK FLUSH 100 UNIT/ML IV SOLN
500.0000 [IU] | Freq: Once | INTRAVENOUS | Status: AC | PRN
  Filled 2021-12-03: qty 5

## 2021-12-03 MED ORDER — HEPARIN SOD (PORK) LOCK FLUSH 100 UNIT/ML IV SOLN
INTRAVENOUS | Status: AC
  Administered 2021-12-03: 500 [IU]
  Filled 2021-12-03: qty 5

## 2021-12-03 MED ORDER — ZOLEDRONIC ACID 4 MG/100ML IV SOLN
4.0000 mg | Freq: Once | INTRAVENOUS | Status: AC
  Administered 2021-12-03: 4 mg via INTRAVENOUS
  Filled 2021-12-03: qty 100

## 2021-12-19 ENCOUNTER — Other Ambulatory Visit (HOSPITAL_COMMUNITY): Payer: Self-pay

## 2021-12-22 ENCOUNTER — Other Ambulatory Visit (HOSPITAL_COMMUNITY): Payer: Self-pay

## 2021-12-23 ENCOUNTER — Encounter: Payer: Self-pay | Admitting: Oncology

## 2021-12-23 ENCOUNTER — Other Ambulatory Visit: Payer: Self-pay | Admitting: Oncology

## 2021-12-23 NOTE — Telephone Encounter (Signed)
Please change port flush on 4/11 to port flush with lab  ?

## 2021-12-23 NOTE — Telephone Encounter (Signed)
Component Ref Range & Units 3 wk ago ?(12/02/21) 1 mo ago ?(11/14/21) 1 mo ago ?(11/04/21) 2 mo ago ?(10/21/21) 2 mo ago ?(10/07/21) 3 mo ago ?(09/16/21) 3 mo ago ?(09/09/21)  ?Potassium 3.5 - 5.1 mmol/L 4.5  3.5  4.4  4.0  3.7  3.3 Low   3.0 Low    ? ?

## 2021-12-23 NOTE — Telephone Encounter (Signed)
Please change port flush on 4/11 to port flush with lab. thanks ?

## 2021-12-26 ENCOUNTER — Encounter: Payer: Self-pay | Admitting: Oncology

## 2021-12-26 MED ORDER — POTASSIUM CHLORIDE CRYS ER 20 MEQ PO TBCR: 20.0000 meq | EXTENDED_RELEASE_TABLET | Freq: Every day | ORAL | 0 refills | Status: DC

## 2021-12-26 NOTE — Telephone Encounter (Signed)
Please advise her to decrease potassium to 61mq daily. BHassan Rowanplease let her know that her K is better, I am decreasing the dose.  ?

## 2021-12-28 ENCOUNTER — Encounter: Payer: Self-pay | Admitting: Oncology

## 2021-12-29 ENCOUNTER — Ambulatory Visit
Admission: RE | Admit: 2021-12-29 | Discharge: 2021-12-29 | Disposition: A | Discharge status: Home / Self Care | Payer: Medicare PPO | Source: Ambulatory Visit | Attending: Oncology | Admitting: Oncology

## 2021-12-29 DIAGNOSIS — C50919 Malignant neoplasm of unspecified site of unspecified female breast: Secondary | ICD-10-CM | POA: Diagnosis present

## 2021-12-29 IMAGING — CR DG CHEST 2V
1 series · 2 of 2 positions shown · non-contrast
Comparison: [DATE], SOONJAE 422

CLINICAL DATA: breast cancer pleural effusion follow up

EXAM:
CHEST - 2 VIEW

[Series 1: dg chest 2 view · 0.14mm/px · 2 of 2 slices shown]
[im 1/2]
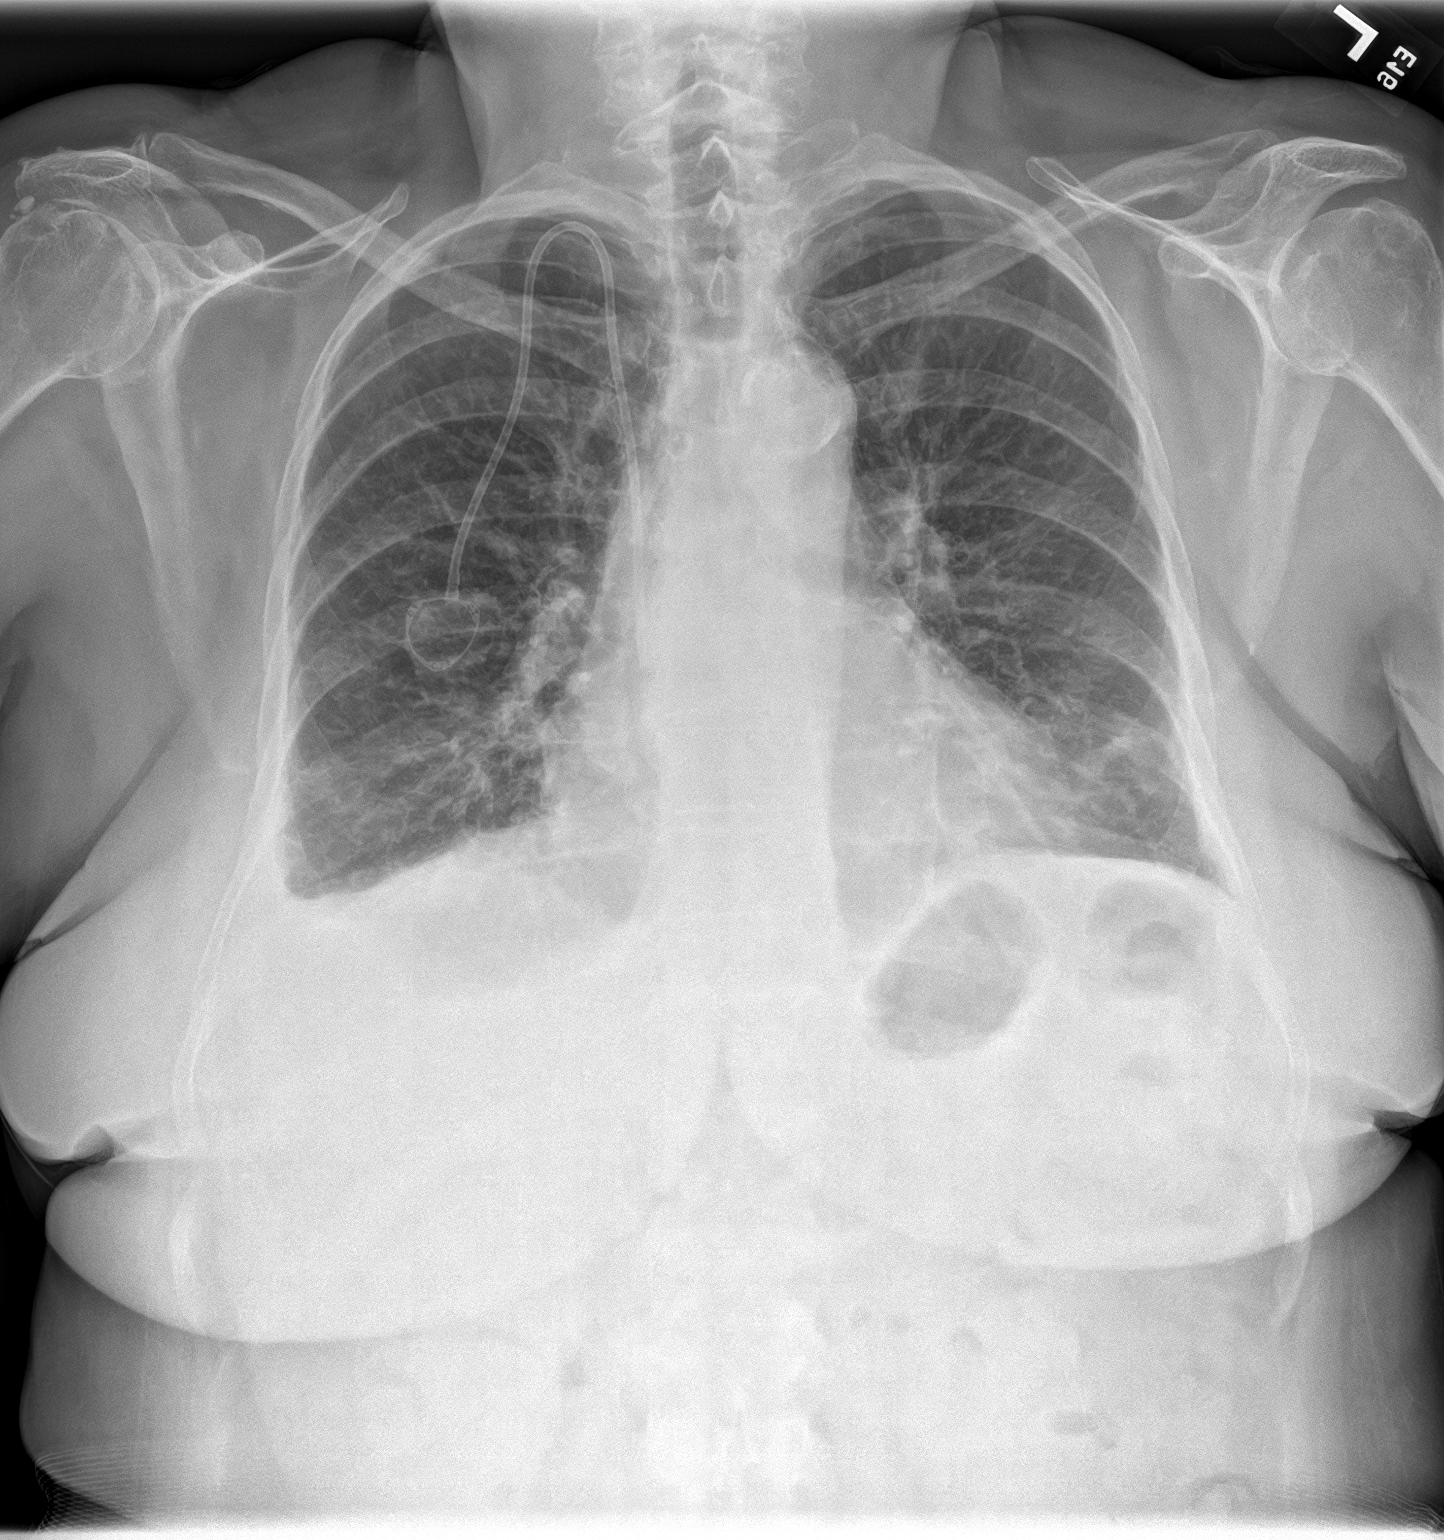
[im 2/2]
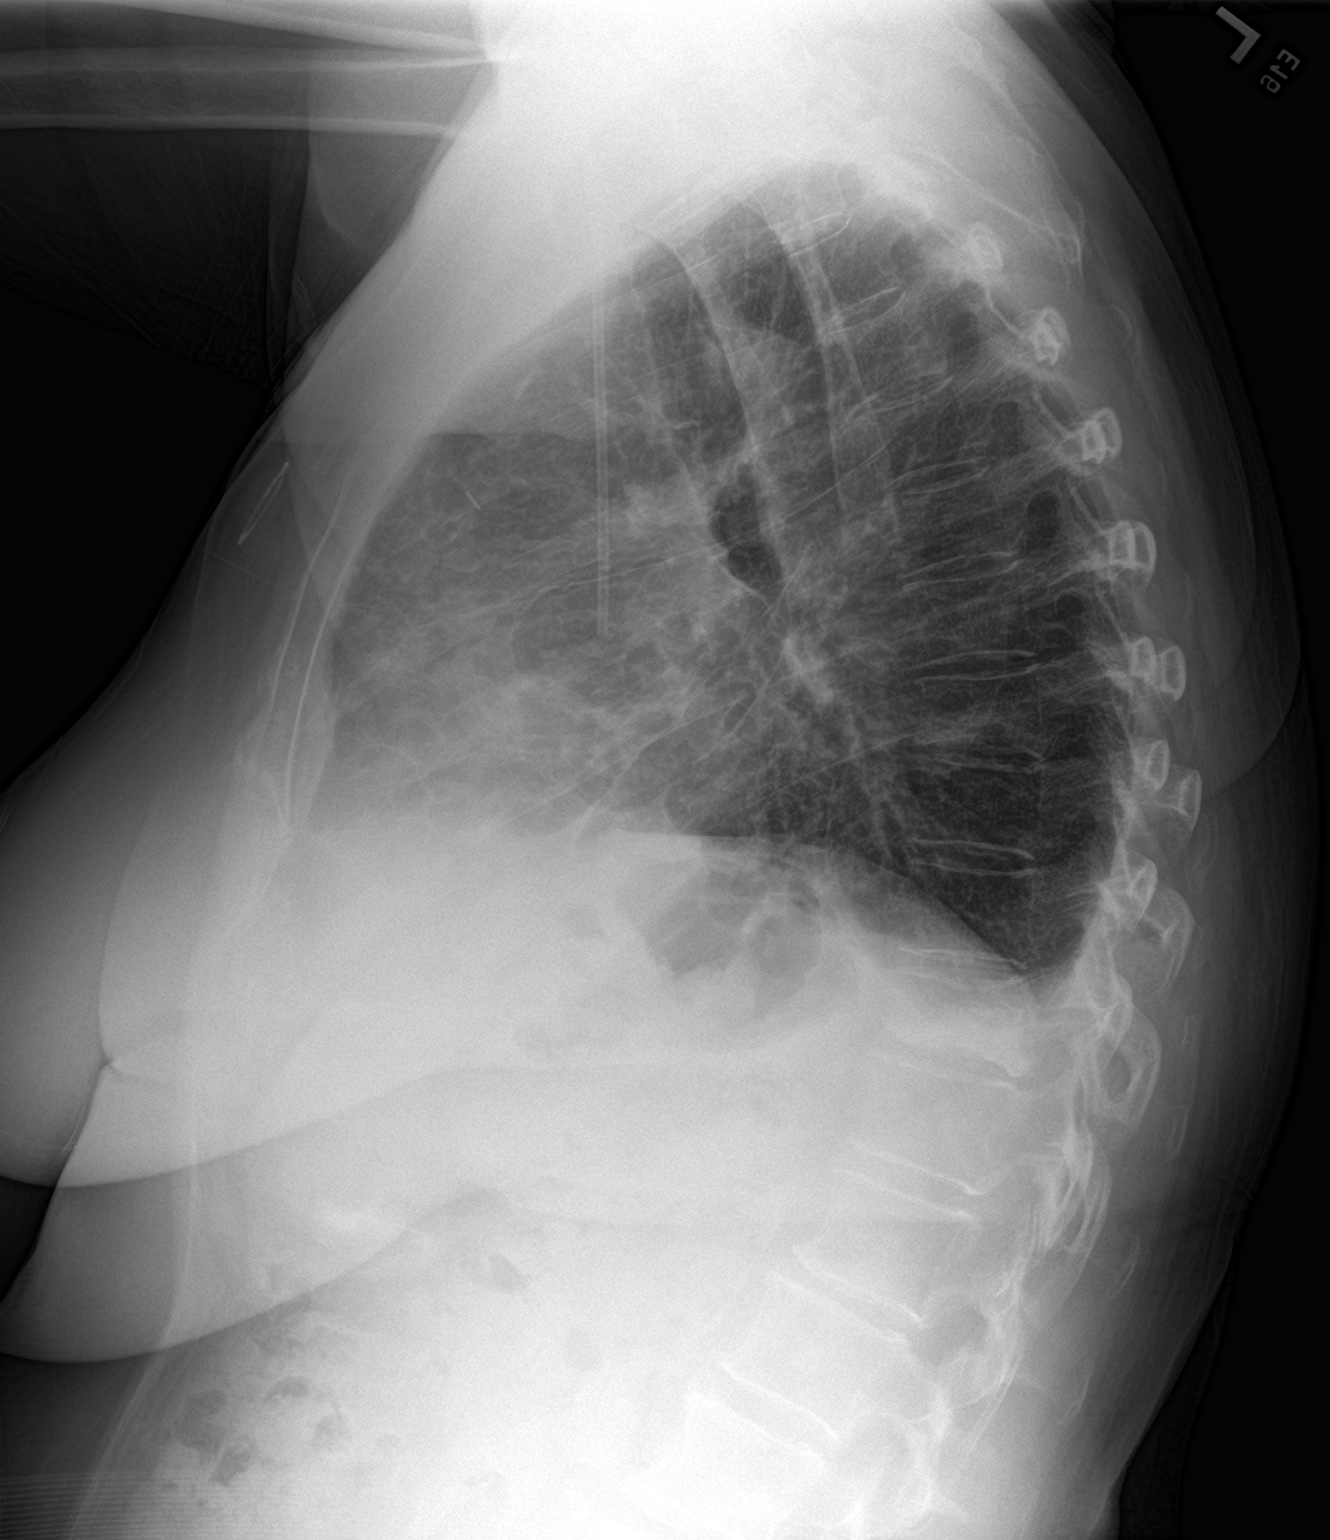

[2 of 2 positions shown; findings below may reference images not displayed]

FINDINGS: Frontal and lateral views of the chest demonstrates stable right
chest wall port. Cardiac silhouette is unremarkable. There is a
small residual right pleural effusion, decreased since recent CT.
Interval resolution of left pleural effusion. Chronic areas of
bibasilar scarring. No acute airspace disease or pneumothorax. No
acute bony abnormalities.
IMPRESSION: 1. Small residual right pleural effusion, decreased since prior
exam.
2. Interval resolution of left pleural effusion.
3. Stable bibasilar scarring.

## 2022-01-06 ENCOUNTER — Inpatient Hospital Stay: Payer: Medicare PPO | Attending: Oncology

## 2022-01-06 ENCOUNTER — Inpatient Hospital Stay (HOSPITAL_BASED_OUTPATIENT_CLINIC_OR_DEPARTMENT_OTHER): Payer: Medicare PPO | Admitting: Oncology

## 2022-01-06 ENCOUNTER — Encounter: Payer: Self-pay | Admitting: Oncology

## 2022-01-06 VITALS — BP 108/71 | HR 75 | Temp 97.0°F | Ht 64.0 in | Wt 172.0 lb

## 2022-01-06 DIAGNOSIS — G4700 Insomnia, unspecified: Secondary | ICD-10-CM | POA: Diagnosis not present

## 2022-01-06 DIAGNOSIS — C771 Secondary and unspecified malignant neoplasm of intrathoracic lymph nodes: Secondary | ICD-10-CM | POA: Insufficient documentation

## 2022-01-06 DIAGNOSIS — Z95828 Presence of other vascular implants and grafts: Secondary | ICD-10-CM | POA: Diagnosis not present

## 2022-01-06 DIAGNOSIS — C7951 Secondary malignant neoplasm of bone: Secondary | ICD-10-CM | POA: Diagnosis not present

## 2022-01-06 DIAGNOSIS — C50919 Malignant neoplasm of unspecified site of unspecified female breast: Secondary | ICD-10-CM

## 2022-01-06 DIAGNOSIS — J910 Malignant pleural effusion: Secondary | ICD-10-CM | POA: Diagnosis not present

## 2022-01-06 DIAGNOSIS — Z79811 Long term (current) use of aromatase inhibitors: Secondary | ICD-10-CM | POA: Insufficient documentation

## 2022-01-06 DIAGNOSIS — C801 Malignant (primary) neoplasm, unspecified: Secondary | ICD-10-CM

## 2022-01-06 DIAGNOSIS — C787 Secondary malignant neoplasm of liver and intrahepatic bile duct: Secondary | ICD-10-CM | POA: Diagnosis not present

## 2022-01-06 DIAGNOSIS — Z5111 Encounter for antineoplastic chemotherapy: Secondary | ICD-10-CM | POA: Diagnosis not present

## 2022-01-06 DIAGNOSIS — F419 Anxiety disorder, unspecified: Secondary | ICD-10-CM | POA: Insufficient documentation

## 2022-01-06 DIAGNOSIS — Z170 Estrogen receptor positive status [ER+]: Secondary | ICD-10-CM | POA: Diagnosis not present

## 2022-01-06 DIAGNOSIS — Z79899 Other long term (current) drug therapy: Secondary | ICD-10-CM | POA: Diagnosis not present

## 2022-01-06 LAB — CBC WITH DIFFERENTIAL/PLATELET
Abs Immature Granulocytes: 0.02 10*3/uL (ref 0.00–0.07)
Basophils Absolute: 0.1 10*3/uL (ref 0.0–0.1)
Basophils Relative: 1 %
Eosinophils Absolute: 0.1 10*3/uL (ref 0.0–0.5)
Eosinophils Relative: 2 %
HCT: 38.7 % (ref 36.0–46.0)
Hemoglobin: 12.5 g/dL (ref 12.0–15.0)
Immature Granulocytes: 0 %
Lymphocytes Relative: 13 %
Lymphs Abs: 0.6 10*3/uL — ABNORMAL LOW (ref 0.7–4.0)
MCH: 30.1 pg (ref 26.0–34.0)
MCHC: 32.3 g/dL (ref 30.0–36.0)
MCV: 93.3 fL (ref 80.0–100.0)
Monocytes Absolute: 0.4 10*3/uL (ref 0.1–1.0)
Monocytes Relative: 9 %
Neutro Abs: 3.5 10*3/uL (ref 1.7–7.7)
Neutrophils Relative %: 75 %
Platelets: 248 10*3/uL (ref 150–400)
RBC: 4.15 MIL/uL (ref 3.87–5.11)
RDW: 16.6 % — ABNORMAL HIGH (ref 11.5–15.5)
WBC: 4.7 10*3/uL (ref 4.0–10.5)
nRBC: 0 % (ref 0.0–0.2)

## 2022-01-06 LAB — COMPREHENSIVE METABOLIC PANEL
ALT: 14 U/L (ref 0–44)
AST: 22 U/L (ref 15–41)
Albumin: 3.8 g/dL (ref 3.5–5.0)
Alkaline Phosphatase: 50 U/L (ref 38–126)
Anion gap: 6 (ref 5–15)
BUN: 14 mg/dL (ref 8–23)
CO2: 28 mmol/L (ref 22–32)
Calcium: 9 mg/dL (ref 8.9–10.3)
Chloride: 103 mmol/L (ref 98–111)
Creatinine, Ser: 0.92 mg/dL (ref 0.44–1.00)
GFR, Estimated: >60 mL/min (ref >60)
Glucose, Bld: 120 mg/dL — ABNORMAL HIGH (ref 70–99)
Potassium: 3.5 mmol/L (ref 3.5–5.1)
Sodium: 137 mmol/L (ref 135–145)
Total Bilirubin: 1 mg/dL (ref 0.3–1.2)
Total Protein: 6.7 g/dL (ref 6.5–8.1)

## 2022-01-06 MED ORDER — POTASSIUM CHLORIDE CRYS ER 20 MEQ PO TBCR: 20.0000 meq | EXTENDED_RELEASE_TABLET | Freq: Every day | ORAL | 0 refills | Status: DC

## 2022-01-06 MED ORDER — ABEMACICLIB 100 MG PO TABS
ORAL_TABLET | ORAL | 3 refills | Status: DC
  Filled 2022-01-06: qty 60, fill #0
  Filled 2022-01-09: qty 56, 28d supply, fill #0
  Filled 2022-02-03: qty 56, 28d supply, fill #1
  Filled 2022-03-06: qty 56, 28d supply, fill #2
  Filled 2022-04-06: qty 56, 28d supply, fill #3

## 2022-01-06 MED ORDER — HEPARIN SOD (PORK) LOCK FLUSH 100 UNIT/ML IV SOLN
500.0000 [IU] | Freq: Once | INTRAVENOUS | Status: AC
  Administered 2022-01-06: 500 [IU] via INTRAVENOUS
  Filled 2022-01-06: qty 5

## 2022-01-06 MED ORDER — SODIUM CHLORIDE 0.9% FLUSH
10.0000 mL | INTRAVENOUS | Status: DC | PRN
  Administered 2022-01-06: 10 mL via INTRAVENOUS
  Filled 2022-01-06: qty 10

## 2022-01-06 NOTE — Progress Notes (Signed)
Patient here for follow up. Patient complains of some swelling in left foot.  ?

## 2022-01-06 NOTE — Progress Notes (Signed)
?Hematology/Oncology Progress note ?Telephone:([phone]) [phone] [phone] ?  ? ? ? ?Patient Care Team: ?Gayland Curry, MD as PCP - General (Family Medicine) ?Noreene Filbert, MD as Consulting Physician (Radiation Oncology) ? ?REFERRING PROVIDER: ?Gayland Curry, MD  ?CHIEF COMPLAINTS/REASON FOR VISIT:  ?metastatic breast caner ? ?HISTORY OF PRESENTING ILLNESS:  ? ?Amber Blevins is a  71 y.o.  female with PMH listed below was seen in consultation at the request of  Gayland Curry, MD  for evaluation of tumor in lumbar vertebrae ? ?# patient reports feeling tightness and pain in Feb 2022. She was started on conservative management which did not help her symptoms. She also started to experience numbness.  She takes gabapentin which partially relieve the discomfort. She denies bowel or bladder incontinence, no lower extremity weakness.  ? ?06/25/2021 MRI lumbar spine ?IMPRESSION:  ?1. 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 Additional metastatic involvement T12, L1 through L3.  ?No primary tumor site identified. Top differential considerations are Metastatic Disease Unknown primary, less likely Lymphoma or Multiple Myeloma.  ? ?2. Superimposed lumbar spine degeneration, including degenerative moderate to severe left L3 and L4 nerve level impingement from disc herniation.  ? ? ?Patient was seen by neurosurgeon Dr.Yarbrough today. He has ordered MRI cervical and thoracic spine and sacrum biopsy for further work up.  ? ?Patient has a history of breast cancer, diagnosed in 2010 ?Left-sided T2 (2.4cm) N0, ER/PR positive, HER2 negative IDC of the breast, s/p lumpectomy by Dr.Meyer at [facility]. Oncotype score of 11 who completed radiation and she finished 10  years of Femara from 05/2009 and stopped in 2020.  ?she has intentionally lost some weight, no night sweating, fever.  ? ?# 07/02/2021, CT chest abdomen pelvis showed innumerable small pulmonary and pleural nodules consistent with diffuse metastasis.  Associated with probable malignant pleural effusion.  Mediastinal and hilar lymphadenopathy consistent with metastatic disease.  Hepatic and bilateral adrenal gland metastasis.  Diffuse extensive destructive metastatic bone disease involving pelvis. ? ? ?07/11/2021 - 07/14/2021  Patient was admitted to hospital due to shortness of breath. ?07/12/2021, CT chest PE protocol showed no PE.  Further increase of March pleural effusion and admit to moderate interstitial pulmonary edema.  Bulky mediastinal and hilar lymphadenopathy.  Unchanged.  Incompletely visualized left adrenal nodule. ?07/12/2021 patient underwent right thoracentesis.  Cytology was positive for metastatic carcinoma, compatible with breast origin.  ER/PR +, HER2 negative ? ? ?07/16/2021, patient underwent iliac bone biopsy.  Positive for metastatic carcinoma. ?07/25/2021, patient underwent thoracentesis of right side, removed 550 cc straw-colored pleural fluid.  She tolerated procedure well. ? ?09/04/2021, CT chest abdomen pelvis showed improvement of the pulmonary nodularity and thickening in the left and the right lung.  Persistent nodular interstitial and pleural thickening remains.  Improvement of mediastinal and hilar adenopathy, hepatic metastatic lesions are similar.  Adrenal gland metastasis is similar.  Interval increase in the sclerotic bone metastasis.  ?Discussed with patient that I would like to proceed with 2 additional cycles of Taxol. ? ?09/09/2021 Taxol ?09/16/2021 Taxol ? ?09/30/2021, started on abemaciclib 100 mg twice daily and letrozole. ?11/25/2021 CT chest abdomen pelvis with contrast - partial response.  ?Interval decrease in size of mediastinal lymph nodes and increased in  density.Stable pleural thickening along the oblique fissures and stable interstitial thickening in  the lingula and middle lobe. No measurable pulmonary nodularity. Increased peribronchial thickening medial RIGHT upper lobe is favored benign. Stable sclerotic metastasis in the thoracic spine and sternum ?Interval decrease in size of hypoenhancing lesion in the liver. No new hepatic lesions. Stable LEFT adrenal metastasis. No metastatic adenopathy in the abdomen pelvis. Stable sclerotic metastasis in the pelvis in lumbar spine. ? ?INTERVAL HISTORY ?Amber Blevins is a 71 y.o. female who has above history reviewed by me today presents for follow up visit for management of metastatic breast cancer . ?Reports feeling well.  She continues to have neuropathy symptoms.  She is on gabapentin 600 mg 3 times daily.  She often forgets the noon dose. ? ? Review of Systems  ?Constitutional:  Negative for appetite change, chills, fatigue and fever.  ?HENT:   Negative for hearing loss and voice change.   ?Eyes:  Negative for eye problems.  ?Respiratory:  Negative for chest tightness and cough.   ?Cardiovascular:  Negative for chest pain.  ?Gastrointestinal:  Negative for abdominal distention, abdominal pain, blood in stool and diarrhea.  ?Endocrine: Negative for hot flashes.  ?Genitourinary:  Negative for difficulty urinating and frequency.   ?Musculoskeletal:  Negative for arthralgias and back pain.  ?Skin:  Negative for itching and rash.  ?Neurological:  Positive for numbness. Negative for extremity weakness.  ?Hematological:  Negative for adenopathy.  ?Psychiatric/Behavioral:  Negative for confusion.   ? ?MEDICAL HISTORY:  ?Past Medical History:  ?Diagnosis Date  ? Breast cancer ([place])   ? COPD (chronic obstructive pulmonary disease) ([place])   ? Family history of adverse reaction to anesthesia   ? brother has problem with coming out of anesthesia  ? High cholesterol   ? Hypertension   ? Personal history of radiation therapy    ? Pre-diabetes   ? ? ?SURGICAL HISTORY: ?Past Surgical History:  ?Procedure Laterality Date  ? BREAST LUMPECTOMY Left   ? 2010  ? COLONOSCOPY    ? EYE SURGERY Left   ? Cataract surgery  ? PORTACATH PLACEMENT N/A 08/01/2021  ? Procedure: INSERTION PORT-A-CATH;  Surgeon: Herbert Pun, MD;  Location: [facility] ORS;  Service: General;  Laterality: N/A;  ? thorocentesis Right 07/10/2021  ? and another on 07/25/21  ? ? ?SOCIAL HISTORY: ?Social History  ? ?Socioeconomic History  ? Marital status: Married  ?  Spouse name: Not on file  ? Number of children: Not on file  ? Years of education: Not on file  ? Highest education level: Not on file  ?Occupational History  ? Not on file  ?Tobacco Use  ? Smoking status: Former  ?  Packs/day: 1.00  ?  Years: 25.00  ?  Pack years: 25.00  ?  Types: Cigarettes  ?  Quit date: 37  ?  Years since quitting: 33.2  ? Smokeless tobacco: Never  ?Vaping Use  ? Vaping Use: Never used  ?Substance and Sexual Activity  ? Alcohol use: Not Currently  ? Drug use: Never  ? Sexual activity: Not on file  ?Other Topics Concern  ? Not on file  ?Social History Narrative  ? Not on file  ? ?Social Determinants of Health  ? ?Financial Resource Strain: Not on file  ?Food Insecurity: Not on file  ?Transportation Needs: Not on file  ?Physical Activity: Not on file  ?Stress: Not on file  ?Social Connections: Not on file  ?Intimate Partner Violence: Not on file  ? ? ?FAMILY HISTORY: ?Family History  ?Problem Relation Age of Onset  ?  Cancer Mother   ?     gynecological  ? Lung cancer Mother   ? Diabetes Father   ? Heart disease Father   ? Parkinson's disease Father   ? Brain cancer Brother   ? Bladder Cancer Brother   ? Pulmonary disease Brother   ? Rheumatic fever Brother   ? Breast cancer Neg Hx   ? ? ?ALLERGIES:  is allergic to green tea (camellia sinensis), melaleuca viridiflora, tea tree oil, and lisinopril. ? ?MEDICATIONS:  ?Current Outpatient Medications  ?Medication Sig Dispense Refill  ? abemaciclib  (VERZENIO) 100 MG tablet Take 1 tablet (100 mg total) by mouth 2 (two) times daily. Swallow tablets whole. Do not chew, crush, or split tablets before swallowing. 56 tablet 0  ? acetaminophen (TYLENOL) 650 MG CR tablet

## 2022-01-07 ENCOUNTER — Other Ambulatory Visit (HOSPITAL_COMMUNITY): Payer: Self-pay

## 2022-01-09 ENCOUNTER — Other Ambulatory Visit (HOSPITAL_COMMUNITY): Payer: Self-pay

## 2022-01-12 ENCOUNTER — Other Ambulatory Visit (HOSPITAL_COMMUNITY): Payer: Self-pay

## 2022-02-03 ENCOUNTER — Other Ambulatory Visit (HOSPITAL_COMMUNITY): Payer: Self-pay

## 2022-02-04 ENCOUNTER — Encounter: Payer: Self-pay | Admitting: Oncology

## 2022-02-04 ENCOUNTER — Inpatient Hospital Stay: Payer: Medicare PPO | Admitting: Oncology

## 2022-02-04 ENCOUNTER — Inpatient Hospital Stay: Payer: Medicare PPO | Attending: Oncology

## 2022-02-04 VITALS — BP 127/57 | HR 44 | Temp 96.8°F | Wt 176.0 lb

## 2022-02-04 DIAGNOSIS — C50919 Malignant neoplasm of unspecified site of unspecified female breast: Secondary | ICD-10-CM

## 2022-02-04 DIAGNOSIS — G620 Drug-induced polyneuropathy: Secondary | ICD-10-CM | POA: Insufficient documentation

## 2022-02-04 DIAGNOSIS — C7800 Secondary malignant neoplasm of unspecified lung: Secondary | ICD-10-CM | POA: Diagnosis present

## 2022-02-04 DIAGNOSIS — C782 Secondary malignant neoplasm of pleura: Secondary | ICD-10-CM | POA: Diagnosis not present

## 2022-02-04 DIAGNOSIS — C7972 Secondary malignant neoplasm of left adrenal gland: Secondary | ICD-10-CM | POA: Insufficient documentation

## 2022-02-04 DIAGNOSIS — Z79899 Other long term (current) drug therapy: Secondary | ICD-10-CM | POA: Diagnosis not present

## 2022-02-04 DIAGNOSIS — C771 Secondary and unspecified malignant neoplasm of intrathoracic lymph nodes: Secondary | ICD-10-CM | POA: Insufficient documentation

## 2022-02-04 DIAGNOSIS — Z853 Personal history of malignant neoplasm of breast: Secondary | ICD-10-CM | POA: Insufficient documentation

## 2022-02-04 DIAGNOSIS — C7951 Secondary malignant neoplasm of bone: Secondary | ICD-10-CM

## 2022-02-04 DIAGNOSIS — C787 Secondary malignant neoplasm of liver and intrahepatic bile duct: Secondary | ICD-10-CM | POA: Diagnosis not present

## 2022-02-04 DIAGNOSIS — Z5111 Encounter for antineoplastic chemotherapy: Secondary | ICD-10-CM

## 2022-02-04 DIAGNOSIS — Z923 Personal history of irradiation: Secondary | ICD-10-CM | POA: Diagnosis not present

## 2022-02-04 DIAGNOSIS — Z92241 Personal history of systemic steroid therapy: Secondary | ICD-10-CM | POA: Diagnosis not present

## 2022-02-04 DIAGNOSIS — C7971 Secondary malignant neoplasm of right adrenal gland: Secondary | ICD-10-CM | POA: Insufficient documentation

## 2022-02-04 DIAGNOSIS — T451X5A Adverse effect of antineoplastic and immunosuppressive drugs, initial encounter: Secondary | ICD-10-CM | POA: Diagnosis not present

## 2022-02-04 DIAGNOSIS — Z95828 Presence of other vascular implants and grafts: Secondary | ICD-10-CM

## 2022-02-04 LAB — CBC WITH DIFFERENTIAL/PLATELET
Abs Immature Granulocytes: 0.01 10*3/uL (ref 0.00–0.07)
Basophils Absolute: 0 10*3/uL (ref 0.0–0.1)
Basophils Relative: 1 %
Eosinophils Absolute: 0.1 10*3/uL (ref 0.0–0.5)
Eosinophils Relative: 1 %
HCT: 35.5 % — ABNORMAL LOW (ref 36.0–46.0)
Hemoglobin: 11.9 g/dL — ABNORMAL LOW (ref 12.0–15.0)
Immature Granulocytes: 0 %
Lymphocytes Relative: 15 %
Lymphs Abs: 0.7 10*3/uL (ref 0.7–4.0)
MCH: 31.9 pg (ref 26.0–34.0)
MCHC: 33.5 g/dL (ref 30.0–36.0)
MCV: 95.2 fL (ref 80.0–100.0)
Monocytes Absolute: 0.5 10*3/uL (ref 0.1–1.0)
Monocytes Relative: 11 %
Neutro Abs: 3.2 10*3/uL (ref 1.7–7.7)
Neutrophils Relative %: 72 %
Platelets: 251 10*3/uL (ref 150–400)
RBC: 3.73 MIL/uL — ABNORMAL LOW (ref 3.87–5.11)
RDW: 15.9 % — ABNORMAL HIGH (ref 11.5–15.5)
WBC: 4.5 10*3/uL (ref 4.0–10.5)
nRBC: 0 % (ref 0.0–0.2)

## 2022-02-04 LAB — COMPREHENSIVE METABOLIC PANEL
ALT: 18 U/L (ref 0–44)
AST: 27 U/L (ref 15–41)
Albumin: 3.8 g/dL (ref 3.5–5.0)
Alkaline Phosphatase: 48 U/L (ref 38–126)
Anion gap: 6 (ref 5–15)
BUN: 15 mg/dL (ref 8–23)
CO2: 28 mmol/L (ref 22–32)
Calcium: 9.2 mg/dL (ref 8.9–10.3)
Chloride: 102 mmol/L (ref 98–111)
Creatinine, Ser: 0.84 mg/dL (ref 0.44–1.00)
GFR, Estimated: >60 mL/min (ref >60)
Glucose, Bld: 108 mg/dL — ABNORMAL HIGH (ref 70–99)
Potassium: 3.5 mmol/L (ref 3.5–5.1)
Sodium: 136 mmol/L (ref 135–145)
Total Bilirubin: 1.4 mg/dL — ABNORMAL HIGH (ref 0.3–1.2)
Total Protein: 6.7 g/dL (ref 6.5–8.1)

## 2022-02-04 MED ORDER — SODIUM CHLORIDE 0.9% FLUSH
10.0000 mL | Freq: Once | INTRAVENOUS | Status: AC
  Administered 2022-02-04: 10 mL via INTRAVENOUS
  Filled 2022-02-04: qty 10

## 2022-02-04 MED ORDER — HEPARIN SOD (PORK) LOCK FLUSH 100 UNIT/ML IV SOLN
500.0000 [IU] | Freq: Once | INTRAVENOUS | Status: AC
  Administered 2022-02-04: 500 [IU] via INTRAVENOUS
  Filled 2022-02-04: qty 5

## 2022-02-04 MED ORDER — LETROZOLE 2.5 MG PO TABS: 2.5000 mg | ORAL_TABLET | Freq: Every day | ORAL | 2 refills | Status: DC

## 2022-02-04 NOTE — Progress Notes (Signed)
?Hematology/Oncology Progress note ?Telephone:(336) [phone] [phone] ?  ? ? ? ?Patient Care Team: ?Gayland Curry, MD as PCP - General (Family Medicine) ?Noreene Filbert, MD as Consulting Physician (Radiation Oncology) ? ?REFERRING PROVIDER: ?Gayland Curry, MD  ?CHIEF COMPLAINTS/REASON FOR VISIT:  ?metastatic breast caner ? ?HISTORY OF PRESENTING ILLNESS:  ? ?Amber Blevins is a  71 y.o.  female with PMH listed below was seen in consultation at the request of  Gayland Curry, MD  for evaluation of tumor in lumbar vertebrae ? ?# patient reports feeling tightness and pain in Feb 2022. She was started on conservative management which did not help her symptoms. She also started to experience numbness.  She takes gabapentin which partially relieve the discomfort. She denies bowel or bladder incontinence, no lower extremity weakness.  ? ?06/25/2021 MRI lumbar spine ?IMPRESSION:  ?1. 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 Additional metastatic involvement T12, L1 through L3.  ?No primary tumor site identified. Top differential considerations are Metastatic Disease Unknown primary, less likely Lymphoma or Multiple Myeloma.  ? ?2. Superimposed lumbar spine degeneration, including degenerative moderate to severe left L3 and L4 nerve level impingement from disc herniation.  ? ? ?Patient was seen by neurosurgeon Dr.Yarbrough today. He has ordered MRI cervical and thoracic spine and sacrum biopsy for further work up.  ? ?Patient has a history of breast cancer, diagnosed in 2010 ?Left-sided T2 (2.4cm) N0, ER/PR positive, HER2 negative IDC of the breast, s/p lumpectomy by Dr.Meyer at [facility]. Oncotype score of 11 who completed radiation and she finished 10  years of Femara from 05/2009 and stopped in 2020.  ?she has intentionally lost some weight, no night sweating, fever.  ? ?# 07/02/2021, CT chest abdomen pelvis showed innumerable small pulmonary and pleural nodules consistent with diffuse metastasis.  Associated with probable malignant pleural effusion.  Mediastinal and hilar lymphadenopathy consistent with metastatic disease.  Hepatic and bilateral adrenal gland metastasis.  Diffuse extensive destructive metastatic bone disease involving pelvis. ? ? ?07/11/2021 - 07/14/2021  Patient was admitted to hospital due to shortness of breath. ?07/12/2021, CT chest PE protocol showed no PE.  Further increase of March pleural effusion and admit to moderate interstitial pulmonary edema.  Bulky mediastinal and hilar lymphadenopathy.  Unchanged.  Incompletely visualized left adrenal nodule. ?07/12/2021 patient underwent right thoracentesis.  Cytology was positive for metastatic carcinoma, compatible with breast origin.  ER/PR +, HER2 negative ? ? ?07/16/2021, patient underwent iliac bone biopsy.  Positive for metastatic carcinoma. ?07/25/2021, patient underwent thoracentesis of right side, removed 550 cc straw-colored pleural fluid.  She tolerated procedure well. ? ?09/04/2021, CT chest abdomen pelvis showed improvement of the pulmonary nodularity and thickening in the left and the right lung.  Persistent nodular interstitial and pleural thickening remains.  Improvement of mediastinal and hilar adenopathy, hepatic metastatic lesions are similar.  Adrenal gland metastasis is similar.  Interval increase in the sclerotic bone metastasis.  ?Discussed with patient that I would like to proceed with 2 additional cycles of Taxol. ? ?09/09/2021 Taxol ?09/16/2021 Taxol ? ?09/30/2021, started on abemaciclib 100 mg twice daily and letrozole. ?11/25/2021 CT chest abdomen pelvis with contrast - partial response.  ?Interval decrease in size of mediastinal lymph nodes and increased in  density.Stable pleural thickening along the oblique fissures and stable interstitial thickening in  the lingula and middle lobe. No measurable pulmonary nodularity. Increased peribronchial thickening medial RIGHT upper lobe is favored benign. Stable sclerotic metastasis in the thoracic spine and sternum ?Interval decrease in size of hypoenhancing lesion in the liver. No new hepatic lesions. Stable LEFT adrenal metastasis. No metastatic adenopathy in the abdomen pelvis. Stable sclerotic metastasis in the pelvis in lumbar spine. ? ?INTERVAL HISTORY ?Amber Blevins is a 71 y.o. female who has above history reviewed by me today presents for follow up visit for management of metastatic breast cancer . ?Reports feeling well.    She is on gabapentin 600 mg 2 times daily.  She often forgets the noon dose.  Partial symptom relief ?Marland Kitchen  Does not sleep very well.  Otherwise no new complaints.  Denies shortness of breath. ? ? Review of Systems  ?Constitutional:  Negative for appetite change, chills, fatigue and fever.  ?HENT:   Negative for hearing loss and voice change.   ?Eyes:  Negative for eye problems.  ?Respiratory:  Negative for chest tightness and cough.   ?Cardiovascular:  Negative for chest pain.  ?Gastrointestinal:  Negative for abdominal distention, abdominal pain, blood in stool and diarrhea.  ?Endocrine: Negative for hot flashes.  ?Genitourinary:  Negative for difficulty urinating and frequency.   ?Musculoskeletal:  Negative for arthralgias and back pain.  ?Skin:  Negative for itching and rash.  ?Neurological:  Positive for numbness. Negative for extremity weakness.  ?Hematological:  Negative for adenopathy.  ?Psychiatric/Behavioral:  Positive for sleep disturbance. Negative for confusion.   ? ?MEDICAL HISTORY:  ?Past Medical History:  ?Diagnosis Date  ? Breast cancer ([place])   ? COPD (chronic obstructive pulmonary disease) ([place])   ? Family history of adverse reaction to anesthesia   ? brother has problem with  coming out of anesthesia  ? High cholesterol   ? Hypertension   ? Personal history of radiation therapy   ? Pre-diabetes   ? ? ?SURGICAL HISTORY: ?Past Surgical History:  ?Procedure Laterality Date  ? BREAST LUMPECTOMY Left   ? 2010  ? COLONOSCOPY    ? EYE SURGERY Left   ? Cataract surgery  ? PORTACATH PLACEMENT N/A 08/01/2021  ? Procedure: INSERTION PORT-A-CATH;  Surgeon: Herbert Pun, MD;  Location: [facility] ORS;  Service: General;  Laterality: N/A;  ? thorocentesis Right 07/10/2021  ? and another on 07/25/21  ? ? ?SOCIAL HISTORY: ?Social History  ? ?Socioeconomic History  ? Marital status: Married  ?  Spouse name: Not on file  ? Number of children: Not on file  ? Years of education: Not on file  ? Highest education level: Not on file  ?Occupational History  ? Not on file  ?Tobacco Use  ? Smoking status: Former  ?  Packs/day: 1.00  ?  Years: 25.00  ?  Pack years: 25.00  ?  Types: Cigarettes  ?  Quit date: 61  ?  Years since quitting: 33.3  ? Smokeless tobacco: Never  ?Vaping Use  ? Vaping Use: Never used  ?Substance and Sexual Activity  ? Alcohol use: Not Currently  ? Drug use: Never  ? Sexual activity: Not on file  ?Other Topics Concern  ? Not on file  ?Social History Narrative  ? Not on file  ? ?Social Determinants of Health  ? ?Financial Resource Strain: Not on file  ?Food Insecurity: Not on file  ?Transportation Needs: Not on file  ?Physical Activity: Not on file  ?Stress: Not on file  ?Social Connections: Not on file  ?  Intimate Partner Violence: Not on file  ? ? ?FAMILY HISTORY: ?Family History  ?Problem Relation Age of Onset  ? Cancer Mother   ?     gynecological  ? Lung cancer Mother   ? Diabetes Father   ? Heart disease Father   ? Parkinson's disease Father   ? Brain cancer Brother   ? Bladder Cancer Brother   ? Pulmonary disease Brother   ? Rheumatic fever Brother   ? Breast cancer Neg Hx   ? ? ?ALLERGIES:  is allergic to green tea (camellia sinensis), melaleuca viridiflora, tea tree oil, and  lisinopril. ? ?MEDICATIONS:  ?Current Outpatient Medications  ?Medication Sig Dispense Refill  ? abemaciclib (VERZENIO) 100 MG tablet Take 1 tablet (100 mg total) by mouth 2 (two) times daily. Swallow tablets whole. Do not c

## 2022-02-05 LAB — CANCER ANTIGEN 15-3: CA 15-3: 37.7 U/mL — ABNORMAL HIGH (ref 0.0–25.0)

## 2022-02-05 LAB — CANCER ANTIGEN 27.29: CA 27.29: 40.7 U/mL — ABNORMAL HIGH (ref 0.0–38.6)

## 2022-02-11 ENCOUNTER — Other Ambulatory Visit (HOSPITAL_COMMUNITY): Payer: Self-pay

## 2022-02-24 ENCOUNTER — Ambulatory Visit
Admission: RE | Admit: 2022-02-24 | Discharge: 2022-02-24 | Disposition: A | Discharge status: Home / Self Care | Payer: Medicare PPO | Source: Ambulatory Visit | Attending: Oncology | Admitting: Oncology

## 2022-02-24 DIAGNOSIS — K769 Liver disease, unspecified: Secondary | ICD-10-CM | POA: Diagnosis not present

## 2022-02-24 DIAGNOSIS — I700 Atherosclerosis of aorta: Secondary | ICD-10-CM | POA: Insufficient documentation

## 2022-02-24 DIAGNOSIS — C7951 Secondary malignant neoplasm of bone: Secondary | ICD-10-CM | POA: Insufficient documentation

## 2022-02-24 DIAGNOSIS — C50919 Malignant neoplasm of unspecified site of unspecified female breast: Secondary | ICD-10-CM | POA: Diagnosis present

## 2022-02-24 DIAGNOSIS — C7972 Secondary malignant neoplasm of left adrenal gland: Secondary | ICD-10-CM | POA: Diagnosis not present

## 2022-02-24 DIAGNOSIS — I2510 Atherosclerotic heart disease of native coronary artery without angina pectoris: Secondary | ICD-10-CM | POA: Diagnosis not present

## 2022-02-24 IMAGING — CT CT CHEST-ABD-PELV W/ CM
2 of 5 series · 13 of 36 positions shown, 15 images · IV contrast (agent unspecified)
Comparison: [DATE]

CLINICAL DATA: Metastatic breast cancer restaging, ongoing
chemotherapy * Tracking Code: BO *

EXAM:
CT CHEST, ABDOMEN, AND PELVIS WITH CONTRAST
TECHNIQUE: Multidetector CT imaging of the chest, abdomen and pelvis was
performed following the standard protocol during bolus
administration of intravenous contrast.

[Series 2: cap with · axial · 0.78mm/px · z∈[-311,+204]mm · 10 of 127 slices shown, 12 images]
[im 12/127  mediastinal]
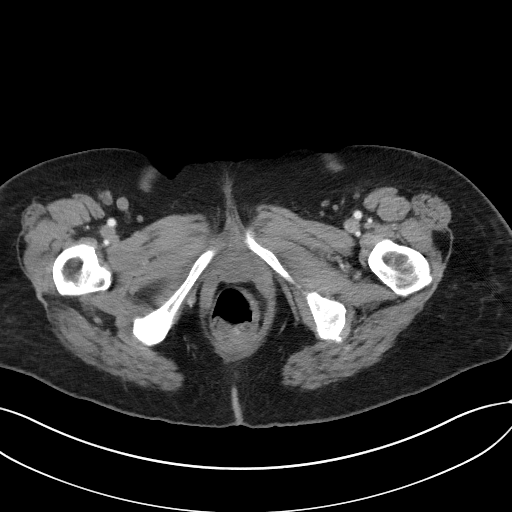
[im 12/127  bone]
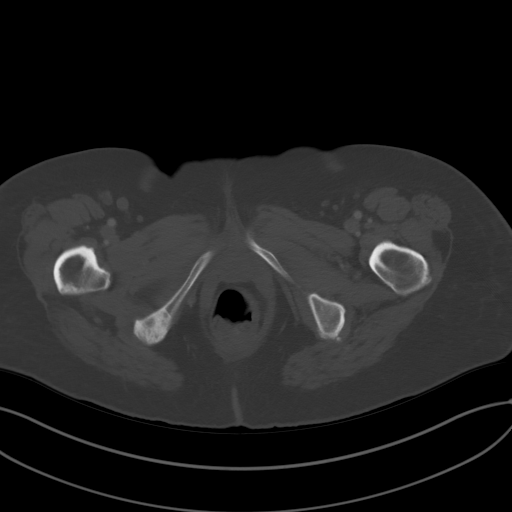
[im 23/127  mediastinal]
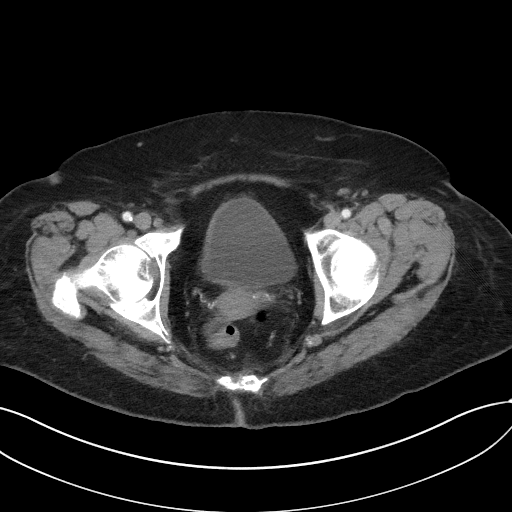
[im 35/127  mediastinal]
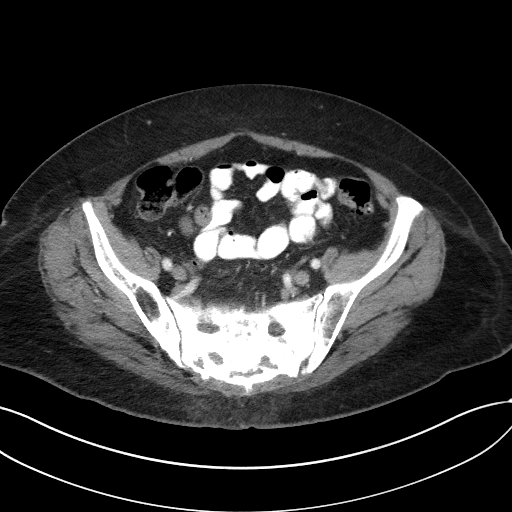
[im 46/127  mediastinal]
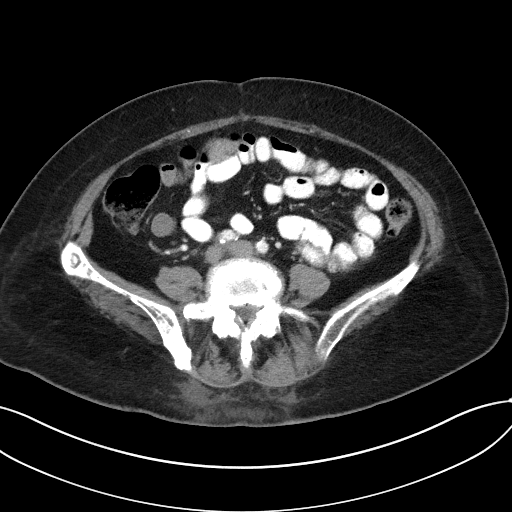
[im 58/127  mediastinal]
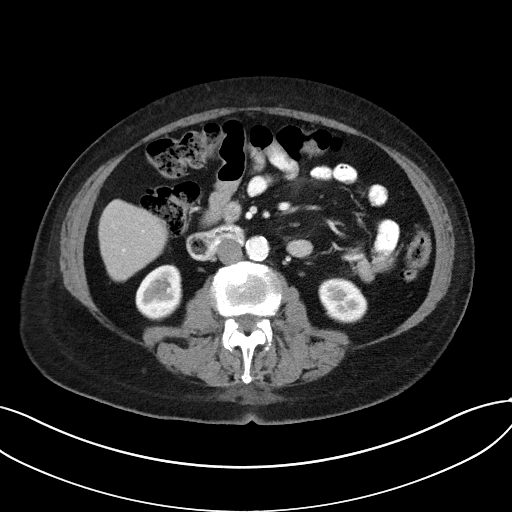
[im 69/127  mediastinal]
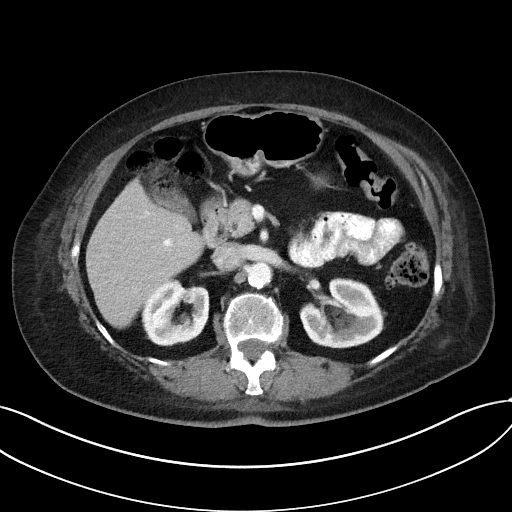
[im 81/127  mediastinal]
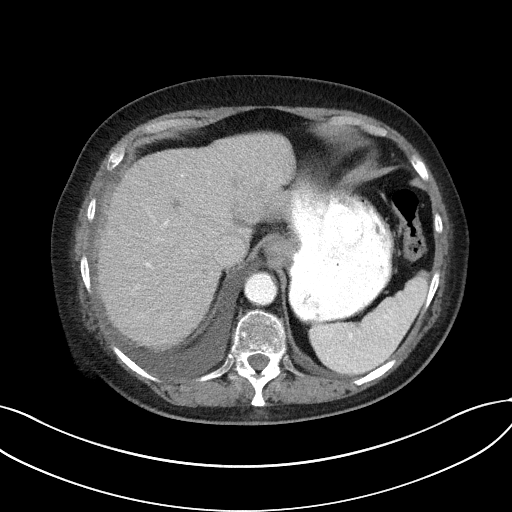
[im 92/127  mediastinal]
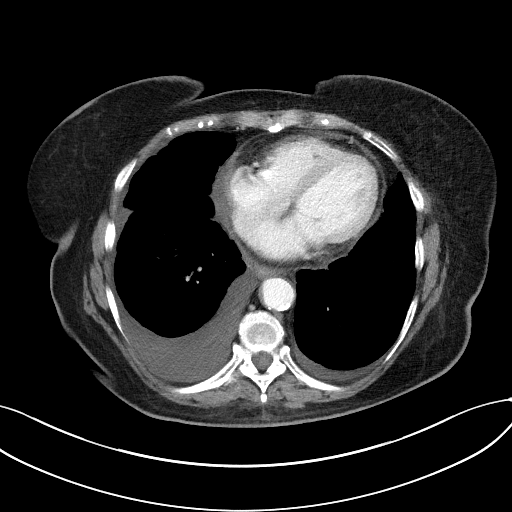
[im 104/127  mediastinal]
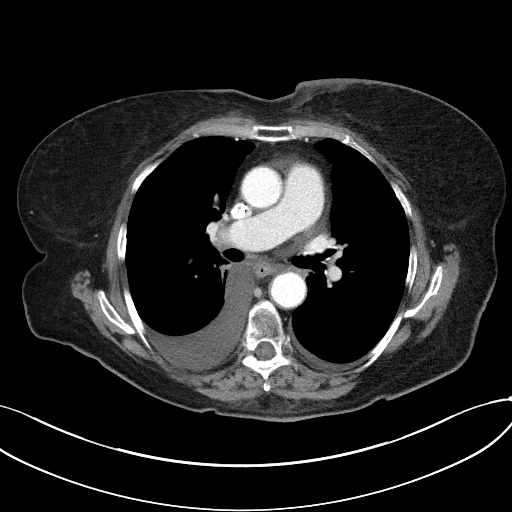
[im 104/127  bone]
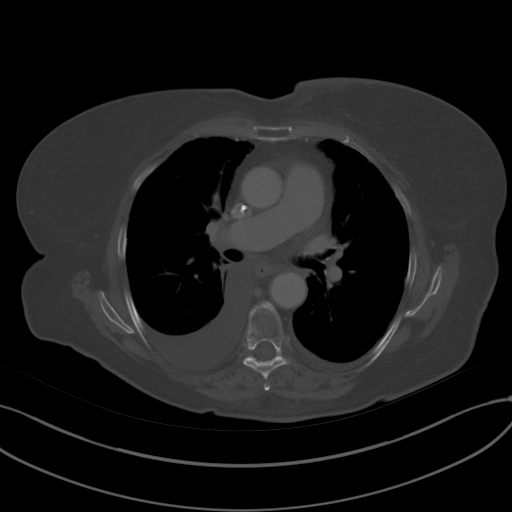
[im 115/127  mediastinal]
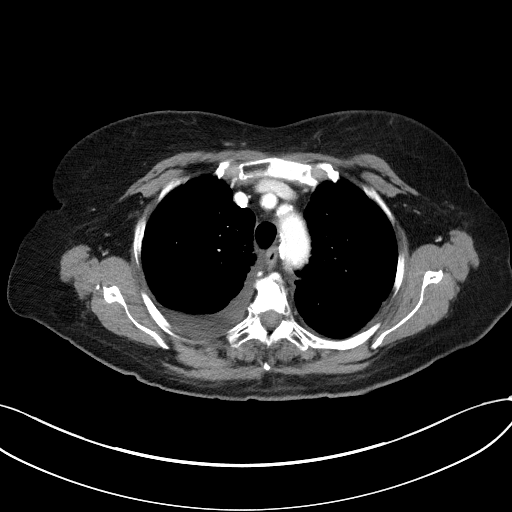

[Series 4: coronals · coronal · 0.77mm/px · 3 of 141 slices shown]
[im 29/141  mediastinal]
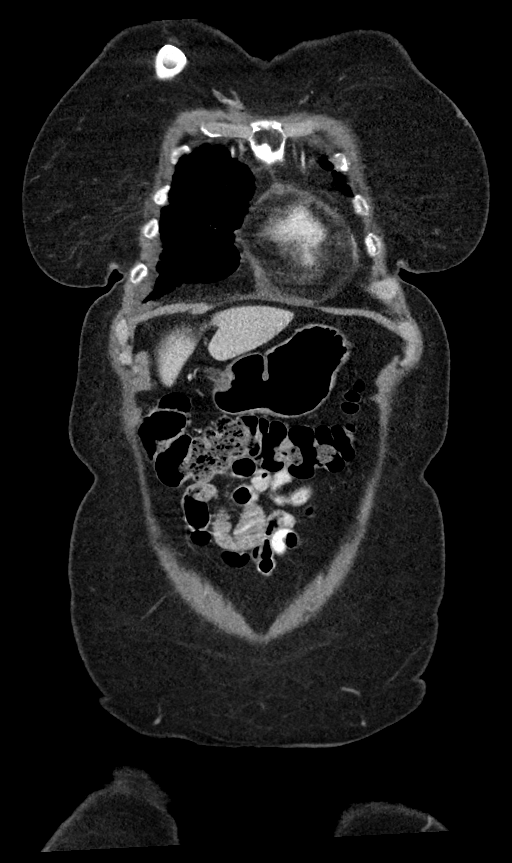
[im 57/141  mediastinal]
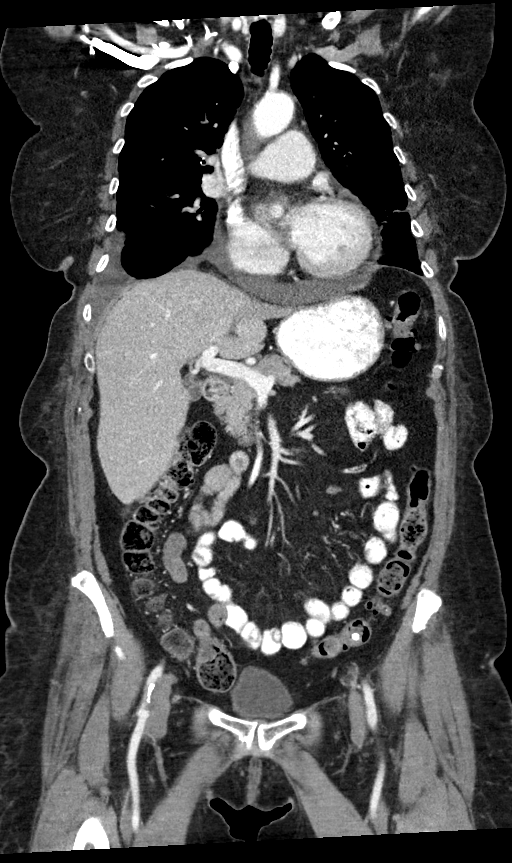
[im 85/141  mediastinal]
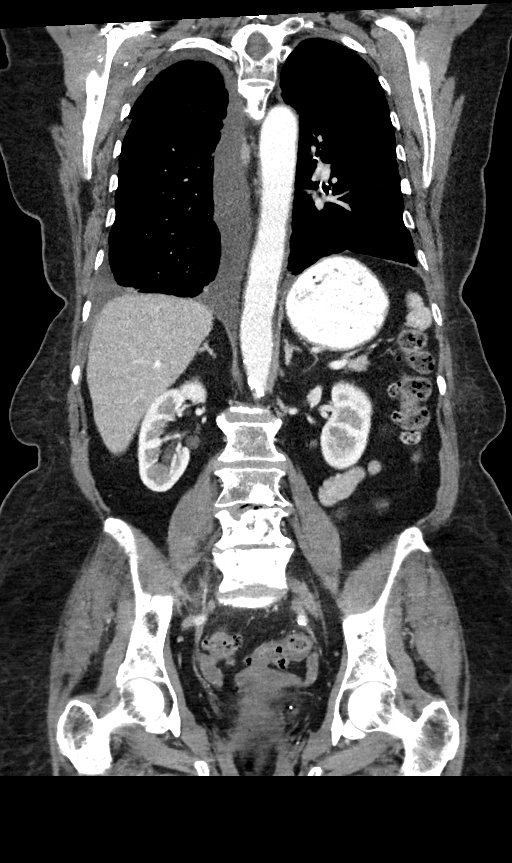

[13 of 36 positions shown; findings below may reference images not displayed]

RADIATION DOSE REDUCTION: This exam was performed according to the
departmental dose-optimization program which includes automated
exposure control, adjustment of the mA and/or kV according to
patient size and/or use of iterative reconstruction technique.

CONTRAST:  100mL OMNIPAQUE IOHEXOL 300 MG/ML SOLN, additional oral
enteric contrast
FINDINGS: CT CHEST FINDINGS

Cardiovascular: Right chest port catheter. Aortic atherosclerosis.
Normal heart size. Left coronary artery calcifications. Trace
pericardial effusion.

Mediastinum/Nodes: No enlarged mediastinal, hilar, or axillary lymph
nodes. Thyroid gland, trachea, and esophagus demonstrate no
significant findings.

Lungs/Pleura: Moderate right, small left pleural effusions, similar
to prior examination. No pleural nodularity or thickening noted.
Mild interlobular septal thickening predominantly involving the
unchanged minimal bilateral paramedian radiation fibrosis (series 6,
image 56).

Musculoskeletal: Skin thickening of the left breast.

CT ABDOMEN PELVIS FINDINGS

Hepatobiliary: Stable or minimally diminished subcentimeter liver
lesions, for example a lesion measuring 0.8 cm, previously 1.0 cm
inferior right lobe of the liver, hepatic segment V/VI (series 2,
image 61), and an unchanged lesion of the anterior liver dome,
hepatic segment VIII, measuring 0.8 cm (series 2, image 47). No
gallstones, gallbladder wall thickening, or biliary dilatation.

Pancreas: Unremarkable. No pancreatic ductal dilatation or
surrounding inflammatory changes.

Spleen: Normal in size without significant abnormality.

Adrenals/Urinary Tract: Unchanged left adrenal nodule measuring
x 1.2 cm (series 2, image 54). Kidneys are normal, without renal
calculi, solid lesion, or hydronephrosis. Bladder is unremarkable.

Stomach/Bowel: Stomach is within normal limits. Appendix appears
normal. No evidence of bowel wall thickening, distention, or
inflammatory changes. Descending and sigmoid diverticulosis.

Vascular/Lymphatic: Aortic atherosclerosis. No enlarged abdominal or
pelvic lymph nodes.

Reproductive: No mass or other abnormality.

Other: No abdominal wall hernia or abnormality. No ascites.

Musculoskeletal: No acute osseous findings. Unchanged widespread
sclerotic osseous metastatic disease involving the included axial
and appendicular skeleton.
IMPRESSION: 1. Unchanged bilateral pleural effusions with associated
interlobular septal thickening of the lung bases, however without
discrete nodularity, consistent with stable appearance of treated
pleural and lymphangitic metastatic disease.
2. Stable or minimally diminished subcentimeter liver lesions,
consistent with stable or slightly improved hepatic metastatic
disease.
3. Unchanged left adrenal metastasis. Previously noted right adrenal
metastasis is not appreciated.
4. Unchanged widespread sclerotic osseous metastatic disease
involving the included axial and appendicular skeleton.
5. No evidence of new metastatic disease in the chest, abdomen or
pelvis.
6. Coronary artery disease.

Aortic Atherosclerosis ([T0]-[T0]).

## 2022-02-24 MED ORDER — IOHEXOL 300 MG/ML  SOLN
100.0000 mL | Freq: Once | INTRAMUSCULAR | Status: AC | PRN
  Administered 2022-02-24: 100 mL via INTRAVENOUS

## 2022-03-03 ENCOUNTER — Other Ambulatory Visit: Payer: Self-pay | Admitting: Oncology

## 2022-03-03 ENCOUNTER — Inpatient Hospital Stay: Payer: Medicare PPO

## 2022-03-04 ENCOUNTER — Telehealth: Payer: Self-pay

## 2022-03-04 ENCOUNTER — Inpatient Hospital Stay: Payer: Medicare PPO | Attending: Oncology

## 2022-03-04 ENCOUNTER — Encounter: Payer: Self-pay | Admitting: Oncology

## 2022-03-04 ENCOUNTER — Inpatient Hospital Stay: Payer: Medicare PPO | Admitting: Oncology

## 2022-03-04 ENCOUNTER — Inpatient Hospital Stay: Payer: Medicare PPO

## 2022-03-04 VITALS — BP 112/56 | HR 45 | Temp 97.1°F | Ht 64.0 in | Wt 175.0 lb

## 2022-03-04 DIAGNOSIS — Z853 Personal history of malignant neoplasm of breast: Secondary | ICD-10-CM | POA: Insufficient documentation

## 2022-03-04 DIAGNOSIS — C7800 Secondary malignant neoplasm of unspecified lung: Secondary | ICD-10-CM | POA: Diagnosis present

## 2022-03-04 DIAGNOSIS — T451X5A Adverse effect of antineoplastic and immunosuppressive drugs, initial encounter: Secondary | ICD-10-CM

## 2022-03-04 DIAGNOSIS — C7951 Secondary malignant neoplasm of bone: Secondary | ICD-10-CM | POA: Diagnosis present

## 2022-03-04 DIAGNOSIS — C771 Secondary and unspecified malignant neoplasm of intrathoracic lymph nodes: Secondary | ICD-10-CM | POA: Diagnosis not present

## 2022-03-04 DIAGNOSIS — C7970 Secondary malignant neoplasm of unspecified adrenal gland: Secondary | ICD-10-CM | POA: Insufficient documentation

## 2022-03-04 DIAGNOSIS — J90 Pleural effusion, not elsewhere classified: Secondary | ICD-10-CM

## 2022-03-04 DIAGNOSIS — Z7983 Long term (current) use of bisphosphonates: Secondary | ICD-10-CM | POA: Insufficient documentation

## 2022-03-04 DIAGNOSIS — R0602 Shortness of breath: Secondary | ICD-10-CM

## 2022-03-04 DIAGNOSIS — G620 Drug-induced polyneuropathy: Secondary | ICD-10-CM

## 2022-03-04 DIAGNOSIS — J910 Malignant pleural effusion: Secondary | ICD-10-CM | POA: Insufficient documentation

## 2022-03-04 DIAGNOSIS — Z95828 Presence of other vascular implants and grafts: Secondary | ICD-10-CM | POA: Diagnosis not present

## 2022-03-04 DIAGNOSIS — C782 Secondary malignant neoplasm of pleura: Secondary | ICD-10-CM | POA: Diagnosis not present

## 2022-03-04 DIAGNOSIS — Z79899 Other long term (current) drug therapy: Secondary | ICD-10-CM | POA: Diagnosis not present

## 2022-03-04 DIAGNOSIS — C50919 Malignant neoplasm of unspecified site of unspecified female breast: Secondary | ICD-10-CM | POA: Diagnosis not present

## 2022-03-04 DIAGNOSIS — Z5111 Encounter for antineoplastic chemotherapy: Secondary | ICD-10-CM

## 2022-03-04 DIAGNOSIS — Z79811 Long term (current) use of aromatase inhibitors: Secondary | ICD-10-CM | POA: Insufficient documentation

## 2022-03-04 LAB — COMPREHENSIVE METABOLIC PANEL
ALT: 19 U/L (ref 0–44)
AST: 26 U/L (ref 15–41)
Albumin: 3.7 g/dL (ref 3.5–5.0)
Alkaline Phosphatase: 46 U/L (ref 38–126)
Anion gap: 5 (ref 5–15)
BUN: 18 mg/dL (ref 8–23)
CO2: 27 mmol/L (ref 22–32)
Calcium: 8.6 mg/dL — ABNORMAL LOW (ref 8.9–10.3)
Chloride: 102 mmol/L (ref 98–111)
Creatinine, Ser: 0.88 mg/dL (ref 0.44–1.00)
GFR, Estimated: >60 mL/min (ref >60)
Glucose, Bld: 106 mg/dL — ABNORMAL HIGH (ref 70–99)
Potassium: 3.7 mmol/L (ref 3.5–5.1)
Sodium: 134 mmol/L — ABNORMAL LOW (ref 135–145)
Total Bilirubin: 1.2 mg/dL (ref 0.3–1.2)
Total Protein: 6.7 g/dL (ref 6.5–8.1)

## 2022-03-04 LAB — CBC WITH DIFFERENTIAL/PLATELET
Abs Immature Granulocytes: 0.02 10*3/uL (ref 0.00–0.07)
Basophils Absolute: 0.1 10*3/uL (ref 0.0–0.1)
Basophils Relative: 1 %
Eosinophils Absolute: 0.1 10*3/uL (ref 0.0–0.5)
Eosinophils Relative: 2 %
HCT: 35.2 % — ABNORMAL LOW (ref 36.0–46.0)
Hemoglobin: 11.9 g/dL — ABNORMAL LOW (ref 12.0–15.0)
Immature Granulocytes: 1 %
Lymphocytes Relative: 16 %
Lymphs Abs: 0.7 10*3/uL (ref 0.7–4.0)
MCH: 32.8 pg (ref 26.0–34.0)
MCHC: 33.8 g/dL (ref 30.0–36.0)
MCV: 97 fL (ref 80.0–100.0)
Monocytes Absolute: 0.4 10*3/uL (ref 0.1–1.0)
Monocytes Relative: 10 %
Neutro Abs: 3 10*3/uL (ref 1.7–7.7)
Neutrophils Relative %: 70 %
Platelets: 218 10*3/uL (ref 150–400)
RBC: 3.63 MIL/uL — ABNORMAL LOW (ref 3.87–5.11)
RDW: 14.6 % (ref 11.5–15.5)
WBC: 4.3 10*3/uL (ref 4.0–10.5)
nRBC: 0 % (ref 0.0–0.2)

## 2022-03-04 MED ORDER — SODIUM CHLORIDE 0.9 % IV SOLN
Freq: Once | INTRAVENOUS | Status: AC
  Filled 2022-03-04: qty 250

## 2022-03-04 MED ORDER — ZOLEDRONIC ACID 4 MG/100ML IV SOLN
4.0000 mg | Freq: Once | INTRAVENOUS | Status: AC
  Administered 2022-03-04: 4 mg via INTRAVENOUS
  Filled 2022-03-04: qty 100

## 2022-03-04 MED ORDER — HEPARIN SOD (PORK) LOCK FLUSH 100 UNIT/ML IV SOLN
500.0000 [IU] | Freq: Once | INTRAVENOUS | Status: AC | PRN
  Administered 2022-03-04: 500 [IU]
  Filled 2022-03-04: qty 5

## 2022-03-04 NOTE — Progress Notes (Signed)
Hematology/Oncology Progress note [phone] [phone]      Patient Care Team: Gayland Curry, MD as PCP - General (Family Medicine) Noreene Filbert, MD as Consulting Physician (Radiation Oncology)  REFERRING PROVIDER: Gayland Curry, MD  CHIEF COMPLAINTS/REASON FOR VISIT:  metastatic breast caner  HISTORY OF PRESENTING ILLNESS:   Amber Blevins is a  71 y.o.  female with PMH listed below was seen in consultation at the request of  Gayland Curry, MD  for evaluation of tumor in lumbar vertebrae  # patient reports feeling tightness and pain in Feb 2022. She was started on conservative management which did not help her symptoms. She also started to experience numbness.  She takes gabapentin which partially relieve the discomfort. She denies bowel or bladder incontinence, no lower extremity weakness.   06/25/2021 MRI lumbar spine IMPRESSION:  1. 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 Additional metastatic involvement T12, L1 through L3.  No primary tumor site identified. Top differential considerations are Metastatic Disease Unknown primary, less likely Lymphoma or Multiple Myeloma.   2. Superimposed lumbar spine degeneration, including degenerative moderate to severe left L3 and L4 nerve level impingement from disc herniation.    Patient was seen by neurosurgeon Dr.Yarbrough today. He has ordered MRI cervical and thoracic spine and sacrum biopsy for further work up.   Patient has a history of breast cancer, diagnosed in 2010 Left-sided T2 (2.4cm) N0, ER/PR positive, HER2 negative IDC of the breast, s/p lumpectomy by Dr.Meyer at [facility]. Oncotype score of 11 who completed radiation and she finished 10  years of Femara from 05/2009 and stopped in 2020.  she has intentionally lost some weight, no night sweating, fever.   # 07/02/2021, CT chest abdomen pelvis showed innumerable small pulmonary and pleural nodules consistent with diffuse metastasis.  Associated with probable malignant pleural effusion.  Mediastinal and hilar lymphadenopathy consistent with metastatic disease.  Hepatic and bilateral adrenal gland metastasis.  Diffuse extensive destructive metastatic bone disease involving pelvis.   07/11/2021 - 07/14/2021  Patient was admitted to hospital due to shortness of breath. 07/12/2021, CT chest PE protocol showed no PE.  Further increase of March pleural effusion and admit to moderate interstitial pulmonary edema.  Bulky mediastinal and hilar lymphadenopathy.  Unchanged.  Incompletely visualized left adrenal nodule. 07/12/2021 patient underwent right thoracentesis.  Cytology was positive for metastatic carcinoma, compatible with breast origin.  ER/PR +, HER2 negative   07/16/2021, patient underwent iliac bone biopsy.  Positive for metastatic carcinoma. 07/25/2021, patient underwent thoracentesis of right side, removed 550 cc straw-colored pleural fluid.  She tolerated procedure well.  09/04/2021, CT chest abdomen pelvis showed improvement of the pulmonary nodularity and thickening in the left and the right lung.  Persistent nodular interstitial and pleural thickening remains.  Improvement of mediastinal and hilar adenopathy, hepatic metastatic lesions are similar.  Adrenal gland metastasis is similar.  Interval increase in the sclerotic bone metastasis.  Discussed with patient that I would like to proceed with 2 additional cycles of Taxol.  09/09/2021 Taxol 09/16/2021 Taxol  09/30/2021, started on abemaciclib 100 mg twice daily and letrozole. 11/25/2021 CT chest abdomen pelvis with contrast - partial response.  Interval decrease in size of mediastinal lymph nodes and increased in  density.Stable pleural thickening along the oblique fissures and stable interstitial thickening in  the lingula and middle lobe. No measurable pulmonary nodularity. Increased peribronchial thickening medial RIGHT upper lobe is favored benign. Stable sclerotic metastasis in the thoracic spine and sternum Interval decrease in size of hypoenhancing lesion in the liver. No new hepatic lesions. Stable LEFT adrenal metastasis. No metastatic adenopathy in the abdomen pelvis. Stable sclerotic metastasis in the pelvis in lumbar spine.  INTERVAL HISTORY Amber Blevins is a 71 y.o. female who has above history reviewed by me today presents for follow up visit for management of metastatic breast cancer . Patient reports feeling more shortness of breath specially with exertion. Otherwise no new complaints. 02/24/2022, CT chest abdomen pelvis with contrast showed unchanged bilateral pleural effusion with associated intralobular septal thickening of the lung bases.  Without discrete nodularity consistent with stable appearance of treated pleural and lymphangitic metastatic disease.  Stable or minimally diminished subcentimeter liver lesions.  Unchanged left adrenal metastasis.  Unchanged widespread sclerotic osseous metastatic disease involving the included axial and appendicular skeleton.  No evidence of new metastatic disease in the chest abdomen or pelvis.  Coronary artery disease.    Review of Systems  Constitutional:  Negative for appetite change, chills, fatigue and fever.  HENT:   Negative for hearing loss and voice change.   Eyes:  Negative for eye problems.  Respiratory:  Positive for shortness of breath. Negative for chest tightness and cough.   Cardiovascular:  Negative for chest pain.  Gastrointestinal:  Negative for abdominal distention, abdominal pain, blood in stool and diarrhea.  Endocrine: Negative for hot flashes.  Genitourinary:  Negative for difficulty urinating and frequency.    Musculoskeletal:  Negative for arthralgias and back pain.  Skin:  Negative for itching and rash.  Neurological:  Positive for numbness. Negative for extremity weakness.  Hematological:  Negative for adenopathy.  Psychiatric/Behavioral:  Positive for sleep disturbance. Negative for confusion.    MEDICAL HISTORY:  Past Medical History:  Diagnosis Date   Breast cancer ([place])    COPD (chronic obstructive pulmonary disease) ([place])    Family history of adverse reaction to anesthesia    brother has problem with coming out of anesthesia   High cholesterol    Hypertension    Personal history of radiation therapy    Pre-diabetes     SURGICAL HISTORY: Past Surgical History:  Procedure Laterality Date   BREAST LUMPECTOMY Left    2010   COLONOSCOPY     EYE SURGERY Left    Cataract surgery   PORTACATH PLACEMENT N/A 08/01/2021   Procedure: INSERTION PORT-A-CATH;  Surgeon: Herbert Pun, MD;  Location: [facility] ORS;  Service: General;  Laterality: N/A;   thorocentesis Right 07/10/2021   and another on 07/25/21    SOCIAL HISTORY: Social History   Socioeconomic History   Marital status: Married    Spouse name: Not on file   Number of children: Not on file   Years of education: Not on file   Highest education level: Not on file  Occupational History   Not on file  Tobacco Use   Smoking status: Former    Packs/day: 1.00    Years: 25.00    Pack years: 25.00    Types: Cigarettes    Quit date: 1990    Years since quitting: 33.4   Smokeless tobacco: Never  Vaping Use   Vaping Use: Never used  Substance and Sexual Activity   Alcohol use: Not Currently   Drug use: Never   Sexual activity: Not on file  Other Topics Concern  Not on file  Social History Narrative   Not on file   Social Determinants of Health   Financial Resource Strain: Not on file  Food Insecurity: Not on file  Transportation Needs: Not on file  Physical Activity: Not on file  Stress: Not on file   Social Connections: Not on file  Intimate Partner Violence: Not on file    FAMILY HISTORY: Family History  Problem Relation Age of Onset   Cancer Mother        gynecological   Lung cancer Mother    Diabetes Father    Heart disease Father    Parkinson's disease Father    Brain cancer Brother    Bladder Cancer Brother    Pulmonary disease Brother    Rheumatic fever Brother    Breast cancer Neg Hx     ALLERGIES:  is allergic to green tea (camellia sinensis), melaleuca viridiflora, tea tree oil, and lisinopril.  MEDICATIONS:  Current Outpatient Medications  Medication Sig Dispense Refill   abemaciclib (VERZENIO) 100 MG tablet Take 1 tablet (100 mg total) by mouth 2 (two) times daily. Swallow tablets whole. Do not chew, crush, or split tablets before swallowing. 60 tablet 3   acetaminophen (TYLENOL) 650 MG CR tablet Take 650 mg by mouth every 8 (eight) hours as needed for pain.     aspirin 81 MG EC tablet Take by mouth.     atorvastatin (LIPITOR) 40 MG tablet Take 40 mg by mouth at bedtime.     Boswellia-Glucosamine-Vit D (OSTEO BI-FLEX-GLUCOS/5-LOXIN) TABS Take 1 capsule by mouth in the morning and at bedtime.     Calcium Carb-Cholecalciferol 600-400 MG-UNIT TABS Take 1 tablet by mouth daily.     diphenoxylate-atropine (LOMOTIL) 2.5-0.025 MG tablet Take 1 tablet by mouth 4 (four) times daily as needed for diarrhea or loose stools. 30 tablet 0   Docusate Sodium (DSS) 100 MG CAPS Take 1 capsule by mouth every other day.     gabapentin (NEURONTIN) 300 MG capsule TAKE 2 CAPSULES BY MOUTH THREE TIMES DAILY 180 capsule 0   ibuprofen (ADVIL) 200 MG tablet Take by mouth. Take 400 mg by mouth every six (6) hours as needed for pain.     letrozole (FEMARA) 2.5 MG tablet Take 1 tablet (2.5 mg total) by mouth daily. 90 tablet 2   lidocaine-prilocaine (EMLA) cream Apply 1 application topically as needed. 30 g 5   Loperamide HCl (IMODIUM PO) Take by mouth.     LORazepam (ATIVAN) 0.5 MG tablet  TAKE 1 TABLET BY MOUTH AT BEDTIME AS NEEDED FOR ANXIETY 30 tablet 0   magnesium chloride (SLOW-MAG) 64 MG TBEC SR tablet Take 1 tablet by mouth at bedtime.     ondansetron (ZOFRAN) 8 MG tablet Take 1 tablet (8 mg total) by mouth 2 (two) times daily as needed (Nausea or vomiting). 30 tablet 1   oxyCODONE (OXY IR/ROXICODONE) 5 MG immediate release tablet Take 1 tablet (5 mg total) by mouth every 12 (twelve) hours as needed for severe pain or moderate pain. 60 tablet 0   potassium chloride SA (KLOR-CON M) 20 MEQ tablet Take 1 tablet (20 mEq total) by mouth daily. 30 tablet 0   prochlorperazine (COMPAZINE) 10 MG tablet Take 1 tablet (10 mg total) by mouth every 6 (six) hours as needed (Nausea or vomiting). 30 tablet 1   sucralfate (CARAFATE) 1 GM/10ML suspension Take 10 mLs (1 g total) by mouth 3 (three) times daily. 420 mL 2   Turmeric (QC TUMERIC  COMPLEX PO) Take 1 capsule by mouth at bedtime.     albuterol (VENTOLIN HFA) 108 (90 Base) MCG/ACT inhaler Inhale 2 puffs into the lungs every 6 (six) hours as needed. (Patient not taking: Reported on 12/02/2021)     losartan (COZAAR) 50 MG tablet Take 50 mg by mouth daily.     No current facility-administered medications for this visit.     PHYSICAL EXAMINATION: ECOG PERFORMANCE STATUS: 1 - Symptomatic but completely ambulatory Vitals:   03/04/22 0944  BP: (!) 112/56  Pulse: (!) 45  Temp: (!) 97.1 F (36.2 C)   Filed Weights   03/04/22 0944  Weight: 175 lb (79.4 kg)    Physical Exam Constitutional:      General: She is not in acute distress. HENT:     Head: Normocephalic and atraumatic.  Eyes:     General: No scleral icterus. Cardiovascular:     Rate and Rhythm: Normal rate and regular rhythm.     Heart sounds: Normal heart sounds.  Pulmonary:     Effort: Pulmonary effort is normal. No respiratory distress.     Breath sounds: No wheezing.     Comments: Decreased breath sound bibasilar Abdominal:     General: Bowel sounds are normal.  There is no distension.     Palpations: Abdomen is soft.  Musculoskeletal:        General: No deformity. Normal range of motion.     Cervical back: Normal range of motion and neck supple.  Skin:    General: Skin is warm and dry.     Findings: No erythema or rash.  Neurological:     Mental Status: She is alert and oriented to person, place, and time. Mental status is at baseline.     Cranial Nerves: No cranial nerve deficit.     Coordination: Coordination normal.  Psychiatric:        Mood and Affect: Mood normal.    LABORATORY DATA:  I have reviewed the data as listed Lab Results  Component Value Date   WBC 4.3 03/04/2022   HGB 11.9 (L) 03/04/2022   HCT 35.2 (L) 03/04/2022   MCV 97.0 03/04/2022   PLT 218 03/04/2022   Recent Labs    01/06/22 0935 02/04/22 0952 03/04/22 0930  NA 137 136 134*  K 3.5 3.5 3.7  CL 103 102 102  CO2 _0 GLUCOSE 120* 108* 106*  BUN _1 CREATININE 0.92 0.84 0.88  CALCIUM 9.0 9.2 8.6*  GFRNONAA >60 >60 >60  PROT 6.7 6.7 6.7  ALBUMIN 3.8 3.8 3.7  AST _2 ALT _3 ALKPHOS 50 48 46  BILITOT 1.0 1.4* 1.2    Iron/TIBC/Ferritin/ %Sat No results found for: IRON, TIBC, FERRITIN, IRONPCTSAT    RADIOGRAPHIC STUDIES: I have personally reviewed the radiological images as listed and agreed with the findings in the report. CT CHEST ABDOMEN PELVIS W CONTRAST  Result Date: 02/24/2022 CLINICAL DATA:  Metastatic breast cancer restaging, ongoing chemotherapy * Tracking Code: BO * EXAM: CT CHEST, ABDOMEN, AND PELVIS WITH CONTRAST TECHNIQUE: Multidetector CT imaging of the chest, abdomen and pelvis was performed following the standard protocol during bolus administration of intravenous contrast. RADIATION DOSE REDUCTION: This exam was performed according to the departmental dose-optimization program which includes automated exposure control, adjustment of the mA and/or kV according to patient size and/or use of iterative reconstruction  technique. CONTRAST:  176m OMNIPAQUE IOHEXOL 300 MG/ML SOLN, additional oral enteric contrast  COMPARISON:  11/24/2021 FINDINGS: CT CHEST FINDINGS Cardiovascular: Right chest port catheter. Aortic atherosclerosis. Normal heart size. Left coronary artery calcifications. Trace pericardial effusion. Mediastinum/Nodes: No enlarged mediastinal, hilar, or axillary lymph nodes. Thyroid gland, trachea, and esophagus demonstrate no significant findings. Lungs/Pleura: Moderate right, small left pleural effusions, similar to prior examination. No pleural nodularity or thickening noted. Mild interlobular septal thickening predominantly involving the unchanged minimal bilateral paramedian radiation fibrosis (series 6, image 56). Musculoskeletal: Skin thickening of the left breast. CT ABDOMEN PELVIS FINDINGS Hepatobiliary: Stable or minimally diminished subcentimeter liver lesions, for example a lesion measuring 0.8 cm, previously 1.0 cm inferior right lobe of the liver, hepatic segment V/VI (series 2, image 61), and an unchanged lesion of the anterior liver dome, hepatic segment VIII, measuring 0.8 cm (series 2, image 47). No gallstones, gallbladder wall thickening, or biliary dilatation. Pancreas: Unremarkable. No pancreatic ductal dilatation or surrounding inflammatory changes. Spleen: Normal in size without significant abnormality. Adrenals/Urinary Tract: Unchanged left adrenal nodule measuring 1.8 x 1.2 cm (series 2, image 54). Kidneys are normal, without renal calculi, solid lesion, or hydronephrosis. Bladder is unremarkable. Stomach/Bowel: Stomach is within normal limits. Appendix appears normal. No evidence of bowel wall thickening, distention, or inflammatory changes. Descending and sigmoid diverticulosis. Vascular/Lymphatic: Aortic atherosclerosis. No enlarged abdominal or pelvic lymph nodes. Reproductive: No mass or other abnormality. Other: No abdominal wall hernia or abnormality. No ascites. Musculoskeletal: No acute  osseous findings. Unchanged widespread sclerotic osseous metastatic disease involving the included axial and appendicular skeleton. IMPRESSION: 1. Unchanged bilateral pleural effusions with associated interlobular septal thickening of the lung bases, however without discrete nodularity, consistent with stable appearance of treated pleural and lymphangitic metastatic disease. 2. Stable or minimally diminished subcentimeter liver lesions, consistent with stable or slightly improved hepatic metastatic disease. 3. Unchanged left adrenal metastasis. Previously noted right adrenal metastasis is not appreciated. 4. Unchanged widespread sclerotic osseous metastatic disease involving the included axial and appendicular skeleton. 5. No evidence of new metastatic disease in the chest, abdomen or pelvis. 6. Coronary artery disease. Aortic Atherosclerosis (ICD10-I70.0). Electronically Signed   By: Delanna Ahmadi M.D.   On: 02/24/2022 15:10      ASSESSMENT & PLAN:  1. Primary malignant neoplasm of breast with metastasis ([place])   2. Encounter for antineoplastic chemotherapy   3. 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 Port-A-Cath in place   6. Bilateral pleural effusion   7. SOB (shortness of breath)    # Metastatic Breast cancer with extensive thoracic and bone involvement, in visceral crisis, ER+, PR+ HER2 neg CT scan was independently reviewed by me and discussed with Patient.  Stable disease. Labs reviewed and discussed with patient. Recommend patient to continue abemaciclib 100 mg twice daily plus letrozole daily.  #Bilateral pleural effusion, moderate on the right side and small on the left side.  Unchanged on recent CT scan.  Subjectively, patient reports feeling more shortness of breath with exertion.  We will schedule patient to get ultrasound-guided right thoracentesis.   #Grade 2 neuropathy, continue gabapentin,  Continue gabapentin 300 mg in a.m., 900 mg at  bedtime.  #Extensive bone metastasis.  Status post palliative radiation to lumbar/sacrum Status post radiation to thoracic spine and cervical spine. Zometa Q3 months.-Proceed with Zometa today.  #Anxiety, continue low-dose Ativan 0.5 mg QHS PRN, she currently has not been utilizing due to improved symptoms.  #Port-A-Cath in place, recommend port flush every 8 weeks.  Patient prefer for labs to be drawn from port  in the future.  All questions were answered. The patient knows to call the clinic with any problems questions or concerns.  cc Gayland Curry, MD    Follow up   Follow-up in 4 weeks, lab MD  treatments.   [name], MD, PhD  03/04/2022

## 2022-03-04 NOTE — Telephone Encounter (Signed)
Patient notified of Thoracentesis scheduled for Friday 6/9 @ 1 pm patient to arrive at 12:30 pm. Patient verbalized understanding.

## 2022-03-04 NOTE — Telephone Encounter (Signed)
US thoracentesis (right) order and form faxed to IR for scheduling. Will notify patient of appt once scheduled.

## 2022-03-04 NOTE — Progress Notes (Signed)
Calcium 8.6. Per Dr. Tasia Catchings, ok to proceed with zometa today.

## 2022-03-05 ENCOUNTER — Other Ambulatory Visit: Payer: Self-pay

## 2022-03-05 ENCOUNTER — Encounter: Payer: Self-pay | Admitting: Oncology

## 2022-03-05 LAB — CANCER ANTIGEN 27.29: CA 27.29: 42.1 U/mL — ABNORMAL HIGH (ref 0.0–38.6)

## 2022-03-05 LAB — CANCER ANTIGEN 15-3: CA 15-3: 35.3 U/mL — ABNORMAL HIGH (ref 0.0–25.0)

## 2022-03-05 MED ORDER — LIDOCAINE-PRILOCAINE 2.5-2.5 % EX CREA: 1.0000 "application " | TOPICAL_CREAM | CUTANEOUS | 5 refills | Status: DC | PRN

## 2022-03-06 ENCOUNTER — Other Ambulatory Visit (HOSPITAL_COMMUNITY): Payer: Self-pay

## 2022-03-06 ENCOUNTER — Ambulatory Visit
Admission: RE | Admit: 2022-03-06 | Discharge: 2022-03-06 | Disposition: A | Discharge status: Home / Self Care | Payer: Medicare PPO | Source: Ambulatory Visit | Attending: Oncology | Admitting: Oncology

## 2022-03-06 ENCOUNTER — Other Ambulatory Visit: Payer: Self-pay | Admitting: Internal Medicine

## 2022-03-06 ENCOUNTER — Ambulatory Visit
Admission: RE | Admit: 2022-03-06 | Discharge: 2022-03-06 | Disposition: A | Discharge status: Home / Self Care | Payer: Medicare PPO | Source: Ambulatory Visit | Attending: Internal Medicine | Admitting: Internal Medicine

## 2022-03-06 DIAGNOSIS — R0600 Dyspnea, unspecified: Secondary | ICD-10-CM | POA: Diagnosis not present

## 2022-03-06 DIAGNOSIS — Z9889 Other specified postprocedural states: Secondary | ICD-10-CM

## 2022-03-06 DIAGNOSIS — R0602 Shortness of breath: Secondary | ICD-10-CM

## 2022-03-06 DIAGNOSIS — Z853 Personal history of malignant neoplasm of breast: Secondary | ICD-10-CM | POA: Insufficient documentation

## 2022-03-06 IMAGING — US US THORACENTESIS ASP PLEURAL SPACE W/IMG GUIDE
1 series · 5 of 5 positions shown · non-contrast
Comparison: none

INDICATION: Patient with history of metastatic breast cancer complaining of
dyspnea. Request for therapeutic right thoracentesis.

[Series 1: us thoracentesis asp pleural space w/img guide · 5 of 5 slices shown]
[im 1/5]
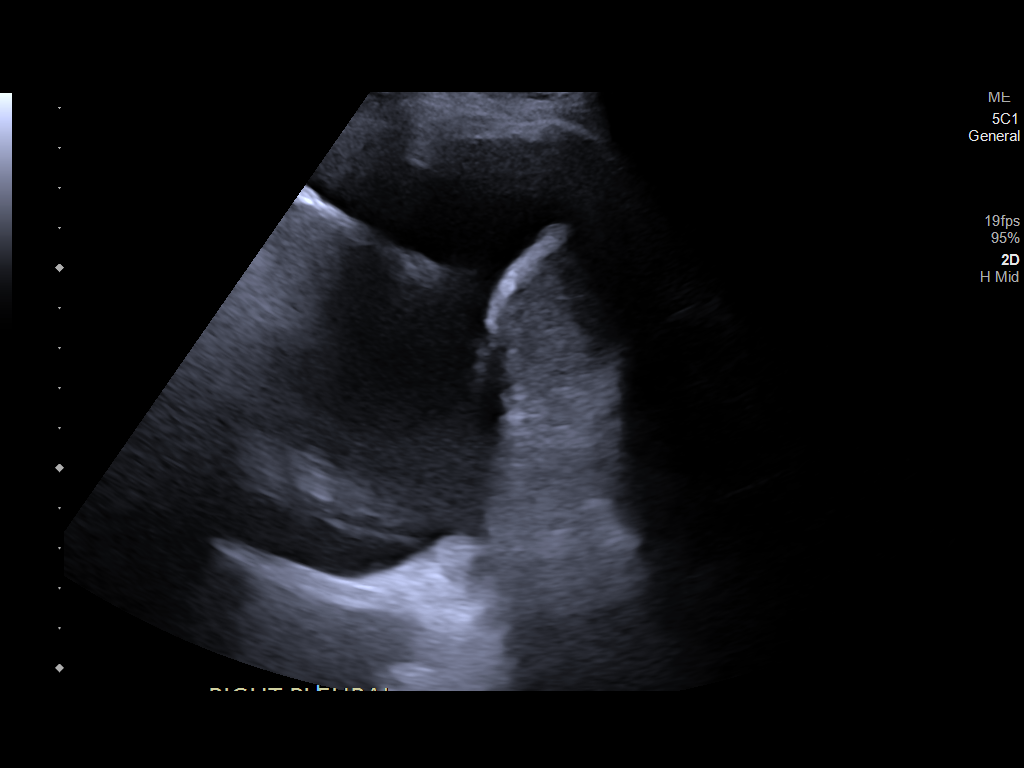
[im 2/5]
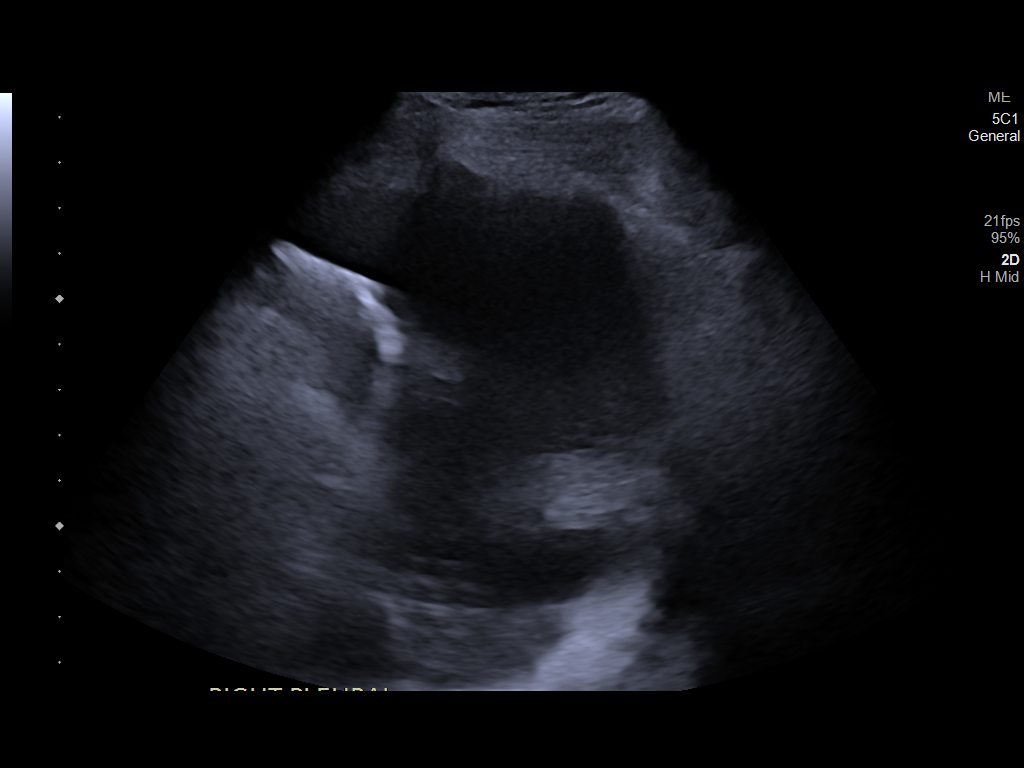
[im 3/5]
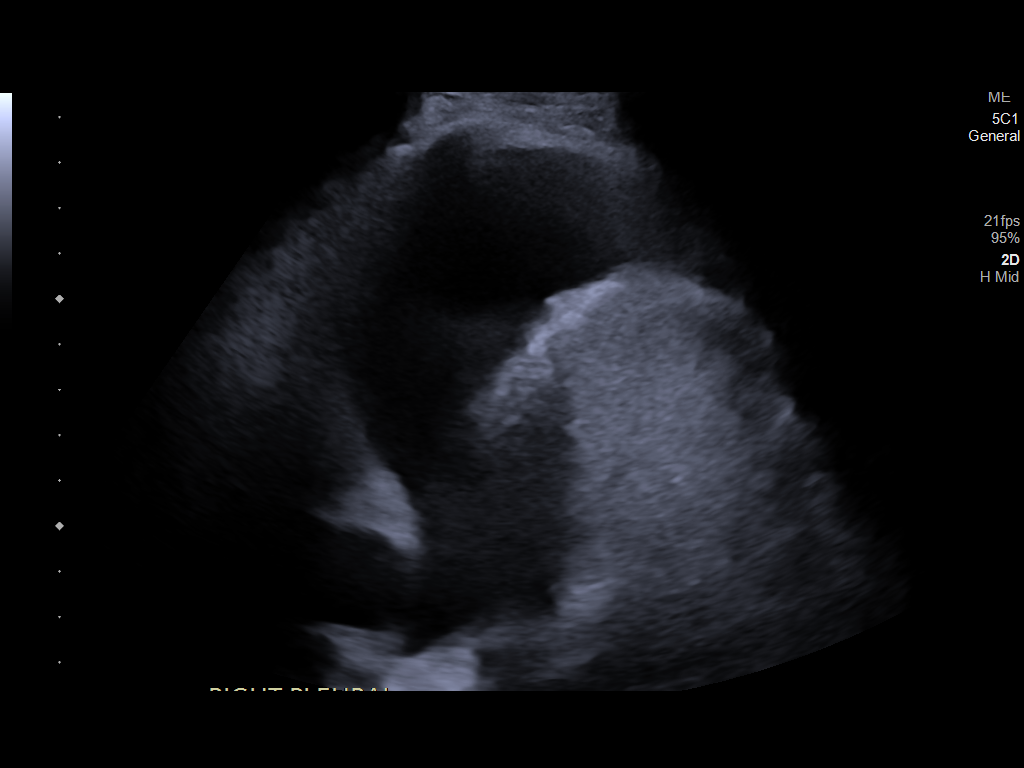
[im 4/5]
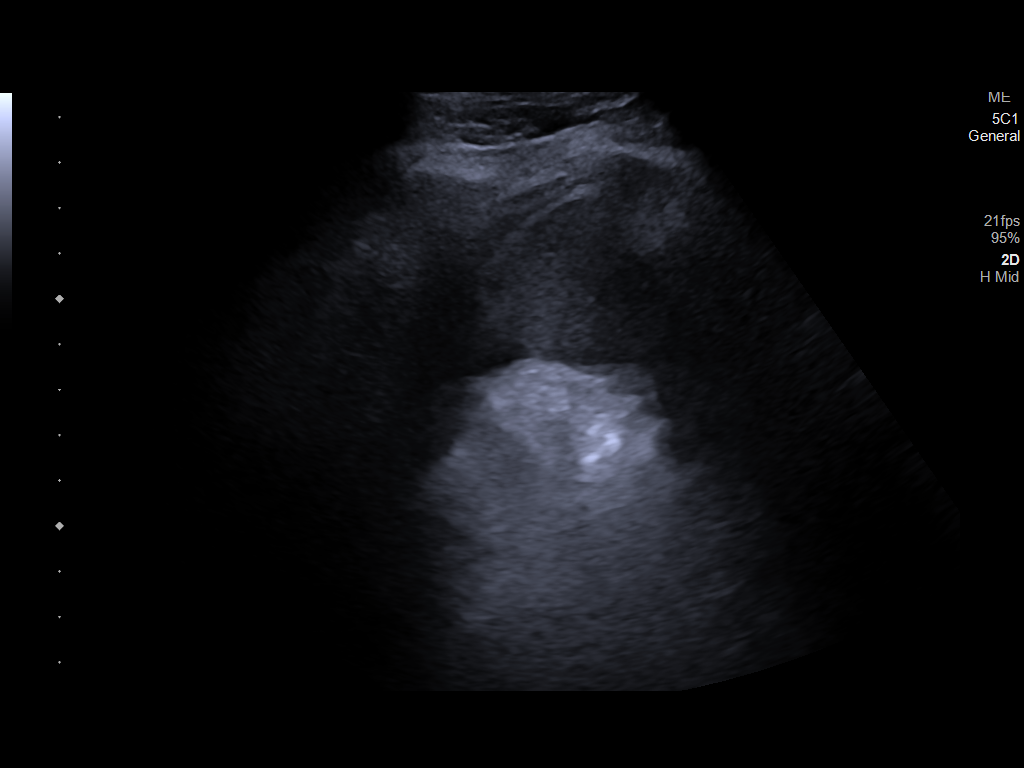
[im 5/5]
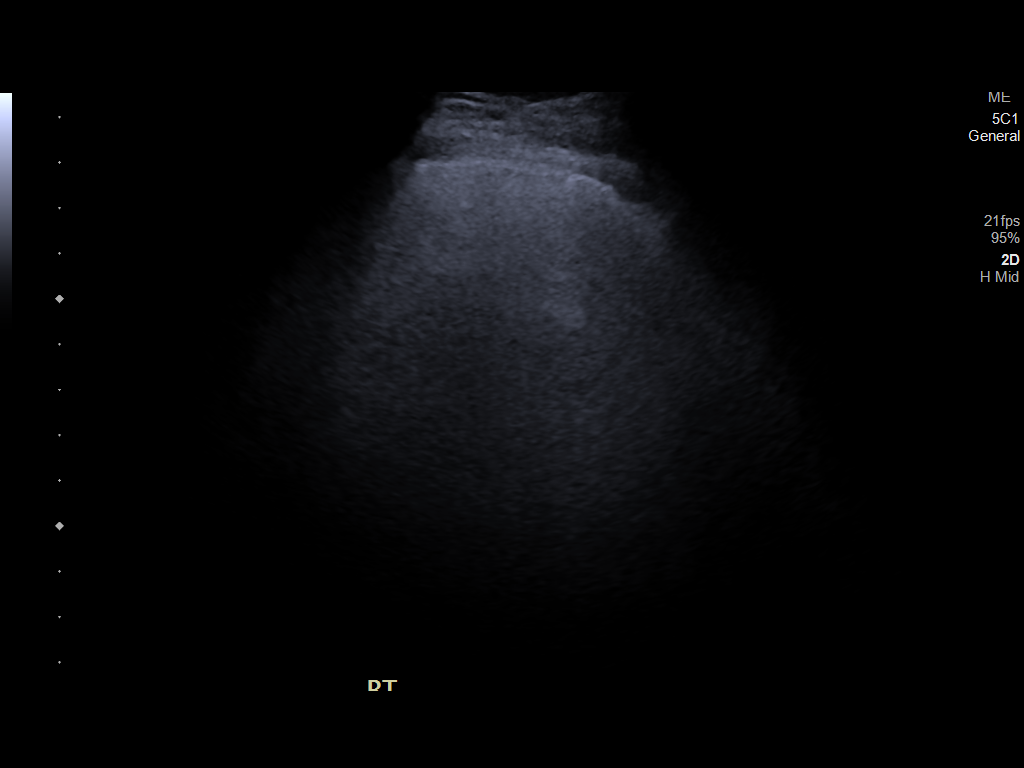

[5 of 5 positions shown; findings below may reference images not displayed]

EXAM:
ULTRASOUND GUIDED THERAPEUTIC RIGHT THORACENTESIS

MEDICATIONS:
10 mL 1 % lidocaine

COMPLICATIONS:
None

PROCEDURE:
An ultrasound guided thoracentesis was thoroughly discussed with the
patient and questions answered. The benefits, risks, alternatives
and complications were also discussed. The patient understands and
wishes to proceed with the procedure. Written consent was obtained.

Ultrasound was performed to localize and mark an adequate pocket of
fluid in the right chest. The area was then prepped and draped in
the normal sterile fashion. 1% Lidocaine was used for local
anesthesia. Under ultrasound guidance a 6 Fr Safe-T-Centesis
catheter was introduced. Thoracentesis was performed. The catheter
was removed and a dressing applied.
FINDINGS: A total of approximately 800 mL of clear, yellow fluid was removed.
IMPRESSION: Successful ultrasound guided right thoracentesis yielding 800 mL of
pleural fluid.

## 2022-03-06 IMAGING — DX DG CHEST 1V PORT
1 series · 1 of 1 positions shown · non-contrast
Comparison: [DATE]

CLINICAL DATA: Status post thoracentesis

EXAM:
PORTABLE CHEST 1 VIEW

[chest ap]
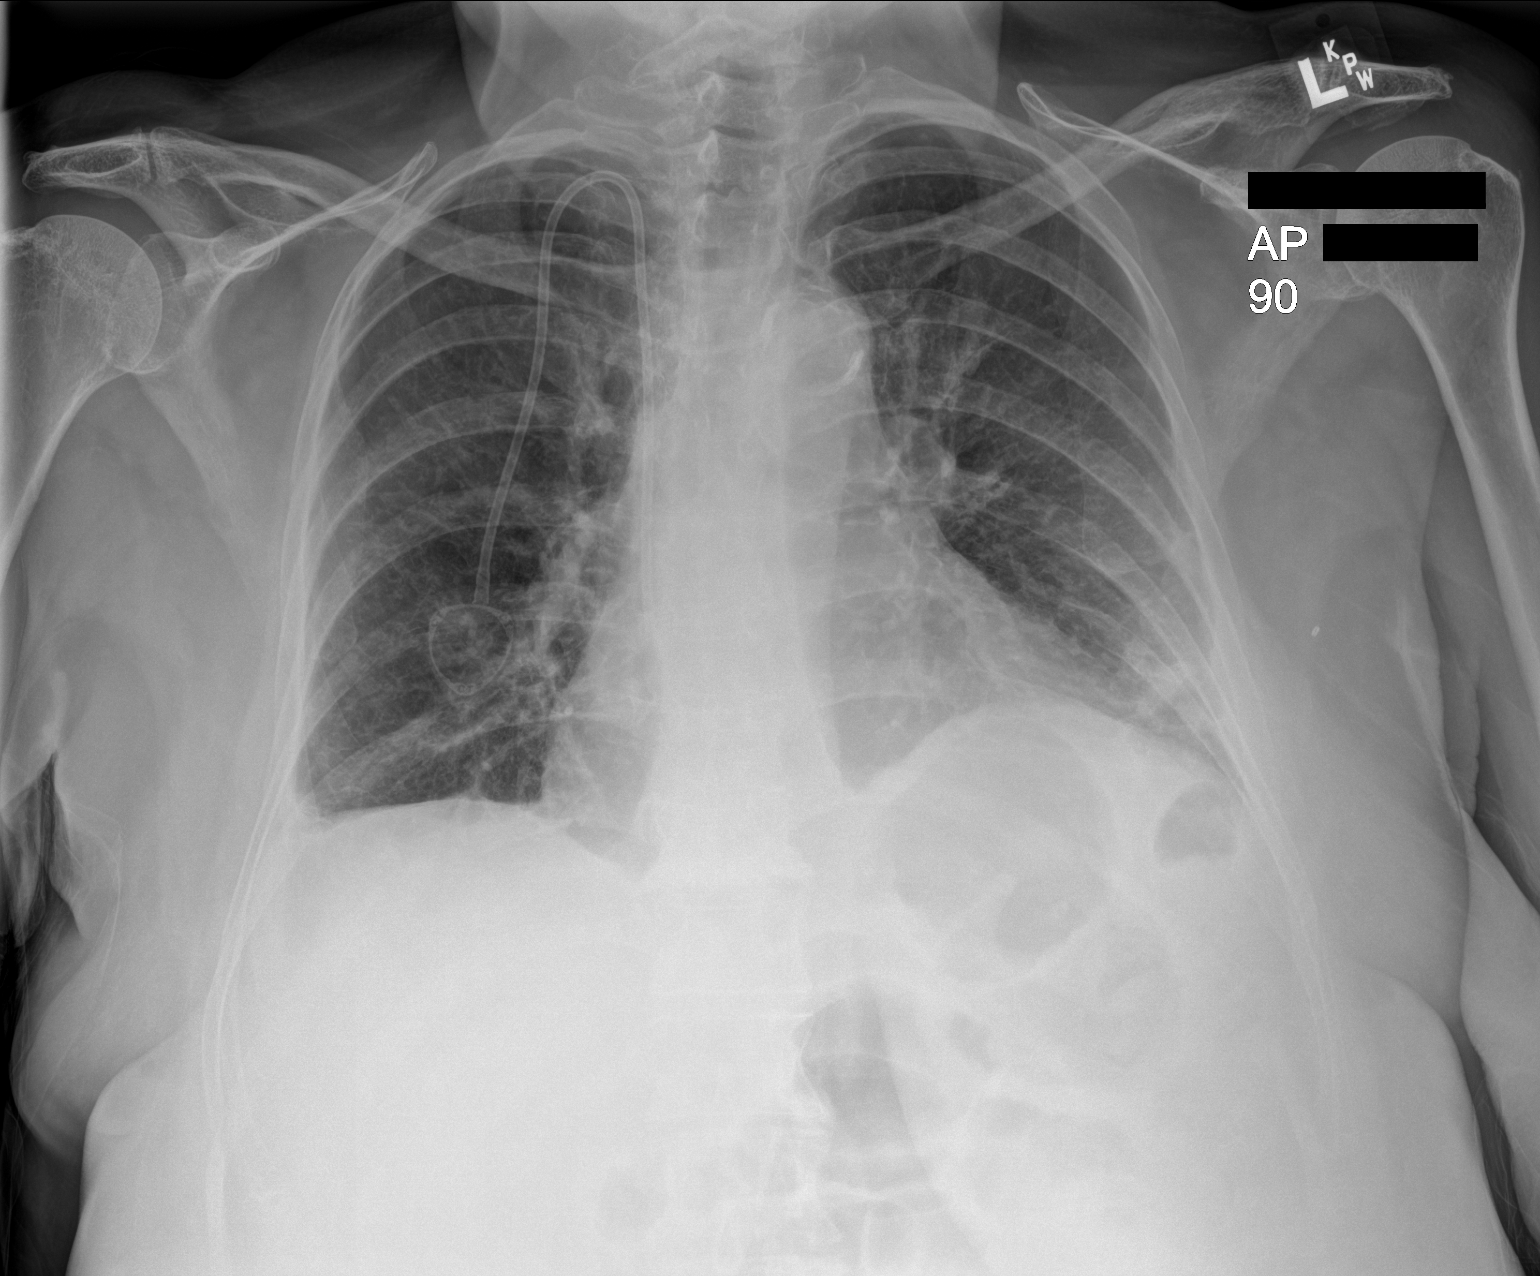

[1 of 1 positions shown; findings below may reference images not displayed]

FINDINGS: Port in the anterior chest wall with tip in distal SVC.

Reduction in RIGHT pleural fluid. No pneumothorax. Mild LEFT basilar
atelectasis.
IMPRESSION: No pneumothorax.

## 2022-03-06 NOTE — Procedures (Signed)
PROCEDURE SUMMARY:  Successful US guided therapeutic right thoracentesis. Yielded 800 ml of clear, yellow fluid. Pt tolerated procedure well. No immediate complications.  Specimen not sent for labs. CXR ordered.  EBL < 1 mL  Tyson Alias, AGNP 03/06/2022 2:02 PM

## 2022-03-08 ENCOUNTER — Encounter: Payer: Self-pay | Admitting: Oncology

## 2022-03-08 DIAGNOSIS — C801 Malignant (primary) neoplasm, unspecified: Secondary | ICD-10-CM | POA: Insufficient documentation

## 2022-03-10 ENCOUNTER — Other Ambulatory Visit (HOSPITAL_COMMUNITY): Payer: Self-pay

## 2022-03-14 ENCOUNTER — Other Ambulatory Visit: Payer: Self-pay | Admitting: Nurse Practitioner

## 2022-04-03 ENCOUNTER — Encounter: Payer: Self-pay | Admitting: Oncology

## 2022-04-03 ENCOUNTER — Inpatient Hospital Stay: Payer: Medicare PPO | Attending: Oncology | Admitting: Oncology

## 2022-04-03 ENCOUNTER — Inpatient Hospital Stay: Payer: Medicare PPO

## 2022-04-03 VITALS — BP 145/73 | HR 84 | Temp 97.1°F | Wt 173.0 lb

## 2022-04-03 DIAGNOSIS — J90 Pleural effusion, not elsewhere classified: Secondary | ICD-10-CM | POA: Diagnosis not present

## 2022-04-03 DIAGNOSIS — Z79899 Other long term (current) drug therapy: Secondary | ICD-10-CM | POA: Insufficient documentation

## 2022-04-03 DIAGNOSIS — G893 Neoplasm related pain (acute) (chronic): Secondary | ICD-10-CM

## 2022-04-03 DIAGNOSIS — Z87891 Personal history of nicotine dependence: Secondary | ICD-10-CM | POA: Insufficient documentation

## 2022-04-03 DIAGNOSIS — C7971 Secondary malignant neoplasm of right adrenal gland: Secondary | ICD-10-CM | POA: Insufficient documentation

## 2022-04-03 DIAGNOSIS — Z923 Personal history of irradiation: Secondary | ICD-10-CM | POA: Diagnosis not present

## 2022-04-03 DIAGNOSIS — C7800 Secondary malignant neoplasm of unspecified lung: Secondary | ICD-10-CM | POA: Insufficient documentation

## 2022-04-03 DIAGNOSIS — C7951 Secondary malignant neoplasm of bone: Secondary | ICD-10-CM | POA: Diagnosis present

## 2022-04-03 DIAGNOSIS — T451X5A Adverse effect of antineoplastic and immunosuppressive drugs, initial encounter: Secondary | ICD-10-CM

## 2022-04-03 DIAGNOSIS — C50919 Malignant neoplasm of unspecified site of unspecified female breast: Secondary | ICD-10-CM | POA: Diagnosis not present

## 2022-04-03 DIAGNOSIS — Z95828 Presence of other vascular implants and grafts: Secondary | ICD-10-CM

## 2022-04-03 DIAGNOSIS — C787 Secondary malignant neoplasm of liver and intrahepatic bile duct: Secondary | ICD-10-CM | POA: Diagnosis not present

## 2022-04-03 DIAGNOSIS — Z5111 Encounter for antineoplastic chemotherapy: Secondary | ICD-10-CM

## 2022-04-03 DIAGNOSIS — G620 Drug-induced polyneuropathy: Secondary | ICD-10-CM | POA: Diagnosis not present

## 2022-04-03 DIAGNOSIS — C7972 Secondary malignant neoplasm of left adrenal gland: Secondary | ICD-10-CM | POA: Diagnosis not present

## 2022-04-03 DIAGNOSIS — Z79811 Long term (current) use of aromatase inhibitors: Secondary | ICD-10-CM | POA: Insufficient documentation

## 2022-04-03 DIAGNOSIS — Z170 Estrogen receptor positive status [ER+]: Secondary | ICD-10-CM | POA: Insufficient documentation

## 2022-04-03 DIAGNOSIS — C801 Malignant (primary) neoplasm, unspecified: Secondary | ICD-10-CM

## 2022-04-03 DIAGNOSIS — F411 Generalized anxiety disorder: Secondary | ICD-10-CM

## 2022-04-03 LAB — CBC WITH DIFFERENTIAL/PLATELET
Abs Immature Granulocytes: 0.02 10*3/uL (ref 0.00–0.07)
Basophils Absolute: 0 10*3/uL (ref 0.0–0.1)
Basophils Relative: 1 %
Eosinophils Absolute: 0.1 10*3/uL (ref 0.0–0.5)
Eosinophils Relative: 2 %
HCT: 37.3 % (ref 36.0–46.0)
Hemoglobin: 12.3 g/dL (ref 12.0–15.0)
Immature Granulocytes: 1 %
Lymphocytes Relative: 22 %
Lymphs Abs: 0.8 10*3/uL (ref 0.7–4.0)
MCH: 32.5 pg (ref 26.0–34.0)
MCHC: 33 g/dL (ref 30.0–36.0)
MCV: 98.7 fL (ref 80.0–100.0)
Monocytes Absolute: 0.3 10*3/uL (ref 0.1–1.0)
Monocytes Relative: 8 %
Neutro Abs: 2.5 10*3/uL (ref 1.7–7.7)
Neutrophils Relative %: 66 %
Platelets: 197 10*3/uL (ref 150–400)
RBC: 3.78 MIL/uL — ABNORMAL LOW (ref 3.87–5.11)
RDW: 14.3 % (ref 11.5–15.5)
WBC: 3.7 10*3/uL — ABNORMAL LOW (ref 4.0–10.5)
nRBC: 0 % (ref 0.0–0.2)

## 2022-04-03 LAB — COMPREHENSIVE METABOLIC PANEL
ALT: 18 U/L (ref 0–44)
AST: 27 U/L (ref 15–41)
Albumin: 3.9 g/dL (ref 3.5–5.0)
Alkaline Phosphatase: 48 U/L (ref 38–126)
Anion gap: 7 (ref 5–15)
BUN: 17 mg/dL (ref 8–23)
CO2: 27 mmol/L (ref 22–32)
Calcium: 8.9 mg/dL (ref 8.9–10.3)
Chloride: 103 mmol/L (ref 98–111)
Creatinine, Ser: 0.93 mg/dL (ref 0.44–1.00)
GFR, Estimated: >60 mL/min (ref >60)
Glucose, Bld: 102 mg/dL — ABNORMAL HIGH (ref 70–99)
Potassium: 4.3 mmol/L (ref 3.5–5.1)
Sodium: 137 mmol/L (ref 135–145)
Total Bilirubin: 1.4 mg/dL — ABNORMAL HIGH (ref 0.3–1.2)
Total Protein: 6.5 g/dL (ref 6.5–8.1)

## 2022-04-04 ENCOUNTER — Encounter: Payer: Self-pay | Admitting: Oncology

## 2022-04-04 LAB — CANCER ANTIGEN 27.29: CA 27.29: 41.5 U/mL — ABNORMAL HIGH (ref 0.0–38.6)

## 2022-04-04 LAB — CANCER ANTIGEN 15-3: CA 15-3: 32.5 U/mL — ABNORMAL HIGH (ref 0.0–25.0)

## 2022-04-04 NOTE — Assessment & Plan Note (Signed)
Continue port flush every 8 weeks.  Patient prefer for labs to be drawn from port in the future.

## 2022-04-04 NOTE — Assessment & Plan Note (Signed)
low-dose Ativan 0.5 mg QHS PRN she currently has not been utilizing due to improved symptoms.

## 2022-04-04 NOTE — Assessment & Plan Note (Signed)
#  Grade 2 neuropathy, continue gabapentin,  Continue gabapentin 600 mg in a.m., 900 mg at bedtime.  

## 2022-04-04 NOTE — Progress Notes (Signed)
Hematology/Oncology Progress note [phone] [phone]      Patient Care Team: Gayland Curry, MD as PCP - General (Family Medicine) Noreene Filbert, MD as Consulting Physician (Radiation Oncology)    ASSESSMENT & PLAN:   Primary malignant neoplasm of breast with metastasis [facility]) Metastatic Breast cancer with extensive thoracic and bone involvement, in visceral crisis, ER+, PR+ HER2 neg CT scan was independently reviewed by me and discussed with Patient.  Stable disease. Labs reviewed and discussed with patient. Continue Abemaciclib 100 mg twice daily plus letrozole 2.44m daily.  Chemotherapy-induced neuropathy (HCC) #Grade 2 neuropathy, continue gabapentin,  Continue gabapentin 600 mg in a.m., 900 mg at bedtime.  Encounter for antineoplastic chemotherapy Chemotherapy plan as listed above  Bilateral pleural effusion moderate on the right side and small on the left side.  Unchanged on recent CT scan.   S/p pallaitve ultrasound-guided right thoracentesis removed 806mRepeat CXR  Port-A-Cath in place Continue port flush every 8 weeks.  Patient prefer for labs to be drawn from port in the future.  [facility]ast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 Zometa Q3 months. Continue calcium and vitamin D supplementation  Anxiety associated with cancer diagnosis (HCHyannislow-dose Ativan 0.5 mg QHS PRN she currently has not been utilizing due to improved symptoms. Orders Placed This Encounter  Procedures   Comprehensive metabolic panel    Standing Status:   Future    Standing Expiration Date:   04/03/2023   CBC with Differential/Platelet    Standing Status:   Future    Standing Expiration Date:   04/04/2023   Cancer antigen 15-3    Standing Status:   Future    Standing Expiration Date:   04/04/2023   Cancer antigen 27.29    Standing Status:   Future     Standing Expiration Date:   04/04/2023   Follow up in 4 weeks.  All questions were answered. The patient knows to call the clinic with any problems, questions or concerns.  ZhEarlie ServerMD, PhD CoSher[facility] Oncology 04/03/2022         CHIEF COMPLAINTS/REASON FOR VISIT:  metastatic breast caner  HISTORY OF PRESENTING ILLNESS:   JeANDA SOBOTTAs a  89.o.  female with PMH listed below presents for follow up for treatment of metastatic breast cancer.   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 Additional metastatic involvement T12, L1 through L3.  No primary tumor site identified. Top differential considerations are Metastatic Disease Unknown primary, less likely Lymphoma or Multiple Myeloma.   2. Superimposed lumbar spine degeneration, including degenerative moderate to severe left L3 and L4 nerve level impingement from disc herniation.      07/02/2021 Imaging   CT chest abdomen pelvis showed innumerable small pulmonary and pleural nodules consistent with diffuse metastasis.  Associated with probable malignant pleural effusion.  Mediastinal and hilar lymphadenopathy consistent with metastatic disease.  Hepatic and bilateral adrenal gland metastasis.  Diffuse extensive destructive metastatic bone disease involving pelvis.   07/12/2021 Cancer Staging   Staging form:  Breast, AJCC 8th Edition - Clinical stage from 07/12/2021: Stage IV (rcTX, cNX, pM1, ER+, PR+, HER2-) - Signed by [name], MD on 03/08/2022 Stage prefix: Recurrence   07/17/2021 Initial Diagnosis   Metastatic breast cancer -history of breast cancer, diagnosed in 2010 Left-sided T2 (2.4cm) N0, ER/PR positive, HER2 negative IDC of the breast, s/p lumpectomy by Dr.Meyer at [facility]. Oncotype score of 11 who completed radiation and she finished 10 years of Femara from 05/2009 and stopped in 2020.  -07/12/2021 patient underwent right thoracentesis.  Cytology was positive for metastatic carcinoma, compatible with breast origin.  ER/PR +, HER2 negative -07/16/2021, patient underwent iliac bone biopsy.  Positive for metastatic carcinoma.   07/21/2021 - 09/16/2021 Chemotherapy   Patient is on Treatment Plan : BREAST Paclitaxel D1,8,15 q28d     09/04/2021 Imaging   CT chest abdomen pelvis showed improvement of the pulmonary nodularity and thickening in the left and the right lung.  Persistent nodular interstitial and pleural thickening remains.  Improvement of mediastinal and hilar adenopathy, hepatic metastatic lesions are similar.  Adrenal gland metastasis is similar.  Interval increase in the sclerotic bone metastasis.    09/30/2021 -  Chemotherapy   started on abemaciclib 100 mg twice daily and letrozole.   02/24/2022 Imaging    CT chest abdomen pelvis with contrast showed unchanged bilateral pleural effusion with associated intralobular septal thickening of the lung bases.  Without discrete nodularity consistent with stable appearance of treated pleural and lymphangitic metastatic disease.  Stable or minimally diminished subcentimeter liver lesions.  Unchanged left adrenal metastasis.  Unchanged widespread sclerotic osseous metastatic disease involving the included axial and appendicular skeleton.  No evidence of new metastatic disease in the chest abdomen or pelvis.  Coronary artery disease.   03/06/2022  Procedure   Ultrasound guided thoracentesis, removal of 820m pleural fluid.     Today she feels well.  Shortness of breath has improved since thoracentesis.  No new complaints.   Review of Systems  Constitutional:  Negative for appetite change, chills, fatigue and fever.  HENT:   Negative for hearing loss and voice change.   Eyes:  Negative for eye problems.  Respiratory:  Negative for chest tightness and cough.   Cardiovascular:  Negative for chest pain.  Gastrointestinal:  Negative for abdominal distention, abdominal pain and blood in stool.  Endocrine: Negative for hot flashes.  Genitourinary:  Negative for difficulty urinating and frequency.   Musculoskeletal:  Negative for arthralgias.  Skin:  Negative for itching and rash.  Neurological:  Positive for numbness. Negative for extremity weakness.  Hematological:  Negative for adenopathy.  Psychiatric/Behavioral:  Negative for confusion.      MEDICAL HISTORY:  Past Medical History:  Diagnosis Date   Breast cancer ([place]    COPD (chronic obstructive pulmonary disease) (HCoker    Family history of adverse reaction to anesthesia    brother has problem with coming out of anesthesia   High cholesterol    Hypertension    Personal history of radiation therapy    Pre-diabetes     SURGICAL HISTORY: Past Surgical History:  Procedure Laterality Date   BREAST LUMPECTOMY Left    2010   COLONOSCOPY     EYE SURGERY Left    Cataract surgery   PORTACATH PLACEMENT N/A 08/01/2021   Procedure:  INSERTION PORT-A-CATH;  Surgeon: Herbert Pun, MD;  Location: [facility] ORS;  Service: General;  Laterality: N/A;   thorocentesis Right 07/10/2021   and another on 07/25/21    SOCIAL HISTORY: Social History   Socioeconomic History   Marital status: Married    Spouse name: Not on file   Number of children: Not on file   Years of education: Not on file   Highest education level: Not on file  Occupational History   Not on file   Tobacco Use   Smoking status: Former    Packs/day: 1.00    Years: 25.00    Total pack years: 25.00    Types: Cigarettes    Quit date: 65    Years since quitting: 33.5   Smokeless tobacco: Never  Vaping Use   Vaping Use: Never used  Substance and Sexual Activity   Alcohol use: Not Currently   Drug use: Never   Sexual activity: Not on file  Other Topics Concern   Not on file  Social History Narrative   Not on file   Social Determinants of Health   Financial Resource Strain: Not on file  Food Insecurity: Not on file  Transportation Needs: Not on file  Physical Activity: Not on file  Stress: Not on file  Social Connections: Not on file  Intimate Partner Violence: Not on file    FAMILY HISTORY: Family History  Problem Relation Age of Onset   Cancer Mother        gynecological   Lung cancer Mother    Diabetes Father    Heart disease Father    Parkinson's disease Father    Brain cancer Brother    Bladder Cancer Brother    Pulmonary disease Brother    Rheumatic fever Brother    Breast cancer Neg Hx     ALLERGIES:  is allergic to green tea (camellia sinensis), melaleuca viridiflora, tea tree oil, and lisinopril.  MEDICATIONS:  Current Outpatient Medications  Medication Sig Dispense Refill   abemaciclib (VERZENIO) 100 MG tablet Take 1 tablet (100 mg total) by mouth 2 (two) times daily. Swallow tablets whole. Do not chew, crush, or split tablets before swallowing. 60 tablet 3   acetaminophen (TYLENOL) 650 MG CR tablet Take 650 mg by mouth every 8 (eight) hours as needed for pain.     aspirin 81 MG EC tablet Take by mouth.     atorvastatin (LIPITOR) 40 MG tablet Take 40 mg by mouth at bedtime.     Boswellia-Glucosamine-Vit D (OSTEO BI-FLEX-GLUCOS/5-LOXIN) TABS Take 1 capsule by mouth in the morning and at bedtime.     Calcium Carb-Cholecalciferol 600-400 MG-UNIT TABS Take 1 tablet by mouth daily.     diphenoxylate-atropine (LOMOTIL) 2.5-0.025 MG tablet Take 1 tablet  by mouth 4 (four) times daily as needed for diarrhea or loose stools. 30 tablet 0   Docusate Sodium (DSS) 100 MG CAPS Take 1 capsule by mouth every other day.     gabapentin (NEURONTIN) 300 MG capsule TAKE 2 CAPSULES BY MOUTH THREE TIMES DAILY 180 capsule 0   ibuprofen (ADVIL) 200 MG tablet Take by mouth. Take 400 mg by mouth every six (6) hours as needed for pain.     letrozole (FEMARA) 2.5 MG tablet Take 1 tablet (2.5 mg total) by mouth daily. 90 tablet 2   lidocaine-prilocaine (EMLA) cream Apply 1 application. topically as needed. 30 g 5   Loperamide HCl (IMODIUM PO) Take by mouth.     LORazepam (ATIVAN) 0.5 MG  tablet TAKE 1 TABLET BY MOUTH AT BEDTIME AS NEEDED FOR ANXIETY 30 tablet 0   losartan (COZAAR) 50 MG tablet Take 50 mg by mouth daily.     magnesium chloride (SLOW-MAG) 64 MG TBEC SR tablet Take 1 tablet by mouth at bedtime.     oxyCODONE (OXY IR/ROXICODONE) 5 MG immediate release tablet Take 1 tablet (5 mg total) by mouth every 12 (twelve) hours as needed for severe pain or moderate pain. 60 tablet 0   potassium chloride SA (KLOR-CON M) 20 MEQ tablet Take 1 tablet (20 mEq total) by mouth daily. 30 tablet 0   sucralfate (CARAFATE) 1 GM/10ML suspension Take 10 mLs (1 g total) by mouth 3 (three) times daily. 420 mL 2   Turmeric (QC TUMERIC COMPLEX PO) Take 1 capsule by mouth at bedtime.     albuterol (VENTOLIN HFA) 108 (90 Base) MCG/ACT inhaler Inhale 2 puffs into the lungs every 6 (six) hours as needed. (Patient not taking: Reported on 12/02/2021)     No current facility-administered medications for this visit.     PHYSICAL EXAMINATION: ECOG PERFORMANCE STATUS: 1 - Symptomatic but completely ambulatory Vitals:   04/03/22 1030  BP: (!) 145/73  Pulse: 84  Temp: (!) 97.1 F (36.2 C)   Filed Weights   04/03/22 1030  Weight: 173 lb (78.5 kg)    Physical Exam Constitutional:      General: She is not in acute distress.    Appearance: She is not diaphoretic.  HENT:     Head:  Normocephalic and atraumatic.     Nose: Nose normal.     Mouth/Throat:     Pharynx: No oropharyngeal exudate.  Eyes:     General: No scleral icterus.    Pupils: Pupils are equal, round, and reactive to light.  Cardiovascular:     Rate and Rhythm: Normal rate and regular rhythm.     Heart sounds: No murmur heard. Pulmonary:     Effort: Pulmonary effort is normal. No respiratory distress.     Comments: Decreased breath sound bilaterally. Abdominal:     General: There is no distension.     Palpations: Abdomen is soft.     Tenderness: There is no abdominal tenderness.  Musculoskeletal:        General: Normal range of motion.     Cervical back: Normal range of motion and neck supple.  Skin:    General: Skin is warm and dry.     Findings: No erythema.  Neurological:     Mental Status: She is alert and oriented to person, place, and time.     Cranial Nerves: No cranial nerve deficit.     Motor: No abnormal muscle tone.     Coordination: Coordination normal.  Psychiatric:        Mood and Affect: Affect normal.       LABORATORY DATA:  I have reviewed the data as listed     Latest Ref Rng & Units 04/03/2022   10:02 AM 03/04/2022    9:30 AM 02/04/2022    9:52 AM  CBC  WBC 4.0 - 10.5 K/uL 3.7  4.3  4.5   Hemoglobin 12.0 - 15.0 g/dL 12.3  11.9  11.9   Hematocrit 36.0 - 46.0 % 37.3  35.2  35.5   Platelets 150 - 400 K/uL 197  218  251       Latest Ref Rng & Units 04/03/2022   10:02 AM 03/04/2022    9:30 AM 02/04/2022  9:52 AM  CMP  Glucose 70 - 99 mg/dL 102  106  108   BUN 8 - 23 mg/dL _0 Creatinine 0.44 - 1.00 mg/dL 0.93  0.88  0.84   Sodium 135 - 145 mmol/L 137  134  136   Potassium 3.5 - 5.1 mmol/L 4.3  3.7  3.5   Chloride 98 - 111 mmol/L 103  102  102   CO2 22 - 32 mmol/L _1 Calcium 8.9 - 10.3 mg/dL 8.9  8.6  9.2   Total Protein 6.5 - 8.1 g/dL 6.5  6.7  6.7   Total Bilirubin 0.3 - 1.2 mg/dL 1.4  1.2  1.4   Alkaline Phos 38 - 126 U/L 48  46  48   AST  15 - 41 U/L _2 ALT 0 - 44 U/L _3 RADIOGRAPHIC STUDIES: I have personally reviewed the radiological images as listed and agreed with the findings in the report. US THORACENTESIS ASP PLEURAL SPACE W/IMG GUIDE  Result Date: 03/06/2022 INDICATION: Patient with history of metastatic breast cancer complaining of dyspnea. Request for therapeutic right thoracentesis. EXAM: ULTRASOUND GUIDED THERAPEUTIC RIGHT THORACENTESIS MEDICATIONS: 10 mL 1 % lidocaine COMPLICATIONS: None PROCEDURE: An ultrasound guided thoracentesis was thoroughly discussed with the patient and questions answered. The benefits, risks, alternatives and complications were also discussed. The patient understands and wishes to proceed with the procedure. Written consent was obtained. Ultrasound was performed to localize and mark an adequate pocket of fluid in the right chest. The area was then prepped and draped in the normal sterile fashion. 1% Lidocaine was used for local anesthesia. Under ultrasound guidance a 6 Fr Safe-T-Centesis catheter was introduced. Thoracentesis was performed. The catheter was removed and a dressing applied. FINDINGS: A total of approximately 800 mL of clear, yellow fluid was removed. IMPRESSION: Successful ultrasound guided right thoracentesis yielding 800 mL of pleural fluid. Read by: Narda Rutherford, AGNP-BC Electronically Signed   By: Albin Felling M.D.   On: 03/06/2022 14:26   DG Chest Port 1 View  Result Date: 03/06/2022 CLINICAL DATA:  Status post thoracentesis EXAM: PORTABLE CHEST 1 VIEW COMPARISON:  12/29/2021 FINDINGS: Port in the anterior chest wall with tip in distal SVC. Reduction in RIGHT pleural fluid. No pneumothorax. Mild LEFT basilar atelectasis. IMPRESSION: No pneumothorax. Electronically Signed   By: Suzy Bouchard M.D.   On: 03/06/2022 14:23

## 2022-04-04 NOTE — Assessment & Plan Note (Signed)
Metastatic Breast cancer with extensive thoracic and bone involvement, in visceral crisis, ER+, PR+ HER2 neg CT scan was independently reviewed by me and discussed with Patient.  Stable disease. Labs reviewed and discussed with patient. Continue Abemaciclib 100 mg twice daily plus letrozole 2.10m daily.

## 2022-04-04 NOTE — Assessment & Plan Note (Addendum)
moderate on the right side and small on the left side.  Unchanged on recent CT scan.   S/p pallaitve ultrasound-guided right thoracentesis removed 832m Repeat CXR

## 2022-04-04 NOTE — Assessment & Plan Note (Addendum)
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 Zometa Q3 months. Continue calcium and vitamin D supplementation

## 2022-04-04 NOTE — Assessment & Plan Note (Signed)
Chemotherapy plan as listed above 

## 2022-04-04 NOTE — Assessment & Plan Note (Deleted)
moderate on the right side and small on the left side.  Unchanged on recent CT scan.   S/p pallaitve ultrasound-guided right thoracentesis. Repeat CXR

## 2022-04-06 ENCOUNTER — Other Ambulatory Visit (HOSPITAL_COMMUNITY): Payer: Self-pay

## 2022-04-07 ENCOUNTER — Other Ambulatory Visit (HOSPITAL_COMMUNITY): Payer: Self-pay

## 2022-04-10 ENCOUNTER — Other Ambulatory Visit: Payer: Self-pay | Admitting: Oncology

## 2022-04-13 ENCOUNTER — Encounter: Payer: Self-pay | Admitting: Oncology

## 2022-04-14 ENCOUNTER — Ambulatory Visit
Admission: RE | Admit: 2022-04-14 | Discharge: 2022-04-14 | Disposition: A | Discharge status: Home / Self Care | Payer: Medicare PPO | Source: Ambulatory Visit | Attending: Oncology | Admitting: Oncology

## 2022-04-14 ENCOUNTER — Ambulatory Visit
Admission: RE | Admit: 2022-04-14 | Discharge: 2022-04-14 | Disposition: A | Discharge status: Home / Self Care | Payer: Medicare PPO | Attending: Oncology | Admitting: Oncology

## 2022-04-14 DIAGNOSIS — C50919 Malignant neoplasm of unspecified site of unspecified female breast: Secondary | ICD-10-CM

## 2022-04-14 DIAGNOSIS — J90 Pleural effusion, not elsewhere classified: Secondary | ICD-10-CM | POA: Insufficient documentation

## 2022-04-28 ENCOUNTER — Encounter: Payer: Self-pay | Admitting: Oncology

## 2022-04-28 ENCOUNTER — Inpatient Hospital Stay: Payer: Medicare PPO | Admitting: Oncology

## 2022-04-28 ENCOUNTER — Other Ambulatory Visit (HOSPITAL_COMMUNITY): Payer: Self-pay

## 2022-04-28 ENCOUNTER — Inpatient Hospital Stay: Payer: Medicare PPO | Attending: Oncology

## 2022-04-28 DIAGNOSIS — Z170 Estrogen receptor positive status [ER+]: Secondary | ICD-10-CM | POA: Diagnosis not present

## 2022-04-28 DIAGNOSIS — C50919 Malignant neoplasm of unspecified site of unspecified female breast: Secondary | ICD-10-CM | POA: Diagnosis present

## 2022-04-28 DIAGNOSIS — C7971 Secondary malignant neoplasm of right adrenal gland: Secondary | ICD-10-CM | POA: Diagnosis not present

## 2022-04-28 DIAGNOSIS — Z79899 Other long term (current) drug therapy: Secondary | ICD-10-CM | POA: Insufficient documentation

## 2022-04-28 DIAGNOSIS — C787 Secondary malignant neoplasm of liver and intrahepatic bile duct: Secondary | ICD-10-CM | POA: Diagnosis not present

## 2022-04-28 DIAGNOSIS — Z95828 Presence of other vascular implants and grafts: Secondary | ICD-10-CM

## 2022-04-28 DIAGNOSIS — C7951 Secondary malignant neoplasm of bone: Secondary | ICD-10-CM | POA: Insufficient documentation

## 2022-04-28 DIAGNOSIS — T451X5A Adverse effect of antineoplastic and immunosuppressive drugs, initial encounter: Secondary | ICD-10-CM | POA: Insufficient documentation

## 2022-04-28 DIAGNOSIS — C7972 Secondary malignant neoplasm of left adrenal gland: Secondary | ICD-10-CM | POA: Diagnosis not present

## 2022-04-28 DIAGNOSIS — J90 Pleural effusion, not elsewhere classified: Secondary | ICD-10-CM | POA: Diagnosis not present

## 2022-04-28 DIAGNOSIS — Z5111 Encounter for antineoplastic chemotherapy: Secondary | ICD-10-CM

## 2022-04-28 DIAGNOSIS — G620 Drug-induced polyneuropathy: Secondary | ICD-10-CM | POA: Insufficient documentation

## 2022-04-28 LAB — CBC WITH DIFFERENTIAL/PLATELET
Abs Immature Granulocytes: 0.01 10*3/uL (ref 0.00–0.07)
Basophils Absolute: 0 10*3/uL (ref 0.0–0.1)
Basophils Relative: 1 %
Eosinophils Absolute: 0.1 10*3/uL (ref 0.0–0.5)
Eosinophils Relative: 2 %
HCT: 36.9 % (ref 36.0–46.0)
Hemoglobin: 12.4 g/dL (ref 12.0–15.0)
Immature Granulocytes: 0 %
Lymphocytes Relative: 18 %
Lymphs Abs: 0.7 10*3/uL (ref 0.7–4.0)
MCH: 32 pg (ref 26.0–34.0)
MCHC: 33.6 g/dL (ref 30.0–36.0)
MCV: 95.3 fL (ref 80.0–100.0)
Monocytes Absolute: 0.4 10*3/uL (ref 0.1–1.0)
Monocytes Relative: 12 %
Neutro Abs: 2.5 10*3/uL (ref 1.7–7.7)
Neutrophils Relative %: 67 %
Platelets: 205 10*3/uL (ref 150–400)
RBC: 3.87 MIL/uL (ref 3.87–5.11)
RDW: 14.1 % (ref 11.5–15.5)
WBC: 3.7 10*3/uL — ABNORMAL LOW (ref 4.0–10.5)
nRBC: 0 % (ref 0.0–0.2)

## 2022-04-28 LAB — COMPREHENSIVE METABOLIC PANEL
ALT: 19 U/L (ref 0–44)
AST: 24 U/L (ref 15–41)
Albumin: 4.1 g/dL (ref 3.5–5.0)
Alkaline Phosphatase: 45 U/L (ref 38–126)
Anion gap: 7 (ref 5–15)
BUN: 15 mg/dL (ref 8–23)
CO2: 27 mmol/L (ref 22–32)
Calcium: 9.3 mg/dL (ref 8.9–10.3)
Chloride: 104 mmol/L (ref 98–111)
Creatinine, Ser: 0.85 mg/dL (ref 0.44–1.00)
GFR, Estimated: >60 mL/min (ref >60)
Glucose, Bld: 102 mg/dL — ABNORMAL HIGH (ref 70–99)
Potassium: 4 mmol/L (ref 3.5–5.1)
Sodium: 138 mmol/L (ref 135–145)
Total Bilirubin: 1.2 mg/dL (ref 0.3–1.2)
Total Protein: 6.7 g/dL (ref 6.5–8.1)

## 2022-04-28 MED ORDER — HEPARIN SOD (PORK) LOCK FLUSH 100 UNIT/ML IV SOLN
500.0000 [IU] | Freq: Once | INTRAVENOUS | Status: AC
  Administered 2022-04-28: 500 [IU] via INTRAVENOUS
  Filled 2022-04-28: qty 5

## 2022-04-28 MED ORDER — SODIUM CHLORIDE 0.9% FLUSH
10.0000 mL | Freq: Once | INTRAVENOUS | Status: AC
  Administered 2022-04-28: 10 mL via INTRAVENOUS
  Filled 2022-04-28: qty 10

## 2022-04-28 MED ORDER — ABEMACICLIB 100 MG PO TABS
ORAL_TABLET | ORAL | 3 refills | Status: DC
  Filled 2022-04-28: qty 60, fill #0
  Filled 2022-04-28: qty 56, 28d supply, fill #0
  Filled 2022-05-28: qty 56, 28d supply, fill #1
  Filled 2022-06-25: qty 56, 28d supply, fill #2
  Filled 2022-07-21: qty 56, 28d supply, fill #3

## 2022-04-28 MED ORDER — GABAPENTIN 300 MG PO CAPS: 300.0000 mg | ORAL_CAPSULE | ORAL | 2 refills | Status: DC

## 2022-04-28 NOTE — Assessment & Plan Note (Addendum)
Metastatic Breast cancer with extensive thoracic and bone involvement, in visceral crisis, ER+, PR+ HER2 neg CT scan was independently reviewed by me and discussed with Patient.  Stable disease. Labs reviewed and discussed with patient. Continue Abemaciclib 100 mg twice daily plus letrozole 2.54m daily. Repeat CT scan in late August/early September.

## 2022-04-28 NOTE — Assessment & Plan Note (Signed)
#  Grade 2 neuropathy, continue gabapentin,  Continue gabapentin 600 mg in a.m., 900 mg at bedtime. Refills sent.

## 2022-04-28 NOTE — Assessment & Plan Note (Signed)
Chemotherapy plan as listed above 

## 2022-04-28 NOTE — Assessment & Plan Note (Signed)
Continue port flush every 8 weeks.  Patient prefer for labs to be drawn from port in the future.

## 2022-04-28 NOTE — Assessment & Plan Note (Signed)
moderate on the right side and small on the left side.  Unchanged on recent CT scan.   S/p pallaitve ultrasound-guided right thoracentesis removed 887m  Chest x-ray results reviewed and discussed with patient.  Stable small bilateral pleural effusion.

## 2022-04-28 NOTE — Progress Notes (Signed)
Pt here for follow up. Pt reports pain to right buttock.

## 2022-04-28 NOTE — Progress Notes (Signed)
Hematology/Oncology Progress note [phone] [phone]      Patient Care Team: Gayland Curry, MD as PCP - General (Family Medicine) Noreene Filbert, MD as Consulting Physician (Radiation Oncology)    ASSESSMENT & PLAN:   Cancer Staging  Primary malignant neoplasm of breast with metastasis [facility]) Staging form: Breast, AJCC 8th Edition - Clinical stage from 07/12/2021: Stage IV (rcTX, cNX, pM1, ER+, PR+, HER2-) - Signed by [name], MD on 03/08/2022   Primary malignant neoplasm of breast with metastasis ([place]) Metastatic Breast cancer with extensive thoracic and bone involvement, in visceral crisis, ER+, PR+ HER2 neg CT scan was independently reviewed by me and discussed with Patient.  Stable disease. Labs reviewed and discussed with patient. Continue Abemaciclib 100 mg twice daily plus letrozole 2.29m daily. Repeat CT scan in late August/early September.  Encounter for antineoplastic chemotherapy Chemotherapy plan as listed above  Metastasis to bone ([facility] 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 Zometa Q3 months. Continue calcium and vitamin D supplementation Right buttock pain, check bone scan.  Chemotherapy-induced neuropathy (HCC) #Grade 2 neuropathy, continue gabapentin,  Continue gabapentin 600 mg in a.m., 900 mg at bedtime. Refills sent.  Bilateral pleural effusion moderate on the right side and small on the left side.  Unchanged on recent CT scan.   S/p pallaitve ultrasound-guided right thoracentesis removed 8066m Chest x-ray results reviewed and discussed with patient.  Stable small bilateral pleural effusion.  Port-A-Cath in place Continue port flush every 8 weeks.  Patient prefer for labs to be drawn from port in the future. Orders Placed This Encounter  Procedures   CT CHEST ABDOMEN PELVIS W CONTRAST    Standing Status:   Future    Standing  Expiration Date:   04/29/2023    Order Specific Question:   Preferred imaging location?    Answer:   [facility]    Order Specific Question:   Is Oral Contrast requested for this exam?    Answer:   Yes, Per Radiology protocol   NM Bone Scan Whole Body    Standing Status:   Future    Standing Expiration Date:   04/28/2023    Order Specific Question:   If indicated for the ordered procedure, I authorize the administration of a radiopharmaceutical per Radiology protocol    Answer:   Yes    Order Specific Question:   Preferred imaging location?    Answer:   [facility]   CBC with Differential/Platelet    Standing Status:   Future    Standing Expiration Date:   04/29/2023   Comprehensive metabolic panel    Standing Status:   Future    Standing Expiration Date:   04/28/2023   Cancer antigen 15-3    Standing Status:   Future    Standing Expiration Date:   04/29/2023   Cancer antigen 27.29    Standing Status:   Future    Standing Expiration Date:   04/29/2023   Follow up -lab MD a few days after CT and bone scan. All questions were answered. The patient knows to call the clinic with any problems, questions or concerns.  ZhEarlie ServerMD, PhD [facility] Oncology 04/28/2022         CHIEF COMPLAINTS/REASON FOR VISIT:  metastatic breast caner  HISTORY OF PRESENTING ILLNESS:   JeSHERRYANN FRESEs a  [age].o.  female with PMH listed below presents for follow up for  treatment of metastatic breast cancer.   Oncology History  Metastasis to bone of unknown primary ([place])  07/12/2021 Initial Diagnosis   Metastasis to bone of unknown primary ([place])   07/21/2021 - 09/16/2021 Chemotherapy   Patient is on Treatment Plan : BREAST Paclitaxel D1,8,15 q28d     Primary malignant neoplasm of breast with metastasis ([place])  06/25/2021 Imaging   MRI lumbar spine IMPRESSION:  1. Extensive malignant tumor replacing the bones of the lower lumbar vertebrae (L4 and L5), visible sacrum,  and pelvis. 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 Additional metastatic involvement T12, L1 through L3.  No primary tumor site identified. Top differential considerations are Metastatic Disease Unknown primary, less likely Lymphoma or Multiple Myeloma.   2. Superimposed lumbar spine degeneration, including degenerative moderate to severe left L3 and L4 nerve level impingement from disc herniation.      07/02/2021 Imaging   CT chest abdomen pelvis showed innumerable small pulmonary and pleural nodules consistent with diffuse metastasis.  Associated with probable malignant pleural effusion.  Mediastinal and hilar lymphadenopathy consistent with metastatic disease.  Hepatic and bilateral adrenal gland metastasis.  Diffuse extensive destructive metastatic bone disease involving pelvis.   07/12/2021 Cancer Staging   Staging form: Breast, AJCC 8th Edition - Clinical stage from 07/12/2021: Stage IV (rcTX, cNX, pM1, ER+, PR+, HER2-) - Signed by [name], MD on 03/08/2022 Stage prefix: Recurrence   07/17/2021 Initial Diagnosis   Metastatic breast cancer -history of breast cancer, diagnosed in 2010 Left-sided T2 (2.4cm) N0, ER/PR positive, HER2 negative IDC of the breast, s/p lumpectomy by Dr.Meyer at [facility] Oncotype score of 11 who completed radiation and she finished 10 years of Femara from 05/2009 and stopped in 2020.  -07/12/2021 patient underwent right thoracentesis.  Cytology was positive for metastatic carcinoma, compatible with breast origin.  ER/PR +, HER2 negative -07/16/2021, patient underwent iliac bone biopsy.  Positive for metastatic carcinoma.   07/21/2021 - 09/16/2021 Chemotherapy   Patient is on Treatment Plan : BREAST Paclitaxel D1,8,15 q28d     09/04/2021 Imaging   CT chest abdomen pelvis showed improvement of the pulmonary nodularity and thickening in  the left and the right lung.  Persistent nodular interstitial and pleural thickening remains.  Improvement of mediastinal and hilar adenopathy, hepatic metastatic lesions are similar.  Adrenal gland metastasis is similar.  Interval increase in the sclerotic bone metastasis.    09/30/2021 -  Chemotherapy   started on abemaciclib 100 mg twice daily and letrozole.   02/24/2022 Imaging    CT chest abdomen pelvis with contrast showed unchanged bilateral pleural effusion with associated intralobular septal thickening of the lung bases.  Without discrete nodularity consistent with stable appearance of treated pleural and lymphangitic metastatic disease.  Stable or minimally diminished subcentimeter liver lesions.  Unchanged left adrenal metastasis.  Unchanged widespread sclerotic osseous metastatic disease involving the included axial and appendicular skeleton.  No evidence of new metastatic disease in the chest abdomen or pelvis.  Coronary artery disease.   03/06/2022 Procedure   Ultrasound guided thoracentesis, removal of 88m pleural fluid.     Today she feels well.  Mild shortness of breath, unchanged.  Not worse.  She has no new complaints.   Review of Systems  Constitutional:  Negative for appetite change, chills, fatigue and fever.  HENT:   Negative for hearing loss and voice change.   Eyes:  Negative for eye problems.  Respiratory:  Negative for chest tightness and cough.   Cardiovascular:  Negative for chest pain.  Gastrointestinal:  Negative for abdominal distention, abdominal pain and blood in stool.  Endocrine: Negative for hot flashes.  Genitourinary:  Negative for difficulty urinating and frequency.   Musculoskeletal:  Negative for arthralgias.  Skin:  Negative for itching and rash.  Neurological:  Positive for numbness. Negative for extremity weakness.  Hematological:  Negative for adenopathy.  Psychiatric/Behavioral:  Negative for confusion.      MEDICAL HISTORY:  Past Medical  History:  Diagnosis Date   Breast cancer ([place])    COPD (chronic obstructive pulmonary disease) ([place])    Family history of adverse reaction to anesthesia    brother has problem with coming out of anesthesia   High cholesterol    Hypertension    Personal history of radiation therapy    Pre-diabetes     SURGICAL HISTORY: Past Surgical History:  Procedure Laterality Date   BREAST LUMPECTOMY Left    2010   COLONOSCOPY     EYE SURGERY Left    Cataract surgery   PORTACATH PLACEMENT N/A 08/01/2021   Procedure: INSERTION PORT-A-CATH;  Surgeon: Herbert Pun, MD;  Location: [facility] ORS;  Service: General;  Laterality: N/A;   thorocentesis Right 07/10/2021   and another on 07/25/21    SOCIAL HISTORY: Social History   Socioeconomic History   Marital status: Married    Spouse name: Not on file   Number of children: Not on file   Years of education: Not on file   Highest education level: Not on file  Occupational History   Not on file  Tobacco Use   Smoking status: Former    Packs/day: 1.00    Years: 25.00    Total pack years: 25.00    Types: Cigarettes    Quit date: 68    Years since quitting: 33.6   Smokeless tobacco: Never  Vaping Use   Vaping Use: Never used  Substance and Sexual Activity   Alcohol use: Not Currently   Drug use: Never   Sexual activity: Not on file  Other Topics Concern   Not on file  Social History Narrative   Not on file   Social Determinants of Health   Financial Resource Strain: Not on file  Food Insecurity: Not on file  Transportation Needs: Not on file  Physical Activity: Not on file  Stress: Not on file  Social Connections: Not on file  Intimate Partner Violence: Not on file    FAMILY HISTORY: Family History  Problem Relation Age of Onset   Cancer Mother        gynecological   Lung cancer Mother    Diabetes Father    Heart disease Father    Parkinson's disease Father    Brain cancer Brother    Bladder Cancer Brother     Pulmonary disease Brother    Rheumatic fever Brother    Breast cancer Neg Hx     ALLERGIES:  is allergic to green tea (camellia sinensis), melaleuca viridiflora, tea tree oil, and lisinopril.  MEDICATIONS:  Current Outpatient Medications  Medication Sig Dispense Refill   acetaminophen (TYLENOL) 650 MG CR tablet Take 650 mg by mouth every 8 (eight) hours as needed for pain.     aspirin EC 81 MG tablet Take by mouth.     atorvastatin (LIPITOR) 40 MG tablet Take 40 mg by mouth at bedtime.     Boswellia-Glucosamine-Vit D (OSTEO BI-FLEX-GLUCOS/5-LOXIN) TABS Take 1 capsule  by mouth in the morning and at bedtime.     Calcium Carb-Cholecalciferol 600-400 MG-UNIT TABS Take 1 tablet by mouth daily.     Docusate Sodium (DSS) 100 MG CAPS Take 1 capsule by mouth every other day.     ibuprofen (ADVIL) 200 MG tablet Take by mouth. Take 400 mg by mouth every six (6) hours as needed for pain.     letrozole (FEMARA) 2.5 MG tablet Take 1 tablet (2.5 mg total) by mouth daily. 90 tablet 2   lidocaine-prilocaine (EMLA) cream Apply 1 application. topically as needed. 30 g 5   LORazepam (ATIVAN) 0.5 MG tablet TAKE 1 TABLET BY MOUTH AT BEDTIME AS NEEDED FOR ANXIETY 30 tablet 0   losartan (COZAAR) 50 MG tablet Take 50 mg by mouth daily.     magnesium chloride (SLOW-MAG) 64 MG TBEC SR tablet Take 1 tablet by mouth at bedtime.     Turmeric (QC TUMERIC COMPLEX PO) Take 1 capsule by mouth at bedtime.     abemaciclib (VERZENIO) 100 MG tablet Take 1 tablet (100 mg total) by mouth 2 (two) times daily. Swallow tablets whole. Do not chew, crush, or split tablets before swallowing. 56 tablet 3   diphenoxylate-atropine (LOMOTIL) 2.5-0.025 MG tablet Take 1 tablet by mouth 4 (four) times daily as needed for diarrhea or loose stools. (Patient not taking: Reported on 04/28/2022) 30 tablet 0   gabapentin (NEURONTIN) 300 MG capsule Take 1 capsule (300 mg total) by mouth See admin instructions. Take 2 tablets in morning and take 3  tablets in evening. 150 capsule 2   Loperamide HCl (IMODIUM PO) Take by mouth. (Patient not taking: Reported on 04/28/2022)     oxyCODONE (OXY IR/ROXICODONE) 5 MG immediate release tablet Take 1 tablet (5 mg total) by mouth every 12 (twelve) hours as needed for severe pain or moderate pain. (Patient not taking: Reported on 04/28/2022) 60 tablet 0   potassium chloride SA (KLOR-CON M) 20 MEQ tablet Take 1 tablet (20 mEq total) by mouth daily. (Patient not taking: Reported on 04/28/2022) 30 tablet 0   No current facility-administered medications for this visit.     PHYSICAL EXAMINATION: ECOG PERFORMANCE STATUS: 1 - Symptomatic but completely ambulatory Vitals:   04/28/22 1043  BP: 139/67  Pulse: (!) 49  Resp: 18  Temp: 98.5 F (36.9 C)   Filed Weights   04/28/22 1043  Weight: 174 lb 6.4 oz (79.1 kg)    Physical Exam Constitutional:      General: She is not in acute distress.    Appearance: She is not diaphoretic.  HENT:     Head: Normocephalic and atraumatic.     Nose: Nose normal.     Mouth/Throat:     Pharynx: No oropharyngeal exudate.  Eyes:     General: No scleral icterus.    Pupils: Pupils are equal, round, and reactive to light.  Cardiovascular:     Rate and Rhythm: Normal rate and regular rhythm.     Heart sounds: No murmur heard. Pulmonary:     Effort: Pulmonary effort is normal. No respiratory distress.     Comments: Decreased breath sound bilaterally. Abdominal:     General: There is no distension.     Palpations: Abdomen is soft.     Tenderness: There is no abdominal tenderness.  Musculoskeletal:        General: Normal range of motion.     Cervical back: Normal range of motion and neck supple.  Skin:    General:  Skin is warm and dry.     Findings: No erythema.  Neurological:     Mental Status: She is alert and oriented to person, place, and time.     Cranial Nerves: No cranial nerve deficit.     Motor: No abnormal muscle tone.     Coordination: Coordination  normal.  Psychiatric:        Mood and Affect: Affect normal.       LABORATORY DATA:  I have reviewed the data as listed     Latest Ref Rng & Units 04/28/2022   10:26 AM 04/03/2022   10:02 AM 03/04/2022    9:30 AM  CBC  WBC 4.0 - 10.5 K/uL 3.7  3.7  4.3   Hemoglobin 12.0 - 15.0 g/dL 12.4  12.3  11.9   Hematocrit 36.0 - 46.0 % 36.9  37.3  35.2   Platelets 150 - 400 K/uL 205  197  218       Latest Ref Rng & Units 04/28/2022   10:26 AM 04/03/2022   10:02 AM 03/04/2022    9:30 AM  CMP  Glucose 70 - 99 mg/dL 102  102  106   BUN 8 - 23 mg/dL 15  17  18    Creatinine 0.44 - 1.00 mg/dL 0.85  0.93  0.88   Sodium 135 - 145 mmol/L 138  137  134   Potassium 3.5 - 5.1 mmol/L 4.0  4.3  3.7   Chloride 98 - 111 mmol/L 104  103  102   CO2 22 - 32 mmol/L 27  27  27    Calcium 8.9 - 10.3 mg/dL 9.3  8.9  8.6   Total Protein 6.5 - 8.1 g/dL 6.7  6.5  6.7   Total Bilirubin 0.3 - 1.2 mg/dL 1.2  1.4  1.2   Alkaline Phos 38 - 126 U/L 45  48  46   AST 15 - 41 U/L 24  27  26    ALT 0 - 44 U/L 19  18  19       RADIOGRAPHIC STUDIES: I have personally reviewed the radiological images as listed and agreed with the findings in the report. DG Chest 2 View  Result Date: 04/15/2022 CLINICAL DATA:  Shortness of breath. EXAM: CHEST - 2 VIEW COMPARISON:  March 06, 2022. FINDINGS: Stable cardiomediastinal silhouette. Right internal jugular Port-A-Cath is unchanged in position. Minimal bibasilar subsegmental atelectasis is noted with small bilateral pleural effusions. Bony thorax is unremarkable. IMPRESSION: Minimal bibasilar subsegmental atelectasis with small bilateral pleural effusions. Electronically Signed   By: Marijo Conception M.D.   On: 04/15/2022 11:29

## 2022-04-28 NOTE — Assessment & Plan Note (Addendum)
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 Zometa Q3 months. Continue calcium and vitamin D supplementation Right buttock pain, check bone scan.

## 2022-04-29 ENCOUNTER — Other Ambulatory Visit (HOSPITAL_COMMUNITY): Payer: Self-pay

## 2022-04-29 LAB — CANCER ANTIGEN 15-3: CA 15-3: 31.2 U/mL — ABNORMAL HIGH (ref 0.0–25.0)

## 2022-04-29 LAB — CANCER ANTIGEN 27.29: CA 27.29: 37 U/mL (ref 0.0–38.6)

## 2022-05-04 DIAGNOSIS — I493 Ventricular premature depolarization: Secondary | ICD-10-CM | POA: Insufficient documentation

## 2022-05-04 DIAGNOSIS — R0602 Shortness of breath: Secondary | ICD-10-CM | POA: Insufficient documentation

## 2022-05-27 ENCOUNTER — Other Ambulatory Visit (HOSPITAL_COMMUNITY): Payer: Self-pay

## 2022-05-28 ENCOUNTER — Other Ambulatory Visit (HOSPITAL_COMMUNITY): Payer: Self-pay

## 2022-05-29 ENCOUNTER — Ambulatory Visit
Admission: RE | Admit: 2022-05-29 | Discharge: 2022-05-29 | Disposition: A | Discharge status: Home / Self Care | Payer: Medicare PPO | Source: Ambulatory Visit | Attending: Oncology | Admitting: Oncology

## 2022-05-29 DIAGNOSIS — I3139 Other pericardial effusion (noninflammatory): Secondary | ICD-10-CM | POA: Insufficient documentation

## 2022-05-29 DIAGNOSIS — C7951 Secondary malignant neoplasm of bone: Secondary | ICD-10-CM | POA: Diagnosis not present

## 2022-05-29 DIAGNOSIS — K769 Liver disease, unspecified: Secondary | ICD-10-CM | POA: Insufficient documentation

## 2022-05-29 DIAGNOSIS — I700 Atherosclerosis of aorta: Secondary | ICD-10-CM | POA: Insufficient documentation

## 2022-05-29 DIAGNOSIS — C50919 Malignant neoplasm of unspecified site of unspecified female breast: Secondary | ICD-10-CM | POA: Insufficient documentation

## 2022-05-29 DIAGNOSIS — J90 Pleural effusion, not elsewhere classified: Secondary | ICD-10-CM | POA: Insufficient documentation

## 2022-05-29 MED ORDER — IOHEXOL 300 MG/ML  SOLN
100.0000 mL | Freq: Once | INTRAMUSCULAR | Status: AC | PRN
  Administered 2022-05-29: 100 mL via INTRAVENOUS

## 2022-06-04 ENCOUNTER — Ambulatory Visit
Admission: RE | Admit: 2022-06-04 | Discharge: 2022-06-04 | Disposition: A | Discharge status: Home / Self Care | Payer: Medicare PPO | Source: Ambulatory Visit | Attending: Oncology | Admitting: Oncology

## 2022-06-04 ENCOUNTER — Other Ambulatory Visit: Payer: Self-pay | Admitting: Family Medicine

## 2022-06-04 ENCOUNTER — Other Ambulatory Visit (HOSPITAL_COMMUNITY): Payer: Self-pay

## 2022-06-04 DIAGNOSIS — C50919 Malignant neoplasm of unspecified site of unspecified female breast: Secondary | ICD-10-CM | POA: Diagnosis not present

## 2022-06-04 DIAGNOSIS — Z1231 Encounter for screening mammogram for malignant neoplasm of breast: Secondary | ICD-10-CM

## 2022-06-04 MED ORDER — TECHNETIUM TC 99M MEDRONATE IV KIT
20.0000 | PACK | Freq: Once | INTRAVENOUS | Status: AC | PRN
  Administered 2022-06-04: 20.01 via INTRAVENOUS

## 2022-06-09 ENCOUNTER — Inpatient Hospital Stay: Payer: Medicare PPO | Attending: Oncology

## 2022-06-09 ENCOUNTER — Other Ambulatory Visit: Payer: Self-pay | Admitting: Oncology

## 2022-06-09 ENCOUNTER — Ambulatory Visit
Admission: RE | Admit: 2022-06-09 | Discharge: 2022-06-09 | Disposition: A | Discharge status: Home / Self Care | Payer: Medicare PPO | Source: Ambulatory Visit | Attending: Oncology | Admitting: Oncology

## 2022-06-09 ENCOUNTER — Inpatient Hospital Stay: Payer: Medicare PPO | Admitting: Oncology

## 2022-06-09 ENCOUNTER — Encounter: Payer: Self-pay | Admitting: Oncology

## 2022-06-09 ENCOUNTER — Inpatient Hospital Stay: Payer: Medicare PPO

## 2022-06-09 VITALS — BP 129/71 | HR 48 | Resp 18 | Ht 64.0 in | Wt 177.0 lb

## 2022-06-09 DIAGNOSIS — I4891 Unspecified atrial fibrillation: Secondary | ICD-10-CM

## 2022-06-09 DIAGNOSIS — I480 Paroxysmal atrial fibrillation: Secondary | ICD-10-CM | POA: Insufficient documentation

## 2022-06-09 DIAGNOSIS — C50919 Malignant neoplasm of unspecified site of unspecified female breast: Secondary | ICD-10-CM | POA: Diagnosis not present

## 2022-06-09 DIAGNOSIS — Z7901 Long term (current) use of anticoagulants: Secondary | ICD-10-CM | POA: Diagnosis not present

## 2022-06-09 DIAGNOSIS — C7951 Secondary malignant neoplasm of bone: Secondary | ICD-10-CM | POA: Insufficient documentation

## 2022-06-09 DIAGNOSIS — G620 Drug-induced polyneuropathy: Secondary | ICD-10-CM

## 2022-06-09 DIAGNOSIS — M25551 Pain in right hip: Secondary | ICD-10-CM

## 2022-06-09 DIAGNOSIS — Z5111 Encounter for antineoplastic chemotherapy: Secondary | ICD-10-CM

## 2022-06-09 DIAGNOSIS — M898X5 Other specified disorders of bone, thigh: Secondary | ICD-10-CM

## 2022-06-09 DIAGNOSIS — Z170 Estrogen receptor positive status [ER+]: Secondary | ICD-10-CM | POA: Insufficient documentation

## 2022-06-09 DIAGNOSIS — T451X5A Adverse effect of antineoplastic and immunosuppressive drugs, initial encounter: Secondary | ICD-10-CM

## 2022-06-09 DIAGNOSIS — C7972 Secondary malignant neoplasm of left adrenal gland: Secondary | ICD-10-CM | POA: Insufficient documentation

## 2022-06-09 DIAGNOSIS — Z79811 Long term (current) use of aromatase inhibitors: Secondary | ICD-10-CM | POA: Insufficient documentation

## 2022-06-09 DIAGNOSIS — Z923 Personal history of irradiation: Secondary | ICD-10-CM | POA: Diagnosis not present

## 2022-06-09 DIAGNOSIS — R001 Bradycardia, unspecified: Secondary | ICD-10-CM | POA: Diagnosis not present

## 2022-06-09 DIAGNOSIS — Z79899 Other long term (current) drug therapy: Secondary | ICD-10-CM | POA: Insufficient documentation

## 2022-06-09 DIAGNOSIS — C787 Secondary malignant neoplasm of liver and intrahepatic bile duct: Secondary | ICD-10-CM | POA: Diagnosis not present

## 2022-06-09 DIAGNOSIS — C771 Secondary and unspecified malignant neoplasm of intrathoracic lymph nodes: Secondary | ICD-10-CM | POA: Insufficient documentation

## 2022-06-09 LAB — CBC WITH DIFFERENTIAL/PLATELET
Abs Immature Granulocytes: 0.02 10*3/uL (ref 0.00–0.07)
Basophils Absolute: 0 10*3/uL (ref 0.0–0.1)
Basophils Relative: 1 %
Eosinophils Absolute: 0.1 10*3/uL (ref 0.0–0.5)
Eosinophils Relative: 2 %
HCT: 34.3 % — ABNORMAL LOW (ref 36.0–46.0)
Hemoglobin: 11.2 g/dL — ABNORMAL LOW (ref 12.0–15.0)
Immature Granulocytes: 1 %
Lymphocytes Relative: 17 %
Lymphs Abs: 0.7 10*3/uL (ref 0.7–4.0)
MCH: 31.9 pg (ref 26.0–34.0)
MCHC: 32.7 g/dL (ref 30.0–36.0)
MCV: 97.7 fL (ref 80.0–100.0)
Monocytes Absolute: 0.4 10*3/uL (ref 0.1–1.0)
Monocytes Relative: 9 %
Neutro Abs: 2.9 10*3/uL (ref 1.7–7.7)
Neutrophils Relative %: 70 %
Platelets: 198 10*3/uL (ref 150–400)
RBC: 3.51 MIL/uL — ABNORMAL LOW (ref 3.87–5.11)
RDW: 14.4 % (ref 11.5–15.5)
WBC: 4.1 10*3/uL (ref 4.0–10.5)
nRBC: 0 % (ref 0.0–0.2)

## 2022-06-09 LAB — COMPREHENSIVE METABOLIC PANEL
ALT: 15 U/L (ref 0–44)
AST: 25 U/L (ref 15–41)
Albumin: 3.7 g/dL (ref 3.5–5.0)
Alkaline Phosphatase: 46 U/L (ref 38–126)
Anion gap: 3 — ABNORMAL LOW (ref 5–15)
BUN: 18 mg/dL (ref 8–23)
CO2: 26 mmol/L (ref 22–32)
Calcium: 8.8 mg/dL — ABNORMAL LOW (ref 8.9–10.3)
Chloride: 111 mmol/L (ref 98–111)
Creatinine, Ser: 1.1 mg/dL — ABNORMAL HIGH (ref 0.44–1.00)
GFR, Estimated: 53 mL/min — ABNORMAL LOW (ref >60)
Glucose, Bld: 105 mg/dL — ABNORMAL HIGH (ref 70–99)
Potassium: 4 mmol/L (ref 3.5–5.1)
Sodium: 140 mmol/L (ref 135–145)
Total Bilirubin: 1.2 mg/dL (ref 0.3–1.2)
Total Protein: 6.4 g/dL — ABNORMAL LOW (ref 6.5–8.1)

## 2022-06-09 MED ORDER — HEPARIN SOD (PORK) LOCK FLUSH 100 UNIT/ML IV SOLN
500.0000 [IU] | Freq: Once | INTRAVENOUS | Status: AC | PRN
  Administered 2022-06-09: 500 [IU]
  Filled 2022-06-09: qty 5

## 2022-06-09 MED ORDER — ZOLEDRONIC ACID 4 MG/100ML IV SOLN
4.0000 mg | Freq: Once | INTRAVENOUS | Status: AC
  Administered 2022-06-09: 4 mg via INTRAVENOUS
  Filled 2022-06-09: qty 100

## 2022-06-09 MED ORDER — SODIUM CHLORIDE 0.9 % IV SOLN
Freq: Once | INTRAVENOUS | Status: AC
  Filled 2022-06-09: qty 250

## 2022-06-09 NOTE — Progress Notes (Signed)
Hematology/Oncology Progress note [phone] [phone]      Patient Care Team: Gayland Curry, MD as PCP - General (Family Medicine) Noreene Filbert, MD as Consulting Physician (Radiation Oncology)    ASSESSMENT & PLAN:   Cancer Staging  Primary malignant neoplasm of breast with metastasis [facility]) Staging form: Breast, AJCC 8th Edition - Clinical stage from 07/12/2021: Stage IV (rcTX, cNX, pM1, ER+, PR+, HER2-) - Signed by [name], MD on 03/08/2022   Primary malignant neoplasm of breast with metastasis ([place]) Metastatic Breast cancer with extensive thoracic and bone involvement, in visceral crisis, ER+, PR+ HER2 neg CT scan was independently reviewed by me and discussed with Patient.  Stable disease. Labs reviewed and discussed with patient. Continue Abemaciclib 100 mg twice daily plus letrozole 2.$RemoveBeforeDE'5mg'JPVOPPEpvHwdetC$  daily. CT scan results were reviewed and discussed with patient.  Stable disease. Discussed with patient that bilateral screening mammogram may not be beneficial to her as she has stage IV metastatic breast cancer.  Now she is on Eliquis 5 mg twice daily for atrial fibrillation.  Patient will have increased bleeding risk.  Close monitor.   Encounter for antineoplastic chemotherapy Chemotherapy plan as listed above  Chemotherapy-induced neuropathy (HCC) #Grade 2 neuropathy, continue gabapentin,  Continue gabapentin 600 mg in a.m., 900 mg at bedtime.   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 Zometa Q3 months.  bone scan result reviewed. Will obtain right pelvis/femur xray Discussed about option of palliative radiation.  Patient would like to defer given that symptom is manageable.  A-fib (HCC) Intermittent A-fib, now on Eliquis 5 mg twice daily. Abemaciclib is associated with <5% risk of A-fib. Patient has increased risk of bleeding.  Close  monitor.   Continue follow-up with cardiology. Orders Placed This Encounter  Procedures   DG FEMUR, MIN 2 VIEWS RIGHT    Standing Status:   Future    Number of Occurrences:   1    Standing Expiration Date:   06/10/2023    Order Specific Question:   Reason for Exam (SYMPTOM  OR DIAGNOSIS REQUIRED)    Answer:   right femur pain    Order Specific Question:   Preferred imaging location?    Answer:   [facility]   CBC with Differential/Platelet    Standing Status:   Future    Standing Expiration Date:   06/10/2023   Comprehensive metabolic panel    Standing Status:   Future    Standing Expiration Date:   06/09/2023   Cancer antigen 15-3    Standing Status:   Future    Standing Expiration Date:   06/10/2023   Cancer antigen 27.29    Standing Status:   Future    Standing Expiration Date:   06/10/2023   Follow up 4 weeks All questions were answered. The patient knows to call the clinic with any problems, questions or concerns.  [name], MD, PhD [facility] Hematology Oncology 06/09/2022         CHIEF COMPLAINTS/REASON FOR VISIT:  metastatic breast caner  HISTORY OF PRESENTING ILLNESS:   Amber Blevins is a  71 y.o.  female with PMH listed below presents for follow up for treatment of metastatic breast cancer.   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 Additional  metastatic involvement T12, L1 through L3.  No primary tumor site identified. Top differential considerations are Metastatic Disease Unknown primary, less likely Lymphoma or Multiple Myeloma.   2. Superimposed lumbar spine degeneration, including degenerative moderate to severe left L3 and L4 nerve level impingement from disc herniation.      07/02/2021 Imaging   CT chest abdomen pelvis showed innumerable small pulmonary and pleural nodules consistent with diffuse metastasis.  Associated with probable malignant pleural effusion.  Mediastinal and hilar lymphadenopathy consistent with metastatic disease.  Hepatic and bilateral adrenal gland metastasis.  Diffuse extensive destructive metastatic bone disease involving pelvis.   07/12/2021 Cancer Staging   Staging form: Breast, AJCC 8th Edition - Clinical stage from 07/12/2021: Stage IV (rcTX, cNX, pM1, ER+, PR+, HER2-) - Signed by [name], MD on 03/08/2022 Stage prefix: Recurrence   07/17/2021 Initial Diagnosis   Metastatic breast cancer -history of breast cancer, diagnosed in 2010 Left-sided T2 (2.4cm) N0, ER/PR positive, HER2 negative IDC of the breast, s/p lumpectomy by Dr[facility]Meyer at [facility]. Oncotype score of 11 who completed radiation and she finished 10 years of Femara from 05/2009 and stopped in 2020.  -07/12/2021 patient underwent right thoracentesis.  Cytology was positive for metastatic carcinoma, compatible with breast origin.  ER/PR +, HER2 negative -07/16/2021, patient underwent iliac bone biopsy.  Positive for metastatic carcinoma.   07/21/2021 - 09/16/2021 Chemotherapy   Patient is on Treatment Plan : BREAST Paclitaxel D1,8,15 q28d     09/04/2021 Imaging   CT chest abdomen pelvis showed improvement of the pulmonary nodularity and thickening in the left and the right lung.  Persistent nodular interstitial and pleural thickening remains.  Improvement of mediastinal and hilar adenopathy, hepatic metastatic lesions are similar.  Adrenal gland  metastasis is similar.  Interval increase in the sclerotic bone metastasis.    09/30/2021 -  Chemotherapy   started on abemaciclib 100 mg twice daily and letrozole.   02/24/2022 Imaging    CT chest abdomen pelvis with contrast showed unchanged bilateral pleural effusion with associated intralobular septal thickening of the lung bases.  Without discrete nodularity consistent with stable appearance of treated pleural and lymphangitic metastatic disease.  Stable or minimally diminished subcentimeter liver lesions.  Unchanged left adrenal metastasis.  Unchanged widespread sclerotic osseous metastatic disease involving the included axial and appendicular skeleton.  No evidence of new metastatic disease in the chest abdomen or pelvis.  Coronary artery disease.   03/06/2022 Procedure   Ultrasound guided thoracentesis, removal of 824ml pleural fluid.    05/29/2022 Imaging   CT chest abdomen pelvis 1. Stable CT of the chest, abdomen and pelvis. 2. Unchanged appearance of bilateral pleural effusions, pleural nodularity, and interlobular septal thickening compatible with pleural and lymphangitic metastatic disease. 3. Stable appearance of multifocal sclerotic bone metastases. 4. Multifocal low-attenuation liver lesions are unchanged in the interval. 5. Stable appearance of left adrenal gland metastases. 6. Stable appearance of small pericardial effusion.7. No new or progressive disease identified.8. Aortic Atherosclerosis (ICD10-I70.0).   06/06/2022 Imaging   Bone scan Constellation of findings are consistent with multifocal osseous metastatic disease  throughout the axial and appendicular skeleton.Of note, osseous metastatic disease involves the RIGHT greater than LEFT femur. Consider evaluation with dedicated hip and femur radiographs to evaluate for overall disease burden and potential risk for pathologic fracture.    Today she feels well.  Mild shortness of breath, unchanged.   Patient follows up with  cardiology for evaluation of bradycardia.  Cardiology notes reviewed.  72-hour Holter monitor revealed revealed predominant sinus rhythm with mean heart rate of 96 bpm, heart rate range 53 to 129 bpm, frequent premature ventricular contractions (27% burden), and intermittent atrial fibrillation (5% burden) with longest episode 3 hours. CHA2DS2-VASc score of 3, per cardiology recommendation patient was started on Eliquis 5 mg twice daily.  She has follow-up appointment with cardiology in 3  She has bilateral screening mammogram scheduled, ordered by primary care provider.  She feels there is tissue thickening of the left breast.  Patient continues to have some right buttock discomfort. Review of Systems  Constitutional:  Negative for appetite change, chills, fatigue and fever.  HENT:   Negative for hearing loss and voice change.   Eyes:  Negative for eye problems.  Respiratory:  Positive for shortness of breath. Negative for chest tightness and cough.   Cardiovascular:  Negative for chest pain.  Gastrointestinal:  Negative for abdominal distention, abdominal pain and blood in stool.  Endocrine: Negative for hot flashes.  Genitourinary:  Negative for difficulty urinating and frequency.   Musculoskeletal:  Negative for arthralgias.  Skin:  Negative for itching and rash.  Neurological:  Positive for numbness. Negative for extremity weakness.  Hematological:  Negative for adenopathy.  Psychiatric/Behavioral:  Negative for confusion.      MEDICAL HISTORY:  Past Medical History:  Diagnosis Date   A-fib ([place])    Breast cancer (HCC)    COPD (chronic obstructive pulmonary disease) ([place])    Family history of adverse reaction to anesthesia    brother has problem with coming out of anesthesia   High cholesterol    Hypertension    Personal history of radiation therapy    Pre-diabetes     SURGICAL HISTORY: Past Surgical History:  Procedure Laterality Date   BREAST LUMPECTOMY Left    2010    COLONOSCOPY     EYE SURGERY Left    Cataract surgery   PORTACATH PLACEMENT N/A 08/01/2021   Procedure: INSERTION PORT-A-CATH;  Surgeon: Herbert Pun, MD;  Location: [facility] ORS;  Service: General;  Laterality: N/A;   thorocentesis Right 07/10/2021   and another on 07/25/21    SOCIAL HISTORY: Social History   Socioeconomic History   Marital status: Married    Spouse name: Not on file   Number of children: Not on file   Years of education: Not on file   Highest education level: Not on file  Occupational History   Not on file  Tobacco Use   Smoking status: Former    Packs/day: 1.00    Years: 25.00    Total pack years: 25.00    Types: Cigarettes    Quit date: 19    Years since quitting: 33.7   Smokeless tobacco: Never  Vaping Use   Vaping Use: Never used  Substance and Sexual Activity   Alcohol use: Not Currently   Drug use: Never   Sexual activity: Not on file  Other Topics Concern   Not on file  Social History Narrative   Not on file   Social Determinants of Health   Financial Resource Strain: Not on file  Food Insecurity: Not on file  Transportation Needs: Not on file  Physical Activity: Not on file  Stress: Not on file  Social Connections: Not on file  Intimate Partner Violence: Not on file    FAMILY HISTORY: Family History  Problem Relation Age of Onset   Cancer Mother        gynecological   Lung cancer Mother    Diabetes Father    Heart disease Father    Parkinson's disease Father    Brain cancer Brother    Bladder Cancer Brother    Pulmonary disease Brother    Rheumatic fever Brother    Breast cancer Neg Hx     ALLERGIES:  is allergic to green tea (camellia sinensis), melaleuca viridiflora, tea tree oil, and lisinopril.  MEDICATIONS:  Current Outpatient Medications  Medication Sig Dispense Refill   abemaciclib (VERZENIO) 100 MG tablet Take 1 tablet (100 mg total) by mouth 2 (two) times daily. Swallow tablets whole. Do not chew, crush,  or split tablets before swallowing. 56 tablet 3   acetaminophen (TYLENOL) 650 MG CR tablet Take 650 mg by mouth every 8 (eight) hours as needed for pain.     atorvastatin (LIPITOR) 40 MG tablet Take 40 mg by mouth at bedtime.     Boswellia-Glucosamine-Vit D (OSTEO BI-FLEX-GLUCOS/5-LOXIN) TABS Take 1 capsule by mouth in the morning and at bedtime.     Calcium Carb-Cholecalciferol 600-400 MG-UNIT TABS Take 1 tablet by mouth daily.     cyanocobalamin 1000 MCG tablet Take 1,000 mcg by mouth daily.     ELIQUIS 5 MG TABS tablet Take 5 mg by mouth 2 (two) times daily.     gabapentin (NEURONTIN) 300 MG capsule Take 1 capsule (300 mg total) by mouth See admin instructions. Take 2 tablets in morning and take 3 tablets in evening. 150 capsule 2   letrozole (FEMARA) 2.5 MG tablet Take 1 tablet (2.5 mg total) by mouth daily. 90 tablet 2   lidocaine-prilocaine (EMLA) cream Apply 1 application. topically as needed. 30 g 5   losartan (COZAAR) 50 MG tablet Take 50 mg by mouth daily.     magnesium chloride (SLOW-MAG) 64 MG TBEC SR tablet Take 1 tablet by mouth at bedtime.     melatonin (MELATONIN MAXIMUM STRENGTH) 5 MG TABS Take 1 tablet by mouth at bedtime.     Turmeric (QC TUMERIC COMPLEX PO) Take 1 capsule by mouth at bedtime.     aspirin EC 81 MG tablet Take by mouth. (Patient not taking: Reported on 06/09/2022)     diphenoxylate-atropine (LOMOTIL) 2.5-0.025 MG tablet Take 1 tablet by mouth 4 (four) times daily as needed for diarrhea or loose stools. (Patient not taking: Reported on 04/28/2022) 30 tablet 0   Docusate Sodium (DSS) 100 MG CAPS Take 1 capsule by mouth every other day. (Patient not taking: Reported on 06/09/2022)     Loperamide HCl (IMODIUM PO) Take by mouth. (Patient not taking: Reported on 04/28/2022)     LORazepam (ATIVAN) 0.5 MG tablet TAKE 1 TABLET BY MOUTH AT BEDTIME AS NEEDED FOR ANXIETY (Patient not taking: Reported on 06/09/2022) 30 tablet 0   ondansetron (ZOFRAN) 8 MG tablet Take by mouth every  8 (eight) hours as needed for nausea or vomiting. (Patient not taking: Reported on 06/09/2022)     potassium chloride SA (KLOR-CON M) 20 MEQ tablet Take 1 tablet (20 mEq total) by mouth daily. (Patient not taking: Reported on 04/28/2022) 30 tablet 0   prochlorperazine (COMPAZINE) 5 MG tablet  Take 5 mg by mouth every 6 (six) hours as needed for nausea or vomiting. (Patient not taking: Reported on 06/09/2022)     No current facility-administered medications for this visit.     PHYSICAL EXAMINATION: ECOG PERFORMANCE STATUS: 1 - Symptomatic but completely ambulatory Vitals:   06/09/22 0920  BP: 129/71  Pulse: (!) 48  Resp: 18  SpO2: 100%   Filed Weights   06/09/22 0918  Weight: 177 lb (80.3 kg)    Physical Exam Constitutional:      General: She is not in acute distress.    Appearance: She is not diaphoretic.  HENT:     Head: Normocephalic and atraumatic.     Nose: Nose normal.     Mouth/Throat:     Pharynx: No oropharyngeal exudate.  Eyes:     General: No scleral icterus.    Pupils: Pupils are equal, round, and reactive to light.  Cardiovascular:     Rate and Rhythm: Normal rate and regular rhythm.     Heart sounds: No murmur heard. Pulmonary:     Effort: Pulmonary effort is normal. No respiratory distress.     Comments: Decreased breath sound bilaterally. Abdominal:     General: There is no distension.     Palpations: Abdomen is soft.     Tenderness: There is no abdominal tenderness.  Musculoskeletal:        General: Normal range of motion.     Cervical back: Normal range of motion and neck supple.  Skin:    General: Skin is warm and dry.     Findings: No erythema.  Neurological:     Mental Status: She is alert and oriented to person, place, and time.     Cranial Nerves: No cranial nerve deficit.     Motor: No abnormal muscle tone.     Coordination: Coordination normal.  Psychiatric:        Mood and Affect: Affect normal.    Bilateral breast examination was  performed.  No palpable discrete mass in either breast.   LABORATORY DATA:  I have reviewed the data as listed     Latest Ref Rng & Units 06/09/2022    8:50 AM 04/28/2022   10:26 AM 04/03/2022   10:02 AM  CBC  WBC 4.0 - 10.5 K/uL 4.1  3.7  3.7   Hemoglobin 12.0 - 15.0 g/dL 11.2  12.4  12.3   Hematocrit 36.0 - 46.0 % 34.3  36.9  37.3   Platelets 150 - 400 K/uL 198  205  197       Latest Ref Rng & Units 06/09/2022    8:50 AM 04/28/2022   10:26 AM 04/03/2022   10:02 AM  CMP  Glucose 70 - 99 mg/dL 105  102  102   BUN 8 - 23 mg/dL _0 Creatinine 0.44 - 1.00 mg/dL 1.10  0.85  0.93   Sodium 135 - 145 mmol/L 140  138  137   Potassium 3.5 - 5.1 mmol/L 4.0  4.0  4.3   Chloride 98 - 111 mmol/L 111  104  103   CO2 22 - 32 mmol/L _1 Calcium 8.9 - 10.3 mg/dL 8.8  9.3  8.9   Total Protein 6.5 - 8.1 g/dL 6.4  6.7  6.5   Total Bilirubin 0.3 - 1.2 mg/dL 1.2  1.2  1.4   Alkaline Phos 38 - 126 U/L 46  45  48  AST 15 - 41 U/L _0 ALT 0 - 44 U/L _1 RADIOGRAPHIC STUDIES: I have personally reviewed the radiological images as listed and agreed with the findings in the report. NM Bone Scan Whole Body  Result Date: 06/06/2022 CLINICAL DATA:  breast cancer, AI use. Known osseous metastatic disease. EXAM: NUCLEAR MEDICINE WHOLE BODY BONE SCAN TECHNIQUE: Whole body anterior and posterior images were obtained approximately 3 hours after intravenous injection of radiopharmaceutical. RADIOPHARMACEUTICALS:  20.01 mCi Technetium-69mMDP IV COMPARISON:  CT dated Feb 24, 2022. FINDINGS: There is focal increased radiotracer uptake throughout the axial and appendicular skeleton. There is increased radiotracer uptake in the sternum, manubrium, and RIGHT medial clavicle consistent with known osseous metastatic disease. There is increased radiotracer uptake throughout multiple levels of the thoracic and lumbar spine. Increased radiotracer uptake is seen most predominately in the  inferior cervical spine, approximate T3, T5, T7 and T10 vertebral bodies. There is confluent radiotracer uptake throughout the lower lumbar spine and throughout the sacrum consistent with known confluent osseous metastatic disease. This extends across bilateral sacroiliac joints, RIGHT greater than LEFT. There is focal increased radiotracer uptake of the RIGHT ischium and RIGHT acetabulum. There is focally increased radiotracer uptake in the RIGHT femoral head and in the mid femoral shaft. There is faint increased radiotracer uptake within the LEFT mid femoral shaft and femoral head. There is increased radiotracer uptake throughout several bilateral ribs, including the LEFT posterior approximate eighth rib, RIGHT anterolateral approximate tenth rib, LEFT anterolateral approximate ninth rib. There is radiotracer uptake within the bilateral shoulders, knees and feet. This is favored to be degenerative in etiology. Expected physiologic distribution of radiotracer. IMPRESSION: Constellation of findings are consistent with multifocal osseous metastatic disease throughout the axial and appendicular skeleton. Of note, osseous metastatic disease involves the RIGHT greater than LEFT femur. Consider evaluation with dedicated hip and femur radiographs to evaluate for overall disease burden and potential risk for pathologic fracture. Electronically Signed   By: SValentino SaxonM.D.   On: 06/06/2022 11:26   CT CHEST ABDOMEN PELVIS W CONTRAST  Result Date: 05/31/2022 CLINICAL DATA:  Restaging metastatic breast cancer. EXAM: CT CHEST, ABDOMEN, AND PELVIS WITH CONTRAST TECHNIQUE: Multidetector CT imaging of the chest, abdomen and pelvis was performed following the standard protocol during bolus administration of intravenous contrast. RADIATION DOSE REDUCTION: This exam was performed according to the departmental dose-optimization program which includes automated exposure control, adjustment of the mA and/or kV according to  patient size and/or use of iterative reconstruction technique. body oncCONTRAST:  1027mOMNIPAQUE IOHEXOL 300 MG/ML  SOLN COMPARISON:  02/24/2022 FINDINGS: CT CHEST FINDINGS Cardiovascular: Heart size is normal. There is a small pericardial effusion which appears unchanged from previous exam. Aortic atherosclerosis and coronary artery calcifications. Mediastinum/Nodes: No enlarged mediastinal, hilar, or axillary lymph nodes. Thyroid gland, trachea, and esophagus demonstrate no significant findings. Lungs/Pleura: Stable small bilateral pleural effusions, right greater than left. Unchanged appearance of bilateral interlobular septal thickening and paramediastinal fibrotic changes. Nodularity along the interlobar fissures is identified bilaterally in appears unchanged compared with the previous exam. No suspicious pulmonary parenchymal nodule or mass identified on today's exam. Musculoskeletal: Multifocal sclerotic bone metastases are again noted involving the sternum, lower cervical spine, and thoracic spine. There also scattered sclerotic rib lesions noted bilaterally which are also unchanged in the interval. Unchanged mild, chronic endplate deformities are identified involving the T7 and T10 vertebra. CT ABDOMEN PELVIS FINDINGS Hepatobiliary:  Low-density lesion within segment 8/4 is unchanged measuring 8 mm, image 46/2. Ill-defined low-density lesion within segment 4 B is unchanged measuring 1.1 cm, image 52/2. Stable 8 mm lesion within segment 5, image 61/2. Gallbladder appears normal.  No bile duct dilatation. Pancreas: Unremarkable. No pancreatic ductal dilatation or surrounding inflammatory changes. Spleen: Normal in size without focal abnormality. Adrenals/Urinary Tract: Normal right adrenal gland. Nodule in the left adrenal gland is stable measuring 1.8 by 1.1 cm, image 56/2. No nephrolithiasis, hydronephrosis or suspicious mass. Urinary bladder is unremarkable. Stomach/Bowel: Stomach is nondistended. Colonic  diverticulosis. No bowel wall thickening, inflammation, or distension identified. Vascular/Lymphatic: Aortic atherosclerosis. No aneurysm. No signs of abdominopelvic adenopathy. Reproductive: Uterus and bilateral adnexa are unremarkable. Other: No ascites or focal fluid collections. Musculoskeletal: Multifocal sclerotic bone metastases are again noted involving the proximal appendicular and axial skeleton. Lesions appear similar and multiplicity and size compared with the previous exam. No acute findings. IMPRESSION: 1. Stable CT of the chest, abdomen and pelvis. 2. Unchanged appearance of bilateral pleural effusions, pleural nodularity, and interlobular septal thickening compatible with pleural and lymphangitic metastatic disease. 3. Stable appearance of multifocal sclerotic bone metastases. 4. Multifocal low-attenuation liver lesions are unchanged in the interval. 5. Stable appearance of left adrenal gland metastases. 6. Stable appearance of small pericardial effusion. 7. No new or progressive disease identified. 8. Aortic Atherosclerosis (ICD10-I70.0). Electronically Signed   By: Kerby Moors M.D.   On: 05/31/2022 08:54

## 2022-06-09 NOTE — Assessment & Plan Note (Signed)
Intermittent A-fib, now on Eliquis 5 mg twice daily. Abemaciclib is associated with <5% risk of A-fib. Patient has increased risk of bleeding.  Close monitor.   Continue follow-up with cardiology. 

## 2022-06-09 NOTE — Assessment & Plan Note (Addendum)
Metastatic Breast cancer with extensive thoracic and bone involvement, in visceral crisis, ER+, PR+ HER2 neg CT scan was independently reviewed by me and discussed with Patient.  Stable disease. Labs reviewed and discussed with patient. Continue Abemaciclib 100 mg twice daily plus letrozole 2.58m daily. CT scan results were reviewed and discussed with patient.  Stable disease. Discussed with patient that bilateral screening mammogram may not be beneficial to her as she has stage IV metastatic breast cancer.  Now she is on Eliquis 5 mg twice daily for atrial fibrillation.  Patient will have increased bleeding risk.  Close monitor.

## 2022-06-09 NOTE — Assessment & Plan Note (Signed)
#  Grade 2 neuropathy, continue gabapentin,  Continue gabapentin 600 mg in a.m., 900 mg at bedtime.  

## 2022-06-09 NOTE — Assessment & Plan Note (Addendum)
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 Zometa Q3 months.  bone scan result reviewed. Will obtain right pelvis/femur xray Discussed about option of palliative radiation.  Patient would like to defer given that symptom is manageable.

## 2022-06-09 NOTE — Assessment & Plan Note (Signed)
Chemotherapy plan as listed above 

## 2022-06-09 NOTE — Progress Notes (Signed)
Patient reports one episode of diarrhea, a diagnosis of AFIB and PVC's, SOB on exertion.

## 2022-06-10 LAB — CANCER ANTIGEN 15-3: CA 15-3: 27.9 U/mL — ABNORMAL HIGH (ref 0.0–25.0)

## 2022-06-10 LAB — CANCER ANTIGEN 27.29: CA 27.29: 33.9 U/mL (ref 0.0–38.6)

## 2022-06-16 ENCOUNTER — Ambulatory Visit
Admission: RE | Admit: 2022-06-16 | Discharge: 2022-06-16 | Disposition: A | Discharge status: Home / Self Care | Payer: Medicare PPO | Attending: Physician Assistant | Admitting: Physician Assistant

## 2022-06-16 ENCOUNTER — Ambulatory Visit
Admission: RE | Admit: 2022-06-16 | Discharge: 2022-06-16 | Disposition: A | Discharge status: Home / Self Care | Payer: Medicare PPO | Source: Ambulatory Visit | Attending: Physician Assistant | Admitting: Physician Assistant

## 2022-06-16 ENCOUNTER — Other Ambulatory Visit: Payer: Self-pay | Admitting: Physician Assistant

## 2022-06-16 DIAGNOSIS — S99911A Unspecified injury of right ankle, initial encounter: Secondary | ICD-10-CM

## 2022-06-23 ENCOUNTER — Ambulatory Visit
Admission: RE | Admit: 2022-06-23 | Discharge: 2022-06-23 | Disposition: A | Discharge status: Home / Self Care | Payer: Medicare PPO | Source: Ambulatory Visit | Attending: Family Medicine | Admitting: Family Medicine

## 2022-06-23 ENCOUNTER — Inpatient Hospital Stay: Payer: Medicare PPO

## 2022-06-23 DIAGNOSIS — Z1231 Encounter for screening mammogram for malignant neoplasm of breast: Secondary | ICD-10-CM | POA: Insufficient documentation

## 2022-06-25 ENCOUNTER — Other Ambulatory Visit (HOSPITAL_COMMUNITY): Payer: Self-pay

## 2022-06-30 ENCOUNTER — Other Ambulatory Visit (HOSPITAL_COMMUNITY): Payer: Self-pay

## 2022-06-30 ENCOUNTER — Other Ambulatory Visit: Payer: Self-pay | Admitting: Physician Assistant

## 2022-06-30 DIAGNOSIS — R928 Other abnormal and inconclusive findings on diagnostic imaging of breast: Secondary | ICD-10-CM

## 2022-06-30 DIAGNOSIS — R921 Mammographic calcification found on diagnostic imaging of breast: Secondary | ICD-10-CM

## 2022-07-01 ENCOUNTER — Other Ambulatory Visit (HOSPITAL_COMMUNITY): Payer: Self-pay

## 2022-07-06 ENCOUNTER — Ambulatory Visit
Admission: RE | Admit: 2022-07-06 | Discharge: 2022-07-06 | Disposition: A | Discharge status: Home / Self Care | Payer: Medicare PPO | Source: Ambulatory Visit | Attending: Physician Assistant | Admitting: Physician Assistant

## 2022-07-06 DIAGNOSIS — R928 Other abnormal and inconclusive findings on diagnostic imaging of breast: Secondary | ICD-10-CM | POA: Diagnosis present

## 2022-07-06 DIAGNOSIS — R921 Mammographic calcification found on diagnostic imaging of breast: Secondary | ICD-10-CM | POA: Diagnosis present

## 2022-07-13 ENCOUNTER — Encounter: Payer: Self-pay | Admitting: Oncology

## 2022-07-13 ENCOUNTER — Inpatient Hospital Stay: Payer: Medicare PPO | Attending: Oncology

## 2022-07-13 ENCOUNTER — Inpatient Hospital Stay: Payer: Medicare PPO | Admitting: Oncology

## 2022-07-13 VITALS — BP 141/78 | HR 90 | Temp 96.8°F | Resp 18 | Wt 177.1 lb

## 2022-07-13 DIAGNOSIS — Z170 Estrogen receptor positive status [ER+]: Secondary | ICD-10-CM | POA: Diagnosis not present

## 2022-07-13 DIAGNOSIS — C50919 Malignant neoplasm of unspecified site of unspecified female breast: Secondary | ICD-10-CM

## 2022-07-13 DIAGNOSIS — C7971 Secondary malignant neoplasm of right adrenal gland: Secondary | ICD-10-CM | POA: Insufficient documentation

## 2022-07-13 DIAGNOSIS — Z923 Personal history of irradiation: Secondary | ICD-10-CM | POA: Insufficient documentation

## 2022-07-13 DIAGNOSIS — Z803 Family history of malignant neoplasm of breast: Secondary | ICD-10-CM | POA: Insufficient documentation

## 2022-07-13 DIAGNOSIS — J449 Chronic obstructive pulmonary disease, unspecified: Secondary | ICD-10-CM | POA: Diagnosis not present

## 2022-07-13 DIAGNOSIS — E7800 Pure hypercholesterolemia, unspecified: Secondary | ICD-10-CM | POA: Insufficient documentation

## 2022-07-13 DIAGNOSIS — I3139 Other pericardial effusion (noninflammatory): Secondary | ICD-10-CM | POA: Diagnosis not present

## 2022-07-13 DIAGNOSIS — C787 Secondary malignant neoplasm of liver and intrahepatic bile duct: Secondary | ICD-10-CM | POA: Diagnosis not present

## 2022-07-13 DIAGNOSIS — I10 Essential (primary) hypertension: Secondary | ICD-10-CM | POA: Insufficient documentation

## 2022-07-13 DIAGNOSIS — I4891 Unspecified atrial fibrillation: Secondary | ICD-10-CM

## 2022-07-13 DIAGNOSIS — Z7901 Long term (current) use of anticoagulants: Secondary | ICD-10-CM | POA: Insufficient documentation

## 2022-07-13 DIAGNOSIS — Z5111 Encounter for antineoplastic chemotherapy: Secondary | ICD-10-CM

## 2022-07-13 DIAGNOSIS — Z87891 Personal history of nicotine dependence: Secondary | ICD-10-CM | POA: Insufficient documentation

## 2022-07-13 DIAGNOSIS — C7951 Secondary malignant neoplasm of bone: Secondary | ICD-10-CM | POA: Diagnosis not present

## 2022-07-13 DIAGNOSIS — I2510 Atherosclerotic heart disease of native coronary artery without angina pectoris: Secondary | ICD-10-CM | POA: Diagnosis not present

## 2022-07-13 DIAGNOSIS — Z79811 Long term (current) use of aromatase inhibitors: Secondary | ICD-10-CM | POA: Diagnosis not present

## 2022-07-13 DIAGNOSIS — Z801 Family history of malignant neoplasm of trachea, bronchus and lung: Secondary | ICD-10-CM | POA: Diagnosis not present

## 2022-07-13 DIAGNOSIS — Z79899 Other long term (current) drug therapy: Secondary | ICD-10-CM | POA: Diagnosis not present

## 2022-07-13 DIAGNOSIS — C50912 Malignant neoplasm of unspecified site of left female breast: Secondary | ICD-10-CM | POA: Insufficient documentation

## 2022-07-13 DIAGNOSIS — C7800 Secondary malignant neoplasm of unspecified lung: Secondary | ICD-10-CM | POA: Insufficient documentation

## 2022-07-13 DIAGNOSIS — C7972 Secondary malignant neoplasm of left adrenal gland: Secondary | ICD-10-CM | POA: Diagnosis not present

## 2022-07-13 LAB — CBC WITH DIFFERENTIAL/PLATELET
Abs Immature Granulocytes: 0.01 10*3/uL (ref 0.00–0.07)
Basophils Absolute: 0 10*3/uL (ref 0.0–0.1)
Basophils Relative: 1 %
Eosinophils Absolute: 0.1 10*3/uL (ref 0.0–0.5)
Eosinophils Relative: 2 %
HCT: 37.2 % (ref 36.0–46.0)
Hemoglobin: 12.5 g/dL (ref 12.0–15.0)
Immature Granulocytes: 0 %
Lymphocytes Relative: 23 %
Lymphs Abs: 0.9 10*3/uL (ref 0.7–4.0)
MCH: 32.7 pg (ref 26.0–34.0)
MCHC: 33.6 g/dL (ref 30.0–36.0)
MCV: 97.4 fL (ref 80.0–100.0)
Monocytes Absolute: 0.3 10*3/uL (ref 0.1–1.0)
Monocytes Relative: 8 %
Neutro Abs: 2.7 10*3/uL (ref 1.7–7.7)
Neutrophils Relative %: 66 %
Platelets: 187 10*3/uL (ref 150–400)
RBC: 3.82 MIL/uL — ABNORMAL LOW (ref 3.87–5.11)
RDW: 13.9 % (ref 11.5–15.5)
WBC: 4.1 10*3/uL (ref 4.0–10.5)
nRBC: 0 % (ref 0.0–0.2)

## 2022-07-13 LAB — COMPREHENSIVE METABOLIC PANEL
ALT: 14 U/L (ref 0–44)
AST: 26 U/L (ref 15–41)
Albumin: 4.1 g/dL (ref 3.5–5.0)
Alkaline Phosphatase: 49 U/L (ref 38–126)
Anion gap: 8 (ref 5–15)
BUN: 16 mg/dL (ref 8–23)
CO2: 25 mmol/L (ref 22–32)
Calcium: 9.4 mg/dL (ref 8.9–10.3)
Chloride: 104 mmol/L (ref 98–111)
Creatinine, Ser: 1 mg/dL (ref 0.44–1.00)
GFR, Estimated: 60 mL/min — ABNORMAL LOW (ref >60)
Glucose, Bld: 82 mg/dL (ref 70–99)
Potassium: 4.4 mmol/L (ref 3.5–5.1)
Sodium: 137 mmol/L (ref 135–145)
Total Bilirubin: 1.1 mg/dL (ref 0.3–1.2)
Total Protein: 7.1 g/dL (ref 6.5–8.1)

## 2022-07-13 NOTE — Assessment & Plan Note (Signed)
Intermittent A-fib, now on Eliquis 5 mg twice daily. Abemaciclib is associated with <5% risk of A-fib. Patient has increased risk of bleeding.  Close monitor.   Continue follow-up with cardiology. 

## 2022-07-13 NOTE — Assessment & Plan Note (Addendum)
Metastatic Breast cancer with extensive thoracic and bone involvement, in visceral crisis, ER+, PR+ HER2 neg CT scan was independently reviewed by me and discussed with Patient.  Stable disease. Labs reviewed and discussed with patient. Continue Abemaciclib 100 mg twice daily plus letrozole 2.77m daily. CT scan -stable. Bone scan showed stable metastatic bone disease.

## 2022-07-13 NOTE — Progress Notes (Signed)
Pt here for follow up. No new concerns voiced.   

## 2022-07-13 NOTE — Assessment & Plan Note (Signed)
Chemotherapy plan as listed above 

## 2022-07-13 NOTE — Assessment & Plan Note (Signed)
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 Zometa Q3 months.  

## 2022-07-13 NOTE — Progress Notes (Signed)
Hematology/Oncology Progress note [phone] [phone]      Patient Care Team: Gayland Curry, MD as PCP - General (Family Medicine) Noreene Filbert, MD as Consulting Physician (Radiation Oncology)    ASSESSMENT & PLAN:   Cancer Staging  Primary malignant neoplasm of breast with metastasis [facility]) Staging form: Breast, AJCC 8th Edition - Clinical stage from 07/12/2021: Stage IV (rcTX, cNX, pM1, ER+, PR+, HER2-) - Signed by [name], MD on 03/08/2022   Primary malignant neoplasm of breast with metastasis ([place]) Metastatic Breast cancer with extensive thoracic and bone involvement, in visceral crisis, ER+, PR+ HER2 neg CT scan was independently reviewed by me and discussed with Patient.  Stable disease. Labs reviewed and discussed with patient. Continue Abemaciclib 100 mg twice daily plus letrozole 2.66m daily. CT scan -stable. Bone scan showed stable metastatic bone disease.    Encounter for antineoplastic chemotherapy Chemotherapy plan as listed above   A-fib (HCC) Intermittent A-fib, now on Eliquis 5 mg twice daily. Abemaciclib is associated with <5% risk of A-fib. Patient has increased risk of bleeding.  Close monitor.   Continue follow-up with cardiology.  Metastasis to bone ([facility] 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 Zometa Q3 months.  Orders Placed This Encounter  Procedures   CT CHEST ABDOMEN PELVIS W CONTRAST    Standing Status:   Future    Standing Expiration Date:   07/14/2023    Order Specific Question:   If indicated for the ordered procedure, I authorize the administration of contrast media per Radiology protocol    Answer:   Yes    Order Specific Question:   Preferred imaging location?    Answer:   [facility]    Order Specific Question:   Is Oral Contrast requested for this exam?    Answer:   Yes, Per Radiology protocol   CBC with  Differential/Platelet    Standing Status:   Standing    Number of Occurrences:   2    Standing Expiration Date:   07/14/2023   Comprehensive metabolic panel    Standing Status:   Standing    Number of Occurrences:   2    Standing Expiration Date:   07/14/2023   Cancer antigen 15-3    Standing Status:   Standing    Number of Occurrences:   2    Standing Expiration Date:   07/14/2023   Cancer antigen 27.29    Standing Status:   Standing    Number of Occurrences:   2    Standing Expiration Date:   07/14/2023   Follow up 4 weeks All questions were answered. The patient knows to call the clinic with any problems, questions or concerns.  ZEarlie Server MD, PhD [facility] Oncology 07/13/2022         CHIEF COMPLAINTS/REASON FOR VISIT:  metastatic breast caner  HISTORY OF PRESENTING ILLNESS:   Amber HAGGARTis a  71y.o.  female with PMH listed below presents for follow up for treatment of metastatic breast cancer.   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 Extensive malignant tumor replacing  the bones of the lower lumbar vertebrae (L4 and L5), visible sacrum, and pelvis. 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 Additional metastatic involvement T12, L1 through L3.  No primary tumor site identified. Top differential considerations are Metastatic Disease Unknown primary, less likely Lymphoma or Multiple Myeloma.   2. Superimposed lumbar spine degeneration, including degenerative moderate to severe left L3 and L4 nerve  level impingement from disc herniation.      07/02/2021 Imaging   CT chest abdomen pelvis showed innumerable small pulmonary and pleural nodules consistent with diffuse metastasis.  Associated with probable malignant pleural effusion.  Mediastinal and hilar lymphadenopathy consistent with metastatic disease.  Hepatic and bilateral adrenal gland metastasis.  Diffuse extensive destructive metastatic bone disease involving pelvis.   07/12/2021 Cancer Staging   Staging form: Breast, AJCC 8th Edition - Clinical stage from 07/12/2021: Stage IV (rcTX, cNX, pM1, ER+, PR+, HER2-) - Signed by [name], MD on 03/08/2022 Stage prefix: Recurrence   07/17/2021 Initial Diagnosis   Metastatic breast cancer -history of breast cancer, diagnosed in 2010 Left-sided T2 (2.4cm) N0, ER/PR positive, HER2 negative IDC of the breast, s/p lumpectomy by Dr.Meyer at [facility]. Oncotype score of 11 who completed radiation and Amber Blevins finished 10 years of Femara from 05/2009 and stopped in 2020.  -07/12/2021 patient underwent right thoracentesis.  Cytology was positive for metastatic carcinoma, compatible with breast origin.  ER/PR +, HER2 negative -07/16/2021, patient underwent iliac bone biopsy.  Positive for metastatic carcinoma.   07/21/2021 - 09/16/2021 Chemotherapy   Patient is on Treatment Plan : BREAST Paclitaxel D1,8,15 q28d     09/04/2021 Imaging   CT chest abdomen pelvis showed improvement of the pulmonary nodularity and thickening in the left and the right lung.  Persistent nodular interstitial and pleural thickening remains.  Improvement of mediastinal and hilar adenopathy, hepatic metastatic lesions are similar.  Adrenal gland metastasis is similar.  Interval increase in the sclerotic bone metastasis.    09/30/2021 -  Chemotherapy   started on abemaciclib 100 mg twice daily and letrozole.   02/24/2022 Imaging    CT chest abdomen pelvis with contrast showed unchanged bilateral pleural effusion with associated intralobular  septal thickening of the lung bases.  Without discrete nodularity consistent with stable appearance of treated pleural and lymphangitic metastatic disease.  Stable or minimally diminished subcentimeter liver lesions.  Unchanged left adrenal metastasis.  Unchanged widespread sclerotic osseous metastatic disease involving the included axial and appendicular skeleton.  No evidence of new metastatic disease in the chest abdomen or pelvis.  Coronary artery disease.   03/06/2022 Procedure   Ultrasound guided thoracentesis, removal of 898m pleural fluid.    05/29/2022 Imaging   CT chest abdomen pelvis 1. Stable CT of the chest, abdomen and pelvis. 2. Unchanged appearance of bilateral pleural effusions, pleural nodularity, and interlobular septal thickening compatible with pleural and lymphangitic metastatic disease. 3. Stable appearance of multifocal sclerotic bone metastases. 4. Multifocal low-attenuation liver lesions are unchanged in the interval. 5. Stable appearance of left adrenal gland metastases. 6. Stable appearance of small pericardial effusion.7. No new or progressive disease identified.8. Aortic Atherosclerosis (ICD10-I70.0).   06/06/2022 Imaging   Bone scan Constellation of findings are consistent with multifocal osseous metastatic disease throughout the axial and appendicular skeleton.Of note, osseous metastatic disease involves the RIGHT greater than LEFT femur. Consider evaluation with dedicated hip and femur radiographs to evaluate for overall disease burden and potential  risk for pathologic fracture.    Patient follows up with cardiology for evaluation of bradycardia.  Cardiology notes reviewed.  72-hour Holter monitor revealed revealed predominant sinus rhythm with mean heart rate of 96 bpm, heart rate range 53 to 129 bpm, frequent premature ventricular contractions (27% burden), and intermittent atrial fibrillation (5% burden) with longest episode 3 hours. CHA2DS2-VASc score of 3, per  cardiology recommendation patient was started on Eliquis 5 mg twice daily.  Amber Blevins has follow-up appointment with cardiology in [place] is a 71 y.o. female who has above history reviewed by me today presents for follow up visit for metastatic breast cancer Amber Blevins has no new complaints. Feeling well. Denies any new bone pain.   Review of Systems  Constitutional:  Negative for appetite change, chills, fatigue and fever.  HENT:   Negative for hearing loss and voice change.   Eyes:  Negative for eye problems.  Respiratory:  Positive for shortness of breath. Negative for chest tightness and cough.   Cardiovascular:  Negative for chest pain.  Gastrointestinal:  Negative for abdominal distention, abdominal pain and blood in stool.  Endocrine: Negative for hot flashes.  Genitourinary:  Negative for difficulty urinating and frequency.   Musculoskeletal:  Negative for arthralgias.  Skin:  Negative for itching and rash.  Neurological:  Positive for numbness. Negative for extremity weakness.  Hematological:  Negative for adenopathy.  Psychiatric/Behavioral:  Negative for confusion.      MEDICAL HISTORY:  Past Medical History:  Diagnosis Date   A-fib ([place])    Breast cancer (HCC)    COPD (chronic obstructive pulmonary disease) (HCC)    Family history of adverse reaction to anesthesia    brother has problem with coming out of anesthesia   High cholesterol    Hypertension    Personal history of radiation therapy    Pre-diabetes     SURGICAL HISTORY: Past Surgical History:  Procedure Laterality Date   BREAST LUMPECTOMY Left    2010   COLONOSCOPY     EYE SURGERY Left    Cataract surgery   PORTACATH PLACEMENT N/A 08/01/2021   Procedure: INSERTION PORT-A-CATH;  Surgeon: Herbert Pun, MD;  Location: [facility] ORS;  Service: General;  Laterality: N/A;   thorocentesis Right 07/10/2021   and another on 07/25/21    SOCIAL HISTORY: Social History    Socioeconomic History   Marital status: Married    Spouse name: Not on file   Number of children: Not on file   Years of education: Not on file   Highest education level: Not on file  Occupational History   Not on file  Tobacco Use   Smoking status: Former    Packs/day: 1.00    Years: 25.00    Total pack years: 25.00    Types: Cigarettes    Quit date: 84    Years since quitting: 33.8   Smokeless tobacco: Never  Vaping Use   Vaping Use: Never used  Substance and Sexual Activity   Alcohol use: Not Currently   Drug use: Never   Sexual activity: Not on file  Other Topics Concern   Not on file  Social History Narrative   Not on file   Social Determinants of Health   Financial Resource Strain: Not on file  Food Insecurity: Not on file  Transportation Needs: Not on file  Physical Activity: Not on file  Stress: Not on file  Social Connections: Not on file  Intimate Partner Violence: Not  on file    FAMILY HISTORY: Family History  Problem Relation Age of Onset   Cancer Mother        gynecological   Lung cancer Mother    Diabetes Father    Heart disease Father    Parkinson's disease Father    Brain cancer Brother    Bladder Cancer Brother    Pulmonary disease Brother    Rheumatic fever Brother    Breast cancer Neg Hx     ALLERGIES:  is allergic to green tea (camellia sinensis), melaleuca viridiflora, tea tree oil, and lisinopril.  MEDICATIONS:  Current Outpatient Medications  Medication Sig Dispense Refill   abemaciclib (VERZENIO) 100 MG tablet Take 1 tablet (100 mg total) by mouth 2 (two) times daily. Swallow tablets whole. Do not chew, crush, or split tablets before swallowing. 56 tablet 3   acetaminophen (TYLENOL) 650 MG CR tablet Take 650 mg by mouth every 8 (eight) hours as needed for pain.     atorvastatin (LIPITOR) 40 MG tablet Take 40 mg by mouth at bedtime.     Boswellia-Glucosamine-Vit D (OSTEO BI-FLEX-GLUCOS/5-LOXIN) TABS Take 1 capsule by mouth in  the morning and at bedtime.     Calcium Carb-Cholecalciferol 600-400 MG-UNIT TABS Take 1 tablet by mouth daily.     cyanocobalamin 1000 MCG tablet Take 1,000 mcg by mouth daily.     ELIQUIS 5 MG TABS tablet Take 5 mg by mouth 2 (two) times daily.     gabapentin (NEURONTIN) 300 MG capsule Take 1 capsule (300 mg total) by mouth See admin instructions. Take 2 tablets in morning and take 3 tablets in evening. 150 capsule 2   letrozole (FEMARA) 2.5 MG tablet Take 1 tablet (2.5 mg total) by mouth daily. 90 tablet 2   lidocaine-prilocaine (EMLA) cream Apply 1 application. topically as needed. 30 g 5   losartan (COZAAR) 50 MG tablet Take 50 mg by mouth daily.     magnesium chloride (SLOW-MAG) 64 MG TBEC SR tablet Take 1 tablet by mouth at bedtime.     melatonin (MELATONIN MAXIMUM STRENGTH) 5 MG TABS Take 1 tablet by mouth at bedtime.     Turmeric (QC TUMERIC COMPLEX PO) Take 1 capsule by mouth at bedtime.     diphenoxylate-atropine (LOMOTIL) 2.5-0.025 MG tablet Take 1 tablet by mouth 4 (four) times daily as needed for diarrhea or loose stools. (Patient not taking: Reported on 04/28/2022) 30 tablet 0   Docusate Sodium (DSS) 100 MG CAPS Take 1 capsule by mouth every other day. (Patient not taking: Reported on 06/09/2022)     Loperamide HCl (IMODIUM PO) Take by mouth. (Patient not taking: Reported on 04/28/2022)     LORazepam (ATIVAN) 0.5 MG tablet TAKE 1 TABLET BY MOUTH AT BEDTIME AS NEEDED FOR ANXIETY (Patient not taking: Reported on 06/09/2022) 30 tablet 0   ondansetron (ZOFRAN) 8 MG tablet Take by mouth every 8 (eight) hours as needed for nausea or vomiting. (Patient not taking: Reported on 06/09/2022)     potassium chloride SA (KLOR-CON M) 20 MEQ tablet Take 1 tablet (20 mEq total) by mouth daily. (Patient not taking: Reported on 04/28/2022) 30 tablet 0   prochlorperazine (COMPAZINE) 5 MG tablet Take 5 mg by mouth every 6 (six) hours as needed for nausea or vomiting. (Patient not taking: Reported on 06/09/2022)      No current facility-administered medications for this visit.     PHYSICAL EXAMINATION: ECOG PERFORMANCE STATUS: 1 - Symptomatic but completely ambulatory Vitals:   07/13/22  1313  BP: (!) 141/78  Pulse: 90  Resp: 18  Temp: (!) 96.8 F (36 C)  SpO2: 100%   Filed Weights   07/13/22 1313  Weight: 177 lb 1.6 oz (80.3 kg)    Physical Exam Constitutional:      General: Amber Blevins is not in acute distress.    Appearance: Amber Blevins is not diaphoretic.  HENT:     Head: Normocephalic and atraumatic.     Nose: Nose normal.     Mouth/Throat:     Pharynx: No oropharyngeal exudate.  Eyes:     General: No scleral icterus.    Pupils: Pupils are equal, round, and reactive to light.  Cardiovascular:     Rate and Rhythm: Normal rate and regular rhythm.     Heart sounds: No murmur heard. Pulmonary:     Effort: Pulmonary effort is normal. No respiratory distress.     Comments: Decreased breath sound bilaterally. Abdominal:     General: There is no distension.     Palpations: Abdomen is soft.     Tenderness: There is no abdominal tenderness.  Musculoskeletal:        General: Normal range of motion.     Cervical back: Normal range of motion and neck supple.  Skin:    General: Skin is warm and dry.     Findings: No erythema.  Neurological:     Mental Status: Amber Blevins is alert and oriented to person, place, and time.     Cranial Nerves: No cranial nerve deficit.     Motor: No abnormal muscle tone.     Coordination: Coordination normal.  Psychiatric:        Mood and Affect: Affect normal.    Bilateral breast examination was performed.  No palpable discrete mass in either breast.   LABORATORY DATA:  I have reviewed the data as listed     Latest Ref Rng & Units 07/13/2022   12:50 PM 06/09/2022    8:50 AM 04/28/2022   10:26 AM  CBC  WBC 4.0 - 10.5 K/uL 4.1  4.1  3.7   Hemoglobin 12.0 - 15.0 g/dL 12.5  11.2  12.4   Hematocrit 36.0 - 46.0 % 37.2  34.3  36.9   Platelets 150 - 400 K/uL 187  198   205       Latest Ref Rng & Units 07/13/2022   12:50 PM 06/09/2022    8:50 AM 04/28/2022   10:26 AM  CMP  Glucose 70 - 99 mg/dL 82  105  102   BUN 8 - 23 mg/dL 16  18  15    Creatinine 0.44 - 1.00 mg/dL 1.00  1.10  0.85   Sodium 135 - 145 mmol/L 137  140  138   Potassium 3.5 - 5.1 mmol/L 4.4  4.0  4.0   Chloride 98 - 111 mmol/L 104  111  104   CO2 22 - 32 mmol/L 25  26  27    Calcium 8.9 - 10.3 mg/dL 9.4  8.8  9.3   Total Protein 6.5 - 8.1 g/dL 7.1  6.4  6.7   Total Bilirubin 0.3 - 1.2 mg/dL 1.1  1.2  1.2   Alkaline Phos 38 - 126 U/L 49  46  45   AST 15 - 41 U/L 26  25  24    ALT 0 - 44 U/L 14  15  19       RADIOGRAPHIC STUDIES: I have personally reviewed the radiological images as listed and agreed with  the findings in the report. NM Bone Scan Whole Body  Result Date: 06/06/2022 CLINICAL DATA:  breast cancer, AI use. Known osseous metastatic disease. EXAM: NUCLEAR MEDICINE WHOLE BODY BONE SCAN TECHNIQUE: Whole body anterior and posterior images were obtained approximately 3 hours after intravenous injection of radiopharmaceutical. RADIOPHARMACEUTICALS:  20.01 mCi Technetium-89mMDP IV COMPARISON:  CT dated Feb 24, 2022. FINDINGS: There is focal increased radiotracer uptake throughout the axial and appendicular skeleton. There is increased radiotracer uptake in the sternum, manubrium, and RIGHT medial clavicle consistent with known osseous metastatic disease. There is increased radiotracer uptake throughout multiple levels of the thoracic and lumbar spine. Increased radiotracer uptake is seen most predominately in the inferior cervical spine, approximate T3, T5, T7 and T10 vertebral bodies. There is confluent radiotracer uptake throughout the lower lumbar spine and throughout the sacrum consistent with known confluent osseous metastatic disease. This extends across bilateral sacroiliac joints, RIGHT greater than LEFT. There is focal increased radiotracer uptake of the RIGHT ischium and RIGHT  acetabulum. There is focally increased radiotracer uptake in the RIGHT femoral head and in the mid femoral shaft. There is faint increased radiotracer uptake within the LEFT mid femoral shaft and femoral head. There is increased radiotracer uptake throughout several bilateral ribs, including the LEFT posterior approximate eighth rib, RIGHT anterolateral approximate tenth rib, LEFT anterolateral approximate ninth rib. There is radiotracer uptake within the bilateral shoulders, knees and feet. This is favored to be degenerative in etiology. Expected physiologic distribution of radiotracer. IMPRESSION: Constellation of findings are consistent with multifocal osseous metastatic disease throughout the axial and appendicular skeleton. Of note, osseous metastatic disease involves the RIGHT greater than LEFT femur. Consider evaluation with dedicated hip and femur radiographs to evaluate for overall disease burden and potential risk for pathologic fracture. Electronically Signed   By: SValentino SaxonM.D.   On: 06/06/2022 11:26   CT CHEST ABDOMEN PELVIS W CONTRAST  Result Date: 05/31/2022 CLINICAL DATA:  Restaging metastatic breast cancer. EXAM: CT CHEST, ABDOMEN, AND PELVIS WITH CONTRAST TECHNIQUE: Multidetector CT imaging of the chest, abdomen and pelvis was performed following the standard protocol during bolus administration of intravenous contrast. RADIATION DOSE REDUCTION: This exam was performed according to the departmental dose-optimization program which includes automated exposure control, adjustment of the mA and/or kV according to patient size and/or use of iterative reconstruction technique. body oncCONTRAST:  1094mOMNIPAQUE IOHEXOL 300 MG/ML  SOLN COMPARISON:  02/24/2022 FINDINGS: CT CHEST FINDINGS Cardiovascular: Heart size is normal. There is a small pericardial effusion which appears unchanged from previous exam. Aortic atherosclerosis and coronary artery calcifications. Mediastinum/Nodes: No enlarged  mediastinal, hilar, or axillary lymph nodes. Thyroid gland, trachea, and esophagus demonstrate no significant findings. Lungs/Pleura: Stable small bilateral pleural effusions, right greater than left. Unchanged appearance of bilateral interlobular septal thickening and paramediastinal fibrotic changes. Nodularity along the interlobar fissures is identified bilaterally in appears unchanged compared with the previous exam. No suspicious pulmonary parenchymal nodule or mass identified on today's exam. Musculoskeletal: Multifocal sclerotic bone metastases are again noted involving the sternum, lower cervical spine, and thoracic spine. There also scattered sclerotic rib lesions noted bilaterally which are also unchanged in the interval. Unchanged mild, chronic endplate deformities are identified involving the T7 and T10 vertebra. CT ABDOMEN PELVIS FINDINGS Hepatobiliary: Low-density lesion within segment 8/4 is unchanged measuring 8 mm, image 46/2. Ill-defined low-density lesion within segment 4 B is unchanged measuring 1.1 cm, image 52/2. Stable 8 mm lesion within segment 5, image 61/2. Gallbladder appears normal.  No bile  duct dilatation. Pancreas: Unremarkable. No pancreatic ductal dilatation or surrounding inflammatory changes. Spleen: Normal in size without focal abnormality. Adrenals/Urinary Tract: Normal right adrenal gland. Nodule in the left adrenal gland is stable measuring 1.8 by 1.1 cm, image 56/2. No nephrolithiasis, hydronephrosis or suspicious mass. Urinary bladder is unremarkable. Stomach/Bowel: Stomach is nondistended. Colonic diverticulosis. No bowel wall thickening, inflammation, or distension identified. Vascular/Lymphatic: Aortic atherosclerosis. No aneurysm. No signs of abdominopelvic adenopathy. Reproductive: Uterus and bilateral adnexa are unremarkable. Other: No ascites or focal fluid collections. Musculoskeletal: Multifocal sclerotic bone metastases are again noted involving the proximal  appendicular and axial skeleton. Lesions appear similar and multiplicity and size compared with the previous exam. No acute findings. IMPRESSION: 1. Stable CT of the chest, abdomen and pelvis. 2. Unchanged appearance of bilateral pleural effusions, pleural nodularity, and interlobular septal thickening compatible with pleural and lymphangitic metastatic disease. 3. Stable appearance of multifocal sclerotic bone metastases. 4. Multifocal low-attenuation liver lesions are unchanged in the interval. 5. Stable appearance of left adrenal gland metastases. 6. Stable appearance of small pericardial effusion. 7. No new or progressive disease identified. 8. Aortic Atherosclerosis (ICD10-I70.0). Electronically Signed   By: Kerby Moors M.D.   On: 05/31/2022 08:54

## 2022-07-14 LAB — CANCER ANTIGEN 27.29: CA 27.29: 32.7 U/mL (ref 0.0–38.6)

## 2022-07-14 LAB — CANCER ANTIGEN 15-3: CA 15-3: 29.5 U/mL — ABNORMAL HIGH (ref 0.0–25.0)

## 2022-07-21 ENCOUNTER — Other Ambulatory Visit (HOSPITAL_COMMUNITY): Payer: Self-pay

## 2022-07-23 ENCOUNTER — Other Ambulatory Visit (HOSPITAL_COMMUNITY): Payer: Self-pay

## 2022-07-24 ENCOUNTER — Encounter: Payer: Self-pay | Admitting: Oncology

## 2022-07-27 ENCOUNTER — Other Ambulatory Visit (HOSPITAL_COMMUNITY): Payer: Self-pay

## 2022-07-27 ENCOUNTER — Other Ambulatory Visit: Payer: Self-pay

## 2022-07-27 ENCOUNTER — Other Ambulatory Visit: Payer: Self-pay | Admitting: Oncology

## 2022-07-27 DIAGNOSIS — C50919 Malignant neoplasm of unspecified site of unspecified female breast: Secondary | ICD-10-CM

## 2022-07-27 MED ORDER — ABEMACICLIB 100 MG PO TABS
ORAL_TABLET | ORAL | 3 refills | Status: DC
  Filled 2022-07-27: qty 56, fill #0
  Filled 2022-08-27: qty 56, 28d supply, fill #0
  Filled 2022-09-17: qty 56, 28d supply, fill #1

## 2022-07-29 ENCOUNTER — Encounter: Payer: Self-pay | Admitting: Oncology

## 2022-08-03 ENCOUNTER — Encounter: Payer: Self-pay | Admitting: Oncology

## 2022-08-03 MED ORDER — GABAPENTIN 300 MG PO CAPS: 300.0000 mg | ORAL_CAPSULE | ORAL | 2 refills | Status: DC

## 2022-08-03 NOTE — Addendum Note (Signed)
Addended by: Vanice Sarah on: 08/03/2022 10:18 AM   Modules accepted: Orders

## 2022-08-03 NOTE — Telephone Encounter (Signed)
Last visit 10/16--Neurological:  Positive for numbness. Negative for extremity weakness.   Next visit 11/19

## 2022-08-03 NOTE — Telephone Encounter (Signed)
New refill request sent MD.

## 2022-08-10 ENCOUNTER — Inpatient Hospital Stay: Payer: Medicare PPO

## 2022-08-10 ENCOUNTER — Encounter: Payer: Self-pay | Admitting: Oncology

## 2022-08-10 ENCOUNTER — Inpatient Hospital Stay: Payer: Medicare PPO | Attending: Oncology | Admitting: Oncology

## 2022-08-10 ENCOUNTER — Other Ambulatory Visit: Payer: Medicare PPO

## 2022-08-10 VITALS — BP 140/74 | HR 96 | Temp 98.5°F | Resp 18 | Wt 177.7 lb

## 2022-08-10 DIAGNOSIS — C50919 Malignant neoplasm of unspecified site of unspecified female breast: Secondary | ICD-10-CM | POA: Diagnosis present

## 2022-08-10 DIAGNOSIS — G620 Drug-induced polyneuropathy: Secondary | ICD-10-CM | POA: Diagnosis not present

## 2022-08-10 DIAGNOSIS — Z170 Estrogen receptor positive status [ER+]: Secondary | ICD-10-CM | POA: Diagnosis not present

## 2022-08-10 DIAGNOSIS — C7951 Secondary malignant neoplasm of bone: Secondary | ICD-10-CM | POA: Diagnosis not present

## 2022-08-10 DIAGNOSIS — Z79811 Long term (current) use of aromatase inhibitors: Secondary | ICD-10-CM | POA: Diagnosis not present

## 2022-08-10 DIAGNOSIS — C787 Secondary malignant neoplasm of liver and intrahepatic bile duct: Secondary | ICD-10-CM | POA: Insufficient documentation

## 2022-08-10 DIAGNOSIS — Z5111 Encounter for antineoplastic chemotherapy: Secondary | ICD-10-CM

## 2022-08-10 DIAGNOSIS — Z923 Personal history of irradiation: Secondary | ICD-10-CM | POA: Insufficient documentation

## 2022-08-10 DIAGNOSIS — C7971 Secondary malignant neoplasm of right adrenal gland: Secondary | ICD-10-CM | POA: Insufficient documentation

## 2022-08-10 DIAGNOSIS — T451X5A Adverse effect of antineoplastic and immunosuppressive drugs, initial encounter: Secondary | ICD-10-CM | POA: Diagnosis not present

## 2022-08-10 DIAGNOSIS — C7972 Secondary malignant neoplasm of left adrenal gland: Secondary | ICD-10-CM | POA: Diagnosis not present

## 2022-08-10 DIAGNOSIS — Z95828 Presence of other vascular implants and grafts: Secondary | ICD-10-CM

## 2022-08-10 LAB — CBC WITH DIFFERENTIAL/PLATELET
Abs Immature Granulocytes: 0.04 10*3/uL (ref 0.00–0.07)
Basophils Absolute: 0 10*3/uL (ref 0.0–0.1)
Basophils Relative: 1 %
Eosinophils Absolute: 0.1 10*3/uL (ref 0.0–0.5)
Eosinophils Relative: 2 %
HCT: 36.2 % (ref 36.0–46.0)
Hemoglobin: 11.8 g/dL — ABNORMAL LOW (ref 12.0–15.0)
Immature Granulocytes: 1 %
Lymphocytes Relative: 17 %
Lymphs Abs: 0.7 10*3/uL (ref 0.7–4.0)
MCH: 31.4 pg (ref 26.0–34.0)
MCHC: 32.6 g/dL (ref 30.0–36.0)
MCV: 96.3 fL (ref 80.0–100.0)
Monocytes Absolute: 0.5 10*3/uL (ref 0.1–1.0)
Monocytes Relative: 11 %
Neutro Abs: 3 10*3/uL (ref 1.7–7.7)
Neutrophils Relative %: 68 %
Platelets: 199 10*3/uL (ref 150–400)
RBC: 3.76 MIL/uL — ABNORMAL LOW (ref 3.87–5.11)
RDW: 13.8 % (ref 11.5–15.5)
WBC: 4.3 10*3/uL (ref 4.0–10.5)
nRBC: 0 % (ref 0.0–0.2)

## 2022-08-10 LAB — COMPREHENSIVE METABOLIC PANEL
ALT: 17 U/L (ref 0–44)
AST: 22 U/L (ref 15–41)
Albumin: 3.9 g/dL (ref 3.5–5.0)
Alkaline Phosphatase: 46 U/L (ref 38–126)
Anion gap: 8 (ref 5–15)
BUN: 17 mg/dL (ref 8–23)
CO2: 25 mmol/L (ref 22–32)
Calcium: 8.9 mg/dL (ref 8.9–10.3)
Chloride: 104 mmol/L (ref 98–111)
Creatinine, Ser: 0.94 mg/dL (ref 0.44–1.00)
GFR, Estimated: >60 mL/min (ref >60)
Glucose, Bld: 85 mg/dL (ref 70–99)
Potassium: 3.9 mmol/L (ref 3.5–5.1)
Sodium: 137 mmol/L (ref 135–145)
Total Bilirubin: 1.1 mg/dL (ref 0.3–1.2)
Total Protein: 6.7 g/dL (ref 6.5–8.1)

## 2022-08-10 MED ORDER — HEPARIN SOD (PORK) LOCK FLUSH 100 UNIT/ML IV SOLN
500.0000 [IU] | Freq: Once | INTRAVENOUS | Status: AC
  Administered 2022-08-10: 500 [IU] via INTRAVENOUS
  Filled 2022-08-10: qty 5

## 2022-08-10 MED ORDER — SODIUM CHLORIDE 0.9% FLUSH
10.0000 mL | Freq: Once | INTRAVENOUS | Status: AC
  Administered 2022-08-10: 10 mL via INTRAVENOUS
  Filled 2022-08-10: qty 10

## 2022-08-10 NOTE — Assessment & Plan Note (Signed)
Chemotherapy plan as listed above 

## 2022-08-10 NOTE — Assessment & Plan Note (Addendum)
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 Continue Abemaciclib 100 mg twice daily plus letrozole 2.62m daily. CT scan in Dec 2024 Bone scan showed stable metastatic bone disease.

## 2022-08-10 NOTE — Assessment & Plan Note (Signed)
Intermittent A-fib, now on Eliquis 5 mg twice daily. Abemaciclib is associated with <5% risk of A-fib. Patient has increased risk of bleeding.  Close monitor.   Continue follow-up with cardiology. 

## 2022-08-10 NOTE — Assessment & Plan Note (Signed)
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 Zometa Q3 months.

## 2022-08-10 NOTE — Assessment & Plan Note (Signed)
#  Grade 2 neuropathy, continue gabapentin,  Continue gabapentin 600 mg in a.m., 900 mg at bedtime.

## 2022-08-10 NOTE — Progress Notes (Signed)
Hematology/Oncology Progress note [phone] [phone]      Patient Care Team: Gayland Curry, MD as PCP - General (Family Medicine) Noreene Filbert, MD as Consulting Physician (Radiation Oncology)    ASSESSMENT & PLAN:   Cancer Staging  Primary malignant neoplasm of breast with metastasis [facility]) Staging form: Breast, AJCC 8th Edition - Clinical stage from 07/12/2021: Stage IV (rcTX, cNX, pM1, ER+, PR+, HER2-) - Signed by [name], MD on 03/08/2022   Primary malignant neoplasm of breast with metastasis ([place]) Metastatic Breast cancer with extensive thoracic and bone involvement, in visceral crisis, ER+, PR+ HER2 neg Labs reviewed and discussed with patient. Continue Abemaciclib 100 mg twice daily plus letrozole 2.66m daily. CT scan in Dec 2024 Bone scan showed stable metastatic bone disease.    Encounter for antineoplastic chemotherapy Chemotherapy plan as listed above   Chemotherapy-induced neuropathy (HCC) #Grade 2 neuropathy, continue gabapentin,  Continue gabapentin 600 mg in a.m., 900 mg at bedtime.   Metastasis to bone ([employer] 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 Zometa Q3 months.   A-fib (HCC) Intermittent A-fib, now on Eliquis 5 mg twice daily. Abemaciclib is associated with <5% risk of A-fib. Patient has increased risk of bleeding.  Close monitor.   Continue follow-up with cardiology.  No orders of the defined types were placed in this encounter.  Follow up 4 weeks All questions were answered. The patient knows to call the clinic with any problems, questions or concerns.  ZEarlie Server MD, PhD [facility] Hematology Oncology 08/10/2022         CHIEF COMPLAINTS/REASON FOR VISIT:  metastatic breast caner  HISTORY OF PRESENTING ILLNESS:   Amber TOUTANTis a  71y.o.  female with PMH listed below presents for follow up for treatment  of metastatic breast cancer.   Oncology History  [place] of [place]  07/12/2021 Initial Diagnosis   Metastasis to bone of unknown primary ([place]   07/21/2021 - 09/16/2021 Chemotherapy   Patient is on Treatment Plan : BREAST Paclitaxel D1,8,15 q28d     Primary malignant neoplasm of breast with metastasis ([place]  06/25/2021 Imaging   MRI lumbar spine IMPRESSION:  1. 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 Additional metastatic involvement T12, L1 through L3.  No primary tumor site identified. Top differential considerations are Metastatic Disease Unknown primary, less likely Lymphoma or Multiple Myeloma.   2. Superimposed lumbar spine degeneration, including degenerative moderate to severe left L3 and L4 nerve level impingement from disc herniation.      07/02/2021 Imaging   CT chest abdomen pelvis showed innumerable small pulmonary and pleural nodules consistent with diffuse metastasis.  Associated with probable malignant pleural effusion.  Mediastinal and hilar lymphadenopathy consistent with metastatic disease.  Hepatic and bilateral adrenal gland metastasis.  Diffuse extensive destructive metastatic bone disease involving pelvis.   07/12/2021 Cancer Staging   Staging form: Breast, AJCC 8th Edition - Clinical stage from 07/12/2021: Stage IV (rcTX, cNX, pM1, ER+, PR+, HER2-) - Signed by [name] MD on 03/08/2022 Stage prefix: Recurrence   07/17/2021 Initial Diagnosis   Metastatic breast cancer -history of breast cancer, diagnosed in 2010 Left-sided T2 (2.4cm) N0, ER/PR positive, HER2 negative IDC of the breast, s/p  lumpectomy by Dr.Meyer at [facility]. Oncotype score of 11 who completed radiation and she finished 10 years of  Femara from 05/2009 and stopped in 2020.  -07/12/2021 patient underwent right thoracentesis.  Cytology was positive for metastatic carcinoma, compatible with breast origin.  ER/PR +, HER2 negative -07/16/2021, patient underwent iliac bone biopsy.  Positive for metastatic carcinoma.   07/21/2021 - 09/16/2021 Chemotherapy   Patient is on Treatment Plan : BREAST Paclitaxel D1,8,15 q28d     09/04/2021 Imaging   CT chest abdomen pelvis showed improvement of the pulmonary nodularity and thickening in the left and the right lung.  Persistent nodular interstitial and pleural thickening remains.  Improvement of mediastinal and hilar adenopathy, hepatic metastatic lesions are similar.  Adrenal gland metastasis is similar.  Interval increase in the sclerotic bone metastasis.    09/30/2021 -  Chemotherapy   started on abemaciclib 100 mg twice daily and letrozole.   02/24/2022 Imaging    CT chest abdomen pelvis with contrast showed unchanged bilateral pleural effusion with associated intralobular septal thickening of the lung bases.  Without discrete nodularity consistent with stable appearance of treated pleural and lymphangitic metastatic disease.  Stable or minimally diminished subcentimeter liver lesions.  Unchanged left adrenal metastasis.  Unchanged widespread sclerotic osseous metastatic disease involving the included axial and appendicular skeleton.  No evidence of new metastatic disease in the chest abdomen or pelvis.  Coronary artery disease.   03/06/2022 Procedure   Ultrasound guided thoracentesis, removal of 864m pleural fluid.    05/29/2022 Imaging   CT chest abdomen pelvis 1. Stable CT of the chest, abdomen and pelvis. 2. Unchanged appearance of bilateral pleural effusions, pleural nodularity, and interlobular septal thickening compatible with pleural and lymphangitic metastatic disease. 3. Stable appearance of multifocal sclerotic bone metastases. 4. Multifocal low-attenuation liver lesions are  unchanged in the interval. 5. Stable appearance of left adrenal gland metastases. 6. Stable appearance of small pericardial effusion.7. No new or progressive disease identified.8. Aortic Atherosclerosis (ICD10-I70.0).   06/06/2022 Imaging   Bone scan Constellation of findings are consistent with multifocal osseous metastatic disease throughout the axial and appendicular skeleton.Of note, osseous metastatic disease involves the RIGHT greater than LEFT femur. Consider evaluation with dedicated hip and femur radiographs to evaluate for overall disease burden and potential risk for pathologic fracture.    Patient follows up with cardiology for evaluation of bradycardia.  Cardiology notes reviewed.  72-hour Holter monitor revealed revealed predominant sinus rhythm with mean heart rate of 96 bpm, heart rate range 53 to 129 bpm, frequent premature ventricular contractions (27% burden), and intermittent atrial fibrillation (5% burden) with longest episode 3 hours. CHA2DS2-VASc score of 3, per cardiology recommendation patient was started on Eliquis 5 mg twice daily.  She has follow-[place]ology [place] 71y.o[place] female who has above history reviewed by me today presents for follow up visit for metastatic breast cancer She has no new complaints. Feeling well. Denies any new bone pain.   Review of Systems  Constitutional:  Negative for appetite change, chills, fatigue and fever.  HENT:   Negative for hearing loss and voice change.   Eyes:  Negative for eye problems.  Respiratory:  Positive for shortness of breath. Negative for chest tightness and cough.   Cardiovascular:  Negative for chest pain.  Gastrointestinal:  Negative for abdominal distention, abdominal pain and blood in stool.  Endocrine: Negative for hot flashes.  Genitourinary:  Negative for difficulty urinating and  frequency.   Musculoskeletal:  Negative for arthralgias.  Skin:   Negative for itching and rash.  Neurological:  Positive for numbness. Negative for extremity weakness.  Hematological:  Negative for adenopathy.  Psychiatric/Behavioral:  Negative for confusion.      MEDICAL HISTORY:  Past Medical History:  Diagnosis Date   A-fib ([place])    Breast cancer (HCC)    COPD (chronic obstructive pulmonary disease) (HCC)    Family history of adverse reaction to anesthesia    brother has problem with coming out of anesthesia   High cholesterol    Hypertension    Personal history of radiation therapy    Pre-diabetes     SURGICAL HISTORY: Past Surgical History:  Procedure Laterality Date   BREAST LUMPECTOMY Left    2010   COLONOSCOPY     EYE SURGERY Left    Cataract surgery   PORTACATH PLACEMENT N/A 08/01/2021   Procedure: INSERTION PORT-A-CATH;  Surgeon: Herbert Pun, MD;  Location: [facility] ORS;  Service: General;  Laterality: N/A;   thorocentesis Right 07/10/2021   and another on 07/25/21    SOCIAL HISTORY: Social History   Socioeconomic History   Marital status: Married    Spouse name: Not on file   Number of children: Not on file   Years of education: Not on file   Highest education level: Not on file  Occupational History   Not on file  Tobacco Use   Smoking status: Former    Packs/day: 1.00    Years: 25.00    Total pack years: 25.00    Types: Cigarettes    Quit date: 39    Years since quitting: 33.8   Smokeless tobacco: Never  Vaping Use   Vaping Use: Never used  Substance and Sexual Activity   Alcohol use: Not Currently   Drug use: Never   Sexual activity: Not on file  Other Topics Concern   Not on file  Social History Narrative   Not on file   Social Determinants of Health   Financial Resource Strain: Not on file  Food Insecurity: Not on file  Transportation Needs: Not on file  Physical Activity: Not on file  Stress: Not on file  Social Connections: Not on file  Intimate Partner Violence: Not on file     FAMILY HISTORY: Family History  Problem Relation Age of Onset   Cancer Mother        gynecological   Lung cancer Mother    Diabetes Father    Heart disease Father    Parkinson's disease Father    Brain cancer Brother    Bladder Cancer Brother    Pulmonary disease Brother    Rheumatic fever Brother    Breast cancer Neg Hx     ALLERGIES:  is allergic to green tea (camellia sinensis), melaleuca viridiflora, tea tree oil, and lisinopril.  MEDICATIONS:  Current Outpatient Medications  Medication Sig Dispense Refill   abemaciclib (VERZENIO) 100 MG tablet Take 1 tablet (100 mg total) by mouth 2 (two) times daily. Swallow tablets whole. Do not chew, crush, or split tablets before swallowing. 56 tablet 3   acetaminophen (TYLENOL) 650 MG CR tablet Take 650 mg by mouth every 8 (eight) hours as needed for pain.     atorvastatin (LIPITOR) 40 MG tablet Take 40 mg by mouth at bedtime.     Boswellia-Glucosamine-Vit D (OSTEO BI-FLEX-GLUCOS/5-LOXIN) TABS Take 1 capsule by mouth in the morning and at bedtime.     Calcium Carb-Cholecalciferol 600-400 MG-UNIT  TABS Take 1 tablet by mouth daily.     cyanocobalamin 1000 MCG tablet Take 1,000 mcg by mouth daily.     diphenoxylate-atropine (LOMOTIL) 2.5-0.025 MG tablet Take 1 tablet by mouth 4 (four) times daily as needed for diarrhea or loose stools. 30 tablet 0   Docusate Sodium (DSS) 100 MG CAPS Take 1 capsule by mouth every other day.     ELIQUIS 5 MG TABS tablet Take 5 mg by mouth 2 (two) times daily.     gabapentin (NEURONTIN) 300 MG capsule Take 1 capsule (300 mg total) by mouth See admin instructions. Take 2 tablets in morning and take 3 tablets in evening. 150 capsule 2   letrozole (FEMARA) 2.5 MG tablet Take 1 tablet (2.5 mg total) by mouth daily. 90 tablet 2   lidocaine-prilocaine (EMLA) cream Apply 1 application. topically as needed. 30 g 5   Loperamide HCl (IMODIUM PO) Take by mouth.     losartan (COZAAR) 50 MG tablet Take 50 mg by mouth  daily.     magnesium chloride (SLOW-MAG) 64 MG TBEC SR tablet Take 1 tablet by mouth at bedtime.     melatonin (MELATONIN MAXIMUM STRENGTH) 5 MG TABS Take 1 tablet by mouth at bedtime.     Turmeric (QC TUMERIC COMPLEX PO) Take 1 capsule by mouth at bedtime.     LORazepam (ATIVAN) 0.5 MG tablet TAKE 1 TABLET BY MOUTH AT BEDTIME AS NEEDED FOR ANXIETY (Patient not taking: Reported on 06/09/2022) 30 tablet 0   ondansetron (ZOFRAN) 8 MG tablet Take by mouth every 8 (eight) hours as needed for nausea or vomiting. (Patient not taking: Reported on 06/09/2022)     potassium chloride SA (KLOR-CON M) 20 MEQ tablet Take 1 tablet (20 mEq total) by mouth daily. (Patient not taking: Reported on 08/10/2022) 30 tablet 0   prochlorperazine (COMPAZINE) 5 MG tablet Take 5 mg by mouth every 6 (six) hours as needed for nausea or vomiting. (Patient not taking: Reported on 08/10/2022)     No current facility-administered medications for this visit.     PHYSICAL EXAMINATION: ECOG PERFORMANCE STATUS: 1 - Symptomatic but completely ambulatory Vitals:   08/10/22 1307  BP: (!) 140/74  Pulse: 96  Resp: 18  Temp: 98.5 F (36.9 C)  SpO2: 100%   Filed Weights   08/10/22 1307  Weight: 177 lb 11.2 oz (80.6 kg)    Physical Exam Constitutional:      General: She is not in acute distress.    Appearance: She is not diaphoretic.  HENT:     Head: Normocephalic and atraumatic.     Nose: Nose normal.     Mouth/Throat:     Pharynx: No oropharyngeal exudate.  Eyes:     General: No scleral icterus.    Pupils: Pupils are equal, round, and reactive to light.  Cardiovascular:     Rate and Rhythm: Normal rate and regular rhythm.     Heart sounds: No murmur heard. Pulmonary:     Effort: Pulmonary effort is normal. No respiratory distress.     Comments: Decreased breath sound bilaterally. Abdominal:     General: There is no distension.     Palpations: Abdomen is soft.     Tenderness: There is no abdominal tenderness.   Musculoskeletal:        General: Normal range of motion.     Cervical back: Normal range of motion and neck supple.  Skin:    General: Skin is warm and dry.  Findings: No erythema.  Neurological:     Mental Status: She is alert and oriented to person, place, and time.     Cranial Nerves: No cranial nerve deficit.     Motor: No abnormal muscle tone.     Coordination: Coordination normal.  Psychiatric:        Mood and Affect: Affect normal.    Bilateral breast examination was performed.  No palpable discrete mass in either breast.   LABORATORY DATA:  I have reviewed the data as listed     Latest Ref Rng & Units 08/10/2022   12:53 PM 07/13/2022   12:50 PM 06/09/2022    8:50 AM  CBC  WBC 4.0 - 10.5 K/uL 4.3  4.1  4.1   Hemoglobin 12.0 - 15.0 g/dL 11.8  12.5  11.2   Hematocrit 36.0 - 46.0 % 36.2  37.2  34.3   Platelets 150 - 400 K/uL 199  187  198       Latest Ref Rng & Units 08/10/2022   12:53 PM 07/13/2022   12:50 PM 06/09/2022    8:50 AM  CMP  Glucose 70 - 99 mg/dL 85  82  105   BUN 8 - 23 mg/dL _0 Creatinine 0.44 - 1.00 mg/dL 0.94  1.00  1.10   Sodium 135 - 145 mmol/L 137  137  140   Potassium 3.5 - 5.1 mmol/L 3.9  4.4  4.0   Chloride 98 - 111 mmol/L 104  104  111   CO2 22 - 32 mmol/L _1 Calcium 8.9 - 10.3 mg/dL 8.9  9.4  8.8   Total Protein 6.5 - 8.1 g/dL 6.7  7.1  6.4   Total Bilirubin 0.3 - 1.2 mg/dL 1.1  1.1  1.2   Alkaline Phos 38 - 126 U/L 46  49  46   AST 15 - 41 U/L _2 ALT 0 - 44 U/L _3 RADIOGRAPHIC STUDIES: I have personally reviewed the radiological images as listed and agreed with the findings in the report. NM Bone Scan Whole Body  Result Date: 06/06/2022 CLINICAL DATA:  breast cancer, AI use. Known osseous metastatic disease. EXAM: NUCLEAR MEDICINE WHOLE BODY BONE SCAN TECHNIQUE: Whole body anterior and posterior images were obtained approximately 3 hours after intravenous injection of  radiopharmaceutical. RADIOPHARMACEUTICALS:  20.01 mCi Technetium-50mMDP IV COMPARISON:  CT dated Feb 24, 2022. FINDINGS: There is focal increased radiotracer uptake throughout the axial and appendicular skeleton. There is increased radiotracer uptake in the sternum, manubrium, and RIGHT medial clavicle consistent with known osseous metastatic disease. There is increased radiotracer uptake throughout multiple levels of the thoracic and lumbar spine. Increased radiotracer uptake is seen most predominately in the inferior cervical spine, approximate T3, T5, T7 and T10 vertebral bodies. There is confluent radiotracer uptake throughout the lower lumbar spine and throughout the sacrum consistent with known confluent osseous metastatic disease. This extends across bilateral sacroiliac joints, RIGHT greater than LEFT. There is focal increased radiotracer uptake of the RIGHT ischium and RIGHT acetabulum. There is focally increased radiotracer uptake in the RIGHT femoral head and in the mid femoral shaft. There is faint increased radiotracer uptake within the LEFT mid femoral shaft and femoral head. There is increased radiotracer uptake throughout several bilateral ribs, including the LEFT posterior approximate eighth rib, RIGHT anterolateral approximate tenth rib, LEFT anterolateral approximate ninth  rib. There is radiotracer uptake within the bilateral shoulders, knees and feet. This is favored to be degenerative in etiology. Expected physiologic distribution of radiotracer. IMPRESSION: Constellation of findings are consistent with multifocal osseous metastatic disease throughout the axial and appendicular skeleton. Of note, osseous metastatic disease involves the RIGHT greater than LEFT femur. Consider evaluation with dedicated hip and femur radiographs to evaluate for overall disease burden and potential risk for pathologic fracture. Electronically Signed   By: Valentino Saxon M.D.   On: 06/06/2022 11:26   CT CHEST  ABDOMEN PELVIS W CONTRAST  Result Date: 05/31/2022 CLINICAL DATA:  Restaging metastatic breast cancer. EXAM: CT CHEST, ABDOMEN, AND PELVIS WITH CONTRAST TECHNIQUE: Multidetector CT imaging of the chest, abdomen and pelvis was performed following the standard protocol during bolus administration of intravenous contrast. RADIATION DOSE REDUCTION: This exam was performed according to the departmental dose-optimization program which includes automated exposure control, adjustment of the mA and/or kV according to patient size and/or use of iterative reconstruction technique. body oncCONTRAST:  141m OMNIPAQUE IOHEXOL 300 MG/ML  SOLN COMPARISON:  02/24/2022 FINDINGS: CT CHEST FINDINGS Cardiovascular: Heart size is normal. There is a small pericardial effusion which appears unchanged from previous exam. Aortic atherosclerosis and coronary artery calcifications. Mediastinum/Nodes: No enlarged mediastinal, hilar, or axillary lymph nodes. Thyroid gland, trachea, and esophagus demonstrate no significant findings. Lungs/Pleura: Stable small bilateral pleural effusions, right greater than left. Unchanged appearance of bilateral interlobular septal thickening and paramediastinal fibrotic changes. Nodularity along the interlobar fissures is identified bilaterally in appears unchanged compared with the previous exam. No suspicious pulmonary parenchymal nodule or mass identified on today's exam. Musculoskeletal: Multifocal sclerotic bone metastases are again noted involving the sternum, lower cervical spine, and thoracic spine. There also scattered sclerotic rib lesions noted bilaterally which are also unchanged in the interval. Unchanged mild, chronic endplate deformities are identified involving the T7 and T10 vertebra. CT ABDOMEN PELVIS FINDINGS Hepatobiliary: Low-density lesion within segment 8/4 is unchanged measuring 8 mm, image 46/2. Ill-defined low-density lesion within segment 4 B is unchanged measuring 1.1 cm, image 52/2.  Stable 8 mm lesion within segment 5, image 61/2. Gallbladder appears normal.  No bile duct dilatation. Pancreas: Unremarkable. No pancreatic ductal dilatation or surrounding inflammatory changes. Spleen: Normal in size without focal abnormality. Adrenals/Urinary Tract: Normal right adrenal gland. Nodule in the left adrenal gland is stable measuring 1.8 by 1.1 cm, image 56/2. No nephrolithiasis, hydronephrosis or suspicious mass. Urinary bladder is unremarkable. Stomach/Bowel: Stomach is nondistended. Colonic diverticulosis. No bowel wall thickening, inflammation, or distension identified. Vascular/Lymphatic: Aortic atherosclerosis. No aneurysm. No signs of abdominopelvic adenopathy. Reproductive: Uterus and bilateral adnexa are unremarkable. Other: No ascites or focal fluid collections. Musculoskeletal: Multifocal sclerotic bone metastases are again noted involving the proximal appendicular and axial skeleton. Lesions appear similar and multiplicity and size compared with the previous exam. No acute findings. IMPRESSION: 1. Stable CT of the chest, abdomen and pelvis. 2. Unchanged appearance of bilateral pleural effusions, pleural nodularity, and interlobular septal thickening compatible with pleural and lymphangitic metastatic disease. 3. Stable appearance of multifocal sclerotic bone metastases. 4. Multifocal low-attenuation liver lesions are unchanged in the interval. 5. Stable appearance of left adrenal gland metastases. 6. Stable appearance of small pericardial effusion. 7. No new or progressive disease identified. 8. Aortic Atherosclerosis (ICD10-I70.0). Electronically Signed   By: TKerby MoorsM.D.   On: 05/31/2022 08:54

## 2022-08-11 LAB — CANCER ANTIGEN 15-3: CA 15-3: 29.1 U/mL — ABNORMAL HIGH (ref 0.0–25.0)

## 2022-08-11 LAB — CANCER ANTIGEN 27.29: CA 27.29: 35.3 U/mL (ref 0.0–38.6)

## 2022-08-13 ENCOUNTER — Other Ambulatory Visit (HOSPITAL_COMMUNITY): Payer: Self-pay

## 2022-08-17 ENCOUNTER — Other Ambulatory Visit (HOSPITAL_COMMUNITY): Payer: Self-pay

## 2022-08-18 ENCOUNTER — Inpatient Hospital Stay: Payer: Medicare PPO

## 2022-08-19 ENCOUNTER — Other Ambulatory Visit (HOSPITAL_COMMUNITY): Payer: Self-pay

## 2022-08-27 ENCOUNTER — Other Ambulatory Visit (HOSPITAL_COMMUNITY): Payer: Self-pay

## 2022-08-31 ENCOUNTER — Ambulatory Visit
Admission: RE | Admit: 2022-08-31 | Discharge: 2022-08-31 | Disposition: A | Discharge status: Home / Self Care | Payer: Medicare PPO | Source: Ambulatory Visit | Attending: Oncology | Admitting: Oncology

## 2022-08-31 DIAGNOSIS — C50919 Malignant neoplasm of unspecified site of unspecified female breast: Secondary | ICD-10-CM | POA: Diagnosis present

## 2022-08-31 DIAGNOSIS — I2510 Atherosclerotic heart disease of native coronary artery without angina pectoris: Secondary | ICD-10-CM | POA: Insufficient documentation

## 2022-08-31 DIAGNOSIS — J90 Pleural effusion, not elsewhere classified: Secondary | ICD-10-CM | POA: Insufficient documentation

## 2022-08-31 DIAGNOSIS — C7951 Secondary malignant neoplasm of bone: Secondary | ICD-10-CM | POA: Insufficient documentation

## 2022-08-31 DIAGNOSIS — I700 Atherosclerosis of aorta: Secondary | ICD-10-CM | POA: Insufficient documentation

## 2022-08-31 MED ORDER — IOHEXOL 300 MG/ML  SOLN
100.0000 mL | Freq: Once | INTRAMUSCULAR | Status: AC | PRN
  Administered 2022-08-31: 100 mL via INTRAVENOUS

## 2022-09-07 ENCOUNTER — Inpatient Hospital Stay (HOSPITAL_BASED_OUTPATIENT_CLINIC_OR_DEPARTMENT_OTHER): Payer: Medicare PPO | Admitting: Oncology

## 2022-09-07 ENCOUNTER — Inpatient Hospital Stay: Payer: Medicare PPO

## 2022-09-07 ENCOUNTER — Inpatient Hospital Stay: Payer: Medicare PPO | Attending: Oncology

## 2022-09-07 ENCOUNTER — Encounter: Payer: Self-pay | Admitting: Oncology

## 2022-09-07 VITALS — BP 120/72 | HR 60 | Temp 97.9°F | Resp 18 | Wt 179.6 lb

## 2022-09-07 DIAGNOSIS — Z170 Estrogen receptor positive status [ER+]: Secondary | ICD-10-CM | POA: Insufficient documentation

## 2022-09-07 DIAGNOSIS — Z79899 Other long term (current) drug therapy: Secondary | ICD-10-CM | POA: Diagnosis not present

## 2022-09-07 DIAGNOSIS — Z5111 Encounter for antineoplastic chemotherapy: Secondary | ICD-10-CM

## 2022-09-07 DIAGNOSIS — C7931 Secondary malignant neoplasm of brain: Secondary | ICD-10-CM | POA: Insufficient documentation

## 2022-09-07 DIAGNOSIS — G620 Drug-induced polyneuropathy: Secondary | ICD-10-CM

## 2022-09-07 DIAGNOSIS — C7972 Secondary malignant neoplasm of left adrenal gland: Secondary | ICD-10-CM | POA: Insufficient documentation

## 2022-09-07 DIAGNOSIS — C50919 Malignant neoplasm of unspecified site of unspecified female breast: Secondary | ICD-10-CM

## 2022-09-07 DIAGNOSIS — Z7901 Long term (current) use of anticoagulants: Secondary | ICD-10-CM | POA: Diagnosis not present

## 2022-09-07 DIAGNOSIS — I3139 Other pericardial effusion (noninflammatory): Secondary | ICD-10-CM | POA: Diagnosis not present

## 2022-09-07 DIAGNOSIS — C7951 Secondary malignant neoplasm of bone: Secondary | ICD-10-CM | POA: Diagnosis not present

## 2022-09-07 DIAGNOSIS — C50912 Malignant neoplasm of unspecified site of left female breast: Secondary | ICD-10-CM | POA: Insufficient documentation

## 2022-09-07 DIAGNOSIS — J910 Malignant pleural effusion: Secondary | ICD-10-CM | POA: Insufficient documentation

## 2022-09-07 DIAGNOSIS — R001 Bradycardia, unspecified: Secondary | ICD-10-CM | POA: Diagnosis not present

## 2022-09-07 DIAGNOSIS — I480 Paroxysmal atrial fibrillation: Secondary | ICD-10-CM | POA: Diagnosis not present

## 2022-09-07 DIAGNOSIS — Z923 Personal history of irradiation: Secondary | ICD-10-CM | POA: Diagnosis not present

## 2022-09-07 DIAGNOSIS — Z79811 Long term (current) use of aromatase inhibitors: Secondary | ICD-10-CM | POA: Insufficient documentation

## 2022-09-07 DIAGNOSIS — I4891 Unspecified atrial fibrillation: Secondary | ICD-10-CM

## 2022-09-07 DIAGNOSIS — C771 Secondary and unspecified malignant neoplasm of intrathoracic lymph nodes: Secondary | ICD-10-CM | POA: Diagnosis not present

## 2022-09-07 DIAGNOSIS — Z7983 Long term (current) use of bisphosphonates: Secondary | ICD-10-CM | POA: Diagnosis not present

## 2022-09-07 DIAGNOSIS — T451X5A Adverse effect of antineoplastic and immunosuppressive drugs, initial encounter: Secondary | ICD-10-CM

## 2022-09-07 LAB — CBC WITH DIFFERENTIAL/PLATELET
Abs Immature Granulocytes: 0.01 10*3/uL (ref 0.00–0.07)
Basophils Absolute: 0 10*3/uL (ref 0.0–0.1)
Basophils Relative: 1 %
Eosinophils Absolute: 0.2 10*3/uL (ref 0.0–0.5)
Eosinophils Relative: 3 %
HCT: 37 % (ref 36.0–46.0)
Hemoglobin: 12.3 g/dL (ref 12.0–15.0)
Immature Granulocytes: 0 %
Lymphocytes Relative: 18 %
Lymphs Abs: 0.8 10*3/uL (ref 0.7–4.0)
MCH: 31.8 pg (ref 26.0–34.0)
MCHC: 33.2 g/dL (ref 30.0–36.0)
MCV: 95.6 fL (ref 80.0–100.0)
Monocytes Absolute: 0.5 10*3/uL (ref 0.1–1.0)
Monocytes Relative: 11 %
Neutro Abs: 3.1 10*3/uL (ref 1.7–7.7)
Neutrophils Relative %: 67 %
Platelets: 204 10*3/uL (ref 150–400)
RBC: 3.87 MIL/uL (ref 3.87–5.11)
RDW: 14 % (ref 11.5–15.5)
WBC: 4.7 10*3/uL (ref 4.0–10.5)
nRBC: 0 % (ref 0.0–0.2)

## 2022-09-07 LAB — COMPREHENSIVE METABOLIC PANEL
ALT: 16 U/L (ref 0–44)
AST: 29 U/L (ref 15–41)
Albumin: 4 g/dL (ref 3.5–5.0)
Alkaline Phosphatase: 44 U/L (ref 38–126)
Anion gap: 9 (ref 5–15)
BUN: 15 mg/dL (ref 8–23)
CO2: 26 mmol/L (ref 22–32)
Calcium: 8.9 mg/dL (ref 8.9–10.3)
Chloride: 103 mmol/L (ref 98–111)
Creatinine, Ser: 1.02 mg/dL — ABNORMAL HIGH (ref 0.44–1.00)
GFR, Estimated: 58 mL/min — ABNORMAL LOW (ref >60)
Glucose, Bld: 89 mg/dL (ref 70–99)
Potassium: 3.9 mmol/L (ref 3.5–5.1)
Sodium: 138 mmol/L (ref 135–145)
Total Bilirubin: 0.9 mg/dL (ref 0.3–1.2)
Total Protein: 7 g/dL (ref 6.5–8.1)

## 2022-09-07 MED ORDER — HEPARIN SOD (PORK) LOCK FLUSH 100 UNIT/ML IV SOLN
500.0000 [IU] | Freq: Once | INTRAVENOUS | Status: AC
  Administered 2022-09-07: 500 [IU] via INTRAVENOUS
  Filled 2022-09-07: qty 5

## 2022-09-07 MED ORDER — SODIUM CHLORIDE 0.9% FLUSH
10.0000 mL | Freq: Once | INTRAVENOUS | Status: AC
  Administered 2022-09-07: 10 mL via INTRAVENOUS
  Filled 2022-09-07: qty 10

## 2022-09-07 MED ORDER — ZOLEDRONIC ACID 4 MG/100ML IV SOLN
4.0000 mg | Freq: Once | INTRAVENOUS | Status: AC
  Administered 2022-09-07: 4 mg via INTRAVENOUS
  Filled 2022-09-07: qty 100

## 2022-09-07 MED ORDER — SODIUM CHLORIDE 0.9 % IV SOLN
INTRAVENOUS | Status: DC
  Filled 2022-09-07: qty 250

## 2022-09-07 NOTE — Progress Notes (Signed)
Hematology/Oncology Progress note [phone] [phone]      Patient Care Team: Aldridge, Barbara, MD as PCP - General (Family Medicine) Chrystal, Glenn, MD as Consulting Physician (Radiation Oncology)   ASSESSMENT & PLAN:   Cancer Staging  Primary malignant neoplasm of breast with metastasis (HCC) Staging form: Breast, AJCC 8th Edition - Clinical stage from 07/12/2021: Stage IV (rcTX, cNX, pM1, ER+, PR+, HER2-) - Signed by Yu, Zhou, MD on 03/08/2022   Primary malignant neoplasm of breast with metastasis (HCC) Metastatic Breast cancer with extensive thoracic and bone involvement, in visceral crisis, ER+, PR+ HER2 neg Labs reviewed and discussed with patient. CT scan  was reviewed and discussed with patient. Stable disease.  Continue Abemaciclib 100 mg twice daily plus letrozole 2.5mg daily.  Encounter for antineoplastic chemotherapy Chemotherapy plan as listed above   Chemotherapy-induced neuropathy (HCC) #Grade 2 neuropathy, continue gabapentin,  Continue gabapentin 600 mg in a.m., 900 mg at bedtime.   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 She is doing well clinically and prefers to follow up every 8 weeks.  I will adjust her Zometa treatments to every 8 weeks.  Continue calcium and vitamin D supplementation. .    A-fib (HCC) Intermittent A-fib, now on Eliquis 5 mg twice daily. Abemaciclib is associated with <5% risk of A-fib. Patient has increased risk of bleeding.  Close monitor.   Continue follow-up with cardiology.  Orders Placed This Encounter  Procedures   Comprehensive metabolic panel    Standing Status:   Future    Standing Expiration Date:   09/07/2023   CBC with Differential/Platelet    Standing Status:   Future    Standing Expiration Date:   09/08/2023   Cancer antigen 15-3    Standing Status:   Future    Standing  Expiration Date:   09/08/2023   Cancer antigen 27.29    Standing Status:   Future    Standing Expiration Date:   09/08/2023   Follow up 4 weeks All questions were answered. The patient knows to call the clinic with any problems, questions or concerns.  Zhou Yu, MD, PhD [facility] 09/07/2022     CHIEF COMPLAINTS/REASON FOR VISIT:  metastatic breast caner  HISTORY OF PRESENTING ILLNESS:   Amber Blevins is a  71 y.o.  female with PMH listed below presents for follow up for treatment of metastatic breast cancer.   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 Additional metastatic involvement T12, L1 through L3.  No primary tumor site identified. Top differential considerations are Metastatic Disease Unknown primary, less likely Lymphoma or Multiple Myeloma.   2. Superimposed lumbar spine degeneration, including degenerative moderate to severe left L3 and L4 nerve level impingement from disc herniation.      07/02/2021 Imaging   CT chest abdomen pelvis showed innumerable small   pulmonary and pleural nodules consistent with diffuse metastasis.  Associated with probable malignant pleural effusion.  Mediastinal and hilar lymphadenopathy consistent with metastatic disease.  Hepatic and bilateral adrenal gland metastasis.  Diffuse extensive destructive metastatic bone  disease involving pelvis.   07/12/2021 Cancer Staging   Staging form: Breast, AJCC 8th Edition - Clinical stage from 07/12/2021: Stage IV (rcTX, cNX, pM1, ER+, PR+, HER2-) - Signed by Yu, Zhou, MD on 03/08/2022 Stage prefix: Recurrence   07/17/2021 Initial Diagnosis   Metastatic breast cancer -history of breast cancer, diagnosed in 2010 Left-sided T2 (2.4cm) N0, ER/PR positive, HER2 negative IDC of the breast, s/p lumpectomy by Dr.Meyer at [facility]. Oncotype score of 11 who completed radiation and she finished 10 years of Femara from 05/2009 and stopped in 2020.  -07/12/2021 patient underwent right thoracentesis.  Cytology was positive for metastatic carcinoma, compatible with breast origin.  ER/PR +, HER2 negative -07/16/2021, patient underwent iliac bone biopsy.  Positive for metastatic carcinoma.   07/21/2021 - 09/16/2021 Chemotherapy   Patient is on Treatment Plan : BREAST Paclitaxel D1,8,15 q28d     09/04/2021 Imaging   CT chest abdomen pelvis showed improvement of the pulmonary nodularity and thickening in the left and the right lung.  Persistent nodular interstitial and pleural thickening remains.  Improvement of mediastinal and hilar adenopathy, hepatic metastatic lesions are similar.  Adrenal gland metastasis is similar.  Interval increase in the sclerotic bone metastasis.    09/30/2021 -  Chemotherapy   started on abemaciclib 100 mg twice daily and letrozole.   02/24/2022 Imaging    CT chest abdomen pelvis with contrast showed unchanged bilateral pleural effusion with associated intralobular septal thickening of the lung bases.  Without discrete nodularity consistent with stable appearance of treated pleural and lymphangitic metastatic disease.  Stable or minimally diminished subcentimeter liver lesions.  Unchanged left adrenal metastasis.  Unchanged widespread sclerotic osseous metastatic disease involving the included axial and appendicular skeleton.  No evidence of new metastatic disease  in the chest abdomen or pelvis.  Coronary artery disease.   03/06/2022 Procedure   Ultrasound guided thoracentesis, removal of 800ml pleural fluid.    05/29/2022 Imaging   CT chest abdomen pelvis 1. Stable CT of the chest, abdomen and pelvis. 2. Unchanged appearance of bilateral pleural effusions, pleural nodularity, and interlobular septal thickening compatible with pleural and lymphangitic metastatic disease. 3. Stable appearance of multifocal sclerotic bone metastases. 4. Multifocal low-attenuation liver lesions are unchanged in the interval. 5. Stable appearance of left adrenal gland metastases. 6. Stable appearance of small pericardial effusion.7. No new or progressive disease identified.8. Aortic Atherosclerosis (ICD10-I70.0).   06/06/2022 Imaging   Bone scan Constellation of findings are consistent with multifocal osseous metastatic disease throughout the axial and appendicular skeleton.Of note, osseous metastatic disease involves the RIGHT greater than LEFT femur. Consider evaluation with dedicated hip and femur radiographs to evaluate for overall disease burden and potential risk for pathologic fracture.   08/31/2022 Imaging   CT chest abdomen pelvis w contrast 1. Unchanged small right, trace left pleural effusions with associated diffuse interlobular septal thickening and fine fissural and perilymphatic nodularity throughout the lungs, findings consistent with lymphangitic metastatic disease. 2. Stable or slightly diminished very subtle hypodense lesion of the central right lobe of the liver, hepatic segment VIII measuring 0.6 cm, previously 0.8 cm. Additional unchanged, very subtle hypodense lesion of hepatic segment IV B measuring 1.1 cm. 3. Unchanged widespread sclerotic osseous metastatic disease throughout the included axial and proximal appendicular skeleton, particularly dense in   the lower lumbar spine and sacrum. 4. Unchanged left adrenal nodule measuring 1.7 x 1.1 cm. 5.  Overall constellation of findings is consistent with stable metastatic disease. 6. Coronary artery disease.     Patient follows up with cardiology for evaluation of bradycardia.  Cardiology notes reviewed.  72-hour Holter monitor revealed revealed predominant sinus rhythm with mean heart rate of 96 bpm, heart rate range 53 to 129 bpm, frequent premature ventricular contractions (27% burden), and intermittent atrial fibrillation (5% burden) with longest episode 3 hours. CHA2DS2-VASc score of 3, per cardiology recommendation patient was started on Eliquis 5 mg twice daily.  She has follow-up appointment with cardiology in 3   INTERVAL HISTORY Amber Blevins is a 71 y.o. female who has above history reviewed by me today presents for follow up visit for metastatic breast cancer She has no new complaints. Feeling well. Denies any new bone pain.   Review of Systems  Constitutional:  Negative for appetite change, chills, fatigue and fever.  HENT:   Negative for hearing loss and voice change.   Eyes:  Negative for eye problems.  Respiratory:  Positive for shortness of breath. Negative for chest tightness and cough.   Cardiovascular:  Negative for chest pain.  Gastrointestinal:  Negative for abdominal distention, abdominal pain and blood in stool.  Endocrine: Negative for hot flashes.  Genitourinary:  Negative for difficulty urinating and frequency.   Musculoskeletal:  Negative for arthralgias.  Skin:  Negative for itching and rash.  Neurological:  Positive for numbness. Negative for extremity weakness.  Hematological:  Negative for adenopathy.  Psychiatric/Behavioral:  Negative for confusion.      MEDICAL HISTORY:  Past Medical History:  Diagnosis Date   A-fib (HCC)    Breast cancer (HCC)    COPD (chronic obstructive pulmonary disease) (HCC)    Family history of adverse reaction to anesthesia    brother has problem with coming out of anesthesia   High cholesterol    Hypertension     Personal history of radiation therapy    Pre-diabetes     SURGICAL HISTORY: Past Surgical History:  Procedure Laterality Date   BREAST LUMPECTOMY Left    2010   COLONOSCOPY     EYE SURGERY Left    Cataract surgery   PORTACATH PLACEMENT N/A 08/01/2021   Procedure: INSERTION PORT-A-CATH;  Surgeon: Cintron-Diaz, Edgardo, MD;  Location: [facility] ORS;  Service: General;  Laterality: N/A;   thorocentesis Right 07/10/2021   and another on 07/25/21    SOCIAL HISTORY: Social History   Socioeconomic History   Marital status: Married    Spouse name: Not on file   Number of children: Not on file   Years of education: Not on file   Highest education level: Not on file  Occupational History   Not on file  Tobacco Use   Smoking status: Former    Packs/day: 1.00    Years: 25.00    Total pack years: 25.00    Types: Cigarettes    Quit date: 1990    Years since quitting: 33.9   Smokeless tobacco: Never  Vaping Use   Vaping Use: Never used  Substance and Sexual Activity   Alcohol use: Not Currently   Drug use: Never   Sexual activity: Not on file  Other Topics Concern   Not on file  Social History Narrative   Not on file   Social Determinants of Health   Financial Resource Strain: Not on file  Food Insecurity: Not on file    Transportation Needs: Not on file  Physical Activity: Not on file  Stress: Not on file  Social Connections: Not on file  Intimate Partner Violence: Not on file    FAMILY HISTORY: Family History  Problem Relation Age of Onset   Cancer Mother        gynecological   Lung cancer Mother    Diabetes Father    Heart disease Father    Parkinson's disease Father    Brain cancer Brother    Bladder Cancer Brother    Pulmonary disease Brother    Rheumatic fever Brother    Breast cancer Neg Hx     ALLERGIES:  is allergic to green tea (camellia sinensis), melaleuca viridiflora, tea tree oil, and lisinopril.  MEDICATIONS:  Current Outpatient Medications   Medication Sig Dispense Refill   abemaciclib (VERZENIO) 100 MG tablet Take 1 tablet (100 mg total) by mouth 2 (two) times daily. Swallow tablets whole. Do not chew, crush, or split tablets before swallowing. 56 tablet 3   acetaminophen (TYLENOL) 650 MG CR tablet Take 650 mg by mouth every 8 (eight) hours as needed for pain.     atorvastatin (LIPITOR) 40 MG tablet Take 40 mg by mouth at bedtime.     Boswellia-Glucosamine-Vit D (OSTEO BI-FLEX-GLUCOS/5-LOXIN) TABS Take 1 capsule by mouth in the morning and at bedtime.     Calcium Carb-Cholecalciferol 600-400 MG-UNIT TABS Take 1 tablet by mouth daily.     cyanocobalamin 1000 MCG tablet Take 1,000 mcg by mouth daily.     diphenoxylate-atropine (LOMOTIL) 2.5-0.025 MG tablet Take 1 tablet by mouth 4 (four) times daily as needed for diarrhea or loose stools. 30 tablet 0   Docusate Sodium (DSS) 100 MG CAPS Take 1 capsule by mouth every other day.     ELIQUIS 5 MG TABS tablet Take 5 mg by mouth 2 (two) times daily.     gabapentin (NEURONTIN) 300 MG capsule Take 1 capsule (300 mg total) by mouth See admin instructions. Take 2 tablets in morning and take 3 tablets in evening. 150 capsule 2   letrozole (FEMARA) 2.5 MG tablet Take 1 tablet (2.5 mg total) by mouth daily. 90 tablet 2   lidocaine-prilocaine (EMLA) cream Apply 1 application. topically as needed. 30 g 5   losartan (COZAAR) 50 MG tablet Take 50 mg by mouth daily.     magnesium chloride (SLOW-MAG) 64 MG TBEC SR tablet Take 1 tablet by mouth at bedtime.     melatonin (MELATONIN MAXIMUM STRENGTH) 5 MG TABS Take 1 tablet by mouth at bedtime.     ondansetron (ZOFRAN) 8 MG tablet Take by mouth every 8 (eight) hours as needed for nausea or vomiting.     prochlorperazine (COMPAZINE) 5 MG tablet Take 5 mg by mouth every 6 (six) hours as needed for nausea or vomiting.     Turmeric (QC TUMERIC COMPLEX PO) Take 1 capsule by mouth at bedtime.     Loperamide HCl (IMODIUM PO) Take by mouth. (Patient not taking:  Reported on 09/07/2022)     LORazepam (ATIVAN) 0.5 MG tablet TAKE 1 TABLET BY MOUTH AT BEDTIME AS NEEDED FOR ANXIETY (Patient not taking: Reported on 06/09/2022) 30 tablet 0   potassium chloride SA (KLOR-CON M) 20 MEQ tablet Take 1 tablet (20 mEq total) by mouth daily. (Patient not taking: Reported on 08/10/2022) 30 tablet 0   No current facility-administered medications for this visit.     PHYSICAL EXAMINATION: ECOG PERFORMANCE STATUS: 1 - Symptomatic but completely ambulatory Vitals:     09/07/22 1318  BP: 120/72  Pulse: 60  Resp: 18  Temp: 97.9 F (36.6 C)   Filed Weights   09/07/22 1318  Weight: 179 lb 9.6 oz (81.5 kg)    Physical Exam Constitutional:      General: She is not in acute distress.    Appearance: She is not diaphoretic.  HENT:     Head: Normocephalic and atraumatic.     Nose: Nose normal.     Mouth/Throat:     Pharynx: No oropharyngeal exudate.  Eyes:     General: No scleral icterus.    Pupils: Pupils are equal, round, and reactive to light.  Cardiovascular:     Rate and Rhythm: Normal rate and regular rhythm.     Heart sounds: No murmur heard. Pulmonary:     Effort: Pulmonary effort is normal. No respiratory distress.     Comments: Decreased breath sound bilaterally. Abdominal:     General: There is no distension.     Palpations: Abdomen is soft.     Tenderness: There is no abdominal tenderness.  Musculoskeletal:        General: Normal range of motion.     Cervical back: Normal range of motion and neck supple.  Skin:    General: Skin is warm and dry.     Findings: No erythema.  Neurological:     Mental Status: She is alert and oriented to person, place, and time.     Cranial Nerves: No cranial nerve deficit.     Motor: No abnormal muscle tone.     Coordination: Coordination normal.  Psychiatric:        Mood and Affect: Affect normal.       LABORATORY DATA:  I have reviewed the data as listed     Latest Ref Rng & Units 09/07/2022    12:45 PM 08/10/2022   12:53 PM 07/13/2022   12:50 PM  CBC  WBC 4.0 - 10.5 K/uL 4.7  4.3  4.1   Hemoglobin 12.0 - 15.0 g/dL 12.3  11.8  12.5   Hematocrit 36.0 - 46.0 % 37.0  36.2  37.2   Platelets 150 - 400 K/uL 204  199  187       Latest Ref Rng & Units 09/07/2022   12:45 PM 08/10/2022   12:53 PM 07/13/2022   12:50 PM  CMP  Glucose 70 - 99 mg/dL 89  85  82   BUN 8 - 23 mg/dL _0 Creatinine 0.44 - 1.00 mg/dL 1.02  0.94  1.00   Sodium 135 - 145 mmol/L 138  137  137   Potassium 3.5 - 5.1 mmol/L 3.9  3.9  4.4   Chloride 98 - 111 mmol/L 103  104  104   CO2 22 - 32 mmol/L _1 Calcium 8.9 - 10.3 mg/dL 8.9  8.9  9.4   Total Protein 6.5 - 8.1 g/dL 7.0  6.7  7.1   Total Bilirubin 0.3 - 1.2 mg/dL 0.9  1.1  1.1   Alkaline Phos 38 - 126 U/L 44  46  49   AST 15 - 41 U/L _2 ALT 0 - 44 U/L _3 RADIOGRAPHIC STUDIES: I have personally reviewed the radiological images as listed and agreed with the findings in the report. NM Bone Scan Whole Body  Result Date: 06/06/2022 CLINICAL DATA:  breast  cancer, AI use. Known osseous metastatic disease. EXAM: NUCLEAR MEDICINE WHOLE BODY BONE SCAN TECHNIQUE: Whole body anterior and posterior images were obtained approximately 3 hours after intravenous injection of radiopharmaceutical. RADIOPHARMACEUTICALS:  20.01 mCi Technetium-12mMDP IV COMPARISON:  CT dated Feb 24, 2022. FINDINGS: There is focal increased radiotracer uptake throughout the axial and appendicular skeleton. There is increased radiotracer uptake in the sternum, manubrium, and RIGHT medial clavicle consistent with known osseous metastatic disease. There is increased radiotracer uptake throughout multiple levels of the thoracic and lumbar spine. Increased radiotracer uptake is seen most predominately in the inferior cervical spine, approximate T3, T5, T7 and T10 vertebral bodies. There is confluent radiotracer uptake throughout the lower lumbar spine and  throughout the sacrum consistent with known confluent osseous metastatic disease. This extends across bilateral sacroiliac joints, RIGHT greater than LEFT. There is focal increased radiotracer uptake of the RIGHT ischium and RIGHT acetabulum. There is focally increased radiotracer uptake in the RIGHT femoral head and in the mid femoral shaft. There is faint increased radiotracer uptake within the LEFT mid femoral shaft and femoral head. There is increased radiotracer uptake throughout several bilateral ribs, including the LEFT posterior approximate eighth rib, RIGHT anterolateral approximate tenth rib, LEFT anterolateral approximate ninth rib. There is radiotracer uptake within the bilateral shoulders, knees and feet. This is favored to be degenerative in etiology. Expected physiologic distribution of radiotracer. IMPRESSION: Constellation of findings are consistent with multifocal osseous metastatic disease throughout the axial and appendicular skeleton. Of note, osseous metastatic disease involves the RIGHT greater than LEFT femur. Consider evaluation with dedicated hip and femur radiographs to evaluate for overall disease burden and potential risk for pathologic fracture. Electronically Signed   By: SValentino SaxonM.D.   On: 06/06/2022 11:26   CT CHEST ABDOMEN PELVIS W CONTRAST  Result Date: 05/31/2022 CLINICAL DATA:  Restaging metastatic breast cancer. EXAM: CT CHEST, ABDOMEN, AND PELVIS WITH CONTRAST TECHNIQUE: Multidetector CT imaging of the chest, abdomen and pelvis was performed following the standard protocol during bolus administration of intravenous contrast. RADIATION DOSE REDUCTION: This exam was performed according to the departmental dose-optimization program which includes automated exposure control, adjustment of the mA and/or kV according to patient size and/or use of iterative reconstruction technique. body oncCONTRAST:  1064mOMNIPAQUE IOHEXOL 300 MG/ML  SOLN COMPARISON:  02/24/2022  FINDINGS: CT CHEST FINDINGS Cardiovascular: Heart size is normal. There is a small pericardial effusion which appears unchanged from previous exam. Aortic atherosclerosis and coronary artery calcifications. Mediastinum/Nodes: No enlarged mediastinal, hilar, or axillary lymph nodes. Thyroid gland, trachea, and esophagus demonstrate no significant findings. Lungs/Pleura: Stable small bilateral pleural effusions, right greater than left. Unchanged appearance of bilateral interlobular septal thickening and paramediastinal fibrotic changes. Nodularity along the interlobar fissures is identified bilaterally in appears unchanged compared with the previous exam. No suspicious pulmonary parenchymal nodule or mass identified on today's exam. Musculoskeletal: Multifocal sclerotic bone metastases are again noted involving the sternum, lower cervical spine, and thoracic spine. There also scattered sclerotic rib lesions noted bilaterally which are also unchanged in the interval. Unchanged mild, chronic endplate deformities are identified involving the T7 and T10 vertebra. CT ABDOMEN PELVIS FINDINGS Hepatobiliary: Low-density lesion within segment 8/4 is unchanged measuring 8 mm, image 46/2. Ill-defined low-density lesion within segment 4 B is unchanged measuring 1.1 cm, image 52/2. Stable 8 mm lesion within segment 5, image 61/2. Gallbladder appears normal.  No bile duct dilatation. Pancreas: Unremarkable. No pancreatic ductal dilatation or surrounding inflammatory changes. Spleen: Normal in size without focal  abnormality. Adrenals/Urinary Tract: Normal right adrenal gland. Nodule in the left adrenal gland is stable measuring 1.8 by 1.1 cm, image 56/2. No nephrolithiasis, hydronephrosis or suspicious mass. Urinary bladder is unremarkable. Stomach/Bowel: Stomach is nondistended. Colonic diverticulosis. No bowel wall thickening, inflammation, or distension identified. Vascular/Lymphatic: Aortic atherosclerosis. No aneurysm. No  signs of abdominopelvic adenopathy. Reproductive: Uterus and bilateral adnexa are unremarkable. Other: No ascites or focal fluid collections. Musculoskeletal: Multifocal sclerotic bone metastases are again noted involving the proximal appendicular and axial skeleton. Lesions appear similar and multiplicity and size compared with the previous exam. No acute findings. IMPRESSION: 1. Stable CT of the chest, abdomen and pelvis. 2. Unchanged appearance of bilateral pleural effusions, pleural nodularity, and interlobular septal thickening compatible with pleural and lymphangitic metastatic disease. 3. Stable appearance of multifocal sclerotic bone metastases. 4. Multifocal low-attenuation liver lesions are unchanged in the interval. 5. Stable appearance of left adrenal gland metastases. 6. Stable appearance of small pericardial effusion. 7. No new or progressive disease identified. 8. Aortic Atherosclerosis (ICD10-I70.0). Electronically Signed   By: Taylor  Stroud M.D.   On: 05/31/2022 08:54    

## 2022-09-07 NOTE — Assessment & Plan Note (Signed)
Chemotherapy plan as listed above 

## 2022-09-07 NOTE — Assessment & Plan Note (Signed)
#  Grade 2 neuropathy, continue gabapentin,  Continue gabapentin 600 mg in a.m., 900 mg at bedtime.

## 2022-09-07 NOTE — Assessment & Plan Note (Signed)
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 CT scan  was reviewed and discussed with patient. Stable disease.  Continue Abemaciclib 100 mg twice daily plus letrozole 2.49m daily.

## 2022-09-07 NOTE — Assessment & Plan Note (Signed)
Intermittent A-fib, now on Eliquis 5 mg twice daily. Abemaciclib is associated with <5% risk of A-fib. Patient has increased risk of bleeding.  Close monitor.   Continue follow-up with cardiology. 

## 2022-09-07 NOTE — Assessment & Plan Note (Signed)
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 She is doing well clinically and prefers to follow up every 8 weeks.  I will adjust her Zometa treatments to every 8 weeks.  Continue calcium and vitamin D supplementation. Amber Blevins

## 2022-09-08 LAB — CANCER ANTIGEN 15-3: CA 15-3: 32.9 U/mL — ABNORMAL HIGH (ref 0.0–25.0)

## 2022-09-08 LAB — CANCER ANTIGEN 27.29: CA 27.29: 36 U/mL (ref 0.0–38.6)

## 2022-09-17 ENCOUNTER — Other Ambulatory Visit (HOSPITAL_COMMUNITY): Payer: Self-pay

## 2022-09-23 ENCOUNTER — Other Ambulatory Visit (HOSPITAL_COMMUNITY): Payer: Self-pay

## 2022-09-24 ENCOUNTER — Telehealth: Payer: Self-pay

## 2022-09-24 NOTE — Telephone Encounter (Signed)
Oral Oncology Patient Advocate Encounter   Began application for assistance for Verzenio through [employer].   Application will be submitted upon completion of necessary supporting documentation.   Lilly[facility] phone number [phone].   I will continue to check the status until final determination.   Berdine Addison, [facility] Patient [place]  [phone] (phone) [phone] (fax) 09/24/2022 9:59 AM

## 2022-09-24 NOTE — Telephone Encounter (Signed)
Patient is coming to office to sign paperwork today 09/24/22.  Berdine Addison, [facility] Patient [place]  [phone] (phone) [phone] (fax) 09/24/2022 9:59 AM

## 2022-09-24 NOTE — Telephone Encounter (Signed)
Oral Oncology Patient Advocate Encounter   Submitted application for assistance for Verzenio to [employer].   Application submitted via e-fax to [phone]   Lilly[facility] phone number [phone].   I will continue to check the status until final determination.   Berdine Addison, [facility] Patient [place]  [phone] (phone) [phone] (fax) 09/24/2022 2:07 PM

## 2022-09-25 NOTE — Telephone Encounter (Signed)
Oral Oncology Patient Advocate Encounter   Received notification that the application for assistance for Verzenio through [employer] has been approved.   [employer] phone number [phone].   Effective dates: 09/24/22 through 09/28/23  I have spoken to the patient.  Amber Blevins, [facility] Patient [place]  [phone] (phone) [phone] (fax) 09/25/2022 8:08 AM

## 2022-10-13 ENCOUNTER — Inpatient Hospital Stay: Payer: Medicare PPO

## 2022-10-14 ENCOUNTER — Other Ambulatory Visit (HOSPITAL_COMMUNITY): Payer: Self-pay

## 2022-10-18 ENCOUNTER — Other Ambulatory Visit: Payer: Self-pay | Admitting: Oncology

## 2022-11-02 ENCOUNTER — Encounter: Payer: Self-pay | Admitting: Oncology

## 2022-11-02 ENCOUNTER — Inpatient Hospital Stay: Payer: Medicare PPO | Attending: Oncology

## 2022-11-02 ENCOUNTER — Inpatient Hospital Stay: Payer: Medicare PPO

## 2022-11-02 ENCOUNTER — Inpatient Hospital Stay: Payer: Medicare PPO | Admitting: Oncology

## 2022-11-02 VITALS — BP 131/84 | HR 96 | Temp 97.7°F | Wt 184.0 lb

## 2022-11-02 DIAGNOSIS — C787 Secondary malignant neoplasm of liver and intrahepatic bile duct: Secondary | ICD-10-CM | POA: Diagnosis not present

## 2022-11-02 DIAGNOSIS — I4891 Unspecified atrial fibrillation: Secondary | ICD-10-CM

## 2022-11-02 DIAGNOSIS — Z923 Personal history of irradiation: Secondary | ICD-10-CM | POA: Diagnosis not present

## 2022-11-02 DIAGNOSIS — Z79899 Other long term (current) drug therapy: Secondary | ICD-10-CM | POA: Insufficient documentation

## 2022-11-02 DIAGNOSIS — T451X5A Adverse effect of antineoplastic and immunosuppressive drugs, initial encounter: Secondary | ICD-10-CM | POA: Insufficient documentation

## 2022-11-02 DIAGNOSIS — Z170 Estrogen receptor positive status [ER+]: Secondary | ICD-10-CM | POA: Diagnosis not present

## 2022-11-02 DIAGNOSIS — C7951 Secondary malignant neoplasm of bone: Secondary | ICD-10-CM | POA: Diagnosis present

## 2022-11-02 DIAGNOSIS — Z87891 Personal history of nicotine dependence: Secondary | ICD-10-CM | POA: Diagnosis not present

## 2022-11-02 DIAGNOSIS — C7972 Secondary malignant neoplasm of left adrenal gland: Secondary | ICD-10-CM | POA: Insufficient documentation

## 2022-11-02 DIAGNOSIS — Z79811 Long term (current) use of aromatase inhibitors: Secondary | ICD-10-CM | POA: Diagnosis not present

## 2022-11-02 DIAGNOSIS — C50919 Malignant neoplasm of unspecified site of unspecified female breast: Secondary | ICD-10-CM | POA: Diagnosis not present

## 2022-11-02 DIAGNOSIS — C7971 Secondary malignant neoplasm of right adrenal gland: Secondary | ICD-10-CM | POA: Diagnosis not present

## 2022-11-02 DIAGNOSIS — I480 Paroxysmal atrial fibrillation: Secondary | ICD-10-CM | POA: Diagnosis not present

## 2022-11-02 DIAGNOSIS — Z5111 Encounter for antineoplastic chemotherapy: Secondary | ICD-10-CM

## 2022-11-02 DIAGNOSIS — C50912 Malignant neoplasm of unspecified site of left female breast: Secondary | ICD-10-CM | POA: Insufficient documentation

## 2022-11-02 DIAGNOSIS — G620 Drug-induced polyneuropathy: Secondary | ICD-10-CM | POA: Insufficient documentation

## 2022-11-02 DIAGNOSIS — Z7901 Long term (current) use of anticoagulants: Secondary | ICD-10-CM | POA: Insufficient documentation

## 2022-11-02 DIAGNOSIS — I10 Essential (primary) hypertension: Secondary | ICD-10-CM | POA: Diagnosis not present

## 2022-11-02 LAB — COMPREHENSIVE METABOLIC PANEL
ALT: 17 U/L (ref 0–44)
AST: 27 U/L (ref 15–41)
Albumin: 3.8 g/dL (ref 3.5–5.0)
Alkaline Phosphatase: 44 U/L (ref 38–126)
Anion gap: 7 (ref 5–15)
BUN: 20 mg/dL (ref 8–23)
CO2: 25 mmol/L (ref 22–32)
Calcium: 9.1 mg/dL (ref 8.9–10.3)
Chloride: 108 mmol/L (ref 98–111)
Creatinine, Ser: 0.97 mg/dL (ref 0.44–1.00)
GFR, Estimated: >60 mL/min (ref >60)
Glucose, Bld: 103 mg/dL — ABNORMAL HIGH (ref 70–99)
Potassium: 3.9 mmol/L (ref 3.5–5.1)
Sodium: 140 mmol/L (ref 135–145)
Total Bilirubin: 0.7 mg/dL (ref 0.3–1.2)
Total Protein: 6.6 g/dL (ref 6.5–8.1)

## 2022-11-02 LAB — CBC WITH DIFFERENTIAL/PLATELET
Abs Immature Granulocytes: 0.02 10*3/uL (ref 0.00–0.07)
Basophils Absolute: 0 10*3/uL (ref 0.0–0.1)
Basophils Relative: 1 %
Eosinophils Absolute: 0.1 10*3/uL (ref 0.0–0.5)
Eosinophils Relative: 2 %
HCT: 34.8 % — ABNORMAL LOW (ref 36.0–46.0)
Hemoglobin: 12 g/dL (ref 12.0–15.0)
Immature Granulocytes: 1 %
Lymphocytes Relative: 19 %
Lymphs Abs: 0.7 10*3/uL (ref 0.7–4.0)
MCH: 32.8 pg (ref 26.0–34.0)
MCHC: 34.5 g/dL (ref 30.0–36.0)
MCV: 95.1 fL (ref 80.0–100.0)
Monocytes Absolute: 0.4 10*3/uL (ref 0.1–1.0)
Monocytes Relative: 11 %
Neutro Abs: 2.5 10*3/uL (ref 1.7–7.7)
Neutrophils Relative %: 66 %
Platelets: 183 10*3/uL (ref 150–400)
RBC: 3.66 MIL/uL — ABNORMAL LOW (ref 3.87–5.11)
RDW: 13.9 % (ref 11.5–15.5)
WBC: 3.7 10*3/uL — ABNORMAL LOW (ref 4.0–10.5)
nRBC: 0 % (ref 0.0–0.2)

## 2022-11-02 MED ORDER — SODIUM CHLORIDE 0.9 % IV SOLN
INTRAVENOUS | Status: DC
  Filled 2022-11-02: qty 250

## 2022-11-02 MED ORDER — LETROZOLE 2.5 MG PO TABS: 2.5000 mg | ORAL_TABLET | Freq: Every day | ORAL | 2 refills | Status: DC

## 2022-11-02 MED ORDER — ZOLEDRONIC ACID 4 MG/100ML IV SOLN
4.0000 mg | Freq: Once | INTRAVENOUS | Status: AC
  Administered 2022-11-02: 4 mg via INTRAVENOUS
  Filled 2022-11-02: qty 100

## 2022-11-02 MED ORDER — HEPARIN SOD (PORK) LOCK FLUSH 100 UNIT/ML IV SOLN
500.0000 [IU] | Freq: Once | INTRAVENOUS | Status: AC | PRN
  Administered 2022-11-02: 500 [IU]
  Filled 2022-11-02: qty 5

## 2022-11-02 NOTE — Assessment & Plan Note (Signed)
Intermittent A-fib, now on Eliquis 5 mg twice daily. Abemaciclib is associated with <5% risk of A-fib. Patient has increased risk of bleeding.  Close monitor.   Continue follow-up with cardiology. 

## 2022-11-02 NOTE — Assessment & Plan Note (Signed)
#  Grade 2 neuropathy, continue gabapentin,  Continue gabapentin 600 mg in a.m., 900 mg at bedtime.

## 2022-11-02 NOTE — Progress Notes (Signed)
Hematology/Oncology Progress note Telephone:([phone]) [phone] Fax:(336) [phone]     ASSESSMENT & PLAN:   Cancer Staging  Primary malignant neoplasm of breast with metastasis ([place]) Staging form: Breast, AJCC 8th Edition - Clinical stage from 07/12/2021: Stage IV (rcTX, cNX, pM1, ER+, PR+, HER2-) - Signed by [name], MD on 03/08/2022   Primary malignant neoplasm of breast with metastasis ([place]) Metastatic Breast cancer with extensive thoracic and bone involvement, in visceral crisis, ER+, PR+ HER2 neg Labs reviewed and discussed with patient. CT scan  was reviewed and discussed with patient. Stable disease.  Continue Abemaciclib 100 mg twice daily plus letrozole 2.'5mg'$  daily. Obtain [facility] NGS  A-fib (HCC) Intermittent A-fib, now on Eliquis 5 mg twice daily. Abemaciclib is associated with <5% risk of A-fib. Patient has increased risk of bleeding.  Close monitor.   Continue follow-up with cardiology.  Chemotherapy-induced neuropathy (HCC) #Grade 2 neuropathy, continue gabapentin,  Continue gabapentin 600 mg in a.m., 900 mg at bedtime.   Encounter for antineoplastic chemotherapy Chemotherapy plan as listed above   Metastasis to bone [facility]) 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 Zometa Q 8 weeks.  Continue calcium and vitamin D supplementation. .    Orders Placed This Encounter  Procedures   CT CHEST ABDOMEN PELVIS W CONTRAST    Standing Status:   Future    Standing Expiration Date:   11/03/2023    Order Specific Question:   If indicated for the ordered procedure, I authorize the administration of contrast media per Radiology protocol    Answer:   Yes    Order Specific Question:   Does the patient have a contrast media/X-ray dye allergy?    Answer:   No    Order Specific Question:   Preferred imaging location?    Answer:   [facility]    Order Specific Question:   Is Oral Contrast  requested for this exam?    Answer:   Yes, Per Radiology protocol   CBC with Differential/Platelet    Standing Status:   Future    Standing Expiration Date:   11/02/2023   Comprehensive metabolic panel    Standing Status:   Future    Standing Expiration Date:   11/02/2023   Cancer antigen 15-3    Standing Status:   Future    Standing Expiration Date:   11/03/2023   Cancer antigen 27.29    Standing Status:   Future    Standing Expiration Date:   11/03/2023   Follow up 4 weeks All questions were answered. The patient knows to call the clinic with any problems, questions or concerns.  [name], MD, PhD [facility] Hematology Oncology 11/02/2022     CHIEF COMPLAINTS/REASON FOR VISIT:  metastatic breast caner  HISTORY OF PRESENTING ILLNESS:   Amber Blevins is a  72 y.o.  female with PMH listed below presents for follow up for treatment of metastatic breast cancer.   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 Additional metastatic involvement T12, L1 through L3.  No primary tumor site identified. Top differential considerations are Metastatic Disease Unknown primary, less likely Lymphoma or Multiple Myeloma.   2. Superimposed lumbar spine degeneration, including degenerative moderate to severe left L3 and L4 nerve level impingement from disc herniation.      07/02/2021 Imaging    CT chest abdomen pelvis showed innumerable small pulmonary and pleural nodules consistent with diffuse metastasis.  Associated with probable malignant pleural effusion.  Mediastinal and hilar lymphadenopathy consistent with metastatic disease.  Hepatic and bilateral adrenal gland metastasis.  Diffuse extensive destructive metastatic bone disease involving pelvis.   07/12/2021 Cancer Staging   Staging form: Breast, AJCC 8th Edition - Clinical stage from 07/12/2021: Stage IV (rcTX, cNX, pM1, ER+, PR+, HER2-) - Signed by [name], MD on 03/08/2022 Stage prefix: Recurrence   07/17/2021 Initial Diagnosis   Metastatic breast cancer -history of breast cancer, diagnosed in 2010 Left-sided T2 (2.4cm) N0, ER/PR positive, HER2 negative IDC of the breast, s/p lumpectomy by Dr.Meyer at [facility] Oncotype score of 11 who completed radiation and she finished 10 years of Femara from 05/2009 and stopped in 2020.  -07/12/2021 patient underwent right thoracentesis.  Cytology was positive for metastatic carcinoma, compatible with breast origin.  ER/PR +, HER2 negative -07/16/2021, patient underwent iliac bone biopsy.  Positive for metastatic carcinoma.   07/21/2021 - 09/16/2021 Chemotherapy   Patient is on Treatment Plan : BREAST Paclitaxel D1,8,15 q28d     09/04/2021 Imaging   CT chest abdomen pelvis showed improvement of the pulmonary nodularity and thickening in the left and the right lung.  Persistent nodular interstitial and pleural thickening remains.  Improvement of mediastinal and hilar adenopathy, hepatic metastatic lesions are similar.  Adrenal gland metastasis is similar.  Interval increase in the sclerotic bone metastasis.    09/30/2021 -  Chemotherapy   started on abemaciclib 100 mg twice daily and letrozole.   02/24/2022 Imaging    CT chest abdomen pelvis with contrast showed unchanged bilateral pleural effusion with associated intralobular septal thickening of the lung bases.  Without discrete nodularity  consistent with stable appearance of treated pleural and lymphangitic metastatic disease.  Stable or minimally diminished subcentimeter liver lesions.  Unchanged left adrenal metastasis.  Unchanged widespread sclerotic osseous metastatic disease involving the included axial and appendicular skeleton.  No evidence of new metastatic disease in the chest abdomen or pelvis.  Coronary artery disease.   03/06/2022 Procedure   Ultrasound guided thoracentesis, removal of 859m pleural fluid.    05/29/2022 Imaging   CT chest abdomen pelvis 1. Stable CT of the chest, abdomen and pelvis. 2. Unchanged appearance of bilateral pleural effusions, pleural nodularity, and interlobular septal thickening compatible with pleural and lymphangitic metastatic disease. 3. Stable appearance of multifocal sclerotic bone metastases. 4. Multifocal low-attenuation liver lesions are unchanged in the interval. 5. Stable appearance of left adrenal gland metastases. 6. Stable appearance of small pericardial effusion.7. No new or progressive disease identified.8. Aortic Atherosclerosis (ICD10-I70.0).   06/06/2022 Imaging   Bone scan Constellation of findings are consistent with multifocal osseous metastatic disease throughout the axial and appendicular skeleton.Of note, osseous metastatic disease involves the RIGHT greater than LEFT femur. Consider evaluation with dedicated hip and femur radiographs to evaluate for overall disease burden and potential risk for pathologic fracture.   08/31/2022 Imaging   CT chest abdomen pelvis w contrast 1. Unchanged small  right, trace left pleural effusions with associated diffuse interlobular septal thickening and fine fissural and perilymphatic nodularity throughout the lungs, findings consistent with lymphangitic metastatic disease. 2. Stable or slightly diminished very subtle hypodense lesion of the central right lobe of the liver, hepatic segment VIII measuring 0.6 cm, previously 0.8 cm.  Additional unchanged, very subtle hypodense lesion of hepatic segment IV B measuring 1.1 cm. 3. Unchanged widespread sclerotic osseous metastatic disease throughout the included axial and proximal appendicular skeleton, particularly dense in the lower lumbar spine and sacrum. 4. Unchanged left adrenal nodule measuring 1.7 x 1.1 cm. 5. Overall constellation of findings is consistent with stable metastatic disease. 6. Coronary artery disease.     Patient follows up with cardiology for evaluation of bradycardia.  Cardiology notes reviewed.  72-hour Holter monitor revealed revealed predominant sinus rhythm with mean heart rate of 96 bpm, heart rate range 53 to 129 bpm, frequent premature ventricular contractions (27% burden), and intermittent atrial fibrillation (5% burden) with longest episode 3 hours. CHA2DS2-VASc score of 3, per cardiology recommendation patient was started on Eliquis 5 mg twice daily.     INTERVAL HISTORY ARELIS NEUMEIER is a 72 y.o. female who has above history reviewed by me today presents for follow up visit for metastatic breast cancer She has no new complaints. Feeling well. Denies any new bone pain.   Review of Systems  Constitutional:  Negative for appetite change, chills, fatigue and fever.  HENT:   Negative for hearing loss and voice change.   Eyes:  Negative for eye problems.  Respiratory:  Positive for shortness of breath. Negative for chest tightness and cough.   Cardiovascular:  Negative for chest pain.  Gastrointestinal:  Negative for abdominal distention, abdominal pain and blood in stool.  Endocrine: Negative for hot flashes.  Genitourinary:  Negative for difficulty urinating and frequency.   Musculoskeletal:  Negative for arthralgias.  Skin:  Negative for itching and rash.  Neurological:  Positive for numbness. Negative for extremity weakness.  Hematological:  Negative for adenopathy.  Psychiatric/Behavioral:  Negative for confusion.       MEDICAL HISTORY:  Past Medical History:  Diagnosis Date   A-fib ([place])    Breast cancer (HCC)    COPD (chronic obstructive pulmonary disease) ([place])    Family history of adverse reaction to anesthesia    brother has problem with coming out of anesthesia   High cholesterol    Hypertension    Personal history of radiation therapy    Pre-diabetes     SURGICAL HISTORY: Past Surgical History:  Procedure Laterality Date   BREAST LUMPECTOMY Left    2010   COLONOSCOPY     EYE SURGERY Left    Cataract surgery   PORTACATH PLACEMENT N/A 08/01/2021   Procedure: INSERTION PORT-A-CATH;  Surgeon: Herbert Pun, MD;  Location: [facility] ORS;  Service: General;  Laterality: N/A;   thorocentesis Right 07/10/2021   and another on 07/25/21    SOCIAL HISTORY: Social History   Socioeconomic History   Marital status: Married    Spouse name: Not on file   Number of children: Not on file   Years of education: Not on file   Highest education level: Not on file  Occupational History   Not on file  Tobacco Use   Smoking status: Former    Packs/day: 1.00    Years: 25.00    Total pack years: 25.00    Types: Cigarettes    Quit date: 1990    Years since  quitting: 34.1   Smokeless tobacco: Never  Vaping Use   Vaping Use: Never used  Substance and Sexual Activity   Alcohol use: Not Currently   Drug use: Never   Sexual activity: Not on file  Other Topics Concern   Not on file  Social History Narrative   Not on file   Social Determinants of Health   Financial Resource Strain: Not on file  Food Insecurity: Not on file  Transportation Needs: Not on file  Physical Activity: Not on file  Stress: Not on file  Social Connections: Not on file  Intimate Partner Violence: Not on file    FAMILY HISTORY: Family History  Problem Relation Age of Onset   Cancer Mother        gynecological   Lung cancer Mother    Diabetes Father    Heart disease Father    Parkinson's disease Father     Brain cancer Brother    Bladder Cancer Brother    Pulmonary disease Brother    Rheumatic fever Brother    Breast cancer Neg Hx     ALLERGIES:  is allergic to green tea (camellia sinensis), melaleuca viridiflora, tea tree oil, and lisinopril.  MEDICATIONS:  Current Outpatient Medications  Medication Sig Dispense Refill   abemaciclib (VERZENIO) 100 MG tablet Take 1 tablet (100 mg total) by mouth 2 (two) times daily. Swallow tablets whole. Do not chew, crush, or split tablets before swallowing. 56 tablet 3   acetaminophen (TYLENOL) 650 MG CR tablet Take 650 mg by mouth every 8 (eight) hours as needed for pain.     atorvastatin (LIPITOR) 40 MG tablet Take 40 mg by mouth at bedtime.     Boswellia-Glucosamine-Vit D (OSTEO BI-FLEX-GLUCOS/5-LOXIN) TABS Take 1 capsule by mouth in the morning and at bedtime.     Calcium Carb-Cholecalciferol 600-400 MG-UNIT TABS Take 1 tablet by mouth daily.     cyanocobalamin 1000 MCG tablet Take 1,000 mcg by mouth daily.     diphenoxylate-atropine (LOMOTIL) 2.5-0.025 MG tablet Take 1 tablet by mouth 4 (four) times daily as needed for diarrhea or loose stools. 30 tablet 0   Docusate Sodium (DSS) 100 MG CAPS Take 1 capsule by mouth every other day.     ELIQUIS 5 MG TABS tablet Take 5 mg by mouth 2 (two) times daily.     gabapentin (NEURONTIN) 300 MG capsule TAKE 2 CAPSULES BY MOUTH ONCE DAILY IN THE MORNING AND 3 ONCE DAILY IN THE EVENING 150 capsule 0   lidocaine-prilocaine (EMLA) cream Apply 1 application. topically as needed. 30 g 5   Loperamide HCl (IMODIUM PO) Take by mouth.     losartan (COZAAR) 50 MG tablet Take 50 mg by mouth daily.     magnesium chloride (SLOW-MAG) 64 MG TBEC SR tablet Take 1 tablet by mouth at bedtime.     melatonin (MELATONIN MAXIMUM STRENGTH) 5 MG TABS Take 1 tablet by mouth at bedtime.     prochlorperazine (COMPAZINE) 5 MG tablet Take 5 mg by mouth every 6 (six) hours as needed for nausea or vomiting.     Turmeric (QC TUMERIC  COMPLEX PO) Take 1 capsule by mouth at bedtime.     letrozole (FEMARA) 2.5 MG tablet Take 1 tablet (2.5 mg total) by mouth daily. 90 tablet 2   LORazepam (ATIVAN) 0.5 MG tablet TAKE 1 TABLET BY MOUTH AT BEDTIME AS NEEDED FOR ANXIETY (Patient not taking: Reported on 06/09/2022) 30 tablet 0   ondansetron (ZOFRAN) 8 MG tablet Take  by mouth every 8 (eight) hours as needed for nausea or vomiting. (Patient not taking: Reported on 11/02/2022)     potassium chloride SA (KLOR-CON M) 20 MEQ tablet Take 1 tablet (20 mEq total) by mouth daily. (Patient not taking: Reported on 08/10/2022) 30 tablet 0   No current facility-administered medications for this visit.   Facility-Administered Medications Ordered in Other Visits  Medication Dose Route Frequency Provider Last Rate Last Admin   0.9 %  sodium chloride infusion   Intravenous Continuous [name], MD   Stopped at 11/02/22 1024     PHYSICAL EXAMINATION: ECOG PERFORMANCE STATUS: 1 - Symptomatic but completely ambulatory Vitals:   11/02/22 0918  BP: 131/84  Pulse: 96  Temp: 97.7 F (36.5 C)  SpO2: 100%   Filed Weights   11/02/22 0918  Weight: 184 lb (83.5 kg)    Physical Exam Constitutional:      General: She is not in acute distress.    Appearance: She is not diaphoretic.  HENT:     Head: Normocephalic and atraumatic.     Nose: Nose normal.     Mouth/Throat:     Pharynx: No oropharyngeal exudate.  Eyes:     General: No scleral icterus.    Pupils: Pupils are equal, round, and reactive to light.  Cardiovascular:     Rate and Rhythm: Normal rate and regular rhythm.     Heart sounds: No murmur heard. Pulmonary:     Effort: Pulmonary effort is normal. No respiratory distress.     Comments: Decreased breath sound bilaterally. Abdominal:     General: There is no distension.     Palpations: Abdomen is soft.     Tenderness: There is no abdominal tenderness.  Musculoskeletal:        General: Normal range of motion.     Cervical back:  Normal range of motion and neck supple.  Skin:    General: Skin is warm and dry.     Findings: No erythema.  Neurological:     Mental Status: She is alert and oriented to person, place, and time.     Cranial Nerves: No cranial nerve deficit.     Motor: No abnormal muscle tone.     Coordination: Coordination normal.  Psychiatric:        Mood and Affect: Affect normal.       LABORATORY DATA:  I have reviewed the data as listed     Latest Ref Rng & Units 11/02/2022    8:58 AM 09/07/2022   12:45 PM 08/10/2022   12:53 PM  CBC  WBC 4.0 - 10.5 K/uL 3.7  4.7  4.3   Hemoglobin 12.0 - 15.0 g/dL 12.0  12.3  11.8   Hematocrit 36.0 - 46.0 % 34.8  37.0  36.2   Platelets 150 - 400 K/uL 183  204  199       Latest Ref Rng & Units 11/02/2022    8:58 AM 09/07/2022   12:45 PM 08/10/2022   12:53 PM  CMP  Glucose 70 - 99 mg/dL 103  89  85   BUN 8 - 23 mg/dL '20  15  17   '$ Creatinine 0.44 - 1.00 mg/dL 0.97  1.02  0.94   Sodium 135 - 145 mmol/L 140  138  137   Potassium 3.5 - 5.1 mmol/L 3.9  3.9  3.9   Chloride 98 - 111 mmol/L 108  103  104   CO2 22 - 32 mmol/L 25  26  25   Calcium 8.9 - 10.3 mg/dL 9.1  8.9  8.9   Total Protein 6.5 - 8.1 g/dL 6.6  7.0  6.7   Total Bilirubin 0.3 - 1.2 mg/dL 0.7  0.9  1.1   Alkaline Phos 38 - 126 U/L 44  44  46   AST 15 - 41 U/L '27  29  22   '$ ALT 0 - 44 U/L '17  16  17      '$ RADIOGRAPHIC STUDIES: I have personally reviewed the radiological images as listed and agreed with the findings in the report. CT CHEST ABDOMEN PELVIS W CONTRAST  Result Date: 08/31/2022 CLINICAL DATA:  Metastatic breast cancer restaging, osseous metastatic disease * Tracking Code: BO * EXAM: CT CHEST, ABDOMEN, AND PELVIS WITH CONTRAST TECHNIQUE: Multidetector CT imaging of the chest, abdomen and pelvis was performed following the standard protocol during bolus administration of intravenous contrast. RADIATION DOSE REDUCTION: This exam was performed according to the departmental  dose-optimization program which includes automated exposure control, adjustment of the mA and/or kV according to patient size and/or use of iterative reconstruction technique. CONTRAST:  160m OMNIPAQUE IOHEXOL 300 MG/ML  SOLN COMPARISON:  05/29/2022 FINDINGS: CT CHEST FINDINGS Cardiovascular: Right chest port catheter. Aortic atherosclerosis. Normal heart size. Left and right coronary artery calcifications. Unchanged trace pericardial effusion. Mediastinum/Nodes: No enlarged mediastinal, hilar, or axillary lymph nodes. Thyroid gland, trachea, and esophagus demonstrate no significant findings. Lungs/Pleura: Lungs are clear. Unchanged small right, trace left pleural effusions. Unchanged, minimal interlobular septal thickening and fine, generally fissural and perilymphatic nodularity throughout the lungs. Musculoskeletal: No chest wall abnormality. No acute osseous findings. CT ABDOMEN PELVIS FINDINGS Hepatobiliary: Stable or slightly diminished very subtle hypodense lesion of the central right lobe of the liver, hepatic segment VIII measuring 0.6 cm, previously 0.8 cm (series 2, image 45). Additional unchanged, very subtle hypodense lesion of hepatic segment IV B measuring 1.1 cm (series 2, image 51). No gallstones, gallbladder wall thickening, or biliary dilatation. Pancreas: Unremarkable. No pancreatic ductal dilatation or surrounding inflammatory changes. Spleen: Normal in size without significant abnormality. Adrenals/Urinary Tract: Unchanged soft tissue attenuation left adrenal nodule measuring 1.7 x 1.1 cm (series 2, image 63) kidneys are normal, without renal calculi, solid lesion, or hydronephrosis. Bladder is unremarkable. Stomach/Bowel: Stomach is within normal limits. Appendix appears normal. No evidence of bowel wall thickening, distention, or inflammatory changes. Descending and sigmoid diverticulosis. Vascular/Lymphatic: Aortic atherosclerosis. No enlarged abdominal or pelvic lymph nodes. Reproductive:  No mass or other abnormality. Other: No abdominal wall hernia or abnormality. No ascites. Musculoskeletal: No acute osseous findings. Unchanged widespread sclerotic osseous metastatic disease throughout the included axial and proximal appendicular skeleton, particularly dense in the lower lumbar spine and sacrum (series 2, image 93). IMPRESSION: 1. Unchanged small right, trace left pleural effusions with associated diffuse interlobular septal thickening and fine fissural and perilymphatic nodularity throughout the lungs, findings consistent with lymphangitic metastatic disease. 2. Stable or slightly diminished very subtle hypodense lesion of the central right lobe of the liver, hepatic segment VIII measuring 0.6 cm, previously 0.8 cm. Additional unchanged, very subtle hypodense lesion of hepatic segment IV B measuring 1.1 cm. 3. Unchanged widespread sclerotic osseous metastatic disease throughout the included axial and proximal appendicular skeleton, particularly dense in the lower lumbar spine and sacrum. 4. Unchanged left adrenal nodule measuring 1.7 x 1.1 cm. 5. Overall constellation of findings is consistent with stable metastatic disease. 6. Coronary artery disease. Aortic Atherosclerosis (ICD10-I70.0). Electronically Signed   By: AJamse MeadD.  On: 08/31/2022 16:47

## 2022-11-02 NOTE — Assessment & Plan Note (Signed)
Chemotherapy plan as listed above 

## 2022-11-02 NOTE — Assessment & Plan Note (Addendum)
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 CT scan  was reviewed and discussed with patient. Stable disease.  Continue Abemaciclib 100 mg twice daily plus letrozole 2.'5mg'$  daily. Obtain [facility] NGS

## 2022-11-02 NOTE — Assessment & Plan Note (Addendum)
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 Zometa Q 8 weeks.  Continue calcium and vitamin D supplementation. .   

## 2022-11-03 LAB — CANCER ANTIGEN 27.29: CA 27.29: 34.2 U/mL (ref 0.0–38.6)

## 2022-11-03 LAB — CANCER ANTIGEN 15-3: CA 15-3: 28.8 U/mL — ABNORMAL HIGH (ref 0.0–25.0)

## 2022-11-26 ENCOUNTER — Ambulatory Visit
Admission: RE | Admit: 2022-11-26 | Discharge: 2022-11-26 | Disposition: A | Discharge status: Home / Self Care | Payer: Medicare PPO | Source: Ambulatory Visit | Attending: Oncology | Admitting: Oncology

## 2022-11-26 DIAGNOSIS — K5730 Diverticulosis of large intestine without perforation or abscess without bleeding: Secondary | ICD-10-CM | POA: Diagnosis not present

## 2022-11-26 DIAGNOSIS — J90 Pleural effusion, not elsewhere classified: Secondary | ICD-10-CM | POA: Insufficient documentation

## 2022-11-26 DIAGNOSIS — C50919 Malignant neoplasm of unspecified site of unspecified female breast: Secondary | ICD-10-CM

## 2022-11-26 DIAGNOSIS — C7951 Secondary malignant neoplasm of bone: Secondary | ICD-10-CM | POA: Diagnosis not present

## 2022-11-26 DIAGNOSIS — K769 Liver disease, unspecified: Secondary | ICD-10-CM | POA: Diagnosis not present

## 2022-11-26 MED ORDER — IOHEXOL 300 MG/ML  SOLN
100.0000 mL | Freq: Once | INTRAMUSCULAR | Status: AC | PRN
  Administered 2022-11-26: 100 mL via INTRAVENOUS

## 2022-12-01 ENCOUNTER — Other Ambulatory Visit (HOSPITAL_COMMUNITY): Payer: Self-pay

## 2022-12-01 ENCOUNTER — Inpatient Hospital Stay: Payer: Medicare PPO | Attending: Oncology

## 2022-12-01 ENCOUNTER — Encounter: Payer: Self-pay | Admitting: Oncology

## 2022-12-01 ENCOUNTER — Telehealth: Payer: Self-pay

## 2022-12-01 ENCOUNTER — Inpatient Hospital Stay: Payer: Medicare PPO | Admitting: Oncology

## 2022-12-01 ENCOUNTER — Other Ambulatory Visit: Payer: Self-pay

## 2022-12-01 ENCOUNTER — Other Ambulatory Visit: Payer: Self-pay | Admitting: Pharmacist

## 2022-12-01 VITALS — BP 134/67 | HR 47 | Temp 98.0°F | Resp 18 | Wt 184.6 lb

## 2022-12-01 DIAGNOSIS — Z79899 Other long term (current) drug therapy: Secondary | ICD-10-CM | POA: Insufficient documentation

## 2022-12-01 DIAGNOSIS — C787 Secondary malignant neoplasm of liver and intrahepatic bile duct: Secondary | ICD-10-CM | POA: Insufficient documentation

## 2022-12-01 DIAGNOSIS — C50912 Malignant neoplasm of unspecified site of left female breast: Secondary | ICD-10-CM | POA: Diagnosis not present

## 2022-12-01 DIAGNOSIS — T451X5A Adverse effect of antineoplastic and immunosuppressive drugs, initial encounter: Secondary | ICD-10-CM

## 2022-12-01 DIAGNOSIS — Z7901 Long term (current) use of anticoagulants: Secondary | ICD-10-CM | POA: Insufficient documentation

## 2022-12-01 DIAGNOSIS — Z170 Estrogen receptor positive status [ER+]: Secondary | ICD-10-CM | POA: Insufficient documentation

## 2022-12-01 DIAGNOSIS — J90 Pleural effusion, not elsewhere classified: Secondary | ICD-10-CM | POA: Insufficient documentation

## 2022-12-01 DIAGNOSIS — I4891 Unspecified atrial fibrillation: Secondary | ICD-10-CM | POA: Diagnosis not present

## 2022-12-01 DIAGNOSIS — Z79811 Long term (current) use of aromatase inhibitors: Secondary | ICD-10-CM | POA: Insufficient documentation

## 2022-12-01 DIAGNOSIS — C7972 Secondary malignant neoplasm of left adrenal gland: Secondary | ICD-10-CM | POA: Diagnosis not present

## 2022-12-01 DIAGNOSIS — C7971 Secondary malignant neoplasm of right adrenal gland: Secondary | ICD-10-CM | POA: Diagnosis not present

## 2022-12-01 DIAGNOSIS — Z5111 Encounter for antineoplastic chemotherapy: Secondary | ICD-10-CM

## 2022-12-01 DIAGNOSIS — Z9221 Personal history of antineoplastic chemotherapy: Secondary | ICD-10-CM | POA: Insufficient documentation

## 2022-12-01 DIAGNOSIS — C7951 Secondary malignant neoplasm of bone: Secondary | ICD-10-CM

## 2022-12-01 DIAGNOSIS — I480 Paroxysmal atrial fibrillation: Secondary | ICD-10-CM | POA: Insufficient documentation

## 2022-12-01 DIAGNOSIS — C7989 Secondary malignant neoplasm of other specified sites: Secondary | ICD-10-CM | POA: Diagnosis not present

## 2022-12-01 DIAGNOSIS — Z95828 Presence of other vascular implants and grafts: Secondary | ICD-10-CM

## 2022-12-01 DIAGNOSIS — G620 Drug-induced polyneuropathy: Secondary | ICD-10-CM | POA: Insufficient documentation

## 2022-12-01 DIAGNOSIS — C50919 Malignant neoplasm of unspecified site of unspecified female breast: Secondary | ICD-10-CM | POA: Diagnosis not present

## 2022-12-01 DIAGNOSIS — Z923 Personal history of irradiation: Secondary | ICD-10-CM | POA: Diagnosis not present

## 2022-12-01 LAB — CBC WITH DIFFERENTIAL/PLATELET
Abs Immature Granulocytes: 0.02 10*3/uL (ref 0.00–0.07)
Basophils Absolute: 0 10*3/uL (ref 0.0–0.1)
Basophils Relative: 1 %
Eosinophils Absolute: 0.1 10*3/uL (ref 0.0–0.5)
Eosinophils Relative: 2 %
HCT: 34.3 % — ABNORMAL LOW (ref 36.0–46.0)
Hemoglobin: 11.3 g/dL — ABNORMAL LOW (ref 12.0–15.0)
Immature Granulocytes: 1 %
Lymphocytes Relative: 17 %
Lymphs Abs: 0.7 10*3/uL (ref 0.7–4.0)
MCH: 32 pg (ref 26.0–34.0)
MCHC: 32.9 g/dL (ref 30.0–36.0)
MCV: 97.2 fL (ref 80.0–100.0)
Monocytes Absolute: 0.3 10*3/uL (ref 0.1–1.0)
Monocytes Relative: 8 %
Neutro Abs: 2.9 10*3/uL (ref 1.7–7.7)
Neutrophils Relative %: 71 %
Platelets: 180 10*3/uL (ref 150–400)
RBC: 3.53 MIL/uL — ABNORMAL LOW (ref 3.87–5.11)
RDW: 14.6 % (ref 11.5–15.5)
WBC: 4 10*3/uL (ref 4.0–10.5)
nRBC: 0 % (ref 0.0–0.2)

## 2022-12-01 LAB — COMPREHENSIVE METABOLIC PANEL
ALT: 15 U/L (ref 0–44)
AST: 25 U/L (ref 15–41)
Albumin: 3.7 g/dL (ref 3.5–5.0)
Alkaline Phosphatase: 45 U/L (ref 38–126)
Anion gap: 4 — ABNORMAL LOW (ref 5–15)
BUN: 14 mg/dL (ref 8–23)
CO2: 26 mmol/L (ref 22–32)
Calcium: 8.9 mg/dL (ref 8.9–10.3)
Chloride: 107 mmol/L (ref 98–111)
Creatinine, Ser: 1.01 mg/dL — ABNORMAL HIGH (ref 0.44–1.00)
GFR, Estimated: 59 mL/min — ABNORMAL LOW (ref >60)
Glucose, Bld: 124 mg/dL — ABNORMAL HIGH (ref 70–99)
Potassium: 3.8 mmol/L (ref 3.5–5.1)
Sodium: 137 mmol/L (ref 135–145)
Total Bilirubin: 1 mg/dL (ref 0.3–1.2)
Total Protein: 6.5 g/dL (ref 6.5–8.1)

## 2022-12-01 MED ORDER — ABEMACICLIB 100 MG PO TABS
ORAL_TABLET | ORAL | 3 refills | Status: DC
  Filled 2022-12-01: qty 56, fill #0

## 2022-12-01 MED ORDER — ABEMACICLIB 100 MG PO TABS: ORAL_TABLET | ORAL | 3 refills | Status: DC

## 2022-12-01 MED ORDER — SODIUM CHLORIDE 0.9% FLUSH
10.0000 mL | Freq: Once | INTRAVENOUS | Status: AC
  Administered 2022-12-01: 10 mL via INTRAVENOUS
  Filled 2022-12-01: qty 10

## 2022-12-01 MED ORDER — HEPARIN SOD (PORK) LOCK FLUSH 100 UNIT/ML IV SOLN
500.0000 [IU] | Freq: Once | INTRAVENOUS | Status: AC
  Administered 2022-12-01: 500 [IU] via INTRAVENOUS
  Filled 2022-12-01: qty 5

## 2022-12-01 MED ORDER — GABAPENTIN 300 MG PO CAPS: ORAL_CAPSULE | ORAL | 0 refills | Status: DC

## 2022-12-01 NOTE — Telephone Encounter (Signed)
Request for Liquid bx (xF) testing was submitted via [facility] portal. Blood was collected today and will be sent via [employer].   Tracking # [id number]  Financial assistance application submitted and was approved. Pt out-of-pocket cost will be $0.

## 2022-12-01 NOTE — Assessment & Plan Note (Addendum)
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 CT scan  was reviewed and discussed with patient. Stable disease.  Continue Abemaciclib 100 mg twice daily plus letrozole 2.'5mg'$  daily. Biopsy specimen -decalcified , not appropriate for [facility] NGS, check liquid biopsy

## 2022-12-01 NOTE — Assessment & Plan Note (Signed)
Intermittent A-fib, now on Eliquis 5 mg twice daily. Abemaciclib is associated with <5% risk of A-fib. Patient has increased risk of bleeding.  Close monitor.   Continue follow-up with cardiology.

## 2022-12-01 NOTE — Assessment & Plan Note (Signed)
#  Grade 2 neuropathy, continue gabapentin,  Continue gabapentin 600 mg in a.m., 900 mg at bedtime.

## 2022-12-01 NOTE — Progress Notes (Signed)
Patient here for follow up. Pt reports ocasional discomfort to right lower back/ hip area.

## 2022-12-01 NOTE — Assessment & Plan Note (Addendum)
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 Zometa Q 8 weeks.  Continue calcium and vitamin D supplementation. Amber Blevins

## 2022-12-01 NOTE — Progress Notes (Signed)
Hematology/Oncology Progress note Telephone:([phone] Fax:([phone]) [phone]     CHIEF COMPLAINTS/REASON FOR VISIT:  metastatic breast caner ASSESSMENT & PLAN:   Cancer Staging  Primary malignant neoplasm of breast with metastasis ([place]) Staging form: Breast, AJCC 8th Edition - Clinical stage from 07/12/2021: Stage IV (rcTX, cNX, pM1, ER+, PR+, HER2-) - Signed by [name], MD on 03/08/2022   Primary malignant neoplasm of breast with metastasis ([place]) Metastatic Breast cancer with extensive thoracic and bone involvement, in visceral crisis, ER+, PR+ HER2 neg Labs reviewed and discussed with patient. CT scan  was reviewed and discussed with patient. Stable disease.  Continue Abemaciclib 100 mg twice daily plus letrozole 2.'5mg'$  daily. Biopsy specimen -decalcified , not appropriate for [facility] N[facility], check liquid biopsy  A-fib (HCC) Intermittent A-fib, now on Eliquis 5 mg twice daily. Abemaciclib is associated with <5% risk of A-fib. Patient has increased risk of bleeding.  Close monitor.   Continue follow-up with cardiology.  Encounter for antineoplastic chemotherapy Chemotherapy plan as listed above   Bilateral pleural effusion moderate on the right side and small on the left side.  Unchanged on recent CT scan.   S/p pallaitve ultrasound-guided right thoracentesis removed 859m Stable effusion size  Chemotherapy-induced neuropathy (HCC) #Grade 2 neuropathy, continue gabapentin,  Continue gabapentin 600 mg in a.m., 900 mg at bedtime.   Metastasis to bone ([facility] 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 Zometa Q 8 weeks.  Continue calcium and vitamin D supplementation. .     Orders Placed This Encounter  Procedures   Cancer antigen 15-3    Standing Status:   Future    Standing Expiration Date:   12/01/2023   Cancer antigen 27.29    Standing Status:   Future    Standing Expiration Date:    12/01/2023   CBC with Differential ([place])    Standing Status:   Future    Standing Expiration Date:   12/01/2023   CMP ([place])    Standing Status:   Future    Standing Expiration Date:   12/01/2023   Follow up 4 weeks All questions were answered. The patient knows to call the clinic with any problems, questions or concerns.  ZEarlie Server MD, PhD [facility] Oncology 12/01/2022      HISTORY OF PRESENTING ILLNESS:   Amber ROZEKis a  72y.o.  female with PMH listed below presents for follow up for treatment of metastatic breast cancer.   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 Additional metastatic involvement T12, L1 through L3.  No primary tumor site identified. Top differential considerations are Metastatic Disease Unknown primary, less likely Lymphoma or Multiple Myeloma.   2. Superimposed lumbar spine degeneration, including degenerative moderate to severe left L3 and L4 nerve level impingement from disc herniation.      07/02/2021 Imaging  CT chest abdomen pelvis showed innumerable small pulmonary and pleural nodules consistent with diffuse metastasis.  Associated with probable malignant pleural effusion.  Mediastinal and hilar lymphadenopathy consistent with metastatic disease.  Hepatic and bilateral adrenal gland metastasis.  Diffuse  extensive destructive metastatic bone disease involving pelvis.   07/12/2021 Cancer Staging   Staging form: Breast, AJCC 8th Edition - Clinical stage from 07/12/2021: Stage IV (rcTX, cNX, pM1, ER+, PR+, HER2-) - Signed by [name], MD on 03/08/2022 Stage prefix: Recurrence   07/17/2021 Initial Diagnosis   Metastatic breast cancer -history of breast cancer, diagnosed in 2010 Left-sided T2 (2.4cm) N0, ER/PR positive, HER2 negative IDC of the breast, s/p lumpectomy by Dr[facility]Meyer [facility]. Oncotype score of 11 who completed radiation and she finished 10 years of Femara from 05/2009 and stopped in 2020.  -07/12/2021 patient underwent right thoracentesis.  Cytology was positive for metastatic carcinoma, compatible with breast origin.  ER/PR +, HER2 negative -07/16/2021, patient underwent iliac bone biopsy.  Positive for metastatic carcinoma.   07/21/2021 - 09/16/2021 Chemotherapy   Patient is on Treatment Plan : BREAST Paclitaxel D1,8,15 q28d     09/04/2021 Imaging   CT chest abdomen pelvis showed improvement of the pulmonary nodularity and thickening in the left and the right lung.  Persistent nodular interstitial and pleural thickening remains.  Improvement of mediastinal and hilar adenopathy, hepatic metastatic lesions are similar.  Adrenal gland metastasis is similar.  Interval increase in the sclerotic bone metastasis.    09/30/2021 -  Chemotherapy   started on abemaciclib 100 mg twice daily and letrozole.   02/24/2022 Imaging    CT chest abdomen pelvis with contrast showed unchanged bilateral pleural effusion with associated intralobular septal thickening of the lung bases.  Without discrete nodularity consistent with stable appearance of treated pleural and lymphangitic metastatic disease.  Stable or minimally diminished subcentimeter liver lesions.  Unchanged left adrenal metastasis.  Unchanged widespread sclerotic osseous metastatic disease involving the included axial and appendicular skeleton.   No evidence of new metastatic disease in the chest abdomen or pelvis.  Coronary artery disease.   03/06/2022 Procedure   Ultrasound guided thoracentesis, removal of 894m pleural fluid.    05/29/2022 Imaging   CT chest abdomen pelvis 1. Stable CT of the chest, abdomen and pelvis. 2. Unchanged appearance of bilateral pleural effusions, pleural nodularity, and interlobular septal thickening compatible with pleural and lymphangitic metastatic disease. 3. Stable appearance of multifocal sclerotic bone metastases. 4. Multifocal low-attenuation liver lesions are unchanged in the interval. 5. Stable appearance of left adrenal gland metastases. 6. Stable appearance of small pericardial effusion.7. No new or progressive disease identified.8. Aortic Atherosclerosis (ICD10-I70.0).   06/06/2022 Imaging   Bone scan Constellation of findings are consistent with multifocal osseous metastatic disease throughout the axial and appendicular skeleton.Of note, osseous metastatic disease involves the RIGHT greater than LEFT femur. Consider evaluation with dedicated hip and femur radiographs to evaluate for overall disease burden and potential risk for pathologic fracture.   08/31/2022 Imaging   CT chest abdomen pelvis w contrast 1. Unchanged small right, trace left pleural effusions with associated diffuse interlobular septal thickening and fine fissural and perilymphatic nodularity throughout the lungs, findings consistent with lymphangitic metastatic disease. 2. Stable or slightly diminished very subtle hypodense lesion of the central right lobe of the liver, hepatic segment VIII measuring 0.6 cm, previously 0.8 cm. Additional unchanged, very subtle hypodense lesion of hepatic segment IV B measuring 1.1 cm. 3. Unchanged widespread sclerotic osseous metastatic disease throughout the included axial  and proximal appendicular skeleton, particularly dense in the lower lumbar spine and sacrum. 4. Unchanged left adrenal  nodule measuring 1.7 x 1.1 cm. 5. Overall constellation of findings is consistent with stable metastatic disease. 6. Coronary artery disease.     Patient follows up with cardiology for evaluation of bradycardia.  Cardiology notes reviewed.  72-hour Holter monitor revealed revealed predominant sinus rhythm with mean heart rate of 96 bpm, heart rate range 53 to 129 bpm, frequent premature ventricular contractions (27% burden), and intermittent atrial fibrillation (5% burden) with longest episode 3 hours. CHA2DS2-VASc score of 3, per cardiology recommendation patient was started on Eliquis 5 mg twice daily.     INTERVAL HISTORY Amber Blevins is a 72 y.o. female who has above history reviewed by me today presents for follow up visit for metastatic breast cancer Occasional back and hip pain.. Feeling well.  Denies any headache, nausea vomiting diarrhea.  Fever or chills.  Review of Systems  Constitutional:  Negative for appetite change, chills, fatigue and fever.  HENT:   Negative for hearing loss and voice change.   Eyes:  Negative for eye problems.  Respiratory:  Negative for chest tightness, cough and shortness of breath.   Cardiovascular:  Negative for chest pain.  Gastrointestinal:  Negative for abdominal distention, abdominal pain and blood in stool.  Endocrine: Negative for hot flashes.  Genitourinary:  Negative for difficulty urinating and frequency.   Musculoskeletal:  Negative for arthralgias.  Skin:  Negative for itching and rash.  Neurological:  Positive for numbness. Negative for extremity weakness.  Hematological:  Negative for adenopathy.  Psychiatric/Behavioral:  Negative for confusion.      MEDICAL HISTORY:  Past Medical History:  Diagnosis Date   A-fib ([place])    Breast cancer (HCC)    COPD (chronic obstructive pulmonary disease) ([place])    Family history of adverse reaction to anesthesia    brother has problem with coming out of anesthesia   High cholesterol     Hypertension    Personal history of radiation therapy    Pre-diabetes     SURGICAL HISTORY: Past Surgical History:  Procedure Laterality Date   BREAST LUMPECTOMY Left    2010   COLONOSCOPY     EYE SURGERY Left    Cataract surgery   PORTACATH PLACEMENT N/A 08/01/2021   Procedure: INSERTION PORT-A-CATH;  Surgeon: Herbert Pun, MD;  Location: [facility] ORS;  Service: General;  Laterality: N/A;   thorocentesis Right 07/10/2021   and another on 07/25/21    SOCIAL HISTORY: Social History   Socioeconomic History   Marital status: Married    Spouse name: Not on file   Number of children: Not on file   Years of education: Not on file   Highest education level: Not on file  Occupational History   Not on file  Tobacco Use   Smoking status: Former    Packs/day: 1.00    Years: 25.00    Total pack years: 25.00    Types: Cigarettes    Quit date: 31    Years since quitting: 34.1   Smokeless tobacco: Never  Vaping Use   Vaping Use: Never used  Substance and Sexual Activity   Alcohol use: Not Currently   Drug use: Never   Sexual activity: Not on file  Other Topics Concern   Not on file  Social History Narrative   Not on file   Social Determinants of Health   Financial Resource Strain: Not on file  Food Insecurity:  Not on file  Transportation Needs: Not on file  Physical Activity: Not on file  Stress: Not on file  Social Connections: Not on file  Intimate Partner Violence: Not on file    FAMILY HISTORY: Family History  Problem Relation Age of Onset   Cancer Mother        gynecological   Lung cancer Mother    Diabetes Father    Heart disease Father    Parkinson's disease Father    Brain cancer Brother    Bladder Cancer Brother    Pulmonary disease Brother    Rheumatic fever Brother    Breast cancer Neg Hx     ALLERGIES:  is allergic to green tea (camellia sinensis), melaleuca viridiflora, tea tree oil, and lisinopril.  MEDICATIONS:  Current  Outpatient Medications  Medication Sig Dispense Refill   acetaminophen (TYLENOL) 650 MG CR tablet Take 650 mg by mouth every 8 (eight) hours as needed for pain.     atorvastatin (LIPITOR) 40 MG tablet Take 40 mg by mouth at bedtime.     Boswellia-Glucosamine-Vit D (OSTEO BI-FLEX-GLUCOS/5-LOXIN) TABS Take 1 capsule by mouth in the morning and at bedtime.     Calcium Carb-Cholecalciferol 600-400 MG-UNIT TABS Take 1 tablet by mouth daily.     cyanocobalamin 1000 MCG tablet Take 1,000 mcg by mouth daily.     Docusate Sodium (DSS) 100 MG CAPS Take 1 capsule by mouth daily.     ELIQUIS 5 MG TABS tablet Take 5 mg by mouth 2 (two) times daily.     letrozole (FEMARA) 2.5 MG tablet Take 1 tablet (2.5 mg total) by mouth daily. 90 tablet 2   lidocaine-prilocaine (EMLA) cream Apply 1 application. topically as needed. 30 g 5   LORazepam (ATIVAN) 0.5 MG tablet TAKE 1 TABLET BY MOUTH AT BEDTIME AS NEEDED FOR ANXIETY 30 tablet 0   losartan (COZAAR) 50 MG tablet Take 50 mg by mouth daily.     magnesium chloride (SLOW-MAG) 64 MG TBEC SR tablet Take 1 tablet by mouth at bedtime.     melatonin (MELATONIN MAXIMUM STRENGTH) 5 MG TABS Take 1 tablet by mouth at bedtime.     Turmeric (QC TUMERIC COMPLEX PO) Take 1 capsule by mouth at bedtime.     abemaciclib (VERZENIO) 100 MG tablet Take 1 tablet (100 mg total) by mouth 2 (two) times daily. Swallow tablets whole. Do not chew, crush, or split tablets before swallowing. 56 tablet 3   diphenoxylate-atropine (LOMOTIL) 2.5-0.025 MG tablet Take 1 tablet by mouth 4 (four) times daily as needed for diarrhea or loose stools. (Patient not taking: Reported on 12/01/2022) 30 tablet 0   gabapentin (NEURONTIN) 300 MG capsule TAKE 2 CAPSULES BY MOUTH ONCE DAILY IN THE MORNING AND 3 ONCE DAILY IN THE EVENING 150 capsule 0   Loperamide HCl (IMODIUM PO) Take by mouth. (Patient not taking: Reported on 12/01/2022)     ondansetron (ZOFRAN) 8 MG tablet Take by mouth every 8 (eight) hours as needed  for nausea or vomiting. (Patient not taking: Reported on 11/02/2022)     prochlorperazine (COMPAZINE) 5 MG tablet Take 5 mg by mouth every 6 (six) hours as needed for nausea or vomiting. (Patient not taking: Reported on 12/01/2022)     No current facility-administered medications for this visit.     PHYSICAL EXAMINATION: ECOG PERFORMANCE STATUS: 1 - Symptomatic but completely ambulatory Vitals:   12/01/22 1016  BP: 134/67  Pulse: (!) 47  Resp: 18  Temp: 98 F (36.7  C)   Filed Weights   12/01/22 1016  Weight: 184 lb 9.6 oz (83.7 kg)    Physical Exam Constitutional:      General: She is not in acute distress.    Appearance: She is not diaphoretic.  HENT:     Head: Normocephalic and atraumatic.     Nose: Nose normal.     Mouth/Throat:     Pharynx: No oropharyngeal exudate.  Eyes:     General: No scleral icterus.    Pupils: Pupils are equal, round, and reactive to light.  Cardiovascular:     Rate and Rhythm: Normal rate and regular rhythm.     Heart sounds: No murmur heard. Pulmonary:     Effort: Pulmonary effort is normal. No respiratory distress.     Comments: Decreased breath sound bilaterally. Abdominal:     General: There is no distension.     Palpations: Abdomen is soft.     Tenderness: There is no abdominal tenderness.  Musculoskeletal:        General: Normal range of motion.     Cervical back: Normal range of motion and neck supple.  Skin:    General: Skin is warm and dry.     Findings: No erythema.  Neurological:     Mental Status: She is alert and oriented to person, place, and time.     Cranial Nerves: No cranial nerve deficit.     Motor: No abnormal muscle tone.     Coordination: Coordination normal.  Psychiatric:        Mood and Affect: Affect normal.       LABORATORY DATA:  I have reviewed the data as listed     Latest Ref Rng & Units 12/01/2022    9:59 AM 11/02/2022    8:58 AM 09/07/2022   12:45 PM  CBC  WBC 4.0 - 10.5 K/uL 4.0  3.7  4.7    Hemoglobin 12.0 - 15.0 g/dL 11.3  12.0  12.3   Hematocrit 36.0 - 46.0 % 34.3  34.8  37.0   Platelets 150 - 400 K/uL 180  183  204       Latest Ref Rng & Units 12/01/2022    9:59 AM 11/02/2022    8:58 AM 09/07/2022   12:45 PM  CMP  Glucose 70 - 99 mg/dL 124  103  89   BUN 8 - 23 mg/dL '14  20  15   '$ Creatinine 0.44 - 1.00 mg/dL 1.01  0.97  1.02   Sodium 135 - 145 mmol/L 137  140  138   Potassium 3.5 - 5.1 mmol/L 3.8  3.9  3.9   Chloride 98 - 111 mmol/L 107  108  103   CO2 22 - 32 mmol/L '26  25  26   '$ Calcium 8.9 - 10.3 mg/dL 8.9  9.1  8.9   Total Protein 6.5 - 8.1 g/dL 6.5  6.6  7.0   Total Bilirubin 0.3 - 1.2 mg/dL 1.0  0.7  0.9   Alkaline Phos 38 - 126 U/L 45  44  44   AST 15 - 41 U/L '25  27  29   '$ ALT 0 - 44 U/L '15  17  16      '$ RADIOGRAPHIC STUDIES: I have personally reviewed the radiological images as listed and agreed with the findings in the report. CT CHEST ABDOMEN PELVIS W CONTRAST  Result Date: 11/26/2022 CLINICAL DATA:  Staging metastatic breast cancer. Previous left lumpectomy and radiation. * Tracking Code:  BO * EXAM: CT CHEST, ABDOMEN, AND PELVIS WITH CONTRAST TECHNIQUE: Multidetector CT imaging of the chest, abdomen and pelvis was performed following the standard protocol during bolus administration of intravenous contrast. RADIATION DOSE REDUCTION: This exam was performed according to the departmental dose-optimization program which includes automated exposure control, adjustment of the mA and/or kV according to patient size and/or use of iterative reconstruction technique. CONTRAST:  175m OMNIPAQUE IOHEXOL 300 MG/ML  SOLN COMPARISON:  CT 08/31/2022 FINDINGS: CT CHEST FINDINGS Cardiovascular: Right upper chest port. Trace pericardial fluid. Heart is nonenlarged. There are some coronary artery calcifications identified, mild. The thoracic aorta has a normal course and caliber with mild atherosclerotic calcified plaque. Mediastinum/Nodes: Preserved thyroid gland. There is some  wall thickening along the mid to lower thoracic esophagus with some mediastinal fluid and stranding. This was seen previously and has a similar distribution. Small hiatal hernia suggested. Surgical clip along the left axillary region. Left breast skin thickening. Mild breast fat stranding. No specific abnormal lymph node enlargement identified including internal mammary chain, chest wall, supraclavicular, axillary, hila or mediastinum. Lungs/Pleura: Persistent small right and tiny left pleural effusion. There are peripheral areas of interstitial septal thickening noted in both lungs, scarring and fibrotic change. Minimal areas of ground-glass are again seen and unchanged. Areas of mild peribronchial thickening. Diffuse calcifications patchy along the tracheobronchial tree. No frank consolidation. No dominant new lung nodule. Musculoskeletal: Mild degenerative changes noted along the spine. 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 Similar areas along spine, pelvis, femurs, sternum, ribs, clavicle and humeri. Similar distribution compared to previous when adjusting for technique. If needed a whole-body bone scan could be considered to further delineate. There is some compression deformities seen at T10 and T7 which are unchanged from previous. CT ABDOMEN PELVIS FINDINGS Hepatobiliary: Fatty liver infiltration. The faint low-density lesion seen in segment 4 measuring 6 mm on the prior stable today on series 2, image 46. Lesion seen more caudal in segment 4B which on the prior measured 11 mm, today measures 12 mm on image 51 of series 2 and is also somewhat faint. No other new clear space-occupying liver lesion. Gallbladder is nondilated. Patent portal vein. Pancreas: Unremarkable. No pancreatic ductal dilatation or surrounding inflammatory changes. Spleen: Normal in size without focal abnormality. Adrenals/Urinary Tract: Bilateral adrenal nodules are seen, left-greater-than-right. The left-sided lesion  was measured 17 x 11 mm previously today 18 by 13 mm. Slightly larger. No enhancing renal mass or collecting system dilatation. Bilateral parapelvic renal cysts. The ureters have normal course and caliber extending down to the bladder. Bladder has a slightly thickened wall but is underdistended. Thickening was seen previously. Please correlate for any symptomatology. Stomach/Bowel: There is a large amount of colonic stool. Scattered left-sided colonic diverticula. No bowel obstruction, free air or free fluid. Normal retrocecal appendix in the right hemipelvis. The stomach is mildly distended with fluid. Small bowel is nondilated. Vascular/Lymphatic: Diffuse vascular calcifications. Normal caliber aorta and IVC. No specific abnormal lymph node enlargement seen in the abdomen and pelvis. Reproductive: Uterus and bilateral adnexa are unremarkable. Other: Slight anasarca. Minimal presacral fat stranding, nonspecific. There is also some diffuse hazy mesenteric stranding with some small nodes. Not significantly changed. Musculoskeletal: Once again sclerotic bone metastases identified in particular of the pelvis but diffusely involving the osseous structures included in the imaging field. IMPRESSION: Relatively stable extensive osseous metastatic disease. Stable small poorly defined liver lesions. Small adrenal nodules, left-greater-than-right. Left nodule is slightly larger than prior. No new mass lesion, fluid collection  or lymph node enlargement. Stable small pleural effusions, right-greater-than-left. There is wall thickening of the mid to lower esophagus with some mediastinal stranding and fluid. Stable. Colonic diverticula.  Nonspecific hazy mesentery. Stable mild wall thickening of the urinary bladder. Electronically Signed   By: Jill Side M.D.   On: 11/26/2022 19:16

## 2022-12-01 NOTE — Assessment & Plan Note (Addendum)
moderate on the right side and small on the left side.  Unchanged on recent CT scan.   S/p pallaitve ultrasound-guided right thoracentesis removed 826m Stable effusion size

## 2022-12-01 NOTE — Assessment & Plan Note (Signed)
Chemotherapy plan as listed above.  

## 2022-12-02 LAB — CANCER ANTIGEN 27.29: CA 27.29: 44.9 U/mL — ABNORMAL HIGH (ref 0.0–38.6)

## 2022-12-03 ENCOUNTER — Encounter: Payer: Self-pay | Admitting: Oncology

## 2022-12-03 LAB — CANCER ANTIGEN 15-3: CA 15-3: 30.7 U/mL — ABNORMAL HIGH (ref 0.0–25.0)

## 2022-12-10 ENCOUNTER — Encounter: Payer: Self-pay | Admitting: Oncology

## 2022-12-10 NOTE — Telephone Encounter (Signed)
xF report sent to scan in media

## 2022-12-21 ENCOUNTER — Telehealth: Payer: Self-pay | Admitting: *Deleted

## 2022-12-21 NOTE — Telephone Encounter (Signed)
Contacted AJ @ [employer] and he states that since pt was approved for full coverage assistance ($0 out of pocket) no action will be required.

## 2022-12-21 NOTE — Telephone Encounter (Signed)
[employer] XT testing has been denied and they are offering Dr Tasia Catchings to do a peer to peer for possible approval but has to call before 1 PM tomorrow to set up perr to peer by calling [phone]

## 2022-12-22 ENCOUNTER — Encounter: Payer: Self-pay | Admitting: Oncology

## 2022-12-22 NOTE — Telephone Encounter (Signed)
xT reports scanned in Media.

## 2022-12-22 NOTE — Telephone Encounter (Signed)
Pt would like [employer] results

## 2022-12-29 ENCOUNTER — Ambulatory Visit
Admission: RE | Admit: 2022-12-29 | Discharge: 2022-12-29 | Disposition: A | Discharge status: Home / Self Care | Payer: Medicare PPO | Attending: Oncology | Admitting: Oncology

## 2022-12-29 ENCOUNTER — Ambulatory Visit
Admission: RE | Admit: 2022-12-29 | Discharge: 2022-12-29 | Disposition: A | Discharge status: Home / Self Care | Payer: Medicare PPO | Source: Ambulatory Visit | Attending: Oncology | Admitting: Oncology

## 2022-12-29 ENCOUNTER — Telehealth: Payer: Self-pay | Admitting: Oncology

## 2022-12-29 ENCOUNTER — Inpatient Hospital Stay: Payer: Medicare PPO | Attending: Oncology

## 2022-12-29 ENCOUNTER — Encounter: Payer: Self-pay | Admitting: Oncology

## 2022-12-29 ENCOUNTER — Inpatient Hospital Stay: Payer: Medicare PPO

## 2022-12-29 ENCOUNTER — Inpatient Hospital Stay: Payer: Medicare PPO | Admitting: Oncology

## 2022-12-29 VITALS — BP 122/58 | HR 92 | Temp 97.5°F | Wt 188.6 lb

## 2022-12-29 DIAGNOSIS — R0602 Shortness of breath: Secondary | ICD-10-CM | POA: Diagnosis present

## 2022-12-29 DIAGNOSIS — C7951 Secondary malignant neoplasm of bone: Secondary | ICD-10-CM

## 2022-12-29 DIAGNOSIS — C50919 Malignant neoplasm of unspecified site of unspecified female breast: Secondary | ICD-10-CM

## 2022-12-29 DIAGNOSIS — G620 Drug-induced polyneuropathy: Secondary | ICD-10-CM | POA: Diagnosis not present

## 2022-12-29 DIAGNOSIS — Z79811 Long term (current) use of aromatase inhibitors: Secondary | ICD-10-CM | POA: Diagnosis not present

## 2022-12-29 DIAGNOSIS — Z95828 Presence of other vascular implants and grafts: Secondary | ICD-10-CM

## 2022-12-29 DIAGNOSIS — I4891 Unspecified atrial fibrillation: Secondary | ICD-10-CM | POA: Diagnosis not present

## 2022-12-29 DIAGNOSIS — T451X5A Adverse effect of antineoplastic and immunosuppressive drugs, initial encounter: Secondary | ICD-10-CM

## 2022-12-29 DIAGNOSIS — C787 Secondary malignant neoplasm of liver and intrahepatic bile duct: Secondary | ICD-10-CM | POA: Diagnosis not present

## 2022-12-29 DIAGNOSIS — J90 Pleural effusion, not elsewhere classified: Secondary | ICD-10-CM | POA: Diagnosis not present

## 2022-12-29 DIAGNOSIS — Z87891 Personal history of nicotine dependence: Secondary | ICD-10-CM | POA: Diagnosis not present

## 2022-12-29 DIAGNOSIS — Z5111 Encounter for antineoplastic chemotherapy: Secondary | ICD-10-CM | POA: Diagnosis not present

## 2022-12-29 DIAGNOSIS — C7971 Secondary malignant neoplasm of right adrenal gland: Secondary | ICD-10-CM | POA: Insufficient documentation

## 2022-12-29 DIAGNOSIS — C7972 Secondary malignant neoplasm of left adrenal gland: Secondary | ICD-10-CM | POA: Insufficient documentation

## 2022-12-29 DIAGNOSIS — Z170 Estrogen receptor positive status [ER+]: Secondary | ICD-10-CM | POA: Diagnosis not present

## 2022-12-29 DIAGNOSIS — C50912 Malignant neoplasm of unspecified site of left female breast: Secondary | ICD-10-CM | POA: Diagnosis not present

## 2022-12-29 DIAGNOSIS — T451X5D Adverse effect of antineoplastic and immunosuppressive drugs, subsequent encounter: Secondary | ICD-10-CM

## 2022-12-29 LAB — CMP (CANCER CENTER ONLY)
ALT: 17 U/L (ref 0–44)
AST: 30 U/L (ref 15–41)
Albumin: 3.7 g/dL (ref 3.5–5.0)
Alkaline Phosphatase: 54 U/L (ref 38–126)
Anion gap: 5 (ref 5–15)
BUN: 19 mg/dL (ref 8–23)
CO2: 25 mmol/L (ref 22–32)
Calcium: 8.9 mg/dL (ref 8.9–10.3)
Chloride: 106 mmol/L (ref 98–111)
Creatinine: 1.02 mg/dL — ABNORMAL HIGH (ref 0.44–1.00)
GFR, Estimated: 58 mL/min — ABNORMAL LOW (ref >60)
Glucose, Bld: 113 mg/dL — ABNORMAL HIGH (ref 70–99)
Potassium: 3.7 mmol/L (ref 3.5–5.1)
Sodium: 136 mmol/L (ref 135–145)
Total Bilirubin: 0.8 mg/dL (ref 0.3–1.2)
Total Protein: 6.5 g/dL (ref 6.5–8.1)

## 2022-12-29 LAB — CBC WITH DIFFERENTIAL (CANCER CENTER ONLY)
Abs Immature Granulocytes: 0.03 10*3/uL (ref 0.00–0.07)
Basophils Absolute: 0 10*3/uL (ref 0.0–0.1)
Basophils Relative: 1 %
Eosinophils Absolute: 0.1 10*3/uL (ref 0.0–0.5)
Eosinophils Relative: 2 %
HCT: 33.7 % — ABNORMAL LOW (ref 36.0–46.0)
Hemoglobin: 11 g/dL — ABNORMAL LOW (ref 12.0–15.0)
Immature Granulocytes: 1 %
Lymphocytes Relative: 18 %
Lymphs Abs: 0.8 10*3/uL (ref 0.7–4.0)
MCH: 32.1 pg (ref 26.0–34.0)
MCHC: 32.6 g/dL (ref 30.0–36.0)
MCV: 98.3 fL (ref 80.0–100.0)
Monocytes Absolute: 0.3 10*3/uL (ref 0.1–1.0)
Monocytes Relative: 8 %
Neutro Abs: 3 10*3/uL (ref 1.7–7.7)
Neutrophils Relative %: 70 %
Platelet Count: 197 10*3/uL (ref 150–400)
RBC: 3.43 MIL/uL — ABNORMAL LOW (ref 3.87–5.11)
RDW: 14.6 % (ref 11.5–15.5)
WBC Count: 4.2 10*3/uL (ref 4.0–10.5)
nRBC: 0 % (ref 0.0–0.2)

## 2022-12-29 MED ORDER — HEPARIN SOD (PORK) LOCK FLUSH 100 UNIT/ML IV SOLN
500.0000 [IU] | Freq: Once | INTRAVENOUS | Status: AC
  Administered 2022-12-29: 500 [IU] via INTRAVENOUS
  Filled 2022-12-29: qty 5

## 2022-12-29 MED ORDER — ZOLEDRONIC ACID 4 MG/100ML IV SOLN
4.0000 mg | Freq: Once | INTRAVENOUS | Status: AC
  Administered 2022-12-29: 4 mg via INTRAVENOUS
  Filled 2022-12-29: qty 100

## 2022-12-29 MED ORDER — SODIUM CHLORIDE 0.9% FLUSH
10.0000 mL | INTRAVENOUS | Status: DC | PRN
  Administered 2022-12-29: 10 mL via INTRAVENOUS
  Filled 2022-12-29: qty 10

## 2022-12-29 MED ORDER — SODIUM CHLORIDE 0.9 % IV SOLN
INTRAVENOUS | Status: DC
  Filled 2022-12-29: qty 250

## 2022-12-29 NOTE — Assessment & Plan Note (Signed)
Intermittent A-fib, now on Eliquis 5 mg twice daily. Abemaciclib is associated with <5% risk of A-fib. Patient has increased risk of bleeding.  Close monitor.   Continue follow-up with cardiology. 

## 2022-12-29 NOTE — Assessment & Plan Note (Addendum)
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 CT scan  was reviewed and discussed with patient. Stable disease.  Continue Abemaciclib 100 mg twice daily plus letrozole 2.5mg  daily. 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   Repeat MRI brain CT chest abdomen plevis prior to next visit in 2 months.

## 2022-12-29 NOTE — Telephone Encounter (Signed)
PER Oliver CW, MRI ASAP requires Auth

## 2022-12-29 NOTE — Progress Notes (Signed)
Hematology/Oncology Progress note [phone] Fax:([phone]     CHIEF COMPLAINTS/REASON FOR VISIT:  metastatic breast caner ASSESSMENT & PLAN:   Cancer Staging  Primary malignant neoplasm of breast with metastasis Staging form: Breast, AJCC 8th Edition - Clinical stage from 07/12/2021: Stage IV (rcTX, cNX, pM1, ER+, PR+, HER2-) - Signed by [name], MD on 03/08/2022   Primary malignant neoplasm of breast with metastasis ([place]) Metastatic Breast cancer with extensive thoracic and bone involvement, in visceral crisis, ER+, PR+ HER2 neg Labs reviewed and discussed with patient. CT scan  was reviewed and discussed with patient. Stable disease.  Continue Abemaciclib 100 mg twice daily plus letrozole 2.5mg  daily. 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   Repeat MRI brain CT chest abdomen plevis prior to next visit in 2 months.   Bilateral pleural effusion Repeat CXR   Encounter for antineoplastic chemotherapy Chemotherapy plan as listed above   Chemotherapy-induced neuropathy (HCC) #Grade 2 neuropathy, continue gabapentin,  Continue gabapentin 600 mg in a.m., 900 mg at bedtime.   Metastasis to bone [facility]) 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 Zometa Q 8 weeks.  Continue calcium and vitamin D supplementation. Marland Kitchen    A-fib (HCC) Intermittent A-fib, now on Eliquis 5 mg twice daily. Abemaciclib is associated with <5% risk of A-fib. Patient has increased risk of bleeding.  Close monitor.   Continue follow-up with cardiology.  Port-A-Cath in place Continue port flush every 8 weeks.  Patient prefer for labs to be drawn from port in the future.     No orders of the defined types were placed in this encounter.  Follow up 4 weeks All questions were answered. The patient  knows to call the clinic with any problems, questions or concerns.  [name], MD, PhD [facility] Hematology Oncology 12/29/2022      HISTORY OF PRESENTING ILLNESS:   Amber Blevins is a  72 y.o.  female with PMH listed below presents for follow up for treatment of metastatic breast cancer.   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 Additional metastatic involvement T12, L1 through L3.  No primary tumor site identified. Top differential considerations are Metastatic Disease Unknown primary, less likely Lymphoma or Multiple Myeloma.   2. Superimposed lumbar spine degeneration, including degenerative moderate to severe left L3 and L4 nerve level impingement from disc herniation.      07/02/2021 Imaging   CT chest abdomen pelvis showed innumerable small pulmonary and pleural nodules consistent with diffuse metastasis.  Associated with probable malignant pleural effusion.  Mediastinal and hilar lymphadenopathy consistent with metastatic disease.  Hepatic and bilateral adrenal gland metastasis.  Diffuse extensive destructive metastatic bone disease involving pelvis.   07/12/2021 Cancer Staging   Staging form: Breast, AJCC 8th Edition - Clinical stage from 07/12/2021:  Stage IV (rcTX, cNX, pM1, ER+, PR+, HER2-) - Signed by [name], MD on 03/08/2022 Stage prefix: Recurrence   07/17/2021 Initial  Diagnosis   Metastatic breast cancer -history of breast cancer, diagnosed in 2010 Left-sided T2 (2.4cm) N0, ER/PR positive, HER2 negative IDC of the breast, s/p lumpectomy by Dr.Meyer at [facility]. Oncotype score of 11 who completed radiation and she finished 10 years of Femara from 05/2009 and stopped in 2020.  -07/12/2021 patient underwent right thoracentesis.  Cytology was positive for metastatic carcinoma, compatible with breast origin.  ER/PR +, HER2 negative -07/16/2021, patient underwent iliac bone biopsy.  Positive for metastatic carcinoma.   07/21/2021 - 09/16/2021 Chemotherapy   Patient is on Treatment Plan : BREAST Paclitaxel D1,8,15 q28d     09/04/2021 Imaging   CT chest abdomen pelvis showed improvement of the pulmonary nodularity and thickening in the left and the right lung.  Persistent nodular interstitial and pleural thickening remains.  Improvement of mediastinal and hilar adenopathy, hepatic metastatic lesions are similar.  Adrenal gland metastasis is similar.  Interval increase in the sclerotic bone metastasis.    09/30/2021 -  Chemotherapy   started on abemaciclib 100 mg twice daily and letrozole.   02/24/2022 Imaging    CT chest abdomen pelvis with contrast showed unchanged bilateral pleural effusion with associated intralobular septal thickening of the lung bases.  Without discrete nodularity consistent with stable appearance of treated pleural and lymphangitic metastatic disease.  Stable or minimally diminished subcentimeter liver lesions.  Unchanged left adrenal metastasis.  Unchanged widespread sclerotic osseous metastatic disease involving the included axial and appendicular skeleton.  No evidence of new metastatic disease in the chest abdomen or pelvis.  Coronary artery disease.   03/06/2022 Procedure   Ultrasound guided thoracentesis, removal of 825ml pleural fluid.    05/29/2022 Imaging   CT chest abdomen pelvis 1. Stable CT of the chest, abdomen and pelvis. 2. Unchanged  appearance of bilateral pleural effusions, pleural nodularity, and interlobular septal thickening compatible with pleural and lymphangitic metastatic disease. 3. Stable appearance of multifocal sclerotic bone metastases. 4. Multifocal low-attenuation liver lesions are unchanged in the interval. 5. Stable appearance of left adrenal gland metastases. 6. Stable appearance of small pericardial effusion.7. No new or progressive disease identified.8. Aortic Atherosclerosis (ICD10-I70.0).   06/06/2022 Imaging   Bone scan Constellation of findings are consistent with multifocal osseous metastatic disease throughout the axial and appendicular skeleton.Of note, osseous metastatic disease involves the RIGHT greater than LEFT femur. Consider evaluation with dedicated hip and femur radiographs to evaluate for overall disease burden and potential risk for pathologic fracture.   08/31/2022 Imaging   CT chest abdomen pelvis w contrast 1. Unchanged small right, trace left pleural effusions with associated diffuse interlobular septal thickening and fine fissural and perilymphatic nodularity throughout the lungs, findings consistent with lymphangitic metastatic disease. 2. Stable or slightly diminished very subtle hypodense lesion of the central right lobe of the liver, hepatic segment VIII measuring 0.6 cm, previously 0.8 cm. Additional unchanged, very subtle hypodense lesion of hepatic segment IV B measuring 1.1 cm. 3. Unchanged widespread sclerotic osseous metastatic disease throughout the included axial and proximal appendicular skeleton, particularly dense in the lower lumbar spine and sacrum. 4. Unchanged left adrenal nodule measuring 1.7 x 1.1 cm. 5. Overall constellation of findings is consistent with stable metastatic disease. 6. Coronary artery disease.    12/01/2022 Miscellaneous   [facility] liquid biopsy - PIK3CA mutation. 0.1% Not enough tissue for tissue NGS    Patient follows up with  cardiology for  evaluation of bradycardia.  Cardiology notes reviewed.  72-hour Holter monitor revealed revealed predominant sinus rhythm with mean heart rate of 96 bpm, heart rate range 53 to 129 bpm, frequent premature ventricular contractions (27% burden), and intermittent atrial fibrillation (5% burden) with longest episode 3 hours. CHA2DS2-VASc score of 3, per cardiology recommendation patient was started on Eliquis 5 mg twice daily.     INTERVAL HISTORY Amber Blevins is a 72 y.o. female who has above history reviewed by me today presents for follow up visit for metastatic breast cancer Occasional back and hip pain..  Denies any headache, nausea vomiting diarrhea.  Fever or chills. Experiencing increased shortness of breath; unsure if related to heart condition or medication side effects ("I'm getting winded a little bit more easily"). Neuropathy present in feet and slightly in fingers("It's in my feet. It's not really well, a little bit in my fingers. Not not a lot).  Review of Systems  Constitutional:  Negative for appetite change, chills, fatigue and fever.  HENT:   Negative for hearing loss and voice change.   Eyes:  Negative for eye problems.  Respiratory:  Negative for chest tightness, cough and shortness of breath.   Cardiovascular:  Negative for chest pain.  Gastrointestinal:  Negative for abdominal distention, abdominal pain and blood in stool.  Endocrine: Negative for hot flashes.  Genitourinary:  Negative for difficulty urinating and frequency.   Musculoskeletal:  Negative for arthralgias.  Skin:  Negative for itching and rash.  Neurological:  Positive for numbness. Negative for extremity weakness.  Hematological:  Negative for adenopathy.  Psychiatric/Behavioral:  Negative for confusion.      MEDICAL HISTORY:  Past Medical History:  Diagnosis Date   A-fib    Breast cancer    COPD (chronic obstructive pulmonary disease)    Family history of adverse reaction to anesthesia     brother has problem with coming out of anesthesia   High cholesterol    Hypertension    Personal history of radiation therapy    Pre-diabetes     SURGICAL HISTORY: Past Surgical History:  Procedure Laterality Date   BREAST LUMPECTOMY Left    2010   COLONOSCOPY     EYE SURGERY Left    Cataract surgery   PORTACATH PLACEMENT N/A 08/01/2021   Procedure: INSERTION PORT-A-CATH;  Surgeon: Herbert Pun, MD;  Location: [facility] ORS;  Service: General;  Laterality: N/A;   thorocentesis Right 07/10/2021   and another on 07/25/21    SOCIAL HISTORY: Social History   Socioeconomic History   Marital status: Married    Spouse name: Not on file   Number of children: Not on file   Years of education: Not on file   Highest education level: Not on file  Occupational History   Not on file  Tobacco Use   Smoking status: Former    Packs/day: 1.00    Years: 25.00    Additional pack years: 0.00    Total pack years: 25.00    Types: Cigarettes    Quit date: 45    Years since quitting: 34.2   Smokeless tobacco: Never  Vaping Use   Vaping Use: Never used  Substance and Sexual Activity   Alcohol use: Not Currently   Drug use: Never   Sexual activity: Not on file  Other Topics Concern   Not on file  Social History Narrative   Not on file   Social Determinants of Health   Financial Resource Strain: Not on  file  Food Insecurity: Not on file  Transportation Needs: Not on file  Physical Activity: Not on file  Stress: Not on file  Social Connections: Not on file  Intimate Partner Violence: Not on file    FAMILY HISTORY: Family History  Problem Relation Age of Onset   Cancer Mother        gynecological   Lung cancer Mother    Diabetes Father    Heart disease Father    Parkinson's disease Father    Brain cancer Brother    Bladder Cancer Brother    Pulmonary disease Brother    Rheumatic fever Brother    Breast cancer Neg Hx     ALLERGIES:  is allergic to green tea  (camellia sinensis), melaleuca viridiflora, tea tree oil, and lisinopril.  MEDICATIONS:  Current Outpatient Medications  Medication Sig Dispense Refill   abemaciclib (VERZENIO) 100 MG tablet Take 1 tablet (100 mg total) by mouth 2 (two) times daily. Swallow tablets whole. Do not chew, crush, or split tablets before swallowing. 56 tablet 3   acetaminophen (TYLENOL) 650 MG CR tablet Take 650 mg by mouth every 8 (eight) hours as needed for pain.     atorvastatin (LIPITOR) 40 MG tablet Take 40 mg by mouth at bedtime.     Boswellia-Glucosamine-Vit D (OSTEO BI-FLEX-GLUCOS/5-LOXIN) TABS Take 1 capsule by mouth in the morning and at bedtime.     Calcium Carb-Cholecalciferol 600-400 MG-UNIT TABS Take 1 tablet by mouth daily.     cyanocobalamin 1000 MCG tablet Take 1,000 mcg by mouth daily.     Docusate Sodium (DSS) 100 MG CAPS Take 1 capsule by mouth daily.     ELIQUIS 5 MG TABS tablet Take 5 mg by mouth 2 (two) times daily.     gabapentin (NEURONTIN) 300 MG capsule TAKE 2 CAPSULES BY MOUTH ONCE DAILY IN THE MORNING AND 3 ONCE DAILY IN THE EVENING 150 capsule 0   letrozole (FEMARA) 2.5 MG tablet Take 1 tablet (2.5 mg total) by mouth daily. 90 tablet 2   lidocaine-prilocaine (EMLA) cream Apply 1 application. topically as needed. 30 g 5   LORazepam (ATIVAN) 0.5 MG tablet TAKE 1 TABLET BY MOUTH AT BEDTIME AS NEEDED FOR ANXIETY 30 tablet 0   losartan (COZAAR) 50 MG tablet Take 50 mg by mouth daily.     magnesium chloride (SLOW-MAG) 64 MG TBEC SR tablet Take 1 tablet by mouth at bedtime.     melatonin (MELATONIN MAXIMUM STRENGTH) 5 MG TABS Take 1 tablet by mouth at bedtime.     Turmeric (QC TUMERIC COMPLEX PO) Take 1 capsule by mouth at bedtime.     diphenoxylate-atropine (LOMOTIL) 2.5-0.025 MG tablet Take 1 tablet by mouth 4 (four) times daily as needed for diarrhea or loose stools. (Patient not taking: Reported on 12/01/2022) 30 tablet 0   Loperamide HCl (IMODIUM PO) Take by mouth. (Patient not taking:  Reported on 12/01/2022)     ondansetron (ZOFRAN) 8 MG tablet Take by mouth every 8 (eight) hours as needed for nausea or vomiting. (Patient not taking: Reported on 11/02/2022)     prochlorperazine (COMPAZINE) 5 MG tablet Take 5 mg by mouth every 6 (six) hours as needed for nausea or vomiting. (Patient not taking: Reported on 12/01/2022)     No current facility-administered medications for this visit.     PHYSICAL EXAMINATION: ECOG PERFORMANCE STATUS: 1 - Symptomatic but completely ambulatory Vitals:   12/29/22 0935  BP: (!) 122/58  Pulse: 92  Temp: (!) 97.5  F (36.4 C)  SpO2: 100%   Filed Weights   12/29/22 0935  Weight: 188 lb 9.6 oz (85.5 kg)    Physical Exam Constitutional:      General: She is not in acute distress.    Appearance: She is not diaphoretic.  HENT:     Head: Normocephalic and atraumatic.     Nose: Nose normal.     Mouth/Throat:     Pharynx: No oropharyngeal exudate.  Eyes:     General: No scleral icterus.    Pupils: Pupils are equal, round, and reactive to light.  Cardiovascular:     Rate and Rhythm: Normal rate and regular rhythm.     Heart sounds: No murmur heard. Pulmonary:     Effort: Pulmonary effort is normal. No respiratory distress.     Comments: Decreased breath sound bilaterally. Abdominal:     General: There is no distension.     Palpations: Abdomen is soft.     Tenderness: There is no abdominal tenderness.  Musculoskeletal:        General: Normal range of motion.     Cervical back: Normal range of motion and neck supple.  Skin:    General: Skin is warm and dry.     Findings: No erythema.  Neurological:     Mental Status: She is alert and oriented to person, place, and time.     Cranial Nerves: No cranial nerve deficit.     Motor: No abnormal muscle tone.     Coordination: Coordination normal.  Psychiatric:        Mood and Affect: Affect normal.       LABORATORY DATA:  I have reviewed the data as listed     Latest Ref Rng &  Units 12/29/2022    8:55 AM 12/01/2022    9:59 AM 11/02/2022    8:58 AM  CBC  WBC 4.0 - 10.5 K/uL 4.2  4.0  3.7   Hemoglobin 12.0 - 15.0 g/dL 11.0  11.3  12.0   Hematocrit 36.0 - 46.0 % 33.7  34.3  34.8   Platelets 150 - 400 K/uL 197  180  183       Latest Ref Rng & Units 12/29/2022    8:55 AM 12/01/2022    9:59 AM 11/02/2022    8:58 AM  CMP  Glucose 70 - 99 mg/dL 113  124  103   BUN 8 - 23 mg/dL 19  14  20    Creatinine 0.44 - 1.00 mg/dL 1.02  1.01  0.97   Sodium 135 - 145 mmol/L 136  137  140   Potassium 3.5 - 5.1 mmol/L 3.7  3.8  3.9   Chloride 98 - 111 mmol/L 106  107  108   CO2 22 - 32 mmol/L 25  26  25    Calcium 8.9 - 10.3 mg/dL 8.9  8.9  9.1   Total Protein 6.5 - 8.1 g/dL 6.5  6.5  6.6   Total Bilirubin 0.3 - 1.2 mg/dL 0.8  1.0  0.7   Alkaline Phos 38 - 126 U/L 54  45  44   AST 15 - 41 U/L 30  25  27    ALT 0 - 44 U/L 17  15  17       RADIOGRAPHIC STUDIES: I have personally reviewed the radiological images as listed and agreed with the findings in the report. CT CHEST ABDOMEN PELVIS W CONTRAST  Result Date: 11/26/2022 CLINICAL DATA:  Staging metastatic breast cancer. Previous  left lumpectomy and radiation. * Tracking Code: BO * EXAM: CT CHEST, ABDOMEN, AND PELVIS WITH CONTRAST TECHNIQUE: Multidetector CT imaging of the chest, abdomen and pelvis was performed following the standard protocol during bolus administration of intravenous contrast. RADIATION DOSE REDUCTION: This exam was performed according to the departmental dose-optimization program which includes automated exposure control, adjustment of the mA and/or kV according to patient size and/or use of iterative reconstruction technique. CONTRAST:  150mL OMNIPAQUE IOHEXOL 300 MG/ML  SOLN COMPARISON:  CT 08/31/2022 FINDINGS: CT CHEST FINDINGS Cardiovascular: Right upper chest port. Trace pericardial fluid. Heart is nonenlarged. There are some coronary artery calcifications identified, mild. The thoracic aorta has a normal course and  caliber with mild atherosclerotic calcified plaque. Mediastinum/Nodes: Preserved thyroid gland. There is some wall thickening along the mid to lower thoracic esophagus with some mediastinal fluid and stranding. This was seen previously and has a similar distribution. Small hiatal hernia suggested. Surgical clip along the left axillary region. Left breast skin thickening. Mild breast fat stranding. No specific abnormal lymph node enlargement identified including internal mammary chain, chest wall, supraclavicular, axillary, hila or mediastinum. Lungs/Pleura: Persistent small right and tiny left pleural effusion. There are peripheral areas of interstitial septal thickening noted in both lungs, scarring and fibrotic change. Minimal areas of ground-glass are again seen and unchanged. Areas of mild peribronchial thickening. Diffuse calcifications patchy along the tracheobronchial tree. No frank consolidation. No dominant new lung nodule. Musculoskeletal: Mild degenerative changes noted along the spine. 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 Similar areas along spine, pelvis, femurs, sternum, ribs, clavicle and humeri. Similar distribution compared to previous when adjusting for technique. If needed a whole-body bone scan could be considered to further delineate. There is some compression deformities seen at T10 and T7 which are unchanged from previous. CT ABDOMEN PELVIS FINDINGS Hepatobiliary: Fatty liver infiltration. The faint low-density lesion seen in segment 4 measuring 6 mm on the prior stable today on series 2, image 46. Lesion seen more caudal in segment 4B which on the prior measured 11 mm, today measures 12 mm on image 51 of series 2 and is also somewhat faint. No other new clear space-occupying liver lesion. Gallbladder is nondilated. Patent portal vein. Pancreas: Unremarkable. No pancreatic ductal dilatation or surrounding inflammatory changes. Spleen: Normal in size without focal abnormality.  Adrenals/Urinary Tract: Bilateral adrenal nodules are seen, left-greater-than-right. The left-sided lesion was measured 17 x 11 mm previously today 18 by 13 mm. Slightly larger. No enhancing renal mass or collecting system dilatation. Bilateral parapelvic renal cysts. The ureters have normal course and caliber extending down to the bladder. Bladder has a slightly thickened wall but is underdistended. Thickening was seen previously. Please correlate for any symptomatology. Stomach/Bowel: There is a large amount of colonic stool. Scattered left-sided colonic diverticula. No bowel obstruction, free air or free fluid. Normal retrocecal appendix in the right hemipelvis. The stomach is mildly distended with fluid. Small bowel is nondilated. Vascular/Lymphatic: Diffuse vascular calcifications. Normal caliber aorta and IVC. No specific abnormal lymph node enlargement seen in the abdomen and pelvis. Reproductive: Uterus and bilateral adnexa are unremarkable. Other: Slight anasarca. Minimal presacral fat stranding, nonspecific. There is also some diffuse hazy mesenteric stranding with some small nodes. Not significantly changed. Musculoskeletal: Once again sclerotic bone metastases identified in particular of the pelvis but diffusely involving the osseous structures included in the imaging field. IMPRESSION: Relatively stable extensive osseous metastatic disease. Stable small poorly defined liver lesions. Small adrenal nodules, left-greater-than-right. Left nodule is slightly larger than  prior. No new mass lesion, fluid collection or lymph node enlargement. Stable small pleural effusions, right-greater-than-left. There is wall thickening of the mid to lower esophagus with some mediastinal stranding and fluid. Stable. Colonic diverticula.  Nonspecific hazy mesentery. Stable mild wall thickening of the urinary bladder. Electronically Signed   By: Jill Side M.D.   On: 11/26/2022 19:16

## 2022-12-29 NOTE — Assessment & Plan Note (Signed)
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 Zometa Q 8 weeks.  Continue calcium and vitamin D supplementation. .   

## 2022-12-29 NOTE — Assessment & Plan Note (Signed)
#  Grade 2 neuropathy, continue gabapentin,  Continue gabapentin 600 mg in a.m., 900 mg at bedtime. 

## 2022-12-29 NOTE — Assessment & Plan Note (Signed)
Repeat CXR

## 2022-12-29 NOTE — Assessment & Plan Note (Signed)
Continue port flush every 8 weeks.  Patient prefer for labs to be drawn from port in the future. 

## 2022-12-29 NOTE — Progress Notes (Signed)
Pt here for follow up. Pt reports that she is getting more "winded" easier

## 2022-12-29 NOTE — Assessment & Plan Note (Signed)
Chemotherapy plan as listed above 

## 2022-12-30 LAB — CANCER ANTIGEN 15-3: CA 15-3: 30.7 U/mL — ABNORMAL HIGH (ref 0.0–25.0)

## 2022-12-30 LAB — CANCER ANTIGEN 27.29: CA 27.29: 35.5 U/mL (ref 0.0–38.6)

## 2023-01-05 ENCOUNTER — Ambulatory Visit
Admission: RE | Admit: 2023-01-05 | Discharge: 2023-01-05 | Disposition: A | Discharge status: Home / Self Care | Payer: Medicare PPO | Source: Ambulatory Visit | Attending: Oncology | Admitting: Oncology

## 2023-01-05 DIAGNOSIS — C50919 Malignant neoplasm of unspecified site of unspecified female breast: Secondary | ICD-10-CM | POA: Insufficient documentation

## 2023-01-05 MED ORDER — GADOBUTROL 1 MMOL/ML IV SOLN
9.0000 mL | Freq: Once | INTRAVENOUS | Status: AC | PRN
  Administered 2023-01-05: 9 mL via INTRAVENOUS

## 2023-01-07 ENCOUNTER — Other Ambulatory Visit: Payer: Self-pay | Admitting: Oncology

## 2023-01-30 ENCOUNTER — Other Ambulatory Visit: Payer: Self-pay | Admitting: Oncology

## 2023-02-01 ENCOUNTER — Encounter: Payer: Self-pay | Admitting: Oncology

## 2023-02-18 ENCOUNTER — Ambulatory Visit
Admission: RE | Admit: 2023-02-18 | Discharge: 2023-02-18 | Disposition: A | Discharge status: Home / Self Care | Payer: Medicare PPO | Source: Ambulatory Visit | Attending: Oncology | Admitting: Oncology

## 2023-02-18 ENCOUNTER — Inpatient Hospital Stay: Payer: Medicare PPO | Attending: Oncology

## 2023-02-18 ENCOUNTER — Inpatient Hospital Stay: Payer: Medicare PPO

## 2023-02-18 VITALS — BP 134/98 | HR 106 | Temp 98.4°F | Resp 18

## 2023-02-18 DIAGNOSIS — I480 Paroxysmal atrial fibrillation: Secondary | ICD-10-CM | POA: Diagnosis not present

## 2023-02-18 DIAGNOSIS — C50919 Malignant neoplasm of unspecified site of unspecified female breast: Secondary | ICD-10-CM | POA: Insufficient documentation

## 2023-02-18 DIAGNOSIS — C7972 Secondary malignant neoplasm of left adrenal gland: Secondary | ICD-10-CM | POA: Diagnosis not present

## 2023-02-18 DIAGNOSIS — C787 Secondary malignant neoplasm of liver and intrahepatic bile duct: Secondary | ICD-10-CM | POA: Insufficient documentation

## 2023-02-18 DIAGNOSIS — C50912 Malignant neoplasm of unspecified site of left female breast: Secondary | ICD-10-CM | POA: Diagnosis present

## 2023-02-18 DIAGNOSIS — C7951 Secondary malignant neoplasm of bone: Secondary | ICD-10-CM | POA: Insufficient documentation

## 2023-02-18 DIAGNOSIS — Z87891 Personal history of nicotine dependence: Secondary | ICD-10-CM | POA: Diagnosis not present

## 2023-02-18 DIAGNOSIS — G620 Drug-induced polyneuropathy: Secondary | ICD-10-CM | POA: Insufficient documentation

## 2023-02-18 DIAGNOSIS — Z79811 Long term (current) use of aromatase inhibitors: Secondary | ICD-10-CM | POA: Diagnosis not present

## 2023-02-18 DIAGNOSIS — J90 Pleural effusion, not elsewhere classified: Secondary | ICD-10-CM | POA: Diagnosis not present

## 2023-02-18 DIAGNOSIS — Z7901 Long term (current) use of anticoagulants: Secondary | ICD-10-CM | POA: Diagnosis not present

## 2023-02-18 DIAGNOSIS — Z79899 Other long term (current) drug therapy: Secondary | ICD-10-CM | POA: Insufficient documentation

## 2023-02-18 DIAGNOSIS — T451X5S Adverse effect of antineoplastic and immunosuppressive drugs, sequela: Secondary | ICD-10-CM | POA: Diagnosis not present

## 2023-02-18 DIAGNOSIS — K5730 Diverticulosis of large intestine without perforation or abscess without bleeding: Secondary | ICD-10-CM | POA: Insufficient documentation

## 2023-02-18 DIAGNOSIS — Z923 Personal history of irradiation: Secondary | ICD-10-CM | POA: Insufficient documentation

## 2023-02-18 DIAGNOSIS — Q6102 Congenital multiple renal cysts: Secondary | ICD-10-CM | POA: Diagnosis not present

## 2023-02-18 DIAGNOSIS — Z9221 Personal history of antineoplastic chemotherapy: Secondary | ICD-10-CM | POA: Diagnosis not present

## 2023-02-18 DIAGNOSIS — Z170 Estrogen receptor positive status [ER+]: Secondary | ICD-10-CM | POA: Insufficient documentation

## 2023-02-18 DIAGNOSIS — Z95828 Presence of other vascular implants and grafts: Secondary | ICD-10-CM

## 2023-02-18 DIAGNOSIS — C7971 Secondary malignant neoplasm of right adrenal gland: Secondary | ICD-10-CM | POA: Diagnosis not present

## 2023-02-18 LAB — CBC WITH DIFFERENTIAL (CANCER CENTER ONLY)
Abs Immature Granulocytes: 0.02 10*3/uL (ref 0.00–0.07)
Basophils Absolute: 0.1 10*3/uL (ref 0.0–0.1)
Basophils Relative: 1 %
Eosinophils Absolute: 0.1 10*3/uL (ref 0.0–0.5)
Eosinophils Relative: 2 %
HCT: 34.2 % — ABNORMAL LOW (ref 36.0–46.0)
Hemoglobin: 11.3 g/dL — ABNORMAL LOW (ref 12.0–15.0)
Immature Granulocytes: 1 %
Lymphocytes Relative: 18 %
Lymphs Abs: 0.7 10*3/uL (ref 0.7–4.0)
MCH: 32.3 pg (ref 26.0–34.0)
MCHC: 33 g/dL (ref 30.0–36.0)
MCV: 97.7 fL (ref 80.0–100.0)
Monocytes Absolute: 0.4 10*3/uL (ref 0.1–1.0)
Monocytes Relative: 10 %
Neutro Abs: 2.9 10*3/uL (ref 1.7–7.7)
Neutrophils Relative %: 68 %
Platelet Count: 187 10*3/uL (ref 150–400)
RBC: 3.5 MIL/uL — ABNORMAL LOW (ref 3.87–5.11)
RDW: 14.3 % (ref 11.5–15.5)
WBC Count: 4.2 10*3/uL (ref 4.0–10.5)
nRBC: 0 % (ref 0.0–0.2)

## 2023-02-18 LAB — CMP (CANCER CENTER ONLY)
ALT: 16 U/L (ref 0–44)
AST: 27 U/L (ref 15–41)
Albumin: 4.1 g/dL (ref 3.5–5.0)
Alkaline Phosphatase: 48 U/L (ref 38–126)
Anion gap: 10 (ref 5–15)
BUN: 16 mg/dL (ref 8–23)
CO2: 24 mmol/L (ref 22–32)
Calcium: 9.2 mg/dL (ref 8.9–10.3)
Chloride: 104 mmol/L (ref 98–111)
Creatinine: 1.17 mg/dL — ABNORMAL HIGH (ref 0.44–1.00)
GFR, Estimated: 49 mL/min — ABNORMAL LOW (ref >60)
Glucose, Bld: 102 mg/dL — ABNORMAL HIGH (ref 70–99)
Potassium: 3.9 mmol/L (ref 3.5–5.1)
Sodium: 138 mmol/L (ref 135–145)
Total Bilirubin: 1.1 mg/dL (ref 0.3–1.2)
Total Protein: 7 g/dL (ref 6.5–8.1)

## 2023-02-18 MED ORDER — IOHEXOL 300 MG/ML  SOLN
100.0000 mL | Freq: Once | INTRAMUSCULAR | Status: AC | PRN
  Administered 2023-02-18: 100 mL via INTRAVENOUS

## 2023-02-18 MED ORDER — HEPARIN SOD (PORK) LOCK FLUSH 100 UNIT/ML IV SOLN
250.0000 [IU] | Freq: Once | INTRAVENOUS | Status: DC | PRN
  Filled 2023-02-18: qty 5

## 2023-02-18 MED ORDER — HEPARIN SOD (PORK) LOCK FLUSH 100 UNIT/ML IV SOLN
500.0000 [IU] | Freq: Once | INTRAVENOUS | Status: AC
  Administered 2023-02-18: 500 [IU] via INTRAVENOUS
  Filled 2023-02-18: qty 5

## 2023-02-18 MED ORDER — SODIUM CHLORIDE 0.9% FLUSH
10.0000 mL | Freq: Once | INTRAVENOUS | Status: AC
  Filled 2023-02-18: qty 10

## 2023-02-18 MED ORDER — ZOLEDRONIC ACID 4 MG/100ML IV SOLN
4.0000 mg | Freq: Once | INTRAVENOUS | Status: AC
  Administered 2023-02-18: 4 mg via INTRAVENOUS

## 2023-02-18 MED ORDER — SODIUM CHLORIDE 0.9 % IV SOLN
INTRAVENOUS | Status: DC
  Filled 2023-02-18: qty 250

## 2023-02-18 NOTE — Patient Instructions (Signed)

## 2023-02-20 LAB — CANCER ANTIGEN 15-3: CA 15-3: 42.6 U/mL — ABNORMAL HIGH (ref 0.0–25.0)

## 2023-02-23 ENCOUNTER — Encounter: Payer: Self-pay | Admitting: Oncology

## 2023-02-23 ENCOUNTER — Inpatient Hospital Stay: Payer: Medicare PPO | Admitting: Oncology

## 2023-02-23 ENCOUNTER — Inpatient Hospital Stay: Payer: Medicare PPO

## 2023-02-23 VITALS — BP 121/71 | HR 98 | Temp 95.5°F | Resp 18 | Wt 187.8 lb

## 2023-02-23 DIAGNOSIS — G620 Drug-induced polyneuropathy: Secondary | ICD-10-CM | POA: Diagnosis not present

## 2023-02-23 DIAGNOSIS — Z95828 Presence of other vascular implants and grafts: Secondary | ICD-10-CM

## 2023-02-23 DIAGNOSIS — C50912 Malignant neoplasm of unspecified site of left female breast: Secondary | ICD-10-CM

## 2023-02-23 DIAGNOSIS — C7951 Secondary malignant neoplasm of bone: Secondary | ICD-10-CM | POA: Diagnosis not present

## 2023-02-23 DIAGNOSIS — C50919 Malignant neoplasm of unspecified site of unspecified female breast: Secondary | ICD-10-CM

## 2023-02-23 DIAGNOSIS — I4891 Unspecified atrial fibrillation: Secondary | ICD-10-CM

## 2023-02-23 DIAGNOSIS — Z5111 Encounter for antineoplastic chemotherapy: Secondary | ICD-10-CM

## 2023-02-23 DIAGNOSIS — J90 Pleural effusion, not elsewhere classified: Secondary | ICD-10-CM | POA: Diagnosis not present

## 2023-02-23 DIAGNOSIS — T451X5A Adverse effect of antineoplastic and immunosuppressive drugs, initial encounter: Secondary | ICD-10-CM

## 2023-02-23 DIAGNOSIS — C801 Malignant (primary) neoplasm, unspecified: Secondary | ICD-10-CM

## 2023-02-23 MED ORDER — ABEMACICLIB 100 MG PO TABS
ORAL_TABLET | ORAL | 3 refills | Status: DC
Start: 2023-02-23 — End: 2023-06-16

## 2023-02-23 NOTE — Assessment & Plan Note (Signed)
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 Zometa Q 8 weeks.  Continue calcium and vitamin D supplementation. .  Repeat bone scan.

## 2023-02-23 NOTE — Assessment & Plan Note (Signed)
Intermittent A-fib, now on Eliquis 5 mg twice daily. Abemaciclib is associated with <5% risk of A-fib. Patient has increased risk of bleeding.  Close monitor.   Continue follow-up with cardiology. 

## 2023-02-23 NOTE — Assessment & Plan Note (Signed)
Stable on recent CT.

## 2023-02-23 NOTE — Assessment & Plan Note (Addendum)
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 CT scan  was reviewed and discussed with patient. Stable disease.  Continue Abemaciclib 100 mg twice daily plus letrozole 2.5mg  daily. 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   Repeat MRI brain- stable. - new frontal skull lesion, asymptomatic, check bone scan CT chest abdomen plevis stable.

## 2023-02-23 NOTE — Assessment & Plan Note (Signed)
Continue port flush every 8-10 weeks.  Patient prefer for labs to be drawn from port in the future.

## 2023-02-23 NOTE — Assessment & Plan Note (Signed)
#  Grade 2 neuropathy, continue gabapentin,  Continue gabapentin 600 mg in a.m., 900 mg at bedtime. 

## 2023-02-23 NOTE — Progress Notes (Signed)
Hematology/Oncology Progress note Telephone:([phone] [phone]     CHIEF COMPLAINTS/REASON FOR VISIT:  metastatic breast caner ASSESSMENT & PLAN:   Cancer Staging  Primary malignant neoplasm of breast with metastasis (HCC) Staging form: Breast, AJCC 8th Edition - Clinical stage from 07/12/2021: Stage IV (rcTX, cNX, pM1, ER+, PR+, HER2-) - Signed by Rickard Patience, MD on 03/08/2022   Primary malignant neoplasm of breast with metastasis (HCC) Metastatic Breast cancer with extensive thoracic and bone involvement, in visceral crisis, ER+, PR+ HER2 neg Labs reviewed and discussed with patient. CT scan  was reviewed and discussed with patient. Stable disease.  Continue Abemaciclib 100 mg twice daily plus letrozole 2.5mg  daily. 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   Repeat MRI brain- stable. - new frontal skull lesion, asymptomatic, check bone scan CT chest abdomen plevis stable.   Bilateral pleural effusion Stable on recent CT   Encounter for antineoplastic chemotherapy Chemotherapy plan as listed above   Chemotherapy-induced neuropathy (HCC) #Grade 2 neuropathy, continue gabapentin,  Continue gabapentin 600 mg in a.m., 900 mg at bedtime.   Metastasis [facility] bone [facility]) 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 Zometa Q 8 weeks.  Continue calcium and vitamin D supplementation. .  Repeat bone scan.    A-fib (HCC) Intermittent A-fib, now on Eliquis 5 mg twice daily. Abemaciclib is associated with <5% risk of A-fib. Patient has increased risk of bleeding.  Close monitor.   Continue follow-up with cardiology.  Port-A-Cath in place Continue port flush every 8-10 weeks.  Patient prefer for labs to be drawn from port in the future.     Orders Placed This Encounter  Procedures   NM  Bone Scan Whole Body    Standing Status:   Future    Standing Expiration Date:   02/23/2024    Order Specific Question:   If indicated for the ordered procedure, I authorize the administration of a radiopharmaceutical per Radiology protocol    Answer:   Yes    Order Specific Question:   Preferred imaging location?    Answer:   [facility]   Cancer antigen 15-3    Standing Status:   Future    Standing Expiration Date:   02/23/2024   Cancer antigen 27.29    Standing Status:   Future    Standing Expiration Date:   02/23/2024   CBC with Differential (Cancer Center Only)    Standing Status:   Future    Standing Expiration Date:   02/23/2024   CMP (Cancer Center only)    Standing Status:   Future    Standing Expiration Date:   02/23/2024    Follow up 8 weeks All questions were answered. The patient knows to call the clinic with any problems, questions or concerns.  Rickard Patience, MD, PhD [facility] Hematology Oncology 02/23/2023      HISTORY OF PRESENTING ILLNESS:   Amber Blevins is a  72 y.o.  female with PMH listed below presents for follow up for treatment of metastatic breast cancer.   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 Additional metastatic involvement T12, L1 through L3.  No primary tumor site identified. Top differential considerations are Metastatic Disease  Unknown primary, less likely Lymphoma or Multiple Myeloma.   2. Superimposed lumbar spine degeneration, including degenerative moderate to severe left L3 and L4 nerve level impingement from disc herniation.      07/02/2021 Imaging   CT chest abdomen pelvis showed innumerable small pulmonary and pleural nodules consistent with diffuse metastasis.  Associated with probable malignant pleural effusion.  Mediastinal and hilar lymphadenopathy consistent with metastatic disease.  Hepatic and bilateral adrenal gland metastasis.  Diffuse extensive destructive metastatic bone disease involving pelvis.   07/12/2021 Cancer Staging   Staging form: Breast, AJCC 8th Edition - Clinical stage from 07/12/2021: Stage IV (rcTX, cNX, pM1, ER+, PR+, HER2-) - Signed by Rickard Patience, MD on 03/08/2022 Stage prefix: Recurrence   07/17/2021 Initial Diagnosis   Metastatic breast cancer -history of breast cancer, diagnosed in 2010 Left-sided T2 (2.4cm) N0, ER/PR positive, HER2 negative IDC of the breast, s/p lumpectomy by Dr.Meyer at [facility]. Oncotype score of 11 who completed radiation and she finished 10 years of Femara from 05/2009 and stopped in 2020.  -07/12/2021 patient underwent right thoracentesis.  Cytology was positive for metastatic carcinoma, compatible with breast origin.  ER/PR +, HER2 negative -07/16/2021, patient underwent iliac bone biopsy.  Positive for metastatic carcinoma.   07/21/2021 - 09/16/2021 Chemotherapy   Patient is on Treatment Plan : BREAST Paclitaxel D1,8,15 q28d     09/04/2021 Imaging   CT chest abdomen pelvis showed improvement of the pulmonary nodularity and thickening in the left and the right lung.  Persistent nodular interstitial and pleural thickening remains.  Improvement of mediastinal and hilar adenopathy, hepatic metastatic lesions are similar.  Adrenal gland metastasis is similar.  Interval increase in the sclerotic bone metastasis.    09/30/2021 -  Chemotherapy   started on abemaciclib  100 mg twice daily and letrozole.   02/24/2022 Imaging    CT chest abdomen pelvis with contrast showed unchanged bilateral pleural effusion with associated intralobular septal thickening of the lung bases.  Without discrete nodularity consistent with stable appearance of treated pleural and lymphangitic metastatic disease.  Stable or minimally diminished subcentimeter liver lesions.  Unchanged left adrenal metastasis.  Unchanged widespread sclerotic osseous metastatic disease involving the included axial and appendicular skeleton.  No evidence of new metastatic disease in the chest abdomen or pelvis.  Coronary artery disease.   03/06/2022 Procedure   Ultrasound guided thoracentesis, removal of pleural fluid.    05/29/2022 Imaging   CT chest abdomen pelvis 1. Stable CT of the chest, abdomen and pelvis. 2. Unchanged appearance of bilateral pleural effusions, pleural nodularity, and interlobular septal thickening compatible with pleural and lymphangitic metastatic disease. 3. Stable appearance of multifocal sclerotic bone metastases. 4. Multifocal low-attenuation liver lesions are unchanged in the interval. 5. Stable appearance of left adrenal gland metastases. 6. Stable appearance of small pericardial effusion.7. No new or progressive disease identified.8. Aortic Atherosclerosis (ICD10-I70.0).   06/06/2022 Imaging   Bone scan Constellation of findings are consistent with multifocal osseous metastatic disease throughout the axial and appendicular skeleton.Of note, osseous metastatic disease involves the RIGHT greater than LEFT femur. Consider evaluation  with dedicated hip and femur radiographs to evaluate for overall disease burden and potential risk for pathologic fracture.   08/31/2022 Imaging   CT chest abdomen pelvis w contrast 1. Unchanged small right, trace left pleural effusions with associated diffuse interlobular septal thickening and fine fissural and perilymphatic nodularity throughout the  lungs, findings consistent with lymphangitic metastatic disease. 2. Stable or slightly diminished very subtle hypodense lesion of the central right lobe of the liver, hepatic segment VIII measuring 0.6 cm, previously 0.8 cm. Additional unchanged, very subtle hypodense lesion of hepatic segment IV B measuring 1.1 cm. 3. Unchanged widespread sclerotic osseous metastatic disease throughout the included axial and proximal appendicular skeleton, particularly dense in the lower lumbar spine and sacrum. 4. Unchanged left adrenal nodule measuring 1.7 x 1.1 cm. 5. Overall constellation of findings is consistent with stable metastatic disease. 6. Coronary artery disease.    12/01/2022 Miscellaneous   [facility] liquid biopsy - PIK3CA mutation. 0.1% Not enough tissue for tissue NGS    Patient follows up with cardiology for evaluation of bradycardia.  Cardiology notes reviewed.  72-hour Holter monitor revealed revealed predominant sinus rhythm with mean heart rate of 96 bpm, heart rate range 53 to 129 bpm, frequent premature ventricular contractions (27% burden), and intermittent atrial fibrillation (5% burden) with longest episode 3 hours. CHA2DS2-VASc score of 3, per cardiology recommendation patient was started on Eliquis 5 mg twice daily.     INTERVAL HISTORY Amber Blevins is a 72 y.o. female who has above history reviewed by me today presents for follow up visit for metastatic breast cancer Occasional back and hip pain..  Denies any headache, nausea vomiting diarrhea.  Fever or chills. She reports feeling well.  She has no new complains.   Review of Systems  Constitutional:  Negative for appetite change, chills, fatigue and fever.  HENT:   Negative for hearing loss and voice change.   Eyes:  Negative for eye problems.  Respiratory:  Negative for chest tightness, cough and shortness of breath.   Cardiovascular:  Negative for chest pain.  Gastrointestinal:  Negative for abdominal  distention, abdominal pain and blood in stool.  Endocrine: Negative for hot flashes.  Genitourinary:  Negative for difficulty urinating and frequency.   Musculoskeletal:  Negative for arthralgias.  Skin:  Negative for itching and rash.  Neurological:  Positive for numbness. Negative for extremity weakness.  Hematological:  Negative for adenopathy.  Psychiatric/Behavioral:  Negative for confusion.      MEDICAL HISTORY:  Past Medical History:  Diagnosis Date   A-fib [facility])    Breast cancer (HCC)    COPD (chronic obstructive pulmonary disease) (HCC)    Family history of adverse reaction to anesthesia    brother has problem with coming out of anesthesia   High cholesterol    Hypertension    Personal history of radiation therapy    Pre-diabetes     SURGICAL HISTORY: Past Surgical History:  Procedure Laterality Date   BREAST LUMPECTOMY Left    2010   COLONOSCOPY     EYE SURGERY Left    Cataract surgery   PORTACATH PLACEMENT N/A 08/01/2021   Procedure: INSERTION PORT-A-CATH;  Surgeon: Carolan Shiver, MD;  Location: [facility] ORS;  Service: General;  Laterality: N/A;   thorocentesis Right 07/10/2021   and another on 07/25/21    SOCIAL HISTORY: Social History   Socioeconomic History   Marital status: Married    Spouse name: Not on file   Number of children: Not on file  Years of education: Not on file   Highest education level: Not on file  Occupational History   Not on file  Tobacco Use   Smoking status: Former    Packs/day: 1.00    Years: 25.00    Additional pack years: 0.00    Total pack years: 25.00    Types: Cigarettes    Quit date: 70    Years since quitting: 34.4   Smokeless tobacco: Never  Vaping Use   Vaping Use: Never used  Substance and Sexual Activity   Alcohol use: Not Currently   Drug use: Never   Sexual activity: Not on file  Other Topics Concern   Not on file  Social History Narrative   Not on file   Social Determinants of Health    Financial Resource Strain: Not on file  Food Insecurity: Not on file  Transportation Needs: Not on file  Physical Activity: Not on file  Stress: Not on file  Social Connections: Not on file  Intimate Partner Violence: Not on file    FAMILY HISTORY: Family History  Problem Relation Age of Onset   Cancer Mother        gynecological   Lung cancer Mother    Diabetes Father    Heart disease Father    Parkinson's disease Father    Brain cancer Brother    Bladder Cancer Brother    Pulmonary disease Brother    Rheumatic fever Brother    Breast cancer Neg Hx     ALLERGIES:  is allergic to green tea (camellia sinensis), melaleuca viridiflora, tea tree oil, and lisinopril.  MEDICATIONS:  Current Outpatient Medications  Medication Sig Dispense Refill   acetaminophen (TYLENOL) 650 MG CR tablet Take 650 mg by mouth every 8 (eight) hours as needed for pain.     atorvastatin (LIPITOR) 40 MG tablet Take 40 mg by mouth at bedtime.     Boswellia-Glucosamine-Vit D (OSTEO BI-FLEX-GLUCOS/5-LOXIN) TABS Take 1 capsule by mouth in the morning and at bedtime.     Calcium Carb-Cholecalciferol 600-400 MG-UNIT TABS Take 1 tablet by mouth daily.     cyanocobalamin 1000 MCG tablet Take 1,000 mcg by mouth daily.     Docusate Sodium (DSS) 100 MG CAPS Take 1 capsule by mouth daily.     ELIQUIS 5 MG TABS tablet Take 5 mg by mouth 2 (two) times daily.     gabapentin (NEURONTIN) 300 MG capsule TAKE 2 CAPSULES BY MOUTH IN THE MORNING AND 3 CAPSULES IN THE EVENING 150 capsule 0   letrozole (FEMARA) 2.5 MG tablet Take 1 tablet (2.5 mg total) by mouth daily. 90 tablet 2   lidocaine-prilocaine (EMLA) cream Apply 1 application. topically as needed. 30 g 5   LORazepam (ATIVAN) 0.5 MG tablet TAKE 1 TABLET BY MOUTH AT BEDTIME AS NEEDED FOR ANXIETY 30 tablet 0   losartan (COZAAR) 50 MG tablet Take 50 mg by mouth daily.     magnesium chloride (SLOW-MAG) 64 MG TBEC SR tablet Take 1 tablet by mouth at bedtime.      melatonin (MELATONIN MAXIMUM STRENGTH) 5 MG TABS Take 1 tablet by mouth at bedtime.     Turmeric (QC TUMERIC COMPLEX PO) Take 1 capsule by mouth at bedtime.     abemaciclib (VERZENIO) 100 MG tablet Take 1 tablet (100 mg total) by mouth 2 (two) times daily. Swallow tablets whole. Do not chew, crush, or split tablets before swallowing. 56 tablet 3   diphenoxylate-atropine (LOMOTIL) 2.5-0.025 MG tablet Take 1 tablet  by mouth 4 (four) times daily as needed for diarrhea or loose stools. (Patient not taking: Reported on 12/01/2022) 30 tablet 0   Loperamide HCl (IMODIUM PO) Take by mouth. (Patient not taking: Reported on 12/01/2022)     ondansetron (ZOFRAN) 8 MG tablet Take by mouth every 8 (eight) hours as needed for nausea or vomiting. (Patient not taking: Reported on 11/02/2022)     prochlorperazine (COMPAZINE) 5 MG tablet Take 5 mg by mouth every 6 (six) hours as needed for nausea or vomiting. (Patient not taking: Reported on 12/01/2022)     No current facility-administered medications for this visit.   Facility-Administered Medications Ordered in Other Visits  Medication Dose Route Frequency Provider Last Rate Last Admin   sodium chloride flush (NS) 0.9 % injection 10 mL  10 mL Intravenous Once Rickard Patience, MD         PHYSICAL EXAMINATION: ECOG PERFORMANCE STATUS: 1 - Symptomatic but completely ambulatory Vitals:   02/23/23 1428  BP: 121/71  Pulse: 98  Resp: 18  Temp: (!) 95.5 F (35.3 C)  SpO2: 100%   Filed Weights   02/23/23 1428  Weight: 187 lb 12.8 oz (85.2 kg)    Physical Exam Constitutional:      General: She is not in acute distress.    Appearance: She is not diaphoretic.  HENT:     Head: Normocephalic and atraumatic.     Nose: Nose normal.     Mouth/Throat:     Pharynx: No oropharyngeal exudate.  Eyes:     General: No scleral icterus.    Pupils: Pupils are equal, round, and reactive to light.  Cardiovascular:     Rate and Rhythm: Normal rate and regular rhythm.     Heart  sounds: No murmur heard. Pulmonary:     Effort: Pulmonary effort is normal. No respiratory distress.     Comments: Decreased breath sound bilaterally. Abdominal:     General: There is no distension.     Palpations: Abdomen is soft.     Tenderness: There is no abdominal tenderness.  Musculoskeletal:        General: Normal range of motion.     Cervical back: Normal range of motion and neck supple.  Skin:    General: Skin is warm and dry.     Findings: No erythema.  Neurological:     Mental Status: She is alert and oriented to person, place, and time.     Cranial Nerves: No cranial nerve deficit.     Motor: No abnormal muscle tone.     Coordination: Coordination normal.  Psychiatric:        Mood and Affect: Affect normal.       LABORATORY DATA:  I have reviewed the data as listed     Latest Ref Rng & Units 02/18/2023    9:00 AM 12/29/2022    8:55 AM 12/01/2022    9:59 AM  CBC  WBC 4.0 - 10.5 K/uL 4.2  4.2  4.0   Hemoglobin 12.0 - 15.0 g/dL 40.9  81.1  91.4   Hematocrit 36.0 - 46.0 % 34.2  33.7  34.3   Platelets 150 - 400 K/uL 187  197  180       Latest Ref Rng & Units 02/18/2023    9:00 AM 12/29/2022    8:55 AM 12/01/2022    9:59 AM  CMP  Glucose 70 - 99 mg/dL 782  956  213   BUN 8 - 23 mg/dL 16  19  14   Creatinine 0.44 - 1.00 mg/dL 1.61  0.96  0.45   Sodium 135 - 145 mmol/L 138  136  137   Potassium 3.5 - 5.1 mmol/L 3.9  3.7  3.8   Chloride 98 - 111 mmol/L 104  106  107   CO2 22 - 32 mmol/L 24  25  26    Calcium 8.9 - 10.3 mg/dL 9.2  8.9  8.9   Total Protein 6.5 - 8.1 g/dL 7.0  6.5  6.5   Total Bilirubin 0.3 - 1.2 mg/dL 1.1  0.8  1.0   Alkaline Phos 38 - 126 U/L 48  54  45   AST 15 - 41 U/L 27  30  25    ALT 0 - 44 U/L 16  17  15       RADIOGRAPHIC STUDIES: I have personally reviewed the radiological images as listed and agreed with the findings in the report. CT CHEST ABDOMEN PELVIS W CONTRAST  Result Date: 02/23/2023 CLINICAL DATA:  Breast cancer follow-up with  metastases. * Tracking Code: BO * EXAM: CT CHEST, ABDOMEN, AND PELVIS WITH CONTRAST TECHNIQUE: Multidetector CT imaging of the chest, abdomen and pelvis was performed following the standard protocol during bolus administration of intravenous contrast. RADIATION DOSE REDUCTION: This exam was performed according to the departmental dose-optimization program which includes automated exposure control, adjustment of the mA and/or kV according to patient size and/or use of iterative reconstruction technique. CONTRAST:  OMNIPAQUE IOHEXOL 300 MG/ML  SOLN COMPARISON:  11/26/2022 and older FINDINGS: CT CHEST FINDINGS Cardiovascular: Right upper chest port. Port is accessed. Heart is nonenlarged but there is small pericardial effusion, stable from previous. Normal caliber thoracic aorta with some scattered vascular calcifications. Mediastinum/Nodes: Normal caliber thoracic esophagus. Thickening along the wall of the distal esophagus is less apparent today. Thyroid gland is unremarkable. No specific abnormal lymph node enlargement identified in the axillary region, hilum, chest wall, supraclavicular, internal mammary or mediastinum. Surgical clips in the left axillary region. Continued skin thickening along the left breast. Slight spiculated area of tissue superiorly and laterally, unchanged on image 20 of series 2. Lungs/Pleura: Small right and tiny left pleural effusions are stable. There are some scattered areas of interstitial septal thickening bilaterally, likely scarring and fibrotic change. No consolidation or pneumothorax. No new dominant lung lesion. Musculoskeletal: Scattered sclerotic bone lesions are identified along the spine, clavicle, ribs, sternum. Extent and distribution by CT is similar to previous examination additional evaluation as clinically appropriate. Stable associated compression of T7 and T10. CT ABDOMEN PELVIS FINDINGS Hepatobiliary: Patent portal vein. Gallbladder is present. Mild focal  thickening along the fundus of the gallbladder is stable and could be an area of adenomyomatosis. Previously there were some faint low-density lesions which are even further hardware seen today compared to prior. Example in segment 4A previously measured 6 mm, today has a vague low-density area measuring 5 mm on series 2, image 48. Focus in segment 4B which previously measured 12 mm, today has poorly defined margins on series 2, image 54 and is estimated in diameter at 10 mm. No new liver lesions identified. Patent portal vein. Pancreas: Unremarkable. No pancreatic ductal dilatation or surrounding inflammatory changes. Spleen: Normal in size without focal abnormality. Adrenals/Urinary Tract: Right adrenal gland is preserved. There is stable left adrenal nodule which previously measured 18 x 13 mm and today on series 2, image 55 measures 18 x 12 mm. No enhancing renal mass. Multiple bilateral parapelvic renal cysts. Preserved contours of  the urinary bladder. Stomach/Bowel: Diffuse colonic stool. Moderate stool burden. Scattered left-sided colonic diverticula. Normal appendix. Stomach is mildly distended with fluid. Small bowel is nondilated. Vascular/Lymphatic: Moderate vascular calcifications. Normal caliber aorta and IVC. No specific abnormal lymph node enlargement identified in the abdomen and pelvis. Reproductive: Uterus and bilateral adnexa are unremarkable. Other: No free air or free fluid. There is some central mesenteric haziness but unchanged from previous examination. Musculoskeletal: Scattered bony is areas of sclerosis again identified consistent with known osseous metastatic disease. Diffuse distribution involving pelvis, femurs and spine. By CT distribution appears similar. Additional evaluation as clinically appropriate IMPRESSION: Overall no significant interval change. Stable sclerotic bone metastases. Small liver lesions are becoming more faint.  No new liver lesions. Stable small adrenal nodules.  No developing new mass lesion, fluid collection or lymph node enlargement. Stable central mesenteric haziness. Moderate stool with scattered colonic diverticula. Stable small right and tiny left pleural effusion Electronically Signed   By: Karen Kays M.D.   On: 02/23/2023 10:17   MR Brain W Wo Contrast  Result Date: 01/07/2023 CLINICAL DATA:  Breast cancer. EXAM: MRI HEAD WITHOUT AND WITH CONTRAST TECHNIQUE: Multiplanar, multiecho pulse sequences of the brain and surrounding structures were obtained without and with intravenous contrast. CONTRAST:  9mL GADAVIST GADOBUTROL 1 MMOL/ML IV SOLN COMPARISON:  Head MRI 07/26/2021 FINDINGS: Brain: There is no evidence of an acute infarct, intracranial hemorrhage, mass, midline shift, or extra-axial fluid collection. The ventricles and sulci are normal. Scattered small T2 hyperintensities in the cerebral white matter and pons are similar to the prior study and are nonspecific but compatible with minimal chronic small vessel ischemic disease. No abnormal enhancement is identified. Vascular: Major intracranial vascular flow voids are preserved. Skull and upper cervical spine: New 1.4 cm enhancing lesion in the right frontal skull. Sinuses/Orbits: Left cataract extraction. Left maxillary sinus mucous retention cyst. Clear mastoid air cells. Other: None. IMPRESSION: 1. New enhancing right frontal skull lesion consistent with an osseous metastasis. 2. No evidence of intracranial metastases. Electronically Signed   By: Sebastian Ache M.D.   On: 01/07/2023 17:19   DG Chest 2 View  Result Date: 12/30/2022 CLINICAL DATA:  shortness of breath EXAM: CHEST - 2 VIEW COMPARISON:  04/15/2022 FINDINGS: Cardiac silhouette is unremarkable. No pneumothorax or pleural effusion. The lungs are clear. Aorta is calcified. Lower thoracic compression deformity is stable finding. IMPRESSION: No acute cardiopulmonary process. Electronically Signed   By: Layla Maw M.D.   On: 12/30/2022  14:58   CT CHEST ABDOMEN PELVIS W CONTRAST  Result Date: 11/26/2022 CLINICAL DATA:  Staging metastatic breast cancer. Previous left lumpectomy and radiation. * Tracking Code: BO * EXAM: CT CHEST, ABDOMEN, AND PELVIS WITH CONTRAST TECHNIQUE: Multidetector CT imaging of the chest, abdomen and pelvis was performed following the standard protocol during bolus administration of intravenous contrast. RADIATION DOSE REDUCTION: This exam was performed according to the departmental dose-optimization program which includes automated exposure control, adjustment of the mA and/or kV according to patient size and/or use of iterative reconstruction technique. CONTRAST:  OMNIPAQUE IOHEXOL 300 MG/ML  SOLN COMPARISON:  CT 08/31/2022 FINDINGS: CT CHEST FINDINGS Cardiovascular: Right upper chest port. Trace pericardial fluid. Heart is nonenlarged. There are some coronary artery calcifications identified, mild. The thoracic aorta has a normal course and caliber with mild atherosclerotic calcified plaque. Mediastinum/Nodes: Preserved thyroid gland. There is some wall thickening along the mid to lower thoracic esophagus with some mediastinal fluid and stranding. This was seen previously and has a  similar distribution. Small hiatal hernia suggested. Surgical clip along the left axillary region. Left breast skin thickening. Mild breast fat stranding. No specific abnormal lymph node enlargement identified including internal mammary chain, chest wall, supraclavicular, axillary, hila or mediastinum. Lungs/Pleura: Persistent small right and tiny left pleural effusion. There are peripheral areas of interstitial septal thickening noted in both lungs, scarring and fibrotic change. Minimal areas of ground-glass are again seen and unchanged. Areas of mild peribronchial thickening. Diffuse calcifications patchy along the tracheobronchial tree. No frank consolidation. No dominant new lung nodule. Musculoskeletal: Mild degenerative changes  noted along the spine. 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 Similar areas along spine, pelvis, femurs, sternum, ribs, clavicle and humeri. Similar distribution compared to previous when adjusting for technique. If needed a whole-body bone scan could be considered to further delineate. There is some compression deformities seen at T10 and T7 which are unchanged from previous. CT ABDOMEN PELVIS FINDINGS Hepatobiliary: Fatty liver infiltration. The faint low-density lesion seen in segment 4 measuring 6 mm on the prior stable today on series 2, image 46. Lesion seen more caudal in segment 4B which on the prior measured 11 mm, today measures 12 mm on image 51 of series 2 and is also somewhat faint. No other new clear space-occupying liver lesion. Gallbladder is nondilated. Patent portal vein. Pancreas: Unremarkable. No pancreatic ductal dilatation or surrounding inflammatory changes. Spleen: Normal in size without focal abnormality. Adrenals/Urinary Tract: Bilateral adrenal nodules are seen, left-greater-than-right. The left-sided lesion was measured 17 x 11 mm previously today 18 by 13 mm. Slightly larger. No enhancing renal mass or collecting system dilatation. Bilateral parapelvic renal cysts. The ureters have normal course and caliber extending down to the bladder. Bladder has a slightly thickened wall but is underdistended. Thickening was seen previously. Please correlate for any symptomatology. Stomach/Bowel: There is a large amount of colonic stool. Scattered left-sided colonic diverticula. No bowel obstruction, free air or free fluid. Normal retrocecal appendix in the right hemipelvis. The stomach is mildly distended with fluid. Small bowel is nondilated. Vascular/Lymphatic: Diffuse vascular calcifications. Normal caliber aorta and IVC. No specific abnormal lymph node enlargement seen in the abdomen and pelvis. Reproductive: Uterus and bilateral adnexa are unremarkable. Other: Slight anasarca.  Minimal presacral fat stranding, nonspecific. There is also some diffuse hazy mesenteric stranding with some small nodes. Not significantly changed. Musculoskeletal: Once again sclerotic bone metastases identified in particular of the pelvis but diffusely involving the osseous structures included in the imaging field. IMPRESSION: Relatively stable extensive osseous metastatic disease. Stable small poorly defined liver lesions. Small adrenal nodules, left-greater-than-right. Left nodule is slightly larger than prior. No new mass lesion, fluid collection or lymph node enlargement. Stable small pleural effusions, right-greater-than-left. There is wall thickening of the mid to lower esophagus with some mediastinal stranding and fluid. Stable. Colonic diverticula.  Nonspecific hazy mesentery. Stable mild wall thickening of the urinary bladder. Electronically Signed   By: Karen Kays M.D.   On: 11/26/2022 19:16

## 2023-02-23 NOTE — Assessment & Plan Note (Signed)
Chemotherapy plan as listed above 

## 2023-02-25 ENCOUNTER — Other Ambulatory Visit: Payer: Medicare PPO

## 2023-02-26 LAB — CANCER ANTIGEN 27.29: CA 27.29: 57.2 U/mL — ABNORMAL HIGH (ref 0.0–38.6)

## 2023-03-01 ENCOUNTER — Ambulatory Visit: Payer: Medicare PPO

## 2023-03-01 ENCOUNTER — Ambulatory Visit: Payer: Medicare PPO | Admitting: Oncology

## 2023-03-03 ENCOUNTER — Other Ambulatory Visit: Payer: Medicare PPO

## 2023-03-08 ENCOUNTER — Ambulatory Visit
Admission: RE | Admit: 2023-03-08 | Discharge: 2023-03-08 | Disposition: A | Discharge status: Home / Self Care | Payer: Medicare PPO | Source: Ambulatory Visit | Attending: Oncology | Admitting: Oncology

## 2023-03-08 DIAGNOSIS — C801 Malignant (primary) neoplasm, unspecified: Secondary | ICD-10-CM | POA: Diagnosis present

## 2023-03-08 DIAGNOSIS — C50919 Malignant neoplasm of unspecified site of unspecified female breast: Secondary | ICD-10-CM | POA: Diagnosis present

## 2023-03-08 DIAGNOSIS — C7951 Secondary malignant neoplasm of bone: Secondary | ICD-10-CM | POA: Insufficient documentation

## 2023-03-08 MED ORDER — TECHNETIUM TC 99M MEDRONATE IV KIT
20.0000 | PACK | Freq: Once | INTRAVENOUS | Status: AC | PRN
  Administered 2023-03-08: 21.58 via INTRAVENOUS

## 2023-03-09 ENCOUNTER — Other Ambulatory Visit: Payer: Self-pay | Admitting: Oncology

## 2023-03-12 ENCOUNTER — Encounter: Payer: Self-pay | Admitting: Oncology

## 2023-03-31 ENCOUNTER — Other Ambulatory Visit: Payer: Self-pay | Admitting: Oncology

## 2023-04-22 ENCOUNTER — Inpatient Hospital Stay: Payer: Medicare PPO | Attending: Oncology

## 2023-04-22 ENCOUNTER — Inpatient Hospital Stay: Payer: Medicare PPO

## 2023-04-22 VITALS — BP 136/80 | HR 86 | Temp 98.0°F | Resp 16

## 2023-04-22 DIAGNOSIS — C50919 Malignant neoplasm of unspecified site of unspecified female breast: Secondary | ICD-10-CM | POA: Insufficient documentation

## 2023-04-22 DIAGNOSIS — Z170 Estrogen receptor positive status [ER+]: Secondary | ICD-10-CM | POA: Insufficient documentation

## 2023-04-22 DIAGNOSIS — C7951 Secondary malignant neoplasm of bone: Secondary | ICD-10-CM | POA: Diagnosis not present

## 2023-04-22 LAB — CBC WITH DIFFERENTIAL (CANCER CENTER ONLY)
Abs Immature Granulocytes: 0.02 10*3/uL (ref 0.00–0.07)
Basophils Absolute: 0 10*3/uL (ref 0.0–0.1)
Basophils Relative: 1 %
Eosinophils Absolute: 0.1 10*3/uL (ref 0.0–0.5)
Eosinophils Relative: 2 %
HCT: 33.1 % — ABNORMAL LOW (ref 36.0–46.0)
Hemoglobin: 10.8 g/dL — ABNORMAL LOW (ref 12.0–15.0)
Immature Granulocytes: 1 %
Lymphocytes Relative: 16 %
Lymphs Abs: 0.7 10*3/uL (ref 0.7–4.0)
MCH: 31.9 pg (ref 26.0–34.0)
MCHC: 32.6 g/dL (ref 30.0–36.0)
MCV: 97.6 fL (ref 80.0–100.0)
Monocytes Absolute: 0.4 10*3/uL (ref 0.1–1.0)
Monocytes Relative: 9 %
Neutro Abs: 2.9 10*3/uL (ref 1.7–7.7)
Neutrophils Relative %: 71 %
Platelet Count: 174 10*3/uL (ref 150–400)
RBC: 3.39 MIL/uL — ABNORMAL LOW (ref 3.87–5.11)
RDW: 14.2 % (ref 11.5–15.5)
WBC Count: 4 10*3/uL (ref 4.0–10.5)
nRBC: 0 % (ref 0.0–0.2)

## 2023-04-22 LAB — CMP (CANCER CENTER ONLY)
ALT: 20 U/L (ref 0–44)
AST: 31 U/L (ref 15–41)
Albumin: 3.8 g/dL (ref 3.5–5.0)
Alkaline Phosphatase: 55 U/L (ref 38–126)
Anion gap: 7 (ref 5–15)
BUN: 20 mg/dL (ref 8–23)
CO2: 25 mmol/L (ref 22–32)
Calcium: 9.1 mg/dL (ref 8.9–10.3)
Chloride: 104 mmol/L (ref 98–111)
Creatinine: 1.05 mg/dL — ABNORMAL HIGH (ref 0.44–1.00)
GFR, Estimated: 56 mL/min — ABNORMAL LOW (ref >60)
Glucose, Bld: 144 mg/dL — ABNORMAL HIGH (ref 70–99)
Potassium: 3.8 mmol/L (ref 3.5–5.1)
Sodium: 136 mmol/L (ref 135–145)
Total Bilirubin: 0.6 mg/dL (ref 0.3–1.2)
Total Protein: 6.7 g/dL (ref 6.5–8.1)

## 2023-04-22 MED ORDER — SODIUM CHLORIDE 0.9 % IV SOLN
INTRAVENOUS | Status: DC
  Filled 2023-04-22 (×2): qty 250

## 2023-04-22 MED ORDER — HEPARIN SOD (PORK) LOCK FLUSH 100 UNIT/ML IV SOLN
250.0000 [IU] | Freq: Once | INTRAVENOUS | Status: AC | PRN
  Administered 2023-04-22: 250 [IU]
  Filled 2023-04-22: qty 5

## 2023-04-22 MED ORDER — SODIUM CHLORIDE 0.9% FLUSH
10.0000 mL | Freq: Once | INTRAVENOUS | Status: AC
  Administered 2023-04-22: 10 mL via INTRAVENOUS
  Filled 2023-04-22: qty 10

## 2023-04-22 MED ORDER — ZOLEDRONIC ACID 4 MG/100ML IV SOLN
4.0000 mg | Freq: Once | INTRAVENOUS | Status: AC
  Administered 2023-04-22: 4 mg via INTRAVENOUS
  Filled 2023-04-22: qty 100

## 2023-04-22 NOTE — Patient Instructions (Signed)

## 2023-04-26 ENCOUNTER — Encounter: Payer: Self-pay | Admitting: Oncology

## 2023-04-26 ENCOUNTER — Inpatient Hospital Stay: Payer: Medicare PPO | Admitting: Oncology

## 2023-04-26 VITALS — BP 132/66 | HR 100 | Temp 97.1°F | Resp 18 | Wt 188.3 lb

## 2023-04-26 DIAGNOSIS — C50919 Malignant neoplasm of unspecified site of unspecified female breast: Secondary | ICD-10-CM | POA: Diagnosis not present

## 2023-04-26 DIAGNOSIS — G620 Drug-induced polyneuropathy: Secondary | ICD-10-CM

## 2023-04-26 DIAGNOSIS — I4891 Unspecified atrial fibrillation: Secondary | ICD-10-CM

## 2023-04-26 DIAGNOSIS — C7951 Secondary malignant neoplasm of bone: Secondary | ICD-10-CM

## 2023-04-26 DIAGNOSIS — T451X5A Adverse effect of antineoplastic and immunosuppressive drugs, initial encounter: Secondary | ICD-10-CM

## 2023-04-26 DIAGNOSIS — Z5111 Encounter for antineoplastic chemotherapy: Secondary | ICD-10-CM | POA: Diagnosis not present

## 2023-04-26 DIAGNOSIS — J90 Pleural effusion, not elsewhere classified: Secondary | ICD-10-CM | POA: Diagnosis not present

## 2023-04-26 NOTE — Progress Notes (Signed)
Hematology/Oncology Progress note [phone] [phone]     CHIEF COMPLAINTS/REASON FOR VISIT:  metastatic breast caner ASSESSMENT & PLAN:   Cancer Staging  Primary malignant neoplasm of breast with metastasis (HCC) Staging form: Breast, AJCC 8th Edition - Clinical stage from 07/12/2021: Stage IV (rcTX, cNX, pM1, ER+, PR+, HER2-) - Signed by Rickard Patience, MD on 03/08/2022   Primary malignant neoplasm of breast with metastasis (HCC) Metastatic Breast cancer with extensive thoracic and bone involvement, in visceral crisis, ER+, PR+ HER2 neg Labs reviewed and discussed with patient. Continue Abemaciclib 100 mg twice daily plus letrozole 2.5mg  daily. 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   Repeat MRI brain- stable. - new frontal skull lesion, asymptomatic,  Stable tumor markers, repeat CT before next visit   Bilateral pleural effusion Stable on recent CT   Encounter for antineoplastic chemotherapy Chemotherapy plan as listed above   Chemotherapy-induced neuropathy (HCC) #Grade 2 neuropathy, continue gabapentin,  Continue gabapentin 600 mg in a.m., 900 mg at bedtime.   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 Zometa Q 8 weeks.  Continue calcium and vitamin D supplementation. .  Repeat bone scan-result reviewed.   A-fib (HCC) Intermittent A-fib, now on Eliquis 5 mg twice daily. Abemaciclib is associated with <5% risk of A-fib. Patient has increased risk of bleeding.  Close monitor.   Continue follow-up with cardiology.     Orders Placed This Encounter  Procedures   CT CHEST ABDOMEN PELVIS W CONTRAST    Standing Status:   Future    Standing Expiration Date:   04/25/2024    Order Specific Question:   If indicated for the ordered procedure, I  authorize the administration of contrast media per Radiology protocol    Answer:   Yes    Order Specific Question:   Does the patient have a contrast media/X-ray dye allergy?    Answer:   No    Order Specific Question:   Preferred imaging location?    Answer:   [facility]    Order Specific Question:   If indicated for the ordered procedure, I authorize the administration of oral contrast media per Radiology protocol    Answer:   Yes   CBC with Differential (Cancer Center Only)    Standing Status:   Future    Standing Expiration Date:   04/25/2024   CMP (Cancer Center only)    Standing Status:   Future    Standing Expiration Date:   04/25/2024   Cancer antigen 27.29    Standing Status:   Future    Standing Expiration Date:   04/25/2024   Cancer antigen 15-3    Standing Status:   Future    Standing Expiration Date:   04/25/2024    Follow up 8 weeks All questions were answered. The patient knows to call the clinic with any problems, questions or concerns.  Rickard Patience, MD, PhD [facility] Hematology Oncology 04/26/2023      HISTORY OF PRESENTING ILLNESS:   Amber Blevins is a  72 y.o.  female with PMH listed below presents for follow up for treatment of metastatic breast cancer.   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 Additional metastatic involvement T12, L1 through  L3.  No primary tumor site identified. Top differential considerations are Metastatic Disease Unknown primary, less likely Lymphoma or Multiple Myeloma.   2. Superimposed lumbar spine degeneration, including degenerative moderate to severe left L3 and L4 nerve level impingement from disc herniation.      07/02/2021 Imaging   CT chest abdomen pelvis showed innumerable small pulmonary and pleural nodules consistent with diffuse metastasis.  Associated with probable malignant pleural effusion.  Mediastinal and hilar lymphadenopathy consistent with metastatic disease.  Hepatic and bilateral adrenal gland metastasis.  Diffuse extensive destructive metastatic bone disease involving pelvis.   07/12/2021 Cancer Staging   Staging form: Breast, AJCC 8th Edition - Clinical stage from 07/12/2021: Stage IV (rcTX, cNX, pM1, ER+, PR+, HER2-) - Signed by Rickard Patience, MD on 03/08/2022 Stage prefix: Recurrence   07/17/2021 Initial Diagnosis   Metastatic breast cancer -history of breast cancer, diagnosed in 2010 Left-sided T2 (2.4cm) N0, ER/PR positive, HER2 negative IDC of the breast, s/p lumpectomy by Dr.Meyer at [facility]. Oncotype score of 11 who completed radiation and she finished 10 years of Femara from 05/2009 and stopped in 2020.  -07/12/2021 patient underwent right thoracentesis.  Cytology was positive for metastatic carcinoma, compatible with breast origin.  ER/PR +, HER2 negative -07/16/2021, patient underwent iliac bone biopsy.  Positive for metastatic carcinoma.   07/21/2021 - 09/16/2021 Chemotherapy   Patient is on Treatment Plan : BREAST Paclitaxel D1,8,15 q28d     09/04/2021 Imaging   CT chest abdomen pelvis showed improvement of the pulmonary nodularity and thickening in the left and the right lung.  Persistent nodular interstitial and pleural thickening remains.  Improvement of mediastinal and hilar adenopathy, hepatic metastatic lesions are similar.  Adrenal gland metastasis is similar.  Interval  increase in the sclerotic bone metastasis.    09/30/2021 -  Chemotherapy   started on abemaciclib 100 mg twice daily and letrozole.   02/24/2022 Imaging    CT chest abdomen pelvis with contrast showed unchanged bilateral pleural effusion with associated intralobular septal thickening of the lung bases.  Without discrete nodularity consistent with stable appearance of treated pleural and lymphangitic metastatic disease.  Stable or minimally diminished subcentimeter liver lesions.  Unchanged left adrenal metastasis.  Unchanged widespread sclerotic osseous metastatic disease involving the included axial and appendicular skeleton.  No evidence of new metastatic disease in the chest abdomen or pelvis.  Coronary artery disease.   03/06/2022 Procedure   Ultrasound guided thoracentesis, removal of pleural fluid.    05/29/2022 Imaging   CT chest abdomen pelvis 1. Stable CT of the chest, abdomen and pelvis. 2. Unchanged appearance of bilateral pleural effusions, pleural nodularity, and interlobular septal thickening compatible with pleural and lymphangitic metastatic disease. 3. Stable appearance of multifocal sclerotic bone metastases. 4. Multifocal low-attenuation liver lesions are unchanged in the interval. 5. Stable appearance of left adrenal gland metastases. 6. Stable appearance of small pericardial effusion.7. No new or progressive disease identified.8. Aortic Atherosclerosis (ICD10-I70.0).   06/06/2022 Imaging   Bone scan Constellation of findings are consistent with  multifocal osseous metastatic disease throughout the axial and appendicular skeleton.Of note, osseous metastatic disease involves the RIGHT greater than LEFT femur. Consider evaluation with dedicated hip and femur radiographs to evaluate for overall disease burden and potential risk for pathologic fracture.   08/31/2022 Imaging   CT chest abdomen pelvis w contrast 1. Unchanged small right, trace left pleural effusions with associated  diffuse interlobular septal thickening and fine fissural and perilymphatic nodularity throughout the lungs, findings consistent with lymphangitic metastatic disease. 2. Stable or slightly diminished very subtle hypodense lesion of the central right lobe of the liver, hepatic segment VIII measuring 0.6 cm, previously 0.8 cm. Additional unchanged, very subtle hypodense lesion of hepatic segment IV B measuring 1.1 cm. 3. Unchanged widespread sclerotic osseous metastatic disease throughout the included axial and proximal appendicular skeleton, particularly dense in the lower lumbar spine and sacrum. 4. Unchanged left adrenal nodule measuring 1.7 x 1.1 cm. 5. Overall constellation of findings is consistent with stable metastatic disease. 6. Coronary artery disease.    12/01/2022 Miscellaneous   [facility] liquid biopsy - PIK3CA mutation. 0.1% Not enough tissue for tissue NGS   03/08/2023 Imaging   Bone scan showed Multifocal osseous metastatic disease. One new focus along the right side of the skull    Patient follows up with cardiology for evaluation of bradycardia.  Cardiology notes reviewed.  72-hour Holter monitor revealed revealed predominant sinus rhythm with mean heart rate of 96 bpm, heart rate range 53 to 129 bpm, frequent premature ventricular contractions (27% burden), and intermittent atrial fibrillation (5% burden) with longest episode 3 hours. CHA2DS2-VASc score of 3, per cardiology recommendation patient was started on Eliquis 5 mg twice daily.     INTERVAL HISTORY Amber Blevins is a 72 y.o. female who has above history reviewed by me today presents for follow up visit for metastatic breast cancer Occasional back and hip pain..  Denies any headache, nausea vomiting diarrhea.  Fever or chills. She reports feeling well.  She has no new complains.   Review of Systems  Constitutional:  Negative for appetite change, chills, fatigue and fever.  HENT:   Negative for hearing loss  and voice change.   Eyes:  Negative for eye problems.  Respiratory:  Negative for chest tightness, cough and shortness of breath.   Cardiovascular:  Negative for chest pain.  Gastrointestinal:  Negative for abdominal distention, abdominal pain and blood in stool.  Endocrine: Negative for hot flashes.  Genitourinary:  Negative for difficulty urinating and frequency.   Musculoskeletal:  Negative for arthralgias.  Skin:  Negative for itching and rash.  Neurological:  Positive for numbness. Negative for extremity weakness.  Hematological:  Negative for adenopathy.  Psychiatric/Behavioral:  Negative for confusion.      MEDICAL HISTORY:  Past Medical History:  Diagnosis Date   A-fib [facility])    Breast cancer (HCC)    COPD (chronic obstructive pulmonary disease) (HCC)    Family history of adverse reaction to anesthesia    brother has problem with coming out of anesthesia   High cholesterol    Hypertension    Personal history of radiation therapy    Pre-diabetes     SURGICAL HISTORY: Past Surgical History:  Procedure Laterality Date   BREAST LUMPECTOMY Left    2010   COLONOSCOPY     EYE SURGERY Left    Cataract surgery   PORTACATH PLACEMENT N/A 08/01/2021   Procedure: INSERTION PORT-A-CATH;  Surgeon: Carolan Shiver, MD;  Location: [facility] ORS;  Service: General;  Laterality: N/A;   thorocentesis Right 07/10/2021   and another on 07/25/21    SOCIAL HISTORY: Social History   Socioeconomic History   Marital status: Married    Spouse name: Not on file   Number of children: Not on file   Years of education: Not on file   Highest education level: Not on file  Occupational History   Not on file  Tobacco Use   Smoking status: Former    Current packs/day: 0.00    Average packs/day: 1 pack/day for 25.0 years (25.0 ttl pk-yrs)    Types: Cigarettes    Start date: 82    Quit date: 74    Years since quitting: 34.5   Smokeless tobacco: Never  Vaping Use   Vaping status:  Never Used  Substance and Sexual Activity   Alcohol use: Not Currently   Drug use: Never   Sexual activity: Not on file  Other Topics Concern   Not on file  Social History Narrative   Not on file   Social Determinants of Health   Financial Resource Strain: Low Risk  (06/08/2022)   Received from [facility]   Overall Financial Resource Strain (CARDIA)  Food Insecurity: No Food Insecurity (06/08/2022)   Received from [facility]   Hunger Vital Sign  Transportation Needs: No Transportation Needs (06/08/2022)   Received from [facility]   PRAPARE - Transportation  Physical Activity: Not on file  Stress: Not on file  Social Connections: Not on file  Intimate Partner Violence: Not on file    FAMILY HISTORY: Family History  Problem Relation Age of Onset   Cancer Mother        gynecological   Lung cancer Mother    Diabetes Father    Heart disease Father    Parkinson's disease Father    Brain cancer Brother    Bladder Cancer Brother    Pulmonary disease Brother    Rheumatic fever Brother    Breast cancer Neg Hx     ALLERGIES:  is allergic to green tea (camellia sinensis), melaleuca viridiflora, tea tree oil, and lisinopril.  MEDICATIONS:  Current Outpatient Medications  Medication Sig Dispense Refill   abemaciclib (VERZENIO) 100 MG tablet Take 1 tablet (100 mg total) by mouth 2 (two) times daily. Swallow tablets whole. Do not chew, crush, or split tablets before swallowing. 56 tablet 3   acetaminophen (TYLENOL) 650 MG CR tablet Take 650 mg by mouth every 8 (eight) hours as needed for pain.     atorvastatin (LIPITOR) 40 MG tablet Take 40 mg by mouth at bedtime.     Boswellia-Glucosamine-Vit D (OSTEO BI-FLEX-GLUCOS/5-LOXIN) TABS Take 1 capsule by mouth in the morning and at bedtime.     Calcium Carb-Cholecalciferol 600-400 MG-UNIT TABS Take 1 tablet by mouth daily.     cyanocobalamin 1000 MCG tablet Take 1,000 mcg by mouth  daily.     Docusate Sodium (DSS) 100 MG CAPS Take 1 capsule by mouth daily.     ELIQUIS 5 MG TABS tablet Take 5 mg by mouth 2 (two) times daily.     gabapentin (NEURONTIN) 300 MG capsule TAKE 2 CAPSULES BY MOUTH IN THE MORNING AND 3 CAPSULES IN THE EVENING 150 capsule 0   letrozole (FEMARA) 2.5 MG tablet Take 1 tablet (2.5 mg total) by mouth daily. 90 tablet 2   lidocaine-prilocaine (EMLA) cream Apply 1 application. topically as needed. 30 g 5   LORazepam (ATIVAN) 0.5 MG tablet  TAKE 1 TABLET BY MOUTH AT BEDTIME AS NEEDED FOR ANXIETY 30 tablet 0   losartan (COZAAR) 50 MG tablet Take 50 mg by mouth daily.     magnesium chloride (SLOW-MAG) 64 MG TBEC SR tablet Take 1 tablet by mouth at bedtime.     melatonin (MELATONIN MAXIMUM STRENGTH) 5 MG TABS Take 1 tablet by mouth at bedtime.     Turmeric (QC TUMERIC COMPLEX PO) Take 1 capsule by mouth at bedtime.     diphenoxylate-atropine (LOMOTIL) 2.5-0.025 MG tablet Take 1 tablet by mouth 4 (four) times daily as needed for diarrhea or loose stools. (Patient not taking: Reported on 12/01/2022) 30 tablet 0   Loperamide HCl (IMODIUM PO) Take by mouth. (Patient not taking: Reported on 12/01/2022)     ondansetron (ZOFRAN) 8 MG tablet Take by mouth every 8 (eight) hours as needed for nausea or vomiting. (Patient not taking: Reported on 11/02/2022)     prochlorperazine (COMPAZINE) 5 MG tablet Take 5 mg by mouth every 6 (six) hours as needed for nausea or vomiting. (Patient not taking: Reported on 12/01/2022)     No current facility-administered medications for this visit.   Facility-Administered Medications Ordered in Other Visits  Medication Dose Route Frequency Provider Last Rate Last Admin   sodium chloride flush (NS) 0.9 % injection 10 mL  10 mL Intravenous Once Rickard Patience, MD         PHYSICAL EXAMINATION: ECOG PERFORMANCE STATUS: 1 - Symptomatic but completely ambulatory Vitals:   04/26/23 0952  BP: 132/66  Pulse: 100  Resp: 18  Temp: (!) 97.1 F (36.2 C)   SpO2: 100%   Filed Weights   04/26/23 0952  Weight: 188 lb 4.8 oz (85.4 kg)    Physical Exam Constitutional:      General: She is not in acute distress.    Appearance: She is not diaphoretic.  HENT:     Head: Normocephalic and atraumatic.     Nose: Nose normal.     Mouth/Throat:     Pharynx: No oropharyngeal exudate.  Eyes:     General: No scleral icterus.    Pupils: Pupils are equal, round, and reactive to light.  Cardiovascular:     Rate and Rhythm: Normal rate and regular rhythm.     Heart sounds: No murmur heard. Pulmonary:     Effort: Pulmonary effort is normal. No respiratory distress.     Comments: Decreased breath sound bilaterally. Abdominal:     General: There is no distension.     Palpations: Abdomen is soft.     Tenderness: There is no abdominal tenderness.  Musculoskeletal:        General: Normal range of motion.     Cervical back: Normal range of motion and neck supple.  Skin:    General: Skin is warm and dry.     Findings: No erythema.  Neurological:     Mental Status: She is alert and oriented to person, place, and time.     Cranial Nerves: No cranial nerve deficit.     Motor: No abnormal muscle tone.     Coordination: Coordination normal.  Psychiatric:        Mood and Affect: Affect normal.       LABORATORY DATA:  I have reviewed the data as listed     Latest Ref Rng & Units 04/22/2023    9:01 AM 02/18/2023    9:00 AM 12/29/2022    8:55 AM  CBC  WBC 4.0 - 10.5 K/uL  4.0  4.2  4.2   Hemoglobin 12.0 - 15.0 g/dL 30.8  65.7  84.6   Hematocrit 36.0 - 46.0 % 33.1  34.2  33.7   Platelets 150 - 400 K/uL 174  187  197       Latest Ref Rng & Units 04/22/2023    9:01 AM 02/18/2023    9:00 AM 12/29/2022    8:55 AM  CMP  Glucose 70 - 99 mg/dL 962  952  841   BUN 8 - 23 mg/dL 20  16  19    Creatinine 0.44 - 1.00 mg/dL 3.24  4.01  0.27   Sodium 135 - 145 mmol/L 136  138  136   Potassium 3.5 - 5.1 mmol/L 3.8  3.9  3.7   Chloride 98 - 111 mmol/L 104   104  106   CO2 22 - 32 mmol/L 25  24  25    Calcium 8.9 - 10.3 mg/dL 9.1  9.2  8.9   Total Protein 6.5 - 8.1 g/dL 6.7  7.0  6.5   Total Bilirubin 0.3 - 1.2 mg/dL 0.6  1.1  0.8   Alkaline Phos 38 - 126 U/L 55  48  54   AST 15 - 41 U/L 31  27  30    ALT 0 - 44 U/L 20  16  17       RADIOGRAPHIC STUDIES: I have personally reviewed the radiological images as listed and agreed with the findings in the report. NM Bone Scan Whole Body  Result Date: 03/14/2023 CLINICAL DATA:  Breast cancer EXAM: NUCLEAR MEDICINE WHOLE BODY BONE SCAN TECHNIQUE: Whole body anterior and posterior images were obtained approximately 3 hours after intravenous injection of radiopharmaceutical. RADIOPHARMACEUTICALS:  21.58 mCi Technetium-3m MDP IV COMPARISON:  06/04/2022 FINDINGS: There is physiologic distribution of radiotracer along the kidneys and bladder. There are areas of degenerative uptake identified along the shoulders, knees, ankles and feet. However there are additional areas of uptake consistent with osseous metastatic disease. Areas involving the sternum, ribs, thoracolumbar spine, sacrum, right ischium, and femurs has a similar distribution to the prior examination. However there is a new hypermetabolic focus along the anterior aspect of the right side of the skull. IMPRESSION: Multifocal osseous metastatic disease. One new focus along the right side of the skull Electronically Signed   By: Karen Kays M.D.   On: 03/14/2023 10:45   CT CHEST ABDOMEN PELVIS W CONTRAST  Result Date: 02/23/2023 CLINICAL DATA:  Breast cancer follow-up with metastases. * Tracking Code: BO * EXAM: CT CHEST, ABDOMEN, AND PELVIS WITH CONTRAST TECHNIQUE: Multidetector CT imaging of the chest, abdomen and pelvis was performed following the standard protocol during bolus administration of intravenous contrast. RADIATION DOSE REDUCTION: This exam was performed according to the departmental dose-optimization program which includes automated  exposure control, adjustment of the mA and/or kV according to patient size and/or use of iterative reconstruction technique. CONTRAST:  OMNIPAQUE IOHEXOL 300 MG/ML  SOLN COMPARISON:  11/26/2022 and older FINDINGS: CT CHEST FINDINGS Cardiovascular: Right upper chest port. Port is accessed. Heart is nonenlarged but there is small pericardial effusion, stable from previous. Normal caliber thoracic aorta with some scattered vascular calcifications. Mediastinum/Nodes: Normal caliber thoracic esophagus. Thickening along the wall of the distal esophagus is less apparent today. Thyroid gland is unremarkable. No specific abnormal lymph node enlargement identified in the axillary region, hilum, chest wall, supraclavicular, internal mammary or mediastinum. Surgical clips in the left axillary region. Continued skin thickening along the left breast. Slight  spiculated area of tissue superiorly and laterally, unchanged on image 20 of series 2. Lungs/Pleura: Small right and tiny left pleural effusions are stable. There are some scattered areas of interstitial septal thickening bilaterally, likely scarring and fibrotic change. No consolidation or pneumothorax. No new dominant lung lesion. Musculoskeletal: Scattered sclerotic bone lesions are identified along the spine, clavicle, ribs, sternum. Extent and distribution by CT is similar to previous examination additional evaluation as clinically appropriate. Stable associated compression of T7 and T10. CT ABDOMEN PELVIS FINDINGS Hepatobiliary: Patent portal vein. Gallbladder is present. Mild focal thickening along the fundus of the gallbladder is stable and could be an area of adenomyomatosis. Previously there were some faint low-density lesions which are even further hardware seen today compared to prior. Example in segment 4A previously measured 6 mm, today has a vague low-density area measuring 5 mm on series 2, image 48. Focus in segment 4B which previously measured 12 mm,  today has poorly defined margins on series 2, image 54 and is estimated in diameter at 10 mm. No new liver lesions identified. Patent portal vein. Pancreas: Unremarkable. No pancreatic ductal dilatation or surrounding inflammatory changes. Spleen: Normal in size without focal abnormality. Adrenals/Urinary Tract: Right adrenal gland is preserved. There is stable left adrenal nodule which previously measured 18 x 13 mm and today on series 2, image 55 measures 18 x 12 mm. No enhancing renal mass. Multiple bilateral parapelvic renal cysts. Preserved contours of the urinary bladder. Stomach/Bowel: Diffuse colonic stool. Moderate stool burden. Scattered left-sided colonic diverticula. Normal appendix. Stomach is mildly distended with fluid. Small bowel is nondilated. Vascular/Lymphatic: Moderate vascular calcifications. Normal caliber aorta and IVC. No specific abnormal lymph node enlargement identified in the abdomen and pelvis. Reproductive: Uterus and bilateral adnexa are unremarkable. Other: No free air or free fluid. There is some central mesenteric haziness but unchanged from previous examination. Musculoskeletal: Scattered bony is areas of sclerosis again identified consistent with known osseous metastatic disease. Diffuse distribution involving pelvis, femurs and spine. By CT distribution appears similar. Additional evaluation as clinically appropriate IMPRESSION: Overall no significant interval change. Stable sclerotic bone metastases. Small liver lesions are becoming more faint.  No new liver lesions. Stable small adrenal nodules. No developing new mass lesion, fluid collection or lymph node enlargement. Stable central mesenteric haziness. Moderate stool with scattered colonic diverticula. Stable small right and tiny left pleural effusion Electronically Signed   By: Karen Kays M.D.   On: 02/23/2023 10:17

## 2023-04-26 NOTE — Assessment & Plan Note (Signed)
#  Grade 2 neuropathy, continue gabapentin,  Continue gabapentin 600 mg in a.m., 900 mg at bedtime.  

## 2023-04-26 NOTE — Assessment & Plan Note (Addendum)
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 Continue Abemaciclib 100 mg twice daily plus letrozole 2.5mg  daily. 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   Repeat MRI brain- stable. - new frontal skull lesion, asymptomatic,  Stable tumor markers, repeat CT before next visit

## 2023-04-26 NOTE — Assessment & Plan Note (Signed)
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 Zometa Q 8 weeks.  Continue calcium and vitamin D supplementation. .  Repeat bone scan-result reviewed.

## 2023-04-26 NOTE — Assessment & Plan Note (Signed)
Stable on recent CT.

## 2023-04-26 NOTE — Assessment & Plan Note (Signed)
Chemotherapy plan as listed above 

## 2023-04-26 NOTE — Assessment & Plan Note (Signed)
Intermittent A-fib, now on Eliquis 5 mg twice daily. Abemaciclib is associated with <5% risk of A-fib. Patient has increased risk of bleeding.  Close monitor.   Continue follow-up with cardiology.

## 2023-04-27 ENCOUNTER — Encounter: Payer: Self-pay | Admitting: Oncology

## 2023-04-27 ENCOUNTER — Other Ambulatory Visit: Payer: Self-pay | Admitting: Oncology

## 2023-05-13 ENCOUNTER — Other Ambulatory Visit: Payer: Self-pay | Admitting: Oncology

## 2023-05-14 ENCOUNTER — Ambulatory Visit
Admission: EM | Admit: 2023-05-14 | Discharge: 2023-05-14 | Disposition: A | Discharge status: Home / Self Care | Payer: Medicare PPO | Attending: Physician Assistant | Admitting: Physician Assistant

## 2023-05-14 ENCOUNTER — Encounter: Payer: Self-pay | Admitting: Emergency Medicine

## 2023-05-14 ENCOUNTER — Encounter: Payer: Self-pay | Admitting: Oncology

## 2023-05-14 ENCOUNTER — Ambulatory Visit (INDEPENDENT_AMBULATORY_CARE_PROVIDER_SITE_OTHER): Payer: Medicare PPO

## 2023-05-14 DIAGNOSIS — R051 Acute cough: Secondary | ICD-10-CM | POA: Diagnosis present

## 2023-05-14 DIAGNOSIS — U071 COVID-19: Secondary | ICD-10-CM | POA: Diagnosis not present

## 2023-05-14 DIAGNOSIS — C801 Malignant (primary) neoplasm, unspecified: Secondary | ICD-10-CM | POA: Diagnosis present

## 2023-05-14 DIAGNOSIS — R059 Cough, unspecified: Secondary | ICD-10-CM | POA: Diagnosis not present

## 2023-05-14 DIAGNOSIS — R509 Fever, unspecified: Secondary | ICD-10-CM | POA: Diagnosis present

## 2023-05-14 LAB — SARS CORONAVIRUS 2 BY RT PCR: SARS Coronavirus 2 by RT PCR: POSITIVE — AB

## 2023-05-14 LAB — GROUP A STREP BY PCR: Group A Strep by PCR: NOT DETECTED

## 2023-05-14 MED ORDER — MOLNUPIRAVIR 200 MG PO CAPS: 4.0000 | ORAL_CAPSULE | Freq: Two times a day (BID) | ORAL | 0 refills | Status: AC

## 2023-05-14 MED ORDER — PROMETHAZINE-DM 6.25-15 MG/5ML PO SYRP: 5.0000 mL | ORAL_SOLUTION | Freq: Four times a day (QID) | ORAL | 0 refills | Status: DC | PRN

## 2023-05-14 NOTE — ED Provider Notes (Signed)
[facility]    CSN: [id number] Arrival date & time: 05/14/23  1344      History   Chief Complaint Chief Complaint  Patient presents with   Cough    Room 2   Generalized Body Aches   Otalgia     HPI Amber Blevins is a 72 y.o. female presenting for low-grade fever, fatigue, cough, congestion, runny nose, body aches x 2 to 3 days.  She denies high-grade fever, chest pain, or increased shortness of breath from baseline.  Denies any sick contacts or known COVID exposure.  Took a COVID test at home and it was negative, but does report that it was expired.  Medical history significant for breast cancer with mets, COPD, a-fib, hypertension, hyperlipidemia, PVCs, bilateral pleural effusions, and osteopenia.  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   Metastasis to bone (HCC) 02/04/2022   Chemotherapy induced diarrhea 10/07/2021   Port-A-Cath in place 10/07/2021   Encounter for antineoplastic chemotherapy 08/04/2021   Primary malignant neoplasm of breast with metastasis (HCC) 07/17/2021   Acute respiratory failure with hypoxia (HCC) 07/12/2021   Bilateral pleural effusion 07/12/2021   Metastasis to bone of unknown primary (HCC) 07/12/2021   Mediastinal lymphadenopathy 07/12/2021   Aortic atherosclerosis (HCC) 07/12/2021   Bone lesion 07/02/2021   Neoplasm related pain 07/02/2021    Lung nodules 07/02/2021   History of breast cancer 07/02/2021   Osteopenia of left forearm 06/10/2021   Osteopenia of spine 06/10/2021   Hyperkalemia 06/07/2020   Hip pain, bilateral 03/21/2019   Prediabetes 03/16/2019   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      Home Medications    Prior to Admission medications   Medication Sig Start Date End Date Taking? Authorizing Provider  molnupiravir EUA (LAGEVRIO) 200 MG CAPS capsule Take 4 capsules (800 mg total) by mouth 2 (two) times daily for 5 days. 05/14/23 05/19/23 Yes Shirlee Latch, PA-C  promethazine-dextromethorphan (PROMETHAZINE-DM) 6.25-15 MG/5ML syrup Take 5 mLs by mouth 4 (four) times daily as needed. 05/14/23  Yes Shirlee Latch, PA-C  abemaciclib (VERZENIO) 100 MG tablet Take 1 tablet (100 mg total) by mouth 2 (two) times daily. Swallow tablets whole. Do not chew, crush, or split tablets before swallowing. 02/23/23   Rickard Patience,  MD  acetaminophen (TYLENOL) 650 MG CR tablet Take 650 mg by mouth every 8 (eight) hours as needed for pain.    [provider]  atorvastatin (LIPITOR) 40 MG tablet Take 40 mg by mouth at bedtime. 11/25/20   [provider]  Boswellia-Glucosamine-Vit D (OSTEO BI-FLEX-GLUCOS/5-LOXIN) TABS Take 1 capsule by mouth in the morning and at bedtime.    [provider]  Calcium Carb-Cholecalciferol 600-400 MG-UNIT TABS Take 1 tablet by mouth  daily.    [provider]  cyanocobalamin 1000 MCG tablet Take 1,000 mcg by mouth daily.    [provider]  diphenoxylate-atropine (LOMOTIL) 2.5-0.025 MG tablet Take 1 tablet by mouth 4 (four) times daily as needed for diarrhea or loose stools. Patient not taking: Reported on 12/01/2022 10/05/21   Jeralyn Ruths, MD  Docusate Sodium (DSS) 100 MG CAPS Take 1 capsule by mouth daily.    [provider]  ELIQUIS 5 MG TABS tablet Take 5 mg by mouth 2 (two) times daily. 06/02/22   [provider]  gabapentin (NEURONTIN) 300 MG capsule TAKE 2 CAPSULES BY MOUTH IN THE MORNING AND 3 IN THE EVENING 05/14/23   Rickard Patience, MD  letrozole [facility]) 2.5 MG tablet Take 1 tablet (2.5 mg total) by mouth daily. 11/02/22   Rickard Patience, MD  lidocaine-prilocaine (EMLA) cream Apply small amount to port and cover with saran wrap 1-2 hours prior to port access 04/27/23   Rickard Patience, MD  Loperamide HCl (IMODIUM PO) Take by mouth. Patient not taking: Reported on 12/01/2022    [provider]  LORazepam (ATIVAN) 0.5 MG tablet TAKE 1 TABLET BY MOUTH AT BEDTIME AS NEEDED FOR ANXIETY 12/01/21   Rickard Patience, MD  losartan (COZAAR) 50 MG tablet Take 50 mg by mouth daily. 03/03/22   [provider]  magnesium chloride (SLOW-MAG) 64 MG TBEC SR tablet Take 1 tablet by mouth at bedtime.    [provider]  melatonin (MELATONIN MAXIMUM STRENGTH) 5 MG TABS Take 1 tablet by mouth at bedtime.    [provider]  ondansetron (ZOFRAN) 8 MG tablet Take by mouth every 8 (eight) hours as needed for nausea or vomiting. Patient not taking: Reported on 11/02/2022    [provider]  prochlorperazine (COMPAZINE) 5 MG tablet Take 5 mg by mouth every 6 (six) hours as needed for nausea or vomiting. Patient not taking: Reported on 12/01/2022    [provider]  Turmeric (QC TUMERIC COMPLEX PO) Take 1 capsule by mouth at bedtime.    [provider]    Family History Family  History  Problem Relation Age of Onset   Cancer Mother        gynecological   Lung cancer Mother    Diabetes Father    Heart disease Father    Parkinson's disease Father    Brain cancer Brother    Bladder Cancer Brother    Pulmonary disease Brother    Rheumatic fever Brother    Breast cancer Neg Hx     Social History Social History   Tobacco Use   Smoking status: Former    Current packs/day: 0.00    Average packs/day: 1 pack/day for 25.0 years (25.0 ttl pk-yrs)    Types: Cigarettes    Start date: 44    Quit date: 1990    Years since quitting: 34.6   Smokeless tobacco: Never  Vaping Use   Vaping status: Never Used  Substance Use Topics   Alcohol use:  Not Currently   Drug use: Never     Allergies   Green tea (camellia sinensis), Melaleuca viridiflora, Tea tree oil, and Lisinopril   Review of Systems Review of Systems  Constitutional:  Positive for fatigue and fever. Negative for chills and diaphoresis.  HENT:  Positive for congestion, ear pain, rhinorrhea and sore throat. Negative for sinus pressure and sinus pain.   Respiratory:  Positive for cough and shortness of breath. Negative for chest tightness.   Cardiovascular:  Negative for chest pain.  Gastrointestinal:  Negative for abdominal pain, nausea and vomiting.  Musculoskeletal:  Positive for myalgias. Negative for arthralgias.  Skin:  Negative for rash.  Neurological:  Positive for headaches. Negative for weakness.  Hematological:  Negative for adenopathy.     Physical Exam Triage Vital Signs ED Triage Vitals  Enc Vitals Group     BP 07/11/21 1957 (!) 160/79     Pulse Rate 07/11/21 1957 94     Resp 07/11/21 1957 (!) 22     Temp 07/11/21 1957 98.5 F (36.9 C)     Temp Source 07/11/21 1957 Oral     SpO2 07/11/21 1957 94 %     Weight 07/11/21 1958 180 lb (81.6 kg)     Height 07/11/21 1958 5\' 5"  (1.651 m)     Head Circumference --      Peak Flow --      Pain Score 07/11/21 1958 0     Pain Loc --       Pain Edu? --      Excl. in GC? --    No data found.  Updated Vital Signs BP 136/83 (BP Location: Right Arm)   Pulse (!) 119   Temp 100.3 F (37.9 C) (Oral)   Resp 14   Ht 5\' 4"  (1.626 m)   Wt 188 lb 4.4 oz (85.4 kg)   SpO2 95%   BMI 32.32 kg/m      Physical Exam Vitals and nursing note reviewed.  Constitutional:      General: She is not in acute distress.    Appearance: Normal appearance. She is not ill-appearing or toxic-appearing.  HENT:     Head: Normocephalic and atraumatic.     Right Ear: Tympanic membrane, ear canal and external ear normal.     Left Ear: Tympanic membrane, ear canal and external ear normal.     Nose: Congestion present.     Mouth/Throat:     Mouth: Mucous membranes are moist.     Pharynx: Oropharynx is clear. Posterior oropharyngeal erythema present.  Eyes:     General: No scleral icterus.       Right eye: No discharge.        Left eye: No discharge.     Conjunctiva/sclera: Conjunctivae normal.  Cardiovascular:     Rate and Rhythm: Regular rhythm. Tachycardia present.     Heart sounds: Normal heart sounds.  Pulmonary:     Effort: Pulmonary effort is normal. Tachypnea present. No respiratory distress.     Breath sounds: Normal breath sounds. Decreased air movement (decreased throughout) present. No wheezing, rhonchi or rales.  Musculoskeletal:     Cervical back: Neck supple.  Skin:    General: Skin is dry.  Neurological:     General: No focal deficit present.     Mental Status: She is alert. Mental status is at baseline.     Motor: No weakness.     Gait: Gait normal.  Psychiatric:  Mood and Affect: Mood normal.        Behavior: Behavior normal.        Thought Content: Thought content normal.      UC Treatments / Results  Labs (all labs ordered are listed, but only abnormal results are displayed) Labs Reviewed  SARS CORONAVIRUS 2 BY RT PCR - Abnormal; Notable for the following components:      Result Value   SARS  Coronavirus 2 by RT PCR POSITIVE (*)    All other components within normal limits  GROUP A STREP BY PCR    EKG   Radiology DG Chest 2 View  Result Date: 05/14/2023 CLINICAL DATA:  Cough, headache, nasal congestion, runny nose EXAM: CHEST - 2 VIEW COMPARISON:  Chest radiograph 12/29/2022 FINDINGS: The right chest wall port is stable The cardiomediastinal silhouette is stable There is no focal consolidation or pulmonary edema. Blunting of the costophrenic angles is unchanged, favored to reflect scarring. There is no convincing pleural effusion. There is no pneumothorax There is no acute osseous abnormality. Mild compression deformity of mid and lower thoracic vertebral bodies is unchanged. IMPRESSION: Stable chest with no radiographic evidence of acute cardiopulmonary process. Electronically Signed   By: Lesia Hausen M.D.   On: 05/14/2023 15:39    Procedures Procedures (including critical care time)  Medications Ordered in UC Medications - No data to display  Initial Impression / Assessment and Plan / UC Course  I have reviewed the triage vital signs and the nursing notes.  Pertinent labs & imaging results that were available during my care of the patient were reviewed by me and considered in my medical decision making (see chart for details).  72 year old female presenting for increased cough, congestion, headaches, body aches, and runny nose x 3 days. Patient does have history of pulmonary nodules, pleural effusions, COPD and mets to lungs and bone from breast cancer.    Patient's resting oxygen saturation is 95%. Pulse up to 119 bpm.  She actually appears overall well.  She is in no acute distress.  Mild nasal congestion and erythema posterior pharynx.  Chest clear.  Positive COVID test. No acute abnormalities of CXR.  Reviewed results with patient and husband.  Reviewed current CDC guidelines, isolation protocol and ED precautions.  Sent molnupiravir since she has contraindication to  Paxlovid.  Also sent Promethazine DM.  Encouraged increasing rest and fluids.  Thoroughly reviewed ED precautions and advised her to inform her oncologist that she currently has COVID and ask if there are any other recommendations.   Final Clinical Impressions(s) / UC Diagnoses   Final diagnoses:  COVID-19  Acute cough  Fever, unspecified  Cancer (HCC)     Discharge Instructions      -Positive COVID test.  Need to isolate until he been fever free for 24 hours and your symptoms are improving.  As long as you are coughing wear a mask.  This will be about a week or so. - Increase rest and fluids.  I sent an antiviral medication to the pharmacy and cough medicine. - If you have any uncontrollable fever, weakness or increased shortness of breath from baseline please go to the ER. - Inform your oncologist that you have COVID and see if there are any other recommendations.      ED Prescriptions     Medication Sig Dispense Auth. Provider   promethazine-dextromethorphan (PROMETHAZINE-DM) 6.25-15 MG/5ML syrup Take 5 mLs by mouth 4 (four) times daily as needed. 118 mL Shirlee Latch,  PA-C   molnupiravir EUA (LAGEVRIO) 200 MG CAPS capsule Take 4 capsules (800 mg total) by mouth 2 (two) times daily for 5 days. 40 capsule Shirlee Latch, PA-C      PDMP not reviewed this encounter.      Shirlee Latch, PA-C 05/14/23 1559

## 2023-05-14 NOTE — ED Triage Notes (Signed)
Patient c/o cough, headache, nasal congestion, runny nose, bodyaches that started 3 days ago.  Patient reports fevers.

## 2023-05-14 NOTE — Discharge Instructions (Addendum)
-  Positive COVID test.  Need to isolate until he been fever free for 24 hours and your symptoms are improving.  As long as you are coughing wear a mask.  This will be about a week or so. - Increase rest and fluids.  I sent an antiviral medication to the pharmacy and cough medicine. - If you have any uncontrollable fever, weakness or increased shortness of breath from baseline please go to the ER. - Inform your oncologist that you have COVID and see if there are any other recommendations.

## 2023-05-21 ENCOUNTER — Other Ambulatory Visit: Payer: Medicare PPO

## 2023-06-09 ENCOUNTER — Other Ambulatory Visit: Payer: Self-pay | Admitting: Oncology

## 2023-06-16 ENCOUNTER — Other Ambulatory Visit: Payer: Self-pay | Admitting: Pharmacist

## 2023-06-16 DIAGNOSIS — C50919 Malignant neoplasm of unspecified site of unspecified female breast: Secondary | ICD-10-CM

## 2023-06-16 MED ORDER — ABEMACICLIB 100 MG PO TABS
100.0000 mg | ORAL_TABLET | Freq: Two times a day (BID) | ORAL | 3 refills | Status: AC
Start: 2023-06-16 — End: ?

## 2023-06-21 ENCOUNTER — Ambulatory Visit
Admission: RE | Admit: 2023-06-21 | Discharge: 2023-06-21 | Disposition: A | Discharge status: Home / Self Care | Payer: Medicare PPO | Source: Ambulatory Visit | Attending: Oncology | Admitting: Oncology

## 2023-06-21 ENCOUNTER — Inpatient Hospital Stay: Payer: Medicare PPO

## 2023-06-21 ENCOUNTER — Inpatient Hospital Stay: Payer: Medicare PPO | Attending: Oncology

## 2023-06-21 VITALS — BP 136/71 | HR 107 | Temp 97.0°F | Resp 18

## 2023-06-21 DIAGNOSIS — Z79811 Long term (current) use of aromatase inhibitors: Secondary | ICD-10-CM | POA: Diagnosis not present

## 2023-06-21 DIAGNOSIS — Z170 Estrogen receptor positive status [ER+]: Secondary | ICD-10-CM | POA: Insufficient documentation

## 2023-06-21 DIAGNOSIS — C7951 Secondary malignant neoplasm of bone: Secondary | ICD-10-CM | POA: Insufficient documentation

## 2023-06-21 DIAGNOSIS — C50919 Malignant neoplasm of unspecified site of unspecified female breast: Secondary | ICD-10-CM | POA: Diagnosis present

## 2023-06-21 DIAGNOSIS — Z23 Encounter for immunization: Secondary | ICD-10-CM | POA: Insufficient documentation

## 2023-06-21 DIAGNOSIS — I2510 Atherosclerotic heart disease of native coronary artery without angina pectoris: Secondary | ICD-10-CM | POA: Diagnosis not present

## 2023-06-21 DIAGNOSIS — G620 Drug-induced polyneuropathy: Secondary | ICD-10-CM | POA: Insufficient documentation

## 2023-06-21 DIAGNOSIS — I3139 Other pericardial effusion (noninflammatory): Secondary | ICD-10-CM | POA: Insufficient documentation

## 2023-06-21 DIAGNOSIS — C787 Secondary malignant neoplasm of liver and intrahepatic bile duct: Secondary | ICD-10-CM | POA: Diagnosis not present

## 2023-06-21 DIAGNOSIS — Z7901 Long term (current) use of anticoagulants: Secondary | ICD-10-CM | POA: Diagnosis not present

## 2023-06-21 DIAGNOSIS — T451X5A Adverse effect of antineoplastic and immunosuppressive drugs, initial encounter: Secondary | ICD-10-CM | POA: Diagnosis not present

## 2023-06-21 DIAGNOSIS — I700 Atherosclerosis of aorta: Secondary | ICD-10-CM | POA: Insufficient documentation

## 2023-06-21 DIAGNOSIS — R160 Hepatomegaly, not elsewhere classified: Secondary | ICD-10-CM | POA: Insufficient documentation

## 2023-06-21 DIAGNOSIS — I480 Paroxysmal atrial fibrillation: Secondary | ICD-10-CM | POA: Diagnosis not present

## 2023-06-21 LAB — CMP (CANCER CENTER ONLY)
ALT: 19 U/L (ref 0–44)
AST: 25 U/L (ref 15–41)
Albumin: 3.6 g/dL (ref 3.5–5.0)
Alkaline Phosphatase: 52 U/L (ref 38–126)
Anion gap: 9 (ref 5–15)
BUN: 17 mg/dL (ref 8–23)
CO2: 24 mmol/L (ref 22–32)
Calcium: 8.6 mg/dL — ABNORMAL LOW (ref 8.9–10.3)
Chloride: 105 mmol/L (ref 98–111)
Creatinine: 1.11 mg/dL — ABNORMAL HIGH (ref 0.44–1.00)
GFR, Estimated: 52 mL/min — ABNORMAL LOW (ref >60)
Glucose, Bld: 147 mg/dL — ABNORMAL HIGH (ref 70–99)
Potassium: 3.5 mmol/L (ref 3.5–5.1)
Sodium: 138 mmol/L (ref 135–145)
Total Bilirubin: 0.8 mg/dL (ref 0.3–1.2)
Total Protein: 6.4 g/dL — ABNORMAL LOW (ref 6.5–8.1)

## 2023-06-21 LAB — CBC WITH DIFFERENTIAL (CANCER CENTER ONLY)
Abs Immature Granulocytes: 0.02 10*3/uL (ref 0.00–0.07)
Basophils Absolute: 0 10*3/uL (ref 0.0–0.1)
Basophils Relative: 1 %
Eosinophils Absolute: 0.1 10*3/uL (ref 0.0–0.5)
Eosinophils Relative: 2 %
HCT: 33.1 % — ABNORMAL LOW (ref 36.0–46.0)
Hemoglobin: 10.6 g/dL — ABNORMAL LOW (ref 12.0–15.0)
Immature Granulocytes: 0 %
Lymphocytes Relative: 16 %
Lymphs Abs: 0.7 10*3/uL (ref 0.7–4.0)
MCH: 31.2 pg (ref 26.0–34.0)
MCHC: 32 g/dL (ref 30.0–36.0)
MCV: 97.4 fL (ref 80.0–100.0)
Monocytes Absolute: 0.4 10*3/uL (ref 0.1–1.0)
Monocytes Relative: 9 %
Neutro Abs: 3.3 10*3/uL (ref 1.7–7.7)
Neutrophils Relative %: 72 %
Platelet Count: 185 10*3/uL (ref 150–400)
RBC: 3.4 MIL/uL — ABNORMAL LOW (ref 3.87–5.11)
RDW: 15 % (ref 11.5–15.5)
WBC Count: 4.5 10*3/uL (ref 4.0–10.5)
nRBC: 0 % (ref 0.0–0.2)

## 2023-06-21 LAB — POCT I-STAT CREATININE: Creatinine, Ser: 1.1 mg/dL — ABNORMAL HIGH (ref 0.44–1.00)

## 2023-06-21 MED ORDER — IOHEXOL 300 MG/ML  SOLN
100.0000 mL | Freq: Once | INTRAMUSCULAR | Status: AC | PRN
  Administered 2023-06-21: 100 mL via INTRAVENOUS

## 2023-06-21 MED ORDER — SODIUM CHLORIDE 0.9% FLUSH
10.0000 mL | Freq: Once | INTRAVENOUS | Status: AC | PRN
  Administered 2023-06-21: 10 mL
  Filled 2023-06-21: qty 10

## 2023-06-21 MED ORDER — ZOLEDRONIC ACID 4 MG/100ML IV SOLN
4.0000 mg | Freq: Once | INTRAVENOUS | Status: AC
  Administered 2023-06-21: 4 mg via INTRAVENOUS
  Filled 2023-06-21: qty 100

## 2023-06-21 MED ORDER — HEPARIN SOD (PORK) LOCK FLUSH 100 UNIT/ML IV SOLN
250.0000 [IU] | Freq: Once | INTRAVENOUS | Status: AC | PRN
  Administered 2023-06-21: 500 [IU]
  Filled 2023-06-21: qty 5

## 2023-06-21 MED ORDER — SODIUM CHLORIDE 0.9 % IV SOLN
Freq: Once | INTRAVENOUS | Status: AC
  Filled 2023-06-21: qty 250

## 2023-06-21 NOTE — Patient Instructions (Signed)

## 2023-06-21 NOTE — Progress Notes (Signed)
Patient tolerated Zometa infusion well, no questions/concerns voiced. Patient stable at discharge. AVS given.

## 2023-06-22 LAB — CANCER ANTIGEN 15-3: CA 15-3: 45.4 U/mL — ABNORMAL HIGH (ref 0.0–25.0)

## 2023-06-22 LAB — CANCER ANTIGEN 27.29: CA 27.29: 53.6 U/mL — ABNORMAL HIGH (ref 0.0–38.6)

## 2023-06-25 ENCOUNTER — Other Ambulatory Visit: Payer: Self-pay | Admitting: Pharmacist

## 2023-06-25 ENCOUNTER — Inpatient Hospital Stay: Payer: Medicare PPO

## 2023-06-25 ENCOUNTER — Inpatient Hospital Stay: Payer: Medicare PPO | Admitting: Oncology

## 2023-06-25 ENCOUNTER — Encounter: Payer: Self-pay | Admitting: Oncology

## 2023-06-25 VITALS — BP 118/69 | HR 98 | Temp 97.2°F | Resp 18 | Wt 191.4 lb

## 2023-06-25 DIAGNOSIS — Z5111 Encounter for antineoplastic chemotherapy: Secondary | ICD-10-CM

## 2023-06-25 DIAGNOSIS — I4891 Unspecified atrial fibrillation: Secondary | ICD-10-CM

## 2023-06-25 DIAGNOSIS — C50919 Malignant neoplasm of unspecified site of unspecified female breast: Secondary | ICD-10-CM

## 2023-06-25 DIAGNOSIS — C7951 Secondary malignant neoplasm of bone: Secondary | ICD-10-CM | POA: Diagnosis not present

## 2023-06-25 DIAGNOSIS — G620 Drug-induced polyneuropathy: Secondary | ICD-10-CM | POA: Diagnosis not present

## 2023-06-25 DIAGNOSIS — Z23 Encounter for immunization: Secondary | ICD-10-CM

## 2023-06-25 DIAGNOSIS — T451X5A Adverse effect of antineoplastic and immunosuppressive drugs, initial encounter: Secondary | ICD-10-CM

## 2023-06-25 MED ORDER — INFLUENZA VAC A&B SURF ANT ADJ 0.5 ML IM SUSY
0.5000 mL | PREFILLED_SYRINGE | Freq: Once | INTRAMUSCULAR | Status: AC
  Administered 2023-06-25: 0.5 mL via INTRAMUSCULAR
  Filled 2023-06-25: qty 0.5

## 2023-06-25 MED ORDER — ABEMACICLIB 150 MG PO TABS
150.0000 mg | ORAL_TABLET | Freq: Two times a day (BID) | ORAL | 0 refills | Status: DC
Start: 2023-06-25 — End: 2023-07-20

## 2023-06-25 MED ORDER — INFLUENZA VAC A&B SA ADJ QUAD 0.5 ML IM PRSY
0.5000 mL | PREFILLED_SYRINGE | Freq: Once | INTRAMUSCULAR | Status: DC
  Filled 2023-06-25: qty 0.5

## 2023-06-25 NOTE — Assessment & Plan Note (Signed)
Chemotherapy plan as listed above 

## 2023-06-25 NOTE — Assessment & Plan Note (Signed)
#  Grade 2 neuropathy, continue gabapentin,  Continue gabapentin 600 mg in a.m., 900 mg at bedtime.  

## 2023-06-25 NOTE — Assessment & Plan Note (Addendum)
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   Repeat MRI brain-  CT results reviewed and discussed with patient.  Stable radiographic disease.  Tumor marker is gradually increasing. Recommend patient to increase abemaciclib 150 mg twice daily plus letrozole 2.5mg  daily.  Rationale and potential risks were reviewed and discussed with patient.  She agrees with the plan.

## 2023-06-25 NOTE — Progress Notes (Signed)
Hematology/Oncology Progress note Telephone:([phone] Fax:([phone]) [phone]     CHIEF COMPLAINTS/REASON FOR VISIT:  metastatic breast caner ASSESSMENT & PLAN:   Cancer Staging  Primary malignant neoplasm of breast with metastasis (HCC) Staging form: Breast, AJCC 8th Edition - Clinical stage from 07/12/2021: Stage IV (rcTX, cNX, pM1, ER+, PR+, HER2-) - Signed by Rickard Patience, MD on 03/08/2022   Primary malignant neoplasm of breast with metastasis (HCC) Metastatic Breast cancer with extensive thoracic and bone involvement, in visceral crisis, ER+, PR+ HER2 neg Labs reviewed and discussed with patient. 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   Repeat MRI brain-  CT results reviewed and discussed with patient.  Stable radiographic disease.  Tumor marker is gradually increasing. Recommend patient to increase abemaciclib 150 mg twice daily plus letrozole 2.5mg  daily.  Rationale and potential risks were reviewed and discussed with patient.  She agrees with the plan.   Encounter for antineoplastic chemotherapy Chemotherapy plan as listed above   Chemotherapy-induced neuropathy (HCC) #Grade 2 neuropathy, continue gabapentin,  Continue gabapentin 600 mg in a.m., 900 mg at bedtime.   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 Continue calcium and vitamin D supplementation. .  Repeat bone scan-result reviewed.   A-fib (HCC) Intermittent A-fib, now on Eliquis 5 mg twice daily. Abemaciclib is associated with <5% risk of A-fib. Patient has increased risk of bleeding.  Close monitor.   Continue follow-up with cardiology.  Need for prophylactic vaccination and inoculation against influenza Influenza vaccination today     Orders Placed This Encounter  Procedures   MR Brain W  Wo Contrast    Standing Status:   Future    Standing Expiration Date:   06/24/2024    Order Specific Question:   If indicated for the ordered procedure, I authorize the administration of contrast media per Radiology protocol    Answer:   Yes    Order Specific Question:   What is the patient's sedation requirement?    Answer:   No Sedation    Order Specific Question:   Does the patient have a pacemaker or implanted devices?    Answer:   No    Order Specific Question:   Use SRS Protocol?    Answer:   No    Order Specific Question:   Preferred imaging location?    Answer:   [facility] (table limit - 550lbs)   CBC with Differential (Cancer Center Only)    Standing Status:   Future    Standing Expiration Date:   06/24/2024   CMP (Cancer Center only)    Standing Status:   Future    Standing Expiration Date:   06/24/2024   Iron and TIBC    Standing Status:   Future    Standing Expiration Date:   06/24/2024   Ferritin    Standing Status:   Future    Standing Expiration Date:   06/24/2024   Retic Panel    Standing Status:   Future    Standing Expiration Date:   06/24/2024   Cancer antigen 15-3    Standing Status:   Future    Standing Expiration Date:   06/24/2024   Cancer antigen 27.29    Standing Status:   Future    Standing Expiration Date:   06/24/2024    Follow up 4 weeks  All questions were answered. The patient knows to call the clinic with any problems, questions or concerns.  Rickard Patience, MD, PhD [facility] Hematology Oncology 06/25/2023      HISTORY OF PRESENTING ILLNESS:   Amber Blevins is a  72 y.o.  female with PMH listed below presents for follow up for treatment of metastatic breast cancer.   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 Additional metastatic involvement T12, L1 through L3.  No primary tumor site identified. Top differential considerations are Metastatic Disease Unknown primary, less likely Lymphoma or Multiple Myeloma.   2. Superimposed lumbar spine degeneration, including degenerative moderate to severe left L3 and L4 nerve level impingement from disc herniation.      07/02/2021 Imaging   CT chest abdomen pelvis showed innumerable small pulmonary and pleural nodules consistent with diffuse metastasis.  Associated with probable malignant pleural effusion.  Mediastinal and hilar lymphadenopathy consistent with metastatic disease.  Hepatic and bilateral adrenal gland metastasis.  Diffuse extensive destructive metastatic bone disease involving pelvis.   07/12/2021 Cancer Staging   Staging form: Breast, AJCC 8th Edition - Clinical stage from 07/12/2021: Stage IV (rcTX, cNX, pM1, ER+, PR+, HER2-) - Signed by Rickard Patience, MD on 03/08/2022 Stage prefix: Recurrence   07/17/2021 Initial Diagnosis   Metastatic breast cancer -history of breast cancer, diagnosed in 2010 Left-sided T2 (2.4cm) N0, ER/PR positive, HER2 negative IDC of the breast, s/p lumpectomy by Dr.Meyer at [facility]. Oncotype score of 11 who completed radiation and she finished 10 years of Femara from 05/2009 and stopped in 2020.  -07/12/2021 patient underwent right thoracentesis.  Cytology was positive for metastatic carcinoma, compatible with breast origin.  ER/PR +, HER2 negative -07/16/2021, patient underwent iliac bone biopsy.  Positive for metastatic carcinoma.   07/21/2021 -  09/16/2021 Chemotherapy   Patient is on Treatment Plan : BREAST Paclitaxel D1,8,15 q28d     09/04/2021 Imaging   CT chest abdomen pelvis showed improvement of the pulmonary nodularity and thickening in the left and the right lung.  Persistent nodular interstitial and pleural thickening remains.  Improvement of mediastinal and hilar adenopathy, hepatic metastatic lesions are similar.  Adrenal gland metastasis is similar.  Interval increase in the sclerotic bone metastasis.    09/30/2021 -  Chemotherapy   started on abemaciclib 100 mg twice daily and letrozole.   02/24/2022 Imaging    CT chest abdomen pelvis with contrast showed unchanged bilateral pleural effusion with associated intralobular septal thickening of the lung bases.  Without discrete nodularity consistent with stable appearance of treated pleural and lymphangitic metastatic disease.  Stable or minimally diminished subcentimeter liver lesions.  Unchanged left adrenal metastasis.  Unchanged widespread sclerotic osseous metastatic disease involving the included axial and appendicular skeleton.  No evidence of new metastatic disease in the chest abdomen or pelvis.  Coronary artery disease.   03/06/2022 Procedure   Ultrasound guided thoracentesis, removal of pleural fluid.  05/29/2022 Imaging   CT chest abdomen pelvis 1. Stable CT of the chest, abdomen and pelvis. 2. Unchanged appearance of bilateral pleural effusions, pleural nodularity, and interlobular septal thickening compatible with pleural and lymphangitic metastatic disease. 3. Stable appearance of multifocal sclerotic bone metastases. 4. Multifocal low-attenuation liver lesions are unchanged in the interval. 5. Stable appearance of left adrenal gland metastases. 6. Stable appearance of small pericardial effusion.7. No new or progressive disease identified.8. Aortic Atherosclerosis (ICD10-I70.0).   06/06/2022 Imaging   Bone scan Constellation of findings are consistent with  multifocal osseous metastatic disease throughout the axial and appendicular skeleton.Of note, osseous metastatic disease involves the RIGHT greater than LEFT femur. Consider evaluation with dedicated hip and femur radiographs to evaluate for overall disease burden and potential risk for pathologic fracture.   08/31/2022 Imaging   CT chest abdomen pelvis w contrast 1. Unchanged small right, trace left pleural effusions with associated diffuse interlobular septal thickening and fine fissural and perilymphatic nodularity throughout the lungs, findings consistent with lymphangitic metastatic disease. 2. Stable or slightly diminished very subtle hypodense lesion of the central right lobe of the liver, hepatic segment VIII measuring 0.6 cm, previously 0.8 cm. Additional unchanged, very subtle hypodense lesion of hepatic segment IV B measuring 1.1 cm. 3. Unchanged widespread sclerotic osseous metastatic disease throughout the included axial and proximal appendicular skeleton, particularly dense in the lower lumbar spine and sacrum. 4. Unchanged left adrenal nodule measuring 1.7 x 1.1 cm. 5. Overall constellation of findings is consistent with stable metastatic disease. 6. Coronary artery disease.    12/01/2022 Miscellaneous   [facility] liquid biopsy - PIK3CA mutation. 0.1% Not enough tissue for tissue NGS   03/08/2023 Imaging   Bone scan showed Multifocal osseous metastatic disease. One new focus along the right side of the skull    Patient follows up with cardiology for evaluation of bradycardia.  Cardiology notes reviewed.  72-hour Holter monitor revealed revealed predominant sinus rhythm with mean heart rate of 96 bpm, heart rate range 53 to 129 bpm, frequent premature ventricular contractions (27% burden), and intermittent atrial fibrillation (5% burden) with longest episode 3 hours. CHA2DS2-VASc score of 3, per cardiology recommendation patient was started on Eliquis 5 mg twice daily.      INTERVAL HISTORY CALLAWAY HARDIGREE is a 72 y.o. female who has above history reviewed by me today presents for follow up visit for metastatic breast cancer Occasional back and hip pain..  Denies any headache, nausea vomiting diarrhea.  Fever or chills. She reports feeling well.  Intermittent headache. Review of Systems  Constitutional:  Negative for appetite change, chills, fatigue and fever.  HENT:   Negative for hearing loss and voice change.   Eyes:  Negative for eye problems.  Respiratory:  Negative for chest tightness, cough and shortness of breath.   Cardiovascular:  Negative for chest pain.  Gastrointestinal:  Negative for abdominal distention, abdominal pain and blood in stool.  Endocrine: Negative for hot flashes.  Genitourinary:  Negative for difficulty urinating and frequency.   Musculoskeletal:  Negative for arthralgias.  Skin:  Negative for itching and rash.  Neurological:  Positive for numbness. Negative for extremity weakness.  Hematological:  Negative for adenopathy.  Psychiatric/Behavioral:  Negative for confusion.      MEDICAL HISTORY:  Past Medical History:  Diagnosis Date   A-fib [facility])    Breast cancer (HCC)    COPD (chronic obstructive pulmonary disease) (HCC)    Family history of adverse reaction to anesthesia    brother  has problem with coming out of anesthesia   High cholesterol    Hypertension    Personal history of radiation therapy    Pre-diabetes     SURGICAL HISTORY: Past Surgical History:  Procedure Laterality Date   BREAST LUMPECTOMY Left    2010   COLONOSCOPY     EYE SURGERY Left    Cataract surgery   PORTACATH PLACEMENT N/A 08/01/2021   Procedure: INSERTION PORT-A-CATH;  Surgeon: Carolan Shiver, MD;  Location: [facility] ORS;  Service: General;  Laterality: N/A;   thorocentesis Right 07/10/2021   and another on 07/25/21    SOCIAL HISTORY: Social History   Socioeconomic History   Marital status: Married    Spouse name:  Not on file   Number of children: Not on file   Years of education: Not on file   Highest education level: Not on file  Occupational History   Not on file  Tobacco Use   Smoking status: Former    Current packs/day: 0.00    Average packs/day: 1 pack/day for 25.0 years (25.0 ttl pk-yrs)    Types: Cigarettes    Start date: 3    Quit date: 32    Years since quitting: 34.7   Smokeless tobacco: Never  Vaping Use   Vaping status: Never Used  Substance and Sexual Activity   Alcohol use: Not Currently   Drug use: Never   Sexual activity: Not on file  Other Topics Concern   Not on file  Social History Narrative   Not on file   Social Determinants of Health   Financial Resource Strain: Low Risk  (06/08/2022)   Received from [facility]   Overall Financial Resource Strain (CARDIA)  Food Insecurity: No Food Insecurity (06/08/2022)   Received from [facility]   Hunger Vital Sign  Transportation Needs: No Transportation Needs (06/08/2022)   Received from [facility]   PRAPARE - Transportation  Physical Activity: Not on file  Stress: Not on file  Social Connections: Not on file  Intimate Partner Violence: Not on file    FAMILY HISTORY: Family History  Problem Relation Age of Onset   Cancer Mother        gynecological   Lung cancer Mother    Diabetes Father    Heart disease Father    Parkinson's disease Father    Brain cancer Brother    Bladder Cancer Brother    Pulmonary disease Brother    Rheumatic fever Brother    Breast cancer Neg Hx     ALLERGIES:  is allergic to green tea (camellia sinensis), melaleuca viridiflora, tea tree oil, and lisinopril.  MEDICATIONS:  Current Outpatient Medications  Medication Sig Dispense Refill   acetaminophen (TYLENOL) 650 MG CR tablet Take 650 mg by mouth every 8 (eight) hours as needed for pain.     atorvastatin (LIPITOR) 40 MG tablet Take 40 mg by mouth at bedtime.      Boswellia-Glucosamine-Vit D (OSTEO BI-FLEX-GLUCOS/5-LOXIN) TABS Take 1 capsule by mouth in the morning and at bedtime.     Calcium Carb-Cholecalciferol 600-400 MG-UNIT TABS Take 1 tablet by mouth daily.     cyanocobalamin 1000 MCG tablet Take 1,000 mcg by mouth daily.     Docusate Sodium (DSS) 100 MG CAPS Take 1 capsule by mouth daily.     ELIQUIS 5 MG TABS tablet Take 5 mg by mouth 2 (two) times daily.     gabapentin (NEURONTIN) 300 MG capsule TAKE 2 CAPSULES  BY MOUTH IN THE MORNING AND 3 IN THE EVENING 150 capsule 0   letrozole (FEMARA) 2.5 MG tablet Take 1 tablet (2.5 mg total) by mouth daily. 90 tablet 2   lidocaine-prilocaine (EMLA) cream Apply small amount to port and cover with saran wrap 1-2 hours prior to port access 30 g 5   LORazepam (ATIVAN) 0.5 MG tablet TAKE 1 TABLET BY MOUTH AT BEDTIME AS NEEDED FOR ANXIETY 30 tablet 0   losartan (COZAAR) 50 MG tablet Take 50 mg by mouth daily.     magnesium chloride (SLOW-MAG) 64 MG TBEC SR tablet Take 1 tablet by mouth at bedtime.     melatonin (MELATONIN MAXIMUM STRENGTH) 5 MG TABS Take 1 tablet by mouth at bedtime.     promethazine-dextromethorphan (PROMETHAZINE-DM) 6.25-15 MG/5ML syrup Take 5 mLs by mouth 4 (four) times daily as needed. 118 mL 0   Turmeric (QC TUMERIC COMPLEX PO) Take 1 capsule by mouth at bedtime.     abemaciclib (VERZENIO) 150 MG tablet Take 1 tablet (150 mg total) by mouth 2 (two) times daily. 56 tablet 0   diphenoxylate-atropine (LOMOTIL) 2.5-0.025 MG tablet Take 1 tablet by mouth 4 (four) times daily as needed for diarrhea or loose stools. (Patient not taking: Reported on 12/01/2022) 30 tablet 0   Loperamide HCl (IMODIUM PO) Take by mouth. (Patient not taking: Reported on 12/01/2022)     ondansetron (ZOFRAN) 8 MG tablet Take by mouth every 8 (eight) hours as needed for nausea or vomiting. (Patient not taking: Reported on 11/02/2022)     prochlorperazine (COMPAZINE) 5 MG tablet Take 5 mg by mouth every 6 (six) hours as needed for  nausea or vomiting. (Patient not taking: Reported on 12/01/2022)     No current facility-administered medications for this visit.   Facility-Administered Medications Ordered in Other Visits  Medication Dose Route Frequency Provider Last Rate Last Admin   sodium chloride flush (NS) 0.9 % injection 10 mL  10 mL Intravenous Once Rickard Patience, MD         PHYSICAL EXAMINATION: ECOG PERFORMANCE STATUS: 1 - Symptomatic but completely ambulatory Vitals:   06/25/23 1041  BP: 118/69  Pulse: 98  Resp: 18  Temp: (!) 97.2 F (36.2 C)  SpO2: 100%   Filed Weights   06/25/23 1041  Weight: 191 lb 6.4 oz (86.8 kg)    Physical Exam Constitutional:      General: She is not in acute distress.    Appearance: She is not diaphoretic.  HENT:     Head: Normocephalic and atraumatic.     Nose: Nose normal.     Mouth/Throat:     Pharynx: No oropharyngeal exudate.  Eyes:     General: No scleral icterus.    Pupils: Pupils are equal, round, and reactive to light.  Cardiovascular:     Rate and Rhythm: Normal rate and regular rhythm.     Heart sounds: No murmur heard. Pulmonary:     Effort: Pulmonary effort is normal. No respiratory distress.     Comments: Decreased breath sound bilaterally. Abdominal:     General: There is no distension.     Palpations: Abdomen is soft.     Tenderness: There is no abdominal tenderness.  Musculoskeletal:        General: Normal range of motion.     Cervical back: Normal range of motion and neck supple.  Skin:    General: Skin is warm and dry.     Findings: No erythema.  Neurological:  Mental Status: She is alert and oriented to person, place, and time.     Cranial Nerves: No cranial nerve deficit.     Motor: No abnormal muscle tone.     Coordination: Coordination normal.  Psychiatric:        Mood and Affect: Affect normal.       LABORATORY DATA:  I have reviewed the data as listed     Latest Ref Rng & Units 06/21/2023    8:37 AM 04/22/2023    9:01 AM  02/18/2023    9:00 AM  CBC  WBC 4.0 - 10.5 K/uL 4.5  4.0  4.2   Hemoglobin 12.0 - 15.0 g/dL 84.1  32.4  40.1   Hematocrit 36.0 - 46.0 % 33.1  33.1  34.2   Platelets 150 - 400 K/uL 185  174  187       Latest Ref Rng & Units 06/21/2023    9:18 AM 06/21/2023    8:37 AM 04/22/2023    9:01 AM  CMP  Glucose 70 - 99 mg/dL  027  253   BUN 8 - 23 mg/dL  17  20   Creatinine 6.64 - 1.00 mg/dL 4.03  4.74  2.59   Sodium 135 - 145 mmol/L  138  136   Potassium 3.5 - 5.1 mmol/L  3.5  3.8   Chloride 98 - 111 mmol/L  105  104   CO2 22 - 32 mmol/L  24  25   Calcium 8.9 - 10.3 mg/dL  8.6  9.1   Total Protein 6.5 - 8.1 g/dL  6.4  6.7   Total Bilirubin 0.3 - 1.2 mg/dL  0.8  0.6   Alkaline Phos 38 - 126 U/L  52  55   AST 15 - 41 U/L  25  31   ALT 0 - 44 U/L  19  20      RADIOGRAPHIC STUDIES: I have personally reviewed the radiological images as listed and agreed with the findings in the report. CT CHEST ABDOMEN PELVIS W CONTRAST  Result Date: 06/25/2023 CLINICAL DATA:  Breast cancer.  * Tracking Code: BO * EXAM: CT CHEST, ABDOMEN, AND PELVIS WITH CONTRAST TECHNIQUE: Multidetector CT imaging of the chest, abdomen and pelvis was performed following the standard protocol during bolus administration of intravenous contrast. RADIATION DOSE REDUCTION: This exam was performed according to the departmental dose-optimization program which includes automated exposure control, adjustment of the mA and/or kV according to patient size and/or use of iterative reconstruction technique. CONTRAST:  OMNIPAQUE IOHEXOL 300 MG/ML  SOLN COMPARISON:  02/18/2023. FINDINGS: CT CHEST FINDINGS Cardiovascular: Right IJ Port-A-Cath terminates in the right atrium. Atherosclerotic calcification of the aorta and coronary arteries. Heart is enlarged. Pericardial thickening or small pericardial effusion. Mediastinum/Nodes: No pathologically enlarged mediastinal, hilar or axillary lymph nodes. Esophagus is grossly unremarkable.  Lungs/Pleura: Mild basilar predominant septal thickening and slight fissural nodularity, similar to 02/18/2023. Small right and trace left pleural effusions. No pleural fluid. Airway is unremarkable. Musculoskeletal: Sclerotic lesions are seen throughout the visualized osseous structures, similar to 02/18/2023. Associated pathologic compression fractures in the thoracic spine, as before. CT ABDOMEN PELVIS FINDINGS Hepatobiliary: Liver is borderline enlarged, 18.0 cm. There are subtle low-attenuation lesions in the liver, likely stable from 02/18/2023 when remeasured in a similar fashion. Gallbladder is unremarkable. No biliary ductal dilatation. Pancreas: Negative. Spleen: Negative. Adrenals/Urinary Tract: Adrenal glands are unremarkable. Left adrenal nodules measure up to 1.4 cm and 102 Hounsfield units with a relative washout of  less than 40%, indeterminate. However, nodules are chronically stable favoring benign adenomas. No specific follow-up necessary other than on routine imaging. Bilateral renal sinus cysts. No specific follow-up necessary. Ureters are decompressed. Bladder is grossly unremarkable. Stomach/Bowel: Stomach, small bowel and colon are unremarkable. Appendix is not readily visualized. Vascular/Lymphatic: Atherosclerotic calcification of the aorta. No pathologically enlarged lymph nodes. Reproductive: Uterus is visualized.  No adnexal mass. Other: No free fluid. Similar mild haziness and subcentimeter nodularity in the small bowel mesentery. Mesenteries and peritoneum are otherwise unremarkable. Left hemidiaphragm is elevated. Small bilateral inguinal hernias contain fat. Musculoskeletal: Sclerotic lesions throughout the visualized osseous structures, as before. IMPRESSION: 1. Subtle hypoattenuating liver metastases, grossly stable from 02/18/2023. 2. Chronic basilar pulmonary parenchymal septal thickening and perifissural nodularity, stable and indicative of lymphangitic carcinomatosis. 3. Stable  osseous metastatic disease. 4. Similar pericardial thickening or trace pericardial effusion. 5. Small right and trace left pleural effusions. 6. Borderline hepatomegaly. 7. Chronically stable left adrenal nodules, likely adenomas. 8. Aortic atherosclerosis (ICD10-I70.0). Coronary artery calcification Electronically Signed   By: Leanna Battles M.D.   On: 06/25/2023 09:02   DG Chest 2 View  Result Date: 05/14/2023 CLINICAL DATA:  Cough, headache, nasal congestion, runny nose EXAM: CHEST - 2 VIEW COMPARISON:  Chest radiograph 12/29/2022 FINDINGS: The right chest wall port is stable The cardiomediastinal silhouette is stable There is no focal consolidation or pulmonary edema. Blunting of the costophrenic angles is unchanged, favored to reflect scarring. There is no convincing pleural effusion. There is no pneumothorax There is no acute osseous abnormality. Mild compression deformity of mid and lower thoracic vertebral bodies is unchanged. IMPRESSION: Stable chest with no radiographic evidence of acute cardiopulmonary process. Electronically Signed   By: Lesia Hausen M.D.   On: 05/14/2023 15:39

## 2023-06-25 NOTE — Assessment & Plan Note (Signed)
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 Continue calcium and vitamin D supplementation. .  Repeat bone scan-result reviewed.

## 2023-06-25 NOTE — Assessment & Plan Note (Signed)
Intermittent A-fib, now on Eliquis 5 mg twice daily. Abemaciclib is associated with <5% risk of A-fib. Patient has increased risk of bleeding.  Close monitor.   Continue follow-up with cardiology.

## 2023-06-25 NOTE — Assessment & Plan Note (Signed)
Influenza vaccination today 

## 2023-06-29 ENCOUNTER — Encounter: Payer: Self-pay | Admitting: Oncology

## 2023-07-08 ENCOUNTER — Other Ambulatory Visit: Payer: Self-pay

## 2023-07-08 DIAGNOSIS — C50919 Malignant neoplasm of unspecified site of unspecified female breast: Secondary | ICD-10-CM

## 2023-07-08 NOTE — Progress Notes (Unsigned)
cbc

## 2023-07-09 ENCOUNTER — Inpatient Hospital Stay: Payer: Medicare PPO | Attending: Oncology

## 2023-07-09 DIAGNOSIS — C7972 Secondary malignant neoplasm of left adrenal gland: Secondary | ICD-10-CM | POA: Diagnosis not present

## 2023-07-09 DIAGNOSIS — C50919 Malignant neoplasm of unspecified site of unspecified female breast: Secondary | ICD-10-CM | POA: Insufficient documentation

## 2023-07-09 DIAGNOSIS — C7951 Secondary malignant neoplasm of bone: Secondary | ICD-10-CM | POA: Diagnosis present

## 2023-07-09 DIAGNOSIS — Z170 Estrogen receptor positive status [ER+]: Secondary | ICD-10-CM | POA: Insufficient documentation

## 2023-07-09 DIAGNOSIS — C7971 Secondary malignant neoplasm of right adrenal gland: Secondary | ICD-10-CM | POA: Diagnosis not present

## 2023-07-09 DIAGNOSIS — Z95828 Presence of other vascular implants and grafts: Secondary | ICD-10-CM

## 2023-07-09 LAB — CBC WITH DIFFERENTIAL (CANCER CENTER ONLY)
Abs Immature Granulocytes: 0.01 10*3/uL (ref 0.00–0.07)
Basophils Absolute: 0 10*3/uL (ref 0.0–0.1)
Basophils Relative: 1 %
Eosinophils Absolute: 0.1 10*3/uL (ref 0.0–0.5)
Eosinophils Relative: 2 %
HCT: 34.1 % — ABNORMAL LOW (ref 36.0–46.0)
Hemoglobin: 11.3 g/dL — ABNORMAL LOW (ref 12.0–15.0)
Immature Granulocytes: 0 %
Lymphocytes Relative: 18 %
Lymphs Abs: 0.8 10*3/uL (ref 0.7–4.0)
MCH: 31.9 pg (ref 26.0–34.0)
MCHC: 33.1 g/dL (ref 30.0–36.0)
MCV: 96.3 fL (ref 80.0–100.0)
Monocytes Absolute: 0.4 10*3/uL (ref 0.1–1.0)
Monocytes Relative: 8 %
Neutro Abs: 3.2 10*3/uL (ref 1.7–7.7)
Neutrophils Relative %: 71 %
Platelet Count: 195 10*3/uL (ref 150–400)
RBC: 3.54 MIL/uL — ABNORMAL LOW (ref 3.87–5.11)
RDW: 14.9 % (ref 11.5–15.5)
WBC Count: 4.6 10*3/uL (ref 4.0–10.5)
nRBC: 0 % (ref 0.0–0.2)

## 2023-07-09 MED ORDER — SODIUM CHLORIDE 0.9% FLUSH
10.0000 mL | Freq: Once | INTRAVENOUS | Status: AC
  Administered 2023-07-09: 10 mL via INTRAVENOUS
  Filled 2023-07-09: qty 10

## 2023-07-09 MED ORDER — HEPARIN SOD (PORK) LOCK FLUSH 100 UNIT/ML IV SOLN
500.0000 [IU] | Freq: Once | INTRAVENOUS | Status: AC
  Administered 2023-07-09: 500 [IU] via INTRAVENOUS
  Filled 2023-07-09: qty 5

## 2023-07-16 ENCOUNTER — Other Ambulatory Visit: Payer: Medicare PPO

## 2023-07-16 ENCOUNTER — Inpatient Hospital Stay: Payer: Medicare PPO

## 2023-07-16 ENCOUNTER — Ambulatory Visit
Admission: RE | Admit: 2023-07-16 | Discharge: 2023-07-16 | Disposition: A | Discharge status: Home / Self Care | Payer: Medicare PPO | Source: Ambulatory Visit | Attending: Oncology | Admitting: Oncology

## 2023-07-16 DIAGNOSIS — C7951 Secondary malignant neoplasm of bone: Secondary | ICD-10-CM | POA: Diagnosis not present

## 2023-07-16 DIAGNOSIS — C50919 Malignant neoplasm of unspecified site of unspecified female breast: Secondary | ICD-10-CM | POA: Insufficient documentation

## 2023-07-16 LAB — CBC WITH DIFFERENTIAL (CANCER CENTER ONLY)
Abs Immature Granulocytes: 0.07 10*3/uL (ref 0.00–0.07)
Basophils Absolute: 0 10*3/uL (ref 0.0–0.1)
Basophils Relative: 1 %
Eosinophils Absolute: 0.1 10*3/uL (ref 0.0–0.5)
Eosinophils Relative: 2 %
HCT: 34.1 % — ABNORMAL LOW (ref 36.0–46.0)
Hemoglobin: 11.2 g/dL — ABNORMAL LOW (ref 12.0–15.0)
Immature Granulocytes: 2 %
Lymphocytes Relative: 19 %
Lymphs Abs: 0.8 10*3/uL (ref 0.7–4.0)
MCH: 32 pg (ref 26.0–34.0)
MCHC: 32.8 g/dL (ref 30.0–36.0)
MCV: 97.4 fL (ref 80.0–100.0)
Monocytes Absolute: 0.4 10*3/uL (ref 0.1–1.0)
Monocytes Relative: 9 %
Neutro Abs: 2.9 10*3/uL (ref 1.7–7.7)
Neutrophils Relative %: 67 %
Platelet Count: 168 10*3/uL (ref 150–400)
RBC: 3.5 MIL/uL — ABNORMAL LOW (ref 3.87–5.11)
RDW: 14.8 % (ref 11.5–15.5)
WBC Count: 4.3 10*3/uL (ref 4.0–10.5)
nRBC: 0 % (ref 0.0–0.2)

## 2023-07-16 LAB — CMP (CANCER CENTER ONLY)
ALT: 18 U/L (ref 0–44)
AST: 25 U/L (ref 15–41)
Albumin: 3.9 g/dL (ref 3.5–5.0)
Alkaline Phosphatase: 51 U/L (ref 38–126)
Anion gap: 6 (ref 5–15)
BUN: 18 mg/dL (ref 8–23)
CO2: 24 mmol/L (ref 22–32)
Calcium: 9 mg/dL (ref 8.9–10.3)
Chloride: 107 mmol/L (ref 98–111)
Creatinine: 1.04 mg/dL — ABNORMAL HIGH (ref 0.44–1.00)
GFR, Estimated: 57 mL/min — ABNORMAL LOW (ref >60)
Glucose, Bld: 134 mg/dL — ABNORMAL HIGH (ref 70–99)
Potassium: 3.6 mmol/L (ref 3.5–5.1)
Sodium: 137 mmol/L (ref 135–145)
Total Bilirubin: 1.2 mg/dL (ref 0.3–1.2)
Total Protein: 6.8 g/dL (ref 6.5–8.1)

## 2023-07-16 LAB — IRON AND TIBC
Iron: 94 ug/dL (ref 28–170)
Saturation Ratios: 29 % (ref 10.4–31.8)
TIBC: 322 ug/dL (ref 250–450)
UIBC: 228 ug/dL

## 2023-07-16 LAB — RETIC PANEL
Immature Retic Fract: 12.9 % (ref 2.3–15.9)
RBC.: 3.51 MIL/uL — ABNORMAL LOW (ref 3.87–5.11)
Retic Count, Absolute: 50.2 10*3/uL (ref 19.0–186.0)
Retic Ct Pct: 1.4 % (ref 0.4–3.1)
Reticulocyte Hemoglobin: 35.6 pg (ref >27.9)

## 2023-07-16 LAB — FERRITIN: Ferritin: 109 ng/mL (ref 11–307)

## 2023-07-16 MED ORDER — SODIUM CHLORIDE 0.9% FLUSH
10.0000 mL | Freq: Once | INTRAVENOUS | Status: AC
  Administered 2023-07-16: 10 mL via INTRAVENOUS
  Filled 2023-07-16: qty 10

## 2023-07-16 MED ORDER — GADOBUTROL 1 MMOL/ML IV SOLN
9.0000 mL | Freq: Once | INTRAVENOUS | Status: AC | PRN
  Administered 2023-07-16: 9 mL via INTRAVENOUS

## 2023-07-16 MED ORDER — HEPARIN SOD (PORK) LOCK FLUSH 100 UNIT/ML IV SOLN
INTRAVENOUS | Status: AC
  Filled 2023-07-16: qty 5

## 2023-07-16 MED ORDER — HEPARIN SOD (PORK) LOCK FLUSH 100 UNIT/ML IV SOLN
500.0000 [IU] | Freq: Once | INTRAVENOUS | Status: AC
  Administered 2023-07-16: 500 [IU] via INTRAVENOUS
  Filled 2023-07-16: qty 5

## 2023-07-17 LAB — CANCER ANTIGEN 27.29: CA 27.29: 62.2 U/mL — ABNORMAL HIGH (ref 0.0–38.6)

## 2023-07-17 LAB — CANCER ANTIGEN 15-3: CA 15-3: 48.3 U/mL — ABNORMAL HIGH (ref 0.0–25.0)

## 2023-07-20 ENCOUNTER — Other Ambulatory Visit: Payer: Self-pay | Admitting: Oncology

## 2023-07-20 ENCOUNTER — Other Ambulatory Visit (HOSPITAL_COMMUNITY): Payer: Self-pay

## 2023-07-20 DIAGNOSIS — C50919 Malignant neoplasm of unspecified site of unspecified female breast: Secondary | ICD-10-CM

## 2023-07-21 ENCOUNTER — Other Ambulatory Visit: Payer: Self-pay | Admitting: Oncology

## 2023-07-22 ENCOUNTER — Encounter: Payer: Self-pay | Admitting: Oncology

## 2023-07-23 ENCOUNTER — Encounter: Payer: Self-pay | Admitting: Oncology

## 2023-07-23 ENCOUNTER — Other Ambulatory Visit: Payer: Self-pay

## 2023-07-23 MED ORDER — GABAPENTIN 300 MG PO CAPS: ORAL_CAPSULE | ORAL | 0 refills | Status: DC

## 2023-07-30 ENCOUNTER — Inpatient Hospital Stay: Payer: Medicare PPO | Attending: Oncology | Admitting: Oncology

## 2023-07-30 ENCOUNTER — Encounter: Payer: Self-pay | Admitting: Oncology

## 2023-07-30 VITALS — BP 137/62 | HR 73 | Temp 96.7°F | Resp 18 | Wt 188.4 lb

## 2023-07-30 DIAGNOSIS — C7972 Secondary malignant neoplasm of left adrenal gland: Secondary | ICD-10-CM | POA: Insufficient documentation

## 2023-07-30 DIAGNOSIS — Z87891 Personal history of nicotine dependence: Secondary | ICD-10-CM | POA: Diagnosis not present

## 2023-07-30 DIAGNOSIS — Z170 Estrogen receptor positive status [ER+]: Secondary | ICD-10-CM | POA: Insufficient documentation

## 2023-07-30 DIAGNOSIS — R739 Hyperglycemia, unspecified: Secondary | ICD-10-CM | POA: Diagnosis not present

## 2023-07-30 DIAGNOSIS — C50919 Malignant neoplasm of unspecified site of unspecified female breast: Secondary | ICD-10-CM

## 2023-07-30 DIAGNOSIS — T451X5A Adverse effect of antineoplastic and immunosuppressive drugs, initial encounter: Secondary | ICD-10-CM

## 2023-07-30 DIAGNOSIS — C7971 Secondary malignant neoplasm of right adrenal gland: Secondary | ICD-10-CM | POA: Insufficient documentation

## 2023-07-30 DIAGNOSIS — K521 Toxic gastroenteritis and colitis: Secondary | ICD-10-CM

## 2023-07-30 DIAGNOSIS — E785 Hyperlipidemia, unspecified: Secondary | ICD-10-CM

## 2023-07-30 DIAGNOSIS — Z923 Personal history of irradiation: Secondary | ICD-10-CM | POA: Insufficient documentation

## 2023-07-30 DIAGNOSIS — C787 Secondary malignant neoplasm of liver and intrahepatic bile duct: Secondary | ICD-10-CM | POA: Diagnosis not present

## 2023-07-30 DIAGNOSIS — G620 Drug-induced polyneuropathy: Secondary | ICD-10-CM | POA: Diagnosis not present

## 2023-07-30 DIAGNOSIS — Z79811 Long term (current) use of aromatase inhibitors: Secondary | ICD-10-CM | POA: Insufficient documentation

## 2023-07-30 DIAGNOSIS — C7951 Secondary malignant neoplasm of bone: Secondary | ICD-10-CM | POA: Diagnosis present

## 2023-07-30 DIAGNOSIS — Z5111 Encounter for antineoplastic chemotherapy: Secondary | ICD-10-CM

## 2023-07-30 NOTE — Progress Notes (Signed)
Hematology/Oncology Progress note Telephone:([phone] Fax:([phone]     CHIEF COMPLAINTS/REASON FOR VISIT:  metastatic breast caner ASSESSMENT & PLAN:   Cancer Staging  Primary malignant neoplasm of breast with metastasis (HCC) Staging form: Breast, AJCC 8th Edition - Clinical stage from 07/12/2021: Stage IV (rcTX, cNX, pM1, ER+, PR+, HER2-) - Signed by Rickard Patience, MD on 03/08/2022   Primary malignant neoplasm of breast with metastasis (HCC) Metastatic Breast cancer with extensive thoracic and bone involvement, in visceral crisis, ER+, PR+ HER2 neg Labs reviewed and discussed with patient. 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   CT- Stable radiographic disease.  Tumor marker is gradually increasing. Labs are reviewed and discussed with patient. Continue abemaciclib 150 mg twice daily plus letrozole 2.5mg  daily. She tolerates well with manageable diarrhea  Encounter for antineoplastic chemotherapy Chemotherapy plan as listed above   Chemotherapy-induced neuropathy (HCC) #Grade 2 neuropathy, continue gabapentin,  Continue gabapentin 600 mg in a.m., she lower the night time dose to 600 mg  Declined acupuncture    Metastasis to bone [facility]) 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 Continue calcium and vitamin D supplementation. Marland Kitchen  MRI brain showed enlarging right frontal calvarium - refer to radonc for evaluation of RT  Zometa Q2 months  Hyperglycemia Check A1c  Chemotherapy induced diarrhea Stop colace. Imodium PRN     Orders Placed This Encounter  Procedures   CT CHEST ABDOMEN PELVIS W CONTRAST    Standing Status:   Future    Standing Expiration Date:   07/29/2024    Order Specific Question:   If indicated for the ordered procedure, I authorize the administration of  contrast media per Radiology protocol    Answer:   Yes    Order Specific Question:   Does the patient have a contrast media/X-ray dye allergy?    Answer:   Yes    Order Specific Question:   Preferred imaging location?    Answer:   [facility]    Order Specific Question:   If indicated for the ordered procedure, I authorize the administration of oral contrast media per Radiology protocol    Answer:   Yes   Hemoglobin A1c    Standing Status:   Future    Standing Expiration Date:   07/29/2024   Lipid panel    Standing Status:   Future    Standing Expiration Date:   07/29/2024   Cancer antigen 15-3    Standing Status:   Future    Standing Expiration Date:   07/29/2024   Cancer antigen 27.29    Standing Status:   Future    Standing Expiration Date:   07/29/2024   CMP (Cancer Center only)    Standing Status:   Future    Standing Expiration Date:   07/29/2024   CBC with Differential (Cancer Center Only)    Standing Status:   Future    Standing Expiration Date:   07/29/2024    Follow up 4 weeks All questions were answered. The patient knows to call the clinic with any problems, questions or concerns.  Rickard Patience, MD, PhD [facility] Hematology Oncology 07/30/2023      HISTORY OF PRESENTING ILLNESS:   Amber Blevins is a  72 y.o.  female with PMH listed below presents for follow up for treatment of metastatic breast cancer.  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 Additional metastatic involvement T12, L1 through L3.  No primary tumor site identified. Top differential considerations are Metastatic Disease Unknown primary, less likely Lymphoma or Multiple Myeloma.   2. Superimposed lumbar spine degeneration, including degenerative moderate to severe left L3 and L4 nerve level impingement from disc herniation.      07/02/2021 Imaging   CT chest abdomen pelvis showed innumerable small pulmonary and pleural nodules consistent with diffuse metastasis.  Associated with probable malignant pleural effusion.  Mediastinal and hilar lymphadenopathy consistent with metastatic disease.  Hepatic and bilateral adrenal gland metastasis.  Diffuse extensive destructive metastatic bone disease involving pelvis.   07/12/2021 Cancer Staging   Staging form: Breast, AJCC 8th Edition - Clinical stage from 07/12/2021: Stage IV (rcTX, cNX, pM1, ER+, PR+, HER2-) - Signed by Rickard Patience, MD on 03/08/2022 Stage prefix: Recurrence   07/17/2021 Initial Diagnosis   Metastatic breast cancer -history of breast cancer, diagnosed in 2010 Left-sided T2 (2.4cm) N0, ER/PR positive, HER2 negative IDC of the breast, s/p lumpectomy by Dr.Meyer at [facility]. Oncotype score of 11 who completed radiation and she finished 10 years of Femara from 05/2009 and stopped in 2020.  -07/12/2021 patient underwent right thoracentesis.  Cytology was positive for metastatic carcinoma, compatible with breast origin.  ER/PR +, HER2 negative -07/16/2021, patient underwent iliac bone biopsy.  Positive for metastatic carcinoma.   07/21/2021 - 09/16/2021 Chemotherapy   Patient is on Treatment Plan : BREAST Paclitaxel D1,8,15 q28d     09/04/2021 Imaging   CT chest abdomen pelvis showed improvement of the pulmonary nodularity and thickening in the left and the right lung.  Persistent nodular interstitial and pleural thickening  remains.  Improvement of mediastinal and hilar adenopathy, hepatic metastatic lesions are similar.  Adrenal gland metastasis is similar.  Interval increase in the sclerotic bone metastasis.    09/30/2021 -  Chemotherapy   started on abemaciclib 100 mg twice daily and letrozole.   02/24/2022 Imaging    CT chest abdomen pelvis with contrast showed unchanged bilateral pleural effusion with associated intralobular septal thickening of the lung bases.  Without discrete nodularity consistent with stable appearance of treated pleural and lymphangitic metastatic disease.  Stable or minimally diminished subcentimeter liver lesions.  Unchanged left adrenal metastasis.  Unchanged widespread sclerotic osseous metastatic disease involving the included axial and appendicular skeleton.  No evidence of new metastatic disease in the chest abdomen or pelvis.  Coronary artery disease.   03/06/2022 Procedure   Ultrasound guided thoracentesis, removal of pleural fluid.    05/29/2022 Imaging   CT chest abdomen pelvis 1. Stable CT of the chest, abdomen and pelvis. 2. Unchanged appearance of bilateral pleural effusions, pleural nodularity, and interlobular septal thickening compatible with pleural and lymphangitic metastatic disease. 3. Stable appearance of multifocal sclerotic bone metastases. 4. Multifocal low-attenuation liver lesions are unchanged in the interval. 5. Stable appearance of left adrenal gland  metastases. 6. Stable appearance of small pericardial effusion.7. No new or progressive disease identified.8. Aortic Atherosclerosis (ICD10-I70.0).   06/06/2022 Imaging   Bone scan Constellation of findings are consistent with multifocal osseous metastatic disease throughout the axial and appendicular skeleton.Of note, osseous metastatic disease involves the RIGHT greater than LEFT femur. Consider evaluation with dedicated hip and femur radiographs to evaluate for overall disease burden and potential risk for  pathologic fracture.   08/31/2022 Imaging   CT chest abdomen pelvis w contrast 1. Unchanged small right, trace left pleural effusions with associated diffuse interlobular septal thickening and fine fissural and perilymphatic nodularity throughout the lungs, findings consistent with lymphangitic metastatic disease. 2. Stable or slightly diminished very subtle hypodense lesion of the central right lobe of the liver, hepatic segment VIII measuring 0.6 cm, previously 0.8 cm. Additional unchanged, very subtle hypodense lesion of hepatic segment IV B measuring 1.1 cm. 3. Unchanged widespread sclerotic osseous metastatic disease throughout the included axial and proximal appendicular skeleton, particularly dense in the lower lumbar spine and sacrum. 4. Unchanged left adrenal nodule measuring 1.7 x 1.1 cm. 5. Overall constellation of findings is consistent with stable metastatic disease. 6. Coronary artery disease.    12/01/2022 Miscellaneous   [facility] liquid biopsy - PIK3CA mutation. 0.1% Not enough tissue for tissue NGS   03/08/2023 Imaging   Bone scan showed Multifocal osseous metastatic disease. One new focus along the right side of the skull    Patient follows up with cardiology for evaluation of bradycardia.  Cardiology notes reviewed.  72-hour Holter monitor revealed revealed predominant sinus rhythm with mean heart rate of 96 bpm, heart rate range 53 to 129 bpm, frequent premature ventricular contractions (27% burden), and intermittent atrial fibrillation (5% burden) with longest episode 3 hours. CHA2DS2-VASc score of 3, per cardiology recommendation patient was started on Eliquis 5 mg twice daily.     INTERVAL HISTORY Amber Blevins is a 73 y.o. female who has above history reviewed by me today presents for follow up visit for metastatic breast cancer Occasional back and hip pain..  Denies any headache, nausea vomiting diarrhea.  Fever or chills. She reports feeling well.   Intermittent headache. She tolerates Abemaciclib 150mg  BID, manageable diarrhea  .  Review of Systems  Constitutional:  Negative for appetite change, chills, fatigue and fever.  HENT:   Negative for hearing loss and voice change.   Eyes:  Negative for eye problems.  Respiratory:  Negative for chest tightness, cough and shortness of breath.   Cardiovascular:  Negative for chest pain.  Gastrointestinal:  Negative for abdominal distention, abdominal pain and blood in stool.  Endocrine: Negative for hot flashes.  Genitourinary:  Negative for difficulty urinating and frequency.   Musculoskeletal:  Negative for arthralgias.  Skin:  Negative for itching and rash.  Neurological:  Positive for numbness. Negative for extremity weakness.  Hematological:  Negative for adenopathy.  Psychiatric/Behavioral:  Negative for confusion.      MEDICAL HISTORY:  Past Medical History:  Diagnosis Date   A-fib [facility])    Breast cancer (HCC)    COPD (chronic obstructive pulmonary disease) (HCC)    Family history of adverse reaction to anesthesia    brother has problem with coming out of anesthesia   High cholesterol    Hypertension    Personal history of radiation therapy    Pre-diabetes     SURGICAL HISTORY: Past Surgical History:  Procedure Laterality Date   BREAST LUMPECTOMY Left    2010   COLONOSCOPY  EYE SURGERY Left    Cataract surgery   PORTACATH PLACEMENT N/A 08/01/2021   Procedure: INSERTION PORT-A-CATH;  Surgeon: Carolan Shiver, MD;  Location: [facility] ORS;  Service: General;  Laterality: N/A;   thorocentesis Right 07/10/2021   and another on 07/25/21    SOCIAL HISTORY: Social History   Socioeconomic History   Marital status: Married    Spouse name: Not on file   Number of children: Not on file   Years of education: Not on file   Highest education level: Not on file  Occupational History   Not on file  Tobacco Use   Smoking status: Former    Current packs/day: 0.00     Average packs/day: 1 pack/day for 25.0 years (25.0 ttl pk-yrs)    Types: Cigarettes    Start date: 26    Quit date: 41    Years since quitting: 34.8   Smokeless tobacco: Never  Vaping Use   Vaping status: Never Used  Substance and Sexual Activity   Alcohol use: Not Currently   Drug use: Never   Sexual activity: Not on file  Other Topics Concern   Not on file  Social History Narrative   Not on file   Social Determinants of Health   Financial Resource Strain: Low Risk  (07/26/2023)   Received from [facility]   Overall Financial Resource Strain (CARDIA)    Difficulty of Paying Living Expenses: Not hard at all  Food Insecurity: No Food Insecurity (07/26/2023)   Received from [facility]   Hunger Vital Sign    Worried About Running Out of Food in the Last Year: Never true    Ran Out of Food in the Last Year: Never true  Transportation Needs: No Transportation Needs (07/26/2023)   Received from [facility] - Transportation    In the past 12 months, has lack of transportation kept you from medical appointments or from getting medications?: No    Lack of Transportation (Non-Medical): No  Physical Activity: Not on file  Stress: Not on file  Social Connections: Not on file  Intimate Partner Violence: Not on file    FAMILY HISTORY: Family History  Problem Relation Age of Onset   Cancer Mother        gynecological   Lung cancer Mother    Diabetes Father    Heart disease Father    Parkinson's disease Father    Brain cancer Brother    Bladder Cancer Brother    Pulmonary disease Brother    Rheumatic fever Brother    Breast cancer Neg Hx     ALLERGIES:  is allergic to green tea (camellia sinensis), melaleuca viridiflora, tea tree oil, and lisinopril.  MEDICATIONS:  Current Outpatient Medications  Medication Sig Dispense Refill   acetaminophen (TYLENOL) 650 MG CR tablet Take 650 mg by mouth every 8  (eight) hours as needed for pain.     atorvastatin (LIPITOR) 40 MG tablet Take 40 mg by mouth at bedtime.     Boswellia-Glucosamine-Vit D (OSTEO BI-FLEX-GLUCOS/5-LOXIN) TABS Take 1 capsule by mouth in the morning and at bedtime.     Calcium Carb-Cholecalciferol 600-400 MG-UNIT TABS Take 1 tablet by mouth daily.     cyanocobalamin 1000 MCG tablet Take 1,000 mcg by mouth daily.     Docusate Sodium (DSS) 100 MG CAPS Take 1 capsule by mouth daily.     ELIQUIS 5 MG TABS tablet Take 5 mg by mouth 2 (two)  times daily.     gabapentin (NEURONTIN) 300 MG capsule TAKE 2 CAPSULES BY MOUTH IN THE MORNING AND 3 IN THE EVENING 150 capsule 0   Iron-Vitamin C (VITRON-C) 65-125 MG TABS Take by mouth.     letrozole (FEMARA) 2.5 MG tablet Take 1 tablet (2.5 mg total) by mouth daily. 90 tablet 2   lidocaine-prilocaine (EMLA) cream Apply small amount to port and cover with saran wrap 1-2 hours prior to port access 30 g 5   LORazepam (ATIVAN) 0.5 MG tablet TAKE 1 TABLET BY MOUTH AT BEDTIME AS NEEDED FOR ANXIETY 30 tablet 0   losartan (COZAAR) 50 MG tablet Take 50 mg by mouth daily.     magnesium chloride (SLOW-MAG) 64 MG TBEC SR tablet Take 1 tablet by mouth at bedtime.     melatonin (MELATONIN MAXIMUM STRENGTH) 5 MG TABS Take 1 tablet by mouth at bedtime.     promethazine-dextromethorphan (PROMETHAZINE-DM) 6.25-15 MG/5ML syrup Take 5 mLs by mouth 4 (four) times daily as needed. 118 mL 0   Turmeric (QC TUMERIC COMPLEX PO) Take 1 capsule by mouth at bedtime.     VERZENIO 150 MG tablet TAKE 1 TABLET BY MOUTH TWICE DAILY 56 tablet 0   diphenoxylate-atropine (LOMOTIL) 2.5-0.025 MG tablet Take 1 tablet by mouth 4 (four) times daily as needed for diarrhea or loose stools. (Patient not taking: Reported on 12/01/2022) 30 tablet 0   Loperamide HCl (IMODIUM PO) Take by mouth. (Patient not taking: Reported on 12/01/2022)     ondansetron (ZOFRAN) 8 MG tablet Take by mouth every 8 (eight) hours as needed for nausea or vomiting.  (Patient not taking: Reported on 11/02/2022)     prochlorperazine (COMPAZINE) 5 MG tablet Take 5 mg by mouth every 6 (six) hours as needed for nausea or vomiting. (Patient not taking: Reported on 12/01/2022)     No current facility-administered medications for this visit.   Facility-Administered Medications Ordered in Other Visits  Medication Dose Route Frequency Provider Last Rate Last Admin   sodium chloride flush (NS) 0.9 % injection 10 mL  10 mL Intravenous Once Rickard Patience, MD         PHYSICAL EXAMINATION: ECOG PERFORMANCE STATUS: 1 - Symptomatic but completely ambulatory Vitals:   07/30/23 1046  BP: 137/62  Pulse: 73  Resp: 18  Temp: (!) 96.7 F (35.9 C)  SpO2: 100%   Filed Weights   07/30/23 1046  Weight: 188 lb 6.4 oz (85.5 kg)    Physical Exam Constitutional:      General: She is not in acute distress.    Appearance: She is not diaphoretic.  HENT:     Head: Normocephalic and atraumatic.     Nose: Nose normal.     Mouth/Throat:     Pharynx: No oropharyngeal exudate.  Eyes:     General: No scleral icterus.    Pupils: Pupils are equal, round, and reactive to light.  Cardiovascular:     Rate and Rhythm: Normal rate and regular rhythm.     Heart sounds: No murmur heard. Pulmonary:     Effort: Pulmonary effort is normal. No respiratory distress.     Comments: Decreased breath sound bilaterally. Abdominal:     General: There is no distension.     Palpations: Abdomen is soft.     Tenderness: There is no abdominal tenderness.  Musculoskeletal:        General: Normal range of motion.     Cervical back: Normal range of motion and neck supple.  Skin:    General: Skin is warm and dry.     Findings: No erythema.  Neurological:     Mental Status: She is alert and oriented to person, place, and time.     Cranial Nerves: No cranial nerve deficit.     Motor: No abnormal muscle tone.     Coordination: Coordination normal.  Psychiatric:        Mood and Affect: Affect  normal.      LABORATORY DATA:  I have reviewed the data as listed     Latest Ref Rng & Units 07/16/2023    9:30 AM 07/09/2023    9:50 AM 06/21/2023    8:37 AM  CBC  WBC 4.0 - 10.5 K/uL 4.3  4.6  4.5   Hemoglobin 12.0 - 15.0 g/dL 16.1  09.6  04.5   Hematocrit 36.0 - 46.0 % 34.1  34.1  33.1   Platelets 150 - 400 K/uL 168  195  185       Latest Ref Rng & Units 07/16/2023    9:30 AM 06/21/2023    9:18 AM 06/21/2023    8:37 AM  CMP  Glucose 70 - 99 mg/dL 409   811   BUN 8 - 23 mg/dL 18   17   Creatinine 9.14 - 1.00 mg/dL 7.82  9.56  2.13   Sodium 135 - 145 mmol/L 137   138   Potassium 3.5 - 5.1 mmol/L 3.6   3.5   Chloride 98 - 111 mmol/L 107   105   CO2 22 - 32 mmol/L 24   24   Calcium 8.9 - 10.3 mg/dL 9.0   8.6   Total Protein 6.5 - 8.1 g/dL 6.8   6.4   Total Bilirubin 0.3 - 1.2 mg/dL 1.2   0.8   Alkaline Phos 38 - 126 U/L 51   52   AST 15 - 41 U/L 25   25   ALT 0 - 44 U/L 18   19      RADIOGRAPHIC STUDIES: I have personally reviewed the radiological images as listed and agreed with the findings in the report. MR Brain W Wo Contrast  Result Date: 07/29/2023 CLINICAL DATA:  Breast cancer EXAM: MRI HEAD WITHOUT AND WITH CONTRAST TECHNIQUE: Multiplanar, multiecho pulse sequences of the brain and surrounding structures were obtained without and with intravenous contrast. CONTRAST:  9mL GADAVIST GADOBUTROL 1 MMOL/ML IV SOLN COMPARISON:  01/05/2023 FINDINGS: Brain: No restricted diffusion to suggest acute or subacute infarct. No abnormal parenchymal or meningeal enhancement. No acute hemorrhage, mass, mass effect, or midline shift. No hydrocephalus or extra-axial collection. Pituitary and craniocervical junction within normal limits. No hemosiderin deposition to suggest remote hemorrhage. Normal cerebral volume for age. Scattered T2 hyperintense signal in the periventricular white matter, likely the sequela of minimal chronic small vessel ischemic disease. Vascular: Normal arterial  flow voids. Normal arterial and venous enhancement. Skull and upper cervical spine: Slight interval increase in the size of a previously noted enhancing lesion right frontal calvarium, which now measures to 1.7 x 0.4 x 1.6 cm (AP x TR x CC) (series 16, image 112 and series 17, image 22), previously up to 1.3 x 0.4 x 1.4 cm when remeasured similarly. No new enhancing lesions. Otherwise normal marrow signal. Sinuses/Orbits: Mucosal thickening in the maxillary sinuses with a left maxillary sinus mucous retention cyst. Status post left lens replacement. No acute finding in the orbits. Other: The mastoid air cells are well aerated. IMPRESSION: 1. Slight  interval increase in the size of a previously noted enhancing lesion in the right frontal calvarium, concerning for metastatic disease. No new enhancing osseous lesions. 2. No evidence of metastatic disease in the brain. Electronically Signed   By: Wiliam Ke M.D.   On: 07/29/2023 15:13   CT CHEST ABDOMEN PELVIS W CONTRAST  Result Date: 06/25/2023 CLINICAL DATA:  Breast cancer.  * Tracking Code: BO * EXAM: CT CHEST, ABDOMEN, AND PELVIS WITH CONTRAST TECHNIQUE: Multidetector CT imaging of the chest, abdomen and pelvis was performed following the standard protocol during bolus administration of intravenous contrast. RADIATION DOSE REDUCTION: This exam was performed according to the departmental dose-optimization program which includes automated exposure control, adjustment of the mA and/or kV according to patient size and/or use of iterative reconstruction technique. CONTRAST:  OMNIPAQUE IOHEXOL 300 MG/ML  SOLN COMPARISON:  02/18/2023. FINDINGS: CT CHEST FINDINGS Cardiovascular: Right IJ Port-A-Cath terminates in the right atrium. Atherosclerotic calcification of the aorta and coronary arteries. Heart is enlarged. Pericardial thickening or small pericardial effusion. Mediastinum/Nodes: No pathologically enlarged mediastinal, hilar or axillary lymph nodes.  Esophagus is grossly unremarkable. Lungs/Pleura: Mild basilar predominant septal thickening and slight fissural nodularity, similar to 02/18/2023. Small right and trace left pleural effusions. No pleural fluid. Airway is unremarkable. Musculoskeletal: Sclerotic lesions are seen throughout the visualized osseous structures, similar to 02/18/2023. Associated pathologic compression fractures in the thoracic spine, as before. CT ABDOMEN PELVIS FINDINGS Hepatobiliary: Liver is borderline enlarged, 18.0 cm. There are subtle low-attenuation lesions in the liver, likely stable from 02/18/2023 when remeasured in a similar fashion. Gallbladder is unremarkable. No biliary ductal dilatation. Pancreas: Negative. Spleen: Negative. Adrenals/Urinary Tract: Adrenal glands are unremarkable. Left adrenal nodules measure up to 1.4 cm and 102 Hounsfield units with a relative washout of less than 40%, indeterminate. However, nodules are chronically stable favoring benign adenomas. No specific follow-up necessary other than on routine imaging. Bilateral renal sinus cysts. No specific follow-up necessary. Ureters are decompressed. Bladder is grossly unremarkable. Stomach/Bowel: Stomach, small bowel and colon are unremarkable. Appendix is not readily visualized. Vascular/Lymphatic: Atherosclerotic calcification of the aorta. No pathologically enlarged lymph nodes. Reproductive: Uterus is visualized.  No adnexal mass. Other: No free fluid. Similar mild haziness and subcentimeter nodularity in the small bowel mesentery. Mesenteries and peritoneum are otherwise unremarkable. Left hemidiaphragm is elevated. Small bilateral inguinal hernias contain fat. Musculoskeletal: Sclerotic lesions throughout the visualized osseous structures, as before. IMPRESSION: 1. Subtle hypoattenuating liver metastases, grossly stable from 02/18/2023. 2. Chronic basilar pulmonary parenchymal septal thickening and perifissural nodularity, stable and indicative of  lymphangitic carcinomatosis. 3. Stable osseous metastatic disease. 4. Similar pericardial thickening or trace pericardial effusion. 5. Small right and trace left pleural effusions. 6. Borderline hepatomegaly. 7. Chronically stable left adrenal nodules, likely adenomas. 8. Aortic atherosclerosis (ICD10-I70.0). Coronary artery calcification Electronically Signed   By: Leanna Battles M.D.   On: 06/25/2023 09:02   DG Chest 2 View  Result Date: 05/14/2023 CLINICAL DATA:  Cough, headache, nasal congestion, runny nose EXAM: CHEST - 2 VIEW COMPARISON:  Chest radiograph 12/29/2022 FINDINGS: The right chest wall port is stable The cardiomediastinal silhouette is stable There is no focal consolidation or pulmonary edema. Blunting of the costophrenic angles is unchanged, favored to reflect scarring. There is no convincing pleural effusion. There is no pneumothorax There is no acute osseous abnormality. Mild compression deformity of mid and lower thoracic vertebral bodies is unchanged. IMPRESSION: Stable chest with no radiographic evidence of acute cardiopulmonary process. Electronically Signed   By: Theron Arista  Noone M.D.   On: 05/14/2023 15:39

## 2023-07-30 NOTE — Assessment & Plan Note (Signed)
Stop colace. Imodium PRN

## 2023-07-30 NOTE — Assessment & Plan Note (Addendum)
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 Continue calcium and vitamin D supplementation. Marland Kitchen  MRI brain showed enlarging right frontal calvarium - refer to radonc for evaluation of RT  Zometa Q2 months

## 2023-07-30 NOTE — Assessment & Plan Note (Signed)
Chemotherapy plan as listed above 

## 2023-07-30 NOTE — Assessment & Plan Note (Signed)
Check A1c. 

## 2023-07-30 NOTE — Assessment & Plan Note (Addendum)
#  Grade 2 neuropathy, continue gabapentin,  Continue gabapentin 600 mg in a.m., she lower the night time dose to 600 mg  Declined acupuncture

## 2023-07-30 NOTE — Assessment & Plan Note (Addendum)
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   CT- Stable radiographic disease.  Tumor marker is gradually increasing. Labs are reviewed and discussed with patient. Continue abemaciclib 150 mg twice daily plus letrozole 2.5mg  daily. She tolerates well with manageable diarrhea

## 2023-08-04 ENCOUNTER — Ambulatory Visit
Admission: RE | Admit: 2023-08-04 | Discharge: 2023-08-04 | Disposition: A | Discharge status: Home / Self Care | Payer: Medicare PPO | Source: Ambulatory Visit | Attending: Radiation Oncology | Admitting: Radiation Oncology

## 2023-08-04 ENCOUNTER — Encounter: Payer: Self-pay | Admitting: Radiation Oncology

## 2023-08-04 VITALS — BP 102/67 | HR 106 | Temp 98.1°F | Resp 16 | Ht 64.0 in | Wt 187.0 lb

## 2023-08-04 DIAGNOSIS — C50912 Malignant neoplasm of unspecified site of left female breast: Secondary | ICD-10-CM | POA: Diagnosis not present

## 2023-08-04 DIAGNOSIS — C787 Secondary malignant neoplasm of liver and intrahepatic bile duct: Secondary | ICD-10-CM | POA: Insufficient documentation

## 2023-08-04 DIAGNOSIS — C7800 Secondary malignant neoplasm of unspecified lung: Secondary | ICD-10-CM | POA: Insufficient documentation

## 2023-08-04 DIAGNOSIS — C7951 Secondary malignant neoplasm of bone: Secondary | ICD-10-CM | POA: Insufficient documentation

## 2023-08-04 NOTE — Progress Notes (Signed)
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 female presents to the clinic today for reevaluation of a metastatic lesion of the right calvarium and patient previously treated to his cervical spine and multiple other sites for palliative radiation for stage IV breast cancer  REFERRING PROVIDER: Leim Fabry, MD  HPI: Patient is a 72 year old female well-known to our department having received radiation therapy for palliation to her thoracic spine as well as lumbar spine and cervical spine for stage IV metastatic breast cancer.  They have been following a lesion in the right calvarium which seems to be increasing over size on recent MRI scan..  No evidence of metastatic disease in the brain.  She had a CT scan also of chest abdomen pelvis back in September showing hypoattenuating liver metastasis stable since 524.  She also had evidence of parenchymal septal thickening stable over time but indicative of lymphangitic carcinomatosis.  She is really asymptomatic specifically denies any headaches or pain in her skull region.  COMPLICATIONS OF TREATMENT: none  FOLLOW UP COMPLIANCE: keeps appointments   PHYSICAL EXAM:  BP 102/67   Pulse (!) 106   Temp 98.1 F (36.7 C)   Resp 16   Ht 5\' 4"  (1.626 m)   Wt 187 lb (84.8 kg)   BMI 32.10 kg/m  D palpation of her calvarium does not elicit pain.  Well-developed well-nourished patient in NAD. HEENT reveals PERLA, EOMI, discs not visualized.  Oral cavity is clear. No oral mucosal lesions are identified. Neck is clear without evidence of cervical or supraclavicular adenopathy. Lungs are clear to A&P. Cardiac examination is essentially unremarkable with regular rate and rhythm without murmur rub or thrill. Abdomen is benign with no organomegaly or masses noted. Motor sensory and DTR levels are equal and symmetric in the upper  and lower extremities. Cranial nerves II through XII are grossly intact. Proprioception is intact. No peripheral adenopathy or edema is identified. No motor or sensory levels are noted. Crude visual fields are within normal range.  RADIOLOGY RESULTS: MRI reviewed compatible with above-stated findings  PLAN: At this time I have offered palliative radiation therapy to the right calvarium lesion.  Would plan on delivering 30 Gray in 5 fractions using SBRT treatment.  Risks and benefits of treatment including extremely of side effect profile including possible skin reaction and hair loss in that region were explained to the patient and her husband.  They comprehend the recommendations well.  I personally 7 ordered CT simulation for early next week.  I would like to take this opportunity to thank you for allowing me to participate in the care of your patient.Carmina Miller, MD

## 2023-08-05 ENCOUNTER — Other Ambulatory Visit: Payer: Self-pay

## 2023-08-08 ENCOUNTER — Other Ambulatory Visit: Payer: Self-pay | Admitting: Oncology

## 2023-08-10 ENCOUNTER — Other Ambulatory Visit: Payer: Self-pay | Admitting: Radiation Oncology

## 2023-08-10 DIAGNOSIS — C7951 Secondary malignant neoplasm of bone: Secondary | ICD-10-CM

## 2023-08-12 ENCOUNTER — Ambulatory Visit
Admission: RE | Admit: 2023-08-12 | Discharge: 2023-08-12 | Disposition: A | Discharge status: Home / Self Care | Payer: Medicare PPO | Source: Ambulatory Visit | Attending: Radiation Oncology | Admitting: Radiation Oncology

## 2023-08-12 DIAGNOSIS — C50919 Malignant neoplasm of unspecified site of unspecified female breast: Secondary | ICD-10-CM | POA: Diagnosis not present

## 2023-08-12 DIAGNOSIS — C7951 Secondary malignant neoplasm of bone: Secondary | ICD-10-CM | POA: Diagnosis not present

## 2023-08-12 DIAGNOSIS — Z923 Personal history of irradiation: Secondary | ICD-10-CM | POA: Insufficient documentation

## 2023-08-12 DIAGNOSIS — C50912 Malignant neoplasm of unspecified site of left female breast: Secondary | ICD-10-CM | POA: Insufficient documentation

## 2023-08-12 DIAGNOSIS — Z510 Encounter for antineoplastic radiation therapy: Secondary | ICD-10-CM | POA: Insufficient documentation

## 2023-08-13 ENCOUNTER — Other Ambulatory Visit: Payer: Self-pay | Admitting: Oncology

## 2023-08-13 DIAGNOSIS — C50919 Malignant neoplasm of unspecified site of unspecified female breast: Secondary | ICD-10-CM

## 2023-08-13 NOTE — Telephone Encounter (Signed)
Component Ref Range & Units 4 wk ago (07/16/23) 1 mo ago (07/09/23) 1 mo ago (06/21/23) 3 mo ago (04/22/23) 5 mo ago (02/18/23) 7 mo ago (12/29/22) 8 mo ago (12/01/22)  WBC Count 4.0 - 10.5 K/uL 4.3 4.6 4.5 4.0 4.2 4.2 4.0  RBC 3.87 - 5.11 MIL/uL 3.50 Low  3.54 Low  3.40 Low  3.39 Low  3.50 Low  3.43 Low  3.53 Low   Hemoglobin 12.0 - 15.0 g/dL 16.1 Low  09.6 Low  04.5 Low  10.8 Low  11.3 Low  11.0 Low  11.3 Low   HCT 36.0 - 46.0 % 34.1 Low  34.1 Low  33.1 Low  33.1 Low  34.2 Low  33.7 Low  34.3 Low   MCV 80.0 - 100.0 fL 97.4 96.3 97.4 97.6 97.7 98.3 97.2  MCH 26.0 - 34.0 pg 32.0 31.9 31.2 31.9 32.3 32.1 32.0  MCHC 30.0 - 36.0 g/dL 40.9 81.1 91.4 78.2 95.6 32.6 32.9  RDW 11.5 - 15.5 % 14.8 14.9 15.0 14.2 14.3 14.6 14.6  Platelet Count 150 - 400 K/uL 168 195 185 174 187 197 180  nRBC 0.0 - 0.2 % 0.0 0.0 0.0 0.0 0.0 0.0 0.0  Neutrophils Relative % % 67 71 72 71 68 70 71  Neutro Abs 1.7 - 7.7 K/uL 2.9 3.2 3.3 2.9 2.9 3.0 2.9  Lymphocytes Relative % 19 18 16 16 18 18 17   Lymphs Abs 0.7 - 4.0 K/uL 0.8 0.8 0.7 0.7 0.7 0.8 0.7  Monocytes Relative % 9 8 9 9 10 8 8   Monocytes Absolute 0.1 - 1.0 K/uL 0.4 0.4 0.4 0.4 0.4 0.3 0.3  Eosinophils Relative % 2 2 2 2 2 2 2   Eosinophils Absolute 0.0 - 0.5 K/uL 0.1 0.1 0.1 0.1 0.1 0.1 0.1  Basophils Relative % 1 1 1 1 1 1 1   Basophils Absolute 0.0 - 0.1 K/uL 0.0 0.0 0.0 0.0 0.1 0.0 0.0  Immature Granulocytes % 2 0 0 1 1 1 1   Abs Immature Granulocytes 0.00 - 0.07 K/uL 0.07 0.01 CM 0.02 CM 0.02 CM 0.02 CM 0.03 CM 0.02 CM  Comment: Performed at [facility], [address] [place], [place] [address]  Resulting Agency [facility]         Specimen Collected: 07/16/23 09:30 Last Resulted: 07/16/23 09:59     Component Ref Range & Units 4 wk ago 1 mo ago 3 mo ago 5 mo ago 7 mo ago 8 mo ago 9 mo ago  CA 27.29 0.0 - 38.6 U/mL 62.2 High  53.6 High  CM 44.8 High   CM 57.2 High  CM 35.5 CM     Component Ref Range & Units 4 wk ago 1 mo ago 3 mo ago 5 mo ago 7 mo ago 8 mo ago 9 mo ago  CA 15-3 0.0 - 25.0 U/mL 48.3 High  45.4 High  CM 39.1 High  CM 42.6 High  CM 30.7 High  CM 30.7 High  CM 28.8 High  CM   Component Ref Range & Units 4 wk ago (07/16/23) 1 mo ago (06/21/23) 1 mo ago (06/21/23) 3 mo ago (04/22/23) 5 mo ago (02/18/23) 7 mo ago (12/29/22) 8 mo ago (12/01/22)  Sodium 135 - 145 mmol/L 137  138 136 138 136 137  Potassium 3.5 - 5.1 mmol/L 3.6  3.5 3.8 3.9 3.7 3.8  Chloride 98 - 111 mmol/L 107  105 104 104 106 107  CO2 22 - 32 mmol/L 24  24 25 24 25 26   Glucose, Bld 70 - 99 mg/dL 528 High   413 High  CM 144 High  CM 102 High  CM 113 High  CM 124 High  CM  Comment: Glucose reference range applies only to samples taken after fasting for at least 8 hours.  BUN 8 - 23 mg/dL 18  17 20 16 19 14   Creatinine 0.44 - 1.00 mg/dL 2.44 High  0.10 High  2.72 High  1.05 High  1.17 High  1.02 High  1.01 High   Calcium 8.9 - 10.3 mg/dL 9.0  8.6 Low  9.1 9.2 8.9 8.9  Total Protein 6.5 - 8.1 g/dL 6.8  6.4 Low  6.7 7.0 6.5 6.5  Albumin 3.5 - 5.0 g/dL 3.9  3.6 3.8 4.1 3.7 3.7  AST 15 - 41 U/L 25  25 31 27 30 25   ALT 0 - 44 U/L 18  19 20 16 17 15   Alkaline Phosphatase 38 - 126 U/L 51  52 55 48 54 45  Total Bilirubin 0.3 - 1.2 mg/dL 1.2  0.8 0.6 1.1 0.8 1.0  GFR, Estimated >60 mL/min 57 Low   52 Low  CM 56 Low  CM 49 Low  CM 58 Low  CM   Comment: (NOTE) Calculated using the CKD-EPI Creatinine Equation (2021)  Anion gap 5 - 15 6  9  CM 7 CM 10 CM 5 CM 4 Low  CM  Comment: Performed at [facility], [address] [place], [place] [address]  Resulting Agency [facility]         Specimen Collected: 07/16/23 09:30 Last Resulted: 07/16/23 10:05

## 2023-08-19 ENCOUNTER — Encounter: Payer: Self-pay | Admitting: Oncology

## 2023-08-19 ENCOUNTER — Other Ambulatory Visit (HOSPITAL_COMMUNITY): Payer: Self-pay

## 2023-08-19 ENCOUNTER — Telehealth: Payer: Self-pay

## 2023-08-19 NOTE — Telephone Encounter (Addendum)
Oral Oncology Patient Advocate Encounter   **[employer] PAP to [facility] in Jan 2025**  Was successful in securing patient a $6,000.00 grant from [employer] to provide copayment coverage for [employer].  This will keep the out of pocket expense at $0.    I have spoken with the patient. We will be transitioning fills over to [facility] for 2025.   The billing information is as follows and has been shared with [facility].   Member ID: [id number] Group ID: [id number] RxBin: [id number] PCN: [id number] Dates of Eligibility: 08/10/23 through 08/09/24  Fund name:  Breast Cancer.   Ardeen Fillers, CPhT Oncology Pharmacy Patient Advocate  [facility]  [phone] (phone) [phone] (fax) 08/19/2023 1:33 PM

## 2023-08-20 ENCOUNTER — Other Ambulatory Visit: Payer: Self-pay | Admitting: Oncology

## 2023-08-23 ENCOUNTER — Encounter: Payer: Self-pay | Admitting: Oncology

## 2023-08-23 ENCOUNTER — Other Ambulatory Visit: Payer: Self-pay

## 2023-08-23 ENCOUNTER — Ambulatory Visit
Admission: RE | Admit: 2023-08-23 | Discharge: 2023-08-23 | Disposition: A | Discharge status: Home / Self Care | Payer: Medicare PPO | Source: Ambulatory Visit | Attending: Radiation Oncology | Admitting: Radiation Oncology

## 2023-08-23 DIAGNOSIS — Z510 Encounter for antineoplastic radiation therapy: Secondary | ICD-10-CM | POA: Diagnosis not present

## 2023-08-23 LAB — RAD ONC ARIA SESSION SUMMARY
Course Elapsed Days: 0
Plan Fractions Treated to Date: 1
Plan Prescribed Dose Per Fraction: 6 Gy
Plan Total Fractions Prescribed: 5
Plan Total Prescribed Dose: 30 Gy
Reference Point Dosage Given to Date: 6 Gy
Reference Point Session Dosage Given: 6 Gy
Session Number: 1

## 2023-08-25 ENCOUNTER — Ambulatory Visit
Admission: RE | Admit: 2023-08-25 | Discharge: 2023-08-25 | Disposition: A | Discharge status: Home / Self Care | Payer: Medicare PPO | Source: Ambulatory Visit | Attending: Radiation Oncology | Admitting: Radiation Oncology

## 2023-08-25 ENCOUNTER — Other Ambulatory Visit: Payer: Self-pay

## 2023-08-25 DIAGNOSIS — Z510 Encounter for antineoplastic radiation therapy: Secondary | ICD-10-CM | POA: Diagnosis not present

## 2023-08-25 LAB — RAD ONC ARIA SESSION SUMMARY
Course Elapsed Days: 2
Plan Fractions Treated to Date: 2
Plan Prescribed Dose Per Fraction: 6 Gy
Plan Total Fractions Prescribed: 5
Plan Total Prescribed Dose: 30 Gy
Reference Point Dosage Given to Date: 12 Gy
Reference Point Session Dosage Given: 6 Gy
Session Number: 2

## 2023-08-30 ENCOUNTER — Ambulatory Visit
Admission: RE | Admit: 2023-08-30 | Discharge: 2023-08-30 | Disposition: A | Discharge status: Home / Self Care | Payer: Medicare PPO | Source: Ambulatory Visit | Attending: Radiation Oncology | Admitting: Radiation Oncology

## 2023-08-30 ENCOUNTER — Other Ambulatory Visit: Payer: Self-pay

## 2023-08-30 DIAGNOSIS — C7951 Secondary malignant neoplasm of bone: Secondary | ICD-10-CM | POA: Diagnosis not present

## 2023-08-30 DIAGNOSIS — Z510 Encounter for antineoplastic radiation therapy: Secondary | ICD-10-CM | POA: Diagnosis present

## 2023-08-30 DIAGNOSIS — Z923 Personal history of irradiation: Secondary | ICD-10-CM | POA: Diagnosis not present

## 2023-08-30 DIAGNOSIS — C50912 Malignant neoplasm of unspecified site of left female breast: Secondary | ICD-10-CM | POA: Insufficient documentation

## 2023-08-30 DIAGNOSIS — C50919 Malignant neoplasm of unspecified site of unspecified female breast: Secondary | ICD-10-CM | POA: Diagnosis not present

## 2023-08-30 LAB — RAD ONC ARIA SESSION SUMMARY
Course Elapsed Days: 7
Plan Fractions Treated to Date: 3
Plan Prescribed Dose Per Fraction: 6 Gy
Plan Total Fractions Prescribed: 5
Plan Total Prescribed Dose: 30 Gy
Reference Point Dosage Given to Date: 18 Gy
Reference Point Session Dosage Given: 6 Gy
Session Number: 3

## 2023-09-01 ENCOUNTER — Ambulatory Visit
Admission: RE | Admit: 2023-09-01 | Discharge: 2023-09-01 | Disposition: A | Discharge status: Home / Self Care | Payer: Medicare PPO | Source: Ambulatory Visit | Attending: Radiation Oncology | Admitting: Radiation Oncology

## 2023-09-01 ENCOUNTER — Other Ambulatory Visit: Payer: Self-pay

## 2023-09-01 DIAGNOSIS — Z510 Encounter for antineoplastic radiation therapy: Secondary | ICD-10-CM | POA: Diagnosis not present

## 2023-09-01 LAB — RAD ONC ARIA SESSION SUMMARY
Course Elapsed Days: 9
Plan Fractions Treated to Date: 4
Plan Prescribed Dose Per Fraction: 6 Gy
Plan Total Fractions Prescribed: 5
Plan Total Prescribed Dose: 30 Gy
Reference Point Dosage Given to Date: 24 Gy
Reference Point Session Dosage Given: 6 Gy
Session Number: 4

## 2023-09-03 ENCOUNTER — Inpatient Hospital Stay: Payer: Medicare PPO | Attending: Oncology

## 2023-09-03 ENCOUNTER — Inpatient Hospital Stay: Payer: Medicare PPO | Admitting: Oncology

## 2023-09-03 ENCOUNTER — Encounter: Payer: Self-pay | Admitting: Oncology

## 2023-09-03 ENCOUNTER — Inpatient Hospital Stay: Payer: Medicare PPO

## 2023-09-03 VITALS — BP 137/74 | HR 89 | Temp 96.4°F | Resp 18 | Wt 185.5 lb

## 2023-09-03 VITALS — BP 127/69 | HR 88 | Temp 97.7°F | Resp 17

## 2023-09-03 DIAGNOSIS — C50919 Malignant neoplasm of unspecified site of unspecified female breast: Secondary | ICD-10-CM

## 2023-09-03 DIAGNOSIS — Z170 Estrogen receptor positive status [ER+]: Secondary | ICD-10-CM | POA: Diagnosis not present

## 2023-09-03 DIAGNOSIS — E785 Hyperlipidemia, unspecified: Secondary | ICD-10-CM

## 2023-09-03 DIAGNOSIS — Z79899 Other long term (current) drug therapy: Secondary | ICD-10-CM | POA: Insufficient documentation

## 2023-09-03 DIAGNOSIS — C7971 Secondary malignant neoplasm of right adrenal gland: Secondary | ICD-10-CM | POA: Diagnosis not present

## 2023-09-03 DIAGNOSIS — T451X5A Adverse effect of antineoplastic and immunosuppressive drugs, initial encounter: Secondary | ICD-10-CM

## 2023-09-03 DIAGNOSIS — Z87891 Personal history of nicotine dependence: Secondary | ICD-10-CM | POA: Diagnosis not present

## 2023-09-03 DIAGNOSIS — E7800 Pure hypercholesterolemia, unspecified: Secondary | ICD-10-CM | POA: Insufficient documentation

## 2023-09-03 DIAGNOSIS — C7951 Secondary malignant neoplasm of bone: Secondary | ICD-10-CM | POA: Insufficient documentation

## 2023-09-03 DIAGNOSIS — Z923 Personal history of irradiation: Secondary | ICD-10-CM | POA: Insufficient documentation

## 2023-09-03 DIAGNOSIS — K521 Toxic gastroenteritis and colitis: Secondary | ICD-10-CM | POA: Diagnosis not present

## 2023-09-03 DIAGNOSIS — Z79811 Long term (current) use of aromatase inhibitors: Secondary | ICD-10-CM | POA: Diagnosis not present

## 2023-09-03 DIAGNOSIS — C7972 Secondary malignant neoplasm of left adrenal gland: Secondary | ICD-10-CM | POA: Insufficient documentation

## 2023-09-03 DIAGNOSIS — Z5111 Encounter for antineoplastic chemotherapy: Secondary | ICD-10-CM | POA: Diagnosis not present

## 2023-09-03 DIAGNOSIS — C787 Secondary malignant neoplasm of liver and intrahepatic bile duct: Secondary | ICD-10-CM | POA: Insufficient documentation

## 2023-09-03 DIAGNOSIS — R739 Hyperglycemia, unspecified: Secondary | ICD-10-CM

## 2023-09-03 DIAGNOSIS — G620 Drug-induced polyneuropathy: Secondary | ICD-10-CM | POA: Diagnosis not present

## 2023-09-03 LAB — CMP (CANCER CENTER ONLY)
ALT: 19 U/L (ref 0–44)
AST: 26 U/L (ref 15–41)
Albumin: 3.9 g/dL (ref 3.5–5.0)
Alkaline Phosphatase: 44 U/L (ref 38–126)
Anion gap: 7 (ref 5–15)
BUN: 21 mg/dL (ref 8–23)
CO2: 24 mmol/L (ref 22–32)
Calcium: 9.3 mg/dL (ref 8.9–10.3)
Chloride: 106 mmol/L (ref 98–111)
Creatinine: 0.98 mg/dL (ref 0.44–1.00)
GFR, Estimated: >60 mL/min (ref >60)
Glucose, Bld: 94 mg/dL (ref 70–99)
Potassium: 4 mmol/L (ref 3.5–5.1)
Sodium: 137 mmol/L (ref 135–145)
Total Bilirubin: 1 mg/dL (ref <1.2)
Total Protein: 6.7 g/dL (ref 6.5–8.1)

## 2023-09-03 LAB — CBC WITH DIFFERENTIAL (CANCER CENTER ONLY)
Abs Immature Granulocytes: 0.02 10*3/uL (ref 0.00–0.07)
Basophils Absolute: 0 10*3/uL (ref 0.0–0.1)
Basophils Relative: 1 %
Eosinophils Absolute: 0.1 10*3/uL (ref 0.0–0.5)
Eosinophils Relative: 2 %
HCT: 32.1 % — ABNORMAL LOW (ref 36.0–46.0)
Hemoglobin: 10.8 g/dL — ABNORMAL LOW (ref 12.0–15.0)
Immature Granulocytes: 1 %
Lymphocytes Relative: 17 %
Lymphs Abs: 0.6 10*3/uL — ABNORMAL LOW (ref 0.7–4.0)
MCH: 33.3 pg (ref 26.0–34.0)
MCHC: 33.6 g/dL (ref 30.0–36.0)
MCV: 99.1 fL (ref 80.0–100.0)
Monocytes Absolute: 0.3 10*3/uL (ref 0.1–1.0)
Monocytes Relative: 9 %
Neutro Abs: 2.5 10*3/uL (ref 1.7–7.7)
Neutrophils Relative %: 70 %
Platelet Count: 192 10*3/uL (ref 150–400)
RBC: 3.24 MIL/uL — ABNORMAL LOW (ref 3.87–5.11)
RDW: 15.3 % (ref 11.5–15.5)
WBC Count: 3.5 10*3/uL — ABNORMAL LOW (ref 4.0–10.5)
nRBC: 0 % (ref 0.0–0.2)

## 2023-09-03 LAB — LIPID PANEL
Cholesterol: 135 mg/dL (ref 0–200)
HDL: 64 mg/dL (ref >40)
LDL Cholesterol: 55 mg/dL (ref 0–99)
Total CHOL/HDL Ratio: 2.1 {ratio}
Triglycerides: 82 mg/dL (ref <150)
VLDL: 16 mg/dL (ref 0–40)

## 2023-09-03 LAB — HEMOGLOBIN A1C
Hgb A1c MFr Bld: 5 % (ref 4.8–5.6)
Mean Plasma Glucose: 96.8 mg/dL

## 2023-09-03 MED ORDER — GABAPENTIN 300 MG PO CAPS: ORAL_CAPSULE | ORAL | 0 refills | Status: DC

## 2023-09-03 MED ORDER — ABEMACICLIB 150 MG PO TABS: 150.0000 mg | ORAL_TABLET | Freq: Two times a day (BID) | ORAL | 0 refills | Status: DC

## 2023-09-03 MED ORDER — HEPARIN SOD (PORK) LOCK FLUSH 100 UNIT/ML IV SOLN
500.0000 [IU] | Freq: Once | INTRAVENOUS | Status: AC
  Administered 2023-09-03: 500 [IU] via INTRAVENOUS
  Filled 2023-09-03: qty 5

## 2023-09-03 MED ORDER — ZOLEDRONIC ACID 4 MG/100ML IV SOLN
4.0000 mg | Freq: Once | INTRAVENOUS | Status: AC
  Administered 2023-09-03: 4 mg via INTRAVENOUS
  Filled 2023-09-03: qty 100

## 2023-09-03 NOTE — Assessment & Plan Note (Signed)
 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 Continue calcium and vitamin D supplementation. Marland Kitchen  MRI brain showed enlarging right frontal calvarium - refer to radonc for evaluation of RT  Zometa Q2 months

## 2023-09-03 NOTE — Assessment & Plan Note (Signed)
Chemotherapy plan as listed above 

## 2023-09-03 NOTE — Progress Notes (Signed)
Hematology/Oncology Progress note [phone] [phone]     CHIEF COMPLAINTS/REASON FOR VISIT:  metastatic breast caner ASSESSMENT & PLAN:   Cancer Staging  Primary malignant neoplasm of breast with metastasis (HCC) Staging form: Breast, AJCC 8th Edition - Clinical stage from 07/12/2021: Stage IV (rcTX, cNX, pM1, ER+, PR+, HER2-) - Signed by Rickard Patience, MD on 03/08/2022   Primary malignant neoplasm of breast with metastasis (HCC) Metastatic Breast cancer with extensive thoracic and bone involvement, in visceral crisis, ER+, PR+ HER2 neg Labs reviewed and discussed with patient. 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   CT- Stable radiographic disease.  Tumor marker is gradually increasing. Labs are reviewed and discussed with patient. Continue abemaciclib 150 mg twice daily + letrozole 2.5mg  daily. She tolerates well with manageable diarrhea  Chemotherapy induced diarrhea Stop colace. Imodium PRN  Chemotherapy-induced neuropathy (HCC) #Grade 2 neuropathy, continue gabapentin,  Continue gabapentin 600 mg in a.m., she lower the night time dose to 900 mg  Declined acupuncture    Encounter for antineoplastic chemotherapy Chemotherapy plan as listed above   Metastasis to bone [facility]) 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 Continue calcium and vitamin D supplementation. Marland Kitchen  MRI brain showed enlarging right frontal calvarium - refer to radonc for evaluation of RT  Zometa Q2 months     Orders Placed This Encounter  Procedures   Creatinine (Cancer Center Only)    Standing Status:   Future    Standing Expiration Date:   09/02/2024   CBC with Differential (Cancer Center Only)    Standing Status:   Future    Standing Expiration Date:   09/02/2024   CMP (Cancer Center only)     Standing Status:   Future    Standing Expiration Date:   09/02/2024   Cancer antigen 27.29    Standing Status:   Future    Standing Expiration Date:   09/02/2024   Cancer antigen 15-3    Standing Status:   Future    Standing Expiration Date:   09/02/2024   CBC with Differential (Cancer Center Only)    Standing Status:   Future    Standing Expiration Date:   09/02/2024   CMP (Cancer Center only)    Standing Status:   Future    Standing Expiration Date:   09/02/2024    Follow up per LOS All questions were answered. The patient knows to call the clinic with any problems, questions or concerns.  Rickard Patience, MD, PhD [facility] Hematology Oncology 09/03/2023      HISTORY OF PRESENTING ILLNESS:   Amber Blevins is a  72 y.o.  female with PMH listed below presents for follow up for treatment of metastatic breast cancer.   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 Additional metastatic involvement T12, L1 through L3.  No primary tumor site identified. Top differential considerations are Metastatic Disease Unknown primary, less likely Lymphoma or Multiple Myeloma.   2. Superimposed lumbar spine degeneration, including degenerative moderate to severe left L3 and L4 nerve level impingement from disc herniation.      07/02/2021 Imaging   CT chest abdomen pelvis showed innumerable  small pulmonary and pleural nodules consistent with diffuse metastasis.  Associated with probable malignant pleural effusion.  Mediastinal and hilar lymphadenopathy consistent with metastatic disease.  Hepatic and bilateral adrenal gland metastasis.  Diffuse extensive destructive metastatic bone disease involving pelvis.   07/12/2021 Cancer Staging   Staging form: Breast, AJCC 8th Edition - Clinical stage from 07/12/2021: Stage IV (rcTX, cNX, pM1, ER+, PR+, HER2-) - Signed by Rickard Patience, MD on 03/08/2022 Stage prefix: Recurrence   07/17/2021 Initial Diagnosis   Metastatic breast cancer -history of breast cancer, diagnosed in 2010 Left-sided T2 (2.4cm) N0, ER/PR positive, HER2 negative IDC of the breast, s/p lumpectomy by Dr.Meyer at [facility]. Oncotype score of 11 who completed radiation and she finished 10 years of Femara from 05/2009 and stopped in 2020.  -07/12/2021 patient underwent right thoracentesis.  Cytology was positive for metastatic carcinoma, compatible with breast origin.  ER/PR +, HER2 negative -07/16/2021, patient underwent iliac bone biopsy.  Positive for metastatic carcinoma.   07/21/2021 - 09/16/2021 Chemotherapy   Patient is on Treatment Plan : BREAST Paclitaxel D1,8,15 q28d     09/04/2021 Imaging   CT chest abdomen pelvis showed improvement of the pulmonary nodularity and thickening in the left and the right lung.  Persistent nodular interstitial and pleural thickening remains.  Improvement of mediastinal and hilar adenopathy, hepatic metastatic lesions are similar.  Adrenal gland metastasis is similar.  Interval increase in the sclerotic bone metastasis.    09/30/2021 -  Chemotherapy   started on abemaciclib 100 mg twice daily and letrozole.   02/24/2022 Imaging    CT chest abdomen pelvis with contrast showed unchanged bilateral pleural effusion with associated intralobular septal thickening of the lung bases.  Without discrete nodularity consistent with stable appearance of treated  pleural and lymphangitic metastatic disease.  Stable or minimally diminished subcentimeter liver lesions.  Unchanged left adrenal metastasis.  Unchanged widespread sclerotic osseous metastatic disease involving the included axial and appendicular skeleton.  No evidence of new metastatic disease in the chest abdomen or pelvis.  Coronary artery disease.   03/06/2022 Procedure   Ultrasound guided thoracentesis, removal of pleural fluid.    05/29/2022 Imaging   CT chest abdomen pelvis 1. Stable CT of the chest, abdomen and pelvis. 2. Unchanged appearance of bilateral pleural effusions, pleural nodularity, and interlobular septal thickening compatible with pleural and lymphangitic metastatic disease. 3. Stable appearance of multifocal sclerotic bone metastases. 4. Multifocal low-attenuation liver lesions are unchanged in the interval. 5. Stable appearance of left adrenal gland metastases. 6. Stable appearance of small pericardial effusion.7. No new or progressive disease identified.8. Aortic Atherosclerosis (ICD10-I70.0).   06/06/2022 Imaging   Bone scan Constellation of findings are consistent with multifocal osseous metastatic disease throughout the axial and appendicular skeleton.Of note, osseous metastatic disease involves the RIGHT greater than LEFT femur. Consider evaluation with dedicated hip and femur radiographs to evaluate for overall disease burden and potential risk for pathologic fracture.   08/31/2022 Imaging   CT chest abdomen pelvis w contrast 1. Unchanged small right, trace left pleural effusions with associated diffuse interlobular septal thickening and fine fissural and perilymphatic nodularity throughout  the lungs, findings consistent with lymphangitic metastatic disease. 2. Stable or slightly diminished very subtle hypodense lesion of the central right lobe of the liver, hepatic segment VIII measuring 0.6 cm, previously 0.8 cm. Additional unchanged, very subtle hypodense lesion  of hepatic segment IV B measuring 1.1 cm. 3. Unchanged widespread sclerotic osseous metastatic disease throughout the included axial and proximal appendicular skeleton, particularly dense in the lower lumbar spine and sacrum. 4. Unchanged left adrenal nodule measuring 1.7 x 1.1 cm. 5. Overall constellation of findings is consistent with stable metastatic disease. 6. Coronary artery disease.    12/01/2022 Miscellaneous   [facility] liquid biopsy - PIK3CA mutation. 0.1% Not enough tissue for tissue NGS   03/08/2023 Imaging   Bone scan showed Multifocal osseous metastatic disease. One new focus along the right side of the skull    Patient follows up with cardiology for evaluation of bradycardia.  Cardiology notes reviewed.  72-hour Holter monitor revealed revealed predominant sinus rhythm with mean heart rate of 96 bpm, heart rate range 53 to 129 bpm, frequent premature ventricular contractions (27% burden), and intermittent atrial fibrillation (5% burden) with longest episode 3 hours. CHA2DS2-VASc score of 3, per cardiology recommendation patient was started on Eliquis 5 mg twice daily.     INTERVAL HISTORY Amber Blevins is a 72 y.o. female who has above history reviewed by me today presents for follow up visit for metastatic breast cancer Occasional back and hip pain..  Denies any headache, nausea vomiting diarrhea.  Fever or chills. She reports feeling well.  Intermittent headache. She tolerates Abemaciclib 150mg  BID, manageable diarrhea  .  Review of Systems  Constitutional:  Negative for appetite change, chills, fatigue and fever.  HENT:   Negative for hearing loss and voice change.   Eyes:  Negative for eye problems.  Respiratory:  Negative for chest tightness, cough and shortness of breath.   Cardiovascular:  Negative for chest pain.  Gastrointestinal:  Negative for abdominal distention, abdominal pain and blood in stool.  Endocrine: Negative for hot flashes.  Genitourinary:   Negative for difficulty urinating and frequency.   Musculoskeletal:  Negative for arthralgias.  Skin:  Negative for itching and rash.  Neurological:  Positive for numbness. Negative for extremity weakness.  Hematological:  Negative for adenopathy.  Psychiatric/Behavioral:  Negative for confusion.      MEDICAL HISTORY:  Past Medical History:  Diagnosis Date   A-fib (HCC)    Breast cancer (HCC)    COPD (chronic obstructive pulmonary disease) (HCC)    Family history of adverse reaction to anesthesia    brother has problem with coming out of anesthesia   High cholesterol    Hypertension    Personal history of radiation therapy    Pre-diabetes     SURGICAL HISTORY: Past Surgical History:  Procedure Laterality Date   BREAST LUMPECTOMY Left    2010   COLONOSCOPY     EYE SURGERY Left    Cataract surgery   PORTACATH PLACEMENT N/A 08/01/2021   Procedure: INSERTION PORT-A-CATH;  Surgeon: Carolan Shiver, MD;  Location: [facility] ORS;  Service: General;  Laterality: N/A;   thorocentesis Right 07/10/2021   and another on 07/25/21    SOCIAL HISTORY: Social History   Socioeconomic History   Marital status: Married    Spouse name: Not on file   Number of children: Not on file   Years of education: Not on file   Highest education level: Not on file  Occupational History   Not on file  Tobacco Use   Smoking status: Former    Current packs/day: 0.00    Average packs/day: 1 pack/day for 25.0 years (25.0 ttl pk-yrs)    Types: Cigarettes    Start date: 38    Quit date: 43    Years since quitting: 34.9   Smokeless tobacco: Never  Vaping Use   Vaping status: Never Used  Substance and Sexual Activity   Alcohol use: Not Currently   Drug use: Never   Sexual activity: Not on file  Other Topics Concern   Not on file  Social History Narrative   Not on file   Social Determinants of Health   Financial Resource Strain: Low Risk  (07/26/2023)   Received from [facility]   Overall Financial Resource Strain (CARDIA)    Difficulty of Paying Living Expenses: Not hard at all  Food Insecurity: No Food Insecurity (07/26/2023)   Received from [facility]   Hunger Vital Sign    Worried About Running Out of Food in the Last Year: Never true    Ran Out of Food in the Last Year: Never true  Transportation Needs: No Transportation Needs (07/26/2023)   Received from [facility] - Transportation    In the past 12 months, has lack of transportation kept you from medical appointments or from getting medications?: No    Lack of Transportation (Non-Medical): No  Physical Activity: Not on file  Stress: Not on file  Social Connections: Not on file  Intimate Partner Violence: Not on file    FAMILY HISTORY: Family History  Problem Relation Age of Onset   Cancer Mother        gynecological   Lung cancer Mother    Diabetes Father    Heart disease Father    Parkinson's disease Father    Brain cancer Brother    Bladder Cancer Brother    Pulmonary disease Brother    Rheumatic fever Brother    Breast cancer Neg Hx     ALLERGIES:  is allergic to green tea (camellia sinensis), melaleuca viridiflora, tea tree oil, and lisinopril.  MEDICATIONS:  Current Outpatient Medications  Medication Sig Dispense Refill   acetaminophen (TYLENOL) 650 MG CR tablet Take 650 mg by mouth every 8 (eight) hours as needed for pain.     atorvastatin (LIPITOR) 40 MG tablet Take 40 mg by mouth at bedtime.     Boswellia-Glucosamine-Vit D (OSTEO BI-FLEX-GLUCOS/5-LOXIN) TABS Take 1 capsule by mouth in the morning and at bedtime.     Calcium Carb-Cholecalciferol 600-400 MG-UNIT TABS Take 1 tablet by mouth daily.     cyanocobalamin 1000 MCG tablet Take 1,000 mcg by mouth daily.     Docusate Sodium (DSS) 100 MG CAPS Take 1 capsule by mouth daily.     ELIQUIS 5 MG TABS tablet Take 5 mg by mouth 2 (two) times daily.     Iron-Vitamin C  (VITRON-C) 65-125 MG TABS Take by mouth.     letrozole (FEMARA) 2.5 MG tablet Take 1 tablet by mouth once daily 90 tablet 0   lidocaine-prilocaine (EMLA) cream Apply small amount to port and cover with saran wrap 1-2 hours prior to port access 30 g 5   LORazepam (ATIVAN) 0.5 MG tablet TAKE 1 TABLET BY MOUTH AT BEDTIME AS NEEDED FOR ANXIETY 30 tablet 0   losartan (COZAAR) 50 MG tablet Take 50 mg by mouth daily.     magnesium chloride (SLOW-MAG) 64 MG TBEC  SR tablet Take 1 tablet by mouth at bedtime.     melatonin (MELATONIN MAXIMUM STRENGTH) 5 MG TABS Take 1 tablet by mouth at bedtime.     promethazine-dextromethorphan (PROMETHAZINE-DM) 6.25-15 MG/5ML syrup Take 5 mLs by mouth 4 (four) times daily as needed. 118 mL 0   Turmeric (QC TUMERIC COMPLEX PO) Take 1 capsule by mouth at bedtime.     abemaciclib (VERZENIO) 150 MG tablet Take 1 tablet (150 mg total) by mouth 2 (two) times daily. 60 tablet 0   diphenoxylate-atropine (LOMOTIL) 2.5-0.025 MG tablet Take 1 tablet by mouth 4 (four) times daily as needed for diarrhea or loose stools. (Patient not taking: Reported on 12/01/2022) 30 tablet 0   gabapentin (NEURONTIN) 300 MG capsule TAKE 2 CAPSULES BY MOUTH IN THE MORNING AND 3 CAPSULES IN THE EVENING 450 capsule 0   Loperamide HCl (IMODIUM PO) Take by mouth. (Patient not taking: Reported on 12/01/2022)     ondansetron (ZOFRAN) 8 MG tablet Take by mouth every 8 (eight) hours as needed for nausea or vomiting. (Patient not taking: Reported on 11/02/2022)     prochlorperazine (COMPAZINE) 5 MG tablet Take 5 mg by mouth every 6 (six) hours as needed for nausea or vomiting. (Patient not taking: Reported on 12/01/2022)     No current facility-administered medications for this visit.   Facility-Administered Medications Ordered in Other Visits  Medication Dose Route Frequency Provider Last Rate Last Admin   sodium chloride flush (NS) 0.9 % injection 10 mL  10 mL Intravenous Once Rickard Patience, MD         PHYSICAL  EXAMINATION: ECOG PERFORMANCE STATUS: 1 - Symptomatic but completely ambulatory Vitals:   09/03/23 1017  BP: 137/74  Pulse: 89  Resp: 18  Temp: (!) 96.4 F (35.8 C)  SpO2: 100%   Filed Weights   09/03/23 1017  Weight: 185 lb 8 oz (84.1 kg)    Physical Exam Constitutional:      General: She is not in acute distress.    Appearance: She is not diaphoretic.  HENT:     Head: Normocephalic and atraumatic.     Nose: Nose normal.     Mouth/Throat:     Pharynx: No oropharyngeal exudate.  Eyes:     General: No scleral icterus.    Pupils: Pupils are equal, round, and reactive to light.  Cardiovascular:     Rate and Rhythm: Normal rate and regular rhythm.     Heart sounds: No murmur heard. Pulmonary:     Effort: Pulmonary effort is normal. No respiratory distress.     Comments: Decreased breath sound bilaterally. Abdominal:     General: There is no distension.     Palpations: Abdomen is soft.     Tenderness: There is no abdominal tenderness.  Musculoskeletal:        General: Normal range of motion.     Cervical back: Normal range of motion and neck supple.  Skin:    General: Skin is warm and dry.     Findings: No erythema.  Neurological:     Mental Status: She is alert and oriented to person, place, and time.     Cranial Nerves: No cranial nerve deficit.     Motor: No abnormal muscle tone.     Coordination: Coordination normal.  Psychiatric:        Mood and Affect: Affect normal.       LABORATORY DATA:  I have reviewed the data as listed     Latest  Ref Rng & Units 09/03/2023   10:04 AM 07/16/2023    9:30 AM 07/09/2023    9:50 AM  CBC  WBC 4.0 - 10.5 K/uL 3.5  4.3  4.6   Hemoglobin 12.0 - 15.0 g/dL 57.8  46.9  62.9   Hematocrit 36.0 - 46.0 % 32.1  34.1  34.1   Platelets 150 - 400 K/uL 192  168  195       Latest Ref Rng & Units 09/03/2023   10:04 AM 07/16/2023    9:30 AM 06/21/2023    9:18 AM  CMP  Glucose 70 - 99 mg/dL 94  528    BUN 8 - 23 mg/dL 21  18     Creatinine 4.13 - 1.00 mg/dL 2.44  0.10  2.72   Sodium 135 - 145 mmol/L 137  137    Potassium 3.5 - 5.1 mmol/L 4.0  3.6    Chloride 98 - 111 mmol/L 106  107    CO2 22 - 32 mmol/L 24  24    Calcium 8.9 - 10.3 mg/dL 9.3  9.0    Total Protein 6.5 - 8.1 g/dL 6.7  6.8    Total Bilirubin <1.2 mg/dL 1.0  1.2    Alkaline Phos 38 - 126 U/L 44  51    AST 15 - 41 U/L 26  25    ALT 0 - 44 U/L 19  18       RADIOGRAPHIC STUDIES: I have personally reviewed the radiological images as listed and agreed with the findings in the report. MR Brain W Wo Contrast  Result Date: 07/29/2023 CLINICAL DATA:  Breast cancer EXAM: MRI HEAD WITHOUT AND WITH CONTRAST TECHNIQUE: Multiplanar, multiecho pulse sequences of the brain and surrounding structures were obtained without and with intravenous contrast. CONTRAST:  9mL GADAVIST GADOBUTROL 1 MMOL/ML IV SOLN COMPARISON:  01/05/2023 FINDINGS: Brain: No restricted diffusion to suggest acute or subacute infarct. No abnormal parenchymal or meningeal enhancement. No acute hemorrhage, mass, mass effect, or midline shift. No hydrocephalus or extra-axial collection. Pituitary and craniocervical junction within normal limits. No hemosiderin deposition to suggest remote hemorrhage. Normal cerebral volume for age. Scattered T2 hyperintense signal in the periventricular white matter, likely the sequela of minimal chronic small vessel ischemic disease. Vascular: Normal arterial flow voids. Normal arterial and venous enhancement. Skull and upper cervical spine: Slight interval increase in the size of a previously noted enhancing lesion right frontal calvarium, which now measures to 1.7 x 0.4 x 1.6 cm (AP x TR x CC) (series 16, image 112 and series 17, image 22), previously up to 1.3 x 0.4 x 1.4 cm when remeasured similarly. No new enhancing lesions. Otherwise normal marrow signal. Sinuses/Orbits: Mucosal thickening in the maxillary sinuses with a left maxillary sinus mucous retention cyst.  Status post left lens replacement. No acute finding in the orbits. Other: The mastoid air cells are well aerated. IMPRESSION: 1. Slight interval increase in the size of a previously noted enhancing lesion in the right frontal calvarium, concerning for metastatic disease. No new enhancing osseous lesions. 2. No evidence of metastatic disease in the brain. Electronically Signed   By: Wiliam Ke M.D.   On: 07/29/2023 15:13   CT CHEST ABDOMEN PELVIS W CONTRAST  Result Date: 06/25/2023 CLINICAL DATA:  Breast cancer.  * Tracking Code: BO * EXAM: CT CHEST, ABDOMEN, AND PELVIS WITH CONTRAST TECHNIQUE: Multidetector CT imaging of the chest, abdomen and pelvis was performed following the standard protocol during bolus administration of  intravenous contrast. RADIATION DOSE REDUCTION: This exam was performed according to the departmental dose-optimization program which includes automated exposure control, adjustment of the mA and/or kV according to patient size and/or use of iterative reconstruction technique. CONTRAST:  OMNIPAQUE IOHEXOL 300 MG/ML  SOLN COMPARISON:  02/18/2023. FINDINGS: CT CHEST FINDINGS Cardiovascular: Right IJ Port-A-Cath terminates in the right atrium. Atherosclerotic calcification of the aorta and coronary arteries. Heart is enlarged. Pericardial thickening or small pericardial effusion. Mediastinum/Nodes: No pathologically enlarged mediastinal, hilar or axillary lymph nodes. Esophagus is grossly unremarkable. Lungs/Pleura: Mild basilar predominant septal thickening and slight fissural nodularity, similar to 02/18/2023. Small right and trace left pleural effusions. No pleural fluid. Airway is unremarkable. Musculoskeletal: Sclerotic lesions are seen throughout the visualized osseous structures, similar to 02/18/2023. Associated pathologic compression fractures in the thoracic spine, as before. CT ABDOMEN PELVIS FINDINGS Hepatobiliary: Liver is borderline enlarged, 18.0 cm. There are subtle  low-attenuation lesions in the liver, likely stable from 02/18/2023 when remeasured in a similar fashion. Gallbladder is unremarkable. No biliary ductal dilatation. Pancreas: Negative. Spleen: Negative. Adrenals/Urinary Tract: Adrenal glands are unremarkable. Left adrenal nodules measure up to 1.4 cm and 102 Hounsfield units with a relative washout of less than 40%, indeterminate. However, nodules are chronically stable favoring benign adenomas. No specific follow-up necessary other than on routine imaging. Bilateral renal sinus cysts. No specific follow-up necessary. Ureters are decompressed. Bladder is grossly unremarkable. Stomach/Bowel: Stomach, small bowel and colon are unremarkable. Appendix is not readily visualized. Vascular/Lymphatic: Atherosclerotic calcification of the aorta. No pathologically enlarged lymph nodes. Reproductive: Uterus is visualized.  No adnexal mass. Other: No free fluid. Similar mild haziness and subcentimeter nodularity in the small bowel mesentery. Mesenteries and peritoneum are otherwise unremarkable. Left hemidiaphragm is elevated. Small bilateral inguinal hernias contain fat. Musculoskeletal: Sclerotic lesions throughout the visualized osseous structures, as before. IMPRESSION: 1. Subtle hypoattenuating liver metastases, grossly stable from 02/18/2023. 2. Chronic basilar pulmonary parenchymal septal thickening and perifissural nodularity, stable and indicative of lymphangitic carcinomatosis. 3. Stable osseous metastatic disease. 4. Similar pericardial thickening or trace pericardial effusion. 5. Small right and trace left pleural effusions. 6. Borderline hepatomegaly. 7. Chronically stable left adrenal nodules, likely adenomas. 8. Aortic atherosclerosis (ICD10-I70.0). Coronary artery calcification Electronically Signed   By: Leanna Battles M.D.   On: 06/25/2023 09:02

## 2023-09-03 NOTE — Assessment & Plan Note (Addendum)
#  Grade 2 neuropathy, continue gabapentin,  Continue gabapentin 600 mg in a.m., she lower the night time dose to 900 mg  Declined acupuncture

## 2023-09-03 NOTE — Assessment & Plan Note (Signed)
 Stop colace. Imodium PRN

## 2023-09-03 NOTE — Assessment & Plan Note (Signed)
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   CT- Stable radiographic disease.  Tumor marker is gradually increasing. Labs are reviewed and discussed with patient. Continue abemaciclib 150 mg twice daily + letrozole 2.5mg  daily. She tolerates well with manageable diarrhea

## 2023-09-04 LAB — CANCER ANTIGEN 27.29: CA 27.29: 58.2 U/mL — ABNORMAL HIGH (ref 0.0–38.6)

## 2023-09-05 LAB — CANCER ANTIGEN 15-3: CA 15-3: 47.8 U/mL — ABNORMAL HIGH (ref 0.0–25.0)

## 2023-09-06 ENCOUNTER — Other Ambulatory Visit: Payer: Self-pay

## 2023-09-06 ENCOUNTER — Ambulatory Visit
Admission: RE | Admit: 2023-09-06 | Discharge: 2023-09-06 | Disposition: A | Discharge status: Home / Self Care | Payer: Medicare PPO | Source: Ambulatory Visit | Attending: Radiation Oncology | Admitting: Radiation Oncology

## 2023-09-06 DIAGNOSIS — Z510 Encounter for antineoplastic radiation therapy: Secondary | ICD-10-CM | POA: Diagnosis not present

## 2023-09-06 LAB — RAD ONC ARIA SESSION SUMMARY
Course Elapsed Days: 14
Plan Fractions Treated to Date: 5
Plan Prescribed Dose Per Fraction: 6 Gy
Plan Total Fractions Prescribed: 5
Plan Total Prescribed Dose: 30 Gy
Reference Point Dosage Given to Date: 30 Gy
Reference Point Session Dosage Given: 6 Gy
Session Number: 5

## 2023-09-07 NOTE — Radiation Completion Notes (Signed)
Patient Name: Amber Blevins, Amber Blevins MRN: [id number] Date of Birth: [birth year] Referring Physician: Leim Fabry, M.D. Date of Service: 2023-09-07 Radiation Oncologist: Carmina Miller, M.D. [facility]                             RADIATION ONCOLOGY END OF TREATMENT NOTE     Diagnosis: C79.51 Secondary malignant neoplasm of bone Staging on 2021-07-12: Primary malignant neoplasm of breast with metastasis (HCC) T=cTX, N=cNX, M=pM1 Intent: Palliative     HPI: Patient is a 72 year old female well-known to our department having received radiation therapy for palliation to her thoracic spine as well as lumbar spine and cervical spine for stage IV metastatic breast cancer.  They have been following a lesion in the right calvarium which seems to be increasing over size on recent MRI scan..  No evidence of metastatic disease in the brain.  She had a CT scan also of chest abdomen pelvis back in September showing hypoattenuating liver metastasis stable since 524.  She also had evidence of parenchymal septal thickening stable over time but indicative of lymphangitic carcinomatosis.  She is really asymptomatic specifically denies any headaches or pain in her skull region.      ==========DELIVERED PLANS==========  First Treatment Date: 2023-08-23 Last Treatment Date: 2023-09-06   Plan Name: [id number] Site: Cranium Technique: IMRT Mode: Photon Dose Per Fraction: 6 Gy Prescribed Dose (Delivered / Prescribed): 30 Gy / 30 Gy Prescribed Fxs (Delivered / Prescribed): 5 / 5     ==========ON TREATMENT VISIT DATES========== 2023-08-30     ==========UPCOMING VISITS========== 10/29/2023 [facility] INFUSION 2HR (120) [facility] INFUSION CHAIR 3  10/29/2023 [facility] EST PT 15 Rickard Patience, MD  10/29/2023 [facility] PORT FLUSH W/LAB [facility]  10/01/2023 [facility] PORT FLUSH W/LAB [facility] FLUSH  09/16/2023 [facility] PORT  FLUSH W/LAB [facility] FLUSH  09/16/2023 [facility] IM[facility]ING CT CHEST ABD/PELVIS W CONTRAST [facility]-OUTPATIENT        ==========APPENDIX - ON TREATMENT VISIT NOTES==========   See weekly On Treatment Notes in Epic for details in the Media tab (listed as Progress notes on the On Treatment Visit Dates listed above).

## 2023-09-13 ENCOUNTER — Other Ambulatory Visit: Payer: Self-pay | Admitting: Oncology

## 2023-09-13 DIAGNOSIS — C50919 Malignant neoplasm of unspecified site of unspecified female breast: Secondary | ICD-10-CM

## 2023-09-13 NOTE — Telephone Encounter (Signed)
CBC with Differential (Cancer Center Only) Order: [id number]  Status: Final result     Next appt: 09/16/2023 at 08:00 AM in Oncology ([facility]-PORT FLUSH)     Dx: Primary malignant neoplasm of breast ...   Test Result Released: Yes (seen)   1 Result Note          Component Ref Range & Units (hover) 10 d ago (09/03/23) 1 mo ago (07/16/23) 2 mo ago (07/09/23) 2 mo ago (06/21/23) 4 mo ago (04/22/23) 6 mo ago (02/18/23) 8 mo ago (12/29/22)  WBC Count 3.5 Low  4.3 4.6 4.5 4.0 4.2 4.2  RBC 3.24 Low  3.50 Low  3.54 Low  3.40 Low  3.39 Low  3.50 Low  3.43 Low   Hemoglobin 10.8 Low  11.2 Low  11.3 Low  10.6 Low  10.8 Low  11.3 Low  11.0 Low   HCT 32.1 Low  34.1 Low  34.1 Low  33.1 Low  33.1 Low  34.2 Low  33.7 Low   MCV 99.1 97.4 96.3 97.4 97.6 97.7 98.3  MCH 33.3 32.0 31.9 31.2 31.9 32.3 32.1  MCHC 33.6 32.8 33.1 32.0 32.6 33.0 32.6  RDW 15.3 14.8 14.9 15.0 14.2 14.3 14.6  Platelet Count 192 168 195 185 174 187 197  Comment: SPECIMEN CHECKED FOR CLOTS  nRBC 0.0 0.0 0.0 0.0 0.0 0.0 0.0  Neutrophils Relative % 70 67 71 72 71 68 70  Neutro Abs 2.5 2.9 3.2 3.3 2.9 2.9 3.0  Lymphocytes Relative 17 19 18 16 16 18 18   Lymphs Abs 0.6 Low  0.8 0.8 0.7 0.7 0.7 0.8  Monocytes Relative 9 9 8 9 9 10 8   Monocytes Absolute 0.3 0.4 0.4 0.4 0.4 0.4 0.3  Eosinophils Relative 2 2 2 2 2 2 2   Eosinophils Absolute 0.1 0.1 0.1 0.1 0.1 0.1 0.1  Basophils Relative 1 1 1 1 1 1 1   Basophils Absolute 0.0 0.0 0.0 0.0 0.0 0.1 0.0  Immature Granulocytes 1 2 0 0 1 1 1   Abs Immature Granulocytes 0.02 0.07 CM 0.01 CM 0.02 CM 0.02 CM 0.02 CM 0.03 CM  Comment: Performed at [facility], [address] [place], [place] [address]  Resulting Agency [facility] CL[facility] LAB [facility] CLIN LAB [facility] CLIN LAB CH CLIN LAB CH CLIN LAB [facility] CLIN LAB [facility] CLIN LAB        Specimen Collected: 09/03/23 10:04 Last Resulted: 09/03/23 10:27      Lab Flowsheet      Order Details      View Encounter      Lab and Collection Details      Routing       Result History    View All Conversations on this Encounter    CM=Additional comments    Result Care Coordination   Result Notes   Coralee Rud, RN 09/06/2023  9:08 AM EST Back to Top    Results forwarded to Emilio Aspen, PA   Patient Communication   Add Comments   Seen Back to Top   Other Results from 09/03/2023  CMP (Cancer Center only) Order: [id number]  Status: Final result     Next appt: 09/16/2023 at 08:00 AM in Oncology ([facility]PORT FLUSH)     Dx: Primary malignant neoplasm of breast ...     Test Result Released: Yes (seen)   1 Result Note          Component Ref Range & Units (hover) 10 d ago (09/03/23) 1 mo ago (  07/16/23) 2 mo ago (06/21/23) 2 mo ago (06/21/23) 4 mo ago (04/22/23) 6 mo ago (02/18/23) 8 mo ago (12/29/22)  Sodium 137 137  138 136 138 136  Potassium 4.0 3.6  3.5 3.8 3.9 3.7  Chloride 106 107  105 104 104 106  CO2 24 24  24 25 24 25   Glucose, Bld 94 134 High  CM  147 High  CM 144 High  CM 102 High  CM 113 High  CM  Comment: Glucose reference range applies only to samples taken after fasting for at least 8 hours.  BUN 21 18  17 20 16 19   Creatinine 0.98 1.04 High  1.10 High  1.11 High  1.05 High  1.17 High  1.02 High   Calcium 9.3 9.0  8.6 Low  9.1 9.2 8.9  Total Protein 6.7 6.8  6.4 Low  6.7 7.0 6.5  Albumin 3.9 3.9  3.6 3.8 4.1 3.7  AST 26 25  25 31 27 30   ALT 19 18  19 20 16 17   Alkaline Phosphatase 44 51  52 55 48 54  Total Bilirubin 1.0 1.2 R  0.8 R 0.6 R 1.1 R 0.8 R  GFR, Estimated >60 57 Low  CM  52 Low  CM 56 Low  CM 49 Low  CM 58 Low  CM  Comment: (NOTE) Calculated using the CKD-EPI Creatinine Equation (2021)  Anion gap 7 6 CM  9 CM 7 CM 10 CM 5 CM  Comment: Performed at [facility], [address] [place], [place] [address]  Resulting Agency CH CLIN LAB CH CLIN LAB CH CLIN LAB CH CLIN LAB CH CLIN LAB CH CLIN LAB CH CLIN LAB        Specimen Collected: 09/03/23 10:04 Last Resulted: 09/03/23 10:34      Lab Flowsheet       Order Details      View Encounter      Lab and Collection Details      Routing      Result History    View All Conversations on this Encounter    CM=Additional comments  R=Reference range differs from most recent result in table    Result Care Coordination   Result Notes   Coralee Rud, RN 09/06/2023  9:08 AM EST Back to Top    Results forwarded to Emilio Aspen, PA   Patient Communication   Add Comments   Seen Back to Top      Contains abnormal data Cancer antigen 27.29 Order: [id number]  Status: Final result     Next appt: 09/16/2023 at 08:00 AM in Oncology ([facility]PORT FLUSH)     Dx: Primary malignant neoplasm of breast ...     Test Result Released: Yes (seen)   0 Result Notes          Component Ref Range & Units (hover) 10 d ago 1 mo ago 2 mo ago 4 mo ago 6 mo ago 8 mo ago 9 mo ago  CA 27.29 58.2 High  62.2 High  CM 53.6 High  CM 44.8 High  CM 57.2 High  CM 35.5 CM 44.9 High  CM  Comment: (NOTE) Siemens Centaur Immunochemiluminometric Methodology (ICMA) Values obtained with different assay methods or kits cannot be used interchangeably. Results cannot be interpreted as absolute evidence of the presence or absence of malignant disease. Performed At: [facility] [address] [place], [place] [id number] Jolene Schimke MD AO:[id number]  Resulting Agency [facility] CLIN LAB [facility] CLIN  LAB CH CLIN LAB CH CLIN LAB CH CLIN LAB CH CLIN LAB CH CLIN LAB        Specimen Collected: 09/03/23 10:04 Last Resulted: 09/04/23 06:37      Lab Flowsheet      Order Details      View Encounter      Lab and Collection Details      Routing      Result History    View All Conversations on this Encounter    CM=Additional comments    Result Care Coordination   Patient Communication   Add Comments   Seen Back to Top      Contains abnormal data Cancer antigen 15-3 Order: [id number]  Status: Final result     Next appt: 09/16/2023 at 08:00 AM in Oncology  ([facility]-PORT FLUSH)     Dx: Primary malignant neoplasm of breast ...     Test Result Released: Yes (seen)   0 Result Notes          Component Ref Range & Units (hover) 10 d ago 1 mo ago 2 mo ago 4 mo ago 6 mo ago 8 mo ago 9 mo ago  CA 15-3 47.8 High  48.3 High  CM 45.4 High  CM 39.1 High  CM 42.6 High  CM 30.7 High  CM 30.7 High  CM  Comment: (NOTE) Roche Diagnostics Electrochemiluminescence Immunoassay (ECLIA) Values obtained with different assay methods or kits cannot be used interchangeably.  Results cannot be interpreted as absolute evidence of the presence or absence of malignant disease. Performed At: [facility] [address] [place], [place] [id number] Jolene Schimke MD XB:[id number]  Resulting Agency [facility] CLIN LAB [facility] CLIN LAB CH CLIN LAB CH CLIN LAB CH CLIN LAB CH CLIN LAB [facility] CLIN LAB        Specimen Collected: 09/03/23 10:04 Last Resulted: 09/05/23 14:35

## 2023-09-16 ENCOUNTER — Ambulatory Visit
Admission: RE | Admit: 2023-09-16 | Discharge: 2023-09-16 | Disposition: A | Discharge status: Home / Self Care | Payer: Medicare PPO | Source: Ambulatory Visit | Attending: Oncology | Admitting: Oncology

## 2023-09-16 ENCOUNTER — Inpatient Hospital Stay: Payer: Medicare PPO

## 2023-09-16 DIAGNOSIS — C50919 Malignant neoplasm of unspecified site of unspecified female breast: Secondary | ICD-10-CM | POA: Insufficient documentation

## 2023-09-16 DIAGNOSIS — M898X8 Other specified disorders of bone, other site: Secondary | ICD-10-CM | POA: Diagnosis not present

## 2023-09-16 DIAGNOSIS — R160 Hepatomegaly, not elsewhere classified: Secondary | ICD-10-CM | POA: Diagnosis not present

## 2023-09-16 DIAGNOSIS — E278 Other specified disorders of adrenal gland: Secondary | ICD-10-CM | POA: Diagnosis not present

## 2023-09-16 DIAGNOSIS — K5730 Diverticulosis of large intestine without perforation or abscess without bleeding: Secondary | ICD-10-CM | POA: Insufficient documentation

## 2023-09-16 DIAGNOSIS — J90 Pleural effusion, not elsewhere classified: Secondary | ICD-10-CM | POA: Diagnosis not present

## 2023-09-16 DIAGNOSIS — C799 Secondary malignant neoplasm of unspecified site: Secondary | ICD-10-CM | POA: Diagnosis not present

## 2023-09-16 DIAGNOSIS — C7951 Secondary malignant neoplasm of bone: Secondary | ICD-10-CM | POA: Diagnosis not present

## 2023-09-16 LAB — CREATININE (CANCER CENTER ONLY)
Creatinine: 1.15 mg/dL — ABNORMAL HIGH (ref 0.44–1.00)
GFR, Estimated: 50 mL/min — ABNORMAL LOW (ref >60)

## 2023-09-16 MED ORDER — IOHEXOL 300 MG/ML  SOLN
100.0000 mL | Freq: Once | INTRAMUSCULAR | Status: AC | PRN
  Administered 2023-09-16: 100 mL via INTRAVENOUS

## 2023-09-16 MED ORDER — HEPARIN SOD (PORK) LOCK FLUSH 100 UNIT/ML IV SOLN
500.0000 [IU] | Freq: Once | INTRAVENOUS | Status: DC
  Filled 2023-09-16: qty 5

## 2023-09-16 MED ORDER — HEPARIN SOD (PORK) LOCK FLUSH 100 UNIT/ML IV SOLN
500.0000 [IU] | Freq: Once | INTRAVENOUS | Status: AC
  Administered 2023-09-16: 500 [IU] via INTRAVENOUS
  Filled 2023-09-16: qty 5

## 2023-09-16 MED ORDER — SODIUM CHLORIDE 0.9% FLUSH
10.0000 mL | INTRAVENOUS | Status: DC | PRN
  Administered 2023-09-16: 10 mL via INTRAVENOUS
  Filled 2023-09-16: qty 10

## 2023-09-16 MED ORDER — HEPARIN SOD (PORK) LOCK FLUSH 100 UNIT/ML IV SOLN
INTRAVENOUS | Status: AC
  Filled 2023-09-16: qty 5

## 2023-09-27 ENCOUNTER — Other Ambulatory Visit: Payer: Self-pay | Admitting: Physician Assistant

## 2023-09-27 DIAGNOSIS — M8580 Other specified disorders of bone density and structure, unspecified site: Secondary | ICD-10-CM

## 2023-09-30 ENCOUNTER — Other Ambulatory Visit (HOSPITAL_COMMUNITY): Payer: Self-pay

## 2023-09-30 ENCOUNTER — Other Ambulatory Visit: Payer: Self-pay

## 2023-09-30 ENCOUNTER — Other Ambulatory Visit: Payer: Self-pay | Admitting: Pharmacist

## 2023-09-30 DIAGNOSIS — C50919 Malignant neoplasm of unspecified site of unspecified female breast: Secondary | ICD-10-CM

## 2023-09-30 MED ORDER — ABEMACICLIB 150 MG PO TABS
150.0000 mg | ORAL_TABLET | Freq: Two times a day (BID) | ORAL | 0 refills | Status: DC
  Filled 2023-09-30: qty 56, 28d supply, fill #0

## 2023-09-30 NOTE — Progress Notes (Signed)
 Specialty Pharmacy Initial Fill Coordination Note  Amber Blevins is a 73 y.o. female contacted today regarding initial fill of specialty medication(s) Abemaciclib  (VERZENIO )  Patient requested Delivery   Delivery date: 10/04/23   Verified address: [address] [place], [place] [address]  Medication will be filled on 10/01/23.   Patient is aware of $0.00 copayment. [facility] Secondary.    Morene Potters, CPhT Oncology Pharmacy Patient Advocate  [facility]  [phone] (phone) [phone] (fax) 09/30/2023 11:19 AM

## 2023-09-30 NOTE — Progress Notes (Signed)
 Patient switching to filling at [facility] (Specialty)

## 2023-09-30 NOTE — Telephone Encounter (Signed)
 Patient successfully OnBoarded. Medication scheduled to be shipped on Friday, 10/01/23, for delivery on Monday, 10/04/23, from [facility] to patient's address. Patient also knows to call me at [phone] with any questions or concerns regarding receiving medication or if there is any unexpected change in co-pay.    Morene Potters, CPhT Oncology Pharmacy Patient Advocate  [facility]  [phone] (phone) [phone] (fax) 09/30/2023 12:49 PM

## 2023-09-30 NOTE — Progress Notes (Signed)
 Specialty Pharmacy Initiation Note   Amber Blevins is a 73 y.o. female who will be followed by the specialty pharmacy service for RxSp Oncology    Review of administration, indication, effectiveness, safety, potential side effects, storage/disposable, and missed dose instructions occurred today for patient's specialty medication(s) Abemaciclib  (VERZENIO )     Patient/Caregiver did not have any additional questions or concerns.   No data recorded   Goals Addressed   None     Hermen Mario N Darrik Richman Specialty Pharmacist

## 2023-10-01 ENCOUNTER — Other Ambulatory Visit: Payer: Self-pay

## 2023-10-01 ENCOUNTER — Inpatient Hospital Stay: Payer: Medicare PPO | Attending: Oncology

## 2023-10-01 DIAGNOSIS — C787 Secondary malignant neoplasm of liver and intrahepatic bile duct: Secondary | ICD-10-CM | POA: Diagnosis not present

## 2023-10-01 DIAGNOSIS — Z170 Estrogen receptor positive status [ER+]: Secondary | ICD-10-CM | POA: Insufficient documentation

## 2023-10-01 DIAGNOSIS — C50919 Malignant neoplasm of unspecified site of unspecified female breast: Secondary | ICD-10-CM | POA: Insufficient documentation

## 2023-10-01 DIAGNOSIS — C7951 Secondary malignant neoplasm of bone: Secondary | ICD-10-CM | POA: Diagnosis present

## 2023-10-01 DIAGNOSIS — Z79811 Long term (current) use of aromatase inhibitors: Secondary | ICD-10-CM | POA: Insufficient documentation

## 2023-10-01 DIAGNOSIS — Z95828 Presence of other vascular implants and grafts: Secondary | ICD-10-CM

## 2023-10-01 DIAGNOSIS — C7972 Secondary malignant neoplasm of left adrenal gland: Secondary | ICD-10-CM | POA: Diagnosis not present

## 2023-10-01 DIAGNOSIS — C7971 Secondary malignant neoplasm of right adrenal gland: Secondary | ICD-10-CM | POA: Diagnosis not present

## 2023-10-01 DIAGNOSIS — Z87891 Personal history of nicotine dependence: Secondary | ICD-10-CM | POA: Insufficient documentation

## 2023-10-01 LAB — CBC WITH DIFFERENTIAL (CANCER CENTER ONLY)
Abs Immature Granulocytes: 0.01 10*3/uL (ref 0.00–0.07)
Basophils Absolute: 0 10*3/uL (ref 0.0–0.1)
Basophils Relative: 1 %
Eosinophils Absolute: 0.1 10*3/uL (ref 0.0–0.5)
Eosinophils Relative: 2 %
HCT: 30.7 % — ABNORMAL LOW (ref 36.0–46.0)
Hemoglobin: 10.4 g/dL — ABNORMAL LOW (ref 12.0–15.0)
Immature Granulocytes: 0 %
Lymphocytes Relative: 22 %
Lymphs Abs: 0.7 10*3/uL (ref 0.7–4.0)
MCH: 33.9 pg (ref 26.0–34.0)
MCHC: 33.9 g/dL (ref 30.0–36.0)
MCV: 100 fL (ref 80.0–100.0)
Monocytes Absolute: 0.3 10*3/uL (ref 0.1–1.0)
Monocytes Relative: 7 %
Neutro Abs: 2.3 10*3/uL (ref 1.7–7.7)
Neutrophils Relative %: 68 %
Platelet Count: 167 10*3/uL (ref 150–400)
RBC: 3.07 MIL/uL — ABNORMAL LOW (ref 3.87–5.11)
RDW: 14.8 % (ref 11.5–15.5)
Smear Review: NORMAL
WBC Count: 3.4 10*3/uL — ABNORMAL LOW (ref 4.0–10.5)
nRBC: 0 % (ref 0.0–0.2)

## 2023-10-01 LAB — CMP (CANCER CENTER ONLY)
ALT: 26 U/L (ref 0–44)
AST: 36 U/L (ref 15–41)
Albumin: 3.7 g/dL (ref 3.5–5.0)
Alkaline Phosphatase: 58 U/L (ref 38–126)
Anion gap: 7 (ref 5–15)
BUN: 24 mg/dL — ABNORMAL HIGH (ref 8–23)
CO2: 23 mmol/L (ref 22–32)
Calcium: 8.7 mg/dL — ABNORMAL LOW (ref 8.9–10.3)
Chloride: 107 mmol/L (ref 98–111)
Creatinine: 1.05 mg/dL — ABNORMAL HIGH (ref 0.44–1.00)
GFR, Estimated: 56 mL/min — ABNORMAL LOW (ref >60)
Glucose, Bld: 118 mg/dL — ABNORMAL HIGH (ref 70–99)
Potassium: 3.6 mmol/L (ref 3.5–5.1)
Sodium: 137 mmol/L (ref 135–145)
Total Bilirubin: 0.8 mg/dL (ref 0.0–1.2)
Total Protein: 6.2 g/dL — ABNORMAL LOW (ref 6.5–8.1)

## 2023-10-01 MED ORDER — HEPARIN SOD (PORK) LOCK FLUSH 100 UNIT/ML IV SOLN
500.0000 [IU] | Freq: Once | INTRAVENOUS | Status: AC
  Administered 2023-10-01: 500 [IU] via INTRAVENOUS
  Filled 2023-10-01: qty 5

## 2023-10-01 MED ORDER — SODIUM CHLORIDE 0.9% FLUSH
10.0000 mL | Freq: Once | INTRAVENOUS | Status: AC
Start: 2023-10-01 — End: 2023-10-01
  Administered 2023-10-01: 10 mL via INTRAVENOUS
  Filled 2023-10-01: qty 10

## 2023-10-06 ENCOUNTER — Encounter: Payer: Self-pay | Admitting: Radiation Oncology

## 2023-10-06 ENCOUNTER — Ambulatory Visit
Admission: RE | Admit: 2023-10-06 | Discharge: 2023-10-06 | Disposition: A | Discharge status: Home / Self Care | Payer: Medicare PPO | Source: Ambulatory Visit | Attending: Radiation Oncology | Admitting: Radiation Oncology

## 2023-10-06 VITALS — BP 121/79 | HR 98 | Temp 97.0°F | Resp 16 | Wt 184.6 lb

## 2023-10-06 DIAGNOSIS — C7951 Secondary malignant neoplasm of bone: Secondary | ICD-10-CM | POA: Diagnosis present

## 2023-10-06 NOTE — Progress Notes (Signed)
.  Radiation Oncology Follow up Note  Name: Amber Blevins   Date:   10/06/2023 MRN:  [id number] DOB: [birth year]    This 73 y.o. female presents to the clinic today for 1 month follow-up status post palliative radiation therapy for a metastatic lesion of the right calvarium patient with stage IV breast cancer.  REFERRING PROVIDER: Jyl Railing, MD  HPI: Patient is a 73 year old female now out 1 month having completed radiation therapy for a metastatic lesion of the right calvarium.  She previously been treated to her cervical spine and multiple other sites for palliation and patient with known stage IV breast cancer.  Seen today in follow-up she is doing well she is having no pain in her scalp region.  Other areas of her body including her spine are under good control she does have continued back pain although this has been ongoing for many years.  She is ambulating well.  She occasionally does take some Tylenol  for her back pain..  COMPLICATIONS OF TREATMENT: none  FOLLOW UP COMPLIANCE: keeps appointments   PHYSICAL EXAM:  BP 121/79   Pulse 98   Temp (!) 97 F (36.1 C) (Tympanic)   Resp 16   Wt 184 lb 9.6 oz (83.7 kg)   BMI 31.69 kg/m  Patient is some slight alopecia in the area of treatment of the scalp.  Deep palpation of her spine does not elicit pain.  Well-developed well-nourished patient in NAD. HEENT reveals PERLA, EOMI, discs not visualized.  Oral cavity is clear. No oral mucosal lesions are identified. Neck is clear without evidence of cervical or supraclavicular adenopathy. Lungs are clear to A&P. Cardiac examination is essentially unremarkable with regular rate and rhythm without murmur rub or thrill. Abdomen is benign with no organomegaly or masses noted. Motor sensory and DTR levels are equal and symmetric in the upper and lower extremities. Cranial nerves II through XII are grossly intact. Proprioception is intact. No peripheral adenopathy or edema is identified. No  motor or sensory levels are noted. Crude visual fields are within normal range.  RADIOLOGY RESULTS: No current films for review  PLAN: At this time I will turn follow-up care over to medical oncology.  I would be happy to reevaluate this patient anytime should further palliative treatment be indicated.  Patient knows to call with any concerns.  I would like to take this opportunity to thank you for allowing me to participate in the care of your patient.[name] [name], MD

## 2023-10-06 NOTE — Addendum Note (Signed)
 Encounter addended by: Vanita Panda, RN on: 10/06/2023 9:23 AM  Actions taken: Charge Capture section accepted

## 2023-10-06 NOTE — Addendum Note (Signed)
 Encounter addended by: Carmina Miller, MD on: 10/06/2023 12:51 PM  Actions taken: Clinical Note Signed, Level of Service modified

## 2023-10-12 ENCOUNTER — Ambulatory Visit
Admission: RE | Admit: 2023-10-12 | Discharge: 2023-10-12 | Disposition: A | Discharge status: Home / Self Care | Payer: Medicare PPO | Source: Ambulatory Visit | Attending: Physician Assistant | Admitting: Physician Assistant

## 2023-10-12 DIAGNOSIS — M85832 Other specified disorders of bone density and structure, left forearm: Secondary | ICD-10-CM | POA: Diagnosis not present

## 2023-10-12 DIAGNOSIS — Z780 Asymptomatic menopausal state: Secondary | ICD-10-CM | POA: Insufficient documentation

## 2023-10-12 DIAGNOSIS — Z853 Personal history of malignant neoplasm of breast: Secondary | ICD-10-CM | POA: Insufficient documentation

## 2023-10-12 DIAGNOSIS — Z13820 Encounter for screening for osteoporosis: Secondary | ICD-10-CM | POA: Insufficient documentation

## 2023-10-12 DIAGNOSIS — M8580 Other specified disorders of bone density and structure, unspecified site: Secondary | ICD-10-CM | POA: Insufficient documentation

## 2023-10-22 ENCOUNTER — Other Ambulatory Visit: Payer: Self-pay

## 2023-10-25 ENCOUNTER — Other Ambulatory Visit: Payer: Self-pay

## 2023-10-25 ENCOUNTER — Other Ambulatory Visit: Payer: Self-pay | Admitting: Oncology

## 2023-10-25 DIAGNOSIS — C50919 Malignant neoplasm of unspecified site of unspecified female breast: Secondary | ICD-10-CM

## 2023-10-25 NOTE — Progress Notes (Signed)
Specialty Pharmacy Refill Coordination Note  Amber Blevins is a 73 y.o. female contacted today regarding refills of specialty medication(s) Abemaciclib Amber Blevins)   Patient requested Delivery   Delivery date: 11/09/23   Verified address: [address]   Medication will be filled on 11/08/23. Pending refill request.

## 2023-10-25 NOTE — Progress Notes (Signed)
Specialty Pharmacy Ongoing Clinical Assessment Note  Amber Blevins is a 73 y.o. female who is being followed by the specialty pharmacy service for RxSp Oncology   Patient's specialty medication(s) reviewed today: Abemaciclib (VERZENIO)   Missed doses in the last 4 weeks: 0   Patient/Caregiver did not have any additional questions or concerns.   Therapeutic benefit summary: Unable to assess   Adverse events/side effects summary: No adverse events/side effects   Patient's therapy is appropriate to: Continue    Goals Addressed             This Visit's Progress    Stabilization of disease       Patient is on track. Patient will maintain adherence         Follow up:  3 months  Bobette Mo Specialty Pharmacist

## 2023-10-27 ENCOUNTER — Other Ambulatory Visit: Payer: Self-pay

## 2023-10-28 NOTE — Assessment & Plan Note (Addendum)
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   Tumor marker is gradually increasing. Labs are reviewed and discussed with patient. Continue abemaciclib 150 mg twice daily + letrozole 2.5mg  daily. She tolerates well with manageable diarrhea Obtain CT in March

## 2023-10-29 ENCOUNTER — Inpatient Hospital Stay: Payer: Medicare PPO | Admitting: Oncology

## 2023-10-29 ENCOUNTER — Inpatient Hospital Stay: Payer: Medicare PPO

## 2023-10-29 ENCOUNTER — Ambulatory Visit
Admission: RE | Admit: 2023-10-29 | Discharge: 2023-10-29 | Disposition: A | Discharge status: Home / Self Care | Payer: Medicare PPO | Source: Ambulatory Visit | Attending: Oncology

## 2023-10-29 ENCOUNTER — Ambulatory Visit
Admission: RE | Admit: 2023-10-29 | Discharge: 2023-10-29 | Disposition: A | Discharge status: Home / Self Care | Payer: Medicare PPO | Source: Ambulatory Visit | Attending: Oncology | Admitting: Oncology

## 2023-10-29 ENCOUNTER — Encounter: Payer: Self-pay | Admitting: Oncology

## 2023-10-29 VITALS — BP 128/58 | HR 87 | Temp 97.3°F | Resp 18 | Wt 187.0 lb

## 2023-10-29 DIAGNOSIS — K521 Toxic gastroenteritis and colitis: Secondary | ICD-10-CM | POA: Diagnosis not present

## 2023-10-29 DIAGNOSIS — C50919 Malignant neoplasm of unspecified site of unspecified female breast: Secondary | ICD-10-CM

## 2023-10-29 DIAGNOSIS — J90 Pleural effusion, not elsewhere classified: Secondary | ICD-10-CM

## 2023-10-29 DIAGNOSIS — T451X5A Adverse effect of antineoplastic and immunosuppressive drugs, initial encounter: Secondary | ICD-10-CM

## 2023-10-29 DIAGNOSIS — G620 Drug-induced polyneuropathy: Secondary | ICD-10-CM | POA: Diagnosis not present

## 2023-10-29 DIAGNOSIS — C7951 Secondary malignant neoplasm of bone: Secondary | ICD-10-CM | POA: Diagnosis not present

## 2023-10-29 DIAGNOSIS — Z5111 Encounter for antineoplastic chemotherapy: Secondary | ICD-10-CM

## 2023-10-29 LAB — CMP (CANCER CENTER ONLY)
ALT: 21 U/L (ref 0–44)
AST: 28 U/L (ref 15–41)
Albumin: 3.8 g/dL (ref 3.5–5.0)
Alkaline Phosphatase: 49 U/L (ref 38–126)
Anion gap: 8 (ref 5–15)
BUN: 23 mg/dL (ref 8–23)
CO2: 24 mmol/L (ref 22–32)
Calcium: 9.1 mg/dL (ref 8.9–10.3)
Chloride: 105 mmol/L (ref 98–111)
Creatinine: 1.07 mg/dL — ABNORMAL HIGH (ref 0.44–1.00)
GFR, Estimated: 55 mL/min — ABNORMAL LOW (ref >60)
Glucose, Bld: 126 mg/dL — ABNORMAL HIGH (ref 70–99)
Potassium: 3.7 mmol/L (ref 3.5–5.1)
Sodium: 137 mmol/L (ref 135–145)
Total Bilirubin: 1.1 mg/dL (ref 0.0–1.2)
Total Protein: 6.6 g/dL (ref 6.5–8.1)

## 2023-10-29 LAB — CBC WITH DIFFERENTIAL (CANCER CENTER ONLY)
Abs Immature Granulocytes: 0.02 10*3/uL (ref 0.00–0.07)
Basophils Absolute: 0 10*3/uL (ref 0.0–0.1)
Basophils Relative: 1 %
Eosinophils Absolute: 0.1 10*3/uL (ref 0.0–0.5)
Eosinophils Relative: 2 %
HCT: 31 % — ABNORMAL LOW (ref 36.0–46.0)
Hemoglobin: 10.4 g/dL — ABNORMAL LOW (ref 12.0–15.0)
Immature Granulocytes: 1 %
Lymphocytes Relative: 18 %
Lymphs Abs: 0.7 10*3/uL (ref 0.7–4.0)
MCH: 34 pg (ref 26.0–34.0)
MCHC: 33.5 g/dL (ref 30.0–36.0)
MCV: 101.3 fL — ABNORMAL HIGH (ref 80.0–100.0)
Monocytes Absolute: 0.4 10*3/uL (ref 0.1–1.0)
Monocytes Relative: 9 %
Neutro Abs: 2.8 10*3/uL (ref 1.7–7.7)
Neutrophils Relative %: 69 %
Platelet Count: 188 10*3/uL (ref 150–400)
RBC: 3.06 MIL/uL — ABNORMAL LOW (ref 3.87–5.11)
RDW: 14.3 % (ref 11.5–15.5)
WBC Count: 4 10*3/uL (ref 4.0–10.5)
nRBC: 0 % (ref 0.0–0.2)

## 2023-10-29 MED ORDER — HEPARIN SOD (PORK) LOCK FLUSH 100 UNIT/ML IV SOLN
250.0000 [IU] | Freq: Once | INTRAVENOUS | Status: AC | PRN
  Administered 2023-10-29: 500 [IU]
  Filled 2023-10-29: qty 5

## 2023-10-29 MED ORDER — SODIUM CHLORIDE 0.9 % IV SOLN
INTRAVENOUS | Status: DC
  Filled 2023-10-29 (×2): qty 250

## 2023-10-29 MED ORDER — ZOLEDRONIC ACID 4 MG/100ML IV SOLN
4.0000 mg | Freq: Once | INTRAVENOUS | Status: AC
  Administered 2023-10-29: 4 mg via INTRAVENOUS
  Filled 2023-10-29: qty 100

## 2023-10-29 MED ORDER — LETROZOLE 2.5 MG PO TABS: 2.5000 mg | ORAL_TABLET | Freq: Every day | ORAL | 1 refills | Status: DC

## 2023-10-29 NOTE — Progress Notes (Signed)
Hematology/Oncology Progress note [phone] Fax:([phone]     CHIEF COMPLAINTS/REASON FOR VISIT:  metastatic breast caner ASSESSMENT & PLAN:   Cancer Staging  Primary malignant neoplasm of breast with metastasis (HCC) Staging form: Breast, AJCC 8th Edition - Clinical stage from 07/12/2021: Stage IV (rcTX, cNX, pM1, ER+, PR+, HER2-) - Signed by Rickard Patience, MD on 03/08/2022   Primary malignant neoplasm of breast with metastasis (HCC) Metastatic Breast cancer with extensive thoracic and bone involvement, in visceral crisis, ER+, PR+ HER2 neg Labs reviewed and discussed with patient. 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   Tumor marker is gradually increasing. Labs are reviewed and discussed with patient. Continue abemaciclib 150 mg twice daily + letrozole 2.5mg  daily. She tolerates well with manageable diarrhea Obtain CT in March  Metastasis to bone [facility]) 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 Continue calcium and vitamin D supplementation. Marland Kitchen  MRI brain showed enlarging right frontal calvarium - s/p RT  Zometa Q2 months  Chemotherapy induced diarrhea Stop colace. Imodium PRN  Chemotherapy-induced neuropathy (HCC) #Grade 2 neuropathy, continue gabapentin,  Continue gabapentin 600 mg in a.m., she lower the night time dose to 900 mg  Declined acupuncture    Encounter for antineoplastic chemotherapy Chemotherapy plan as listed above   Bilateral pleural effusion Obtain CXR      Orders Placed This Encounter  Procedures   DG Chest 2 View    Standing Status:   Future    Number of Occurrences:   1    Expected Date:   10/29/2023    Expiration Date:   10/28/2024    Reason for Exam (SYMPTOM  OR DIAGNOSIS REQUIRED):   SOB    Preferred imaging location?:   [place]  [facility]   CT CHEST ABDOMEN PELVIS WO CONTRAST    Standing Status:   Future    Expected Date:   12/24/2023    Expiration Date:   10/28/2024    Preferred imaging location?:   [facility]    If indicated for the ordered procedure, I authorize the administration of oral contrast media per Radiology protocol:   Yes    Does the patient have a contrast media/X-ray dye allergy?:   No   Cancer antigen 15-3    Standing Status:   Future    Expected Date:   12/24/2023    Expiration Date:   10/28/2024   Cancer antigen 27.29    Standing Status:   Future    Expected Date:   12/24/2023    Expiration Date:   10/28/2024   CBC with Differential (Cancer Center Only)    Standing Status:   Future    Expected Date:   12/24/2023    Expiration Date:   10/28/2024   CMP (Cancer Center only)    Standing Status:   Future    Expected Date:   12/24/2023    Expiration Date:   10/28/2024   CBC with Differential (Cancer Center Only)    Standing Status:   Future    Expected Date:   11/26/2023    Expiration Date:   10/28/2024   CMP (Cancer Center only)    Standing Status:   Future    Expected Date:   11/26/2023    Expiration Date:   10/28/2024    Follow up per LOS All questions were answered. The patient  knows to call the clinic with any problems, questions or concerns.  Rickard Patience, MD, PhD [facility] Hematology Oncology 10/29/2023      HISTORY OF PRESENTING ILLNESS:   Amber Blevins is a  73 y.o.  female with PMH listed below presents for follow up for treatment of metastatic breast cancer.   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 Extensive malignant tumor replacing the bones of  the lower lumbar vertebrae (L4 and L5), visible sacrum, and pelvis. 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 Additional metastatic involvement T12, L1 through L3.  No primary tumor site identified. Top differential considerations are Metastatic Disease Unknown primary, less likely Lymphoma or Multiple Myeloma.   2. Superimposed lumbar spine degeneration, including degenerative moderate to severe left L3 and L4 nerve level impingement from disc herniation.      07/02/2021 Imaging   CT chest abdomen pelvis showed innumerable small pulmonary and pleural nodules consistent with diffuse metastasis.  Associated with probable malignant pleural effusion.  Mediastinal and hilar lymphadenopathy consistent with metastatic disease.  Hepatic and bilateral adrenal gland metastasis.  Diffuse extensive destructive metastatic bone disease involving pelvis.   07/12/2021 Cancer Staging   Staging form: Breast, AJCC 8th Edition - Clinical stage from 07/12/2021: Stage IV (rcTX, cNX, pM1, ER+, PR+, HER2-) - Signed by Rickard Patience, MD on 03/08/2022 Stage prefix: Recurrence   07/17/2021 Initial Diagnosis   Metastatic breast cancer -history of breast cancer, diagnosed in 2010 Left-sided T2 (2.4cm) N0, ER/PR positive, HER2 negative IDC of the breast, s/p lumpectomy by Dr.Meyer at [facility]. Oncotype score of 11 who completed radiation and she finished 10 years of Femara from 05/2009 and stopped in 2020.  -07/12/2021 patient underwent right thoracentesis.  Cytology was positive for metastatic carcinoma, compatible with breast origin.  ER/PR +, HER2 negative -07/16/2021, patient underwent iliac bone biopsy.  Positive for metastatic carcinoma.   07/21/2021 - 09/16/2021 Chemotherapy   Patient is on Treatment Plan : BREAST Paclitaxel D1,8,15 q28d     09/04/2021 Imaging   CT chest abdomen pelvis showed  improvement of the pulmonary nodularity and thickening in the left and the right lung.  Persistent nodular interstitial and pleural thickening remains.  Improvement of mediastinal and hilar adenopathy, hepatic metastatic lesions are similar.  Adrenal gland metastasis is similar.  Interval increase in the sclerotic bone metastasis.    09/30/2021 -  Chemotherapy   started on abemaciclib 100 mg twice daily and letrozole.   02/24/2022 Imaging    CT chest abdomen pelvis with contrast showed unchanged bilateral pleural effusion with associated intralobular septal thickening of the lung bases.  Without discrete nodularity consistent with stable appearance of treated pleural and lymphangitic metastatic disease.  Stable or minimally diminished subcentimeter liver lesions.  Unchanged left adrenal metastasis.  Unchanged widespread sclerotic osseous metastatic disease involving the included axial and appendicular skeleton.  No evidence of new metastatic disease in the chest abdomen or pelvis.  Coronary artery disease.   03/06/2022 Procedure   Ultrasound guided thoracentesis, removal of pleural fluid.    05/29/2022 Imaging  CT chest abdomen pelvis 1. Stable CT of the chest, abdomen and pelvis. 2. Unchanged appearance of bilateral pleural effusions, pleural nodularity, and interlobular septal thickening compatible with pleural and lymphangitic metastatic disease. 3. Stable appearance of multifocal sclerotic bone metastases. 4. Multifocal low-attenuation liver lesions are unchanged in the interval. 5. Stable appearance of left adrenal gland metastases. 6. Stable appearance of small pericardial effusion.7. No new or progressive disease identified.8. Aortic Atherosclerosis (ICD10-I70.0).   06/06/2022 Imaging   Bone scan Constellation of findings are consistent with multifocal osseous metastatic disease throughout the axial and appendicular skeleton.Of note, osseous metastatic disease involves the RIGHT greater than  LEFT femur. Consider evaluation with dedicated hip and femur radiographs to evaluate for overall disease burden and potential risk for pathologic fracture.   08/31/2022 Imaging   CT chest abdomen pelvis w contrast 1. Unchanged small right, trace left pleural effusions with associated diffuse interlobular septal thickening and fine fissural and perilymphatic nodularity throughout the lungs, findings consistent with lymphangitic metastatic disease. 2. Stable or slightly diminished very subtle hypodense lesion of the central right lobe of the liver, hepatic segment VIII measuring 0.6 cm, previously 0.8 cm. Additional unchanged, very subtle hypodense lesion of hepatic segment IV B measuring 1.1 cm. 3. Unchanged widespread sclerotic osseous metastatic disease throughout the included axial and proximal appendicular skeleton, particularly dense in the lower lumbar spine and sacrum. 4. Unchanged left adrenal nodule measuring 1.7 x 1.1 cm. 5. Overall constellation of findings is consistent with stable metastatic disease. 6. Coronary artery disease.    12/01/2022 Miscellaneous   [facility] liquid biopsy - PIK3CA mutation. 0.1% Not enough tissue for tissue NGS   03/08/2023 Imaging   Bone scan showed Multifocal osseous metastatic disease. One new focus along the right side of the skull   08/23/2023 - 09/06/2023 Radiation Therapy   Skull radiation.     Patient follows up with cardiology for evaluation of bradycardia.  Cardiology notes reviewed.  72-hour Holter monitor revealed revealed predominant sinus rhythm with mean heart rate of 96 bpm, heart rate range 53 to 129 bpm, frequent premature ventricular contractions (27% burden), and intermittent atrial fibrillation (5% burden) with longest episode 3 hours. CHA2DS2-VASc score of 3, per cardiology recommendation patient was started on Eliquis 5 mg twice daily.     INTERVAL HISTORY Amber Blevins is a 73 y.o. female who has above history reviewed by  me today presents for follow up visit for metastatic breast cancer Occasional back and hip pain..  Denies any headache, nausea vomiting diarrhea.  Fever or chills. She reports feeling well.  Sometimes she has SOB She tolerates Abemaciclib 150mg  BID, manageable diarrhea  .  Review of Systems  Constitutional:  Negative for appetite change, chills, fatigue and fever.  HENT:   Negative for hearing loss and voice change.   Eyes:  Negative for eye problems.  Respiratory:  Negative for chest tightness, cough and shortness of breath.   Cardiovascular:  Negative for chest pain.  Gastrointestinal:  Negative for abdominal distention, abdominal pain and blood in stool.  Endocrine: Negative for hot flashes.  Genitourinary:  Negative for difficulty urinating and frequency.   Musculoskeletal:  Negative for arthralgias.  Skin:  Negative for itching and rash.  Neurological:  Positive for numbness. Negative for extremity weakness.  Hematological:  Negative for adenopathy.  Psychiatric/Behavioral:  Negative for confusion.      MEDICAL HISTORY:  Past Medical History:  Diagnosis Date   A-fib [facility])    Breast cancer (HCC)  COPD (chronic obstructive pulmonary disease) (HCC)    Family history of adverse reaction to anesthesia    brother has problem with coming out of anesthesia   High cholesterol    Hypertension    Personal history of radiation therapy    Pre-diabetes     SURGICAL HISTORY: Past Surgical History:  Procedure Laterality Date   BREAST LUMPECTOMY Left    2010   COLONOSCOPY     EYE SURGERY Left    Cataract surgery   PORTACATH PLACEMENT N/A 08/01/2021   Procedure: INSERTION PORT-A-CATH;  Surgeon: Carolan Shiver, MD;  Location: [facility] ORS;  Service: General;  Laterality: N/A;   thorocentesis Right 07/10/2021   and another on 07/25/21    SOCIAL HISTORY: Social History   Socioeconomic History   Marital status: Married    Spouse name: Not on file   Number of children: Not  on file   Years of education: Not on file   Highest education level: Not on file  Occupational History   Not on file  Tobacco Use   Smoking status: Former    Current packs/day: 0.00    Average packs/day: 1 pack/day for 25.0 years (25.0 ttl pk-yrs)    Types: Cigarettes    Start date: 77    Quit date: 4    Years since quitting: 35.1   Smokeless tobacco: Never  Vaping Use   Vaping status: Never Used  Substance and Sexual Activity   Alcohol use: Not Currently   Drug use: Never   Sexual activity: Not on file  Other Topics Concern   Not on file  Social History Narrative   Not on file   Social Drivers of Health   Financial Resource Strain: Low Risk  (07/26/2023)   Received from [facility]   Overall Financial Resource Strain (CARDIA)    Difficulty of Paying Living Expenses: Not hard at all  Food Insecurity: No Food Insecurity (07/26/2023)   [facility]   Hunger Vital Sign    Worried About Running Out of Food in the Last Year: Never true    Ran Out of Food in the Last Year: Never true  Transportation Needs: No Transportation Needs (07/26/2023)   Received from [facility] - Transportation    In the past 12 months, has lack of transportation kept you from medical appointments or from getting medications?: No    Lack of Transportation (Non-Medical): No  Physical Activity: Not on file  Stress: Not on file  Social Connections: Not on file  Intimate Partner Violence: Not on file    FAMILY HISTORY: Family History  Problem Relation Age of Onset   Cancer Mother        gynecological   Lung cancer Mother    Diabetes Father    Heart disease Father    Parkinson's disease Father    Brain cancer Brother    Bladder Cancer Brother    Pulmonary disease Brother    Rheumatic fever Brother    Breast cancer Neg Hx     ALLERGIES:  is allergic to green tea (camellia sinensis), melaleuca viridiflora, tea  tree oil, and lisinopril.  MEDICATIONS:  Current Outpatient Medications  Medication Sig Dispense Refill   abemaciclib (VERZENIO) 150 MG tablet Take 1 tablet (150 mg total) by mouth 2 (two) times daily. 56 tablet 0   acetaminophen (TYLENOL) 650 MG CR tablet Take 650 mg by mouth every 8 (eight) hours as needed for  pain.     atorvastatin (LIPITOR) 40 MG tablet Take 40 mg by mouth at bedtime.     Boswellia-Glucosamine-Vit D (OSTEO BI-FLEX-GLUCOS/5-LOXIN) TABS Take 1 capsule by mouth in the morning and at bedtime.     Calcium Carb-Cholecalciferol 600-400 MG-UNIT TABS Take 1 tablet by mouth daily.     cyanocobalamin 1000 MCG tablet Take 1,000 mcg by mouth daily.     Docusate Sodium (DSS) 100 MG CAPS Take 1 capsule by mouth daily.     ELIQUIS 5 MG TABS tablet Take 5 mg by mouth 2 (two) times daily.     gabapentin (NEURONTIN) 300 MG capsule TAKE 2 CAPSULES BY MOUTH IN THE MORNING AND 3 CAPSULES IN THE EVENING 450 capsule 0   Iron-Vitamin C (VITRON-C) 65-125 MG TABS Take by mouth.     lidocaine-prilocaine (EMLA) cream Apply small amount to port and cover with saran wrap 1-2 hours prior to port access 30 g 5   Loperamide HCl (IMODIUM PO) Take by mouth.     LORazepam (ATIVAN) 0.5 MG tablet TAKE 1 TABLET BY MOUTH AT BEDTIME AS NEEDED FOR ANXIETY 30 tablet 0   losartan (COZAAR) 50 MG tablet Take 50 mg by mouth daily.     magnesium chloride (SLOW-MAG) 64 MG TBEC SR tablet Take 1 tablet by mouth at bedtime.     melatonin (MELATONIN MAXIMUM STRENGTH) 5 MG TABS Take 1 tablet by mouth at bedtime.     Turmeric (QC TUMERIC COMPLEX PO) Take 1 capsule by mouth at bedtime.     diphenoxylate-atropine (LOMOTIL) 2.5-0.025 MG tablet Take 1 tablet by mouth 4 (four) times daily as needed for diarrhea or loose stools. (Patient not taking: Reported on 12/01/2022) 30 tablet 0   letrozole (FEMARA) 2.5 MG tablet Take 1 tablet (2.5 mg total) by mouth daily. 90 tablet 1   ondansetron (ZOFRAN) 8 MG tablet Take by mouth every 8  (eight) hours as needed for nausea or vomiting. (Patient not taking: Reported on 11/02/2022)     prochlorperazine (COMPAZINE) 5 MG tablet Take 5 mg by mouth every 6 (six) hours as needed for nausea or vomiting. (Patient not taking: Reported on 12/01/2022)     promethazine-dextromethorphan (PROMETHAZINE-DM) 6.25-15 MG/5ML syrup Take 5 mLs by mouth 4 (four) times daily as needed. (Patient not taking: Reported on 10/29/2023) 118 mL 0   No current facility-administered medications for this visit.   Facility-Administered Medications Ordered in Other Visits  Medication Dose Route Frequency Provider Last Rate Last Admin   sodium chloride flush (NS) 0.9 % injection 10 mL  10 mL Intravenous Once Rickard Patience, MD         PHYSICAL EXAMINATION: ECOG PERFORMANCE STATUS: 1 - Symptomatic but completely ambulatory Vitals:   10/29/23 0945  BP: (!) 128/58  Pulse: 87  Resp: 18  Temp: (!) 97.3 F (36.3 C)  SpO2: 100%   Filed Weights   10/29/23 0945  Weight: 187 lb (84.8 kg)    Physical Exam Constitutional:      General: She is not in acute distress.    Appearance: She is not diaphoretic.  HENT:     Head: Normocephalic and atraumatic.     Nose: Nose normal.     Mouth/Throat:     Pharynx: No oropharyngeal exudate.  Eyes:     General: No scleral icterus.    Pupils: Pupils are equal, round, and reactive to light.  Cardiovascular:     Rate and Rhythm: Normal rate and regular rhythm.  Heart sounds: No murmur heard. Pulmonary:     Effort: Pulmonary effort is normal. No respiratory distress.     Comments: Mild decreased breath sound bilaterally.. L>R Abdominal:     General: There is no distension.     Palpations: Abdomen is soft.     Tenderness: There is no abdominal tenderness.  Musculoskeletal:        General: Normal range of motion.     Cervical back: Normal range of motion and neck supple.  Skin:    General: Skin is warm and dry.     Findings: No erythema.  Neurological:     Mental Status:  She is alert and oriented to person, place, and time.     Cranial Nerves: No cranial nerve deficit.     Motor: No abnormal muscle tone.     Coordination: Coordination normal.  Psychiatric:        Mood and Affect: Affect normal.       LABORATORY DATA:  I have reviewed the data as listed     Latest Ref Rng & Units 10/29/2023    9:33 AM 10/01/2023    9:53 AM 09/03/2023   10:04 AM  CBC  WBC 4.0 - 10.5 K/uL 4.0  3.4  3.5   Hemoglobin 12.0 - 15.0 g/dL 16.1  09.6  04.5   Hematocrit 36.0 - 46.0 % 31.0  30.7  32.1   Platelets 150 - 400 K/uL 188  167  192       Latest Ref Rng & Units 10/29/2023    9:33 AM 10/01/2023    9:53 AM 09/16/2023    7:58 AM  CMP  Glucose 70 - 99 mg/dL 409  811    BUN 8 - 23 mg/dL 23  24    Creatinine 9.14 - 1.00 mg/dL 7.82  9.56  2.13   Sodium 135 - 145 mmol/L 137  137    Potassium 3.5 - 5.1 mmol/L 3.7  3.6    Chloride 98 - 111 mmol/L 105  107    CO2 22 - 32 mmol/L 24  23    Calcium 8.9 - 10.3 mg/dL 9.1  8.7    Total Protein 6.5 - 8.1 g/dL 6.6  6.2    Total Bilirubin 0.0 - 1.2 mg/dL 1.1  0.8    Alkaline Phos 38 - 126 U/L 49  58    AST 15 - 41 U/L 28  36    ALT 0 - 44 U/L 21  26       RADIOGRAPHIC STUDIES: I have personally reviewed the radiological images as listed and agreed with the findings in the report. DG BONE DENSITY (DXA) Result Date: 10/12/2023 EXAM: DUAL X-RAY ABSORPTIOMETRY (DXA) FOR BONE MINERAL DENSITY IMPRESSION: Your patient Amber Blevins completed a BMD test on 10/12/2023 using the [employer] Advance DXA System (analysis version: 14.10) manufactured by [employer]. The following summarizes the results of our evaluation. Technologist: LCE PATIENT BIOGRAPHICAL: Name: Amber Blevins, Amber Blevins Patient ID: [id number] Birth Date: 1951/05/17 Height: 64.0 in. Gender: Female Exam Date: 10/12/2023 Weight: 185.4 lbs. Indications: Caucasian, History of Metastatic Breast Cancer, POSTmenopausal Fractures: Treatments: Calcium, Femara, Vitamin D, Zometa  ASSESSMENT: The BMD measured at Forearm Radius 33% is 0.749 g/cm2 with a T-score of -1.5. This patient is considered osteopenic according to World Health Organization [facility]) criteria. L-3 and L-4 was excluded due to degenerative changes/compression fracture, etc. FRAX not applicable due to current use of Zometa. The scan quality is good. Site Region Measured Measured  WHO Young Adult BMD Date       Age      Classification T-score AP Spine L1-L2 9/12/[age] Normal -0.8 1.067 g/cm2 DualFemur Neck Left 10/12/2023 73.9 Normal -0.6 0.951 g/cm2 Left Forearm Radius 33% 10[age]12/2023 [age] Osteopenia -1.5 0.749 g/cm2 World Health Organization [facility]) criteria for post-menopausal, Caucasian Women: Normal:       T-score at or above -1 SD Osteopenia:   T-score between -1 and -2.5 SD Osteoporosis: T-score at or below -2.5 SD RECOMMENDATIONS: 1. All patients should optimize calcium and vitamin D intake. 2. Consider FDA-approved medical therapies in postmenopausal women and men aged 55 years and older, based on the following: a. A hip or vertebral (clinical or morphometric) fracture b. T-score < -2.5 at the femoral neck or spine after appropriate evaluation to exclude secondary causes c. Low bone mass (T-score between -1.0 and -2.5 at the femoral neck or spine) and a 10-year probability of a hip fracture > 3% or a 10-year probability of a major osteoporosis-related fracture > 20% based on the [place]-adapted WHO algorithm d. Clinician judgment and/or patient preferences may indicate treatment for people with 10-year fracture probabilities above or below these levels FOLLOW-UP: People with diagnosed cases of osteoporosis or at high risk for fracture should have regular bone mineral density tests. For patients eligible for Medicare, routine testing is allowed once every 2 years. The testing frequency can be increased to one year for patients who have rapidly progressing disease, those who are receiving or discontinuing medical therapy to  restore bone mass, or have additional risk factors. I have reviewed this report, and agree with the above findings. [facility] Radiology Electronically Signed   By: Frederico Hamman M.D.   On: 10/12/2023 13:39   CT CHEST ABDOMEN PELVIS W CONTRAST Result Date: 09/16/2023 CLINICAL DATA:  Staging metastatic breast cancer. * Tracking Code: BO * EXAM: CT CHEST, ABDOMEN, AND PELVIS WITH CONTRAST TECHNIQUE: Multidetector CT imaging of the chest, abdomen and pelvis was performed following the standard protocol during bolus administration of intravenous contrast. RADIATION DOSE REDUCTION: This exam was performed according to the departmental dose-optimization program which includes automated exposure control, adjustment of the mA and/or kV according to patient size and/or use of iterative reconstruction technique. CONTRAST:  OMNIPAQUE IOHEXOL 300 MG/ML  SOLN COMPARISON:  CT scan 06/21/2023 and older. FINDINGS: CT CHEST FINDINGS Cardiovascular: Right IJ chest port in place with tip along the right atrium. Port is accessed. The thoracic aorta has a normal course and caliber with scattered vascular calcifications. Heart is nonenlarged. Trace pericardial fluid or thickening. Similar to previous. Mediastinum/Nodes: Thyroid gland is unremarkable. Slightly patulous thoracic esophagus. Surgical clips in the left axillary region. No specific abnormal lymph node enlargement present in the axillary region, hilum, internal mammary chain, supraclavicular or chest wall regions. Slight soft tissue thickening left paratracheal but no discrete mass or lymph node enlargement. There is some mild fluid along the mid mediastinum, unchanged. Lungs/Pleura: Small right and tiny left pleural effusion. Once again there is some mild septal thickening and slight fissural nodularity, similar to previous, predominantly in the lower lung zones. Areas of scarring and fibrotic changes as well. No discrete lung mass identified at this time  Musculoskeletal: Scattered sclerotic bone lesions identified along the thoracic spine, sternum, upper extremities, right clavicle and ribs. By CT the distribution is similar. If needed whole-body bone scan could be considered as clinically appropriate areas of vertebral body height loss again seen along the thoracic spine, similar to previous as  well. CT ABDOMEN PELVIS FINDINGS Hepatobiliary: Gallbladder is nondilated. Patent portal vein. Subtle liver lesions are again identified. Overall distribution is similar. Lesion in segment 4 which previously measured 19 x 13 mm, today measures 2.1 by 1.9 cm on series 2, image 54. More lateral lesion in the right hepatic lobe segment 8 is larger today 12 mm and previously 8 mm. These are also lower in density. Small focus in segment 4A as well. Pancreas: Unremarkable. No pancreatic ductal dilatation or surrounding inflammatory changes. Spleen: Normal in size without focal abnormality. Adrenals/Urinary Tract: Right adrenal gland is preserved. Stable left adrenal nodule measuring 14 mm. No enhancing renal mass. Bilateral parapelvic renal cysts identified, left-greater-than-right. Preserved contours of the urinary bladder. The ureters have normal course and caliber down to the bladder. Stomach/Bowel: Scattered colonic stool. Left-sided colonic diverticula. Fluid in the stomach. Small bowel is nondilated. Normal appendix. Vascular/Lymphatic: Aortic atherosclerosis. No enlarged abdominal or pelvic lymph nodes. Reproductive: Uterus and bilateral adnexa are unremarkable. Other: Stable mild mesenteric stranding, nonspecific. Musculoskeletal: Once again extensive sclerotic bone lesions along the pelvis particularly the sacrum. Also involvement of the lumbar spine, femurs. Again distribution is similar to previous. Bone scan could be considered as clinically appropriate to further delineate distribution as appropriate IMPRESSION: Multifocal metastatic disease again identified.  Extensive sclerotic bone lesions identified with a similar distribution compared to prior CT. Few liver lesions identified. Some of these larger today are lower in density. Persistent tiny pleural effusion on the left and small right with scattered septal thickening and perifissural nodularity. Stable left adrenal nodules.  Stable mesenteric stranding. Diffuse stool.  Left-sided colonic diverticula Electronically Signed   By: Karen Kays M.D.   On: 09/16/2023 11:30

## 2023-10-29 NOTE — Patient Instructions (Signed)

## 2023-10-29 NOTE — Assessment & Plan Note (Signed)
Obtain CXR 

## 2023-10-29 NOTE — Assessment & Plan Note (Addendum)
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 Continue calcium and vitamin D supplementation. Marland Kitchen  MRI brain showed enlarging right frontal calvarium - s/p RT  Zometa Q2 months

## 2023-10-29 NOTE — Assessment & Plan Note (Signed)
 Chemotherapy plan as listed above

## 2023-10-29 NOTE — Assessment & Plan Note (Signed)
#  Grade 2 neuropathy, continue gabapentin,  Continue gabapentin 600 mg in a.m., she lower the night time dose to 900 mg  Declined acupuncture

## 2023-10-29 NOTE — Assessment & Plan Note (Signed)
 Stop colace. Imodium PRN

## 2023-10-30 LAB — CANCER ANTIGEN 15-3: CA 15-3: 46.2 U/mL — ABNORMAL HIGH (ref 0.0–25.0)

## 2023-10-30 LAB — CANCER ANTIGEN 27.29: CA 27.29: 59.9 U/mL — ABNORMAL HIGH (ref 0.0–38.6)

## 2023-11-02 ENCOUNTER — Other Ambulatory Visit: Payer: Self-pay

## 2023-11-03 ENCOUNTER — Other Ambulatory Visit: Payer: Self-pay

## 2023-11-03 MED ORDER — ABEMACICLIB 150 MG PO TABS
150.0000 mg | ORAL_TABLET | Freq: Two times a day (BID) | ORAL | 0 refills | Status: DC
  Filled 2023-11-03: qty 56, 28d supply, fill #0

## 2023-11-08 ENCOUNTER — Other Ambulatory Visit: Payer: Self-pay

## 2023-11-09 ENCOUNTER — Encounter: Payer: Self-pay | Admitting: Oncology

## 2023-11-12 ENCOUNTER — Encounter: Payer: Self-pay | Admitting: Oncology

## 2023-11-25 ENCOUNTER — Other Ambulatory Visit: Payer: Self-pay

## 2023-11-26 ENCOUNTER — Inpatient Hospital Stay: Payer: Medicare PPO | Attending: Oncology

## 2023-11-26 ENCOUNTER — Other Ambulatory Visit: Payer: Self-pay

## 2023-11-26 ENCOUNTER — Other Ambulatory Visit: Payer: Self-pay | Admitting: Oncology

## 2023-11-26 DIAGNOSIS — C50919 Malignant neoplasm of unspecified site of unspecified female breast: Secondary | ICD-10-CM

## 2023-11-26 DIAGNOSIS — C7951 Secondary malignant neoplasm of bone: Secondary | ICD-10-CM | POA: Insufficient documentation

## 2023-11-26 LAB — CBC WITH DIFFERENTIAL (CANCER CENTER ONLY)
Abs Immature Granulocytes: 0.01 10*3/uL (ref 0.00–0.07)
Basophils Absolute: 0 10*3/uL (ref 0.0–0.1)
Basophils Relative: 1 %
Eosinophils Absolute: 0.1 10*3/uL (ref 0.0–0.5)
Eosinophils Relative: 3 %
HCT: 30.4 % — ABNORMAL LOW (ref 36.0–46.0)
Hemoglobin: 10.1 g/dL — ABNORMAL LOW (ref 12.0–15.0)
Immature Granulocytes: 0 %
Lymphocytes Relative: 17 %
Lymphs Abs: 0.5 10*3/uL — ABNORMAL LOW (ref 0.7–4.0)
MCH: 34.2 pg — ABNORMAL HIGH (ref 26.0–34.0)
MCHC: 33.2 g/dL (ref 30.0–36.0)
MCV: 103.1 fL — ABNORMAL HIGH (ref 80.0–100.0)
Monocytes Absolute: 0.3 10*3/uL (ref 0.1–1.0)
Monocytes Relative: 11 %
Neutro Abs: 2.2 10*3/uL (ref 1.7–7.7)
Neutrophils Relative %: 68 %
Platelet Count: 158 10*3/uL (ref 150–400)
RBC: 2.95 MIL/uL — ABNORMAL LOW (ref 3.87–5.11)
RDW: 14.2 % (ref 11.5–15.5)
WBC Count: 3.1 10*3/uL — ABNORMAL LOW (ref 4.0–10.5)
nRBC: 0 % (ref 0.0–0.2)

## 2023-11-26 LAB — CMP (CANCER CENTER ONLY)
ALT: 25 U/L (ref 0–44)
AST: 32 U/L (ref 15–41)
Albumin: 3.7 g/dL (ref 3.5–5.0)
Alkaline Phosphatase: 52 U/L (ref 38–126)
Anion gap: 7 (ref 5–15)
BUN: 18 mg/dL (ref 8–23)
CO2: 26 mmol/L (ref 22–32)
Calcium: 8.9 mg/dL (ref 8.9–10.3)
Chloride: 103 mmol/L (ref 98–111)
Creatinine: 1.15 mg/dL — ABNORMAL HIGH (ref 0.44–1.00)
GFR, Estimated: 50 mL/min — ABNORMAL LOW (ref >60)
Glucose, Bld: 127 mg/dL — ABNORMAL HIGH (ref 70–99)
Potassium: 3.8 mmol/L (ref 3.5–5.1)
Sodium: 136 mmol/L (ref 135–145)
Total Bilirubin: 1.1 mg/dL (ref 0.0–1.2)
Total Protein: 6.7 g/dL (ref 6.5–8.1)

## 2023-11-26 MED ORDER — SODIUM CHLORIDE 0.9% FLUSH
10.0000 mL | INTRAVENOUS | Status: DC | PRN
Start: 2023-11-26 — End: 2023-11-26
  Administered 2023-11-26: 10 mL via INTRAVENOUS
  Filled 2023-11-26: qty 10

## 2023-11-26 MED ORDER — ABEMACICLIB 150 MG PO TABS
150.0000 mg | ORAL_TABLET | Freq: Two times a day (BID) | ORAL | 0 refills | Status: DC
  Filled 2023-11-26: qty 56, 28d supply, fill #0

## 2023-11-26 MED ORDER — HEPARIN SOD (PORK) LOCK FLUSH 100 UNIT/ML IV SOLN
500.0000 [IU] | Freq: Once | INTRAVENOUS | Status: AC
Start: 2023-11-26 — End: 2023-11-26
  Administered 2023-11-26: 500 [IU] via INTRAVENOUS
  Filled 2023-11-26: qty 5

## 2023-11-26 NOTE — Progress Notes (Signed)
 Specialty Pharmacy Refill Coordination Note  Amber Blevins is a 73 y.o. female contacted today regarding refills of specialty medication(s) Abemaciclib [name])   Patient requested Delivery   Delivery date: 11/10/23   Verified address: [address]   Medication will be filled on 02.11.25.   This fill date is pending response to refill request from provider. Patient is aware and if they have not received fill by intended date they must follow up with pharmacy.

## 2023-12-17 ENCOUNTER — Other Ambulatory Visit: Payer: Self-pay | Admitting: Oncology

## 2023-12-17 ENCOUNTER — Ambulatory Visit
Admission: RE | Admit: 2023-12-17 | Discharge: 2023-12-17 | Disposition: A | Discharge status: Home / Self Care | Payer: Medicare PPO | Source: Ambulatory Visit | Attending: Oncology | Admitting: Oncology

## 2023-12-17 DIAGNOSIS — K5730 Diverticulosis of large intestine without perforation or abscess without bleeding: Secondary | ICD-10-CM | POA: Insufficient documentation

## 2023-12-17 DIAGNOSIS — R918 Other nonspecific abnormal finding of lung field: Secondary | ICD-10-CM | POA: Diagnosis not present

## 2023-12-17 DIAGNOSIS — I700 Atherosclerosis of aorta: Secondary | ICD-10-CM | POA: Diagnosis not present

## 2023-12-17 DIAGNOSIS — C50919 Malignant neoplasm of unspecified site of unspecified female breast: Secondary | ICD-10-CM | POA: Insufficient documentation

## 2023-12-17 DIAGNOSIS — E279 Disorder of adrenal gland, unspecified: Secondary | ICD-10-CM | POA: Diagnosis not present

## 2023-12-17 DIAGNOSIS — I3139 Other pericardial effusion (noninflammatory): Secondary | ICD-10-CM | POA: Insufficient documentation

## 2023-12-17 DIAGNOSIS — C7951 Secondary malignant neoplasm of bone: Secondary | ICD-10-CM | POA: Insufficient documentation

## 2023-12-17 DIAGNOSIS — C787 Secondary malignant neoplasm of liver and intrahepatic bile duct: Secondary | ICD-10-CM | POA: Insufficient documentation

## 2023-12-17 DIAGNOSIS — J90 Pleural effusion, not elsewhere classified: Secondary | ICD-10-CM | POA: Diagnosis not present

## 2023-12-24 ENCOUNTER — Inpatient Hospital Stay: Payer: Medicare PPO | Admitting: Oncology

## 2023-12-24 ENCOUNTER — Encounter: Payer: Self-pay | Admitting: Oncology

## 2023-12-24 ENCOUNTER — Inpatient Hospital Stay: Payer: Medicare PPO | Attending: Oncology

## 2023-12-24 ENCOUNTER — Inpatient Hospital Stay: Payer: Medicare PPO

## 2023-12-24 VITALS — BP 122/76 | HR 99 | Temp 98.0°F | Resp 18 | Ht 64.0 in | Wt 188.5 lb

## 2023-12-24 DIAGNOSIS — C7951 Secondary malignant neoplasm of bone: Secondary | ICD-10-CM

## 2023-12-24 DIAGNOSIS — Z79899 Other long term (current) drug therapy: Secondary | ICD-10-CM | POA: Insufficient documentation

## 2023-12-24 DIAGNOSIS — R001 Bradycardia, unspecified: Secondary | ICD-10-CM | POA: Diagnosis not present

## 2023-12-24 DIAGNOSIS — C787 Secondary malignant neoplasm of liver and intrahepatic bile duct: Secondary | ICD-10-CM | POA: Insufficient documentation

## 2023-12-24 DIAGNOSIS — Z79811 Long term (current) use of aromatase inhibitors: Secondary | ICD-10-CM | POA: Insufficient documentation

## 2023-12-24 DIAGNOSIS — Z1721 Progesterone receptor positive status: Secondary | ICD-10-CM | POA: Diagnosis not present

## 2023-12-24 DIAGNOSIS — K521 Toxic gastroenteritis and colitis: Secondary | ICD-10-CM | POA: Insufficient documentation

## 2023-12-24 DIAGNOSIS — T451X5A Adverse effect of antineoplastic and immunosuppressive drugs, initial encounter: Secondary | ICD-10-CM

## 2023-12-24 DIAGNOSIS — R911 Solitary pulmonary nodule: Secondary | ICD-10-CM | POA: Insufficient documentation

## 2023-12-24 DIAGNOSIS — C50919 Malignant neoplasm of unspecified site of unspecified female breast: Secondary | ICD-10-CM

## 2023-12-24 DIAGNOSIS — G620 Drug-induced polyneuropathy: Secondary | ICD-10-CM

## 2023-12-24 DIAGNOSIS — Z801 Family history of malignant neoplasm of trachea, bronchus and lung: Secondary | ICD-10-CM | POA: Diagnosis not present

## 2023-12-24 DIAGNOSIS — Z808 Family history of malignant neoplasm of other organs or systems: Secondary | ICD-10-CM | POA: Insufficient documentation

## 2023-12-24 DIAGNOSIS — Z923 Personal history of irradiation: Secondary | ICD-10-CM | POA: Diagnosis not present

## 2023-12-24 DIAGNOSIS — Z170 Estrogen receptor positive status [ER+]: Secondary | ICD-10-CM | POA: Diagnosis not present

## 2023-12-24 DIAGNOSIS — Z1732 Human epidermal growth factor receptor 2 negative status: Secondary | ICD-10-CM | POA: Diagnosis not present

## 2023-12-24 DIAGNOSIS — Z7901 Long term (current) use of anticoagulants: Secondary | ICD-10-CM | POA: Diagnosis not present

## 2023-12-24 DIAGNOSIS — Z8052 Family history of malignant neoplasm of bladder: Secondary | ICD-10-CM | POA: Diagnosis not present

## 2023-12-24 DIAGNOSIS — Z5111 Encounter for antineoplastic chemotherapy: Secondary | ICD-10-CM

## 2023-12-24 DIAGNOSIS — Z9221 Personal history of antineoplastic chemotherapy: Secondary | ICD-10-CM | POA: Diagnosis not present

## 2023-12-24 DIAGNOSIS — Z87891 Personal history of nicotine dependence: Secondary | ICD-10-CM | POA: Diagnosis not present

## 2023-12-24 DIAGNOSIS — C50912 Malignant neoplasm of unspecified site of left female breast: Secondary | ICD-10-CM | POA: Diagnosis not present

## 2023-12-24 LAB — CMP (CANCER CENTER ONLY)
ALT: 21 U/L (ref 0–44)
AST: 29 U/L (ref 15–41)
Albumin: 3.7 g/dL (ref 3.5–5.0)
Alkaline Phosphatase: 56 U/L (ref 38–126)
Anion gap: 8 (ref 5–15)
BUN: 27 mg/dL — ABNORMAL HIGH (ref 8–23)
CO2: 24 mmol/L (ref 22–32)
Calcium: 9.2 mg/dL (ref 8.9–10.3)
Chloride: 105 mmol/L (ref 98–111)
Creatinine: 1.06 mg/dL — ABNORMAL HIGH (ref 0.44–1.00)
GFR, Estimated: 55 mL/min — ABNORMAL LOW (ref >60)
Glucose, Bld: 115 mg/dL — ABNORMAL HIGH (ref 70–99)
Potassium: 4 mmol/L (ref 3.5–5.1)
Sodium: 137 mmol/L (ref 135–145)
Total Bilirubin: 0.8 mg/dL (ref 0.0–1.2)
Total Protein: 6.7 g/dL (ref 6.5–8.1)

## 2023-12-24 LAB — CBC WITH DIFFERENTIAL (CANCER CENTER ONLY)
Abs Immature Granulocytes: 0.02 10*3/uL (ref 0.00–0.07)
Basophils Absolute: 0 10*3/uL (ref 0.0–0.1)
Basophils Relative: 1 %
Eosinophils Absolute: 0.1 10*3/uL (ref 0.0–0.5)
Eosinophils Relative: 2 %
HCT: 32.3 % — ABNORMAL LOW (ref 36.0–46.0)
Hemoglobin: 10.6 g/dL — ABNORMAL LOW (ref 12.0–15.0)
Immature Granulocytes: 1 %
Lymphocytes Relative: 18 %
Lymphs Abs: 0.7 10*3/uL (ref 0.7–4.0)
MCH: 33.5 pg (ref 26.0–34.0)
MCHC: 32.8 g/dL (ref 30.0–36.0)
MCV: 102.2 fL — ABNORMAL HIGH (ref 80.0–100.0)
Monocytes Absolute: 0.3 10*3/uL (ref 0.1–1.0)
Monocytes Relative: 7 %
Neutro Abs: 2.9 10*3/uL (ref 1.7–7.7)
Neutrophils Relative %: 71 %
Platelet Count: 172 10*3/uL (ref 150–400)
RBC: 3.16 MIL/uL — ABNORMAL LOW (ref 3.87–5.11)
RDW: 13.6 % (ref 11.5–15.5)
WBC Count: 4 10*3/uL (ref 4.0–10.5)
nRBC: 0 % (ref 0.0–0.2)

## 2023-12-24 MED ORDER — HEPARIN SOD (PORK) LOCK FLUSH 100 UNIT/ML IV SOLN
500.0000 [IU] | Freq: Once | INTRAVENOUS | Status: AC
  Administered 2023-12-24: 500 [IU] via INTRAVENOUS
  Filled 2023-12-24: qty 5

## 2023-12-24 MED ORDER — ZOLEDRONIC ACID 4 MG/100ML IV SOLN
4.0000 mg | Freq: Once | INTRAVENOUS | Status: AC
  Administered 2023-12-24: 4 mg via INTRAVENOUS
  Filled 2023-12-24: qty 100

## 2023-12-24 NOTE — Assessment & Plan Note (Signed)
 Chemotherapy plan as listed above

## 2023-12-24 NOTE — Assessment & Plan Note (Signed)
 Stop colace. Imodium PRN

## 2023-12-24 NOTE — Assessment & Plan Note (Addendum)
 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 0.1%  07/21/2021 Taxol with treatment response.-->10/01/2023  abemaciclib + Letrozole Tumor marker is gradually increasing. Labs are reviewed and discussed with patient. Continue abemaciclib 150 mg twice daily + letrozole 2.5mg  daily. March 2025 CT showed liver lesion progression, new lung nodule. Will obtain liver lesion biopsy

## 2023-12-24 NOTE — Assessment & Plan Note (Signed)
 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 Continue calcium and vitamin D supplementation. Marland Kitchen  MRI brain showed enlarging right frontal calvarium - s/p RT  Zometa Q2 months

## 2023-12-24 NOTE — Progress Notes (Signed)
 Patient believes she has a sinus infection with symptoms nasal congestion.  Recently had a "chest cold" that was relieved with OTC Mucinex.

## 2023-12-24 NOTE — Patient Instructions (Signed)

## 2023-12-24 NOTE — Assessment & Plan Note (Signed)
#  Grade 2 neuropathy, continue gabapentin,  Continue gabapentin 600 mg in a.m., she lower the night time dose to 900 mg  Declined acupuncture

## 2023-12-24 NOTE — Progress Notes (Signed)
 Hematology/Oncology Progress note Telephone:([phone] [phone]     CHIEF COMPLAINTS/REASON FOR VISIT:  metastatic breast caner ASSESSMENT & PLAN:   Cancer Staging  Primary malignant neoplasm of breast with metastasis (HCC) Staging form: Breast, AJCC 8th Edition - Clinical stage from 07/12/2021: Stage IV (rcTX, cNX, pM1, ER+, PR+, HER2-) - Signed by Rickard Patience, MD on 03/08/2022   Primary malignant neoplasm of breast with metastasis (HCC) Metastatic Breast cancer with extensive thoracic and bone involvement, in visceral crisis, ER+, 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 0.1%  07/21/2021 Taxol with treatment response.-->10/01/2023  abemaciclib + Letrozole Tumor marker is gradually increasing. Labs are reviewed and discussed with patient. Continue abemaciclib 150 mg twice daily + letrozole 2.5mg  daily. March 2025 CT showed liver lesion progression, new lung nodule. Will obtain liver lesion biopsy    Chemotherapy induced diarrhea Stop colace. Imodium PRN  Chemotherapy-induced neuropathy (HCC) #Grade 2 neuropathy, continue gabapentin,  Continue gabapentin 600 mg in a.m., she lower the night time dose to 900 mg  Declined acupuncture    Encounter for antineoplastic chemotherapy Chemotherapy plan as listed above   Metastasis to bone [facility]) 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 Continue calcium and vitamin D supplementation. Marland Kitchen  MRI brain showed enlarging right frontal calvarium - s/p RT  Zometa Q2 months      Orders Placed This Encounter  Procedures   Cancer antigen 27.29    Standing Status:   Standing    Number of Occurrences:   2    Expiration Date:   12/23/2024   Cancer antigen 15-3    Standing Status:   Standing    Number of Occurrences:   2    Expiration Date:   12/23/2024   CMP (Cancer Center only)    Standing Status:   Standing    Number of  Occurrences:   2    Expiration Date:   12/23/2024   CBC with Differential (Cancer Center Only)    Standing Status:   Standing    Number of Occurrences:   2    Expiration Date:   12/23/2024    Follow up per LOS All questions were answered. The patient knows to call the clinic with any problems, questions or concerns.  Rickard Patience, MD, PhD [facility] Hematology Oncology 12/24/2023      HISTORY OF PRESENTING ILLNESS:   Amber Blevins is a  73 y.o.  female with PMH listed below presents for follow up for treatment of metastatic breast cancer.   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 Additional metastatic involvement T12, L1 through L3.  No primary tumor site identified. Top differential considerations are Metastatic Disease Unknown primary, less likely Lymphoma or Multiple Myeloma.   2. Superimposed lumbar spine degeneration, including degenerative moderate to severe left L3 and L4 nerve level impingement from disc  herniation.      07/02/2021 Imaging   CT chest abdomen pelvis showed innumerable small pulmonary and pleural nodules consistent with diffuse metastasis.  Associated with probable malignant pleural effusion.  Mediastinal and hilar lymphadenopathy consistent with metastatic disease.  Hepatic and bilateral adrenal gland metastasis.  Diffuse extensive destructive metastatic  bone disease involving pelvis.   07/12/2021 Cancer Staging   Staging form: Breast, AJCC 8th Edition - Clinical stage from 07/12/2021: Stage IV (rcTX, cNX, pM1, ER+, PR+, HER2-) - Signed by Rickard Patience, MD on 03/08/2022 Stage prefix: Recurrence   07/17/2021 Initial Diagnosis   Metastatic breast cancer -history of breast cancer, diagnosed in 2010 Left-sided T2 (2.4cm) N0, ER/PR positive, HER2 negative IDC of the breast, s/p lumpectomy by Dr.Meyer at [facility]. Oncotype score of 11 who completed radiation and she finished 10 years of Femara from 05/2009 and stopped in 2020.  -07/12/2021 patient underwent right thoracentesis.  Cytology was positive for metastatic carcinoma, compatible with breast origin.  ER/PR +, HER2 negative -07/16/2021, patient underwent iliac bone biopsy.  Positive for metastatic carcinoma.   07/21/2021 - 09/16/2021 Chemotherapy   Patient is on Treatment Plan : BREAST Paclitaxel D1,8,15 q28d     09/04/2021 Imaging   CT chest abdomen pelvis showed improvement of the pulmonary nodularity and thickening in the left and the right lung.  Persistent nodular interstitial and pleural thickening remains.  Improvement of mediastinal and hilar adenopathy, hepatic metastatic lesions are similar.  Adrenal gland metastasis is similar.  Interval increase in the sclerotic bone metastasis.    09/30/2021 -  Chemotherapy   started on abemaciclib 100 mg twice daily and letrozole.   02/24/2022 Imaging    CT chest abdomen pelvis with contrast showed unchanged bilateral pleural effusion with associated intralobular septal thickening of the lung bases.  Without discrete nodularity consistent with stable appearance of treated pleural and lymphangitic metastatic disease.  Stable or minimally diminished subcentimeter liver lesions.  Unchanged left adrenal metastasis.  Unchanged widespread sclerotic osseous metastatic disease involving the included axial and appendicular skeleton.  No evidence of new metastatic  disease in the chest abdomen or pelvis.  Coronary artery disease.   03/06/2022 Procedure   Ultrasound guided thoracentesis, removal of pleural fluid.    05/29/2022 Imaging   CT chest abdomen pelvis 1. Stable CT of the chest, abdomen and pelvis. 2. Unchanged appearance of bilateral pleural effusions, pleural nodularity, and interlobular septal thickening compatible with pleural and lymphangitic metastatic disease. 3. Stable appearance of multifocal sclerotic bone metastases. 4. Multifocal low-attenuation liver lesions are unchanged in the interval. 5. Stable appearance of left adrenal gland metastases. 6. Stable appearance of small pericardial effusion.7. No new or progressive disease identified.8. Aortic Atherosclerosis (ICD10-I70.0).   06/06/2022 Imaging   Bone scan Constellation of findings are consistent with multifocal osseous metastatic disease throughout the axial and appendicular skeleton.Of note, osseous metastatic disease involves the RIGHT greater than LEFT femur. Consider evaluation with dedicated hip and femur radiographs to evaluate for overall disease burden and potential risk for pathologic fracture.   08/31/2022 Imaging   CT chest abdomen pelvis w contrast 1. Unchanged small right, trace left pleural effusions with associated diffuse interlobular septal thickening and fine fissural and perilymphatic nodularity throughout the lungs, findings consistent with lymphangitic metastatic disease. 2. Stable or slightly diminished very subtle hypodense lesion of the central right lobe of the liver, hepatic segment VIII measuring 0.6 cm, previously 0.8 cm. Additional unchanged, very subtle hypodense lesion of hepatic segment IV B measuring 1.1 cm. 3.  Unchanged widespread sclerotic osseous metastatic disease throughout the included axial and proximal appendicular skeleton, particularly dense in the lower lumbar spine and sacrum. 4. Unchanged left adrenal nodule measuring 1.7 x 1.1  cm. 5. Overall constellation of findings is consistent with stable metastatic disease. 6. Coronary artery disease.    12/01/2022 Miscellaneous   [facility] liquid biopsy - PIK3CA mutation. 0.1% Not enough tissue for tissue NGS   03/08/2023 Imaging   Bone scan showed Multifocal osseous metastatic disease. One new focus along the right side of the skull   08/23/2023 - 09/06/2023 Radiation Therapy   Skull radiation.    12/24/2023 Imaging   CT chest abdomen pelvis wo contrast showed 1. 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 2. New indistinct 0.6 cm medial right lower lobe pulmonary nodule, indeterminate for inflammatory nodule versus pulmonary metastasis. Suggest attention on follow-up chest CT in 3 months. 3. Widespread patchy sclerotic osseous metastatic disease, not appreciably changed. 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 4. Stable small layering right pleural effusion. Stable small pericardial effusion. 5. Three-vessel coronary atherosclerosis. 6. Long-term stability of left adrenal nodule favoring an adenoma. 7. Moderate left colonic diverticulosis. 8.  Aortic Atherosclerosis (ICD10-I70.0).     Patient follows up with cardiology for evaluation of bradycardia.  Cardiology notes reviewed.  72-hour Holter monitor revealed revealed predominant sinus rhythm with mean heart rate of 96 bpm, heart rate range 53 to 129 bpm, frequent premature ventricular contractions (27% burden), and intermittent atrial fibrillation (5% burden) with longest episode 3 hours. CHA2DS2-VASc score of 3, per cardiology recommendation patient was started on Eliquis 5 mg twice daily.     INTERVAL HISTORY Amber Blevins is a 73 y.o. female who has above history reviewed by me today presents for follow up visit for metastatic breast cancer Occasional back and hip pain..  Denies any headache, nausea vomiting diarrhea.  Fever or chills. She tolerates Abemaciclib  150mg  BID, manageable diarrhea She came back from a trip.  + URI, cough has improved. Still has nasal congestion and sinus pressure. No fever or chills.   .  Review of Systems  Constitutional:  Negative for appetite change, chills, fatigue and fever.  HENT:   Negative for hearing loss and voice change.        Nasal congestion  Eyes:  Negative for eye problems.  Respiratory:  Positive for cough. Negative for chest tightness and shortness of breath.   Cardiovascular:  Negative for chest pain.  Gastrointestinal:  Negative for abdominal distention, abdominal pain and blood in stool.  Endocrine: Negative for hot flashes.  Genitourinary:  Negative for difficulty urinating and frequency.   Musculoskeletal:  Negative for arthralgias.  Skin:  Negative for itching and rash.  Neurological:  Positive for numbness. Negative for extremity weakness.  Hematological:  Negative for adenopathy.  Psychiatric/Behavioral:  Negative for confusion.      MEDICAL HISTORY:  Past Medical History:  Diagnosis Date   A-fib [facility])    Breast cancer (HCC)    COPD (chronic obstructive pulmonary disease) (HCC)    Family history of adverse reaction to anesthesia    brother has problem with coming out of anesthesia   High cholesterol    Hypertension    Personal history of radiation therapy    Pre-diabetes     SURGICAL HISTORY: Past Surgical History:  Procedure Laterality Date   BREAST LUMPECTOMY Left    2010   COLONOSCOPY     EYE SURGERY Left    Cataract surgery  PORTACATH PLACEMENT N/A 08/01/2021   Procedure: INSERTION PORT-A-CATH;  Surgeon: Carolan Shiver, MD;  Location: [facility] ORS;  Service: General;  Laterality: N/A;   thorocentesis Right 07/10/2021   and another on 07/25/21    SOCIAL HISTORY: Social History   Socioeconomic History   Marital status: Married    Spouse name: Not on file   Number of children: Not on file   Years of education: Not on file   Highest education level: Not on  file  Occupational History   Not on file  Tobacco Use   Smoking status: Former    Current packs/day: 0.00    Average packs/day: 1 pack/day for 25.0 years (25.0 ttl pk-yrs)    Types: Cigarettes    Start date: 6    Quit date: 56    Years since quitting: 35.2   Smokeless tobacco: Never  Vaping Use   Vaping status: Never Used  Substance and Sexual Activity   Alcohol use: Not Currently   Drug use: Never   Sexual activity: Not on file  Other Topics Concern   Not on file  Social History Narrative   Not on file   Social Drivers of Health   Financial Resource Strain: Low Risk  (07/26/2023)   Received from [facility]   Overall Financial Resource Strain (CARDIA)    Difficulty of Paying Living Expenses: Not hard at all  Food Insecurity: No Food Insecurity (07/26/2023)   Received from [facility]   Hunger Vital Sign    Worried About Running Out of Food in the Last Year: Never true    Ran Out of Food in the Last Year: Never true  Transportation Needs: No Transportation Needs (07/26/2023)   Received from [facility] - Transportation    In the past 12 months, has lack of transportation kept you from medical appointments or from getting medications?: No    Lack of Transportation (Non-Medical): No  Physical Activity: Not on file  Stress: Not on file  Social Connections: Not on file  Intimate Partner Violence: Not on file    FAMILY HISTORY: Family History  Problem Relation Age of Onset   Cancer Mother        gynecological   Lung cancer Mother    Diabetes Father    Heart disease Father    Parkinson's disease Father    Brain cancer Brother    Bladder Cancer Brother    Pulmonary disease Brother    Rheumatic fever Brother    Breast cancer Neg Hx     ALLERGIES:  is allergic to green tea (camellia sinensis), melaleuca viridiflora, tea tree oil, and lisinopril.  MEDICATIONS:  Current Outpatient Medications   Medication Sig Dispense Refill   abemaciclib (VERZENIO) 150 MG tablet Take 1 tablet (150 mg total) by mouth 2 (two) times daily. 56 tablet 0   acetaminophen (TYLENOL) 650 MG CR tablet Take 650 mg by mouth every 8 (eight) hours as needed for pain.     atorvastatin (LIPITOR) 40 MG tablet Take 40 mg by mouth at bedtime.     Boswellia-Glucosamine-Vit D (OSTEO BI-FLEX-GLUCOS/5-LOXIN) TABS Take 1 capsule by mouth in the morning and at bedtime.     Calcium Carb-Cholecalciferol 600-400 MG-UNIT TABS Take 1 tablet by mouth daily.     cyanocobalamin 1000 MCG tablet Take 1,000 mcg by mouth daily.     Docusate Sodium (DSS) 100 MG CAPS Take 1 capsule by mouth daily.  ELIQUIS 5 MG TABS tablet Take 5 mg by mouth 2 (two) times daily.     gabapentin (NEURONTIN) 300 MG capsule TAKE 2 CAPSULES BY MOUTH IN THE MORNING AND 3 CAPSULES IN THE EVENING 450 capsule 0   Iron-Vitamin C (VITRON-C) 65-125 MG TABS Take by mouth.     letrozole (FEMARA) 2.5 MG tablet Take 1 tablet (2.5 mg total) by mouth daily. 90 tablet 1   lidocaine-prilocaine (EMLA) cream Apply small amount to port and cover with saran wrap 1-2 hours prior to port access 30 g 5   Loperamide HCl (IMODIUM PO) Take by mouth.     LORazepam (ATIVAN) 0.5 MG tablet TAKE 1 TABLET BY MOUTH AT BEDTIME AS NEEDED FOR ANXIETY 30 tablet 0   losartan (COZAAR) 50 MG tablet Take 50 mg by mouth daily.     magnesium chloride (SLOW-MAG) 64 MG TBEC SR tablet Take 1 tablet by mouth at bedtime.     melatonin (MELATONIN MAXIMUM STRENGTH) 5 MG TABS Take 1 tablet by mouth at bedtime as needed.     Turmeric (QC TUMERIC COMPLEX PO) Take 1 capsule by mouth at bedtime.     diphenoxylate-atropine (LOMOTIL) 2.5-0.025 MG tablet Take 1 tablet by mouth 4 (four) times daily as needed for diarrhea or loose stools. (Patient not taking: Reported on 12/24/2023) 30 tablet 0   ondansetron (ZOFRAN) 8 MG tablet Take by mouth every 8 (eight) hours as needed for nausea or vomiting. (Patient not  taking: Reported on 12/24/2023)     prochlorperazine (COMPAZINE) 5 MG tablet Take 5 mg by mouth every 6 (six) hours as needed for nausea or vomiting. (Patient not taking: Reported on 12/24/2023)     promethazine-dextromethorphan (PROMETHAZINE-DM) 6.25-15 MG/5ML syrup Take 5 mLs by mouth 4 (four) times daily as needed. (Patient not taking: Reported on 10/06/2023) 118 mL 0   No current facility-administered medications for this visit.   Facility-Administered Medications Ordered in Other Visits  Medication Dose Route Frequency Provider Last Rate Last Admin   sodium chloride flush (NS) 0.9 % injection 10 mL  10 mL Intravenous Once Rickard Patience, MD         PHYSICAL EXAMINATION: ECOG PERFORMANCE STATUS: 1 - Symptomatic but completely ambulatory Vitals:   12/24/23 0900  BP: 122/76  Pulse: 99  Resp: 18  Temp: 98 F (36.7 C)  SpO2: 98%   Filed Weights   12/24/23 0900  Weight: 188 lb 8 oz (85.5 kg)    Physical Exam Constitutional:      General: She is not in acute distress.    Appearance: She is not diaphoretic.  HENT:     Head: Normocephalic and atraumatic.     Nose: Nose normal.     Mouth/Throat:     Pharynx: No oropharyngeal exudate.  Eyes:     General: No scleral icterus.    Pupils: Pupils are equal, round, and reactive to light.  Cardiovascular:     Rate and Rhythm: Normal rate and regular rhythm.     Heart sounds: No murmur heard. Pulmonary:     Effort: Pulmonary effort is normal. No respiratory distress.     Comments: Mild decreased breath sound bilaterally.. L>R Abdominal:     General: There is no distension.     Palpations: Abdomen is soft.     Tenderness: There is no abdominal tenderness.  Musculoskeletal:        General: Normal range of motion.     Cervical back: Normal range of motion and neck supple.  Skin:    General: Skin is warm and dry.     Findings: No erythema.  Neurological:     Mental Status: She is alert and oriented to person, place, and time.      Cranial Nerves: No cranial nerve deficit.     Motor: No abnormal muscle tone.     Coordination: Coordination normal.  Psychiatric:        Mood and Affect: Affect normal.       LABORATORY DATA:  I have reviewed the data as listed     Latest Ref Rng & Units 12/24/2023    8:51 AM 11/26/2023    8:50 AM 10/29/2023    9:33 AM  CBC  WBC 4.0 - 10.5 K/uL 4.0  3.1  4.0   Hemoglobin 12.0 - 15.0 g/dL 84.1  32.4  40.1   Hematocrit 36.0 - 46.0 % 32.3  30.4  31.0   Platelets 150 - 400 K/uL 172  158  188       Latest Ref Rng & Units 12/24/2023    8:51 AM 11/26/2023    8:50 AM 10/29/2023    9:33 AM  CMP  Glucose 70 - 99 mg/dL 027  253  664   BUN 8 - 23 mg/dL 27  18  23    Creatinine 0.44 - 1.00 mg/dL 4.03  4.74  2.59   Sodium 135 - 145 mmol/L 137  136  137   Potassium 3.5 - 5.1 mmol/L 4.0  3.8  3.7   Chloride 98 - 111 mmol/L 105  103  105   CO2 22 - 32 mmol/L 24  26  24    Calcium 8.9 - 10.3 mg/dL 9.2  8.9  9.1   Total Protein 6.5 - 8.1 g/dL 6.7  6.7  6.6   Total Bilirubin 0.0 - 1.2 mg/dL 0.8  1.1  1.1   Alkaline Phos 38 - 126 U/L 56  52  49   AST 15 - 41 U/L 29  32  28   ALT 0 - 44 U/L 21  25  21       RADIOGRAPHIC STUDIES: I have personally reviewed the radiological images as listed and agreed with the findings in the report. CT CHEST ABDOMEN PELVIS WO CONTRAST Result Date: 12/24/2023 CLINICAL DATA:  Breast cancer restaging. * Tracking Code: BO * EXAM: CT CHEST, ABDOMEN AND PELVIS WITHOUT CONTRAST TECHNIQUE: Multidetector CT imaging of the chest, abdomen and pelvis was performed following the standard protocol without IV contrast. RADIATION DOSE REDUCTION: This exam was performed according to the departmental dose-optimization program which includes automated exposure control, adjustment of the mA and/or kV according to patient size and/or use of iterative reconstruction technique. COMPARISON:  09/16/2023 CT chest, abdomen and pelvis. FINDINGS: CT CHEST FINDINGS Cardiovascular: Normal heart  size. Stable small pericardial effusion. Right internal jugular Port-A-Cath terminates near the cavoatrial junction. Three-vessel coronary atherosclerosis. Atherosclerotic nonaneurysmal thoracic aorta. Normal caliber pulmonary arteries. Mediastinum/Nodes: No significant thyroid nodules. Unremarkable esophagus. Surgical clip again noted in the left axilla. No axillary adenopathy. No pathologically enlarged mediastinal or discrete hilar nodes on these noncontrast images. Lungs/Pleura: No pneumothorax. Small layering right pleural effusion is stable. No significant left pleural effusion. No acute consolidative airspace disease or lung masses. Indistinct 0.6 cm medial right lower lobe nodule on series 4/image 100, new. No additional significant pulmonary nodules. Musculoskeletal: Patchy sclerotic osseous lesions in the manubrium, sternum, right clavicular head, proximal left humerus, inferior left glenoid, thoracic spine and bilateral ribs are all stable.  No new focal osseous lesions in the chest. 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 Mild thoracic spondylosis. CT ABDOMEN PELVIS FINDINGS Hepatobiliary: At least 4 scattered hypodense liver masses, all increased or new. Representative segment 4B left liver 2.7 x 1.9 cm mass on series 2/image 53, previously 2.1 x 1.9 cm. Representative segment 5 right liver 2.2 x 1.8 cm mass on series 2/image 54, previously 1.2 x 1.2 cm. Inferior right liver 1.1 x 0.7 cm mass in series 2/image 66, new. Normal gallbladder with no radiopaque cholelithiasis. No biliary ductal dilatation. Pancreas: Normal, with no mass or duct dilation. Spleen: Normal size. No mass. Adrenals/Urinary Tract: Left adrenal 1.6 cm nodule with density 36 HU, stable on numerous CTs back to at least 09/03/2021. No right adrenal nodules. No hydronephrosis. No renal stones. Chronic simple parapelvic bilateral renal cysts, for which no follow-up imaging is recommended. Normal bladder.  Stomach/Bowel: Normal non-distended stomach. Normal caliber small bowel with no small bowel wall thickening. Normal appendix. Moderate left colonic diverticulosis with no large bowel wall thickening or significant pericolonic fat stranding. Vascular/Lymphatic: Atherosclerotic nonaneurysmal abdominal aorta. Hazy central mesenteric fat stranding is not appreciably changed. No pathologically enlarged lymph nodes in the abdomen or pelvis. Reproductive: Grossly normal uterus.  No adnexal mass. Other: No pneumoperitoneum, ascites or focal fluid collection. Musculoskeletal: Widespread patchy sclerotic osseous lesions throughout the lumbar spine, entire sacrum, bilateral iliac wings, right ischium and bilateral proximal femora, not appreciably changed. No appreciable new focal osseous lesions. Moderate lumbar spondylosis. IMPRESSION: 1. 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 2. New indistinct 0.6 cm medial right lower lobe pulmonary nodule, indeterminate for inflammatory nodule versus pulmonary metastasis. Suggest attention on follow-up chest CT in 3 months. 3. Widespread patchy sclerotic osseous metastatic disease, not appreciably changed. 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 4. Stable small layering right pleural effusion. Stable small pericardial effusion. 5. Three-vessel coronary atherosclerosis. 6. Long-term stability of left adrenal nodule favoring an adenoma. 7. Moderate left colonic diverticulosis. 8.  Aortic Atherosclerosis (ICD10-I70.0). Electronically Signed   By: Delbert Phenix M.D.   On: 12/24/2023 10:49   DG Chest 2 View Result Date: 11/12/2023 CLINICAL DATA:  Intermittent shortness of breath with exertion. History of breast cancer. EXAM: CHEST - 2 VIEW COMPARISON:  Radiographs 05/14/2023 and 12/29/2022.  CT 09/16/2023. FINDINGS: Right IJ Port-A-Cath tip is unchanged at the level of the superior cavoatrial junction. The heart size and mediastinal contours are  stable with aortic atherosclerosis. Unchanged chronic basilar scarring bilaterally and a chronic small right pleural effusion. No new airspace disease, edema or pneumothorax. The bones appear unchanged with chronic thoracic compression deformities and scattered sclerotic metastases. IMPRESSION: Stable radiographic appearance of the chest. No evidence of acute cardiopulmonary process. Electronically Signed   By: Carey Bullocks M.D.   On: 11/12/2023 11:22   DG BONE DENSITY (DXA) Result Date: 10/12/2023 EXAM: DUAL X-RAY ABSORPTIOMETRY (DXA) FOR BONE MINERAL DENSITY IMPRESSION: Your patient Lennix Rotundo completed a BMD test on 10/12/2023 using the [employer] Advance DXA System (analysis version: 14.10) manufactured by [employer]. The following summarizes the results of our evaluation. Technologist: LCE PATIENT BIOGRAPHICAL: Name: Clorinda, Wyble Patient ID: [id number] Birth Date: 1951/04/07 Height: 64.0 in. Gender: Female Exam Date: 10/12/2023 Weight: 185.4 lbs. Indications: Caucasian, History of Metastatic Breast Cancer, POSTmenopausal Fractures: Treatments: Calcium, Femara, Vitamin D, Zometa ASSESSMENT: The BMD measured at Forearm Radius 33% is 0.749 g/cm2 with a T-score of -1.5. This patient is considered osteopenic according to World  Health Organization [facility]) criteria. L-3 and L-4 was excluded due to degenerative changes/compression fracture, etc. FRAX not applicable due to current use of Zometa. The scan quality is good. Site Region Measured Measured WHO Young Adult BMD Date       Age      Classification T-score AP Spine L1-L2 10[age]12/[age] Normal -0.8 1.067 g/cm2 DualFemur Neck Left 10/12/2023 73.9 Normal -0.6 0.951 g/cm2 Left Forearm Radius 33% 10/12/2023 [age] Osteopenia -1.5 0.749 g/cm2 World Health Organization [facility]) criteria for post-menopausal, Caucasian Women: Normal:       T-score at or above -1 SD Osteopenia:   T-score between -1 and -2.5 SD Osteoporosis: T-score at or below -2.5 SD  RECOMMENDATIONS: 1. All patients should optimize calcium and vitamin D intake. 2. Consider FDA-approved medical therapies in postmenopausal women and men aged 52 years and older, based on the following: a. A hip or vertebral (clinical or morphometric) fracture b. T-score < -2.5 at the femoral neck or spine after appropriate evaluation to exclude secondary causes c. Low bone mass (T-score between -1.0 and -2.5 at the femoral neck or spine) and a 10-year probability of a hip fracture > 3% or a 10-year probability of a major osteoporosis-related fracture > 20% based on the [place]-adapted WHO algorithm d. Clinician judgment and/or patient preferences may indicate treatment for people with 10-year fracture probabilities above or below these levels FOLLOW-UP: People with diagnosed cases of osteoporosis or at high risk for fracture should have regular bone mineral density tests. For patients eligible for Medicare, routine testing is allowed once every 2 years. The testing frequency can be increased to one year for patients who have rapidly progressing disease, those who are receiving or discontinuing medical therapy to restore bone mass, or have additional risk factors. I have reviewed this report, and agree with the above findings. [facility] Electronically Signed   By: Frederico Hamman M.D.   On: 10/12/2023 13:39

## 2023-12-24 NOTE — Patient Instructions (Signed)
 [facility] - A DEPT OF [facility]  Discharge Instructions: Thank you for choosing [facility] to provide your oncology and hematology care.  If you have a lab appointment with the Cancer Center, please go directly to the Cancer Center and check in at the registration area.  Wear comfortable clothing and clothing appropriate for easy access to any Portacath or PICC line.   We strive to give you quality time with your provider. You may need to reschedule your appointment if you arrive late (15 or more minutes).  Arriving late affects you and other patients whose appointments are after yours.  Also, if you miss three or more appointments without notifying the office, you may be dismissed from the clinic at the provider's discretion.      For prescription refill requests, have your pharmacy contact our office and allow 72 hours for refills to be completed.    Today you received the following chemotherapy and/or immunotherapy agents Zometa      To help prevent nausea and vomiting after your treatment, we encourage you to take your nausea medication as directed.  BELOW ARE SYMPTOMS THAT SHOULD BE REPORTED IMMEDIATELY: *FEVER GREATER THAN 100.4 F (38 C) OR HIGHER *CHILLS OR SWEATING *NAUSEA AND VOMITING THAT IS NOT CONTROLLED WITH YOUR NAUSEA MEDICATION *UNUSUAL SHORTNESS OF BREATH *UNUSUAL BRUISING OR BLEEDING *URINARY PROBLEMS (pain or burning when urinating, or frequent urination) *BOWEL PROBLEMS (unusual diarrhea, constipation, pain near the anus) TENDERNESS IN MOUTH AND THROAT WITH OR WITHOUT PRESENCE OF ULCERS (sore throat, sores in mouth, or a toothache) UNUSUAL RASH, SWELLING OR PAIN  UNUSUAL VAGINAL DISCHARGE OR ITCHING   Items with * indicate a potential emergency and should be followed up as soon as possible or go to the Emergency Department if any problems should occur.  Please show the CHEMOTHERAPY ALERT CARD or IMMUNOTHERAPY ALERT  CARD at check-in to the Emergency Department and triage nurse.  Should you have questions after your visit or need to cancel or reschedule your appointment, please contact [facility] - A DEPT OF [facility]  [phone] and follow the prompts.  Office hours are 8:00 a.m. to 4:30 p.m. Monday - Friday. Please note that voicemails left after 4:00 p.m. may not be returned until the following business day.  We are closed weekends and major holidays. You have access to a nurse at all times for urgent questions. Please call the main number to the clinic [phone] and follow the prompts.  For any non-urgent questions, you may also contact your provider using MyChart. We now offer e-Visits for anyone 45 and older to request care online for non-urgent symptoms. For details visit [email or web address].   Also download the MyChart app! Go to the app store, search "MyChart", open the app, select [facility], and log in with your MyChart username and password.

## 2023-12-25 LAB — CANCER ANTIGEN 27.29: CA 27.29: 68.9 U/mL — ABNORMAL HIGH (ref 0.0–38.6)

## 2023-12-25 LAB — CANCER ANTIGEN 15-3: CA 15-3: 57 U/mL — ABNORMAL HIGH (ref 0.0–25.0)

## 2023-12-27 ENCOUNTER — Telehealth: Payer: Self-pay

## 2023-12-27 ENCOUNTER — Ambulatory Visit: Payer: Medicare PPO

## 2023-12-27 ENCOUNTER — Encounter: Payer: Self-pay | Admitting: Oncology

## 2023-12-27 ENCOUNTER — Ambulatory Visit: Payer: Medicare PPO | Admitting: Oncology

## 2023-12-27 DIAGNOSIS — K769 Liver disease, unspecified: Secondary | ICD-10-CM

## 2023-12-27 NOTE — Telephone Encounter (Signed)
 Request for liver biopsy sent to IR. Date pending

## 2023-12-27 NOTE — Telephone Encounter (Signed)
-----   Message from Rickard Patience sent at 12/24/2023  9:44 PM EDT ----- CT scan showed some progression. Please let patient know that I recommend liver lesion biopsy. Please arrange MD follow up 1 week after her biopsy.  Thanks.

## 2023-12-28 ENCOUNTER — Other Ambulatory Visit: Payer: Self-pay | Admitting: Oncology

## 2023-12-28 ENCOUNTER — Other Ambulatory Visit: Payer: Self-pay

## 2023-12-28 ENCOUNTER — Other Ambulatory Visit (HOSPITAL_COMMUNITY): Payer: Self-pay

## 2023-12-28 DIAGNOSIS — C50919 Malignant neoplasm of unspecified site of unspecified female breast: Secondary | ICD-10-CM

## 2023-12-28 NOTE — Progress Notes (Signed)
 Specialty Pharmacy Refill Coordination Note  Amber Blevins is a 73 y.o. female contacted today regarding refills of specialty medication(s) Verzenio.  Patient requested (Patient-Rptd) Delivery   Delivery date: (Patient-Rptd) 01/06/24   Verified address: (Patient-Rptd) [address], [place], [place]. [address]   Medication will be filled on 01/05/24.   This fill date is pending response to refill request from provider. Patient is aware and if they have not received fill by intended date, they must follow up with pharmacy.

## 2023-12-29 NOTE — Telephone Encounter (Signed)
 Pt scheduled for biopsy on 4/10 @ 11a, arrive 10a. The plan is to hld Eliquis 48 hour prior to procedure, last dose should be on 4/7.   Pt informed of bx date and plan to hold eliquis.   Anticoagulation clearance pending, but will make sure it is obtained prior to 4/7.

## 2023-12-30 ENCOUNTER — Encounter: Payer: Self-pay | Admitting: Oncology

## 2023-12-30 NOTE — Telephone Encounter (Signed)
 Anticoagulation clearance received and scanned in media.

## 2023-12-31 ENCOUNTER — Other Ambulatory Visit: Payer: Self-pay

## 2023-12-31 ENCOUNTER — Other Ambulatory Visit (HOSPITAL_COMMUNITY): Payer: Self-pay

## 2023-12-31 MED ORDER — ABEMACICLIB 150 MG PO TABS
150.0000 mg | ORAL_TABLET | Freq: Two times a day (BID) | ORAL | 0 refills | Status: DC
  Filled 2023-12-31: qty 56, 28d supply, fill #0

## 2024-01-05 ENCOUNTER — Other Ambulatory Visit: Payer: Self-pay | Admitting: Radiology

## 2024-01-05 DIAGNOSIS — Z01812 Encounter for preprocedural laboratory examination: Secondary | ICD-10-CM

## 2024-01-05 NOTE — Progress Notes (Signed)
 Roanna Banning, MD sent to [facility] PROCEDURE / BIOPSY REVIEW Date: 12/28/23  Requested Biopsy site: Liver Reason for request: masses. Hx breast CA Imaging review: Best seen on CT AP  Decision: Approved Imaging modality to perform: Ultrasound Schedule with: Moderate Sedation Schedule for: Any VIR  Additional comments: @Schedulers . US liver mass Bx  Please contact me with questions, concerns, or if issue pertaining to this request arise.  Roanna Banning, MD [facility]

## 2024-01-05 NOTE — Progress Notes (Signed)
 Patient for US guided Liver Biopsy on Thurs 01/06/24, I called and spoke with the patient on the phone and gave pre-procedure instructions. Pt was made aware to be here at 10a, last dose of Eliquis on Mon 01/03/24, NPO after MN prior to procedure as well as driver post procedure/recovery/discharge. Pt stated understanding.  Called 01/04/24

## 2024-01-06 ENCOUNTER — Other Ambulatory Visit: Payer: Self-pay

## 2024-01-06 ENCOUNTER — Ambulatory Visit
Admission: RE | Admit: 2024-01-06 | Discharge: 2024-01-06 | Disposition: A | Discharge status: Home / Self Care | Source: Ambulatory Visit | Attending: Oncology | Admitting: Oncology

## 2024-01-06 DIAGNOSIS — I10 Essential (primary) hypertension: Secondary | ICD-10-CM | POA: Diagnosis not present

## 2024-01-06 DIAGNOSIS — R160 Hepatomegaly, not elsewhere classified: Secondary | ICD-10-CM | POA: Insufficient documentation

## 2024-01-06 DIAGNOSIS — K5730 Diverticulosis of large intestine without perforation or abscess without bleeding: Secondary | ICD-10-CM | POA: Diagnosis not present

## 2024-01-06 DIAGNOSIS — I4891 Unspecified atrial fibrillation: Secondary | ICD-10-CM | POA: Insufficient documentation

## 2024-01-06 DIAGNOSIS — Z01818 Encounter for other preprocedural examination: Secondary | ICD-10-CM | POA: Insufficient documentation

## 2024-01-06 DIAGNOSIS — C787 Secondary malignant neoplasm of liver and intrahepatic bile duct: Secondary | ICD-10-CM | POA: Diagnosis not present

## 2024-01-06 DIAGNOSIS — Z853 Personal history of malignant neoplasm of breast: Secondary | ICD-10-CM | POA: Insufficient documentation

## 2024-01-06 DIAGNOSIS — K769 Liver disease, unspecified: Secondary | ICD-10-CM

## 2024-01-06 DIAGNOSIS — Z7901 Long term (current) use of anticoagulants: Secondary | ICD-10-CM | POA: Insufficient documentation

## 2024-01-06 DIAGNOSIS — E785 Hyperlipidemia, unspecified: Secondary | ICD-10-CM | POA: Insufficient documentation

## 2024-01-06 DIAGNOSIS — J449 Chronic obstructive pulmonary disease, unspecified: Secondary | ICD-10-CM | POA: Diagnosis not present

## 2024-01-06 DIAGNOSIS — Z01812 Encounter for preprocedural laboratory examination: Secondary | ICD-10-CM

## 2024-01-06 LAB — CBC
HCT: 31.3 % — ABNORMAL LOW (ref 36.0–46.0)
Hemoglobin: 10.5 g/dL — ABNORMAL LOW (ref 12.0–15.0)
MCH: 33.7 pg (ref 26.0–34.0)
MCHC: 33.5 g/dL (ref 30.0–36.0)
MCV: 100.3 fL — ABNORMAL HIGH (ref 80.0–100.0)
Platelets: 176 10*3/uL (ref 150–400)
RBC: 3.12 MIL/uL — ABNORMAL LOW (ref 3.87–5.11)
RDW: 13.8 % (ref 11.5–15.5)
WBC: 4.7 10*3/uL (ref 4.0–10.5)
nRBC: 0 % (ref 0.0–0.2)

## 2024-01-06 LAB — PROTIME-INR
INR: 1.1 (ref 0.8–1.2)
Prothrombin Time: 14.1 s (ref 11.4–15.2)

## 2024-01-06 MED ORDER — MIDAZOLAM HCL 2 MG/2ML IJ SOLN
INTRAMUSCULAR | Status: AC
  Filled 2024-01-06: qty 2

## 2024-01-06 MED ORDER — FENTANYL CITRATE (PF) 100 MCG/2ML IJ SOLN
INTRAMUSCULAR | Status: AC
  Filled 2024-01-06: qty 2

## 2024-01-06 MED ORDER — SODIUM CHLORIDE 0.9 % IV SOLN: INTRAVENOUS | Status: DC

## 2024-01-06 MED ORDER — LIDOCAINE HCL (PF) 1 % IJ SOLN
10.0000 mL | Freq: Once | INTRAMUSCULAR | Status: AC
  Administered 2024-01-06: 10 mL via SUBCUTANEOUS
  Filled 2024-01-06: qty 10

## 2024-01-06 MED ORDER — HEPARIN SOD (PORK) LOCK FLUSH 100 UNIT/ML IV SOLN
INTRAVENOUS | Status: AC
  Filled 2024-01-06: qty 5

## 2024-01-06 MED ORDER — MIDAZOLAM HCL 2 MG/2ML IJ SOLN
INTRAMUSCULAR | Status: AC | PRN
  Administered 2024-01-06: 1 mg via INTRAVENOUS

## 2024-01-06 MED ORDER — FENTANYL CITRATE (PF) 100 MCG/2ML IJ SOLN
INTRAMUSCULAR | Status: AC | PRN
  Administered 2024-01-06: 50 ug via INTRAVENOUS

## 2024-01-06 NOTE — Procedures (Signed)
Interventional Radiology Procedure Note  Procedure: US guided biopsy of liver mass, right Complications: None EBL: None Recommendations: - Bedrest 2 hours.   - Routine wound care - Follow up pathology - Advance diet   Signed,  Gilmer Mor, DO

## 2024-01-06 NOTE — H&P (Signed)
 Chief Complaint: Liver lesion; request for liver lesion biopsy  Referring Provider(s): Yu,Zhou   Supervising Physician: Gilmer Mor  Patient Status: [facility] - Out-pt  History of Present Illness: Amber Blevins is a 73 y.o. female with history of afib (eliquis), COPD (RA baseline), HLD, HTN, breast CA with known liver, osseous, and pulmonary metastasis, receiving chemo and radiation. A CT in March 2025 showed liver lesion progression prompting request for image guided liver lesion biopsy.  She arrives NPO since MN, with driver/supervision (husband who is at bedside throughout conversation), having held eliquis since Monday night.   She has had sedation with numerous procedures without issues apart from"waking up early." She no longer uses [place] [facility] with her COPD. No CPAP at night.   Patient is Full Code  Past Medical History:  Diagnosis Date   A-fib (HCC)    Breast cancer (HCC)    COPD (chronic obstructive pulmonary disease) (HCC)    Family history of adverse reaction to anesthesia    brother has problem with coming out of anesthesia   High cholesterol    Hypertension    Personal history of radiation therapy    Pre-diabetes     Past Surgical History:  Procedure Laterality Date   BREAST LUMPECTOMY Left    2010   COLONOSCOPY     EYE SURGERY Left    Cataract surgery   PORTACATH PLACEMENT N/A 08/01/2021   Procedure: INSERTION PORT-A-CATH;  Surgeon: Carolan Shiver, MD;  Location: [facility] ORS;  Service: General;  Laterality: N/A;   thorocentesis Right 07/10/2021   and another on 07/25/21    Allergies: Green tea (camellia sinensis), Melaleuca viridiflora, Tea tree oil, and Lisinopril  Medications: Prior to Admission medications   Medication Sig Start Date End Date Taking? Authorizing Provider  abemaciclib (VERZENIO) 150 MG tablet Take 1 tablet (150 mg total) by mouth 2 (two) times daily. 12/31/23  Yes Rickard Patience, MD  atorvastatin (LIPITOR) 40 MG tablet Take 40 mg by  mouth at bedtime. 11/25/20  Yes [provider]  Boswellia-Glucosamine-Vit D (OSTEO BI-FLEX-GLUCOS/5-LOXIN) TABS Take 1 capsule by mouth in the morning and at bedtime.   Yes [provider]  Calcium Carb-Cholecalciferol 600-400 MG-UNIT TABS Take 1 tablet by mouth daily.   Yes [provider]  cyanocobalamin 1000 MCG tablet Take 1,000 mcg by mouth daily.   Yes [provider]  Docusate Sodium (DSS) 100 MG CAPS Take 1 capsule by mouth daily.   Yes [provider]  gabapentin (NEURONTIN) 300 MG capsule TAKE 2 CAPSULES BY MOUTH IN THE MORNING AND 3 CAPSULES IN THE EVENING 12/17/23  Yes Rickard Patience, MD  Iron-Vitamin C (VITRON-C) 65-125 MG TABS Take by mouth.   Yes [provider]  letrozole (FEMARA) 2.5 MG tablet Take 1 tablet (2.5 mg total) by mouth daily. 10/29/23  Yes Rickard Patience, MD  Loperamide HCl (IMODIUM PO) Take by mouth.   Yes [provider]  LORazepam (ATIVAN) 0.5 MG tablet TAKE 1 TABLET BY MOUTH AT BEDTIME AS NEEDED FOR ANXIETY 12/01/21  Yes Rickard Patience, MD  losartan (COZAAR) 50 MG tablet Take 50 mg by mouth daily. 03/03/22  Yes [provider]  magnesium chloride (SLOW-MAG) 64 MG TBEC SR tablet Take 1 tablet by mouth at bedtime.   Yes [provider]  Turmeric (QC TUMERIC COMPLEX PO) Take 1 capsule by mouth at bedtime.   Yes [provider]  acetaminophen (TYLENOL) 650 MG CR tablet Take 650 mg by mouth every 8 (  eight) hours as needed for pain.    [provider]  diphenoxylate-atropine (LOMOTIL) 2.5-0.025 MG tablet Take 1 tablet by mouth 4 (four) times daily as needed for diarrhea or loose stools. 10/05/21   Finnegan, Tollie Pizza, MD  ELIQUIS 5 MG TABS tablet Take 5 mg by mouth 2 (two) times daily. 06/02/22   [provider]  lidocaine-prilocaine (EMLA) cream Apply small amount to port and cover with saran wrap 1-2 hours prior to port access 04/27/23   Rickard Patience, MD  melatonin (MELATONIN MAXIMUM STRENGTH) 5  MG TABS Take 1 tablet by mouth at bedtime as needed.    [provider]  ondansetron (ZOFRAN) 8 MG tablet Take by mouth every 8 (eight) hours as needed for nausea or vomiting. Patient not taking: Reported on 12/24/2023    [provider]  prochlorperazine (COMPAZINE) 5 MG tablet Take 5 mg by mouth every 6 (six) hours as needed for nausea or vomiting. Patient not taking: Reported on 12/24/2023    [provider]  promethazine-dextromethorphan (PROMETHAZINE-DM) 6.25-15 MG/5ML syrup Take 5 mLs by mouth 4 (four) times daily as needed. Patient not taking: Reported on 10/06/2023 05/14/23   Gareth Morgan     Family History  Problem Relation Age of Onset   Cancer Mother        gynecological   Lung cancer Mother    Diabetes Father    Heart disease Father    Parkinson's disease Father    Brain cancer Brother    Bladder Cancer Brother    Pulmonary disease Brother    Rheumatic fever Brother    Breast cancer Neg Hx     Social History   Socioeconomic History   Marital status: Married    Spouse name: Not on file   Number of children: Not on file   Years of education: Not on file   Highest education level: Not on file  Occupational History   Not on file  Tobacco Use   Smoking status: Former    Current packs/day: 0.00    Average packs/day: 1 pack/day for 25.0 years (25.0 ttl pk-yrs)    Types: Cigarettes    Start date: 28    Quit date: 82    Years since quitting: 35.2   Smokeless tobacco: Never  Vaping Use   Vaping status: Never Used  Substance and Sexual Activity   Alcohol use: Not Currently   Drug use: Never   Sexual activity: Not on file  Other Topics Concern   Not on file  Social History Narrative   Not on file   Social Drivers of Health   Financial Resource Strain: Low Risk  (07/26/2023)   Received [facility]   Overall Financial Resource Strain (CARDIA)    Difficulty of Paying Living Expenses: Not hard at all   Food Insecurity: No Food Insecurity (07/26/2023)   Received from [facility]   Hunger Vital Sign    Worried About Running Out of Food in the Last Year: Never true    Ran Out of Food in the Last Year: Never true  Transportation Needs: No Transportation Needs (07/26/2023)   Received from [facility] - Transportation    In the past 12 months, has lack of transportation kept you from medical appointments or from getting medications?: No    Lack of Transportation (Non-Medical): No  Physical Activity: Not on file  Stress: Not on file  Social Connections: Not  on file     Review of Systems: A 12 point ROS discussed and pertinent positives are indicated in the HPI above.  All other systems are negative.  Review of Systems  Constitutional:  Negative for chills and fever.  HENT:  Negative for sore throat.   Respiratory:  Positive for shortness of breath.        Shob baseline level only with exertion  Cardiovascular:  Negative for chest pain.  Gastrointestinal:  Negative for abdominal pain, blood in stool, nausea and vomiting.  Genitourinary:  Negative for dysuria and hematuria.  Hematological:  Does not bruise/bleed easily.    Vital Signs: BP (!) 154/94   Pulse 93   Temp 97.9 F (36.6 C) (Oral)   Resp 18   Ht 5\' 4"  (1.626 m)   Wt 185 lb 13.6 oz (84.3 kg)   SpO2 98%   BMI 31.90 kg/m   Advance Care Plan: No documents on file   Physical Exam HENT:     Mouth/Throat:     Mouth: Mucous membranes are dry.     Pharynx: Oropharynx is clear.  Cardiovascular:     Rate and Rhythm: Normal rate.     Pulses: Normal pulses.     Heart sounds: Normal heart sounds.     Comments: Port present and accessed R chest Pulmonary:     Effort: Pulmonary effort is normal.     Breath sounds: Normal breath sounds.  Abdominal:     General: There is no distension.     Palpations: Abdomen is soft. There is no mass.     Tenderness: There is no abdominal  tenderness.  Musculoskeletal:     Right lower leg: No edema.     Left lower leg: No edema.  Skin:    General: Skin is warm and dry.     Coloration: Skin is not jaundiced.     Comments: Minimal moisture associated rash under bilateral breasts, does not involve planned procedure area  Neurological:     Mental Status: She is alert and oriented to person, place, and time.  Psychiatric:        Mood and Affect: Mood normal.        Behavior: Behavior normal.     Imaging: CT CHEST ABDOMEN PELVIS WO CONTRAST Result Date: 12/24/2023 CLINICAL DATA:  Breast cancer restaging. * Tracking Code: BO * EXAM: CT CHEST, ABDOMEN AND PELVIS WITHOUT CONTRAST TECHNIQUE: Multidetector CT imaging of the chest, abdomen and pelvis was performed following the standard protocol without IV contrast. RADIATION DOSE REDUCTION: This exam was performed according to the departmental dose-optimization program which includes automated exposure control, adjustment of the mA and/or kV according to patient size and/or use of iterative reconstruction technique. COMPARISON:  09/16/2023 CT chest, abdomen and pelvis. FINDINGS: CT CHEST FINDINGS Cardiovascular: Normal heart size. Stable small pericardial effusion. Right internal jugular Port-A-Cath terminates near the cavoatrial junction. Three-vessel coronary atherosclerosis. Atherosclerotic nonaneurysmal thoracic aorta. Normal caliber pulmonary arteries. Mediastinum/Nodes: No significant thyroid nodules. Unremarkable esophagus. Surgical clip again noted in the left axilla. No axillary adenopathy. No pathologically enlarged mediastinal or discrete hilar nodes on these noncontrast images. Lungs/Pleura: No pneumothorax. Small layering right pleural effusion is stable. No significant left pleural effusion. No acute consolidative airspace disease or lung masses. Indistinct 0.6 cm medial right lower lobe nodule on series 4/image 100, new. No additional significant pulmonary nodules.  Musculoskeletal: Patchy sclerotic osseous lesions in the manubrium, sternum, right clavicular head, proximal left humerus, inferior left glenoid, thoracic spine and  bilateral ribs are all stable. No new focal osseous lesions in the chest. 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 Mild thoracic spondylosis. CT ABDOMEN PELVIS FINDINGS Hepatobiliary: At least 4 scattered hypodense liver masses, all increased or new. Representative segment 4B left liver 2.7 x 1.9 cm mass on series 2/image 53, previously 2.1 x 1.9 cm. Representative segment 5 right liver 2.2 x 1.8 cm mass on series 2/image 54, previously 1.2 x 1.2 cm. Inferior right liver 1.1 x 0.7 cm mass in series 2/image 66, new. Normal gallbladder with no radiopaque cholelithiasis. No biliary ductal dilatation. Pancreas: Normal, with no mass or duct dilation. Spleen: Normal size. No mass. Adrenals/Urinary Tract: Left adrenal 1.6 cm nodule with density 36 HU, stable on numerous CTs back to at least 09/03/2021. No right adrenal nodules. No hydronephrosis. No renal stones. Chronic simple parapelvic bilateral renal cysts, for which no follow-up imaging is recommended. Normal bladder. Stomach/Bowel: Normal non-distended stomach. Normal caliber small bowel with no small bowel wall thickening. Normal appendix. Moderate left colonic diverticulosis with no large bowel wall thickening or significant pericolonic fat stranding. Vascular/Lymphatic: Atherosclerotic nonaneurysmal abdominal aorta. Hazy central mesenteric fat stranding is not appreciably changed. No pathologically enlarged lymph nodes in the abdomen or pelvis. Reproductive: Grossly normal uterus.  No adnexal mass. Other: No pneumoperitoneum, ascites or focal fluid collection. Musculoskeletal: Widespread patchy sclerotic osseous lesions throughout the lumbar spine, entire sacrum, bilateral iliac wings, right ischium and bilateral proximal femora, not appreciably changed. No appreciable  new focal osseous lesions. Moderate lumbar spondylosis. IMPRESSION: 1. 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 2. New indistinct 0.6 cm medial right lower lobe pulmonary nodule, indeterminate for inflammatory nodule versus pulmonary metastasis. Suggest attention on follow-up chest CT in 3 months. 3. Widespread patchy sclerotic osseous metastatic disease, not appreciably changed. 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 4. Stable small layering right pleural effusion. Stable small pericardial effusion. 5. Three-vessel coronary atherosclerosis. 6. Long-term stability of left adrenal nodule favoring an adenoma. 7. Moderate left colonic diverticulosis. 8.  Aortic Atherosclerosis (ICD10-I70.0). Electronically Signed   By: Delbert Phenix M.D.   On: 12/24/2023 10:49    Labs:  CBC: Recent Labs    10/01/23 0953 10/29/23 0933 11/26/23 0850 12/24/23 0851  WBC 3.4* 4.0 3.1* 4.0  HGB 10.4* 10.4* 10.1* 10.6*  HCT 30.7* 31.0* 30.4* 32.3*  PLT 167 188 158 172    COAGS: No results for input(s): "INR", "APTT" in the last 8760 hours.  BMP: Recent Labs    10/01/23 0953 10/29/23 0933 11/26/23 0850 12/24/23 0851  NA 137 137 136 137  K 3.6 3.7 3.8 4.0  CL 107 105 103 105  CO2 23 24 26 24   GLUCOSE 118* 126* 127* 115*  BUN 24* 23 18 27*  CALCIUM 8.7* 9.1 8.9 9.2  CREATININE 1.05* 1.07* 1.15* 1.06*  GFRNONAA 56* 55* 50* 55*    LIVER FUNCTION TESTS: Recent Labs    10/01/23 0953 10/29/23 0933 11/26/23 0850 12/24/23 0851  BILITOT 0.8 1.1 1.1 0.8  AST 36 28 32 29  ALT 26 21 25 21   ALKPHOS 58 49 52 56  PROT 6.2* 6.6 6.7 6.7  ALBUMIN 3.7 3.8 3.7 3.7    TUMOR MARKERS: No results for input(s): "AFPTM", "CEA", "CA199", "CHROMGRNA" in the last 8760 hours.  Assessment and Plan:  Request for image guided liver lesion biopsy - labs ordered, pending - imaging available from 11/2023 CT; interval progression of liver metastasis, new lower lobe pulm  nodule - VSS - no new infection or bleeding concern indicated in ROS or phys exam - no contraindication for procedure identified  Risks and benefits of image guided liver lesion biopsy was discussed with the patient and/or patient's family including, but not limited to bleeding possibly requiring additional procedures, infection, damage to adjacent structures or low yield requiring additional tests.  All of the questions were answered and there is agreement to proceed.  Consent signed and in chart.   Thank you for allowing our service to participate in Amber Blevins 's care.    Electronically Signed: Carlton Adam, NP   01/06/2024, 10:54 AM     I spent a total of  30 Minutes   in face to face in clinical consultation, greater than 50% of which was counseling/coordinating care for liver lesion; request for image guided liver lesion biopsy   (A copy of this note was sent to the referring provider and the time of visit.)

## 2024-01-11 LAB — SURGICAL PATHOLOGY

## 2024-01-13 ENCOUNTER — Inpatient Hospital Stay: Attending: Oncology | Admitting: Oncology

## 2024-01-13 ENCOUNTER — Telehealth: Payer: Self-pay

## 2024-01-13 ENCOUNTER — Encounter: Payer: Self-pay | Admitting: Oncology

## 2024-01-13 VITALS — BP 117/52 | HR 93 | Temp 97.6°F | Resp 18 | Wt 187.6 lb

## 2024-01-13 DIAGNOSIS — K521 Toxic gastroenteritis and colitis: Secondary | ICD-10-CM

## 2024-01-13 DIAGNOSIS — C787 Secondary malignant neoplasm of liver and intrahepatic bile duct: Secondary | ICD-10-CM | POA: Diagnosis present

## 2024-01-13 DIAGNOSIS — C7972 Secondary malignant neoplasm of left adrenal gland: Secondary | ICD-10-CM | POA: Insufficient documentation

## 2024-01-13 DIAGNOSIS — C50919 Malignant neoplasm of unspecified site of unspecified female breast: Secondary | ICD-10-CM | POA: Diagnosis not present

## 2024-01-13 DIAGNOSIS — Z923 Personal history of irradiation: Secondary | ICD-10-CM | POA: Insufficient documentation

## 2024-01-13 DIAGNOSIS — Z79899 Other long term (current) drug therapy: Secondary | ICD-10-CM

## 2024-01-13 DIAGNOSIS — Z853 Personal history of malignant neoplasm of breast: Secondary | ICD-10-CM | POA: Diagnosis not present

## 2024-01-13 DIAGNOSIS — C7951 Secondary malignant neoplasm of bone: Secondary | ICD-10-CM | POA: Diagnosis not present

## 2024-01-13 DIAGNOSIS — G620 Drug-induced polyneuropathy: Secondary | ICD-10-CM

## 2024-01-13 DIAGNOSIS — T451X5A Adverse effect of antineoplastic and immunosuppressive drugs, initial encounter: Secondary | ICD-10-CM

## 2024-01-13 DIAGNOSIS — Z87891 Personal history of nicotine dependence: Secondary | ICD-10-CM | POA: Insufficient documentation

## 2024-01-13 DIAGNOSIS — C7971 Secondary malignant neoplasm of right adrenal gland: Secondary | ICD-10-CM | POA: Insufficient documentation

## 2024-01-13 DIAGNOSIS — Z5181 Encounter for therapeutic drug level monitoring: Secondary | ICD-10-CM | POA: Diagnosis not present

## 2024-01-13 DIAGNOSIS — Z9221 Personal history of antineoplastic chemotherapy: Secondary | ICD-10-CM | POA: Diagnosis not present

## 2024-01-13 NOTE — Telephone Encounter (Signed)
 [facility] NGS testing (xR+xT) requested on liver specimen [id number] collected on 01/06/24.   Financial assistance application completed and approved. Patient's out-of-pocket cost will be $0

## 2024-01-13 NOTE — Assessment & Plan Note (Signed)
 Stop colace. Imodium PRN

## 2024-01-13 NOTE — Assessment & Plan Note (Signed)
#  Grade 2 neuropathy, continue gabapentin,  Continue gabapentin 600 mg in a.m., she lower the night time dose to 900 mg  Declined acupuncture

## 2024-01-13 NOTE — Progress Notes (Signed)
 DISCONTINUE ON PATHWAY REGIMEN - Breast     A cycle is every 28 days (3 weeks on and 1 week off):     Paclitaxel   **Always confirm dose/schedule in your pharmacy ordering system**  PRIOR TREATMENT: [id number]: Paclitaxel 80 mg/m2 D1, 8, 15 q28 Days  START ON PATHWAY REGIMEN - Breast     A cycle is every 21 days:     Fam-trastuzumab deruxtecan-nxki   **Always confirm dose/schedule in your pharmacy ordering system**  Patient Characteristics: Distant Metastases or Locoregional Recurrent Disease - Unresected, M0 or Locally Advanced Unresectable Disease Progressing after Neoadjuvant and Local Therapies, M0, HER2 Low/Negative, ER Positive, Chemotherapy, HER2 Low, Second Line, [employer] or  Not a Candidate for Molecular Targeted Therapy Therapeutic Status: Distant Metastases HER2 Status: Low ER Status: Positive (+) PR Status: Negative (-) Therapy Approach Indicated: Standard Chemotherapy/Endocrine Therapy Line of Therapy: Second Line Intent of Therapy: Non-Curative / Palliative Intent, Discussed with Patient

## 2024-01-13 NOTE — Assessment & Plan Note (Signed)
 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 Continue calcium and vitamin D supplementation. Marland Kitchen  MRI brain showed enlarging right frontal calvarium - s/p RT  Zometa Q2 months

## 2024-01-13 NOTE — Assessment & Plan Note (Addendum)
 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 Liver biopsy confirmed metastatic breast carcinoma, ER 95%,PR 0% HER2 low (1+) I will obtain tissue NGS testing.  I recommend to switch to next line treatment with Enhertu.  She may continue Abemaciclib 150mg  BID until next week to bridge to new treatments  Rationale and side effects were reviewed with patient.  She agrees with the plan. Check insurance approval and anticipate to start in 2-3 weeks.  Obtain MUGA for evaluation of baseline LVEF

## 2024-01-13 NOTE — Progress Notes (Signed)
 Hematology/Oncology Progress note [phone] [phone]     CHIEF COMPLAINTS/REASON FOR VISIT:  metastatic breast caner ASSESSMENT & PLAN:   Cancer Staging  Primary malignant neoplasm of breast with metastasis (HCC) Staging form: Breast, AJCC 8th Edition - Clinical stage from 07/12/2021: Stage IV (rcTX, cNX, pM1, ER+, PR+, HER2-) - Signed by Rickard Patience, MD on 03/08/2022   Primary malignant neoplasm of breast with metastasis (HCC) Metastatic Breast cancer with extensive thoracic and bone involvement, in visceral crisis, ER+, 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 Liver biopsy confirmed metastatic breast carcinoma, ER 95%,PR 0% HER2 low (1+) I will obtain tissue NGS testing.  I recommend to switch to next line treatment with Enhertu.  She may continue Abemaciclib 150mg  BID until next week to bridge to new treatments  Rationale and side effects were reviewed with patient.  She agrees with the plan. Check insurance approval and anticipate to start in 2-3 weeks.  Obtain MUGA for evaluation of baseline LVEF   Chemotherapy induced diarrhea Stop colace. 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 Continue calcium and vitamin D supplementation. Marland Kitchen  MRI brain showed enlarging right frontal calvarium - s/p RT  Zometa Q2 months      Orders Placed This Encounter  Procedures   NM Cardiac Muga Rest    Standing Status:   Future    Expected Date:   01/20/2024    Expiration Date:    01/12/2025    Preferred imaging location?:    [facility]   MR Brain W Wo Contrast    Standing Status:   Future    Expected Date:   01/20/2024    Expiration Date:   01/12/2025    If indicated for the ordered procedure, I authorize the administration of contrast media per Radiology protocol:   Yes    What is the patient's sedation requirement?:   No Sedation    Does the patient have a pacemaker or implanted devices?:   No    Use SRS Protocol?:   No    Preferred imaging location?:   [facility] (table limit - 550lbs)   CBC with Differential (Cancer Center Only)    Standing Status:   Future    Expected Date:   02/02/2024    Expiration Date:   02/01/2025   CMP (Cancer Center only)    Standing Status:   Future    Expected Date:   02/02/2024    Expiration Date:   02/01/2025    Follow up 2-3 weeks lab MD Enhertu All questions were answered. The patient knows to call the clinic with any problems, questions or concerns.  Rickard Patience, MD, PhD [facility] Hematology Oncology 01/13/2024      HISTORY OF PRESENTING ILLNESS:   Amber Blevins is a  73 y.o.  female with PMH listed below presents for follow up for treatment of metastatic breast cancer.   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 Additional metastatic involvement T12, L1 through L3.  No primary tumor site identified. Top  differential considerations are Metastatic Disease Unknown primary, less likely Lymphoma or Multiple Myeloma.   2. Superimposed lumbar spine degeneration, including degenerative moderate to severe left L3 and L4 nerve level impingement from disc herniation.      07/02/2021 Imaging   CT chest abdomen pelvis showed innumerable small pulmonary and pleural nodules consistent with diffuse metastasis.  Associated with probable malignant pleural effusion.  Mediastinal and hilar lymphadenopathy consistent with metastatic disease.  Hepatic and bilateral adrenal gland metastasis.  Diffuse extensive destructive metastatic bone disease involving pelvis.   07/12/2021 Cancer Staging   Staging form: Breast, AJCC 8th Edition - Clinical stage from 07/12/2021: Stage IV (rcTX, cNX, pM1, ER+, PR+, HER2-) - Signed by Timmy Forbes, MD on 03/08/2022 Stage prefix: Recurrence   07/17/2021 Initial Diagnosis   Metastatic breast cancer -history of breast cancer, diagnosed in 2010 Left-sided T2 (2.4cm) N0, ER/PR positive, HER2 negative IDC of the breast, s/p lumpectomy by Dr.Meyer at [facility]. Oncotype score of 11 who completed radiation and she finished 10 years of Femara from 05/2009 and stopped in 2020.  -07/12/2021 patient underwent right thoracentesis.  Cytology was positive for metastatic carcinoma, compatible with breast origin.  ER/PR +, HER2 negative -07/16/2021, patient underwent iliac bone biopsy.  Positive for metastatic carcinoma.   07/21/2021 - 09/16/2021 Chemotherapy   Patient is on Treatment Plan : BREAST Paclitaxel D1,8,15 q28d     09/04/2021 Imaging   CT chest abdomen pelvis showed improvement of the pulmonary nodularity and thickening in the left and the right lung.  Persistent nodular interstitial and pleural thickening remains.  Improvement of mediastinal and hilar adenopathy, hepatic metastatic lesions are similar.  Adrenal gland metastasis is similar.  Interval increase in the sclerotic bone metastasis.     09/30/2021 -  Chemotherapy   started on abemaciclib 100 mg twice daily and letrozole.   02/24/2022 Imaging    CT chest abdomen pelvis with contrast showed unchanged bilateral pleural effusion with associated intralobular septal thickening of the lung bases.  Without discrete nodularity consistent with stable appearance of treated pleural and lymphangitic metastatic disease.  Stable or minimally diminished subcentimeter liver lesions.  Unchanged left adrenal metastasis.  Unchanged widespread sclerotic osseous metastatic disease involving the included axial and appendicular skeleton.  No evidence of new metastatic disease in the chest abdomen or pelvis.  Coronary artery disease.   03/06/2022 Procedure   Ultrasound guided thoracentesis, removal of pleural fluid.    05/29/2022 Imaging   CT chest abdomen pelvis 1. Stable CT of the chest, abdomen and pelvis. 2. Unchanged appearance of bilateral pleural effusions, pleural nodularity, and interlobular septal thickening compatible with pleural and lymphangitic metastatic disease. 3. Stable appearance of multifocal sclerotic bone metastases. 4. Multifocal low-attenuation liver lesions are unchanged in the interval. 5. Stable appearance of left adrenal gland metastases. 6. Stable appearance of small pericardial effusion.7. No new or progressive disease identified.8. Aortic Atherosclerosis (ICD10-I70.0).   06/06/2022 Imaging   Bone scan Constellation of findings  are consistent with multifocal osseous metastatic disease throughout the axial and appendicular skeleton.Of note, osseous metastatic disease involves the RIGHT greater than LEFT femur. Consider evaluation with dedicated hip and femur radiographs to evaluate for overall disease burden and potential risk for pathologic fracture.   08/31/2022 Imaging   CT chest abdomen pelvis w contrast 1. Unchanged small right, trace left pleural effusions with associated diffuse interlobular septal thickening and fine  fissural and perilymphatic nodularity throughout the lungs, findings consistent with lymphangitic metastatic disease. 2. Stable or slightly diminished very subtle hypodense lesion of the central right lobe of the liver, hepatic segment VIII measuring 0.6 cm, previously 0.8 cm. Additional unchanged, very subtle hypodense lesion of hepatic segment IV B measuring 1.1 cm. 3. Unchanged widespread sclerotic osseous metastatic disease throughout the included axial and proximal appendicular skeleton, particularly dense in the lower lumbar spine and sacrum. 4. Unchanged left adrenal nodule measuring 1.7 x 1.1 cm. 5. Overall constellation of findings is consistent with stable metastatic disease. 6. Coronary artery disease.    12/01/2022 Miscellaneous   [facility] liquid biopsy - PIK3CA mutation. 0.1% Not enough tissue for tissue NGS   03/08/2023 Imaging   Bone scan showed Multifocal osseous metastatic disease. One new focus along the right side of the skull   08/23/2023 - 09/06/2023 Radiation Therapy   Skull radiation.    12/24/2023 Imaging   CT chest abdomen pelvis wo contrast showed 1. 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 2. New indistinct 0.6 cm medial right lower lobe pulmonary nodule, indeterminate for inflammatory nodule versus pulmonary metastasis. Suggest attention on follow-up chest CT in 3 months. 3. Widespread patchy sclerotic osseous metastatic disease, not appreciably changed. 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 4. Stable small layering right pleural effusion. Stable small pericardial effusion. 5. Three-vessel coronary atherosclerosis. 6. Long-term stability of left adrenal nodule favoring an adenoma. 7. Moderate left colonic diverticulosis. 8.  Aortic Atherosclerosis (ICD10-I70.0).    02/02/2024 -  Chemotherapy   Patient is on Treatment Plan : BREAST Fam-Trastuzumab Deruxtecan-nxki (Enhertu) (5.4) q21d     Metastasis to bone  (HCC)  02/04/2022 Initial Diagnosis   Metastasis to bone (HCC)   02/02/2024 -  Chemotherapy   Patient is on Treatment Plan : BREAST Fam-Trastuzumab Deruxtecan-nxki (Enhertu) (5.4) q21d      Patient follows up with cardiology for evaluation of bradycardia.  Cardiology notes reviewed.  72-hour Holter monitor revealed revealed predominant sinus rhythm with mean heart rate of 96 bpm, heart rate range 53 to 129 bpm, frequent premature ventricular contractions (27% burden), and intermittent atrial fibrillation (5% burden) with longest episode 3 hours. CHA2DS2-VASc score of 3, per cardiology recommendation patient was started on Eliquis 5 mg twice daily.     INTERVAL HISTORY Amber Blevins is a 73 y.o. female who has above history reviewed by me today presents for follow up visit for metastatic breast cancer Occasional back and hip pain..  Denies any headache, nausea vomiting diarrhea.  Fever or chills. She tolerates Abemaciclib 150mg  BID, manageable diarrhea No fever or chills.   .  Review of Systems  Constitutional:  Negative for appetite change, chills, fatigue and fever.  HENT:   Negative for hearing loss and voice change.        Nasal congestion  Eyes:  Negative for eye problems.  Respiratory:  Positive for cough. Negative for chest tightness and shortness of breath.   Cardiovascular:  Negative for chest pain.  Gastrointestinal:  Negative for abdominal distention, abdominal pain  and blood in stool.  Endocrine: Negative for hot flashes.  Genitourinary:  Negative for difficulty urinating and frequency.   Musculoskeletal:  Negative for arthralgias.  Skin:  Negative for itching and rash.  Neurological:  Positive for numbness. Negative for extremity weakness.  Hematological:  Negative for adenopathy.  Psychiatric/Behavioral:  Negative for confusion.      MEDICAL HISTORY:  Past Medical History:  Diagnosis Date   A-fib (HCC)    Breast cancer (HCC)    COPD (chronic obstructive  pulmonary disease) (HCC)    Family history of adverse reaction to anesthesia    brother has problem with coming out of anesthesia   High cholesterol    Hypertension    Personal history of radiation therapy    Pre-diabetes     SURGICAL HISTORY: Past Surgical History:  Procedure Laterality Date   BREAST LUMPECTOMY Left    2010   COLONOSCOPY     EYE SURGERY Left    Cataract surgery   PORTACATH PLACEMENT N/A 08/01/2021   Procedure: INSERTION PORT-A-CATH;  Surgeon: Carolan Shiver, MD;  Location: [facility] ORS;  Service: General;  Laterality: N/A;   thorocentesis Right 07/10/2021   and another on 07/25/21    SOCIAL HISTORY: Social History   Socioeconomic History   Marital status: Married    Spouse name: Not on file   Number of children: Not on file   Years of education: Not on file   Highest education level: Not on file  Occupational History   Not on file  Tobacco Use   Smoking status: Former    Current packs/day: 0.00    Average packs/day: 1 pack/day for 25.0 years (25.0 ttl pk-yrs)    Types: Cigarettes    Start date: 59    Quit date: 38    Years since quitting: 35.3   Smokeless tobacco: Never  Vaping Use   Vaping status: Never Used  Substance and Sexual Activity   Alcohol use: Not Currently   Drug use: Never   Sexual activity: Not on file  Other Topics Concern   Not on file  Social History Narrative   Not on file   Social Drivers of Health   Financial Resource Strain: Low Risk  (07/26/2023)   Received from [facility]   Overall Financial Resource Strain (CARDIA)    Difficulty of Paying Living Expenses: Not hard at all  Food Insecurity: No Food Insecurity (07/26/2023)   Received from [facility]   Hunger Vital Sign    Worried About Running Out of Food in the Last Year: Never true    Ran Out of Food in the Last Year: Never true  Transportation Needs: No Transportation Needs (07/26/2023)   Received from [facility] - Transportation    In the past 12 months, has lack of transportation kept you from medical appointments or from getting medications?: No    Lack of Transportation (Non-Medical): No  Physical Activity: Not on file  Stress: Not on file  Social Connections: Not on file  Intimate Partner Violence: Not on file    FAMILY HISTORY: Family History  Problem Relation Age of Onset   Cancer Mother        gynecological   Lung cancer Mother    Diabetes Father    Heart disease Father    Parkinson's disease Father    Brain cancer Brother    Bladder Cancer Brother    Pulmonary disease Brother  Rheumatic fever Brother    Breast cancer Neg Hx     ALLERGIES:  is allergic to green tea (camellia sinensis), melaleuca viridiflora, tea tree oil, and lisinopril.  MEDICATIONS:  Current Outpatient Medications  Medication Sig Dispense Refill   abemaciclib (VERZENIO) 150 MG tablet Take 1 tablet (150 mg total) by mouth 2 (two) times daily. 56 tablet 0   acetaminophen (TYLENOL) 650 MG CR tablet Take 650 mg by mouth every 8 (eight) hours as needed for pain.     atorvastatin (LIPITOR) 40 MG tablet Take 40 mg by mouth at bedtime.     azelastine (ASTELIN) 0.1 % nasal spray Place 1 spray into both nostrils daily as needed for rhinitis. Use in each nostril as directed     Boswellia-Glucosamine-Vit D (OSTEO BI-FLEX-GLUCOS/5-LOXIN) TABS Take 1 capsule by mouth in the morning and at bedtime.     Calcium Carb-Cholecalciferol 600-400 MG-UNIT TABS Take 1 tablet by mouth 2 (two) times daily.     cetirizine (ZYRTEC) 10 MG chewable tablet Chew 10 mg by mouth daily as needed for allergies.     cyanocobalamin 1000 MCG tablet Take 1,000 mcg by mouth daily.     Docusate Sodium (DSS) 100 MG CAPS Take 1 capsule by mouth daily.     ELIQUIS 5 MG TABS tablet Take 5 mg by mouth 2 (two) times daily.     gabapentin (NEURONTIN) 300 MG capsule TAKE 2 CAPSULES BY MOUTH IN THE MORNING AND 3 CAPSULES  IN THE EVENING 450 capsule 0   Iron-Vitamin C (VITRON-C) 65-125 MG TABS Take 1 tablet by mouth daily.     letrozole (FEMARA) 2.5 MG tablet Take 1 tablet (2.5 mg total) by mouth daily. 90 tablet 1   lidocaine-prilocaine (EMLA) cream Apply small amount to port and cover with saran wrap 1-2 hours prior to port access 30 g 5   Loperamide HCl (IMODIUM PO) Take by mouth.     losartan (COZAAR) 50 MG tablet Take 50 mg by mouth daily.     magnesium chloride (SLOW-MAG) 64 MG TBEC SR tablet Take 1 tablet by mouth at bedtime.     Turmeric (QC TUMERIC COMPLEX PO) Take 1 capsule by mouth at bedtime.     diphenoxylate-atropine (LOMOTIL) 2.5-0.025 MG tablet Take 1 tablet by mouth 4 (four) times daily as needed for diarrhea or loose stools. (Patient not taking: Reported on 01/13/2024) 30 tablet 0   LORazepam (ATIVAN) 0.5 MG tablet TAKE 1 TABLET BY MOUTH AT BEDTIME AS NEEDED FOR ANXIETY (Patient not taking: Reported on 01/13/2024) 30 tablet 0   melatonin (MELATONIN MAXIMUM STRENGTH) 5 MG TABS Take 1 tablet by mouth at bedtime as needed. (Patient not taking: Reported on 01/13/2024)     ondansetron (ZOFRAN) 8 MG tablet Take by mouth every 8 (eight) hours as needed for nausea or vomiting. (Patient not taking: Reported on 11/02/2022)     prochlorperazine (COMPAZINE) 5 MG tablet Take 5 mg by mouth every 6 (six) hours as needed for nausea or vomiting. (Patient not taking: Reported on 12/01/2022)     No current facility-administered medications for this visit.   Facility-Administered Medications Ordered in Other Visits  Medication Dose Route Frequency Provider Last Rate Last Admin   sodium chloride flush (NS) 0.9 % injection 10 mL  10 mL Intravenous Once Timmy Forbes, MD         PHYSICAL EXAMINATION: ECOG PERFORMANCE STATUS: 1 - Symptomatic but completely ambulatory Vitals:   01/13/24 1117  BP: (!) 117/52  Pulse: 93  Resp: 18  Temp: 97.6 F (36.4 C)  SpO2: 100%   Filed Weights   01/13/24 1117  Weight: 187 lb 9.6 oz  (85.1 kg)    Physical Exam Constitutional:      General: She is not in acute distress.    Appearance: She is not diaphoretic.  HENT:     Head: Normocephalic and atraumatic.     Nose: Nose normal.     Mouth/Throat:     Pharynx: No oropharyngeal exudate.  Eyes:     General: No scleral icterus.    Pupils: Pupils are equal, round, and reactive to light.  Cardiovascular:     Rate and Rhythm: Normal rate and regular rhythm.     Heart sounds: No murmur heard. Pulmonary:     Effort: Pulmonary effort is normal. No respiratory distress.     Comments: Mild decreased breath sound bilaterally.. L>R Abdominal:     General: There is no distension.     Palpations: Abdomen is soft.     Tenderness: There is no abdominal tenderness.  Musculoskeletal:        General: Normal range of motion.     Cervical back: Normal range of motion and neck supple.  Skin:    General: Skin is warm and dry.     Findings: No erythema.  Neurological:     Mental Status: She is alert and oriented to person, place, and time.     Cranial Nerves: No cranial nerve deficit.     Motor: No abnormal muscle tone.     Coordination: Coordination normal.  Psychiatric:        Mood and Affect: Affect normal.       LABORATORY DATA:  I have reviewed the data as listed     Latest Ref Rng & Units 01/06/2024   10:27 AM 12/24/2023    8:51 AM 11/26/2023    8:50 AM  CBC  WBC 4.0 - 10.5 K/uL 4.7  4.0  3.1   Hemoglobin 12.0 - 15.0 g/dL 16.1  09.6  04.5   Hematocrit 36.0 - 46.0 % 31.3  32.3  30.4   Platelets 150 - 400 K/uL 176  172  158       Latest Ref Rng & Units 12/24/2023    8:51 AM 11/26/2023    8:50 AM 10/29/2023    9:33 AM  CMP  Glucose 70 - 99 mg/dL 409  811  914   BUN 8 - 23 mg/dL 27  18  23    Creatinine 0.44 - 1.00 mg/dL 7.82  9.56  2.13   Sodium 135 - 145 mmol/L 137  136  137   Potassium 3.5 - 5.1 mmol/L 4.0  3.8  3.7   Chloride 98 - 111 mmol/L 105  103  105   CO2 22 - 32 mmol/L 24  26  24    Calcium 8.9 -  10.3 mg/dL 9.2  8.9  9.1   Total Protein 6.5 - 8.1 g/dL 6.7  6.7  6.6   Total Bilirubin 0.0 - 1.2 mg/dL 0.8  1.1  1.1   Alkaline Phos 38 - 126 U/L 56  52  49   AST 15 - 41 U/L 29  32  28   ALT 0 - 44 U/L 21  25  21       RADIOGRAPHIC STUDIES: I have personally reviewed the radiological images as listed and agreed with the findings in the report. US BIOPSY (LIVER) Result Date: 01/06/2024 INDICATION: 73 year old female referred for  biopsy of liver mass EXAM: ULTRASOUND-GUIDED LIVER MASS BIOPSY MEDICATIONS: None. ANESTHESIA/SEDATION: Moderate (conscious) sedation was employed during this procedure. A total of Versed 1.0 mg and Fentanyl 50 mcg was administered intravenously by the radiology nurse. Total intra-service moderate Sedation Time: 13 minutes. The patient's level of consciousness and vital signs were monitored continuously by radiology nursing throughout the procedure under my direct supervision. COMPLICATIONS: None PROCEDURE: Informed written consent was obtained from the patient after a thorough discussion of the procedural risks, benefits and alternatives. All questions were addressed. Maximal Sterile Barrier Technique was utilized including caps, mask, sterile gowns, sterile gloves, sterile drape, hand hygiene and skin antiseptic. A timeout was performed prior to the initiation of the procedure. Ultrasound survey of the right liver lobe performed with images stored and sent to PACs. The right lower thorax/right upper abdomen was prepped with chlorhexidine in a sterile fashion, and a sterile drape was applied covering the operative field. A sterile gown and sterile gloves were used for the procedure. Local anesthesia was provided with 1% Lidocaine. The patient was prepped and draped sterilely and the skin and subcutaneous tissues were generously infiltrated with 1% lidocaine. A 17 gauge introducer needle was then advanced under ultrasound guidance in an intercostal location into the right liver  lobe, targeting heterogeneously of coag asses the right liver. The stylet was removed, and multiple separate 18 gauge core biopsy were retrieved. Samples were placed into formalin for transportation to the lab. Gel-Foam slurry then infused with a small amount of saline for assistance with hemostasis. The needle was removed, and a final ultrasound image was performed. The patient tolerated the procedure well and remained hemodynamically stable throughout. No complications were encountered and no significant blood loss was encounter. IMPRESSION: Status post ultrasound-guided biopsy of right liver mass. Signed, Marciano Settles. Rexine Cater, RPVI [facility] Electronically Signed   By: Myrlene Asper D.O.   On: 01/06/2024 12:45   CT CHEST ABDOMEN PELVIS WO CONTRAST Result Date: 12/24/2023 CLINICAL DATA:  Breast cancer restaging. * Tracking Code: BO * EXAM: CT CHEST, ABDOMEN AND PELVIS WITHOUT CONTRAST TECHNIQUE: Multidetector CT imaging of the chest, abdomen and pelvis was performed following the standard protocol without IV contrast. RADIATION DOSE REDUCTION: This exam was performed according to the departmental dose-optimization program which includes automated exposure control, adjustment of the mA and/or kV according to patient size and/or use of iterative reconstruction technique. COMPARISON:  09/16/2023 CT chest, abdomen and pelvis. FINDINGS: CT CHEST FINDINGS Cardiovascular: Normal heart size. Stable small pericardial effusion. Right internal jugular Port-A-Cath terminates near the cavoatrial junction. Three-vessel coronary atherosclerosis. Atherosclerotic nonaneurysmal thoracic aorta. Normal caliber pulmonary arteries. Mediastinum/Nodes: No significant thyroid nodules. Unremarkable esophagus. Surgical clip again noted in the left axilla. No axillary adenopathy. No pathologically enlarged mediastinal or discrete hilar nodes on these noncontrast images.  Lungs/Pleura: No pneumothorax. Small layering right pleural effusion is stable. No significant left pleural effusion. No acute consolidative airspace disease or lung masses. Indistinct 0.6 cm medial right lower lobe nodule on series 4/image 100, new. No additional significant pulmonary nodules. Musculoskeletal: Patchy sclerotic osseous lesions in the manubrium, sternum, right clavicular head, proximal left humerus, inferior left glenoid, thoracic spine and bilateral ribs are all stable. No new focal osseous lesions in the chest. 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 Mild thoracic spondylosis. CT ABDOMEN PELVIS FINDINGS Hepatobiliary: At least 4 scattered hypodense liver masses, all increased or new. Representative segment 4B left liver 2.7 x 1.9 cm  mass on series 2/image 53, previously 2.1 x 1.9 cm. Representative segment 5 right liver 2.2 x 1.8 cm mass on series 2/image 54, previously 1.2 x 1.2 cm. Inferior right liver 1.1 x 0.7 cm mass in series 2/image 66, new. Normal gallbladder with no radiopaque cholelithiasis. No biliary ductal dilatation. Pancreas: Normal, with no mass or duct dilation. Spleen: Normal size. No mass. Adrenals/Urinary Tract: Left adrenal 1.6 cm nodule with density 36 HU, stable on numerous CTs back to at least 09/03/2021. No right adrenal nodules. No hydronephrosis. No renal stones. Chronic simple parapelvic bilateral renal cysts, for which no follow-up imaging is recommended. Normal bladder. Stomach/Bowel: Normal non-distended stomach. Normal caliber small bowel with no small bowel wall thickening. Normal appendix. Moderate left colonic diverticulosis with no large bowel wall thickening or significant pericolonic fat stranding. Vascular/Lymphatic: Atherosclerotic nonaneurysmal abdominal aorta. Hazy central mesenteric fat stranding is not appreciably changed. No pathologically enlarged lymph nodes in the abdomen or pelvis. Reproductive: Grossly normal uterus.   No adnexal mass. Other: No pneumoperitoneum, ascites or focal fluid collection. Musculoskeletal: Widespread patchy sclerotic osseous lesions throughout the lumbar spine, entire sacrum, bilateral iliac wings, right ischium and bilateral proximal femora, not appreciably changed. No appreciable new focal osseous lesions. Moderate lumbar spondylosis. IMPRESSION: 1. 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 2. New indistinct 0.6 cm medial right lower lobe pulmonary nodule, indeterminate for inflammatory nodule versus pulmonary metastasis. Suggest attention on follow-up chest CT in 3 months. 3. Widespread patchy sclerotic osseous metastatic disease, not appreciably changed. 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 4. Stable small layering right pleural effusion. Stable small pericardial effusion. 5. Three-vessel coronary atherosclerosis. 6. Long-term stability of left adrenal nodule favoring an adenoma. 7. Moderate left colonic diverticulosis. 8.  Aortic Atherosclerosis (ICD10-I70.0). Electronically Signed   By: Levell Reach M.D.   On: 12/24/2023 10:49   DG Chest 2 View Result Date: 11/12/2023 CLINICAL DATA:  Intermittent shortness of breath with exertion. History of breast cancer. EXAM: CHEST - 2 VIEW COMPARISON:  Radiographs 05/14/2023 and 12/29/2022.  CT 09/16/2023. FINDINGS: Right IJ Port-A-Cath tip is unchanged at the level of the superior cavoatrial junction. The heart size and mediastinal contours are stable with aortic atherosclerosis. Unchanged chronic basilar scarring bilaterally and a chronic small right pleural effusion. No new airspace disease, edema or pneumothorax. The bones appear unchanged with chronic thoracic compression deformities and scattered sclerotic metastases. IMPRESSION: Stable radiographic appearance of the chest. No evidence of acute cardiopulmonary process. Electronically Signed   By: Elmon Hagedorn M.D.   On: 11/12/2023 11:22

## 2024-01-14 ENCOUNTER — Other Ambulatory Visit: Payer: Self-pay

## 2024-01-15 ENCOUNTER — Other Ambulatory Visit: Payer: Self-pay

## 2024-01-19 NOTE — Progress Notes (Signed)
 Pharmacist Chemotherapy Monitoring - Initial Assessment    Anticipated start date: 02/02/24   The following has been reviewed per standard work regarding the patient's treatment regimen: The patient's diagnosis, treatment plan and drug doses, and organ/hematologic function Lab orders and baseline tests specific to treatment regimen  The treatment plan start date, drug sequencing, and pre-medications Prior authorization status  Patient's documented medication list, including drug-drug interaction screen and prescriptions for anti-emetics and supportive care specific to the treatment regimen The drug concentrations, fluid compatibility, administration routes, and timing of the medications to be used The patient's access for treatment and lifetime cumulative dose history, if applicable  The patient's medication allergies and previous infusion related reactions, if applicable   Changes made to treatment plan:  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 Liver biopsy confirmed metastatic breast carcinoma, ER 95%,PR 0% HER2 low (1+) I will obtain tissue NGS testing.  I recommend to switch to next line treatment with Enhertu.  She may continue Abemaciclib  150mg  BID until next week to bridge to new treatments  Follow up needed:  Enhertu to start at 5.4mg /kg   Amber Blevins, RPH, 01/19/2024  2:20 PM

## 2024-01-21 ENCOUNTER — Inpatient Hospital Stay

## 2024-01-21 ENCOUNTER — Other Ambulatory Visit

## 2024-01-21 ENCOUNTER — Ambulatory Visit
Admission: RE | Admit: 2024-01-21 | Discharge: 2024-01-21 | Disposition: A | Discharge status: Home / Self Care | Source: Ambulatory Visit | Attending: Oncology | Admitting: Oncology

## 2024-01-21 ENCOUNTER — Encounter

## 2024-01-21 ENCOUNTER — Ambulatory Visit (HOSPITAL_COMMUNITY)

## 2024-01-21 DIAGNOSIS — Z79899 Other long term (current) drug therapy: Secondary | ICD-10-CM | POA: Diagnosis not present

## 2024-01-21 DIAGNOSIS — Z5181 Encounter for therapeutic drug level monitoring: Secondary | ICD-10-CM | POA: Diagnosis present

## 2024-01-21 DIAGNOSIS — Z853 Personal history of malignant neoplasm of breast: Secondary | ICD-10-CM | POA: Insufficient documentation

## 2024-01-21 DIAGNOSIS — Z01810 Encounter for preprocedural cardiovascular examination: Secondary | ICD-10-CM | POA: Diagnosis present

## 2024-01-21 DIAGNOSIS — C7951 Secondary malignant neoplasm of bone: Secondary | ICD-10-CM | POA: Insufficient documentation

## 2024-01-21 DIAGNOSIS — C50919 Malignant neoplasm of unspecified site of unspecified female breast: Secondary | ICD-10-CM | POA: Insufficient documentation

## 2024-01-21 DIAGNOSIS — I4891 Unspecified atrial fibrillation: Secondary | ICD-10-CM | POA: Diagnosis not present

## 2024-01-21 DIAGNOSIS — I10 Essential (primary) hypertension: Secondary | ICD-10-CM | POA: Diagnosis not present

## 2024-01-21 MED ORDER — TECHNETIUM TC 99M-LABELED RED BLOOD CELLS IV KIT: 21.7000 | PACK | Freq: Once | INTRAVENOUS | Status: DC | PRN

## 2024-01-21 MED ORDER — GADOBUTROL 1 MMOL/ML IV SOLN
8.0000 mL | Freq: Once | INTRAVENOUS | Status: AC | PRN
  Administered 2024-01-21: 8 mL via INTRAVENOUS

## 2024-01-31 ENCOUNTER — Encounter: Payer: Self-pay | Admitting: Oncology

## 2024-01-31 ENCOUNTER — Telehealth: Payer: Self-pay | Admitting: Pharmacy Technician

## 2024-01-31 ENCOUNTER — Other Ambulatory Visit: Payer: Self-pay | Admitting: Oncology

## 2024-01-31 ENCOUNTER — Other Ambulatory Visit (HOSPITAL_COMMUNITY): Payer: Self-pay

## 2024-01-31 ENCOUNTER — Telehealth: Payer: Self-pay | Admitting: Pharmacist

## 2024-01-31 ENCOUNTER — Other Ambulatory Visit: Payer: Self-pay

## 2024-01-31 DIAGNOSIS — C50919 Malignant neoplasm of unspecified site of unspecified female breast: Secondary | ICD-10-CM

## 2024-01-31 MED ORDER — PIQRAY (300 MG DAILY DOSE) 2 X 150 MG PO TBPK: 300.0000 mg | ORAL_TABLET | Freq: Every day | ORAL | 0 refills | Status: DC

## 2024-01-31 NOTE — Telephone Encounter (Signed)
 Oral Oncology Patient Advocate Encounter   Received notification that prior authorization for Piqray is required.   PA submitted on 01/31/2024 Key [id number] Status is pending     Patty Benjaman Branch, CPhT Oncology Pharmacy Patient Advocate [facility] Direct Number: [phone] Fax: [phone]

## 2024-01-31 NOTE — Telephone Encounter (Signed)
 Clinical Pharmacist Practitioner Encounter   Received new prescription for Piqray (alpelisib) for the treatment of metastatic breast cancer, PIK3CA mutation, in conjunction with fulvestrant, planned duration until disease progression or unacceptable drug toxicity.  Prescription dose and frequency assessed. A1c from 09/03/23 was normal, but historic A1c in chart indicate history of predibetes (A1c from 03/15/19, 11/13/19, 11/22/20). Will repeat A1c for new baseline. Could consider pre-emptive metformin.   Current medication list in Epic reviewed, no DDIs with alpelisib identified.  Evaluated chart and no patient barriers to medication adherence identified.   Prescription has been e-scribed to the [facility] for benefits analysis and approval.  Oral Oncology Clinic will continue to follow for insurance authorization, copayment issues, initial counseling and start date.   Devonne Lalani N. Rozalyn Osland, PharmD, BCOP, CPP Hematology/Oncology Clinical Pharmacist [facility] Oral Chemotherapy Navigation Clinic [phone]  01/31/2024 1:03 PM

## 2024-01-31 NOTE — Telephone Encounter (Signed)
 Testing results given to MD and copy sent to scan

## 2024-01-31 NOTE — Telephone Encounter (Signed)
 Oral Oncology Patient Advocate Encounter  Prior Authorization for Amber Blevins has been approved.    PA# [id number] Effective dates: 09/29/2023 through 09/27/2024  Patients co-pay is $0.    Thresa Floor, CPhT Oncology Pharmacy Patient Advocate [facility] Direct Number: [phone] Fax: [phone]

## 2024-02-01 ENCOUNTER — Other Ambulatory Visit (HOSPITAL_COMMUNITY): Payer: Self-pay

## 2024-02-01 ENCOUNTER — Other Ambulatory Visit: Payer: Self-pay

## 2024-02-01 ENCOUNTER — Encounter: Payer: Self-pay | Admitting: Oncology

## 2024-02-01 NOTE — Progress Notes (Signed)
 Verzenio  therapy was discontinued, provider is switching to Enhertu.  Disenrolling from Specialty Pharmacy services.

## 2024-02-02 ENCOUNTER — Inpatient Hospital Stay

## 2024-02-02 ENCOUNTER — Telehealth: Payer: Self-pay | Admitting: Pharmacist

## 2024-02-02 ENCOUNTER — Telehealth: Payer: Self-pay | Admitting: Pharmacy Technician

## 2024-02-02 ENCOUNTER — Inpatient Hospital Stay: Admitting: Oncology

## 2024-02-02 ENCOUNTER — Encounter: Payer: Self-pay | Admitting: Oncology

## 2024-02-02 ENCOUNTER — Other Ambulatory Visit: Payer: Self-pay | Admitting: Pharmacist

## 2024-02-02 ENCOUNTER — Inpatient Hospital Stay: Attending: Oncology

## 2024-02-02 ENCOUNTER — Other Ambulatory Visit: Payer: Self-pay

## 2024-02-02 ENCOUNTER — Other Ambulatory Visit: Payer: Self-pay | Admitting: Pharmacy Technician

## 2024-02-02 ENCOUNTER — Telehealth: Payer: Self-pay

## 2024-02-02 ENCOUNTER — Other Ambulatory Visit (HOSPITAL_COMMUNITY): Payer: Self-pay

## 2024-02-02 ENCOUNTER — Inpatient Hospital Stay: Attending: Oncology | Admitting: Oncology

## 2024-02-02 VITALS — BP 147/75 | HR 93 | Temp 98.1°F | Resp 18 | Wt 190.7 lb

## 2024-02-02 DIAGNOSIS — Z170 Estrogen receptor positive status [ER+]: Secondary | ICD-10-CM | POA: Insufficient documentation

## 2024-02-02 DIAGNOSIS — C787 Secondary malignant neoplasm of liver and intrahepatic bile duct: Secondary | ICD-10-CM | POA: Insufficient documentation

## 2024-02-02 DIAGNOSIS — C7951 Secondary malignant neoplasm of bone: Secondary | ICD-10-CM | POA: Insufficient documentation

## 2024-02-02 DIAGNOSIS — C50919 Malignant neoplasm of unspecified site of unspecified female breast: Secondary | ICD-10-CM | POA: Diagnosis not present

## 2024-02-02 DIAGNOSIS — R978 Other abnormal tumor markers: Secondary | ICD-10-CM | POA: Insufficient documentation

## 2024-02-02 DIAGNOSIS — G620 Drug-induced polyneuropathy: Secondary | ICD-10-CM

## 2024-02-02 DIAGNOSIS — T451X5A Adverse effect of antineoplastic and immunosuppressive drugs, initial encounter: Secondary | ICD-10-CM

## 2024-02-02 DIAGNOSIS — R7401 Elevation of levels of liver transaminase levels: Secondary | ICD-10-CM | POA: Diagnosis not present

## 2024-02-02 DIAGNOSIS — Z5111 Encounter for antineoplastic chemotherapy: Secondary | ICD-10-CM

## 2024-02-02 DIAGNOSIS — Z1721 Progesterone receptor positive status: Secondary | ICD-10-CM | POA: Insufficient documentation

## 2024-02-02 DIAGNOSIS — Z1732 Human epidermal growth factor receptor 2 negative status: Secondary | ICD-10-CM | POA: Diagnosis not present

## 2024-02-02 DIAGNOSIS — Z79811 Long term (current) use of aromatase inhibitors: Secondary | ICD-10-CM | POA: Insufficient documentation

## 2024-02-02 DIAGNOSIS — R7303 Prediabetes: Secondary | ICD-10-CM | POA: Insufficient documentation

## 2024-02-02 DIAGNOSIS — Z923 Personal history of irradiation: Secondary | ICD-10-CM | POA: Insufficient documentation

## 2024-02-02 DIAGNOSIS — Z853 Personal history of malignant neoplasm of breast: Secondary | ICD-10-CM

## 2024-02-02 DIAGNOSIS — Z95828 Presence of other vascular implants and grafts: Secondary | ICD-10-CM

## 2024-02-02 LAB — CMP (CANCER CENTER ONLY)
ALT: 133 U/L — ABNORMAL HIGH (ref 0–44)
AST: 121 U/L — ABNORMAL HIGH (ref 15–41)
Albumin: 3.7 g/dL (ref 3.5–5.0)
Alkaline Phosphatase: 96 U/L (ref 38–126)
Anion gap: 11 (ref 5–15)
BUN: 20 mg/dL (ref 8–23)
CO2: 24 mmol/L (ref 22–32)
Calcium: 9.1 mg/dL (ref 8.9–10.3)
Chloride: 105 mmol/L (ref 98–111)
Creatinine: 0.99 mg/dL (ref 0.44–1.00)
GFR, Estimated: 60 mL/min — ABNORMAL LOW (ref >60)
Glucose, Bld: 132 mg/dL — ABNORMAL HIGH (ref 70–99)
Potassium: 3.8 mmol/L (ref 3.5–5.1)
Sodium: 140 mmol/L (ref 135–145)
Total Bilirubin: 1 mg/dL (ref 0.0–1.2)
Total Protein: 6.8 g/dL (ref 6.5–8.1)

## 2024-02-02 LAB — CBC WITH DIFFERENTIAL (CANCER CENTER ONLY)
Abs Immature Granulocytes: 0.03 10*3/uL (ref 0.00–0.07)
Basophils Absolute: 0.1 10*3/uL (ref 0.0–0.1)
Basophils Relative: 1 %
Eosinophils Absolute: 0.1 10*3/uL (ref 0.0–0.5)
Eosinophils Relative: 1 %
HCT: 31.5 % — ABNORMAL LOW (ref 36.0–46.0)
Hemoglobin: 10.3 g/dL — ABNORMAL LOW (ref 12.0–15.0)
Immature Granulocytes: 1 %
Lymphocytes Relative: 16 %
Lymphs Abs: 0.8 10*3/uL (ref 0.7–4.0)
MCH: 32.7 pg (ref 26.0–34.0)
MCHC: 32.7 g/dL (ref 30.0–36.0)
MCV: 100 fL (ref 80.0–100.0)
Monocytes Absolute: 0.5 10*3/uL (ref 0.1–1.0)
Monocytes Relative: 11 %
Neutro Abs: 3.5 10*3/uL (ref 1.7–7.7)
Neutrophils Relative %: 70 %
Platelet Count: 185 10*3/uL (ref 150–400)
RBC: 3.15 MIL/uL — ABNORMAL LOW (ref 3.87–5.11)
RDW: 13.8 % (ref 11.5–15.5)
WBC Count: 5 10*3/uL (ref 4.0–10.5)
nRBC: 0.4 % — ABNORMAL HIGH (ref 0.0–0.2)

## 2024-02-02 LAB — HEMOGLOBIN A1C
Hgb A1c MFr Bld: 4.7 % — ABNORMAL LOW (ref 4.8–5.6)
Mean Plasma Glucose: 88.19 mg/dL

## 2024-02-02 MED ORDER — DIPHENOXYLATE-ATROPINE 2.5-0.025 MG PO TABS: 1.0000 | ORAL_TABLET | Freq: Four times a day (QID) | ORAL | 0 refills | Status: AC | PRN

## 2024-02-02 MED ORDER — CAPIVASERTIB 200 MG PO TABS
400.0000 mg | ORAL_TABLET | Freq: Two times a day (BID) | ORAL | 0 refills | Status: DC
  Filled 2024-02-02: qty 64, 28d supply, fill #0

## 2024-02-02 MED ORDER — HEPARIN SOD (PORK) LOCK FLUSH 100 UNIT/ML IV SOLN
500.0000 [IU] | Freq: Once | INTRAVENOUS | Status: AC
  Administered 2024-02-02: 500 [IU] via INTRAVENOUS
  Filled 2024-02-02: qty 5

## 2024-02-02 MED ORDER — ZOLEDRONIC ACID 4 MG/100ML IV SOLN
4.0000 mg | Freq: Once | INTRAVENOUS | Status: AC
  Administered 2024-02-02: 4 mg via INTRAVENOUS
  Filled 2024-02-02: qty 100

## 2024-02-02 MED ORDER — ONDANSETRON HCL 8 MG PO TABS: 8.0000 mg | ORAL_TABLET | Freq: Three times a day (TID) | ORAL | 1 refills | Status: AC | PRN

## 2024-02-02 MED ORDER — SODIUM CHLORIDE 0.9 % IV SOLN
INTRAVENOUS | Status: DC
  Filled 2024-02-02: qty 250

## 2024-02-02 MED ORDER — FULVESTRANT 250 MG/5ML IM SOSY
500.0000 mg | PREFILLED_SYRINGE | Freq: Once | INTRAMUSCULAR | Status: AC
  Administered 2024-02-02: 500 mg via INTRAMUSCULAR
  Filled 2024-02-02: qty 10

## 2024-02-02 NOTE — Telephone Encounter (Signed)
 Oral Oncology Patient Advocate Encounter   Received notification that prior authorization for Truqap is required.   PA submitted on 02/02/2024 Key [id number] Status is pending     Patty Benjaman Branch, CPhT Oncology Pharmacy Patient Advocate [facility] Direct Number: [phone] Fax: [phone]

## 2024-02-02 NOTE — Assessment & Plan Note (Signed)
#  Grade 2 neuropathy, continue gabapentin,  Continue gabapentin 600 mg in a.m., she lower the night time dose to 900 mg  Declined acupuncture

## 2024-02-02 NOTE — Telephone Encounter (Signed)
 Per Dr. Wilhelmenia Harada, pt has been scheduld for the following appts:   5/14: labs -CMP (fasting) 5/22: Lab -CBC, CMP (fasting )/ MD/ +/- IVF/ fulvestrant   Spoke to pt and informed her of appt details and the need to fast for labwork. Pt verbalized understanding.

## 2024-02-02 NOTE — Telephone Encounter (Signed)
 MD changing plan, patient will proceed with Truqap instead.

## 2024-02-02 NOTE — Progress Notes (Signed)
 Specialty Pharmacy Initial Fill Coordination Note  Amber Blevins is a 73 y.o. female contacted today regarding refills of specialty medication(s) Capivasertib (TRUQAP) .  Patient requested Delivery (Leave on backdoor - screened porch)  on 02/04/24  to verified address [address] [place] [address]   Medication will be filled on 01/04/2024.   Patient is aware of $0 copayment.   Patty Benjaman Branch, CPhT Oncology Pharmacy Patient Advocate [facility] Direct Number: [phone] Fax: [phone]

## 2024-02-02 NOTE — Assessment & Plan Note (Signed)
 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 Continue calcium and vitamin D supplementation. Marland Kitchen  MRI brain showed enlarging right frontal calvarium - s/p RT  Zometa Q2 months

## 2024-02-02 NOTE — Telephone Encounter (Signed)
 Oral Oncology Patient Advocate Encounter  Prior Authorization for Gustav Lehmann has been approved.    PA# [id number]  Effective dates: 09/29/2023 through 09/27/2024  Patients co-pay is $0.    Thresa Floor, CPhT Oncology Pharmacy Patient Advocate [facility] Direct Number: [phone] Fax: [phone]

## 2024-02-02 NOTE — Assessment & Plan Note (Addendum)
 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  I will hold off plan to start on Enhertu, and recommend fulvestrant and Capivasertib [400mg  Q12h on D1-4 of each week]. Rationale and side effects were reviewed with patient.   Check A1c. Stop letrozole , she will get fulvestrant today.  Check fasting glucose on D3 or 4 of weeks 1, 2, 4, 6, 8.

## 2024-02-02 NOTE — Telephone Encounter (Signed)
 Clinical Pharmacist Practitioner Encounter   Received new prescription for Truqap (Capivasertib) for the treatment of metastatic breast cancer, PIK3CA mutation, in conjunction with fulvestrant, planned duration until disease progression or unacceptable drug toxicity.   Prescription dose and frequency assessed.   Hyperglycemia monitoring:  Fasting glucose: Prior to treatment, on day 3 or 4 of the dosing week during weeks 1, 2, 4, 6 and 8; then monthly while on treatment, and as clinically indicated A1c was checked today, and will need to be checked every three months during treatment, and as clinically indicated   Current medication list in Epic reviewed, no DDIs with capivasertib identified.  Evaluated chart and no patient barriers to medication adherence identified.   Prescription has been e-scribed to the [facility] for benefits analysis and approval.  Oral Oncology Clinic will continue to follow for insurance authorization, copayment issues, initial counseling and start date.   Derrien Anschutz N. Itzae Mccurdy, PharmD, BCOP, CPP Hematology/Oncology Clinical Pharmacist [facility] Oral Chemotherapy Navigation Clinic [phone]  02/02/2024 12:13 PM

## 2024-02-02 NOTE — Assessment & Plan Note (Signed)
 Chemotherapy plan as listed above

## 2024-02-02 NOTE — Telephone Encounter (Signed)
 Patient successfully OnBoarded and drug education provided by pharmacist. Medication scheduled to be shipped on 02/03/2024 for delivery on 02/04/2024 from [facility] to patient's address. Patient also knows to call me at [phone] with any questions or concerns regarding receiving medication or if there is any unexpected change in co-pay.   Patty Benjaman Branch, CPhT Oncology Pharmacy Patient Advocate [facility] Direct Number: [phone] Fax: [phone]

## 2024-02-02 NOTE — Telephone Encounter (Signed)
 Clinical Pharmacist Practitioner Encounter   Patient will start her Capivasertib on 02/07/24.   Patient Education I spoke with patient for overview of new oral chemotherapy medication: Truqap (Capivasertib) for the treatment of metastatic breast cancer, PIK3CA mutation, in conjunction with fulvestrant, planned duration until disease progression or unacceptable drug toxicity.   Counseled patient on administration, dosing, side effects, monitoring, drug-food interactions, safe handling, storage, and disposal. Patient will take 2 tablets (400 mg total) by mouth 2 (two) times daily. Take for 4 days, then hold for 3 days. Repeat every 7 days.   Side effects include but not limited to: hyperglycemia, rash, diarrhea, nausea, decreased wbc.   Hyperglycemia: reviewed s/sx of hyperglycemia, asked patient to fast prior to her lab appts Rash: patient has cetirizine, which she already has at home, to help with rash prevention. She know to call the office if a rash occurs Nausea: rx sent for ondansetron  to patient's local pharmacy Diarrhea: patient knows to use loperamide as needed and call the office if they are having four or more loose stool per day  Reviewed with patient importance of keeping a medication schedule and plan for any missed doses.  After discussion with patient no patient barriers to medication adherence identified.   Amber Blevins voiced understanding and appreciation. All questions answered. Medication handout provided.  Other: MD would like patient to hold her atorvastatin  given the recent increase in LFTs, patient instructed to hold her atorvastatin    Provided patient with Oral Chemotherapy Navigation Clinic phone number. Patient knows to call the office with questions or concerns. Oral Chemotherapy Navigation Clinic will continue to follow.  Amber Blevins, PharmD, BCOP, CPP Hematology/Oncology Clinical Pharmacist [facility] Oral Chemotherapy Navigation  Clinic [phone]  02/02/2024 2:17 PM

## 2024-02-02 NOTE — Progress Notes (Addendum)
 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 Continue calcium  and vitamin D supplementation. Aaron Aas  MRI brain showed enlarging right frontal calvarium - s/p RT  Zometa  Q2 months  Primary malignant neoplasm of breast with metastasis (HCC) 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  I will hold off plan to start on Enhertu, and recommend fulvestrant and Capivasertib [400mg  Q12h on D1-4 of each week]. Rationale and side effects were reviewed with patient.   Check A1c. Stop letrozole , she will get fulvestrant today.  Check fasting glucose on D3 or 4 of weeks 1, 2, 4, 6, 8.     Transaminitis New for her. ? Cancer progression vs other etiologies.  Check hepatitis panel.  Recommend pt to temporarily hold Statin.        Orders Placed  This Encounter  Procedures   CBC with Differential (Cancer Center Only)    Standing Status:   Future    Expected Date:   02/16/2024    Expiration Date:   02/01/2025   CMP (Cancer Center only)    Standing Status:   Future    Expected Date:   02/16/2024    Expiration Date:   02/01/2025   Ambulatory referral to Genetics    Referral Priority:   Routine    Referral Type:   Consultation    Referral Reason:   Specialty Services Required    Number of Visits Requested:   1    Follow up 2-3 weeks lab MD  All questions were answered. The patient knows to call the clinic with any problems, questions or concerns.  Timmy Forbes, MD, PhD [facility] Hematology Oncology 02/02/2024      HISTORY OF PRESENTING ILLNESS:   Amber Blevins is a  73 y.o.  female with PMH listed below presents for follow up for treatment of metastatic breast cancer.   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 Additional metastatic involvement T12, L1 through L3.  No primary tumor site identified. Top differential considerations are Metastatic Disease Unknown primary, less likely Lymphoma or Multiple Myeloma.   2. Superimposed lumbar spine degeneration, including degenerative moderate to severe left L3 and L4 nerve level impingement from disc herniation.      07/02/2021 Imaging   CT chest abdomen pelvis showed  innumerable small pulmonary and pleural nodules consistent with diffuse metastasis.  Associated with probable malignant pleural effusion.  Mediastinal and hilar lymphadenopathy consistent with metastatic disease.  Hepatic and bilateral adrenal gland metastasis.  Diffuse extensive destructive metastatic bone disease involving pelvis.   07/12/2021 Cancer Staging   Staging form: Breast, AJCC 8th Edition - Clinical stage from 07/12/2021: Stage IV (rcTX, cNX, pM1, ER+, PR+, HER2-) - Signed by Timmy Forbes, MD on 03/08/2022 Stage prefix: Recurrence   07/17/2021 Initial Diagnosis   Metastatic breast cancer -history of breast cancer, diagnosed in 2010 Left-sided T2 (2.4cm) N0, ER/PR positive, HER2 negative IDC of the breast, s/p lumpectomy by Dr.Meyer at [facility]. Oncotype score of 11 who completed radiation and she finished 10 years of Femara  from 05/2009 and stopped in 2020.  -07/12/2021 patient underwent right thoracentesis.  Cytology was positive for metastatic carcinoma, compatible with breast origin.  ER/PR +, HER2 negative -07/16/2021, patient underwent iliac bone biopsy.  Positive for metastatic carcinoma.   07/21/2021 - 09/16/2021 Chemotherapy   Patient is on Treatment Plan : BREAST Paclitaxel  D1,8,15 q28d     09/04/2021 Imaging   CT chest abdomen pelvis showed improvement of the pulmonary nodularity and thickening in the left and the right lung.  Persistent nodular interstitial and pleural thickening remains.  Improvement of mediastinal and hilar adenopathy, hepatic metastatic lesions are similar.  Adrenal gland metastasis is similar.  Interval increase in the sclerotic bone metastasis.    09/30/2021 -  Chemotherapy   started on abemaciclib  100 mg twice daily and letrozole .   02/24/2022 Imaging    CT chest abdomen pelvis with contrast showed unchanged bilateral pleural effusion with associated intralobular septal thickening of the lung bases.  Without discrete nodularity consistent with stable appearance  of treated pleural and lymphangitic metastatic disease.  Stable or minimally diminished subcentimeter liver lesions.  Unchanged left adrenal metastasis.  Unchanged widespread sclerotic osseous metastatic disease involving the included axial and appendicular skeleton.  No evidence of new metastatic disease in the chest abdomen or pelvis.  Coronary artery disease.   03/06/2022 Procedure   Ultrasound guided thoracentesis, removal of pleural fluid.    05/29/2022 Imaging   CT chest abdomen pelvis 1. Stable CT of the chest, abdomen and pelvis. 2. Unchanged appearance of bilateral pleural effusions, pleural nodularity, and interlobular septal thickening compatible with pleural and lymphangitic metastatic disease. 3. Stable appearance of multifocal sclerotic bone metastases. 4. Multifocal low-attenuation liver lesions are unchanged in the interval. 5. Stable appearance of left adrenal gland metastases. 6. Stable appearance of small pericardial effusion.7. No new or progressive disease identified.8. Aortic Atherosclerosis (ICD10-I70.0).   06/06/2022 Imaging   Bone scan Constellation of findings are consistent with multifocal osseous metastatic disease throughout the axial and appendicular skeleton.Of note, osseous metastatic disease involves the RIGHT greater than LEFT femur. Consider evaluation with dedicated hip and femur radiographs to evaluate for overall disease burden and potential risk for pathologic fracture.   08/31/2022 Imaging   CT chest abdomen pelvis w contrast 1. Unchanged small right, trace left pleural effusions with associated diffuse interlobular septal thickening and fine fissural and perilymphatic  nodularity throughout the lungs, findings consistent with lymphangitic metastatic disease. 2. Stable or slightly diminished very subtle hypodense lesion of the central right lobe of the liver, hepatic segment VIII measuring 0.6 cm, previously 0.8 cm. Additional unchanged, very subtle  hypodense lesion of hepatic segment IV B measuring 1.1 cm. 3. Unchanged widespread sclerotic osseous metastatic disease throughout the included axial and proximal appendicular skeleton, particularly dense in the lower lumbar spine and sacrum. 4. Unchanged left adrenal nodule measuring 1.7 x 1.1 cm. 5. Overall constellation of findings is consistent with stable metastatic disease. 6. Coronary artery disease.    12/01/2022 Miscellaneous   [facility] liquid biopsy - PIK3CA mutation. 0.1% Not enough tissue for tissue NGS   03/08/2023 Imaging   Bone scan showed Multifocal osseous metastatic disease. One new focus along the right side of the skull   08/23/2023 - 09/06/2023 Radiation Therapy   Skull radiation.    12/24/2023 Imaging   CT chest abdomen pelvis wo contrast showed 1. 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 2. New indistinct 0.6 cm medial right lower lobe pulmonary nodule, indeterminate for inflammatory nodule versus pulmonary metastasis. Suggest attention on follow-up chest CT in 3 months. 3. Widespread patchy sclerotic osseous metastatic disease, not appreciably changed. 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 4. Stable small layering right pleural effusion. Stable small pericardial effusion. 5. Three-vessel coronary atherosclerosis. 6. Long-term stability of left adrenal nodule favoring an adenoma. 7. Moderate left colonic diverticulosis. 8.  Aortic Atherosclerosis (ICD10-I70.0).    02/02/2024 - 02/02/2024 Chemotherapy   Patient is on Treatment Plan : BREAST Fam-Trastuzumab Deruxtecan-nxki (Enhertu) (5.4) q21d      Procedure   Liver biopsy Pathology showed metasatic adenocarcinoma Immunohistochemical stains show that the tumor cells are positive for CK7 and GATA3 while they are negative for CK20 and CDX2, consistent with patient's clinical history of primary breast carcinoma.     ER 95%, PR 0%, HER2 low (1+), Ki 67 5%    Metastasis to bone (HCC)  02/04/2022 Initial Diagnosis   Metastasis to bone (HCC)   02/02/2024 - 02/02/2024 Chemotherapy   Patient is on Treatment Plan : BREAST Fam-Trastuzumab Deruxtecan-nxki (Enhertu) (5.4) q21d      Patient follows up with cardiology for evaluation of bradycardia.  Cardiology notes reviewed.  72-hour Holter monitor revealed revealed predominant sinus rhythm with mean heart rate of 96 bpm, heart rate range 53 to 129 bpm, frequent premature ventricular contractions (27% burden), and intermittent atrial fibrillation (5% burden) with longest episode 3 hours. CHA2DS2-VASc score of 3, per cardiology recommendation patient was started on Eliquis 5 mg twice daily.     INTERVAL HISTORY Amber Blevins is a 72 y.o. female who has above history reviewed by me today presents for follow up visit for metastatic breast cancer Occasional back and hip pain..  Denies any headache, nausea vomiting diarrhea.  Fever or chills. She stopped Abemaciclib  150mg  BID Denies abdominal pain, recent tylenol  use, nausea vomiting.   .  Review of Systems  Constitutional:  Negative for appetite change, chills, fatigue and fever.  HENT:   Negative for hearing loss and voice change.        Nasal congestion  Eyes:  Negative for eye problems.  Respiratory:  Positive for cough. Negative for chest tightness and shortness of breath.   Cardiovascular:  Negative for chest pain.  Gastrointestinal:  Negative for abdominal distention, abdominal pain and blood in stool.  Endocrine: Negative for hot flashes.  Genitourinary:  Negative for difficulty urinating  and frequency.   Musculoskeletal:  Negative for arthralgias.  Skin:  Negative for itching and rash.  Neurological:  Positive for numbness. Negative for extremity weakness.  Hematological:  Negative for adenopathy.  Psychiatric/Behavioral:  Negative for confusion.      MEDICAL HISTORY:  Past Medical History:  Diagnosis Date   A-fib (HCC)    Breast  cancer (HCC)    COPD (chronic obstructive pulmonary disease) (HCC)    Family history of adverse reaction to anesthesia    brother has problem with coming out of anesthesia   High cholesterol    Hypertension    Personal history of radiation therapy    Pre-diabetes     SURGICAL HISTORY: Past Surgical History:  Procedure Laterality Date   BREAST LUMPECTOMY Left    2010   COLONOSCOPY     EYE SURGERY Left    Cataract surgery   PORTACATH PLACEMENT N/A 08/01/2021   Procedure: INSERTION PORT-A-CATH;  Surgeon: Eldred Grego, MD;  Location: [facility] ORS;  Service: General;  Laterality: N/A;   thorocentesis Right 07/10/2021   and another on 07/25/21    SOCIAL HISTORY: Social History   Socioeconomic History   Marital status: Married    Spouse name: Not on file   Number of children: Not on file   Years of education: Not on file   Highest education level: Not on file  Occupational History   Not on file  Tobacco Use   Smoking status: Former    Current packs/day: 0.00    Average packs/day: 1 pack/day for 25.0 years (25.0 ttl pk-yrs)    Types: Cigarettes    Start date: 47    Quit date: 71    Years since quitting: 35.3   Smokeless tobacco: Never  Vaping Use   Vaping status: Never Used  Substance and Sexual Activity   Alcohol use: Not Currently   Drug use: Never   Sexual activity: Not on file  Other Topics Concern   Not on file  Social History Narrative   Not on file   Social Drivers of Health   Financial Resource Strain: Low Risk  (07/26/2023)   Received from [facility]   Overall Financial Resource Strain (CARDIA)    Difficulty of Paying Living Expenses: Not hard at all  Food Insecurity: No Food Insecurity (07/26/2023)   [facility] from [facility]   Hunger Vital Sign    Worried About Running Out of Food in the Last Year: Never true    Ran Out of Food in the Last Year: Never true  Transportation Needs: No Transportation Needs  (07/26/2023)   Received from [facility] - Transportation    In the past 12 months, has lack of transportation kept you from medical appointments or from getting medications?: No    Lack of Transportation (Non-Medical): No  Physical Activity: Not on file  Stress: Not on file  Social Connections: Not on file  Intimate Partner Violence: Not on file    FAMILY HISTORY: Family History  Problem Relation Age of Onset   Cancer Mother        gynecological   Lung cancer Mother    Diabetes Father    Heart disease Father    Parkinson's disease Father    Brain cancer Brother    Bladder Cancer Brother    Pulmonary disease Brother    Rheumatic fever Brother    Breast cancer Neg Hx     ALLERGIES:  is  allergic to green tea (camellia sinensis), melaleuca viridiflora, tea tree oil, and lisinopril.  MEDICATIONS:  Current Outpatient Medications  Medication Sig Dispense Refill   acetaminophen  (TYLENOL ) 650 MG CR tablet Take 650 mg by mouth every 8 (eight) hours as needed for pain.     atorvastatin  (LIPITOR) 40 MG tablet Take 40 mg by mouth at bedtime.     azelastine (ASTELIN) 0.1 % nasal spray Place 1 spray into both nostrils daily as needed for rhinitis. Use in each nostril as directed     Boswellia-Glucosamine-Vit D (OSTEO BI-FLEX-GLUCOS/5-LOXIN) TABS Take 1 capsule by mouth in the morning and at bedtime.     Calcium  Carb-Cholecalciferol 600-400 MG-UNIT TABS Take 1 tablet by mouth 2 (two) times daily.     cetirizine (ZYRTEC) 10 MG chewable tablet Chew 10 mg by mouth daily as needed for allergies.     cyanocobalamin 1000 MCG tablet Take 1,000 mcg by mouth daily.     Docusate Sodium (DSS) 100 MG CAPS Take 1 capsule by mouth daily.     ELIQUIS 5 MG TABS tablet Take 5 mg by mouth 2 (two) times daily.     gabapentin  (NEURONTIN ) 300 MG capsule TAKE 2 CAPSULES BY MOUTH IN THE MORNING AND 3 CAPSULES IN THE EVENING 450 capsule 0   Iron-Vitamin C (VITRON-C) 65-125 MG TABS Take  1 tablet by mouth daily.     lidocaine -prilocaine  (EMLA ) cream Apply small amount to port and cover with saran wrap 1-2 hours prior to port access 30 g 5   Loperamide HCl (IMODIUM PO) Take by mouth.     losartan (COZAAR) 50 MG tablet Take 50 mg by mouth daily.     capivasertib (TRUQAP) 200 MG tablet Take 2 tablets (400 mg total) by mouth 2 (two) times daily. Take for 4 days, then hold for 3 days. Repeat every 7 days. 64 tablet 0   diphenoxylate -atropine  (LOMOTIL ) 2.5-0.025 MG tablet Take 1 tablet by mouth 4 (four) times daily as needed for diarrhea or loose stools. (Patient not taking: Reported on 02/02/2024) 30 tablet 0   LORazepam  (ATIVAN ) 0.5 MG tablet TAKE 1 TABLET BY MOUTH AT BEDTIME AS NEEDED FOR ANXIETY (Patient not taking: Reported on 02/02/2024) 30 tablet 0   magnesium chloride (SLOW-MAG) 64 MG TBEC SR tablet Take 1 tablet by mouth at bedtime. (Patient not taking: Reported on 02/02/2024)     melatonin (MELATONIN MAXIMUM STRENGTH) 5 MG TABS Take 1 tablet by mouth at bedtime as needed. (Patient not taking: Reported on 02/02/2024)     ondansetron  (ZOFRAN ) 8 MG tablet Take by mouth every 8 (eight) hours as needed for nausea or vomiting. (Patient not taking: Reported on 02/02/2024)     prochlorperazine  (COMPAZINE ) 5 MG tablet Take 5 mg by mouth every 6 (six) hours as needed for nausea or vomiting. (Patient not taking: Reported on 02/02/2024)     Turmeric (QC TUMERIC COMPLEX PO) Take 1 capsule by mouth at bedtime. (Patient not taking: Reported on 02/02/2024)     No current facility-administered medications for this visit.   Facility-Administered Medications Ordered in Other Visits  Medication Dose Route Frequency Provider Last Rate Last Admin   0.9 %  sodium chloride  infusion   Intravenous Continuous Timmy Forbes, MD   Stopped at 02/02/24 0950   sodium chloride  flush (NS) 0.9 % injection 10 mL  10 mL Intravenous Once Timmy Forbes, MD         PHYSICAL EXAMINATION: ECOG PERFORMANCE STATUS: 1 - Symptomatic but  completely ambulatory Vitals:  02/02/24 0838  BP: (!) 147/75  Pulse: 93  Resp: 18  Temp: 98.1 F (36.7 C)  SpO2: 100%   Filed Weights   02/02/24 0838  Weight: 190 lb 11.2 oz (86.5 kg)    Physical Exam Constitutional:      General: She is not in acute distress.    Appearance: She is not diaphoretic.  HENT:     Head: Normocephalic and atraumatic.     Nose: Nose normal.     Mouth/Throat:     Pharynx: No oropharyngeal exudate.  Eyes:     General: No scleral icterus.    Pupils: Pupils are equal, round, and reactive to light.  Cardiovascular:     Rate and Rhythm: Normal rate and regular rhythm.     Heart sounds: No murmur heard. Pulmonary:     Effort: Pulmonary effort is normal. No respiratory distress.     Comments: Mild decreased breath sound bilaterally.. L>R Abdominal:     General: There is no distension.     Palpations: Abdomen is soft.     Tenderness: There is no abdominal tenderness.  Musculoskeletal:        General: Normal range of motion.     Cervical back: Normal range of motion and neck supple.  Skin:    General: Skin is warm and dry.     Findings: No erythema.  Neurological:     Mental Status: She is alert and oriented to person, place, and time.     Cranial Nerves: No cranial nerve deficit.     Motor: No abnormal muscle tone.     Coordination: Coordination normal.  Psychiatric:        Mood and Affect: Affect normal.       LABORATORY DATA:  I have reviewed the data as listed     Latest Ref Rng & Units 02/02/2024    8:04 AM 01/06/2024   10:27 AM 12/24/2023    8:51 AM  CBC  WBC 4.0 - 10.5 K/uL 5.0  4.7  4.0   Hemoglobin 12.0 - 15.0 g/dL 82.9  56.2  13.0   Hematocrit 36.0 - 46.0 % 31.5  31.3  32.3   Platelets 150 - 400 K/uL 185  176  172       Latest Ref Rng & Units 02/02/2024    8:04 AM 12/24/2023    8:51 AM 11/26/2023    8:50 AM  CMP  Glucose 70 - 99 mg/dL 865  784  696   BUN 8 - 23 mg/dL 20  27  18    Creatinine 0.44 - 1.00 mg/dL 2.95  2.84   1.32   Sodium 135 - 145 mmol/L 140  137  136   Potassium 3.5 - 5.1 mmol/L 3.8  4.0  3.8   Chloride 98 - 111 mmol/L 105  105  103   CO2 22 - 32 mmol/L 24  24  26    Calcium  8.9 - 10.3 mg/dL 9.1  9.2  8.9   Total Protein 6.5 - 8.1 g/dL 6.8  6.7  6.7   Total Bilirubin 0.0 - 1.2 mg/dL 1.0  0.8  1.1   Alkaline Phos 38 - 126 U/L 96  56  52   AST 15 - 41 U/L 121  29  32   ALT 0 - 44 U/L 133  21  25      RADIOGRAPHIC STUDIES: I have personally reviewed the radiological images as listed and agreed with the findings in the report. MR Brain  W Wo Contrast Result Date: 01/27/2024 EXAM: MRI BRAIN WITH AND WITHOUT CONTRAST 01/21/2024 10:07:58 AM TECHNIQUE: Multiplanar multisequence MRI of the head/brain was performed with and without the administration of intravenous contrast. COMPARISON: 07/16/2023 CLINICAL HISTORY: Breast cancer. History of breast cancer with skull metastasis. History of radiation treatment. Evaluation for changes. No symptoms. FINDINGS: INTRACRANIAL STRUCTURES AND VENTRICLES: There is no acute infarct. No mass effect or midline shift. No evidence of an acute intracranial hemorrhage. The ventricles and sulci are normal in size and configuration. The sellar/suprasellar regions appear unremarkable. The normal signal voids within the major intracranial vessels appear maintained. No abnormal focus of enhancement is seen within the brain. Unchanged background of mild chronic small vessel disease. ORBITS: The visualized portion of the orbits demonstrate no acute abnormality. SINUSES: The visualized paranasal sinuses and mastoid air cells demonstrate no acute abnormality. BONES AND SOFT TISSUES: Slightly increased size of the enhancing skull lesion along the right coronal suture now measuring up to 21 mm (axial 131 series 18), previously 17 mm. No new enhancing lesions in the skull. IMPRESSION: 1. Slightly increased size of the enhancing skull lesion along the right coronal suture, now measuring up to 21  mm, previously 17 mm. 2. No new enhancing lesions in the skull. 3. No foci of abnormal enhancement in the brain. Electronically signed by: Audra Blend 01/27/2024 07:49 PM EDT RP Workstation: [id number]   NM Cardiac Muga Rest Result Date: 01/21/2024 CLINICAL DATA:  Breast cancer. Evaluate cardiac function in relation to chemotherapy. EXAM: NUCLEAR MEDICINE CARDIAC BLOOD POOL IMAGING (MUGA) TECHNIQUE: Cardiac multi-gated acquisition was performed at rest following intravenous injection of Tc-31m labeled red blood cells. RADIOPHARMACEUTICALS:  21.7 mCi Tc-80m pertechnetate in-vitro labeled red blood cells IV COMPARISON:  None Available. FINDINGS: No  focal wall motion abnormality of the left ventricle. Calculated left ventricular ejection fraction equals 54.5% IMPRESSION: Left ventricular ejection fraction equals54.5 %. Electronically Signed   By: Deboraha Fallow M.D.   On: 01/21/2024 13:09   US  BIOPSY (LIVER) Result Date: 01/06/2024 INDICATION: 73 year old female referred for biopsy of liver mass EXAM: ULTRASOUND-GUIDED LIVER MASS BIOPSY MEDICATIONS: None. ANESTHESIA/SEDATION: Moderate (conscious) sedation was employed during this procedure. A total of Versed  1.0 mg and Fentanyl  50 mcg was administered intravenously by the radiology nurse. Total intra-service moderate Sedation Time: 13 minutes. The patient's level of consciousness and vital signs were monitored continuously by radiology nursing throughout the procedure under my direct supervision. COMPLICATIONS: None PROCEDURE: Informed written consent was obtained from the patient after a thorough discussion of the procedural risks, benefits and alternatives. All questions were addressed. Maximal Sterile Barrier Technique was utilized including caps, mask, sterile gowns, sterile gloves, sterile drape, hand hygiene and skin antiseptic. A timeout was performed prior to the initiation of the procedure. Ultrasound survey of the right liver lobe performed with  images stored and sent to PACs. The right lower thorax/right upper abdomen was prepped with chlorhexidine  in a sterile fashion, and a sterile drape was applied covering the operative field. A sterile gown and sterile gloves were used for the procedure. Local anesthesia was provided with 1% Lidocaine . The patient was prepped and draped sterilely and the skin and subcutaneous tissues were generously infiltrated with 1% lidocaine . A 17 gauge introducer needle was then advanced under ultrasound guidance in an intercostal location into the right liver lobe, targeting heterogeneously of coag asses the right liver. The stylet was removed, and multiple separate 18 gauge core biopsy were retrieved. Samples were placed into formalin for transportation to the  lab. Gel-Foam slurry then infused with a small amount of saline for assistance with hemostasis. The needle was removed, and a final ultrasound image was performed. The patient tolerated the procedure well and remained hemodynamically stable throughout. No complications were encountered and no significant blood loss was encounter. IMPRESSION: Status post ultrasound-guided biopsy of right liver mass. Signed, Marciano Settles. Rexine Cater, RPVI [facility] Electronically Signed   By: Myrlene Asper D.O.   On: 01/06/2024 12:45   CT CHEST ABDOMEN PELVIS WO CONTRAST Result Date: 12/24/2023 CLINICAL DATA:  Breast cancer restaging. * Tracking Code: BO * EXAM: CT CHEST, ABDOMEN AND PELVIS WITHOUT CONTRAST TECHNIQUE: Multidetector CT imaging of the chest, abdomen and pelvis was performed following the standard protocol without IV contrast. RADIATION DOSE REDUCTION: This exam was performed according to the departmental dose-optimization program which includes automated exposure control, adjustment of the mA and/or kV according to patient size and/or use of iterative reconstruction technique. COMPARISON:  09/16/2023 CT chest,  abdomen and pelvis. FINDINGS: CT CHEST FINDINGS Cardiovascular: Normal heart size. Stable small pericardial effusion. Right internal jugular Port-A-Cath terminates near the cavoatrial junction. Three-vessel coronary atherosclerosis. Atherosclerotic nonaneurysmal thoracic aorta. Normal caliber pulmonary arteries. Mediastinum/Nodes: No significant thyroid nodules. Unremarkable esophagus. Surgical clip again noted in the left axilla. No axillary adenopathy. No pathologically enlarged mediastinal or discrete hilar nodes on these noncontrast images. Lungs/Pleura: No pneumothorax. Small layering right pleural effusion is stable. No significant left pleural effusion. No acute consolidative airspace disease or lung masses. Indistinct 0.6 cm medial right lower lobe nodule on series 4/image 100, new. No additional significant pulmonary nodules. Musculoskeletal: Patchy sclerotic osseous lesions in the manubrium, sternum, right clavicular head, proximal left humerus, inferior left glenoid, thoracic spine and bilateral ribs are all stable. No new focal osseous lesions in the chest. 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 Mild thoracic spondylosis. CT ABDOMEN PELVIS FINDINGS Hepatobiliary: At least 4 scattered hypodense liver masses, all increased or new. Representative segment 4B left liver 2.7 x 1.9 cm mass on series 2/image 53, previously 2.1 x 1.9 cm. Representative segment 5 right liver 2.2 x 1.8 cm mass on series 2/image 54, previously 1.2 x 1.2 cm. Inferior right liver 1.1 x 0.7 cm mass in series 2/image 66, new. Normal gallbladder with no radiopaque cholelithiasis. No biliary ductal dilatation. Pancreas: Normal, with no mass or duct dilation. Spleen: Normal size. No mass. Adrenals/Urinary Tract: Left adrenal 1.6 cm nodule with density 36 HU, stable on numerous CTs back to at least 09/03/2021. No right adrenal nodules. No hydronephrosis. No renal stones. Chronic simple parapelvic bilateral  renal cysts, for which no follow-up imaging is recommended. Normal bladder. Stomach/Bowel: Normal non-distended stomach. Normal caliber small bowel with no small bowel wall thickening. Normal appendix. Moderate left colonic diverticulosis with no large bowel wall thickening or significant pericolonic fat stranding. Vascular/Lymphatic: Atherosclerotic nonaneurysmal abdominal aorta. Hazy central mesenteric fat stranding is not appreciably changed. No pathologically enlarged lymph nodes in the abdomen or pelvis. Reproductive: Grossly normal uterus.  No adnexal mass. Other: No pneumoperitoneum, ascites or focal fluid collection. Musculoskeletal: Widespread patchy sclerotic osseous lesions throughout the lumbar spine, entire sacrum, bilateral iliac wings, right ischium and bilateral proximal femora, not appreciably changed. No appreciable new focal osseous lesions. Moderate lumbar spondylosis. IMPRESSION: 1. 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 2. New indistinct 0.6 cm medial right lower lobe pulmonary nodule, indeterminate for inflammatory nodule versus pulmonary metastasis. Suggest attention on follow-up chest CT in 3  months. 3. Widespread patchy sclerotic osseous metastatic disease, not appreciably changed. 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 4. Stable small layering right pleural effusion. Stable small pericardial effusion. 5. Three-vessel coronary atherosclerosis. 6. Long-term stability of left adrenal nodule favoring an adenoma. 7. Moderate left colonic diverticulosis. 8.  Aortic Atherosclerosis (ICD10-I70.0). Electronically Signed   By: Levell Reach M.D.   On: 12/24/2023 10:49

## 2024-02-02 NOTE — Telephone Encounter (Signed)
 MD is going with Truqap instead, closing this encounter.

## 2024-02-02 NOTE — Assessment & Plan Note (Signed)
 New for her. ? Cancer progression vs other etiologies.  Check hepatitis panel.  Recommend pt to temporarily hold Statin.

## 2024-02-02 NOTE — Patient Instructions (Signed)
 Fulvestrant Injection What is this medication? FULVESTRANT (ful VES trant) treats breast cancer. It works by blocking the hormone estrogen in breast tissue, which prevents breast cancer cells from spreading or growing. This medicine may be used for other purposes; ask your health care provider or pharmacist if you have questions. COMMON BRAND NAME(S): FASLODEX What should I tell my care team before I take this medication? They need to know if you have any of these conditions: Bleeding disorder Liver disease Low blood cell levels (white cells, red cells, and platelets) An unusual or allergic reaction to fulvestrant, other medications, foods, dyes, or preservatives Pregnant or trying to get pregnant Breastfeeding How should I use this medication? This medication is injected into a muscle. It is given by your care team in a hospital or clinic setting. Talk to your care team about the use of this medication in children. Special care may be needed. Overdosage: If you think you have taken too much of this medicine contact a poison control center or emergency room at once. NOTE: This medicine is only for you. Do not share this medicine with others. What if I miss a dose? Keep appointments for follow-up doses. It is important not to miss your dose. Call your care team if you are unable to keep an appointment. What may interact with this medication? Fluoroestradiol F18 This list may not describe all possible interactions. Give your health care provider a list of all the medicines, herbs, non-prescription drugs, or dietary supplements you use. Also tell them if you smoke, drink alcohol, or use illegal drugs. Some items may interact with your medicine. What should I watch for while using this medication? Your condition will be monitored carefully while you are receiving this medication. You may need blood work done while you are taking this medication. This medication is injected into a muscle. Talk  to your care team if you also take medications that prevent or treat blood clots, such as warfarin. Blood thinners may increase the risk of bleeding or bruising in the muscle where this medication is injected. The benefits of this medication may outweigh the risks. Your care team can help you find the option that works for you. They can also help limit the risk of bleeding. Talk to your care team if you may be pregnant. Serious birth defects can occur if you take this medication during pregnancy and for 1 year after the last dose. You will need a negative pregnancy test before starting this medication. Contraception is recommended while taking this medication and for 1 year after the last dose. Your care team can help you find the option that works for you. Do not breastfeed while taking this medication and for 1 year after the last dose. This medication may cause infertility. Talk to your care team if you are concerned about your fertility. What side effects may I notice from receiving this medication? Side effects that you should report to your care team as soon as possible: Allergic reactions or angioedema--skin rash, itching or hives, swelling of the face, eyes, lips, tongue, arms, or legs, trouble swallowing or breathing Pain, tingling, or numbness in the hands or feet Side effects that usually do not require medical attention (report to your care team if they continue or are bothersome): Bone, joint, or muscle pain Constipation Headache Hot flashes Nausea Pain, redness, or irritation at injection site Unusual weakness or fatigue This list may not describe all possible side effects. Call your doctor for medical advice about side  effects. You may report side effects to FDA at 1-800-FDA-1088. Where should I keep my medication? This medication is given in a hospital or clinic. It will not be stored at home. NOTE: This sheet is a summary. It may not cover all possible information. If you have  questions about this medicine, talk to your doctor, pharmacist, or health care provider.  2024 Elsevier/Gold Standard (2023-05-21 00:00:00)  Zoledronic Acid Injection (Cancer) What is this medication? ZOLEDRONIC ACID (ZOE le dron ik AS id) treats high calcium levels in the blood caused by cancer. It may also be used with chemotherapy to treat weakened bones caused by cancer. It works by slowing down the release of calcium from bones. This lowers calcium levels in your blood. It also makes your bones stronger and less likely to break (fracture). It belongs to a group of medications called bisphosphonates. This medicine may be used for other purposes; ask your health care provider or pharmacist if you have questions. COMMON BRAND NAME(S): Zometa, Zometa Powder What should I tell my care team before I take this medication? They need to know if you have any of these conditions: Dehydration Dental disease Kidney disease Liver disease Low levels of calcium in the blood Lung or breathing disease, such as asthma Receiving steroids, such as dexamethasone or prednisone An unusual or allergic reaction to zoledronic acid, other medications, foods, dyes, or preservatives Pregnant or trying to get pregnant Breast-feeding How should I use this medication? This medication is injected into a vein. It is given by your care team in a hospital or clinic setting. Talk to your care team about the use of this medication in children. Special care may be needed. Overdosage: If you think you have taken too much of this medicine contact a poison control center or emergency room at once. NOTE: This medicine is only for you. Do not share this medicine with others. What if I miss a dose? Keep appointments for follow-up doses. It is important not to miss your dose. Call your care team if you are unable to keep an appointment. What may interact with this medication? Certain antibiotics given by injection Diuretics,  such as bumetanide, furosemide NSAIDs, medications for pain and inflammation, such as ibuprofen or naproxen Teriparatide Thalidomide This list may not describe all possible interactions. Give your health care provider a list of all the medicines, herbs, non-prescription drugs, or dietary supplements you use. Also tell them if you smoke, drink alcohol, or use illegal drugs. Some items may interact with your medicine. What should I watch for while using this medication? Visit your care team for regular checks on your progress. It may be some time before you see the benefit from this medication. Some people who take this medication have severe bone, joint, or muscle pain. This medication may also increase your risk for jaw problems or a broken thigh bone. Tell your care team right away if you have severe pain in your jaw, bones, joints, or muscles. Tell you care team if you have any pain that does not go away or that gets worse. Tell your dentist and dental surgeon that you are taking this medication. You should not have major dental surgery while on this medication. See your dentist to have a dental exam and fix any dental problems before starting this medication. Take good care of your teeth while on this medication. Make sure you see your dentist for regular follow-up appointments. You should make sure you get enough calcium and vitamin D while  you are taking this medication. Discuss the foods you eat and the vitamins you take with your care team. Check with your care team if you have severe diarrhea, nausea, and vomiting, or if you sweat a lot. The loss of too much body fluid may make it dangerous for you to take this medication. You may need bloodwork while taking this medication. Talk to your care team if you wish to become pregnant or think you might be pregnant. This medication can cause serious birth defects. What side effects may I notice from receiving this medication? Side effects that you  should report to your care team as soon as possible: Allergic reactions--skin rash, itching, hives, swelling of the face, lips, tongue, or throat Kidney injury--decrease in the amount of urine, swelling of the ankles, hands, or feet Low calcium level--muscle pain or cramps, confusion, tingling, or numbness in the hands or feet Osteonecrosis of the jaw--pain, swelling, or redness in the mouth, numbness of the jaw, poor healing after dental work, unusual discharge from the mouth, visible bones in the mouth Severe bone, joint, or muscle pain Side effects that usually do not require medical attention (report to your care team if they continue or are bothersome): Constipation Fatigue Fever Loss of appetite Nausea Stomach pain This list may not describe all possible side effects. Call your doctor for medical advice about side effects. You may report side effects to FDA at 1-800-FDA-1088. Where should I keep my medication? This medication is given in a hospital or clinic. It will not be stored at home. NOTE: This sheet is a summary. It may not cover all possible information. If you have questions about this medicine, talk to your doctor, pharmacist, or health care provider.  2024 Elsevier/Gold Standard (2021-11-07 00:00:00)

## 2024-02-02 NOTE — Progress Notes (Signed)
 Patient education documented in EPIC note on 02/02/24.

## 2024-02-03 LAB — CANCER ANTIGEN 27.29: CA 27.29: 71.9 U/mL — ABNORMAL HIGH (ref 0.0–38.6)

## 2024-02-03 LAB — CANCER ANTIGEN 15-3: CA 15-3: 56.6 U/mL — ABNORMAL HIGH (ref 0.0–25.0)

## 2024-02-09 ENCOUNTER — Inpatient Hospital Stay

## 2024-02-09 DIAGNOSIS — R7401 Elevation of levels of liver transaminase levels: Secondary | ICD-10-CM

## 2024-02-09 DIAGNOSIS — Z5111 Encounter for antineoplastic chemotherapy: Secondary | ICD-10-CM

## 2024-02-09 DIAGNOSIS — Z95828 Presence of other vascular implants and grafts: Secondary | ICD-10-CM

## 2024-02-09 DIAGNOSIS — C50919 Malignant neoplasm of unspecified site of unspecified female breast: Secondary | ICD-10-CM

## 2024-02-09 LAB — CMP (CANCER CENTER ONLY)
ALT: 81 U/L — ABNORMAL HIGH (ref 0–44)
AST: 62 U/L — ABNORMAL HIGH (ref 15–41)
Albumin: 3.9 g/dL (ref 3.5–5.0)
Alkaline Phosphatase: 95 U/L (ref 38–126)
Anion gap: 9 (ref 5–15)
BUN: 22 mg/dL (ref 8–23)
CO2: 24 mmol/L (ref 22–32)
Calcium: 8.8 mg/dL — ABNORMAL LOW (ref 8.9–10.3)
Chloride: 105 mmol/L (ref 98–111)
Creatinine: 1.14 mg/dL — ABNORMAL HIGH (ref 0.44–1.00)
GFR, Estimated: 51 mL/min — ABNORMAL LOW (ref >60)
Glucose, Bld: 116 mg/dL — ABNORMAL HIGH (ref 70–99)
Potassium: 3.9 mmol/L (ref 3.5–5.1)
Sodium: 138 mmol/L (ref 135–145)
Total Bilirubin: 0.7 mg/dL (ref 0.0–1.2)
Total Protein: 7 g/dL (ref 6.5–8.1)

## 2024-02-09 LAB — HEPATITIS PANEL, ACUTE
HCV Ab: NONREACTIVE
Hep A IgM: NONREACTIVE
Hep B C IgM: NONREACTIVE
Hepatitis B Surface Ag: NONREACTIVE

## 2024-02-09 MED ORDER — SODIUM CHLORIDE 0.9% FLUSH
10.0000 mL | Freq: Once | INTRAVENOUS | Status: AC
  Administered 2024-02-09: 10 mL via INTRAVENOUS
  Filled 2024-02-09: qty 10

## 2024-02-09 MED ORDER — HEPARIN SOD (PORK) LOCK FLUSH 100 UNIT/ML IV SOLN
500.0000 [IU] | Freq: Once | INTRAVENOUS | Status: AC
  Administered 2024-02-09: 500 [IU] via INTRAVENOUS
  Filled 2024-02-09: qty 5

## 2024-02-17 ENCOUNTER — Inpatient Hospital Stay

## 2024-02-17 ENCOUNTER — Ambulatory Visit: Admitting: Oncology

## 2024-02-17 ENCOUNTER — Inpatient Hospital Stay: Admitting: Oncology

## 2024-02-17 ENCOUNTER — Other Ambulatory Visit

## 2024-02-17 ENCOUNTER — Encounter: Payer: Self-pay | Admitting: Oncology

## 2024-02-17 ENCOUNTER — Ambulatory Visit

## 2024-02-17 VITALS — BP 150/68 | HR 76 | Temp 97.1°F | Resp 18 | Ht 64.0 in | Wt 188.7 lb

## 2024-02-17 DIAGNOSIS — Z5111 Encounter for antineoplastic chemotherapy: Secondary | ICD-10-CM | POA: Diagnosis not present

## 2024-02-17 DIAGNOSIS — R7401 Elevation of levels of liver transaminase levels: Secondary | ICD-10-CM

## 2024-02-17 DIAGNOSIS — C50919 Malignant neoplasm of unspecified site of unspecified female breast: Secondary | ICD-10-CM

## 2024-02-17 DIAGNOSIS — C7951 Secondary malignant neoplasm of bone: Secondary | ICD-10-CM | POA: Diagnosis not present

## 2024-02-17 DIAGNOSIS — K521 Toxic gastroenteritis and colitis: Secondary | ICD-10-CM

## 2024-02-17 DIAGNOSIS — G620 Drug-induced polyneuropathy: Secondary | ICD-10-CM | POA: Diagnosis not present

## 2024-02-17 DIAGNOSIS — T451X5A Adverse effect of antineoplastic and immunosuppressive drugs, initial encounter: Secondary | ICD-10-CM

## 2024-02-17 LAB — CMP (CANCER CENTER ONLY)
ALT: 41 U/L (ref 0–44)
AST: 42 U/L — ABNORMAL HIGH (ref 15–41)
Albumin: 3.9 g/dL (ref 3.5–5.0)
Alkaline Phosphatase: 88 U/L (ref 38–126)
Anion gap: 8 (ref 5–15)
BUN: 25 mg/dL — ABNORMAL HIGH (ref 8–23)
CO2: 24 mmol/L (ref 22–32)
Calcium: 8.8 mg/dL — ABNORMAL LOW (ref 8.9–10.3)
Chloride: 105 mmol/L (ref 98–111)
Creatinine: 1.11 mg/dL — ABNORMAL HIGH (ref 0.44–1.00)
GFR, Estimated: 52 mL/min — ABNORMAL LOW (ref >60)
Glucose, Bld: 137 mg/dL — ABNORMAL HIGH (ref 70–99)
Potassium: 4 mmol/L (ref 3.5–5.1)
Sodium: 137 mmol/L (ref 135–145)
Total Bilirubin: 0.8 mg/dL (ref 0.0–1.2)
Total Protein: 7.1 g/dL (ref 6.5–8.1)

## 2024-02-17 LAB — CBC WITH DIFFERENTIAL (CANCER CENTER ONLY)
Abs Immature Granulocytes: 0.14 10*3/uL — ABNORMAL HIGH (ref 0.00–0.07)
Basophils Absolute: 0.1 10*3/uL (ref 0.0–0.1)
Basophils Relative: 1 %
Eosinophils Absolute: 0.3 10*3/uL (ref 0.0–0.5)
Eosinophils Relative: 4 %
HCT: 34.7 % — ABNORMAL LOW (ref 36.0–46.0)
Hemoglobin: 11.5 g/dL — ABNORMAL LOW (ref 12.0–15.0)
Immature Granulocytes: 2 %
Lymphocytes Relative: 25 %
Lymphs Abs: 1.8 10*3/uL (ref 0.7–4.0)
MCH: 32 pg (ref 26.0–34.0)
MCHC: 33.1 g/dL (ref 30.0–36.0)
MCV: 96.7 fL (ref 80.0–100.0)
Monocytes Absolute: 0.7 10*3/uL (ref 0.1–1.0)
Monocytes Relative: 10 %
Neutro Abs: 4.2 10*3/uL (ref 1.7–7.7)
Neutrophils Relative %: 58 %
Platelet Count: 239 10*3/uL (ref 150–400)
RBC: 3.59 MIL/uL — ABNORMAL LOW (ref 3.87–5.11)
RDW: 13.5 % (ref 11.5–15.5)
WBC Count: 7.2 10*3/uL (ref 4.0–10.5)
nRBC: 0 % (ref 0.0–0.2)

## 2024-02-17 MED ORDER — GABAPENTIN 300 MG PO CAPS: ORAL_CAPSULE | ORAL | 0 refills | Status: DC

## 2024-02-17 MED ORDER — FULVESTRANT 250 MG/5ML IM SOSY
500.0000 mg | PREFILLED_SYRINGE | Freq: Once | INTRAMUSCULAR | Status: AC
  Administered 2024-02-17: 500 mg via INTRAMUSCULAR
  Filled 2024-02-17: qty 10

## 2024-02-17 MED ORDER — HEPARIN SOD (PORK) LOCK FLUSH 100 UNIT/ML IV SOLN
500.0000 [IU] | Freq: Once | INTRAVENOUS | Status: AC
  Administered 2024-02-17: 500 [IU] via INTRAVENOUS
  Filled 2024-02-17: qty 5

## 2024-02-17 NOTE — Progress Notes (Signed)
 Refill gabapentin , pended.

## 2024-02-17 NOTE — Assessment & Plan Note (Signed)
#  Grade 2 neuropathy, continue gabapentin,  Continue gabapentin 600 mg in a.m., she lower the night time dose to 900 mg  Declined acupuncture

## 2024-02-17 NOTE — Assessment & Plan Note (Signed)
 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 Continue calcium  and vitamin D supplementation. 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

## 2024-02-17 NOTE — Assessment & Plan Note (Signed)
 Chemotherapy plan as listed above

## 2024-02-17 NOTE — Assessment & Plan Note (Signed)
 Improving.  temporarily hold Statin.

## 2024-02-17 NOTE — Progress Notes (Signed)
 Hematology/Oncology Progress note Telephone:([phone]) [phone] Fax:(336) [phone]     CHIEF COMPLAINTS/REASON FOR VISIT:  metastatic breast caner ASSESSMENT & PLAN:   Cancer Staging  Primary malignant neoplasm of breast with metastasis (HCC) Staging form: Breast, AJCC 8th Edition - Clinical stage from 07/12/2021: Stage IV (rcTX, cNX, pM1, ER+, PR+, HER2-) - Signed by Timmy Forbes, MD on 03/08/2022   Primary malignant neoplasm of breast with metastasis (HCC) 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  Labs are reviewed and discussed with patient. Continue fulvestrant  and Capivasertib  [400mg  Q12h on D1-4 of each week].  Stop letrozole , she will get fulvestrant  today.  She tolerates well.  Check fasting glucose on D3 or 4 of weeks 1, 2, 4, 6, 8.     Chemotherapy induced diarrhea At baseline, not worse. Imodium PRN  Chemotherapy-induced neuropathy (HCC) #Grade 2 neuropathy, continue gabapentin ,  Continue gabapentin  600 mg in a.m., she lower the night time dose to 900 mg  Declined acupuncture    Encounter for antineoplastic chemotherapy Chemotherapy plan as listed above   Metastasis to bone [facility]) 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 Continue calcium  and vitamin D supplementation. Aaron Aas  MRI brain showed enlarging right frontal calvarium - s/p RT  Zometa  Q1-2 months.   Transaminitis Improving.  temporarily hold Statin.        Orders Placed This Encounter  Procedures   CBC with Differential  (Cancer Center Only)    Standing Status:   Future    Expected Date:   03/30/2024    Expiration Date:   02/16/2025   CMP (Cancer Center only)    Standing Status:   Future    Expected Date:   03/30/2024    Expiration Date:   02/16/2025   Basic Metabolic Panel - Cancer Center Only    Standing Status:   Future    Expected Date:   03/16/2024    Expiration Date:   02/16/2025   CBC with Differential (Cancer Center Only)    Standing Status:   Future    Expected Date:   03/02/2024    Expiration Date:   02/16/2025   CMP (Cancer Center only)    Standing Status:   Future    Expected Date:   03/02/2024    Expiration Date:   02/16/2025    Follow up 2 weeks lab MD  All questions were answered. The patient knows to call the clinic with any problems, questions or concerns.  Timmy Forbes, MD, PhD [facility] Hematology Oncology 02/17/2024      HISTORY OF PRESENTING ILLNESS:   Amber Blevins is a  73 y.o.  female with PMH listed below presents for follow up for treatment of metastatic breast cancer.   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 Additional metastatic involvement T12, L1 through L3.  No primary tumor site identified. Top differential considerations are Metastatic Disease Unknown primary, less likely Lymphoma or Multiple Myeloma.   2. Superimposed lumbar spine degeneration, including  degenerative moderate to severe left L3 and L4 nerve level impingement from disc herniation.      07/02/2021 Imaging   CT chest abdomen pelvis showed innumerable small pulmonary and pleural nodules consistent with diffuse metastasis.  Associated with probable malignant pleural effusion.  Mediastinal and hilar lymphadenopathy consistent with metastatic disease.  Hepatic and bilateral adrenal gland metastasis.  Diffuse extensive destructive metastatic bone disease involving pelvis.   07/12/2021 Cancer Staging   Staging form: Breast, AJCC 8th Edition - Clinical stage from 07/12/2021: Stage IV (rcTX, cNX, pM1, ER+, PR+, HER2-) - Signed by Timmy Forbes, MD on 03/08/2022 Stage prefix: Recurrence   07/17/2021 Initial Diagnosis   Metastatic breast cancer -history of breast cancer, diagnosed in 2010 Left-sided T2 (2.4cm) N0, ER/PR positive, HER2 negative IDC of the breast, s/p lumpectomy by Dr.Meyer at [facility]. Oncotype score of 11 who completed radiation and she finished 10 years of Femara  from 05/2009 and stopped in 2020.  -07/12/2021 patient underwent right thoracentesis.  Cytology was positive for metastatic carcinoma, compatible with breast origin.  ER/PR +, HER2 negative -07/16/2021, patient underwent iliac bone biopsy.  Positive for metastatic carcinoma.   07/21/2021 - 09/16/2021 Chemotherapy   Patient is on Treatment Plan : BREAST Paclitaxel  D1,8,15 q28d     09/04/2021 Imaging   CT chest abdomen pelvis showed improvement of the pulmonary nodularity and thickening in the left and the right lung.  Persistent nodular interstitial and pleural thickening remains.  Improvement of mediastinal and hilar adenopathy, hepatic metastatic lesions are similar.  Adrenal gland metastasis is similar.  Interval increase in the sclerotic bone metastasis.    09/30/2021 -  Chemotherapy   started on abemaciclib  100 mg twice daily and letrozole .   02/24/2022 Imaging    CT chest abdomen pelvis with contrast showed unchanged  bilateral pleural effusion with associated intralobular septal thickening of the lung bases.  Without discrete nodularity consistent with stable appearance of treated pleural and lymphangitic metastatic disease.  Stable or minimally diminished subcentimeter liver lesions.  Unchanged left adrenal metastasis.  Unchanged widespread sclerotic osseous metastatic disease involving the included axial and appendicular skeleton.  No evidence of new metastatic disease in the chest abdomen or pelvis.  Coronary artery disease.   03/06/2022 Procedure   Ultrasound guided thoracentesis, removal of pleural fluid.    05/29/2022 Imaging   CT chest abdomen pelvis 1. Stable CT of the chest, abdomen and pelvis. 2. Unchanged appearance of bilateral pleural effusions, pleural nodularity, and interlobular septal thickening compatible with pleural and lymphangitic metastatic disease. 3. Stable appearance of multifocal sclerotic bone metastases. 4. Multifocal low-attenuation liver lesions are unchanged in the interval. 5. Stable appearance of left adrenal gland metastases. 6. Stable appearance of small pericardial effusion.7. No new or progressive disease identified.8. Aortic Atherosclerosis (ICD10-I70.0).   06/06/2022 Imaging   Bone scan Constellation of findings are consistent with multifocal osseous metastatic disease throughout the axial and appendicular skeleton.Of note, osseous metastatic disease involves the RIGHT greater than LEFT femur. Consider evaluation with dedicated hip and femur radiographs to evaluate for overall  disease burden and potential risk for pathologic fracture.   08/31/2022 Imaging   CT chest abdomen pelvis w contrast 1. Unchanged small right, trace left pleural effusions with associated diffuse interlobular septal thickening and fine fissural and perilymphatic nodularity throughout the lungs, findings consistent with lymphangitic metastatic disease. 2. Stable or slightly diminished very subtle  hypodense lesion of the central right lobe of the liver, hepatic segment VIII measuring 0.6 cm, previously 0.8 cm. Additional unchanged, very subtle hypodense lesion of hepatic segment IV B measuring 1.1 cm. 3. Unchanged widespread sclerotic osseous metastatic disease throughout the included axial and proximal appendicular skeleton, particularly dense in the lower lumbar spine and sacrum. 4. Unchanged left adrenal nodule measuring 1.7 x 1.1 cm. 5. Overall constellation of findings is consistent with stable metastatic disease. 6. Coronary artery disease.    12/01/2022 Miscellaneous   [facility] liquid biopsy - PIK3CA mutation. 0.1% Not enough tissue for tissue NGS   03/08/2023 Imaging   Bone scan showed Multifocal osseous metastatic disease. One new focus along the right side of the skull   08/23/2023 - 09/06/2023 Radiation Therapy   Skull radiation.    12/24/2023 Imaging   CT chest abdomen pelvis wo contrast showed 1. 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 2. New indistinct 0.6 cm medial right lower lobe pulmonary nodule, indeterminate for inflammatory nodule versus pulmonary metastasis. Suggest attention on follow-up chest CT in 3 months. 3. Widespread patchy sclerotic osseous metastatic disease, not appreciably changed. 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 4. Stable small layering right pleural effusion. Stable small pericardial effusion. 5. Three-vessel coronary atherosclerosis. 6. Long-term stability of left adrenal nodule favoring an adenoma. 7. Moderate left colonic diverticulosis. 8.  Aortic Atherosclerosis (ICD10-I70.0).    02/02/2024 - 02/02/2024 Chemotherapy   Patient is on Treatment Plan : BREAST Fam-Trastuzumab Deruxtecan-nxki (Enhertu) (5.4) q21d      Procedure   Liver biopsy Pathology showed metasatic adenocarcinoma Immunohistochemical stains show that the tumor cells are positive for CK7 and GATA3 while they are  negative for CK20 and CDX2, consistent with patient's clinical history of primary breast carcinoma.     ER 95%, PR 0%, HER2 low (1+), Ki 67 5% NGS showed PIK3CA mutation, CHEK mutation.    02/07/2024 -  Chemotherapy   Started on Capivasertib  400mg  Q12h on D1-4 of each week   Metastasis to bone (HCC)  02/04/2022 Initial Diagnosis   Metastasis to bone (HCC)   02/02/2024 - 02/02/2024 Chemotherapy   Patient is on Treatment Plan : BREAST Fam-Trastuzumab Deruxtecan-nxki (Enhertu) (5.4) q21d      Patient follows up with cardiology for evaluation of bradycardia.  Cardiology notes reviewed.  72-hour Holter monitor revealed revealed predominant sinus rhythm with mean heart rate of 96 bpm, heart rate range 53 to 129 bpm, frequent premature ventricular contractions (27% burden), and intermittent atrial fibrillation (5% burden) with longest episode 3 hours. CHA2DS2-VASc score of 3, per cardiology recommendation patient was started on Eliquis 5 mg twice daily.     INTERVAL HISTORY Amber Blevins is a 73 y.o. female who has above history reviewed by me today presents for follow up visit for metastatic breast cancer Occasional back and hip pain..   No Fever or chills. Denies abdominal pain,nausea or vomiting.  Started on Capivasertib  400mg  Q12h on D1-4 of each week. So far she tolerates well. No significant side effects.     .  Review of Systems  Constitutional:  Negative for appetite change, chills, fatigue and fever.  HENT:  Negative for hearing loss and voice change.        Nasal congestion  Eyes:  Negative for eye problems.  Respiratory:  Positive for cough. Negative for chest tightness and shortness of breath.   Cardiovascular:  Negative for chest pain.  Gastrointestinal:  Negative for abdominal distention, abdominal pain and blood in stool.  Endocrine: Negative for hot flashes.  Genitourinary:  Negative for difficulty urinating and frequency.   Musculoskeletal:  Negative for arthralgias.   Skin:  Negative for itching and rash.  Neurological:  Positive for numbness. Negative for extremity weakness.  Hematological:  Negative for adenopathy.  Psychiatric/Behavioral:  Negative for confusion.      MEDICAL HISTORY:  Past Medical History:  Diagnosis Date   A-fib (HCC)    Breast cancer (HCC)    COPD (chronic obstructive pulmonary disease) (HCC)    Family history of adverse reaction to anesthesia    brother has problem with coming out of anesthesia   High cholesterol    Hypertension    Personal history of radiation therapy    Pre-diabetes     SURGICAL HISTORY: Past Surgical History:  Procedure Laterality Date   BREAST LUMPECTOMY Left    2010   COLONOSCOPY     EYE SURGERY Left    Cataract surgery   PORTACATH PLACEMENT N/A 08/01/2021   Procedure: INSERTION PORT-A-CATH;  Surgeon: Eldred Grego, MD;  Location: [facility] ORS;  Service: General;  Laterality: N/A;   thorocentesis Right 07/10/2021   and another on 07/25/21    SOCIAL HISTORY: Social History   Socioeconomic History   Marital status: Married    Spouse name: Not on file   Number of children: Not on file   Years of education: Not on file   Highest education level: Not on file  Occupational History   Not on file  Tobacco Use   Smoking status: Former    Current packs/day: 0.00    Average packs/day: 1 pack/day for 25.0 years (25.0 ttl pk-yrs)    Types: Cigarettes    Start date: 67    Quit date: 40    Years since quitting: 35.4   Smokeless tobacco: Never  Vaping Use   Vaping status: Never Used  Substance and Sexual Activity   Alcohol use: Not Currently   Drug use: Never   Sexual activity: Not on file  Other Topics Concern   Not on file  Social History Narrative   Not on file   Social Drivers of Health   Financial Resource Strain: Low Risk  (07/26/2023)   Received from [facility]   Overall Financial Resource Strain (CARDIA)    Difficulty of Paying Living Expenses:  Not hard at all  Food Insecurity: No Food Insecurity (07/26/2023)   [facility] from [facility]   Hunger Vital Sign    Worried About Running Out of Food in the Last Year: Never true    Ran Out of Food in the Last Year: Never true  Transportation Needs: No Transportation Needs (07/26/2023)   Received from [facility] - Transportation    In the past 12 months, has lack of transportation kept you from medical appointments or from getting medications?: No    Lack of Transportation (Non-Medical): No  Physical Activity: Not on file  Stress: Not on file  Social Connections: Not on file  Intimate Partner Violence: Not on file    FAMILY HISTORY: Family History  Problem Relation Age of Onset  Cancer Mother        gynecological   Lung cancer Mother    Diabetes Father    Heart disease Father    Parkinson's disease Father    Brain cancer Brother    Bladder Cancer Brother    Pulmonary disease Brother    Rheumatic fever Brother    Breast cancer Neg Hx     ALLERGIES:  is allergic to green tea (camellia sinensis), melaleuca viridiflora, tea tree oil, and lisinopril.  MEDICATIONS:  Current Outpatient Medications  Medication Sig Dispense Refill   acetaminophen  (TYLENOL ) 650 MG CR tablet Take 650 mg by mouth every 8 (eight) hours as needed for pain.     Boswellia-Glucosamine-Vit D (OSTEO BI-FLEX-GLUCOS/5-LOXIN) TABS Take 1 capsule by mouth in the morning and at bedtime.     Calcium  Carb-Cholecalciferol 600-400 MG-UNIT TABS Take 1 tablet by mouth 2 (two) times daily.     capivasertib  (TRUQAP ) 200 MG tablet Take 2 tablets (400 mg total) by mouth 2 (two) times daily. Take for 4 days, then hold for 3 days. Repeat every 7 days. 64 tablet 0   cetirizine (ZYRTEC) 10 MG chewable tablet Chew 10 mg by mouth daily as needed for allergies.     cyanocobalamin 1000 MCG tablet Take 1,000 mcg by mouth daily.     diphenoxylate -atropine  (LOMOTIL ) 2.5-0.025 MG tablet  Take 1 tablet by mouth 4 (four) times daily as needed for diarrhea or loose stools. 30 tablet 0   ELIQUIS 5 MG TABS tablet Take 5 mg by mouth 2 (two) times daily.     Iron-Vitamin C (VITRON-C) 65-125 MG TABS Take 1 tablet by mouth daily.     lidocaine -prilocaine  (EMLA ) cream Apply small amount to port and cover with saran wrap 1-2 hours prior to port access 30 g 5   Loperamide HCl (IMODIUM PO) Take by mouth.     losartan (COZAAR) 50 MG tablet Take 50 mg by mouth daily.     magnesium chloride (SLOW-MAG) 64 MG TBEC SR tablet Take 1 tablet by mouth at bedtime.     Turmeric (QC TUMERIC COMPLEX PO) Take 1 capsule by mouth at bedtime.     atorvastatin  (LIPITOR) 40 MG tablet Take 40 mg by mouth at bedtime. (Patient not taking: Reported on 02/02/2024)     Docusate Sodium (DSS) 100 MG CAPS Take 1 capsule by mouth daily. (Patient not taking: Reported on 02/17/2024)     gabapentin  (NEURONTIN ) 300 MG capsule TAKE 2 CAPSULES BY MOUTH IN THE MORNING AND 3 CAPSULES IN THE EVENING 450 capsule 0   melatonin (MELATONIN MAXIMUM STRENGTH) 5 MG TABS Take 1 tablet by mouth at bedtime as needed. (Patient not taking: Reported on 01/13/2024)     ondansetron  (ZOFRAN ) 8 MG tablet Take 1 tablet (8 mg total) by mouth every 8 (eight) hours as needed for nausea or vomiting. (Patient not taking: Reported on 02/17/2024) 20 tablet 1   prochlorperazine  (COMPAZINE ) 5 MG tablet Take 5 mg by mouth every 6 (six) hours as needed for nausea or vomiting. (Patient not taking: Reported on 12/01/2022)     No current facility-administered medications for this visit.   Facility-Administered Medications Ordered in Other Visits  Medication Dose Route Frequency Provider Last Rate Last Admin   sodium chloride  flush (NS) 0.9 % injection 10 mL  10 mL Intravenous Once Timmy Forbes, MD         PHYSICAL EXAMINATION: ECOG PERFORMANCE STATUS: 1 - Symptomatic but completely ambulatory Vitals:   02/17/24 5956 02/17/24 0910  BP: (!) 140/80 (!) 150/68  Pulse:  76   Resp: 18   Temp: (!) 97.1 F (36.2 C)   SpO2: 98%    Filed Weights   02/17/24 0858  Weight: 188 lb 11.2 oz (85.6 kg)    Physical Exam Constitutional:      General: She is not in acute distress.    Appearance: She is not diaphoretic.  HENT:     Head: Normocephalic and atraumatic.     Nose: Nose normal.     Mouth/Throat:     Pharynx: No oropharyngeal exudate.  Eyes:     General: No scleral icterus.    Pupils: Pupils are equal, round, and reactive to light.  Cardiovascular:     Rate and Rhythm: Normal rate and regular rhythm.     Heart sounds: No murmur heard. Pulmonary:     Effort: Pulmonary effort is normal. No respiratory distress.     Comments: Mild decreased breath sound bilaterally.. L>R Abdominal:     General: There is no distension.     Palpations: Abdomen is soft.     Tenderness: There is no abdominal tenderness.  Musculoskeletal:        General: Normal range of motion.     Cervical back: Normal range of motion and neck supple.  Skin:    General: Skin is warm and dry.     Findings: No erythema.  Neurological:     Mental Status: She is alert and oriented to person, place, and time.     Cranial Nerves: No cranial nerve deficit.     Motor: No abnormal muscle tone.     Coordination: Coordination normal.  Psychiatric:        Mood and Affect: Affect normal.       LABORATORY DATA:  I have reviewed the data as listed     Latest Ref Rng & Units 02/17/2024    8:19 AM 02/02/2024    8:04 AM 01/06/2024   10:27 AM  CBC  WBC 4.0 - 10.5 K/uL 7.2  5.0  4.7   Hemoglobin 12.0 - 15.0 g/dL 16.1  09.6  04.5   Hematocrit 36.0 - 46.0 % 34.7  31.5  31.3   Platelets 150 - 400 K/uL 239  185  176       Latest Ref Rng & Units 02/17/2024    8:19 AM 02/09/2024    7:57 AM 02/02/2024    8:04 AM  CMP  Glucose 70 - 99 mg/dL 409  811  914   BUN 8 - 23 mg/dL 25  22  20    Creatinine 0.44 - 1.00 mg/dL 7.82  9.56  2.13   Sodium 135 - 145 mmol/L 137  138  140   Potassium 3.5 -  5.1 mmol/L 4.0  3.9  3.8   Chloride 98 - 111 mmol/L 105  105  105   CO2 22 - 32 mmol/L 24  24  24    Calcium  8.9 - 10.3 mg/dL 8.8  8.8  9.1   Total Protein 6.5 - 8.1 g/dL 7.1  7.0  6.8   Total Bilirubin 0.0 - 1.2 mg/dL 0.8  0.7  1.0   Alkaline Phos 38 - 126 U/L 88  95  96   AST 15 - 41 U/L 42  62  121   ALT 0 - 44 U/L 41  81  133      RADIOGRAPHIC STUDIES: I have personally reviewed the radiological images as listed and agreed with the findings  in the report. MR Brain W Wo Contrast Result Date: 01/27/2024 EXAM: MRI BRAIN WITH AND WITHOUT CONTRAST 01/21/2024 10:07:58 AM TECHNIQUE: Multiplanar multisequence MRI of the head/brain was performed with and without the administration of intravenous contrast. COMPARISON: 07/16/2023 CLINICAL HISTORY: Breast cancer. History of breast cancer with skull metastasis. History of radiation treatment. Evaluation for changes. No symptoms. FINDINGS: INTRACRANIAL STRUCTURES AND VENTRICLES: There is no acute infarct. No mass effect or midline shift. No evidence of an acute intracranial hemorrhage. The ventricles and sulci are normal in size and configuration. The sellar/suprasellar regions appear unremarkable. The normal signal voids within the major intracranial vessels appear maintained. No abnormal focus of enhancement is seen within the brain. Unchanged background of mild chronic small vessel disease. ORBITS: The visualized portion of the orbits demonstrate no acute abnormality. SINUSES: The visualized paranasal sinuses and mastoid air cells demonstrate no acute abnormality. BONES AND SOFT TISSUES: Slightly increased size of the enhancing skull lesion along the right coronal suture now measuring up to 21 mm (axial 131 series 18), previously 17 mm. No new enhancing lesions in the skull. IMPRESSION: 1. Slightly increased size of the enhancing skull lesion along the right coronal suture, now measuring up to 21 mm, previously 17 mm. 2. No new enhancing lesions in the skull.  3. No foci of abnormal enhancement in the brain. Electronically signed by: Audra Blend 01/27/2024 07:49 PM EDT RP Workstation: [id number]   NM Cardiac Muga Rest Result Date: 01/21/2024 CLINICAL DATA:  Breast cancer. Evaluate cardiac function in relation to chemotherapy. EXAM: NUCLEAR MEDICINE CARDIAC BLOOD POOL IMAGING (MUGA) TECHNIQUE: Cardiac multi-gated acquisition was performed at rest following intravenous injection of Tc-37m labeled red blood cells. RADIOPHARMACEUTICALS:  21.7 mCi Tc-78m pertechnetate in-vitro labeled red blood cells IV COMPARISON:  None Available. FINDINGS: No  focal wall motion abnormality of the left ventricle. Calculated left ventricular ejection fraction equals 54.5% IMPRESSION: Left ventricular ejection fraction equals54.5 %. Electronically Signed   By: Deboraha Fallow M.D.   On: 01/21/2024 13:09   US  BIOPSY (LIVER) Result Date: 01/06/2024 INDICATION: 73 year old female referred for biopsy of liver mass EXAM: ULTRASOUND-GUIDED LIVER MASS BIOPSY MEDICATIONS: None. ANESTHESIA/SEDATION: Moderate (conscious) sedation was employed during this procedure. A total of Versed  1.0 mg and Fentanyl  50 mcg was administered intravenously by the radiology nurse. Total intra-service moderate Sedation Time: 13 minutes. The patient's level of consciousness and vital signs were monitored continuously by radiology nursing throughout the procedure under my direct supervision. COMPLICATIONS: None PROCEDURE: Informed written consent was obtained from the patient after a thorough discussion of the procedural risks, benefits and alternatives. All questions were addressed. Maximal Sterile Barrier Technique was utilized including caps, mask, sterile gowns, sterile gloves, sterile drape, hand hygiene and skin antiseptic. A timeout was performed prior to the initiation of the procedure. Ultrasound survey of the right liver lobe performed with images stored and sent to PACs. The right lower thorax/right  upper abdomen was prepped with chlorhexidine  in a sterile fashion, and a sterile drape was applied covering the operative field. A sterile gown and sterile gloves were used for the procedure. Local anesthesia was provided with 1% Lidocaine . The patient was prepped and draped sterilely and the skin and subcutaneous tissues were generously infiltrated with 1% lidocaine . A 17 gauge introducer needle was then advanced under ultrasound guidance in an intercostal location into the right liver lobe, targeting heterogeneously of coag asses the right liver. The stylet was removed, and multiple separate 18 gauge core biopsy were retrieved. Samples were placed into  formalin for transportation to the lab. Gel-Foam slurry then infused with a small amount of saline for assistance with hemostasis. The needle was removed, and a final ultrasound image was performed. The patient tolerated the procedure well and remained hemodynamically stable throughout. No complications were encountered and no significant blood loss was encounter. IMPRESSION: Status post ultrasound-guided biopsy of right liver mass. Signed, Marciano Settles. Rexine Cater, RPVI [facility] Electronically Signed   By: Myrlene Asper D.O.   On: 01/06/2024 12:45   CT CHEST ABDOMEN PELVIS WO CONTRAST Result Date: 12/24/2023 CLINICAL DATA:  Breast cancer restaging. * Tracking Code: BO * EXAM: CT CHEST, ABDOMEN AND PELVIS WITHOUT CONTRAST TECHNIQUE: Multidetector CT imaging of the chest, abdomen and pelvis was performed following the standard protocol without IV contrast. RADIATION DOSE REDUCTION: This exam was performed according to the departmental dose-optimization program which includes automated exposure control, adjustment of the mA and/or kV according to patient size and/or use of iterative reconstruction technique. COMPARISON:  09/16/2023 CT chest, abdomen and pelvis. FINDINGS: CT CHEST FINDINGS  Cardiovascular: Normal heart size. Stable small pericardial effusion. Right internal jugular Port-A-Cath terminates near the cavoatrial junction. Three-vessel coronary atherosclerosis. Atherosclerotic nonaneurysmal thoracic aorta. Normal caliber pulmonary arteries. Mediastinum/Nodes: No significant thyroid nodules. Unremarkable esophagus. Surgical clip again noted in the left axilla. No axillary adenopathy. No pathologically enlarged mediastinal or discrete hilar nodes on these noncontrast images. Lungs/Pleura: No pneumothorax. Small layering right pleural effusion is stable. No significant left pleural effusion. No acute consolidative airspace disease or lung masses. Indistinct 0.6 cm medial right lower lobe nodule on series 4/image 100, new. No additional significant pulmonary nodules. Musculoskeletal: Patchy sclerotic osseous lesions in the manubrium, sternum, right clavicular head, proximal left humerus, inferior left glenoid, thoracic spine and bilateral ribs are all stable. No new focal osseous lesions in the chest. 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 Mild thoracic spondylosis. CT ABDOMEN PELVIS FINDINGS Hepatobiliary: At least 4 scattered hypodense liver masses, all increased or new. Representative segment 4B left liver 2.7 x 1.9 cm mass on series 2/image 53, previously 2.1 x 1.9 cm. Representative segment 5 right liver 2.2 x 1.8 cm mass on series 2/image 54, previously 1.2 x 1.2 cm. Inferior right liver 1.1 x 0.7 cm mass in series 2/image 66, new. Normal gallbladder with no radiopaque cholelithiasis. No biliary ductal dilatation. Pancreas: Normal, with no mass or duct dilation. Spleen: Normal size. No mass. Adrenals/Urinary Tract: Left adrenal 1.6 cm nodule with density 36 HU, stable on numerous CTs back to at least 09/03/2021. No right adrenal nodules. No hydronephrosis. No renal stones. Chronic simple parapelvic bilateral renal cysts, for which no follow-up imaging is  recommended. Normal bladder. Stomach/Bowel: Normal non-distended stomach. Normal caliber small bowel with no small bowel wall thickening. Normal appendix. Moderate left colonic diverticulosis with no large bowel wall thickening or significant pericolonic fat stranding. Vascular/Lymphatic: Atherosclerotic nonaneurysmal abdominal aorta. Hazy central mesenteric fat stranding is not appreciably changed. No pathologically enlarged lymph nodes in the abdomen or pelvis. Reproductive: Grossly normal uterus.  No adnexal mass. Other: No pneumoperitoneum, ascites or focal fluid collection. Musculoskeletal: Widespread patchy sclerotic osseous lesions throughout the lumbar spine, entire sacrum, bilateral iliac wings, right ischium and bilateral proximal femora, not appreciably changed. No appreciable new focal osseous lesions. Moderate lumbar spondylosis. IMPRESSION: 1. 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 2. New indistinct 0.6 cm medial right lower lobe pulmonary nodule, indeterminate for inflammatory nodule versus pulmonary metastasis. Suggest attention on  follow-up chest CT in 3 months. 3. Widespread patchy sclerotic osseous metastatic disease, not appreciably changed. 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 4. Stable small layering right pleural effusion. Stable small pericardial effusion. 5. Three-vessel coronary atherosclerosis. 6. Long-term stability of left adrenal nodule favoring an adenoma. 7. Moderate left colonic diverticulosis. 8.  Aortic Atherosclerosis (ICD10-I70.0). Electronically Signed   By: Levell Reach M.D.   On: 12/24/2023 10:49

## 2024-02-17 NOTE — Assessment & Plan Note (Addendum)
 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  Labs are reviewed and discussed with patient. Continue fulvestrant  and Capivasertib  [400mg  Q12h on D1-4 of each week].  Stop letrozole , she will get fulvestrant  today.  She tolerates well.  Check fasting glucose on D3 or 4 of weeks 1, 2, 4, 6, 8.

## 2024-02-17 NOTE — Assessment & Plan Note (Signed)
 At baseline, not worse. Imodium PRN

## 2024-02-18 ENCOUNTER — Ambulatory Visit: Admitting: Oncology

## 2024-02-18 ENCOUNTER — Other Ambulatory Visit

## 2024-02-18 ENCOUNTER — Ambulatory Visit

## 2024-02-23 ENCOUNTER — Other Ambulatory Visit: Payer: Self-pay

## 2024-02-24 ENCOUNTER — Other Ambulatory Visit: Payer: Self-pay

## 2024-02-24 ENCOUNTER — Other Ambulatory Visit (HOSPITAL_COMMUNITY): Payer: Self-pay

## 2024-02-24 ENCOUNTER — Other Ambulatory Visit: Payer: Self-pay | Admitting: Oncology

## 2024-02-24 DIAGNOSIS — C50919 Malignant neoplasm of unspecified site of unspecified female breast: Secondary | ICD-10-CM

## 2024-02-24 NOTE — Progress Notes (Signed)
 Specialty Pharmacy Ongoing Clinical Assessment Note  Amber Blevins is a 73 y.o. female who is being followed by the specialty pharmacy service for RxSp Oncology   Patient's specialty medication(s) reviewed today: Capivasertib  (TRUQAP )   Missed doses in the last 4 weeks: 0   Patient/Caregiver did not have any additional questions or concerns.   Therapeutic benefit summary: Unable to assess   Adverse events/side effects summary: Experienced adverse events/side effects (mild tolerable diarrhea 3-4 times per month, easily treated with immodium)   Patient's therapy is appropriate to: Continue    Goals Addressed             This Visit's Progress    Slow Disease Progression       Patient is unable to be assessed as therapy was recently initiated. Patient will maintain adherence          Follow up: 3 months  Shoshana Johal M Kenzlee [place] Specialty Pharmacist

## 2024-02-24 NOTE — Progress Notes (Signed)
 Specialty Pharmacy Refill Coordination Note  Amber Blevins is a 73 y.o. female contacted today regarding refills of specialty medication(s) Capivasertib  (TRUQAP )   Patient requested Delivery   Delivery date: 02/29/24   Verified address: [address] [facility] [id number]   Medication will be filled on 02/28/24.

## 2024-02-29 ENCOUNTER — Other Ambulatory Visit: Payer: Self-pay

## 2024-02-29 ENCOUNTER — Other Ambulatory Visit (HOSPITAL_COMMUNITY): Payer: Self-pay

## 2024-02-29 ENCOUNTER — Encounter: Payer: Self-pay | Admitting: Oncology

## 2024-02-29 ENCOUNTER — Other Ambulatory Visit: Payer: Self-pay | Admitting: Oncology

## 2024-02-29 DIAGNOSIS — C50919 Malignant neoplasm of unspecified site of unspecified female breast: Secondary | ICD-10-CM

## 2024-02-29 MED ORDER — CAPIVASERTIB 200 MG PO TABS
400.0000 mg | ORAL_TABLET | Freq: Two times a day (BID) | ORAL | 0 refills | Status: DC
  Filled 2024-02-29: qty 64, 28d supply, fill #0

## 2024-03-01 ENCOUNTER — Other Ambulatory Visit: Payer: Self-pay

## 2024-03-02 ENCOUNTER — Inpatient Hospital Stay: Attending: Oncology

## 2024-03-02 ENCOUNTER — Other Ambulatory Visit: Payer: Self-pay

## 2024-03-02 ENCOUNTER — Inpatient Hospital Stay: Admitting: Oncology

## 2024-03-02 ENCOUNTER — Inpatient Hospital Stay

## 2024-03-02 ENCOUNTER — Ambulatory Visit

## 2024-03-02 ENCOUNTER — Encounter: Payer: Self-pay | Admitting: Oncology

## 2024-03-02 ENCOUNTER — Other Ambulatory Visit: Payer: Self-pay | Admitting: Oncology

## 2024-03-02 ENCOUNTER — Other Ambulatory Visit

## 2024-03-02 ENCOUNTER — Ambulatory Visit: Admitting: Oncology

## 2024-03-02 VITALS — BP 141/71 | HR 79 | Temp 96.9°F | Resp 18 | Wt 187.8 lb

## 2024-03-02 DIAGNOSIS — Z5111 Encounter for antineoplastic chemotherapy: Secondary | ICD-10-CM | POA: Diagnosis present

## 2024-03-02 DIAGNOSIS — Z923 Personal history of irradiation: Secondary | ICD-10-CM | POA: Diagnosis not present

## 2024-03-02 DIAGNOSIS — Z1721 Progesterone receptor positive status: Secondary | ICD-10-CM | POA: Diagnosis not present

## 2024-03-02 DIAGNOSIS — Z1732 Human epidermal growth factor receptor 2 negative status: Secondary | ICD-10-CM | POA: Insufficient documentation

## 2024-03-02 DIAGNOSIS — C7951 Secondary malignant neoplasm of bone: Secondary | ICD-10-CM

## 2024-03-02 DIAGNOSIS — T451X5A Adverse effect of antineoplastic and immunosuppressive drugs, initial encounter: Secondary | ICD-10-CM | POA: Insufficient documentation

## 2024-03-02 DIAGNOSIS — C50919 Malignant neoplasm of unspecified site of unspecified female breast: Secondary | ICD-10-CM | POA: Insufficient documentation

## 2024-03-02 DIAGNOSIS — G620 Drug-induced polyneuropathy: Secondary | ICD-10-CM | POA: Insufficient documentation

## 2024-03-02 DIAGNOSIS — R7401 Elevation of levels of liver transaminase levels: Secondary | ICD-10-CM | POA: Diagnosis not present

## 2024-03-02 DIAGNOSIS — K521 Toxic gastroenteritis and colitis: Secondary | ICD-10-CM

## 2024-03-02 DIAGNOSIS — C787 Secondary malignant neoplasm of liver and intrahepatic bile duct: Secondary | ICD-10-CM | POA: Diagnosis present

## 2024-03-02 DIAGNOSIS — Z170 Estrogen receptor positive status [ER+]: Secondary | ICD-10-CM | POA: Insufficient documentation

## 2024-03-02 LAB — CBC WITH DIFFERENTIAL (CANCER CENTER ONLY)
Abs Immature Granulocytes: 0.07 10*3/uL (ref 0.00–0.07)
Basophils Absolute: 0.1 10*3/uL (ref 0.0–0.1)
Basophils Relative: 1 %
Eosinophils Absolute: 0.4 10*3/uL (ref 0.0–0.5)
Eosinophils Relative: 5 %
HCT: 37.2 % (ref 36.0–46.0)
Hemoglobin: 12.1 g/dL (ref 12.0–15.0)
Immature Granulocytes: 1 %
Lymphocytes Relative: 14 %
Lymphs Abs: 1.1 10*3/uL (ref 0.7–4.0)
MCH: 31.3 pg (ref 26.0–34.0)
MCHC: 32.5 g/dL (ref 30.0–36.0)
MCV: 96.1 fL (ref 80.0–100.0)
Monocytes Absolute: 0.6 10*3/uL (ref 0.1–1.0)
Monocytes Relative: 8 %
Neutro Abs: 5.7 10*3/uL (ref 1.7–7.7)
Neutrophils Relative %: 71 %
Platelet Count: 219 10*3/uL (ref 150–400)
RBC: 3.87 MIL/uL (ref 3.87–5.11)
RDW: 13.5 % (ref 11.5–15.5)
WBC Count: 8 10*3/uL (ref 4.0–10.5)
nRBC: 0 % (ref 0.0–0.2)

## 2024-03-02 LAB — CMP (CANCER CENTER ONLY)
ALT: 44 U/L (ref 0–44)
AST: 42 U/L — ABNORMAL HIGH (ref 15–41)
Albumin: 4 g/dL (ref 3.5–5.0)
Alkaline Phosphatase: 87 U/L (ref 38–126)
Anion gap: 9 (ref 5–15)
BUN: 24 mg/dL — ABNORMAL HIGH (ref 8–23)
CO2: 24 mmol/L (ref 22–32)
Calcium: 8.9 mg/dL (ref 8.9–10.3)
Chloride: 104 mmol/L (ref 98–111)
Creatinine: 1.18 mg/dL — ABNORMAL HIGH (ref 0.44–1.00)
GFR, Estimated: 48 mL/min — ABNORMAL LOW (ref >60)
Glucose, Bld: 106 mg/dL — ABNORMAL HIGH (ref 70–99)
Potassium: 3.8 mmol/L (ref 3.5–5.1)
Sodium: 137 mmol/L (ref 135–145)
Total Bilirubin: 1.1 mg/dL (ref 0.0–1.2)
Total Protein: 6.9 g/dL (ref 6.5–8.1)

## 2024-03-02 MED ORDER — HEPARIN SOD (PORK) LOCK FLUSH 100 UNIT/ML IV SOLN
500.0000 [IU] | Freq: Once | INTRAVENOUS | Status: AC
  Administered 2024-03-02: 500 [IU] via INTRAVENOUS
  Filled 2024-03-02: qty 5

## 2024-03-02 MED ORDER — FULVESTRANT 250 MG/5ML IM SOSY
500.0000 mg | PREFILLED_SYRINGE | Freq: Once | INTRAMUSCULAR | Status: AC
  Administered 2024-03-02: 500 mg via INTRAMUSCULAR
  Filled 2024-03-02: qty 10

## 2024-03-02 MED ORDER — SODIUM CHLORIDE 0.9 % IV SOLN
INTRAVENOUS | Status: DC
  Filled 2024-03-02: qty 250

## 2024-03-02 MED ORDER — ZOLEDRONIC ACID 4 MG/5ML IV CONC
3.5000 mg | Freq: Once | INTRAVENOUS | Status: AC
  Administered 2024-03-02: 3.5 mg via INTRAVENOUS
  Filled 2024-03-02: qty 4.38

## 2024-03-02 NOTE — Progress Notes (Signed)
 Hematology/Oncology Progress note Telephone:([phone] Fax:([phone]) [phone]     CHIEF COMPLAINTS/REASON FOR VISIT:  metastatic breast caner ASSESSMENT & PLAN:   Cancer Staging  Primary malignant neoplasm of breast with metastasis (HCC) Staging form: Breast, AJCC 8th Edition - Clinical stage from 07/12/2021: Stage IV (rcTX, cNX, pM1, ER+, PR+, HER2-) - Signed by Timmy Forbes, MD on 03/08/2022   Primary malignant neoplasm of breast with metastasis (HCC) 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  Labs are reviewed and discussed with patient. Continue fulvestrant  and Capivasertib  [400mg  Q12h on D1-4 of each week].  fulvestrant  monthly.  She tolerates well.  Check fasting glucose on D3 or 4 of weeks 1, 2, 4, 6, 8.     Chemotherapy induced diarrhea At baseline, about once a week, not worse. Imodium PRN  Chemotherapy-induced neuropathy (HCC) #Grade 2 neuropathy, continue gabapentin ,  Continue gabapentin  600 mg in a.m., she lower the night time dose to 900 mg  Declined acupuncture    Encounter for antineoplastic chemotherapy Chemotherapy plan as listed above   Metastasis to bone [facility]) 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 Continue calcium  and vitamin D supplementation. Aaron Aas  MRI brain showed enlarging right frontal calvarium - s/p RT  Zometa  monthly now, consider eventually increase interval to Q68months.   Transaminitis Improving.  Ok to resume Statin.        No orders of the defined types  were placed in this encounter.   Follow up 2 weeks lab MD  All questions were answered. The patient knows to call the clinic with any problems, questions or concerns.  Timmy Forbes, MD, PhD [facility] Hematology Oncology 03/02/2024      HISTORY OF PRESENTING ILLNESS:   Amber Blevins is a  73 y.o.  female with PMH listed below presents for follow up for treatment of metastatic breast cancer.   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 Additional metastatic involvement T12, L1 through L3.  No primary tumor site identified. Top differential considerations are Metastatic Disease Unknown primary, less likely Lymphoma or Multiple Myeloma.   2. Superimposed lumbar spine degeneration, including degenerative moderate to severe left L3 and L4 nerve level impingement from disc herniation.      07/02/2021 Imaging   CT chest abdomen pelvis showed innumerable small pulmonary and pleural nodules consistent with diffuse metastasis.  Associated with probable malignant pleural effusion.  Mediastinal and hilar lymphadenopathy consistent with metastatic disease.  Hepatic and bilateral adrenal gland metastasis.  Diffuse extensive destructive metastatic bone disease involving pelvis.  07/12/2021 Cancer Staging   Staging form: Breast, AJCC 8th Edition - Clinical stage from 07/12/2021: Stage IV (rcTX,  cNX, pM1, ER+, PR+, HER2-) - Signed by Timmy Forbes, MD on 03/08/2022 Stage prefix: Recurrence   07/17/2021 Initial Diagnosis   Metastatic breast cancer -history of breast cancer, diagnosed in 2010 Left-sided T2 (2.4cm) N0, ER/PR positive, HER2 negative IDC of the breast, s/p lumpectomy by Dr.Meyer at [facility] Oncotype score of 11 who completed radiation and she finished 10 years of Femara  from 05/2009 and stopped in 2020.  -07/12/2021 patient underwent right thoracentesis.  Cytology was positive for metastatic carcinoma, compatible with breast origin.  ER/PR +, HER2 negative -07/16/2021, patient underwent iliac bone biopsy.  Positive for metastatic carcinoma.   07/21/2021 - 09/16/2021 Chemotherapy   Patient is on Treatment Plan : BREAST Paclitaxel  D1,8,15 q28d     09/04/2021 Imaging   CT chest abdomen pelvis showed improvement of the pulmonary nodularity and thickening in the left and the right lung.  Persistent nodular interstitial and pleural thickening remains.  Improvement of mediastinal and hilar adenopathy, hepatic metastatic lesions are similar.  Adrenal gland metastasis is similar.  Interval increase in the sclerotic bone metastasis.    09/30/2021 -  Chemotherapy   started on abemaciclib  100 mg twice daily and letrozole .   02/24/2022 Imaging    CT chest abdomen pelvis with contrast showed unchanged bilateral pleural effusion with associated intralobular septal thickening of the lung bases.  Without discrete nodularity consistent with stable appearance of treated pleural and lymphangitic metastatic disease.  Stable or minimally diminished subcentimeter liver lesions.  Unchanged left adrenal metastasis.  Unchanged widespread sclerotic osseous metastatic disease involving the included axial and appendicular skeleton.  No evidence of new metastatic disease in the chest abdomen or pelvis.  Coronary artery disease.   03/06/2022 Procedure   Ultrasound guided thoracentesis, removal of pleural fluid.     05/29/2022 Imaging   CT chest abdomen pelvis 1. Stable CT of the chest, abdomen and pelvis. 2. Unchanged appearance of bilateral pleural effusions, pleural nodularity, and interlobular septal thickening compatible with pleural and lymphangitic metastatic disease. 3. Stable appearance of multifocal sclerotic bone metastases. 4. Multifocal low-attenuation liver lesions are unchanged in the interval. 5. Stable appearance of left adrenal gland metastases. 6. Stable appearance of small pericardial effusion.7. No new or progressive disease identified.8. Aortic Atherosclerosis (ICD10-I70.0).   06/06/2022 Imaging   Bone scan Constellation of findings are consistent with multifocal osseous metastatic disease throughout the axial and appendicular skeleton.Of note, osseous metastatic disease involves the RIGHT greater than LEFT femur. Consider evaluation with dedicated hip and femur radiographs to evaluate for overall disease burden and potential risk for pathologic fracture.   08/31/2022 Imaging   CT chest abdomen pelvis w contrast 1. Unchanged small right, trace left pleural effusions with associated diffuse interlobular septal thickening and fine fissural and perilymphatic nodularity throughout the lungs, findings consistent with lymphangitic metastatic disease. 2. Stable or slightly diminished very subtle hypodense lesion of the central right lobe of the liver, hepatic segment VIII measuring 0.6 cm, previously 0.8 cm. Additional unchanged, very subtle hypodense lesion of hepatic segment IV B measuring 1.1 cm. 3. Unchanged widespread sclerotic osseous metastatic disease throughout the included axial and proximal appendicular skeleton, particularly dense in the lower lumbar spine and sacrum. 4. Unchanged left adrenal nodule measuring 1.7 x 1.1 cm. 5. Overall constellation of findings is consistent with stable metastatic disease. 6. Coronary artery disease.    12/01/2022 Miscellaneous   [facility] liquid  biopsy -  PIK3CA mutation. 0.1% Not enough tissue for tissue NGS   03/08/2023 Imaging   Bone scan showed Multifocal osseous metastatic disease. One new focus along the right side of the skull   08/23/2023 - 09/06/2023 Radiation Therapy   Skull radiation.    12/24/2023 Imaging   CT chest abdomen pelvis wo contrast showed 1. 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 2. New indistinct 0.6 cm medial right lower lobe pulmonary nodule, indeterminate for inflammatory nodule versus pulmonary metastasis. Suggest attention on follow-up chest CT in 3 months. 3. Widespread patchy sclerotic osseous metastatic disease, not appreciably changed. 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 4. Stable small layering right pleural effusion. Stable small pericardial effusion. 5. Three-vessel coronary atherosclerosis. 6. Long-term stability of left adrenal nodule favoring an adenoma. 7. Moderate left colonic diverticulosis. 8.  Aortic Atherosclerosis (ICD10-I70.0).    02/02/2024 - 02/02/2024 Chemotherapy   Patient is on Treatment Plan : BREAST Fam-Trastuzumab Deruxtecan-nxki (Enhertu) (5.4) q21d      Procedure   Liver biopsy Pathology showed metasatic adenocarcinoma Immunohistochemical stains show that the tumor cells are positive for CK7 and GATA3 while they are negative for CK20 and CDX2, consistent with patient's clinical history of primary breast carcinoma.     ER 95%, PR 0%, HER2 low (1+), Ki 67 5% NGS showed PIK3CA mutation, CHEK mutation.    02/07/2024 -  Chemotherapy   Started on Capivasertib  400mg  Q12h on D1-4 of each week   Metastasis to bone (HCC)  02/04/2022 Initial Diagnosis   Metastasis to bone (HCC)   02/02/2024 - 02/02/2024 Chemotherapy   Patient is on Treatment Plan : BREAST Fam-Trastuzumab Deruxtecan-nxki (Enhertu) (5.4) q21d      Patient follows up with cardiology for evaluation of bradycardia.  Cardiology notes reviewed.  72-hour Holter  monitor revealed revealed predominant sinus rhythm with mean heart rate of 96 bpm, heart rate range 53 to 129 bpm, frequent premature ventricular contractions (27% burden), and intermittent atrial fibrillation (5% burden) with longest episode 3 hours. CHA2DS2-VASc score of 3, per cardiology recommendation patient was started on Eliquis 5 mg twice daily.     INTERVAL HISTORY BECKIE VISCARDI is a 73 y.o. female who has above history reviewed by me today presents for follow up visit for metastatic breast cancer Occasional back and hip pain..   No Fever or chills. Denies abdominal pain,nausea or vomiting.  Started on Capivasertib  400mg  Q12h on D1-4 of each week. So far she tolerates well. No significant side effects.     .  Review of Systems  Constitutional:  Negative for appetite change, chills, fatigue and fever.  HENT:   Negative for hearing loss and voice change.   Eyes:  Negative for eye problems.  Respiratory:  Negative for chest tightness, cough and shortness of breath.   Cardiovascular:  Negative for chest pain.  Gastrointestinal:  Negative for abdominal distention, abdominal pain and blood in stool.  Endocrine: Negative for hot flashes.  Genitourinary:  Negative for difficulty urinating and frequency.   Musculoskeletal:  Negative for arthralgias.  Skin:  Negative for itching and rash.  Neurological:  Positive for numbness. Negative for extremity weakness.  Hematological:  Negative for adenopathy.  Psychiatric/Behavioral:  Negative for confusion.      MEDICAL HISTORY:  Past Medical History:  Diagnosis Date   A-fib [facility])    Breast cancer (HCC)    COPD (chronic obstructive pulmonary disease) (HCC)    Family history of adverse reaction to anesthesia    brother has  problem with coming out of anesthesia   High cholesterol    Hypertension    Personal history of radiation therapy    Pre-diabetes     SURGICAL HISTORY: Past Surgical History:  Procedure Laterality Date    BREAST LUMPECTOMY Left    2010   COLONOSCOPY     EYE SURGERY Left    Cataract surgery   PORTACATH PLACEMENT N/A 08/01/2021   Procedure: INSERTION PORT-A-CATH;  Surgeon: Eldred Grego, MD;  Location: [facility] ORS;  Service: General;  Laterality: N/A;   thorocentesis Right 07/10/2021   and another on 07/25/21    SOCIAL HISTORY: Social History   Socioeconomic History   Marital status: Married    Spouse name: Not on file   Number of children: Not on file   Years of education: Not on file   Highest education level: Not on file  Occupational History   Not on file  Tobacco Use   Smoking status: Former    Current packs/day: 0.00    Average packs/day: 1 pack/day for 25.0 years (25.0 ttl pk-yrs)    Types: Cigarettes    Start date: 63    Quit date: 25    Years since quitting: 35.4   Smokeless tobacco: Never  Vaping Use   Vaping status: Never Used  Substance and Sexual Activity   Alcohol use: Not Currently   Drug use: Never   Sexual activity: Not on file  Other Topics Concern   Not on file  Social History Narrative   Not on file   Social Drivers of Health   Financial Resource Strain: Low Risk  (07/26/2023)   Received from [facility]   Overall Financial Resource Strain (CARDIA)    Difficulty of Paying Living Expenses: Not hard at all  Food Insecurity: No Food Insecurity (07/26/2023)   Received from [facility]   Hunger Vital Sign    Worried About Running Out of Food in the Last Year: Never true    Ran Out of Food in the Last Year: Never true  Transportation Needs: No Transportation Needs (07/26/2023)   Received from [facility] - Transportation    In the past 12 months, has lack of transportation kept you from medical appointments or from getting medications?: No    Lack of Transportation (Non-Medical): No  Physical Activity: Not on file  Stress: Not on file  Social Connections: Not on file   Intimate Partner Violence: Not on file    FAMILY HISTORY: Family History  Problem Relation Age of Onset   Cancer Mother        gynecological   Lung cancer Mother    Diabetes Father    Heart disease Father    Parkinson's disease Father    Brain cancer Brother    Bladder Cancer Brother    Pulmonary disease Brother    Rheumatic fever Brother    Breast cancer Neg Hx     ALLERGIES:  is allergic to green tea (camellia sinensis), melaleuca viridiflora, tea tree oil, and lisinopril.  MEDICATIONS:  Current Outpatient Medications  Medication Sig Dispense Refill   acetaminophen  (TYLENOL ) 650 MG CR tablet Take 650 mg by mouth every 8 (eight) hours as needed for pain.     Boswellia-Glucosamine-Vit D (OSTEO BI-FLEX-GLUCOS/5-LOXIN) TABS Take 1 capsule by mouth in the morning and at bedtime.     Calcium  Carb-Cholecalciferol 600-400 MG-UNIT TABS Take 1 tablet by mouth 2 (two) times daily.  capivasertib  (TRUQAP ) 200 MG tablet Take 2 tablets (400 mg total) by mouth 2 (two) times daily. Take for 4 days, then hold for 3 days. Repeat every 7 days. 64 tablet 0   cetirizine (ZYRTEC) 10 MG chewable tablet Chew 10 mg by mouth daily as needed for allergies.     cyanocobalamin 1000 MCG tablet Take 1,000 mcg by mouth daily.     diphenoxylate -atropine  (LOMOTIL ) 2.5-0.025 MG tablet Take 1 tablet by mouth 4 (four) times daily as needed for diarrhea or loose stools. 30 tablet 0   ELIQUIS 5 MG TABS tablet Take 5 mg by mouth 2 (two) times daily.     gabapentin  (NEURONTIN ) 300 MG capsule TAKE 2 CAPSULES BY MOUTH IN THE MORNING AND 3 CAPSULES IN THE EVENING 450 capsule 0   Iron-Vitamin C (VITRON-C) 65-125 MG TABS Take 1 tablet by mouth daily.     lidocaine -prilocaine  (EMLA ) cream Apply small amount to port and cover with saran wrap 1-2 hours prior to port access 30 g 5   Loperamide HCl (IMODIUM PO) Take by mouth.     losartan (COZAAR) 50 MG tablet Take 50 mg by mouth daily.     magnesium chloride (SLOW-MAG) 64  MG TBEC SR tablet Take 1 tablet by mouth at bedtime.     ondansetron  (ZOFRAN ) 8 MG tablet Take 1 tablet (8 mg total) by mouth every 8 (eight) hours as needed for nausea or vomiting. 20 tablet 1   Turmeric (QC TUMERIC COMPLEX PO) Take 1 capsule by mouth at bedtime.     atorvastatin  (LIPITOR) 40 MG tablet Take 40 mg by mouth at bedtime. (Patient not taking: Reported on 02/02/2024)     Docusate Sodium (DSS) 100 MG CAPS Take 1 capsule by mouth daily. (Patient not taking: Reported on 02/17/2024)     melatonin (MELATONIN MAXIMUM STRENGTH) 5 MG TABS Take 1 tablet by mouth at bedtime as needed. (Patient not taking: Reported on 03/02/2024)     No current facility-administered medications for this visit.   Facility-Administered Medications Ordered in Other Visits  Medication Dose Route Frequency Provider Last Rate Last Admin   0.9 %  sodium chloride  infusion   Intravenous Continuous Timmy Forbes, MD   Stopped at 03/02/24 [phone]   sodium chloride  flush (NS) 0.9 % injection 10 mL  10 mL Intravenous Once Timmy Forbes, MD         PHYSICAL EXAMINATION: ECOG PERFORMANCE STATUS: 1 - Symptomatic but completely ambulatory Vitals:   03/02/24 0836  BP: (!) 141/71  Pulse: 79  Resp: 18  Temp: (!) 96.9 F (36.1 C)  SpO2: 100%   Filed Weights   03/02/24 0836  Weight: 187 lb 12.8 oz (85.2 kg)    Physical Exam Constitutional:      General: She is not in acute distress.    Appearance: She is not diaphoretic.  HENT:     Head: Normocephalic and atraumatic.     Nose: Nose normal.     Mouth/Throat:     Pharynx: No oropharyngeal exudate.  Eyes:     General: No scleral icterus.    Pupils: Pupils are equal, round, and reactive to light.  Cardiovascular:     Rate and Rhythm: Normal rate and regular rhythm.     Heart sounds: No murmur heard. Pulmonary:     Effort: Pulmonary effort is normal. No respiratory distress.     Comments: Mild decreased breath sound bilaterally.. L>R Abdominal:     General: There is no  distension.  Palpations: Abdomen is soft.     Tenderness: There is no abdominal tenderness.  Musculoskeletal:        General: Normal range of motion.     Cervical back: Normal range of motion and neck supple.  Skin:    General: Skin is warm and dry.     Findings: No erythema.  Neurological:     Mental Status: She is alert and oriented to person, place, and time.     Cranial Nerves: No cranial nerve deficit.     Motor: No abnormal muscle tone.     Coordination: Coordination normal.  Psychiatric:        Mood and Affect: Affect normal.       LABORATORY DATA:  I have reviewed the data as listed     Latest Ref Rng & Units 03/02/2024    8:03 AM 02/17/2024    8:19 AM 02/02/2024    8:04 AM  CBC  WBC 4.0 - 10.5 K/uL 8.0  7.2  5.0   Hemoglobin 12.0 - 15.0 g/dL 54.0  98.1  19.1   Hematocrit 36.0 - 46.0 % 37.2  34.7  31.5   Platelets 150 - 400 K/uL 219  239  185       Latest Ref Rng & Units 03/02/2024    8:03 AM 02/17/2024    8:19 AM 02/09/2024    7:57 AM  CMP  Glucose 70 - 99 mg/dL 478  295  621   BUN 8 - 23 mg/dL 24  25  22    Creatinine 0.44 - 1.00 mg/dL 3.08  6.57  8.46   Sodium 135 - 145 mmol/L 137  137  138   Potassium 3.5 - 5.1 mmol/L 3.8  4.0  3.9   Chloride 98 - 111 mmol/L 104  105  105   CO2 22 - 32 mmol/L 24  24  24    Calcium  8.9 - 10.3 mg/dL 8.9  8.8  8.8   Total Protein 6.5 - 8.1 g/dL 6.9  7.1  7.0   Total Bilirubin 0.0 - 1.2 mg/dL 1.1  0.8  0.7   Alkaline Phos 38 - 126 U/L 87  88  95   AST 15 - 41 U/L 42  42  62   ALT 0 - 44 U/L 44  41  81      RADIOGRAPHIC STUDIES: I have personally reviewed the radiological images as listed and agreed with the findings in the report. MR Brain W Wo Contrast Result Date: 01/27/2024 EXAM: MRI BRAIN WITH AND WITHOUT CONTRAST 01/21/2024 10:07:58 AM TECHNIQUE: Multiplanar multisequence MRI of the head/brain was performed with and without the administration of intravenous contrast. COMPARISON: 07/16/2023 CLINICAL HISTORY: Breast  cancer. History of breast cancer with skull metastasis. History of radiation treatment. Evaluation for changes. No symptoms. FINDINGS: INTRACRANIAL STRUCTURES AND VENTRICLES: There is no acute infarct. No mass effect or midline shift. No evidence of an acute intracranial hemorrhage. The ventricles and sulci are normal in size and configuration. The sellar/suprasellar regions appear unremarkable. The normal signal voids within the major intracranial vessels appear maintained. No abnormal focus of enhancement is seen within the brain. Unchanged background of mild chronic small vessel disease. ORBITS: The visualized portion of the orbits demonstrate no acute abnormality. SINUSES: The visualized paranasal sinuses and mastoid air cells demonstrate no acute abnormality. BONES AND SOFT TISSUES: Slightly increased size of the enhancing skull lesion along the right coronal suture now measuring up to 21 mm (axial 131 series 18), previously 17  mm. No new enhancing lesions in the skull. IMPRESSION: 1. Slightly increased size of the enhancing skull lesion along the right coronal suture, now measuring up to 21 mm, previously 17 mm. 2. No new enhancing lesions in the skull. 3. No foci of abnormal enhancement in the brain. Electronically signed by: Audra Blend 01/27/2024 07:49 PM EDT RP Workstation: [id number]   NM Cardiac Muga Rest Result Date: 01/21/2024 CLINICAL DATA:  Breast cancer. Evaluate cardiac function in relation to chemotherapy. EXAM: NUCLEAR MEDICINE CARDIAC BLOOD POOL IMAGING (MUGA) TECHNIQUE: Cardiac multi-gated acquisition was performed at rest following intravenous injection of Tc-81m labeled red blood cells. RADIOPHARMACEUTICALS:  21.7 mCi Tc-64m pertechnetate in-vitro labeled red blood cells IV COMPARISON:  None Available. FINDINGS: No  focal wall motion abnormality of the left ventricle. Calculated left ventricular ejection fraction equals 54.5% IMPRESSION: Left ventricular ejection fraction equals54.5 %.  Electronically Signed   By: Deboraha Fallow M.D.   On: 01/21/2024 13:09   US  BIOPSY (LIVER) Result Date: 01/06/2024 INDICATION: 73 year old female referred for biopsy of liver mass EXAM: ULTRASOUND-GUIDED LIVER MASS BIOPSY MEDICATIONS: None. ANESTHESIA/SEDATION: Moderate (conscious) sedation was employed during this procedure. A total of Versed  1.0 mg and Fentanyl  50 mcg was administered intravenously by the radiology nurse. Total intra-service moderate Sedation Time: 13 minutes. The patient's level of consciousness and vital signs were monitored continuously by radiology nursing throughout the procedure under my direct supervision. COMPLICATIONS: None PROCEDURE: Informed written consent was obtained from the patient after a thorough discussion of the procedural risks, benefits and alternatives. All questions were addressed. Maximal Sterile Barrier Technique was utilized including caps, mask, sterile gowns, sterile gloves, sterile drape, hand hygiene and skin antiseptic. A timeout was performed prior to the initiation of the procedure. Ultrasound survey of the right liver lobe performed with images stored and sent to PACs. The right lower thorax/right upper abdomen was prepped with chlorhexidine  in a sterile fashion, and a sterile drape was applied covering the operative field. A sterile gown and sterile gloves were used for the procedure. Local anesthesia was provided with 1% Lidocaine . The patient was prepped and draped sterilely and the skin and subcutaneous tissues were generously infiltrated with 1% lidocaine . A 17 gauge introducer needle was then advanced under ultrasound guidance in an intercostal location into the right liver lobe, targeting heterogeneously of coag asses the right liver. The stylet was removed, and multiple separate 18 gauge core biopsy were retrieved. Samples were placed into formalin for transportation to the lab. Gel-Foam slurry then infused with a small amount of saline for  assistance with hemostasis. The needle was removed, and a final ultrasound image was performed. The patient tolerated the procedure well and remained hemodynamically stable throughout. No complications were encountered and no significant blood loss was encounter. IMPRESSION: Status post ultrasound-guided biopsy of right liver mass. Signed, Marciano Settles. Rexine Cater, RPVI [facility] Electronically Signed   By: Myrlene Asper D.O.   On: 01/06/2024 12:45   CT CHEST ABDOMEN PELVIS WO CONTRAST Result Date: 12/24/2023 CLINICAL DATA:  Breast cancer restaging. * Tracking Code: BO * EXAM: CT CHEST, ABDOMEN AND PELVIS WITHOUT CONTRAST TECHNIQUE: Multidetector CT imaging of the chest, abdomen and pelvis was performed following the standard protocol without IV contrast. RADIATION DOSE REDUCTION: This exam was performed according to the departmental dose-optimization program which includes automated exposure control, adjustment of the mA and/or kV according to patient size and/or use of iterative reconstruction technique. COMPARISON:  09/16/2023 CT chest, abdomen and  pelvis. FINDINGS: CT CHEST FINDINGS Cardiovascular: Normal heart size. Stable small pericardial effusion. Right internal jugular Port-A-Cath terminates near the cavoatrial junction. Three-vessel coronary atherosclerosis. Atherosclerotic nonaneurysmal thoracic aorta. Normal caliber pulmonary arteries. Mediastinum/Nodes: No significant thyroid nodules. Unremarkable esophagus. Surgical clip again noted in the left axilla. No axillary adenopathy. No pathologically enlarged mediastinal or discrete hilar nodes on these noncontrast images. Lungs/Pleura: No pneumothorax. Small layering right pleural effusion is stable. No significant left pleural effusion. No acute consolidative airspace disease or lung masses. Indistinct 0.6 cm medial right lower lobe nodule on series 4/image 100, new. No additional significant  pulmonary nodules. Musculoskeletal: Patchy sclerotic osseous lesions in the manubrium, sternum, right clavicular head, proximal left humerus, inferior left glenoid, thoracic spine and bilateral ribs are all stable. No new focal osseous lesions in the chest. 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 Mild thoracic spondylosis. CT ABDOMEN PELVIS FINDINGS Hepatobiliary: At least 4 scattered hypodense liver masses, all increased or new. Representative segment 4B left liver 2.7 x 1.9 cm mass on series 2/image 53, previously 2.1 x 1.9 cm. Representative segment 5 right liver 2.2 x 1.8 cm mass on series 2/image 54, previously 1.2 x 1.2 cm. Inferior right liver 1.1 x 0.7 cm mass in series 2/image 66, new. Normal gallbladder with no radiopaque cholelithiasis. No biliary ductal dilatation. Pancreas: Normal, with no mass or duct dilation. Spleen: Normal size. No mass. Adrenals/Urinary Tract: Left adrenal 1.6 cm nodule with density 36 HU, stable on numerous CTs back to at least 09/03/2021. No right adrenal nodules. No hydronephrosis. No renal stones. Chronic simple parapelvic bilateral renal cysts, for which no follow-up imaging is recommended. Normal bladder. Stomach/Bowel: Normal non-distended stomach. Normal caliber small bowel with no small bowel wall thickening. Normal appendix. Moderate left colonic diverticulosis with no large bowel wall thickening or significant pericolonic fat stranding. Vascular/Lymphatic: Atherosclerotic nonaneurysmal abdominal aorta. Hazy central mesenteric fat stranding is not appreciably changed. No pathologically enlarged lymph nodes in the abdomen or pelvis. Reproductive: Grossly normal uterus.  No adnexal mass. Other: No pneumoperitoneum, ascites or focal fluid collection. Musculoskeletal: Widespread patchy sclerotic osseous lesions throughout the lumbar spine, entire sacrum, bilateral iliac wings, right ischium and bilateral proximal femora, not appreciably  changed. No appreciable new focal osseous lesions. Moderate lumbar spondylosis. IMPRESSION: 1. 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 2. New indistinct 0.6 cm medial right lower lobe pulmonary nodule, indeterminate for inflammatory nodule versus pulmonary metastasis. Suggest attention on follow-up chest CT in 3 months. 3. Widespread patchy sclerotic osseous metastatic disease, not appreciably changed. 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 4. Stable small layering right pleural effusion. Stable small pericardial effusion. 5. Three-vessel coronary atherosclerosis. 6. Long-term stability of left adrenal nodule favoring an adenoma. 7. Moderate left colonic diverticulosis. 8.  Aortic Atherosclerosis (ICD10-I70.0). Electronically Signed   By: Levell Reach M.D.   On: 12/24/2023 10:49

## 2024-03-02 NOTE — Assessment & Plan Note (Signed)
 Chemotherapy plan as listed above

## 2024-03-02 NOTE — Assessment & Plan Note (Addendum)
 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  Labs are reviewed and discussed with patient. Continue fulvestrant  and Capivasertib  [400mg  Q12h on D1-4 of each week].  fulvestrant  monthly.  She tolerates well.  Check fasting glucose on D3 or 4 of weeks 1, 2, 4, 6, 8.

## 2024-03-02 NOTE — Assessment & Plan Note (Signed)
 Improving.  Ok to resume Statin.

## 2024-03-02 NOTE — Assessment & Plan Note (Signed)
 At baseline, about once a week, not worse. Imodium PRN

## 2024-03-02 NOTE — Assessment & Plan Note (Signed)
#  Grade 2 neuropathy, continue gabapentin,  Continue gabapentin 600 mg in a.m., she lower the night time dose to 900 mg  Declined acupuncture

## 2024-03-02 NOTE — Assessment & Plan Note (Signed)
 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 Continue calcium  and vitamin D supplementation. Amber Blevins  MRI brain showed enlarging right frontal calvarium - s/p RT  Zometa  monthly now, consider eventually increase interval to Q10months.

## 2024-03-02 NOTE — Patient Instructions (Signed)
 [facility] - A DEPT OF [facility]  Discharge Instructions: Thank you for choosing [facility] to provide your oncology and hematology care.  If you have a lab appointment with the Cancer Center, please go directly to the Cancer Center and check in at the registration area.  Wear comfortable clothing and clothing appropriate for easy access to any Portacath or PICC line.   We strive to give you quality time with your provider. You may need to reschedule your appointment if you arrive late (15 or more minutes).  Arriving late affects you and other patients whose appointments are after yours.  Also, if you miss three or more appointments without notifying the office, you may be dismissed from the clinic at the provider's discretion.      For prescription refill requests, have your pharmacy contact our office and allow 72 hours for refills to be completed.    Today you received the following chemotherapy and/or immunotherapy agents Faslodex and Zometa.      To help prevent nausea and vomiting after your treatment, we encourage you to take your nausea medication as directed.  BELOW ARE SYMPTOMS THAT SHOULD BE REPORTED IMMEDIATELY: *FEVER GREATER THAN 100.4 F (38 C) OR HIGHER *CHILLS OR SWEATING *NAUSEA AND VOMITING THAT IS NOT CONTROLLED WITH YOUR NAUSEA MEDICATION *UNUSUAL SHORTNESS OF BREATH *UNUSUAL BRUISING OR BLEEDING *URINARY PROBLEMS (pain or burning when urinating, or frequent urination) *BOWEL PROBLEMS (unusual diarrhea, constipation, pain near the anus) TENDERNESS IN MOUTH AND THROAT WITH OR WITHOUT PRESENCE OF ULCERS (sore throat, sores in mouth, or a toothache) UNUSUAL RASH, SWELLING OR PAIN  UNUSUAL VAGINAL DISCHARGE OR ITCHING   Items with * indicate a potential emergency and should be followed up as soon as possible or go to the Emergency Department if any problems should occur.  Please show the CHEMOTHERAPY ALERT CARD or  IMMUNOTHERAPY ALERT CARD at check-in to the Emergency Department and triage nurse.  Should you have questions after your visit or need to cancel or reschedule your appointment, please contact [facility] - A DEPT OF [facility]  [phone] and follow the prompts.  Office hours are 8:00 a.m. to 4:30 p.m. Monday - Friday. Please note that voicemails left after 4:00 p.m. may not be returned until the following business day.  We are closed weekends and major holidays. You have access to a nurse at all times for urgent questions. Please call the main number to the clinic [phone] and follow the prompts.  For any non-urgent questions, you may also contact your provider using MyChart. We now offer e-Visits for anyone 38 and older to request care online for non-urgent symptoms. For details visit [email or web address].   Also download the MyChart app! Go to the app store, search "MyChart", open the app, select [facility], and log in with your MyChart username and password.

## 2024-03-03 LAB — CANCER ANTIGEN 27.29: CA 27.29: 56.9 U/mL — ABNORMAL HIGH (ref 0.0–38.6)

## 2024-03-03 LAB — CANCER ANTIGEN 15-3: CA 15-3: 41.8 U/mL — ABNORMAL HIGH (ref 0.0–25.0)

## 2024-03-08 ENCOUNTER — Inpatient Hospital Stay

## 2024-03-08 ENCOUNTER — Encounter: Payer: Self-pay | Admitting: Licensed Clinical Social Worker

## 2024-03-08 ENCOUNTER — Inpatient Hospital Stay: Admitting: Licensed Clinical Social Worker

## 2024-03-08 DIAGNOSIS — Z8042 Family history of malignant neoplasm of prostate: Secondary | ICD-10-CM

## 2024-03-08 DIAGNOSIS — Z8052 Family history of malignant neoplasm of bladder: Secondary | ICD-10-CM | POA: Diagnosis not present

## 2024-03-08 DIAGNOSIS — Z8041 Family history of malignant neoplasm of ovary: Secondary | ICD-10-CM | POA: Diagnosis not present

## 2024-03-08 DIAGNOSIS — C50919 Malignant neoplasm of unspecified site of unspecified female breast: Secondary | ICD-10-CM | POA: Diagnosis not present

## 2024-03-08 NOTE — Progress Notes (Signed)
 REFERRING PROVIDER: Timmy Forbes, MD [address] [place],  [place] [address]  PRIMARY PROVIDER:  Arno Lapidus, MD  PRIMARY REASON FOR VISIT:  1. Primary malignant neoplasm of breast with metastasis (HCC)   2. Family history of ovarian cancer   3. Family history of prostate cancer   4. Family history of bladder cancer      HISTORY OF PRESENT ILLNESS:   Amber Blevins, a 73 y.o. female, was seen for a [facility] cancer genetics consultation at the request of Dr. Wilhelmenia Harada due to a personal and family history of cancer.  Amber Blevins presents to clinic today to discuss the possibility of a hereditary predisposition to cancer, genetic testing, and to further clarify her future cancer risks, as well as potential cancer risks for family members.    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 Additional metastatic involvement T12, L1 through L3.  No primary tumor site identified. Top differential considerations are Metastatic Disease Unknown primary, less likely Lymphoma or Multiple Myeloma.   2. Superimposed lumbar spine degeneration, including degenerative moderate to severe left L3 and L4 nerve level impingement from disc herniation.      07/02/2021 Imaging   CT chest abdomen pelvis showed innumerable small pulmonary and pleural nodules consistent with diffuse metastasis.   Associated with probable malignant pleural effusion.  Mediastinal and hilar lymphadenopathy consistent with metastatic disease.  Hepatic and bilateral adrenal gland metastasis.  Diffuse extensive destructive metastatic bone disease involving pelvis.   07/12/2021 Cancer Staging   Staging form: Breast, AJCC 8th Edition - Clinical stage from 07/12/2021: Stage IV (rcTX, cNX, pM1, ER+, PR+, HER2-) - Signed by Timmy Forbes, MD on 03/08/2022 Stage prefix: Recurrence   07/17/2021 Initial Diagnosis   Metastatic breast cancer -history of breast cancer, diagnosed in 2010 Left-sided T2 (2.4cm) N0, ER/PR positive, HER2 negative IDC of the breast, s/p lumpectomy by Dr[facility]Meyer at [facility]. Oncotype score of 11 who completed radiation and she finished 10 years of Femara  from 05/2009 and stopped in 2020.  -07/12/2021 patient underwent right thoracentesis.  Cytology was positive for metastatic carcinoma, compatible with breast origin.  ER/PR +, HER2 negative -07/16/2021, patient underwent iliac bone biopsy.  Positive for metastatic carcinoma.   07/21/2021 - 09/16/2021 Chemotherapy   Patient is on Treatment Plan : BREAST Paclitaxel  D1,8,15 q28d     09/04/2021 Imaging   CT chest abdomen pelvis showed improvement of the pulmonary nodularity and thickening in the left and the right lung.  Persistent nodular interstitial and pleural thickening remains.  Improvement of mediastinal and hilar adenopathy, hepatic metastatic lesions are similar.  Adrenal gland metastasis is similar.  Interval increase in the sclerotic bone metastasis.    09/30/2021 -  Chemotherapy   started on abemaciclib  100 mg twice daily and letrozole .   02/24/2022 Imaging    CT chest abdomen pelvis with contrast showed unchanged bilateral pleural effusion with associated intralobular septal thickening of  the lung bases.  Without discrete nodularity consistent with stable appearance of treated pleural and lymphangitic metastatic disease.  Stable or minimally  diminished subcentimeter liver lesions.  Unchanged left adrenal metastasis.  Unchanged widespread sclerotic osseous metastatic disease involving the included axial and appendicular skeleton.  No evidence of new metastatic disease in the chest abdomen or pelvis.  Coronary artery disease.   03/06/2022 Procedure   Ultrasound guided thoracentesis, removal of pleural fluid.    05/29/2022 Imaging   CT chest abdomen pelvis 1. Stable CT of the chest, abdomen and pelvis. 2. Unchanged appearance of bilateral pleural effusions, pleural nodularity, and interlobular septal thickening compatible with pleural and lymphangitic metastatic disease. 3. Stable appearance of multifocal sclerotic bone metastases. 4. Multifocal low-attenuation liver lesions are unchanged in the interval. 5. Stable appearance of left adrenal gland metastases. 6. Stable appearance of small pericardial effusion.7. No new or progressive disease identified.8. Aortic Atherosclerosis (ICD10-I70.0).   06/06/2022 Imaging   Bone scan Constellation of findings are consistent with multifocal osseous metastatic disease throughout the axial and appendicular skeleton.Of note, osseous metastatic disease involves the RIGHT greater than LEFT femur. Consider evaluation with dedicated hip and femur radiographs to evaluate for overall disease burden and potential risk for pathologic fracture.   08/31/2022 Imaging   CT chest abdomen pelvis w contrast 1. Unchanged small right, trace left pleural effusions with associated diffuse interlobular septal thickening and fine fissural and perilymphatic nodularity throughout the lungs, findings consistent with lymphangitic metastatic disease. 2. Stable or slightly diminished very subtle hypodense lesion of the central right lobe of the liver, hepatic segment VIII measuring 0.6 cm, previously 0.8 cm. Additional unchanged, very subtle hypodense lesion of hepatic segment IV B measuring 1.1 cm. 3. Unchanged widespread  sclerotic osseous metastatic disease throughout the included axial and proximal appendicular skeleton, particularly dense in the lower lumbar spine and sacrum. 4. Unchanged left adrenal nodule measuring 1.7 x 1.1 cm. 5. Overall constellation of findings is consistent with stable metastatic disease. 6. Coronary artery disease.    12/01/2022 Miscellaneous   [facility] liquid biopsy - PIK3CA mutation. 0.1% Not enough tissue for tissue NGS   03/08/2023 Imaging   Bone scan showed Multifocal osseous metastatic disease. One new focus along the right side of the skull   08/23/2023 - 09/06/2023 Radiation Therapy   Skull radiation.    12/24/2023 Imaging   CT chest abdomen pelvis wo contrast showed 1. 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 2. New indistinct 0.6 cm medial right lower lobe pulmonary nodule, indeterminate for inflammatory nodule versus pulmonary metastasis. Suggest attention on follow-up chest CT in 3 months. 3. Widespread patchy sclerotic osseous metastatic disease, not appreciably changed. 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 4. Stable small layering right pleural effusion. Stable small pericardial effusion. 5. Three-vessel coronary atherosclerosis. 6. Long-term stability of left adrenal nodule favoring an adenoma. 7. Moderate left colonic diverticulosis. 8.  Aortic Atherosclerosis (ICD10-I70.0).    02/02/2024 - 02/02/2024 Chemotherapy   Patient is on Treatment Plan : BREAST Fam-Trastuzumab Deruxtecan-nxki (Enhertu) (5.4) q21d      Procedure   Liver biopsy Pathology showed metasatic adenocarcinoma Immunohistochemical stains show that the tumor cells are positive for CK7 and GATA3 while they are negative for CK20 and CDX2, consistent with patient's clinical history of primary breast carcinoma.     ER 95%, PR 0%, HER2 low (1+), Ki 67 5% NGS showed PIK3CA mutation, CHEK mutation.    02/07/2024 -  Chemotherapy   Started on  Capivasertib   400mg  Q12h on D1-4 of each week   Metastasis to bone (HCC)  02/04/2022 Initial Diagnosis   Metastasis to bone (HCC)   02/02/2024 - 02/02/2024 Chemotherapy   Patient is on Treatment Plan : BREAST Fam-Trastuzumab Deruxtecan-nxki (Enhertu) (5.4) q21d      In 2010, at the age of 41, Amber Blevins was diagnosed with left breast cancer. In 2022, she was diagnosed with metastatic recurrence of this cancer. She reports having genetic counseling at [facility] in 2010 but did not undergo genetic testing at the time.  RISK FACTORS:  Menarche was at age 73.  First live birth at age 1.  Ovaries intact: yes.  Hysterectomy: no.  Menopausal status: postmenopausal.  Colonoscopy: yes; total 4 polyps.   Past Medical History:  Diagnosis Date   A-fib (HCC)    Breast cancer (HCC)    COPD (chronic obstructive pulmonary disease) (HCC)    Family history of adverse reaction to anesthesia    brother has problem with coming out of anesthesia   High cholesterol    Hypertension    Personal history of radiation therapy    Pre-diabetes     Past Surgical History:  Procedure Laterality Date   BREAST LUMPECTOMY Left    2010   COLONOSCOPY     EYE SURGERY Left    Cataract surgery   PORTACATH PLACEMENT N/A 08/01/2021   Procedure: INSERTION PORT-A-CATH;  Surgeon: Eldred Grego, MD;  Location: [facility] ORS;  Service: General;  Laterality: N/A;   thorocentesis Right 07/10/2021   and another on 07/25/21    FAMILY HISTORY:  We obtained a detailed, 4-generation family history.  Significant diagnoses are listed below: Family History  Problem Relation Age of Onset   Ovarian cancer Mother 68   Lung cancer Mother 21   Diabetes Father    Heart disease Father    Parkinson's disease Father    Bladder Cancer Brother 24   Pulmonary disease Brother    Rheumatic fever Brother    Prostate cancer Cousin 47   Prostate cancer Cousin 21   Breast cancer Neg Hx    Amber Blevins has 1 daughter, 63. She has 5  brothers and 2 sisters. One brother had bladder cancer at 65 and is living at 36.  Amber Blevins's mother had ovarian cancer (or possibly another cancer of GYN origin) at 19 and lung cancer at 8, she passed at 54. No other known cancers on mom's side of the family.  Amber Blevins's father passed at 44. Patient has 3 paternal cousins that had cancer, they were brothers. One had an unknown cancer, the other two had prostate cancer at 52 and 95.   Amber Blevins is unaware of previous family history of genetic testing for hereditary cancer risks. There is no reported Ashkenazi Jewish ancestry. There is no known consanguinity.    GENETIC COUNSELING ASSESSMENT: Amber Blevins is a 73 y.o. female with a personal and family history of cancer which is somewhat suggestive of a hereditary cancer syndrome and predisposition to cancer. We, therefore, discussed and recommended the following at today's visit.   DISCUSSION: We discussed that approximately 10% of breast  cancer is hereditary. Most cases of hereditary breast cancer are associated with BRCA1/2 genes, although there are other genes associated with hereditary breast cancer as well including the CHEK2 gene and other moderate risk genes. Cancers and risks are gene specific. We discussed that testing is beneficial for several reasons including knowing about cancer risks, identifying potential screening and risk-reduction options that  may be appropriate, and to understand if other family members could be at risk for cancer and allow them to undergo genetic testing.   We reviewed the characteristics, features and inheritance patterns of hereditary cancer syndromes. We also discussed genetic testing, including the appropriate family members to test, the process of testing, insurance coverage and turn-around-time for results. We discussed the implications of a negative, positive and/or variant of uncertain significant result. We recommended Amber Blevins pursue  genetic testing for the [facility] CancerNext+RNA gene panel.   The [facility] CancerNext+RNAinsight Panel includes sequencing, rearrangement analysis, and RNA analysis for the following 40 genes: APC, ATM, BAP1, BARD1, BMPR1A, BRCA1, BRCA2, BRIP1, CDH1, CDKN2A, CHEK2, FH, FLCN, MET, MLH1, MSH2, MSH6, MUTYH, NF1, NTHL1, PALB2, PMS2, PTEN, RAD51C, RAD51D, RPS20, SMAD4, STK11, TP53, TSC1, TSC2, and VHL (sequencing and deletion/duplication); AXIN2, HOXB13, MBD4, MSH3, POLD1 and POLE (sequencing only); EPCAM and GREM1 (deletion/duplication only).  Based on Amber Blevins's personal and family history of cancer, she meets medical criteria for genetic testing. Despite that she meets criteria, she may still have an out of pocket cost.   PLAN: After considering the risks, benefits, and limitations, Amber Blevins provided informed consent to pursue genetic testing. She will have her blood drawn at her next port flush appointment on 6/19 and the blood sample will be sent to [facility] for analysis of the CancerNext+RNA panel. Results should be available within approximately 2-3 weeks' time, at which point they will be disclosed by telephone to Amber Blevins, as will any additional recommendations warranted by these results. Amber Blevins will receive a summary of her genetic counseling visit and a copy of her results once available. This information will also be available in Epic.   Amber Blevins questions were answered to her satisfaction today. Our contact information was provided should additional questions or concerns arise. Thank you for the referral and allowing us  to share in the care of your patient.   Valri Gee, MS, [facility] Genetic Counselor [email or web address] Phone: [phone]  45 minutes were spent on the date of the encounter in service to the patient including preparation, face-to-face consultation, documentation and care coordination. Dr. Nelson Bandy was available for discussion  regarding this case.   _______________________________________________________________________ For Office Staff:  Number of people involved in session: 1 Was an Intern/ student involved with case: no

## 2024-03-16 ENCOUNTER — Other Ambulatory Visit

## 2024-03-16 ENCOUNTER — Other Ambulatory Visit: Payer: Self-pay | Admitting: Licensed Clinical Social Worker

## 2024-03-16 ENCOUNTER — Inpatient Hospital Stay

## 2024-03-16 ENCOUNTER — Other Ambulatory Visit: Payer: Self-pay

## 2024-03-16 DIAGNOSIS — Z95828 Presence of other vascular implants and grafts: Secondary | ICD-10-CM

## 2024-03-16 DIAGNOSIS — C50919 Malignant neoplasm of unspecified site of unspecified female breast: Secondary | ICD-10-CM

## 2024-03-16 DIAGNOSIS — Z5111 Encounter for antineoplastic chemotherapy: Secondary | ICD-10-CM | POA: Diagnosis not present

## 2024-03-16 LAB — BASIC METABOLIC PANEL - CANCER CENTER ONLY
Anion gap: 9 (ref 5–15)
BUN: 23 mg/dL (ref 8–23)
CO2: 25 mmol/L (ref 22–32)
Calcium: 9.3 mg/dL (ref 8.9–10.3)
Chloride: 107 mmol/L (ref 98–111)
Creatinine: 1.22 mg/dL — ABNORMAL HIGH (ref 0.44–1.00)
GFR, Estimated: 47 mL/min — ABNORMAL LOW (ref >60)
Glucose, Bld: 109 mg/dL — ABNORMAL HIGH (ref 70–99)
Potassium: 3.7 mmol/L (ref 3.5–5.1)
Sodium: 141 mmol/L (ref 135–145)

## 2024-03-16 LAB — GENETIC SCREENING ORDER

## 2024-03-16 MED ORDER — HEPARIN SOD (PORK) LOCK FLUSH 100 UNIT/ML IV SOLN
500.0000 [IU] | Freq: Once | INTRAVENOUS | Status: AC
  Administered 2024-03-16: 500 [IU] via INTRAVENOUS
  Filled 2024-03-16: qty 5

## 2024-03-16 MED ORDER — SODIUM CHLORIDE 0.9% FLUSH
10.0000 mL | Freq: Once | INTRAVENOUS | Status: AC
  Filled 2024-03-16: qty 10

## 2024-03-21 ENCOUNTER — Other Ambulatory Visit: Payer: Self-pay

## 2024-03-21 ENCOUNTER — Other Ambulatory Visit: Payer: Self-pay | Admitting: Oncology

## 2024-03-21 DIAGNOSIS — C50919 Malignant neoplasm of unspecified site of unspecified female breast: Secondary | ICD-10-CM

## 2024-03-21 NOTE — Progress Notes (Signed)
 Specialty Pharmacy Refill Coordination Note  Amber Blevins is a 73 y.o. female contacted today regarding refills of specialty medication(s) Capivasertib  (TRUQAP )   Patient requested Delivery   Delivery date: 03/23/24   Verified address: [address] [facility] [address]   Medication will be filled on 03/22/24. This fill date is pending response to refill request from provider. Patient is aware and if they have not received fill by intended date they must follow up with pharmacy.

## 2024-03-22 ENCOUNTER — Other Ambulatory Visit: Payer: Self-pay

## 2024-03-23 MED ORDER — CAPIVASERTIB 200 MG PO TABS
400.0000 mg | ORAL_TABLET | Freq: Two times a day (BID) | ORAL | 0 refills | Status: DC
Start: 2024-03-23 — End: 2024-04-11
  Filled 2024-03-24: qty 64, 28d supply, fill #0

## 2024-03-24 ENCOUNTER — Other Ambulatory Visit: Payer: Self-pay

## 2024-03-30 ENCOUNTER — Inpatient Hospital Stay: Attending: Oncology

## 2024-03-30 ENCOUNTER — Telehealth: Payer: Self-pay | Admitting: Licensed Clinical Social Worker

## 2024-03-30 ENCOUNTER — Inpatient Hospital Stay

## 2024-03-30 ENCOUNTER — Encounter: Payer: Self-pay | Admitting: Oncology

## 2024-03-30 ENCOUNTER — Encounter: Payer: Self-pay | Admitting: Licensed Clinical Social Worker

## 2024-03-30 ENCOUNTER — Ambulatory Visit

## 2024-03-30 ENCOUNTER — Other Ambulatory Visit

## 2024-03-30 ENCOUNTER — Ambulatory Visit: Payer: Self-pay | Admitting: Licensed Clinical Social Worker

## 2024-03-30 ENCOUNTER — Ambulatory Visit: Admitting: Oncology

## 2024-03-30 ENCOUNTER — Inpatient Hospital Stay: Admitting: Oncology

## 2024-03-30 VITALS — BP 143/77 | HR 86

## 2024-03-30 VITALS — BP 132/75 | HR 54 | Temp 96.0°F | Resp 18 | Wt 189.4 lb

## 2024-03-30 DIAGNOSIS — G620 Drug-induced polyneuropathy: Secondary | ICD-10-CM

## 2024-03-30 DIAGNOSIS — C787 Secondary malignant neoplasm of liver and intrahepatic bile duct: Secondary | ICD-10-CM | POA: Insufficient documentation

## 2024-03-30 DIAGNOSIS — Z1732 Human epidermal growth factor receptor 2 negative status: Secondary | ICD-10-CM | POA: Insufficient documentation

## 2024-03-30 DIAGNOSIS — Z1721 Progesterone receptor positive status: Secondary | ICD-10-CM | POA: Insufficient documentation

## 2024-03-30 DIAGNOSIS — C50911 Malignant neoplasm of unspecified site of right female breast: Secondary | ICD-10-CM | POA: Insufficient documentation

## 2024-03-30 DIAGNOSIS — C7951 Secondary malignant neoplasm of bone: Secondary | ICD-10-CM

## 2024-03-30 DIAGNOSIS — Z1589 Genetic susceptibility to other disease: Secondary | ICD-10-CM | POA: Insufficient documentation

## 2024-03-30 DIAGNOSIS — T451X5A Adverse effect of antineoplastic and immunosuppressive drugs, initial encounter: Secondary | ICD-10-CM

## 2024-03-30 DIAGNOSIS — C50919 Malignant neoplasm of unspecified site of unspecified female breast: Secondary | ICD-10-CM | POA: Diagnosis not present

## 2024-03-30 DIAGNOSIS — C7971 Secondary malignant neoplasm of right adrenal gland: Secondary | ICD-10-CM | POA: Diagnosis not present

## 2024-03-30 DIAGNOSIS — Z5111 Encounter for antineoplastic chemotherapy: Secondary | ICD-10-CM | POA: Diagnosis present

## 2024-03-30 DIAGNOSIS — R7401 Elevation of levels of liver transaminase levels: Secondary | ICD-10-CM

## 2024-03-30 DIAGNOSIS — Z1379 Encounter for other screening for genetic and chromosomal anomalies: Secondary | ICD-10-CM | POA: Insufficient documentation

## 2024-03-30 DIAGNOSIS — C7972 Secondary malignant neoplasm of left adrenal gland: Secondary | ICD-10-CM | POA: Diagnosis not present

## 2024-03-30 DIAGNOSIS — Z170 Estrogen receptor positive status [ER+]: Secondary | ICD-10-CM | POA: Insufficient documentation

## 2024-03-30 DIAGNOSIS — K521 Toxic gastroenteritis and colitis: Secondary | ICD-10-CM

## 2024-03-30 LAB — CMP (CANCER CENTER ONLY)
ALT: 36 U/L (ref 0–44)
AST: 42 U/L — ABNORMAL HIGH (ref 15–41)
Albumin: 3.8 g/dL (ref 3.5–5.0)
Alkaline Phosphatase: 77 U/L (ref 38–126)
Anion gap: 7 (ref 5–15)
BUN: 27 mg/dL — ABNORMAL HIGH (ref 8–23)
CO2: 24 mmol/L (ref 22–32)
Calcium: 8.8 mg/dL — ABNORMAL LOW (ref 8.9–10.3)
Chloride: 106 mmol/L (ref 98–111)
Creatinine: 1.11 mg/dL — ABNORMAL HIGH (ref 0.44–1.00)
GFR, Estimated: 52 mL/min — ABNORMAL LOW (ref >60)
Glucose, Bld: 101 mg/dL — ABNORMAL HIGH (ref 70–99)
Potassium: 3.9 mmol/L (ref 3.5–5.1)
Sodium: 137 mmol/L (ref 135–145)
Total Bilirubin: 0.8 mg/dL (ref 0.0–1.2)
Total Protein: 7 g/dL (ref 6.5–8.1)

## 2024-03-30 LAB — CBC WITH DIFFERENTIAL (CANCER CENTER ONLY)
Abs Immature Granulocytes: 0.13 10*3/uL — ABNORMAL HIGH (ref 0.00–0.07)
Basophils Absolute: 0 10*3/uL (ref 0.0–0.1)
Basophils Relative: 0 %
Eosinophils Absolute: 0.2 10*3/uL (ref 0.0–0.5)
Eosinophils Relative: 3 %
HCT: 36.1 % (ref 36.0–46.0)
Hemoglobin: 11.6 g/dL — ABNORMAL LOW (ref 12.0–15.0)
Immature Granulocytes: 2 %
Lymphocytes Relative: 17 %
Lymphs Abs: 1.3 10*3/uL (ref 0.7–4.0)
MCH: 29.8 pg (ref 26.0–34.0)
MCHC: 32.1 g/dL (ref 30.0–36.0)
MCV: 92.8 fL (ref 80.0–100.0)
Monocytes Absolute: 0.9 10*3/uL (ref 0.1–1.0)
Monocytes Relative: 12 %
Neutro Abs: 5 10*3/uL (ref 1.7–7.7)
Neutrophils Relative %: 66 %
Platelet Count: 193 10*3/uL (ref 150–400)
RBC: 3.89 MIL/uL (ref 3.87–5.11)
RDW: 13.6 % (ref 11.5–15.5)
WBC Count: 7.6 10*3/uL (ref 4.0–10.5)
nRBC: 0 % (ref 0.0–0.2)

## 2024-03-30 MED ORDER — FULVESTRANT 250 MG/5ML IM SOSY
500.0000 mg | PREFILLED_SYRINGE | Freq: Once | INTRAMUSCULAR | Status: AC
Start: 2024-03-30 — End: 2024-03-30
  Administered 2024-03-30: 500 mg via INTRAMUSCULAR
  Filled 2024-03-30: qty 10

## 2024-03-30 MED ORDER — ZOLEDRONIC ACID 4 MG/5ML IV CONC
3.5000 mg | Freq: Once | INTRAVENOUS | Status: AC
  Administered 2024-03-30: 3.5 mg via INTRAVENOUS
  Filled 2024-03-30: qty 4.38

## 2024-03-30 MED ORDER — SODIUM CHLORIDE 0.9 % IV SOLN
INTRAVENOUS | Status: DC
  Filled 2024-03-30: qty 250

## 2024-03-30 MED ORDER — HEPARIN SOD (PORK) LOCK FLUSH 100 UNIT/ML IV SOLN
500.0000 [IU] | Freq: Once | INTRAVENOUS | Status: AC
  Administered 2024-03-30: 500 [IU] via INTRAVENOUS
  Filled 2024-03-30: qty 5

## 2024-03-30 NOTE — Assessment & Plan Note (Signed)
 Stable

## 2024-03-30 NOTE — Progress Notes (Signed)
 Hematology/Oncology Progress note [phone] [phone]     CHIEF COMPLAINTS/REASON FOR VISIT:  metastatic breast caner ASSESSMENT & PLAN:   Cancer Staging  Primary malignant neoplasm of breast with metastasis (HCC) Staging form: Breast, AJCC 8th Edition - Clinical stage from 07/12/2021: Stage IV (rcTX, rcNX, rpM1, ER+, PR+, HER2-) - Signed by Babara Call, MD on 03/08/2022   Primary malignant neoplasm of breast with metastasis (HCC) 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  Labs are reviewed and discussed with patient. Continue fulvestrant  and Capivasertib  [400mg  Q12h on D1-4 of each week].  fulvestrant  monthly.  She tolerates well. Glucose is stable.       Metastasis to bone [facility]) 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 Continue calcium  and vitamin D supplementation. [name]  MRI brain showed enlarging right frontal calvarium - s/p RT  Zometa  monthly now, consider eventually increase interval to Q3 months in the future   Transaminitis Stable.   Encounter for antineoplastic chemotherapy Chemotherapy plan as listed above   Chemotherapy-induced neuropathy (HCC) #Grade 2 neuropathy, continue gabapentin ,  Continue gabapentin  600 mg in a.m., she lower the night time dose to 900 mg  Declined acupuncture    Chemotherapy induced diarrhea At baseline, about once a week, not worse. Imodium PRN       Orders Placed This Encounter  Procedures   Cancer antigen 27.29    Standing  Status:   Future    Number of Occurrences:   1    Expected Date:   03/30/2024    Expiration Date:   06/28/2024   Cancer antigen 15-3    Standing Status:   Future    Number of Occurrences:   1    Expected Date:   03/30/2024    Expiration Date:   06/28/2024    Follow up 4 weeks lab MD  All questions were answered. The patient knows to call the clinic with any problems, questions or concerns.  Call Babara, MD, PhD [facility] Hematology Oncology 03/30/2024      HISTORY OF PRESENTING ILLNESS:   Amber Blevins is a  73 y.o.  female with PMH listed below presents for follow up for treatment of metastatic breast cancer.   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 Additional metastatic involvement T12, L1 through L3.  No primary tumor site identified. Top differential considerations are Metastatic Disease Unknown primary, less likely Lymphoma or Multiple Myeloma.   2. Superimposed lumbar spine degeneration, including degenerative moderate to severe left L3 and L4 nerve  level impingement from disc herniation.      07/02/2021 Imaging   CT chest abdomen pelvis showed innumerable small pulmonary and pleural nodules consistent with diffuse metastasis.  Associated with probable malignant pleural effusion.  Mediastinal and hilar lymphadenopathy consistent with metastatic disease.  Hepatic and  bilateral adrenal gland metastasis.  Diffuse extensive destructive metastatic bone disease involving pelvis.   07/12/2021 Cancer Staging   Staging form: Breast, AJCC 8th Edition - Clinical stage from 07/12/2021: Stage IV (rcTX, cNX, pM1, ER+, PR+, HER2-) - Signed by Babara Call, MD on 03/08/2022 Stage prefix: Recurrence   07/17/2021 Initial Diagnosis   Metastatic breast cancer -history of breast cancer, diagnosed in 2010 Left-sided T2 (2.4cm) N0, ER/PR positive, HER2 negative IDC of the breast, s/p lumpectomy by Dr.Meyer at [facility] Oncotype score of 11 who completed radiation and she finished 10 years of Femara  from 05/2009 and stopped in 2020.  -07/12/2021 patient underwent right thoracentesis.  Cytology was positive for metastatic carcinoma, compatible with breast origin.  ER/PR +, HER2 negative -07/16/2021, patient underwent iliac bone biopsy.  Positive for metastatic carcinoma.   07/21/2021 - 09/16/2021 Chemotherapy   Patient is on Treatment Plan : BREAST Paclitaxel  D1,8,15 q28d     09/04/2021 Imaging   CT chest abdomen pelvis showed improvement of the pulmonary nodularity and thickening in the left and the right lung.  Persistent nodular interstitial and pleural thickening remains.  Improvement of mediastinal and hilar adenopathy, hepatic metastatic lesions are similar.  Adrenal gland metastasis is similar.  Interval increase in the sclerotic bone metastasis.    09/30/2021 -  Chemotherapy   started on abemaciclib  100 mg twice daily and letrozole .   02/24/2022 Imaging    CT chest abdomen pelvis with contrast showed unchanged bilateral pleural effusion with associated intralobular septal thickening of the lung bases.  Without discrete nodularity consistent with stable appearance of treated pleural and lymphangitic metastatic disease.  Stable or minimally diminished subcentimeter liver lesions.  Unchanged left adrenal metastasis.  Unchanged widespread sclerotic osseous metastatic disease involving the  included axial and appendicular skeleton.  No evidence of new metastatic disease in the chest abdomen or pelvis.  Coronary artery disease.   03/06/2022 Procedure   Ultrasound guided thoracentesis, removal of pleural fluid.    05/29/2022 Imaging   CT chest abdomen pelvis 1. Stable CT of the chest, abdomen and pelvis. 2. Unchanged appearance of bilateral pleural effusions, pleural nodularity, and interlobular septal thickening compatible with pleural and lymphangitic metastatic disease. 3. Stable appearance of multifocal sclerotic bone metastases. 4. Multifocal low-attenuation liver lesions are unchanged in the interval. 5. Stable appearance of left adrenal gland metastases. 6. Stable appearance of small pericardial effusion.7. No new or progressive disease identified.8. Aortic Atherosclerosis (ICD10-I70.0).   06/06/2022 Imaging   Bone scan Constellation of findings are consistent with multifocal osseous metastatic disease throughout the axial and appendicular skeleton.Of note, osseous metastatic disease involves the RIGHT greater than LEFT femur. Consider evaluation with dedicated hip and femur radiographs to evaluate for overall disease burden and potential risk for pathologic fracture.   08/31/2022 Imaging   CT chest abdomen pelvis w contrast 1. Unchanged small right, trace left pleural effusions with associated diffuse interlobular septal thickening and fine fissural and perilymphatic nodularity throughout the lungs, findings consistent with lymphangitic metastatic disease. 2. Stable or slightly diminished very subtle hypodense lesion of the central right lobe of the liver, hepatic segment VIII measuring 0.6 cm, previously 0.8 cm. Additional unchanged, very subtle hypodense lesion of hepatic segment IV B  measuring 1.1 cm. 3. Unchanged widespread sclerotic osseous metastatic disease throughout the included axial and proximal appendicular skeleton, particularly dense in the lower lumbar spine  and sacrum. 4. Unchanged left adrenal nodule measuring 1.7 x 1.1 cm. 5. Overall constellation of findings is consistent with stable metastatic disease. 6. Coronary artery disease.    12/01/2022 Miscellaneous   [facility] liquid biopsy - PIK3CA mutation. 0.1% Not enough tissue for tissue NGS   03/08/2023 Imaging   Bone scan showed Multifocal osseous metastatic disease. One new focus along the right side of the skull   08/23/2023 - 09/06/2023 Radiation Therapy   Skull radiation.    12/24/2023 Imaging   CT chest abdomen pelvis wo contrast showed 1. 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 2. New indistinct 0.6 cm medial right lower lobe pulmonary nodule, indeterminate for inflammatory nodule versus pulmonary metastasis. Suggest attention on follow-up chest CT in 3 months. 3. Widespread patchy sclerotic osseous metastatic disease, not appreciably changed. 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 4. Stable small layering right pleural effusion. Stable small pericardial effusion. 5. Three-vessel coronary atherosclerosis. 6. Long-term stability of left adrenal nodule favoring an adenoma. 7. Moderate left colonic diverticulosis. 8.  Aortic Atherosclerosis (ICD10-I70.0).     Procedure   Liver biopsy Pathology showed metasatic adenocarcinoma Immunohistochemical stains show that the tumor cells are positive for CK7 and GATA3 while they are negative for CK20 and CDX2, consistent with patient's clinical history of primary breast carcinoma.     ER 95%, PR 0%, HER2 low (1+), Ki 67 5% NGS showed PIK3CA mutation, CHEK mutation.    02/07/2024 -  Chemotherapy   Started on Capivasertib  400mg  Q12h on D1-4 of each week   Metastasis to bone (HCC)  02/04/2022 Initial Diagnosis   Metastasis to bone [facility])    Patient follows up with cardiology for evaluation of bradycardia.  Cardiology notes reviewed.  72-hour Holter monitor revealed revealed predominant sinus  rhythm with mean heart rate of 96 bpm, heart rate range 53 to 129 bpm, frequent premature ventricular contractions (27% burden), and intermittent atrial fibrillation (5% burden) with longest episode 3 hours. CHA2DS2-VASc score of 3, per cardiology recommendation patient was started on Eliquis 5 mg twice daily.     INTERVAL HISTORY Amber Blevins is a 73 y.o. female who has above history reviewed by me today presents for follow up visit for metastatic breast cancer Occasional back and hip pain..   No Fever or chills. Denies abdominal pain,nausea or vomiting.  Started on Capivasertib  400mg  Q12h on D1-4 of each week. So far she tolerates well. No significant side effects.     .  Review of Systems  Constitutional:  Negative for appetite change, chills, fatigue and fever.  HENT:   Negative for hearing loss and voice change.   Eyes:  Negative for eye problems.  Respiratory:  Negative for chest tightness, cough and shortness of breath.   Cardiovascular:  Negative for chest pain.  Gastrointestinal:  Negative for abdominal distention, abdominal pain and blood in stool.  Endocrine: Negative for hot flashes.  Genitourinary:  Negative for difficulty urinating and frequency.   Musculoskeletal:  Negative for arthralgias.  Skin:  Negative for itching and rash.  Neurological:  Positive for numbness. Negative for extremity weakness.  Hematological:  Negative for adenopathy.  Psychiatric/Behavioral:  Negative for confusion.      MEDICAL HISTORY:  Past Medical History:  Diagnosis Date   A-fib [facility])    Breast cancer (HCC)    COPD (  chronic obstructive pulmonary disease) (HCC)    Family history of adverse reaction to anesthesia    brother has problem with coming out of anesthesia   High cholesterol    Hypertension    Personal history of radiation therapy    Pre-diabetes     SURGICAL HISTORY: Past Surgical History:  Procedure Laterality Date   BREAST LUMPECTOMY Left    2010    COLONOSCOPY     EYE SURGERY Left    Cataract surgery   PORTACATH PLACEMENT N/A 08/01/2021   Procedure: INSERTION PORT-A-CATH;  Surgeon: Rodolph Romano, MD;  Location: [facility] ORS;  Service: General;  Laterality: N/A;   thorocentesis Right 07/10/2021   and another on 07/25/21    SOCIAL HISTORY: Social History   Socioeconomic History   Marital status: Married    Spouse name: Not on file   Number of children: Not on file   Years of education: Not on file   Highest education level: Not on file  Occupational History   Not on file  Tobacco Use   Smoking status: Former    Current packs/day: 0.00    Average packs/day: 1 pack/day for 25.0 years (25.0 ttl pk-yrs)    Types: Cigarettes    Start date: 20    Quit date: 65    Years since quitting: 35.5   Smokeless tobacco: Never  Vaping Use   Vaping status: Never Used  Substance and Sexual Activity   Alcohol use: Not Currently   Drug use: Never   Sexual activity: Not on file  Other Topics Concern   Not on file  Social History Narrative   Not on file   Social Drivers of Health   Financial Resource Strain: Low Risk  (07/26/2023)   Received from [facility]   Overall Financial Resource Strain (CARDIA)    Difficulty of Paying Living Expenses: Not hard at all  Food Insecurity: No Food Insecurity (07/26/2023)   Received from [facility]   Hunger Vital Sign    Within the past 12 months, you worried that your food would run out before you got the money to buy more.: Never true    Within the past 12 months, the food you bought just didn't last and you didn't have money to get more.: Never true  Transportation Needs: No Transportation Needs (07/26/2023)   Received from [facility] - Transportation    In the past 12 months, has lack of transportation kept you from medical appointments or from getting medications?: No    Lack of Transportation (Non-Medical): No   Physical Activity: Not on file  Stress: Not on file  Social Connections: Not on file  Intimate Partner Violence: Not on file    FAMILY HISTORY: Family History  Problem Relation Age of Onset   Ovarian cancer Mother 56   Lung cancer Mother 20   Diabetes Father    Heart disease Father    Parkinson's disease Father    Bladder Cancer Brother 29   Pulmonary disease Brother    Rheumatic fever Brother    Prostate cancer Cousin 55   Prostate cancer Cousin 3   Breast cancer Neg Hx     ALLERGIES:  is allergic to green tea (camellia sinensis), melaleuca viridiflora, tea tree oil, and lisinopril.  MEDICATIONS:  Current Outpatient Medications  Medication Sig Dispense Refill   acetaminophen  (TYLENOL ) 650 MG CR tablet Take 650 mg by mouth every 8 (eight) hours as needed for  pain.     Boswellia-Glucosamine-Vit D (OSTEO BI-FLEX-GLUCOS/5-LOXIN) TABS Take 1 capsule by mouth in the morning and at bedtime.     Calcium  Carb-Cholecalciferol 600-400 MG-UNIT TABS Take 1 tablet by mouth 2 (two) times daily.     capivasertib  (TRUQAP ) 200 MG tablet Take 2 tablets (400 mg total) by mouth 2 (two) times daily. Take for 4 days, then hold for 3 days. Repeat every 7 days. 64 tablet 0   cetirizine (ZYRTEC) 10 MG chewable tablet Chew 10 mg by mouth daily as needed for allergies.     cyanocobalamin 1000 MCG tablet Take 1,000 mcg by mouth daily.     diphenoxylate -atropine  (LOMOTIL ) 2.5-0.025 MG tablet Take 1 tablet by mouth 4 (four) times daily as needed for diarrhea or loose stools. 30 tablet 0   Docusate Sodium (DSS) 100 MG CAPS Take 1 capsule by mouth daily.     ELIQUIS 5 MG TABS tablet Take 5 mg by mouth 2 (two) times daily.     gabapentin  (NEURONTIN ) 300 MG capsule TAKE 2 CAPSULES BY MOUTH IN THE MORNING AND 3 CAPSULES IN THE EVENING 450 capsule 0   Iron-Vitamin C (VITRON-C) 65-125 MG TABS Take 1 tablet by mouth daily.     lidocaine -prilocaine  (EMLA ) cream Apply small amount to port and cover with saran wrap  1-2 hours prior to port access 30 g 5   Loperamide HCl (IMODIUM PO) Take by mouth.     losartan (COZAAR) 50 MG tablet Take 50 mg by mouth daily.     magnesium chloride (SLOW-MAG) 64 MG TBEC SR tablet Take 1 tablet by mouth at bedtime.     melatonin (MELATONIN MAXIMUM STRENGTH) 5 MG TABS Take 1 tablet by mouth at bedtime as needed.     ondansetron  (ZOFRAN ) 8 MG tablet Take 1 tablet (8 mg total) by mouth every 8 (eight) hours as needed for nausea or vomiting. 20 tablet 1   Turmeric (QC TUMERIC COMPLEX PO) Take 1 capsule by mouth at bedtime.     atorvastatin  (LIPITOR) 40 MG tablet Take 40 mg by mouth at bedtime. (Patient not taking: Reported on 03/30/2024)     No current facility-administered medications for this visit.   Facility-Administered Medications Ordered in Other Visits  Medication Dose Route Frequency Provider Last Rate Last Admin   0.9 %  sodium chloride  infusion   Intravenous Continuous Babara Call, MD   Stopped at 03/30/24 1059   sodium chloride  flush (NS) 0.9 % injection 10 mL  10 mL Intravenous Once Babara Call, MD         PHYSICAL EXAMINATION: ECOG PERFORMANCE STATUS: 1 - Symptomatic but completely ambulatory Vitals:   03/30/24 0857  BP: 132/75  Pulse: (!) 54  Resp: 18  Temp: (!) 96 F (35.6 C)  SpO2: 100%   Filed Weights   03/30/24 0857  Weight: 189 lb 6.4 oz (85.9 kg)    Physical Exam Constitutional:      General: She is not in acute distress.    Appearance: She is not diaphoretic.  HENT:     Head: Normocephalic and atraumatic.     Nose: Nose normal.     Mouth/Throat:     Pharynx: No oropharyngeal exudate.  Eyes:     General: No scleral icterus.    Pupils: Pupils are equal, round, and reactive to light.  Cardiovascular:     Rate and Rhythm: Normal rate and regular rhythm.     Heart sounds: No murmur heard. Pulmonary:     Effort:  Pulmonary effort is normal. No respiratory distress.     Comments: Mild decreased breath sound bilaterally.. L>R Abdominal:      General: There is no distension.     Palpations: Abdomen is soft.     Tenderness: There is no abdominal tenderness.  Musculoskeletal:        General: Normal range of motion.     Cervical back: Normal range of motion and neck supple.  Skin:    General: Skin is warm and dry.     Findings: No erythema.  Neurological:     Mental Status: She is alert and oriented to person, place, and time.     Cranial Nerves: No cranial nerve deficit.     Motor: No abnormal muscle tone.     Coordination: Coordination normal.  Psychiatric:        Mood and Affect: Affect normal.       LABORATORY DATA:  I have reviewed the data as listed     Latest Ref Rng & Units 03/30/2024    8:40 AM 03/02/2024    8:03 AM 02/17/2024    8:19 AM  CBC  WBC 4.0 - 10.5 K/uL 7.6  8.0  7.2   Hemoglobin 12.0 - 15.0 g/dL 88.3  87.8  88.4   Hematocrit 36.0 - 46.0 % 36.1  37.2  34.7   Platelets 150 - 400 K/uL 193  219  239       Latest Ref Rng & Units 03/30/2024    8:40 AM 03/16/2024    7:51 AM 03/02/2024    8:03 AM  CMP  Glucose 70 - 99 mg/dL 898  890  893   BUN 8 - 23 mg/dL 27  23  24    Creatinine 0.44 - 1.00 mg/dL 8.88  8.77  8.81   Sodium 135 - 145 mmol/L 137  141  137   Potassium 3.5 - 5.1 mmol/L 3.9  3.7  3.8   Chloride 98 - 111 mmol/L 106  107  104   CO2 22 - 32 mmol/L 24  25  24    Calcium  8.9 - 10.3 mg/dL 8.8  9.3  8.9   Total Protein 6.5 - 8.1 g/dL 7.0   6.9   Total Bilirubin 0.0 - 1.2 mg/dL 0.8   1.1   Alkaline Phos 38 - 126 U/L 77   87   AST 15 - 41 U/L 42   42   ALT 0 - 44 U/L 36   44      RADIOGRAPHIC STUDIES: I have personally reviewed the radiological images as listed and agreed with the findings in the report. MR Brain W Wo Contrast Result Date: 01/27/2024 EXAM: MRI BRAIN WITH AND WITHOUT CONTRAST 01/21/2024 10:07:58 AM TECHNIQUE: Multiplanar multisequence MRI of the head/brain was performed with and without the administration of intravenous contrast. COMPARISON: 07/16/2023 CLINICAL HISTORY: Breast  cancer. History of breast cancer with skull metastasis. History of radiation treatment. Evaluation for changes. No symptoms. FINDINGS: INTRACRANIAL STRUCTURES AND VENTRICLES: There is no acute infarct. No mass effect or midline shift. No evidence of an acute intracranial hemorrhage. The ventricles and sulci are normal in size and configuration. The sellar/suprasellar regions appear unremarkable. The normal signal voids within the major intracranial vessels appear maintained. No abnormal focus of enhancement is seen within the brain. Unchanged background of mild chronic small vessel disease. ORBITS: The visualized portion of the orbits demonstrate no acute abnormality. SINUSES: The visualized paranasal sinuses and mastoid air cells demonstrate no acute abnormality. BONES  AND SOFT TISSUES: Slightly increased size of the enhancing skull lesion along the right coronal suture now measuring up to 21 mm (axial 131 series 18), previously 17 mm. No new enhancing lesions in the skull. IMPRESSION: 1. Slightly increased size of the enhancing skull lesion along the right coronal suture, now measuring up to 21 mm, previously 17 mm. 2. No new enhancing lesions in the skull. 3. No foci of abnormal enhancement in the brain. Electronically signed by: ryan chess 01/27/2024 07:49 PM EDT RP Workstation: [id number]   NM Cardiac Muga Rest Result Date: 01/21/2024 CLINICAL DATA:  Breast cancer. Evaluate cardiac function in relation to chemotherapy. EXAM: NUCLEAR MEDICINE CARDIAC BLOOD POOL IMAGING (MUGA) TECHNIQUE: Cardiac multi-gated acquisition was performed at rest following intravenous injection of Tc-11m labeled red blood cells. RADIOPHARMACEUTICALS:  21.7 mCi Tc-65m pertechnetate in-vitro labeled red blood cells IV COMPARISON:  None Available. FINDINGS: No  focal wall motion abnormality of the left ventricle. Calculated left ventricular ejection fraction equals 54.5% IMPRESSION: Left ventricular ejection fraction equals54.5 %.  Electronically Signed   By: Jackquline Boxer M.D.   On: 01/21/2024 13:09   US  BIOPSY (LIVER) Result Date: 01/06/2024 INDICATION: 73 year old female referred for biopsy of liver mass EXAM: ULTRASOUND-GUIDED LIVER MASS BIOPSY MEDICATIONS: None. ANESTHESIA/SEDATION: Moderate (conscious) sedation was employed during this procedure. A total of Versed  1.0 mg and Fentanyl  50 mcg was administered intravenously by the radiology nurse. Total intra-service moderate Sedation Time: 13 minutes. The patient's level of consciousness and vital signs were monitored continuously by radiology nursing throughout the procedure under my direct supervision. COMPLICATIONS: None PROCEDURE: Informed written consent was obtained from the patient after a thorough discussion of the procedural risks, benefits and alternatives. All questions were addressed. Maximal Sterile Barrier Technique was utilized including caps, mask, sterile gowns, sterile gloves, sterile drape, hand hygiene and skin antiseptic. A timeout was performed prior to the initiation of the procedure. Ultrasound survey of the right liver lobe performed with images stored and sent to PACs. The right lower thorax/right upper abdomen was prepped with chlorhexidine  in a sterile fashion, and a sterile drape was applied covering the operative field. A sterile gown and sterile gloves were used for the procedure. Local anesthesia was provided with 1% Lidocaine . The patient was prepped and draped sterilely and the skin and subcutaneous tissues were generously infiltrated with 1% lidocaine . A 17 gauge introducer needle was then advanced under ultrasound guidance in an intercostal location into the right liver lobe, targeting heterogeneously of coag asses the right liver. The stylet was removed, and multiple separate 18 gauge core biopsy were retrieved. Samples were placed into formalin for transportation to the lab. Gel-Foam slurry then infused with a small amount of saline for  assistance with hemostasis. The needle was removed, and a final ultrasound image was performed. The patient tolerated the procedure well and remained hemodynamically stable throughout. No complications were encountered and no significant blood loss was encounter. IMPRESSION: Status post ultrasound-guided biopsy of right liver mass. Signed, Ami RAMAN. Alona ROSALEA GRAVER, RPVI [facility] Electronically Signed   By: Ami Alona D.O.   On: 01/06/2024 12:45

## 2024-03-30 NOTE — Assessment & Plan Note (Addendum)
 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  Labs are reviewed and discussed with patient. Continue fulvestrant  and Capivasertib  [400mg  Q12h on D1-4 of each week].  fulvestrant  monthly.  She tolerates well. Glucose is stable.

## 2024-03-30 NOTE — Assessment & Plan Note (Signed)
#  Grade 2 neuropathy, continue gabapentin,  Continue gabapentin 600 mg in a.m., she lower the night time dose to 900 mg  Declined acupuncture

## 2024-03-30 NOTE — Telephone Encounter (Signed)
 I contacted Ms. Obie to discuss her genetic testing results. 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 Patient is scheduled to meet with me 04/27/2024 to discuss in detail.    The test report has been scanned into EPIC and is located under the Molecular Pathology section of the Results Review tab.  A portion of the result report is included below for reference.      Dena Cary, MS, [facility] Genetic Counselor [email or web address] Phone: [phone]

## 2024-03-30 NOTE — Assessment & Plan Note (Addendum)
 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 Continue calcium  and vitamin D supplementation. [name]  MRI brain showed enlarging right frontal calvarium - s/p RT  Zometa  monthly now, consider eventually increase interval to Q3 months in the future

## 2024-03-30 NOTE — Assessment & Plan Note (Signed)
 Chemotherapy plan as listed above

## 2024-03-30 NOTE — Assessment & Plan Note (Signed)
 At baseline, about once a week, not worse. Imodium PRN

## 2024-03-31 LAB — CANCER ANTIGEN 27.29: CA 27.29: 29.2 U/mL (ref 0.0–38.6)

## 2024-04-01 LAB — CANCER ANTIGEN 15-3: CA 15-3: 23.3 U/mL (ref 0.0–25.0)

## 2024-04-11 ENCOUNTER — Other Ambulatory Visit: Payer: Self-pay | Admitting: Oncology

## 2024-04-11 ENCOUNTER — Other Ambulatory Visit: Payer: Self-pay

## 2024-04-11 DIAGNOSIS — C50919 Malignant neoplasm of unspecified site of unspecified female breast: Secondary | ICD-10-CM

## 2024-04-13 ENCOUNTER — Other Ambulatory Visit: Payer: Self-pay | Admitting: Oncology

## 2024-04-13 ENCOUNTER — Other Ambulatory Visit: Payer: Self-pay

## 2024-04-13 ENCOUNTER — Other Ambulatory Visit (HOSPITAL_COMMUNITY): Payer: Self-pay

## 2024-04-13 DIAGNOSIS — C50919 Malignant neoplasm of unspecified site of unspecified female breast: Secondary | ICD-10-CM

## 2024-04-13 MED ORDER — CAPIVASERTIB 200 MG PO TABS
400.0000 mg | ORAL_TABLET | Freq: Two times a day (BID) | ORAL | 0 refills | Status: DC
Start: 2024-04-13 — End: 2024-05-11
  Filled 2024-04-13: qty 64, 16d supply, fill #0
  Filled 2024-04-21: qty 64, 28d supply, fill #0

## 2024-04-19 ENCOUNTER — Encounter: Payer: Self-pay | Admitting: Licensed Clinical Social Worker

## 2024-04-20 ENCOUNTER — Encounter: Payer: Self-pay | Admitting: Oncology

## 2024-04-21 ENCOUNTER — Other Ambulatory Visit: Payer: Self-pay

## 2024-04-21 NOTE — Progress Notes (Signed)
 Specialty Pharmacy Refill Coordination Note  Amber Blevins is a 73 y.o. female contacted today regarding refills of specialty medication(s) Capivasertib  (TRUQAP )   Patient requested Delivery   Delivery date: 04/25/24   Verified address: [address] [place] [address]   Medication will be filled on 07.28.25.

## 2024-04-24 ENCOUNTER — Other Ambulatory Visit: Payer: Self-pay

## 2024-04-27 ENCOUNTER — Encounter: Payer: Self-pay | Admitting: Oncology

## 2024-04-27 ENCOUNTER — Inpatient Hospital Stay

## 2024-04-27 ENCOUNTER — Inpatient Hospital Stay: Admitting: Oncology

## 2024-04-27 ENCOUNTER — Inpatient Hospital Stay: Admitting: Licensed Clinical Social Worker

## 2024-04-27 VITALS — BP 152/82 | HR 84 | Temp 97.1°F | Resp 18 | Wt 195.4 lb

## 2024-04-27 DIAGNOSIS — C50919 Malignant neoplasm of unspecified site of unspecified female breast: Secondary | ICD-10-CM

## 2024-04-27 DIAGNOSIS — G620 Drug-induced polyneuropathy: Secondary | ICD-10-CM

## 2024-04-27 DIAGNOSIS — T451X5A Adverse effect of antineoplastic and immunosuppressive drugs, initial encounter: Secondary | ICD-10-CM

## 2024-04-27 DIAGNOSIS — Z1589 Genetic susceptibility to other disease: Secondary | ICD-10-CM

## 2024-04-27 DIAGNOSIS — R7401 Elevation of levels of liver transaminase levels: Secondary | ICD-10-CM

## 2024-04-27 DIAGNOSIS — K521 Toxic gastroenteritis and colitis: Secondary | ICD-10-CM

## 2024-04-27 DIAGNOSIS — Z5111 Encounter for antineoplastic chemotherapy: Secondary | ICD-10-CM

## 2024-04-27 DIAGNOSIS — Z1379 Encounter for other screening for genetic and chromosomal anomalies: Secondary | ICD-10-CM

## 2024-04-27 DIAGNOSIS — C7951 Secondary malignant neoplasm of bone: Secondary | ICD-10-CM

## 2024-04-27 LAB — CBC WITH DIFFERENTIAL/PLATELET
Abs Immature Granulocytes: 0.18 K/uL — ABNORMAL HIGH (ref 0.00–0.07)
Basophils Absolute: 0 K/uL (ref 0.0–0.1)
Basophils Relative: 1 %
Eosinophils Absolute: 0.3 K/uL (ref 0.0–0.5)
Eosinophils Relative: 4 %
HCT: 35 % — ABNORMAL LOW (ref 36.0–46.0)
Hemoglobin: 11.4 g/dL — ABNORMAL LOW (ref 12.0–15.0)
Immature Granulocytes: 2 %
Lymphocytes Relative: 15 %
Lymphs Abs: 1.3 K/uL (ref 0.7–4.0)
MCH: 29.6 pg (ref 26.0–34.0)
MCHC: 32.6 g/dL (ref 30.0–36.0)
MCV: 90.9 fL (ref 80.0–100.0)
Monocytes Absolute: 0.8 K/uL (ref 0.1–1.0)
Monocytes Relative: 9 %
Neutro Abs: 6.1 K/uL (ref 1.7–7.7)
Neutrophils Relative %: 69 %
Platelets: 240 K/uL (ref 150–400)
RBC: 3.85 MIL/uL — ABNORMAL LOW (ref 3.87–5.11)
RDW: 14 % (ref 11.5–15.5)
WBC: 8.7 K/uL (ref 4.0–10.5)
nRBC: 0 % (ref 0.0–0.2)

## 2024-04-27 LAB — COMPREHENSIVE METABOLIC PANEL WITH GFR
ALT: 19 U/L (ref 0–44)
AST: 23 U/L (ref 15–41)
Albumin: 3.8 g/dL (ref 3.5–5.0)
Alkaline Phosphatase: 79 U/L (ref 38–126)
Anion gap: 6 (ref 5–15)
BUN: 20 mg/dL (ref 8–23)
CO2: 25 mmol/L (ref 22–32)
Calcium: 8.8 mg/dL — ABNORMAL LOW (ref 8.9–10.3)
Chloride: 104 mmol/L (ref 98–111)
Creatinine, Ser: 1.14 mg/dL — ABNORMAL HIGH (ref 0.44–1.00)
GFR, Estimated: 51 mL/min — ABNORMAL LOW (ref >60)
Glucose, Bld: 145 mg/dL — ABNORMAL HIGH (ref 70–99)
Potassium: 3.9 mmol/L (ref 3.5–5.1)
Sodium: 135 mmol/L (ref 135–145)
Total Bilirubin: 0.7 mg/dL (ref 0.0–1.2)
Total Protein: 6.8 g/dL (ref 6.5–8.1)

## 2024-04-27 MED ORDER — FULVESTRANT 250 MG/5ML IM SOSY
500.0000 mg | PREFILLED_SYRINGE | Freq: Once | INTRAMUSCULAR | Status: AC
Start: 2024-04-27 — End: 2024-04-27
  Administered 2024-04-27: 500 mg via INTRAMUSCULAR
  Filled 2024-04-27: qty 10

## 2024-04-27 MED ORDER — ZOLEDRONIC ACID 4 MG/100ML IV SOLN
4.0000 mg | Freq: Once | INTRAVENOUS | Status: AC
  Administered 2024-04-27: 4 mg via INTRAVENOUS
  Filled 2024-04-27: qty 100

## 2024-04-27 NOTE — Assessment & Plan Note (Signed)
 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 Continue calcium  and vitamin D supplementation. [name]  MRI brain showed enlarging right frontal calvarium - s/p RT  Zometa  monthly now, consider eventually increase interval to Q3 months in the future

## 2024-04-27 NOTE — Assessment & Plan Note (Signed)
#  Grade 2 neuropathy, continue gabapentin,  Continue gabapentin 600 mg in a.m., she lower the night time dose to 900 mg  Declined acupuncture

## 2024-04-27 NOTE — Progress Notes (Signed)
 Pt here for follow up. No new concerns voiced.

## 2024-04-27 NOTE — Assessment & Plan Note (Signed)
 Chemotherapy plan as listed above

## 2024-04-27 NOTE — Patient Instructions (Signed)

## 2024-04-27 NOTE — Assessment & Plan Note (Addendum)
 Stable symptoms Imodium PRN

## 2024-04-27 NOTE — Progress Notes (Signed)
 Genetic Test Results - CHEK2+  HPI:   Ms. Shiffer was previously seen in the [facility] due to a personal and family history of cancer and concerns regarding a hereditary predisposition to cancer. Please refer to our prior cancer genetics clinic note for more information regarding our discussion, assessment and recommendations, at the time. Ms. Crafton recent genetic test results were disclosed to her, as were recommendations warranted by these results. These results and recommendations are discussed in more detail below.  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 Additional metastatic involvement T12, L1 through L3.  No primary tumor site identified. Top differential considerations are Metastatic Disease Unknown primary, less likely Lymphoma or Multiple Myeloma.   2. Superimposed lumbar spine degeneration, including degenerative moderate to severe left L3 and L4 nerve level impingement from disc herniation.      07/02/2021 Imaging   CT chest abdomen pelvis showed innumerable small pulmonary and pleural nodules consistent with diffuse metastasis.  Associated with probable malignant pleural effusion.  Mediastinal and hilar lymphadenopathy consistent with metastatic disease.  Hepatic and bilateral adrenal gland metastasis.  Diffuse extensive  destructive metastatic bone disease involving pelvis.   07/12/2021 Cancer Staging   Staging form: Breast, AJCC 8th Edition - Clinical stage from 07/12/2021: Stage IV (rcTX, cNX, pM1, ER+, PR+, HER2-) - Signed by Babara Call, MD on 03/08/2022 Stage prefix: Recurrence   07/17/2021 Initial Diagnosis   Metastatic breast cancer -history of breast cancer, diagnosed in 2010 Left-sided T2 (2.4cm) N0, ER/PR positive, HER2 negative IDC of the breast, s/p lumpectomy by Dr[facility]Meyer [facility]. Oncotype score of 11 who completed radiation and she finished 10 years of Femara  from 05/2009 and stopped in 2020.  -07/12/2021 patient underwent right thoracentesis.  Cytology was positive for metastatic carcinoma, compatible with breast origin.  ER/PR +, HER2 negative -07/16/2021, patient underwent iliac bone biopsy.  Positive for metastatic carcinoma.   07/21/2021 - 09/16/2021 Chemotherapy   Patient is on Treatment Plan : BREAST Paclitaxel  D1,8,15 q28d     09/04/2021 Imaging   CT chest abdomen pelvis showed improvement of the pulmonary nodularity and thickening in the left and the right lung.  Persistent nodular interstitial and pleural thickening remains.  Improvement of mediastinal and hilar adenopathy, hepatic metastatic lesions are similar.  Adrenal gland metastasis is similar.  Interval increase in the sclerotic bone metastasis.    09/30/2021 -  Chemotherapy   started on abemaciclib  100 mg twice daily and letrozole .   02/24/2022 Imaging    CT chest abdomen pelvis with contrast showed unchanged bilateral pleural effusion with associated intralobular septal thickening of the lung bases.  Without discrete nodularity consistent with stable appearance of treated pleural and lymphangitic metastatic disease.  Stable or minimally diminished subcentimeter liver lesions.  Unchanged left adrenal metastasis.  Unchanged widespread sclerotic osseous metastatic disease involving the included axial and appendicular skeleton.  No evidence  of new metastatic  disease in the chest abdomen or pelvis.  Coronary artery disease.   03/06/2022 Procedure   Ultrasound guided thoracentesis, removal of pleural fluid.    05/29/2022 Imaging   CT chest abdomen pelvis 1. Stable CT of the chest, abdomen and pelvis. 2. Unchanged appearance of bilateral pleural effusions, pleural nodularity, and interlobular septal thickening compatible with pleural and lymphangitic metastatic disease. 3. Stable appearance of multifocal sclerotic bone metastases. 4. Multifocal low-attenuation liver lesions are unchanged in the interval. 5. Stable appearance of left adrenal gland metastases. 6. Stable appearance of small pericardial effusion.7. No new or progressive disease identified.8. Aortic Atherosclerosis (ICD10-I70.0).   06/06/2022 Imaging   Bone scan Constellation of findings are consistent with multifocal osseous metastatic disease throughout the axial and appendicular skeleton.Of note, osseous metastatic disease involves the RIGHT greater than LEFT femur. Consider evaluation with dedicated hip and femur radiographs to evaluate for overall disease burden and potential risk for pathologic fracture.   08/31/2022 Imaging   CT chest abdomen pelvis w contrast 1. Unchanged small right, trace left pleural effusions with associated diffuse interlobular septal thickening and fine fissural and perilymphatic nodularity throughout the lungs, findings consistent with lymphangitic metastatic disease. 2. Stable or slightly diminished very subtle hypodense lesion of the central right lobe of the liver, hepatic segment VIII measuring 0.6 cm, previously 0.8 cm. Additional unchanged, very subtle hypodense lesion of hepatic segment IV B measuring 1.1 cm. 3. Unchanged widespread sclerotic osseous metastatic disease throughout the included axial and proximal appendicular skeleton, particularly dense in the lower lumbar spine and sacrum. 4. Unchanged left adrenal nodule  measuring 1.7 x 1.1 cm. 5. Overall constellation of findings is consistent with stable metastatic disease. 6. Coronary artery disease.    12/01/2022 Miscellaneous   [facility] liquid biopsy - PIK3CA mutation. 0.1% Not enough tissue for tissue NGS   03/08/2023 Imaging   Bone scan showed Multifocal osseous metastatic disease. One new focus along the right side of the skull   08/23/2023 - 09/06/2023 Radiation Therapy   Skull radiation.    12/24/2023 Imaging   CT chest abdomen pelvis wo contrast showed 1. 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 2. New indistinct 0.6 cm medial right lower lobe pulmonary nodule, indeterminate for inflammatory nodule versus pulmonary metastasis. Suggest attention on follow-up chest CT in 3 months. 3. Widespread patchy sclerotic osseous metastatic disease, not appreciably changed. 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 4. Stable small layering right pleural effusion. Stable small pericardial effusion. 5. Three-vessel coronary atherosclerosis. 6. Long-term stability of left adrenal nodule favoring an adenoma. 7. Moderate left colonic diverticulosis. 8.  Aortic Atherosclerosis (ICD10-I70.0).     Procedure   Liver biopsy Pathology showed metasatic adenocarcinoma Immunohistochemical stains show that the tumor cells are positive for CK7 and GATA3 while they are negative for CK20 and CDX2, consistent with patient's clinical history of primary breast carcinoma.     ER 95%, PR 0%, HER2 low (1+), Ki 67 5% NGS showed PIK3CA mutation, CHEK mutation.    02/07/2024 -  Chemotherapy   Started on Capivasertib  400mg  Q12h on D1-4 of each week   Metastasis to bone (HCC)  02/04/2022 Initial Diagnosis   Metastasis to bone [facility])     FAMILY HISTORY:  We obtained a detailed, 4-generation family history.  Significant diagnoses are listed below: Family History  Problem Relation Age of Onset   Ovarian cancer Mother 3   Lung  cancer Mother 24   Diabetes Father    Heart disease Father  Parkinson's disease Father    Bladder Cancer Brother 30   Pulmonary disease Brother    Rheumatic fever Brother    Prostate cancer Cousin 53   Prostate cancer Cousin 21   Breast cancer Neg Hx    Ms. Lorence has 1 daughter, 64. She has 5 brothers and 2 sisters. One brother had bladder cancer at 88 and is living at 22.   Ms. Bauserman's mother had ovarian cancer (or possibly another cancer of GYN origin) at 99 and lung cancer at 47, she passed at 59. No other known cancers on mom's side of the family.   Ms. Salo's father passed at 43. Patient has 3 paternal cousins that had cancer, they were brothers. One had an unknown cancer, the other two had prostate cancer at 22 and 51.    Ms. Fini is unaware of previous family history of genetic testing for hereditary cancer risks. There is no reported Ashkenazi Jewish ancestry. There is no known consanguinity.      GENETIC TEST RESULTS:  Ms. Lisle tested positive for a single pathogenic variant (harmful genetic change) in the CHEK2 gene. Specifically, this variant is c.110delC. The remainder of testing was negative/normal.   The [facility] CancerNext+RNAinsight Panel includes sequencing, rearrangement analysis, and RNA analysis for the following 40 genes: APC, ATM, BAP1, BARD1, BMPR1A, BRCA1, BRCA2, BRIP1, CDH1, CDKN2A, CHEK2, FH, FLCN, MET, MLH1, MSH2, MSH6, MUTYH, NF1, NTHL1, PALB2, PMS2, PTEN, RAD51C, RAD51D, RPS20, SMAD4, STK11, TP53, TSC1, TSC2, and VHL (sequencing and deletion/duplication); AXIN2, HOXB13, MBD4, MSH3, POLD1 and POLE (sequencing only); EPCAM and GREM1 (deletion/duplication only).  The test report has been scanned into EPIC and is located under the Molecular Pathology section of the Results Review tab.  A portion of the result report is included below for reference. Genetic testing reported out on 03/30/2024.     Cancer Risks for CHEK2: Females have a 23-27%  lifetime risk of breast cancer. For females with a history of breast cancer: 10 year cumulative risk for contralateral breast cancer is 6-8%. Males are thought to be at an increased risk of prostate cancer. The exact risk figure is unknown at this time. No increased risk for colon cancer based on mutation alone.  Research is continuing to help learn more about the cancers associated with CHEK2 pathogenic variants and what the exact risks are to develop these cancers.  Management Recommendations:  Breast Cancer Screening/Risk Reduction: Females: Breast cancer screening includes: Breast awareness beginning at age 1 Monthly self-breast examination beginning at age 44 Clinical breast examination every 6-12 months beginning at age 35 or at the age of the earliest diagnosed breast cancer in the family, if onset was before age 39 Annual mammogram with consideration of tomosynthesis starting at age 28 or 10 years prior to the youngest age of diagnosis, whichever comes first Consider breast MRI with contrast starting at age 54-35 Evidence is insufficient for a prophylactic risk-reducing mastectomy, manage based on family history   Colon Cancer Screening: General population screening is appropriate Manage based on personal and family history   Prostate Cancer Screening: Consider prostate cancer screening beginning at age 60   This information is based on current understanding of the gene and may change in the future.   Implications for Family Members: Hereditary predisposition to cancer due to pathogenic variants in the CHEK2 gene has autosomal dominant inheritance. This means that an individual with a pathogenic variant has a 50% chance of passing the condition on to his/her offspring. Identification of a pathogenic  variant allows for the recognition of at-risk relatives who can pursue testing for the familial variant.   Family members are encouraged to consider genetic testing for this  familial pathogenic variant. As there are generally no childhood cancer risks associated with pathogenic variants in the CHEK2 gene, individuals in the family are not recommended to have testing until they reach at least 74 years of age. Complimentary testing for the familial variant is available for 90 days. They may contact our office at [phone] for more information or to schedule an appointment. Family members who live outside of the area are encouraged to find a genetic counselor in their area by visiting: [email or web address].   Resources: FORCE (Facing Our Risk of Cancer Empowered) is a resource for those with a hereditary predisposition to develop cancer.  FORCE provides information about risk reduction, advocacy, legislation, and clinical trials.  Additionally, FORCE provides a platform for collaboration and support; which includes: peer navigation, message boards, local support groups, a toll-free helpline, research registry and recruitment, advocate training, published medical research, webinars, brochures, mastectomy photos, and more.  For more information, visit www.facingourrisk.org   PLAN: 1. These results will be made available to  Dr. Babara. She would like Dr. Babara to follow her long-term for this indication.   2. Ms. Selinger plans to discuss these results with her family and will reach out to us  if we can be of any assistance in coordinating genetic testing for any of her relatives.     We encouraged Ms. Birks to remain in contact with us  on an annual basis so we can update her personal and family histories, and let her know of advances in cancer genetics that may benefit the family. Our contact number was provided. Ms. Malesky questions were answered to her satisfaction today, and she knows she is welcome to call anytime with additional questions.   Dena Cary, MS, [facility] Genetic Counselor [email or web address] Phone: [phone]

## 2024-04-27 NOTE — Assessment & Plan Note (Signed)
 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  Labs are reviewed and discussed with patient. Continue fulvestrant  and Capivasertib  [400mg  Q12h on D1-4 of each week].  fulvestrant  monthly.  She tolerates well. Glucose is stable.

## 2024-04-27 NOTE — Progress Notes (Signed)
 Hematology/Oncology Progress note Telephone:([phone]) [phone] Fax:(336) [phone]     CHIEF COMPLAINTS/REASON FOR VISIT:  metastatic breast caner ASSESSMENT & PLAN:   Cancer Staging  Primary malignant neoplasm of breast with metastasis (HCC) Staging form: Breast, AJCC 8th Edition - Clinical stage from 07/12/2021: Stage IV (rcTX, rcNX, rpM1, ER+, PR+, HER2-) - Signed by Babara Call, MD on 03/08/2022   Primary malignant neoplasm of breast with metastasis (HCC) 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  Labs are reviewed and discussed with patient. Continue fulvestrant  and Capivasertib  [400mg  Q12h on D1-4 of each week].  fulvestrant  monthly.  She tolerates well. Glucose is stable.       Chemotherapy induced diarrhea Stable symptoms Imodium PRN  Chemotherapy-induced neuropathy (HCC) #Grade 2 neuropathy, continue gabapentin ,  Continue gabapentin  600 mg in a.m., she lower the night time dose to 900 mg  Declined acupuncture    Encounter for antineoplastic chemotherapy Chemotherapy plan as listed above   Metastasis to bone [facility] [place]) 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 Continue calcium  and vitamin D supplementation. [name]  MRI brain showed enlarging right frontal calvarium - s/p RT  Zometa  monthly now, consider eventually increase interval to Q3 months in the future        Orders Placed This Encounter  Procedures   CT CHEST ABDOMEN PELVIS WO CONTRAST    Standing Status:   Future    Expected Date:    05/18/2024    Expiration Date:   04/27/2025    Preferred imaging location?:   [facility]    If indicated for the ordered procedure, I authorize the administration of oral contrast media per Radiology protocol:   Yes    Does the patient have a contrast media/X-ray dye allergy?:   No   Cancer antigen 15-3    Standing Status:   Future    Expected Date:   05/25/2024    Expiration Date:   08/23/2024   Cancer antigen 27.29    Standing Status:   Future    Expected Date:   05/25/2024    Expiration Date:   08/23/2024   CBC with Differential (Cancer Center Only)    Standing Status:   Future    Expected Date:   05/25/2024    Expiration Date:   08/23/2024   CMP (Cancer Center only)    Standing Status:   Future    Expected Date:   05/25/2024    Expiration Date:   08/23/2024    Follow up 4 weeks lab MD  All questions were answered. The patient knows to call the clinic with any problems, questions or concerns.  Call Babara, MD, PhD [facility] Hematology Oncology 04/27/2024      HISTORY OF PRESENTING ILLNESS:   Amber Blevins is a  73 y.o.  female with PMH listed below presents for follow up for treatment of metastatic breast cancer.   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 Additional metastatic involvement T12, L1 through L3.  No primary tumor site identified. Top differential  considerations are Metastatic Disease Unknown primary, less likely Lymphoma or Multiple Myeloma.   2. Superimposed lumbar spine degeneration, including degenerative moderate to severe left L3 and L4 nerve level impingement from disc herniation.      07/02/2021 Imaging   CT chest abdomen pelvis showed innumerable small pulmonary and pleural nodules consistent with diffuse metastasis.  Associated with probable malignant pleural effusion.  Mediastinal and hilar lymphadenopathy consistent with metastatic disease.  Hepatic and bilateral adrenal gland metastasis.  Diffuse extensive destructive metastatic bone disease involving pelvis.   07/12/2021 Cancer Staging   Staging form: Breast, AJCC 8th Edition - Clinical stage from 07/12/2021: Stage IV (rcTX, cNX, pM1, ER+, PR+, HER2-) - Signed by Babara Call, MD on 03/08/2022 Stage prefix: Recurrence   07/17/2021 Initial Diagnosis   Metastatic breast cancer -history of breast cancer, diagnosed in 2010 Left-sided T2 (2.4cm) N0, ER/PR positive, HER2 negative IDC of the breast, s/p lumpectomy by Dr.Meyer at [facility]. Oncotype score of 11 who completed radiation and she finished 10 years of Femara  from 05/2009 and stopped in 2020.  -07/12/2021 patient underwent right thoracentesis.  Cytology was positive for metastatic carcinoma, compatible with breast origin.  ER/PR +, HER2 negative -07/16/2021, patient underwent iliac bone biopsy.  Positive for metastatic carcinoma.   07/21/2021 - 09/16/2021 Chemotherapy   Patient is on Treatment Plan : BREAST Paclitaxel  D1,8,15 q28d     09/04/2021 Imaging   CT chest abdomen pelvis showed improvement of the pulmonary nodularity and thickening in the left and the right lung.  Persistent nodular interstitial and pleural thickening remains.  Improvement of mediastinal and hilar adenopathy, hepatic metastatic lesions are similar.  Adrenal gland metastasis is similar.  Interval increase in the sclerotic bone metastasis.    09/30/2021 -   Chemotherapy   started on abemaciclib  100 mg twice daily and letrozole .   02/24/2022 Imaging    CT chest abdomen pelvis with contrast showed unchanged bilateral pleural effusion with associated intralobular septal thickening of the lung bases.  Without discrete nodularity consistent with stable appearance of treated pleural and lymphangitic metastatic disease.  Stable or minimally diminished subcentimeter liver lesions.  Unchanged left adrenal metastasis.  Unchanged widespread sclerotic osseous metastatic disease involving the included axial and appendicular skeleton.  No evidence of new metastatic disease in the chest abdomen or pelvis.  Coronary artery disease.   03/06/2022 Procedure   Ultrasound guided thoracentesis, removal of pleural fluid.    05/29/2022 Imaging   CT chest abdomen pelvis 1. Stable CT of the chest, abdomen and pelvis. 2. Unchanged appearance of bilateral pleural effusions, pleural nodularity, and interlobular septal thickening compatible with pleural and lymphangitic metastatic disease. 3. Stable appearance of multifocal sclerotic bone metastases. 4. Multifocal low-attenuation liver lesions are unchanged in the interval. 5. Stable appearance of left adrenal gland metastases. 6. Stable appearance of small pericardial effusion.7. No new or progressive disease identified.8. Aortic Atherosclerosis (ICD10-I70.0).   06/06/2022 Imaging   Bone scan Constellation of findings are consistent with multifocal osseous metastatic disease throughout the  axial and appendicular skeleton.Of note, osseous metastatic disease involves the RIGHT greater than LEFT femur. Consider evaluation with dedicated hip and femur radiographs to evaluate for overall disease burden and potential risk for pathologic fracture.   08/31/2022 Imaging   CT chest abdomen pelvis w contrast 1. Unchanged small right, trace left pleural effusions with associated diffuse interlobular septal thickening and fine fissural and  perilymphatic nodularity throughout the lungs, findings consistent with lymphangitic metastatic disease. 2. Stable or slightly diminished very subtle hypodense lesion of the central right lobe of the liver, hepatic segment VIII measuring 0.6 cm, previously 0.8 cm. Additional unchanged, very subtle hypodense lesion of hepatic segment IV B measuring 1.1 cm. 3. Unchanged widespread sclerotic osseous metastatic disease throughout the included axial and proximal appendicular skeleton, particularly dense in the lower lumbar spine and sacrum. 4. Unchanged left adrenal nodule measuring 1.7 x 1.1 cm. 5. Overall constellation of findings is consistent with stable metastatic disease. 6. Coronary artery disease.    12/01/2022 Miscellaneous   [facility] liquid biopsy - PIK3CA mutation. 0.1% Not enough tissue for tissue NGS   03/08/2023 Imaging   Bone scan showed Multifocal osseous metastatic disease. One new focus along the right side of the skull   08/23/2023 - 09/06/2023 Radiation Therapy   Skull radiation.    12/24/2023 Imaging   CT chest abdomen pelvis wo contrast showed 1. 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 2. New indistinct 0.6 cm medial right lower lobe pulmonary nodule, indeterminate for inflammatory nodule versus pulmonary metastasis. Suggest attention on follow-up chest CT in 3 months. 3. Widespread patchy sclerotic osseous metastatic disease, not appreciably changed. 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 4. Stable small layering right pleural effusion. Stable small pericardial effusion. 5. Three-vessel coronary atherosclerosis. 6. Long-term stability of left adrenal nodule favoring an adenoma. 7. Moderate left colonic diverticulosis. 8.  Aortic Atherosclerosis (ICD10-I70.0).     Procedure   Liver biopsy Pathology showed metasatic adenocarcinoma Immunohistochemical stains show that the tumor cells are positive for CK7 and GATA3  while they are negative for CK20 and CDX2, consistent with patient's clinical history of primary breast carcinoma.     ER 95%, PR 0%, HER2 low (1+), Ki 67 5% NGS showed PIK3CA mutation, CHEK mutation.    02/07/2024 -  Chemotherapy   Started on Capivasertib  400mg  Q12h on D1-4 of each week   Metastasis to bone (HCC)  02/04/2022 Initial Diagnosis   Metastasis to bone [facility])    Patient follows up with cardiology for evaluation of bradycardia.  Cardiology notes reviewed.  72-hour Holter monitor revealed revealed predominant sinus rhythm with mean heart rate of 96 bpm, heart rate range 53 to 129 bpm, frequent premature ventricular contractions (27% burden), and intermittent atrial fibrillation (5% burden) with longest episode 3 hours. CHA2DS2-VASc score of 3, per cardiology recommendation patient was started on Eliquis 5 mg twice daily.     INTERVAL HISTORY Amber Blevins is a 73 y.o. female who has above history reviewed by me today presents for follow up visit for metastatic breast cancer Occasional back and hip pain..   No Fever or chills. Denies abdominal pain,nausea or vomiting.  Currently on Capivasertib  400mg  Q12h on D1-4 of each week. So far she tolerates well. No significant side effects.    .  Review of Systems  Constitutional:  Negative for appetite change, chills, fatigue and fever.  HENT:   Negative for hearing loss and voice change.   Eyes:  Negative for eye problems.  Respiratory:  Negative for chest tightness, cough and shortness of breath.   Cardiovascular:  Negative for chest pain.  Gastrointestinal:  Negative for abdominal distention, abdominal pain and blood in stool.  Endocrine: Negative for hot flashes.  Genitourinary:  Negative for difficulty urinating and frequency.   Musculoskeletal:  Negative for arthralgias.  Skin:  Negative for itching and rash.  Neurological:  Positive for numbness. Negative for extremity weakness.  Hematological:  Negative for  adenopathy.  Psychiatric/Behavioral:  Negative for confusion.      MEDICAL HISTORY:  Past Medical History:  Diagnosis Date   A-fib (HCC)    Breast cancer (HCC)    COPD (chronic obstructive pulmonary disease) (HCC)    Family history of adverse reaction to anesthesia    brother has problem with coming out of anesthesia   High cholesterol    Hypertension    Personal history of radiation therapy    Pre-diabetes     SURGICAL HISTORY: Past Surgical History:  Procedure Laterality Date   BREAST LUMPECTOMY Left    2010   COLONOSCOPY     EYE SURGERY Left    Cataract surgery   PORTACATH PLACEMENT N/A 08/01/2021   Procedure: INSERTION PORT-A-CATH;  Surgeon: Rodolph Romano, MD;  Location: [facility] ORS;  Service: General;  Laterality: N/A;   thorocentesis Right 07/10/2021   and another on 07/25/21    SOCIAL HISTORY: Social History   Socioeconomic History   Marital status: Married    Spouse name: Not on file   Number of children: Not on file   Years of education: Not on file   Highest education level: Not on file  Occupational History   Not on file  Tobacco Use   Smoking status: Former    Current packs/day: 0.00    Average packs/day: 1 pack/day for 25.0 years (25.0 ttl pk-yrs)    Types: Cigarettes    Start date: 11    Quit date: 81    Years since quitting: 35.6   Smokeless tobacco: Never  Vaping Use   Vaping status: Never Used  Substance and Sexual Activity   Alcohol use: Not Currently   Drug use: Never   Sexual activity: Not on file  Other Topics Concern   Not on file  Social History Narrative   Not on file   Social Drivers of Health   Financial Resource Strain: Low Risk  (07/26/2023)   Received from [facility]   Overall Financial Resource Strain (CARDIA)    Difficulty of Paying Living Expenses: Not hard at all  Food Insecurity: No Food Insecurity (07/26/2023)   Received from [facility]   Hunger Vital Sign     Within the past 12 months, you worried that your food would run out before you got the money to buy more.: Never true    Within the past 12 months, the food you bought just didn't last and you didn't have money to get more.: Never true  Transportation Needs: No Transportation Needs (07/26/2023)   Received from [facility] - Transportation    In the past 12 months, has lack of transportation kept you from medical appointments or from getting medications?: No    Lack of Transportation (Non-Medical): No  Physical Activity: Not on file  Stress: Not on file  Social Connections: Not on file  Intimate Partner Violence: Not on file    FAMILY HISTORY: Family History  Problem Relation Age of Onset   Ovarian cancer  Mother 23   Lung cancer Mother 79   Diabetes Father    Heart disease Father    Parkinson's disease Father    Bladder Cancer Brother 29   Pulmonary disease Brother    Rheumatic fever Brother    Prostate cancer Cousin 43   Prostate cancer Cousin 43   Breast cancer Neg Hx     ALLERGIES:  is allergic to green tea (camellia sinensis), melaleuca viridiflora, tea tree oil, and lisinopril.  MEDICATIONS:  Current Outpatient Medications  Medication Sig Dispense Refill   acetaminophen  (TYLENOL ) 650 MG CR tablet Take 650 mg by mouth every 8 (eight) hours as needed for pain.     atorvastatin  (LIPITOR) 40 MG tablet Take 40 mg by mouth at bedtime. (Patient not taking: Reported on 03/30/2024)     Boswellia-Glucosamine-Vit D (OSTEO BI-FLEX-GLUCOS/5-LOXIN) TABS Take 1 capsule by mouth in the morning and at bedtime.     Calcium  Carb-Cholecalciferol 600-400 MG-UNIT TABS Take 1 tablet by mouth 2 (two) times daily.     capivasertib  (TRUQAP ) 200 MG tablet Take 2 tablets (400 mg total) by mouth 2 (two) times daily. Take for 4 days, then hold for 3 days. Repeat every 7 days. 64 tablet 0   cetirizine (ZYRTEC) 10 MG chewable tablet Chew 10 mg by mouth daily as needed for  allergies.     cyanocobalamin 1000 MCG tablet Take 1,000 mcg by mouth daily.     diphenoxylate -atropine  (LOMOTIL ) 2.5-0.025 MG tablet Take 1 tablet by mouth 4 (four) times daily as needed for diarrhea or loose stools. 30 tablet 0   Docusate Sodium (DSS) 100 MG CAPS Take 1 capsule by mouth daily.     ELIQUIS 5 MG TABS tablet Take 5 mg by mouth 2 (two) times daily.     gabapentin  (NEURONTIN ) 300 MG capsule TAKE 2 CAPSULES BY MOUTH IN THE MORNING AND 3 CAPSULES IN THE EVENING 450 capsule 0   Iron-Vitamin C (VITRON-C) 65-125 MG TABS Take 1 tablet by mouth daily.     lidocaine -prilocaine  (EMLA ) cream Apply small amount to port and cover with saran wrap 1-2 hours prior to port access 30 g 5   Loperamide HCl (IMODIUM PO) Take by mouth.     losartan (COZAAR) 50 MG tablet Take 50 mg by mouth daily.     magnesium chloride (SLOW-MAG) 64 MG TBEC SR tablet Take 1 tablet by mouth at bedtime.     melatonin (MELATONIN MAXIMUM STRENGTH) 5 MG TABS Take 1 tablet by mouth at bedtime as needed.     ondansetron  (ZOFRAN ) 8 MG tablet Take 1 tablet (8 mg total) by mouth every 8 (eight) hours as needed for nausea or vomiting. 20 tablet 1   Turmeric (QC TUMERIC COMPLEX PO) Take 1 capsule by mouth at bedtime.     No current facility-administered medications for this visit.   Facility-Administered Medications Ordered in Other Visits  Medication Dose Route Frequency Provider Last Rate Last Admin   sodium chloride  flush (NS) 0.9 % injection 10 mL  10 mL Intravenous Once Babara Call, MD         PHYSICAL EXAMINATION: ECOG PERFORMANCE STATUS: 1 - Symptomatic but completely ambulatory There were no vitals filed for this visit.  There were no vitals filed for this visit.   Physical Exam Constitutional:      General: She is not in acute distress.    Appearance: She is not diaphoretic.  HENT:     Head: Normocephalic and atraumatic.     Nose:  Nose normal.     Mouth/Throat:     Pharynx: No oropharyngeal exudate.  Eyes:      General: No scleral icterus.    Pupils: Pupils are equal, round, and reactive to light.  Cardiovascular:     Rate and Rhythm: Normal rate and regular rhythm.     Heart sounds: No murmur heard. Pulmonary:     Effort: Pulmonary effort is normal. No respiratory distress.     Comments: Mild decreased breath sound bilaterally.. L>R Abdominal:     General: There is no distension.     Palpations: Abdomen is soft.     Tenderness: There is no abdominal tenderness.  Musculoskeletal:        General: Normal range of motion.     Cervical back: Normal range of motion and neck supple.  Skin:    General: Skin is warm and dry.     Findings: No erythema.  Neurological:     Mental Status: She is alert and oriented to person, place, and time.     Cranial Nerves: No cranial nerve deficit.     Motor: No abnormal muscle tone.     Coordination: Coordination normal.  Psychiatric:        Mood and Affect: Affect normal.       LABORATORY DATA:  I have reviewed the data as listed     Latest Ref Rng & Units 03/30/2024    8:40 AM 03/02/2024    8:03 AM 02/17/2024    8:19 AM  CBC  WBC 4.0 - 10.5 K/uL 7.6  8.0  7.2   Hemoglobin 12.0 - 15.0 g/dL 88.3  87.8  88.4   Hematocrit 36.0 - 46.0 % 36.1  37.2  34.7   Platelets 150 - 400 K/uL 193  219  239       Latest Ref Rng & Units 03/30/2024    8:40 AM 03/16/2024    7:51 AM 03/02/2024    8:03 AM  CMP  Glucose 70 - 99 mg/dL 898  890  893   BUN 8 - 23 mg/dL 27  23  24    Creatinine 0.44 - 1.00 mg/dL 8.88  8.77  8.81   Sodium 135 - 145 mmol/L 137  141  137   Potassium 3.5 - 5.1 mmol/L 3.9  3.7  3.8   Chloride 98 - 111 mmol/L 106  107  104   CO2 22 - 32 mmol/L 24  25  24    Calcium  8.9 - 10.3 mg/dL 8.8  9.3  8.9   Total Protein 6.5 - 8.1 g/dL 7.0   6.9   Total Bilirubin 0.0 - 1.2 mg/dL 0.8   1.1   Alkaline Phos 38 - 126 U/L 77   87   AST 15 - 41 U/L 42   42   ALT 0 - 44 U/L 36   44      RADIOGRAPHIC STUDIES: I have personally reviewed the radiological  images as listed and agreed with the findings in the report. No results found.

## 2024-04-28 LAB — CANCER ANTIGEN 27.29: CA 27.29: 17 U/mL (ref 0.0–38.6)

## 2024-04-28 LAB — CANCER ANTIGEN 15-3: CA 15-3: 18 U/mL (ref 0.0–25.0)

## 2024-05-10 ENCOUNTER — Other Ambulatory Visit: Payer: Self-pay

## 2024-05-11 ENCOUNTER — Other Ambulatory Visit: Payer: Self-pay

## 2024-05-11 ENCOUNTER — Other Ambulatory Visit: Payer: Self-pay | Admitting: Oncology

## 2024-05-11 DIAGNOSIS — C50919 Malignant neoplasm of unspecified site of unspecified female breast: Secondary | ICD-10-CM

## 2024-05-11 MED ORDER — CAPIVASERTIB 200 MG PO TABS
400.0000 mg | ORAL_TABLET | Freq: Two times a day (BID) | ORAL | 0 refills | Status: DC
  Filled 2024-05-11: qty 64, 28d supply, fill #0

## 2024-05-11 NOTE — Progress Notes (Signed)
 Specialty Pharmacy Refill Coordination Note  Amber Blevins is a 73 y.o. female contacted today regarding refills of specialty medication(s) Capivasertib  (TRUQAP )   Patient requested Delivery   Delivery date: 05/19/24   Verified address: [address] [facility] [address]   Medication will be filled on 05/18/24. This fill date is pending response to refill request from provider. Patient is aware and if they have not received fill by intended date they must follow up with pharmacy.

## 2024-05-11 NOTE — Progress Notes (Signed)
 Specialty Pharmacy Ongoing Clinical Assessment Note  Amber Blevins is a 73 y.o. female who is being followed by the specialty pharmacy service for RxSp Oncology   Patient's specialty medication(s) reviewed today: Capivasertib  (TRUQAP )   Missed doses in the last 4 weeks: 0   Patient/Caregiver did not have any additional questions or concerns.   Therapeutic benefit summary: Patient is achieving benefit   Adverse events/side effects summary: No adverse events/side effects   Patient's therapy is appropriate to: Continue    Goals Addressed             This Visit's Progress    Slow Disease Progression   On track    Patient is on track. Patient will maintain adherence. Per labs on 04/27/24, CA 15-3 decreased to 18, and CA 27.29 decreased to 17.          Follow up: 3 months  [facility]

## 2024-05-18 ENCOUNTER — Ambulatory Visit
Admission: RE | Admit: 2024-05-18 | Discharge: 2024-05-18 | Disposition: A | Discharge status: Home / Self Care | Source: Ambulatory Visit | Attending: Oncology | Admitting: Oncology

## 2024-05-18 DIAGNOSIS — J439 Emphysema, unspecified: Secondary | ICD-10-CM | POA: Diagnosis not present

## 2024-05-18 DIAGNOSIS — I2510 Atherosclerotic heart disease of native coronary artery without angina pectoris: Secondary | ICD-10-CM | POA: Insufficient documentation

## 2024-05-18 DIAGNOSIS — I700 Atherosclerosis of aorta: Secondary | ICD-10-CM | POA: Diagnosis not present

## 2024-05-18 DIAGNOSIS — C50919 Malignant neoplasm of unspecified site of unspecified female breast: Secondary | ICD-10-CM | POA: Diagnosis present

## 2024-05-18 DIAGNOSIS — Z7969 Long term (current) use of other immunomodulators and immunosuppressants: Secondary | ICD-10-CM | POA: Insufficient documentation

## 2024-05-18 DIAGNOSIS — C787 Secondary malignant neoplasm of liver and intrahepatic bile duct: Secondary | ICD-10-CM | POA: Insufficient documentation

## 2024-05-18 DIAGNOSIS — C7951 Secondary malignant neoplasm of bone: Secondary | ICD-10-CM | POA: Diagnosis not present

## 2024-05-25 ENCOUNTER — Inpatient Hospital Stay: Attending: Oncology

## 2024-05-25 ENCOUNTER — Encounter: Payer: Self-pay | Admitting: Oncology

## 2024-05-25 ENCOUNTER — Inpatient Hospital Stay

## 2024-05-25 ENCOUNTER — Inpatient Hospital Stay: Admitting: Oncology

## 2024-05-25 VITALS — BP 138/71 | HR 77 | Temp 96.0°F | Resp 17

## 2024-05-25 VITALS — BP 137/72 | HR 85 | Temp 97.4°F | Resp 16 | Wt 194.0 lb

## 2024-05-25 DIAGNOSIS — C50919 Malignant neoplasm of unspecified site of unspecified female breast: Secondary | ICD-10-CM

## 2024-05-25 DIAGNOSIS — C7951 Secondary malignant neoplasm of bone: Secondary | ICD-10-CM | POA: Insufficient documentation

## 2024-05-25 DIAGNOSIS — Z5111 Encounter for antineoplastic chemotherapy: Secondary | ICD-10-CM | POA: Insufficient documentation

## 2024-05-25 DIAGNOSIS — T451X5A Adverse effect of antineoplastic and immunosuppressive drugs, initial encounter: Secondary | ICD-10-CM

## 2024-05-25 DIAGNOSIS — K521 Toxic gastroenteritis and colitis: Secondary | ICD-10-CM

## 2024-05-25 DIAGNOSIS — Z170 Estrogen receptor positive status [ER+]: Secondary | ICD-10-CM | POA: Diagnosis not present

## 2024-05-25 DIAGNOSIS — C50412 Malignant neoplasm of upper-outer quadrant of left female breast: Secondary | ICD-10-CM | POA: Diagnosis present

## 2024-05-25 DIAGNOSIS — G620 Drug-induced polyneuropathy: Secondary | ICD-10-CM

## 2024-05-25 DIAGNOSIS — C801 Malignant (primary) neoplasm, unspecified: Secondary | ICD-10-CM

## 2024-05-25 LAB — CBC WITH DIFFERENTIAL (CANCER CENTER ONLY)
Abs Immature Granulocytes: 0.11 K/uL — ABNORMAL HIGH (ref 0.00–0.07)
Basophils Absolute: 0 K/uL (ref 0.0–0.1)
Basophils Relative: 1 %
Eosinophils Absolute: 0.4 K/uL (ref 0.0–0.5)
Eosinophils Relative: 4 %
HCT: 36.3 % (ref 36.0–46.0)
Hemoglobin: 11.6 g/dL — ABNORMAL LOW (ref 12.0–15.0)
Immature Granulocytes: 1 %
Lymphocytes Relative: 13 %
Lymphs Abs: 1.2 K/uL (ref 0.7–4.0)
MCH: 28.6 pg (ref 26.0–34.0)
MCHC: 32 g/dL (ref 30.0–36.0)
MCV: 89.6 fL (ref 80.0–100.0)
Monocytes Absolute: 0.8 K/uL (ref 0.1–1.0)
Monocytes Relative: 9 %
Neutro Abs: 6.4 K/uL (ref 1.7–7.7)
Neutrophils Relative %: 72 %
Platelet Count: 247 K/uL (ref 150–400)
RBC: 4.05 MIL/uL (ref 3.87–5.11)
RDW: 15.4 % (ref 11.5–15.5)
WBC Count: 8.9 K/uL (ref 4.0–10.5)
nRBC: 0 % (ref 0.0–0.2)

## 2024-05-25 LAB — CMP (CANCER CENTER ONLY)
ALT: 18 U/L (ref 0–44)
AST: 23 U/L (ref 15–41)
Albumin: 4 g/dL (ref 3.5–5.0)
Alkaline Phosphatase: 76 U/L (ref 38–126)
Anion gap: 8 (ref 5–15)
BUN: 22 mg/dL (ref 8–23)
CO2: 25 mmol/L (ref 22–32)
Calcium: 9.2 mg/dL (ref 8.9–10.3)
Chloride: 107 mmol/L (ref 98–111)
Creatinine: 1.35 mg/dL — ABNORMAL HIGH (ref 0.44–1.00)
GFR, Estimated: 41 mL/min — ABNORMAL LOW (ref >60)
Glucose, Bld: 108 mg/dL — ABNORMAL HIGH (ref 70–99)
Potassium: 3.8 mmol/L (ref 3.5–5.1)
Sodium: 140 mmol/L (ref 135–145)
Total Bilirubin: 0.8 mg/dL (ref 0.0–1.2)
Total Protein: 7.2 g/dL (ref 6.5–8.1)

## 2024-05-25 MED ORDER — SODIUM CHLORIDE 0.9 % IV SOLN
Freq: Once | INTRAVENOUS | Status: AC
  Filled 2024-05-25: qty 250

## 2024-05-25 MED ORDER — ZOLEDRONIC ACID 4 MG/5ML IV CONC
3.3000 mg | Freq: Once | INTRAVENOUS | Status: AC
  Administered 2024-05-25: 3.3 mg via INTRAVENOUS
  Filled 2024-05-25: qty 4.13

## 2024-05-25 MED ORDER — FULVESTRANT 250 MG/5ML IM SOSY
500.0000 mg | PREFILLED_SYRINGE | Freq: Once | INTRAMUSCULAR | Status: AC
  Administered 2024-05-25: 500 mg via INTRAMUSCULAR
  Filled 2024-05-25: qty 10

## 2024-05-25 NOTE — Progress Notes (Signed)
 Hematology/Oncology Progress note [phone] [phone]) [phone]     CHIEF COMPLAINTS/REASON FOR VISIT:  metastatic breast caner ASSESSMENT & PLAN:   Cancer Staging  Primary malignant neoplasm of breast with metastasis (HCC) Staging form: Breast, AJCC 8th Edition - Clinical stage from 07/12/2021: Stage IV (rcTX, rcNX, rpM1, ER+, PR+, HER2-) - Signed by Babara Call, MD on 03/08/2022   Primary malignant neoplasm of breast with metastasis (HCC) Metastatic Breast cancer with extensive thoracic and bone involvement, in visceral crisis, ER+, 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  Labs are reviewed and discussed with patient. Continue fulvestrant  and Capivasertib  [400mg  Q12h on D1-4 of each week].  fulvestrant  monthly.  She tolerates well. Glucose is stable.       Metastasis to bone [facility]) 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 Continue calcium  and vitamin D supplementation. [name]  MRI brain showed enlarging right frontal calvarium - s/p RT  Zometa  monthly now, consider eventually increase interval to Q3 months in the future   Chemotherapy induced diarrhea Stable symptoms Imodium PRN  Encounter for antineoplastic chemotherapy Chemotherapy plan as listed above   Chemotherapy-induced neuropathy (HCC) #Grade 2 neuropathy, continue gabapentin ,  Continue gabapentin  600 mg in a.m.,night time  900 mg  Declined acupuncture         Orders Placed This Encounter  Procedures   Cancer antigen 27.29    Standing Status:   Future    Expected Date:    06/22/2024    Expiration Date:   09/20/2024   Cancer antigen 15-3    Standing Status:   Future    Expected Date:   06/22/2024    Expiration Date:   09/20/2024   CBC with Differential (Cancer Center Only)    Standing Status:   Future    Expected Date:   06/22/2024    Expiration Date:   09/20/2024   CMP (Cancer Center only)    Standing Status:   Future    Expected Date:   06/22/2024    Expiration Date:   09/20/2024    Follow up 4 weeks lab MD  All questions were answered. The patient knows to call the clinic with any problems, questions or concerns.  Call Babara, MD, PhD [facility] Hematology Oncology 05/25/2024      HISTORY OF PRESENTING ILLNESS:   Amber Blevins is a  73 y.o.  female with PMH listed below presents for follow up for treatment of metastatic breast cancer.   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 Additional metastatic involvement T12, L1 through L3.  No  primary tumor site identified. Top differential considerations are Metastatic Disease Unknown primary, less likely Lymphoma or Multiple Myeloma.   2. Superimposed lumbar spine degeneration, including degenerative moderate to severe left L3 and L4 nerve level impingement from disc herniation.      07/02/2021 Imaging   CT chest abdomen pelvis showed innumerable small pulmonary and pleural nodules consistent  with diffuse metastasis.  Associated with probable malignant pleural effusion.  Mediastinal and hilar lymphadenopathy consistent with metastatic disease.  Hepatic and bilateral adrenal gland metastasis.  Diffuse extensive destructive metastatic bone disease involving pelvis.   07/12/2021 Cancer Staging   Staging form: Breast, AJCC 8th Edition - Clinical stage from 07/12/2021: Stage IV (rcTX, cNX, pM1, ER+, PR+, HER2-) - Signed by Babara Call, MD on 03/08/2022 Stage prefix: Recurrence   07/17/2021 Initial Diagnosis   Metastatic breast cancer -history of breast cancer, diagnosed in 2010 Left-sided T2 (2.4cm) N0, ER/PR positive, HER2 negative IDC of the breast, s/p lumpectomy by Dr.Meyer at [facility]. Oncotype score of 11 who completed radiation and she finished 10 years of Femara  from 05/2009 and stopped in 2020.  -07/12/2021 patient underwent right thoracentesis.  Cytology was positive for metastatic carcinoma, compatible with breast origin.  ER/PR +, HER2 negative -07/16/2021, patient underwent iliac bone biopsy.  Positive for metastatic carcinoma.   07/21/2021 - 09/16/2021 Chemotherapy   Patient is on Treatment Plan : BREAST Paclitaxel  D1,8,15 q28d     09/04/2021 Imaging   CT chest abdomen pelvis showed improvement of the pulmonary nodularity and thickening in the left and the right lung.  Persistent nodular interstitial and pleural thickening remains.  Improvement of mediastinal and hilar adenopathy, hepatic metastatic lesions are similar.  Adrenal gland metastasis is similar.  Interval increase in the sclerotic bone metastasis.    09/30/2021 -  Chemotherapy   started on abemaciclib  100 mg twice daily and letrozole .   02/24/2022 Imaging    CT chest abdomen pelvis with contrast showed unchanged bilateral pleural effusion with associated intralobular septal thickening of the lung bases.  Without discrete nodularity consistent with stable appearance of treated pleural and lymphangitic metastatic disease.   Stable or minimally diminished subcentimeter liver lesions.  Unchanged left adrenal metastasis.  Unchanged widespread sclerotic osseous metastatic disease involving the included axial and appendicular skeleton.  No evidence of new metastatic disease in the chest abdomen or pelvis.  Coronary artery disease.   03/06/2022 Procedure   Ultrasound guided thoracentesis, removal of pleural fluid.    05/29/2022 Imaging   CT chest abdomen pelvis 1. Stable CT of the chest, abdomen and pelvis. 2. Unchanged appearance of bilateral pleural effusions, pleural nodularity, and interlobular septal thickening compatible with pleural and lymphangitic metastatic disease. 3. Stable appearance of multifocal sclerotic bone metastases. 4. Multifocal low-attenuation liver lesions are unchanged in the interval. 5. Stable appearance of left adrenal gland metastases. 6. Stable appearance of small pericardial effusion.7. No new or progressive disease identified.8. Aortic Atherosclerosis (ICD10-I70.0).   06/06/2022 Imaging   Bone scan Constellation of findings are consistent with multifocal osseous metastatic disease throughout the axial and appendicular skeleton.Of note, osseous metastatic disease involves the RIGHT greater than LEFT femur. Consider evaluation with dedicated hip and femur radiographs to evaluate for overall disease burden and potential risk for pathologic fracture.   08/31/2022 Imaging   CT chest abdomen pelvis w contrast 1. Unchanged small right, trace left pleural effusions with associated diffuse interlobular septal thickening and fine fissural and perilymphatic nodularity throughout the lungs, findings consistent with lymphangitic metastatic disease. 2. Stable  or slightly diminished very subtle hypodense lesion of the central right lobe of the liver, hepatic segment VIII measuring 0.6 cm, previously 0.8 cm. Additional unchanged, very subtle hypodense lesion of hepatic segment IV B measuring 1.1 cm. 3.  Unchanged widespread sclerotic osseous metastatic disease throughout the included axial and proximal appendicular skeleton, particularly dense in the lower lumbar spine and sacrum. 4. Unchanged left adrenal nodule measuring 1.7 x 1.1 cm. 5. Overall constellation of findings is consistent with stable metastatic disease. 6. Coronary artery disease.    12/01/2022 Miscellaneous   [facility] liquid biopsy - PIK3CA mutation. 0.1% Not enough tissue for tissue NGS   03/08/2023 Imaging   Bone scan showed Multifocal osseous metastatic disease. One new focus along the right side of the skull   08/23/2023 - 09/06/2023 Radiation Therapy   Skull radiation.    12/24/2023 Imaging   CT chest abdomen pelvis wo contrast showed 1. 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 2. New indistinct 0.6 cm medial right lower lobe pulmonary nodule, indeterminate for inflammatory nodule versus pulmonary metastasis. Suggest attention on follow-up chest CT in 3 months. 3. Widespread patchy sclerotic osseous metastatic disease, not appreciably changed. 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 4. Stable small layering right pleural effusion. Stable small pericardial effusion. 5. Three-vessel coronary atherosclerosis. 6. Long-term stability of left adrenal nodule favoring an adenoma. 7. Moderate left colonic diverticulosis. 8.  Aortic Atherosclerosis (ICD10-I70.0).     Procedure   Liver biopsy Pathology showed metasatic adenocarcinoma Immunohistochemical stains show that the tumor cells are positive for CK7 and GATA3 while they are negative for CK20 and CDX2, consistent with patient's clinical history of primary breast carcinoma.     ER 95%, PR 0%, HER2 low (1+), Ki 67 5% NGS showed PIK3CA mutation, CHEK mutation.    02/07/2024 -  Chemotherapy   Started on Capivasertib  400mg  Q12h on D1-4 of each week   05/18/2024 Imaging   CT chest abdomen pelvis w contrast   1. Interval  decrease in size of multiple hypodense liver metastases consistent with treatment response. 2. Unchanged widespread sclerotic osseous metastatic disease, notable for chronic wedge deformities of T7 and T10 as well as very extensive involvement of the sacrum. 3. Trace right pleural effusion, slightly diminished in volume. 4. Emphysema. 5. Coronary artery disease.   Aortic Atherosclerosis (ICD10-I70.0) and Emphysema (ICD10-J43.9).   Metastasis to bone (HCC)  02/04/2022 Initial Diagnosis   Metastasis to bone [facility])    Patient follows up with cardiology for evaluation of bradycardia.  Cardiology notes reviewed.  72-hour Holter monitor revealed revealed predominant sinus rhythm with mean heart rate of 96 bpm, heart rate range 53 to 129 bpm, frequent premature ventricular contractions (27% burden), and intermittent atrial fibrillation (5% burden) with longest episode 3 hours. CHA2DS2-VASc score of 3, per cardiology recommendation patient was started on Eliquis 5 mg twice daily.     INTERVAL HISTORY Amber Blevins is a 73 y.o. female who has above history reviewed by me today presents for follow up visit for metastatic breast cancer Occasional back and hip pain..   No Fever or chills. Denies abdominal pain,nausea or vomiting.  Currently on Capivasertib  400mg  Q12h on D1-4 of each week. So far she tolerates well. No significant side effects.    .  Review of Systems  Constitutional:  Negative for appetite change, chills, fatigue and fever.  HENT:   Negative for hearing loss and voice change.   Eyes:  Negative for eye problems.  Respiratory:  Negative for chest tightness, cough and shortness of breath.   Cardiovascular:  Negative for chest pain.  Gastrointestinal:  Negative for abdominal distention, abdominal pain and blood in stool.  Endocrine: Negative for hot flashes.  Genitourinary:  Negative for difficulty urinating and frequency.   Musculoskeletal:  Negative for arthralgias.   Skin:  Negative for itching and rash.  Neurological:  Positive for numbness. Negative for extremity weakness.  Hematological:  Negative for adenopathy.  Psychiatric/Behavioral:  Negative for confusion.      MEDICAL HISTORY:  Past Medical History:  Diagnosis Date   A-fib (HCC)    Breast cancer (HCC)    COPD (chronic obstructive pulmonary disease) (HCC)    Family history of adverse reaction to anesthesia    brother has problem with coming out of anesthesia   High cholesterol    Hypertension    Personal history of radiation therapy    Pre-diabetes     SURGICAL HISTORY: Past Surgical History:  Procedure Laterality Date   BREAST LUMPECTOMY Left    2010   COLONOSCOPY     EYE SURGERY Left    Cataract surgery   PORTACATH PLACEMENT N/A 08/01/2021   Procedure: INSERTION PORT-A-CATH;  Surgeon: Rodolph Romano, MD;  Location: [facility] ORS;  Service: General;  Laterality: N/A;   thorocentesis Right 07/10/2021   and another on 07/25/21    SOCIAL HISTORY: Social History   Socioeconomic History   Marital status: Married    Spouse name: Not on file   Number of children: Not on file   Years of education: Not on file   Highest education level: Not on file  Occupational History   Not on file  Tobacco Use   Smoking status: Former    Current packs/day: 0.00    Average packs/day: 1 pack/day for 25.0 years (25.0 ttl pk-yrs)    Types: Cigarettes    Start date: 88    Quit date: 45    Years since quitting: 35.6   Smokeless tobacco: Never  Vaping Use   Vaping status: Never Used  Substance and Sexual Activity   Alcohol use: Not Currently   Drug use: Never   Sexual activity: Not on file  Other Topics Concern   Not on file  Social History Narrative   Not on file   Social Drivers of Health   Financial Resource Strain: Low Risk  (07/26/2023)   Received from [facility]   Overall Financial Resource Strain (CARDIA)    Difficulty of Paying Living Expenses:  Not hard at all  Food Insecurity: No Food Insecurity (07/26/2023)   [facility]   Hunger Vital Sign    Within the past 12 months, you worried that your food would run out before you got the money to buy more.: Never true    Within the past 12 months, the food you bought just didn't last and you didn't have money to get more.: Never true  Transportation Needs: No Transportation Needs (07/26/2023)   Received from [facility] - Transportation    In the past 12 months, has lack of transportation kept you from medical appointments or from getting medications?: No    Lack of Transportation (Non-Medical): No  Physical Activity: Not on file  Stress: Not on file  Social Connections: Not on file  Intimate Partner Violence: Not on file    FAMILY HISTORY: Family History  Problem Relation Age of Onset   Ovarian cancer Mother 74  Lung cancer Mother 20   Diabetes Father    Heart disease Father    Parkinson's disease Father    Bladder Cancer Brother 9   Pulmonary disease Brother    Rheumatic fever Brother    Prostate cancer Cousin 6   Prostate cancer Cousin 60   Breast cancer Neg Hx     ALLERGIES:  is allergic to green tea (camellia sinensis), melaleuca viridiflora, tea tree oil, and lisinopril.  MEDICATIONS:  Current Outpatient Medications  Medication Sig Dispense Refill   acetaminophen  (TYLENOL ) 650 MG CR tablet Take 650 mg by mouth every 8 (eight) hours as needed for pain.     atorvastatin  (LIPITOR) 40 MG tablet Take 40 mg by mouth at bedtime.     Boswellia-Glucosamine-Vit D (OSTEO BI-FLEX-GLUCOS/5-LOXIN) TABS Take 1 capsule by mouth in the morning and at bedtime.     Calcium  Carb-Cholecalciferol 600-400 MG-UNIT TABS Take 1 tablet by mouth 2 (two) times daily.     capivasertib  (TRUQAP ) 200 MG tablet Take 2 tablets (400 mg total) by mouth 2 (two) times daily. Take for 4 days, then hold for 3 days. Repeat every 7 days. 64  tablet 0   cetirizine (ZYRTEC) 10 MG chewable tablet Chew 10 mg by mouth daily as needed for allergies.     cyanocobalamin 1000 MCG tablet Take 1,000 mcg by mouth daily.     diphenoxylate -atropine  (LOMOTIL ) 2.5-0.025 MG tablet Take 1 tablet by mouth 4 (four) times daily as needed for diarrhea or loose stools. 30 tablet 0   Docusate Sodium (DSS) 100 MG CAPS Take 1 capsule by mouth daily.     ELIQUIS 5 MG TABS tablet Take 5 mg by mouth 2 (two) times daily.     gabapentin  (NEURONTIN ) 300 MG capsule TAKE 2 CAPSULES BY MOUTH IN THE MORNING AND 3 CAPSULES IN THE EVENING 450 capsule 0   Iron-Vitamin C (VITRON-C) 65-125 MG TABS Take 1 tablet by mouth daily.     lidocaine -prilocaine  (EMLA ) cream Apply small amount to port and cover with saran wrap 1-2 hours prior to port access 30 g 5   Loperamide HCl (IMODIUM PO) Take by mouth.     losartan (COZAAR) 50 MG tablet Take 50 mg by mouth daily.     magnesium chloride (SLOW-MAG) 64 MG TBEC SR tablet Take 1 tablet by mouth at bedtime.     melatonin (MELATONIN MAXIMUM STRENGTH) 5 MG TABS Take 1 tablet by mouth at bedtime as needed.     ondansetron  (ZOFRAN ) 8 MG tablet Take 1 tablet (8 mg total) by mouth every 8 (eight) hours as needed for nausea or vomiting. 20 tablet 1   Turmeric (QC TUMERIC COMPLEX PO) Take 1 capsule by mouth at bedtime.     No current facility-administered medications for this visit.   Facility-Administered Medications Ordered in Other Visits  Medication Dose Route Frequency Provider Last Rate Last Admin   sodium chloride  flush (NS) 0.9 % injection 10 mL  10 mL Intravenous Once Babara Call, MD         PHYSICAL EXAMINATION: ECOG PERFORMANCE STATUS: 1 - Symptomatic but completely ambulatory Vitals:   05/25/24 0832  BP: 137/72  Pulse: 85  Resp: 16  Temp: (!) 97.4 F (36.3 C)  SpO2: 100%    Filed Weights   05/25/24 0832  Weight: 194 lb (88 kg)     Physical Exam Constitutional:      General: She is not in acute distress.     Appearance: She is not diaphoretic.  HENT:     Head: Normocephalic and atraumatic.     Nose: Nose normal.     Mouth/Throat:     Pharynx: No oropharyngeal exudate.  Eyes:     General: No scleral icterus.    Pupils: Pupils are equal, round, and reactive to light.  Cardiovascular:     Rate and Rhythm: Normal rate and regular rhythm.     Heart sounds: No murmur heard. Pulmonary:     Effort: Pulmonary effort is normal. No respiratory distress.     Comments: Mild decreased breath sound bilaterally.. L>R Abdominal:     General: There is no distension.     Palpations: Abdomen is soft.     Tenderness: There is no abdominal tenderness.  Musculoskeletal:        General: Normal range of motion.     Cervical back: Normal range of motion and neck supple.  Skin:    General: Skin is warm and dry.     Findings: No erythema.  Neurological:     Mental Status: She is alert and oriented to person, place, and time.     Cranial Nerves: No cranial nerve deficit.     Motor: No abnormal muscle tone.     Coordination: Coordination normal.  Psychiatric:        Mood and Affect: Affect normal.       LABORATORY DATA:  I have reviewed the data as listed     Latest Ref Rng & Units 05/25/2024    8:15 AM 04/27/2024    8:10 AM 03/30/2024    8:40 AM  CBC  WBC 4.0 - 10.5 K/uL 8.9  8.7  7.6   Hemoglobin 12.0 - 15.0 g/dL 88.3  88.5  88.3   Hematocrit 36.0 - 46.0 % 36.3  35.0  36.1   Platelets 150 - 400 K/uL 247  240  193       Latest Ref Rng & Units 05/25/2024    8:15 AM 04/27/2024    8:10 AM 03/30/2024    8:40 AM  CMP  Glucose 70 - 99 mg/dL 891  854  898   BUN 8 - 23 mg/dL 22  20  27    Creatinine 0.44 - 1.00 mg/dL 8.64  8.85  8.88   Sodium 135 - 145 mmol/L 140  135  137   Potassium 3.5 - 5.1 mmol/L 3.8  3.9  3.9   Chloride 98 - 111 mmol/L 107  104  106   CO2 22 - 32 mmol/L 25  25  24    Calcium  8.9 - 10.3 mg/dL 9.2  8.8  8.8   Total Protein 6.5 - 8.1 g/dL 7.2  6.8  7.0   Total Bilirubin 0.0 - 1.2  mg/dL 0.8  0.7  0.8   Alkaline Phos 38 - 126 U/L 76  79  77   AST 15 - 41 U/L 23  23  42   ALT 0 - 44 U/L 18  19  36      RADIOGRAPHIC STUDIES: I have personally reviewed the radiological images as listed and agreed with the findings in the report. CT CHEST ABDOMEN PELVIS WO CONTRAST Result Date: 05/24/2024 CLINICAL DATA:  Metastatic breast cancer restaging, ongoing oral chemotherapy * Tracking Code: BO * EXAM: CT CHEST, ABDOMEN AND PELVIS WITHOUT CONTRAST TECHNIQUE: Multidetector CT imaging of the chest, abdomen and pelvis was performed following the standard protocol without IV contrast. RADIATION DOSE REDUCTION: This exam was performed according to the departmental dose-optimization program which  includes automated exposure control, adjustment of the mA and/or kV according to patient size and/or use of iterative reconstruction technique. COMPARISON:  12/17/2023 FINDINGS: CT CHEST FINDINGS Cardiovascular: Right chest port catheter. Aortic atherosclerosis. Normal heart size. Three-vessel coronary artery calcifications. No pericardial effusion. Mediastinum/Nodes: No enlarged mediastinal, hilar, or axillary lymph nodes. Thyroid gland, trachea, and esophagus demonstrate no significant findings. Lungs/Pleura: Trace right pleural effusion, slightly diminished in volume. Mild paraseptal emphysema. Unchanged bandlike scarring of the lung bases. Musculoskeletal: Unchanged irregular mass in the upper outer left breast (series 2, image 20). Left axillary lymph node dissection. No acute osseous findings. CT ABDOMEN PELVIS FINDINGS Hepatobiliary: Interval decrease in size of multiple hypodense liver metastases, index lesion in the right lobe measuring 0.9 x 0.8 cm, previously 2.2 x 1.8 cm (series 2, image 56). No gallstones, gallbladder wall thickening, or biliary dilatation. Pancreas: Unremarkable. No pancreatic ductal dilatation or surrounding inflammatory changes. Spleen: Normal in size without significant  abnormality. Adrenals/Urinary Tract: Benign macroscopic fat containing bilateral adrenal adenomata, for which no further follow-up or characterization is required (series 2, image 56). Benign parapelvic cysts of the left kidney. No calculi or hydronephrosis. Bladder unremarkable. Stomach/Bowel: Stomach is within normal limits. Appendix appears normal. No evidence of bowel wall thickening, distention, or inflammatory changes. Colonic diverticulosis. Vascular/Lymphatic: Aortic atherosclerosis. No enlarged abdominal or pelvic lymph nodes. Reproductive: No mass or other abnormality. Other: No abdominal wall hernia or abnormality. No ascites. Musculoskeletal: No acute osseous findings. Unchanged widespread sclerotic osseous metastatic disease, notable for chronic wedge deformities of T7 and T10 as well as very extensive involvement of the sacrum. IMPRESSION: 1. Interval decrease in size of multiple hypodense liver metastases consistent with treatment response. 2. Unchanged widespread sclerotic osseous metastatic disease, notable for chronic wedge deformities of T7 and T10 as well as very extensive involvement of the sacrum. 3. Trace right pleural effusion, slightly diminished in volume. 4. Emphysema. 5. Coronary artery disease. Aortic Atherosclerosis (ICD10-I70.0) and Emphysema (ICD10-J43.9). Electronically Signed   By: Marolyn JONETTA Jaksch M.D.   On: 05/24/2024 16:16

## 2024-05-25 NOTE — Assessment & Plan Note (Signed)
#  Grade 2 neuropathy, continue gabapentin ,  Continue gabapentin  600 mg in a.m.,night time  900 mg  Declined acupuncture

## 2024-05-25 NOTE — Assessment & Plan Note (Signed)
 Stable symptoms Imodium PRN

## 2024-05-25 NOTE — Assessment & Plan Note (Signed)
 Chemotherapy plan as listed above

## 2024-05-25 NOTE — Addendum Note (Signed)
 Addended by: LORING NO H on: 05/25/2024 09:06 AM   Modules accepted: Orders

## 2024-05-25 NOTE — Assessment & Plan Note (Signed)
 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 Continue calcium  and vitamin D supplementation. [name]  MRI brain showed enlarging right frontal calvarium - s/p RT  Zometa  monthly now, consider eventually increase interval to Q3 months in the future

## 2024-05-25 NOTE — Assessment & Plan Note (Addendum)
 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  Labs are reviewed and discussed with patient. Continue fulvestrant  and Capivasertib  [400mg  Q12h on D1-4 of each week].  fulvestrant  monthly.  She tolerates well. Glucose is stable.

## 2024-05-26 LAB — CANCER ANTIGEN 27.29: CA 27.29: 17.3 U/mL (ref 0.0–38.6)

## 2024-05-26 LAB — CANCER ANTIGEN 15-3: CA 15-3: 15.3 U/mL (ref 0.0–25.0)

## 2024-06-05 ENCOUNTER — Other Ambulatory Visit: Payer: Self-pay | Admitting: Oncology

## 2024-06-09 ENCOUNTER — Other Ambulatory Visit: Payer: Self-pay | Admitting: Pharmacy Technician

## 2024-06-09 ENCOUNTER — Other Ambulatory Visit: Payer: Self-pay

## 2024-06-09 ENCOUNTER — Other Ambulatory Visit: Payer: Self-pay | Admitting: Oncology

## 2024-06-09 ENCOUNTER — Encounter: Payer: Self-pay | Admitting: Oncology

## 2024-06-09 DIAGNOSIS — C50919 Malignant neoplasm of unspecified site of unspecified female breast: Secondary | ICD-10-CM

## 2024-06-09 NOTE — Progress Notes (Signed)
 Specialty Pharmacy Refill Coordination Note  Amber Blevins is a 73 y.o. female contacted today regarding refills of specialty medication(s) Capivasertib  (TRUQAP )   Patient requested Delivery   Delivery date: 06/20/24   Verified address: [address]  [facility] [address]   Medication will be filled on 06/19/24.  This fill date is pending response to refill request from provider. Patient is aware and if they have not received fill by intended date they must follow up with pharmacy.   Patient request to send meds later due to leaving out of town.

## 2024-06-13 ENCOUNTER — Encounter: Payer: Self-pay | Admitting: Oncology

## 2024-06-13 ENCOUNTER — Other Ambulatory Visit: Payer: Self-pay

## 2024-06-13 MED ORDER — CAPIVASERTIB 200 MG PO TABS
400.0000 mg | ORAL_TABLET | Freq: Two times a day (BID) | ORAL | 0 refills | Status: DC
Start: 2024-06-13 — End: 2024-07-10
  Filled 2024-06-13: qty 64, 28d supply, fill #0

## 2024-06-19 ENCOUNTER — Other Ambulatory Visit: Payer: Self-pay

## 2024-06-22 ENCOUNTER — Encounter: Payer: Self-pay | Admitting: Oncology

## 2024-06-22 ENCOUNTER — Inpatient Hospital Stay

## 2024-06-22 ENCOUNTER — Inpatient Hospital Stay: Admitting: Oncology

## 2024-06-22 ENCOUNTER — Inpatient Hospital Stay: Attending: Oncology

## 2024-06-22 VITALS — BP 143/75 | HR 87 | Temp 97.6°F | Resp 19 | Wt 198.7 lb

## 2024-06-22 DIAGNOSIS — C50919 Malignant neoplasm of unspecified site of unspecified female breast: Secondary | ICD-10-CM | POA: Diagnosis not present

## 2024-06-22 DIAGNOSIS — Z23 Encounter for immunization: Secondary | ICD-10-CM | POA: Insufficient documentation

## 2024-06-22 DIAGNOSIS — R7989 Other specified abnormal findings of blood chemistry: Secondary | ICD-10-CM

## 2024-06-22 DIAGNOSIS — K521 Toxic gastroenteritis and colitis: Secondary | ICD-10-CM | POA: Diagnosis not present

## 2024-06-22 DIAGNOSIS — C7951 Secondary malignant neoplasm of bone: Secondary | ICD-10-CM

## 2024-06-22 DIAGNOSIS — Z5111 Encounter for antineoplastic chemotherapy: Secondary | ICD-10-CM | POA: Insufficient documentation

## 2024-06-22 DIAGNOSIS — C50412 Malignant neoplasm of upper-outer quadrant of left female breast: Secondary | ICD-10-CM | POA: Diagnosis present

## 2024-06-22 DIAGNOSIS — Z170 Estrogen receptor positive status [ER+]: Secondary | ICD-10-CM | POA: Diagnosis not present

## 2024-06-22 DIAGNOSIS — T451X5A Adverse effect of antineoplastic and immunosuppressive drugs, initial encounter: Secondary | ICD-10-CM | POA: Diagnosis not present

## 2024-06-22 DIAGNOSIS — G620 Drug-induced polyneuropathy: Secondary | ICD-10-CM | POA: Diagnosis not present

## 2024-06-22 LAB — CBC WITH DIFFERENTIAL (CANCER CENTER ONLY)
Abs Immature Granulocytes: 0.15 K/uL — ABNORMAL HIGH (ref 0.00–0.07)
Basophils Absolute: 0 K/uL (ref 0.0–0.1)
Basophils Relative: 0 %
Eosinophils Absolute: 0.3 K/uL (ref 0.0–0.5)
Eosinophils Relative: 4 %
HCT: 34.7 % — ABNORMAL LOW (ref 36.0–46.0)
Hemoglobin: 11.2 g/dL — ABNORMAL LOW (ref 12.0–15.0)
Immature Granulocytes: 2 %
Lymphocytes Relative: 12 %
Lymphs Abs: 1.1 K/uL (ref 0.7–4.0)
MCH: 28.5 pg (ref 26.0–34.0)
MCHC: 32.3 g/dL (ref 30.0–36.0)
MCV: 88.3 fL (ref 80.0–100.0)
Monocytes Absolute: 0.7 K/uL (ref 0.1–1.0)
Monocytes Relative: 8 %
Neutro Abs: 6.9 K/uL (ref 1.7–7.7)
Neutrophils Relative %: 74 %
Platelet Count: 237 K/uL (ref 150–400)
RBC: 3.93 MIL/uL (ref 3.87–5.11)
RDW: 15.9 % — ABNORMAL HIGH (ref 11.5–15.5)
WBC Count: 9.2 K/uL (ref 4.0–10.5)
nRBC: 0 % (ref 0.0–0.2)

## 2024-06-22 LAB — CMP (CANCER CENTER ONLY)
ALT: 20 U/L (ref 0–44)
AST: 24 U/L (ref 15–41)
Albumin: 3.8 g/dL (ref 3.5–5.0)
Alkaline Phosphatase: 72 U/L (ref 38–126)
Anion gap: 7 (ref 5–15)
BUN: 26 mg/dL — ABNORMAL HIGH (ref 8–23)
CO2: 24 mmol/L (ref 22–32)
Calcium: 8.9 mg/dL (ref 8.9–10.3)
Chloride: 106 mmol/L (ref 98–111)
Creatinine: 1.45 mg/dL — ABNORMAL HIGH (ref 0.44–1.00)
GFR, Estimated: 38 mL/min — ABNORMAL LOW (ref >60)
Glucose, Bld: 165 mg/dL — ABNORMAL HIGH (ref 70–99)
Potassium: 3.9 mmol/L (ref 3.5–5.1)
Sodium: 137 mmol/L (ref 135–145)
Total Bilirubin: 0.9 mg/dL (ref 0.0–1.2)
Total Protein: 6.9 g/dL (ref 6.5–8.1)

## 2024-06-22 MED ORDER — ZOLEDRONIC ACID 4 MG/5ML IV CONC
3.3000 mg | Freq: Once | INTRAVENOUS | Status: AC
  Administered 2024-06-22: 3.3 mg via INTRAVENOUS
  Filled 2024-06-22: qty 4.13

## 2024-06-22 MED ORDER — INFLUENZA VAC SPLIT HIGH-DOSE 0.5 ML IM SUSY
0.5000 mL | PREFILLED_SYRINGE | Freq: Once | INTRAMUSCULAR | Status: AC
  Administered 2024-06-22: 0.5 mL via INTRAMUSCULAR
  Filled 2024-06-22: qty 0.5

## 2024-06-22 MED ORDER — FULVESTRANT 250 MG/5ML IM SOSY
500.0000 mg | PREFILLED_SYRINGE | Freq: Once | INTRAMUSCULAR | Status: AC
  Administered 2024-06-22: 500 mg via INTRAMUSCULAR
  Filled 2024-06-22: qty 10

## 2024-06-22 NOTE — Assessment & Plan Note (Signed)
#  Grade 2 neuropathy, continue gabapentin ,  Continue gabapentin  600 mg in a.m.,night time  900 mg  Previously declined acupuncture

## 2024-06-22 NOTE — Assessment & Plan Note (Signed)
 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  Labs are reviewed and discussed with patient. Continue fulvestrant  and Capivasertib  [400mg  Q12h on D1-4 of each week].  fulvestrant  monthly.  She tolerates well. Glucose is stable.

## 2024-06-22 NOTE — Assessment & Plan Note (Signed)
Influenza vaccination today 

## 2024-06-22 NOTE — Assessment & Plan Note (Signed)
 Stable symptoms Imodium PRN

## 2024-06-22 NOTE — Assessment & Plan Note (Signed)
 Chemotherapy plan as listed above

## 2024-06-22 NOTE — Assessment & Plan Note (Signed)
 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 Continue calcium  and vitamin D supplementation. [name]  MRI brain showed enlarging right frontal calvarium - s/p RT  Zometa  monthly now, consider eventually increase interval to Q3 months in the future

## 2024-06-22 NOTE — Progress Notes (Signed)
 Hematology/Oncology Progress note [phone] Fax:([phone]     CHIEF COMPLAINTS/REASON FOR VISIT:  metastatic breast caner ASSESSMENT & PLAN:   Cancer Staging  Primary malignant neoplasm of breast with metastasis (HCC) Staging form: Breast, AJCC 8th Edition - Clinical stage from 07/12/2021: Stage IV (rcTX, rcNX, rpM1, ER+, PR+, HER2-) - Signed by Babara Call, MD on 03/08/2022   Encounter for antineoplastic chemotherapy Chemotherapy plan as listed above   Primary malignant neoplasm of breast with metastasis (HCC) Metastatic Breast cancer with extensive thoracic and bone involvement, in visceral crisis, ER+, 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  Labs are reviewed and discussed with patient. Continue fulvestrant  and Capivasertib  [400mg  Q12h on D1-4 of each week].  fulvestrant  monthly.  She tolerates well. Glucose is stable.       Chemotherapy induced diarrhea Stable symptoms Imodium PRN  Metastasis to bone [facility]) 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 Continue calcium  and vitamin D supplementation. [name]  MRI brain showed enlarging right frontal calvarium - s/p RT  Zometa  monthly now, consider eventually increase interval to Q3 months in the future   Chemotherapy-induced neuropathy #Grade 2 neuropathy, continue gabapentin ,  Continue gabapentin  600 mg in a.m.,night time  900 mg  Previously declined acupuncture    Need for prophylactic vaccination and inoculation against influenza Influenza vaccination today  Elevated serum  creatinine Encourage oral hydration and avoid nephrotoxins.  Could be side effects from Capivasertib         Orders Placed This Encounter  Procedures   CMP (Cancer Center only)    Standing Status:   Future    Expected Date:   07/20/2024    Expiration Date:   10/18/2024   CBC with Differential (Cancer Center Only)    Standing Status:   Future    Expected Date:   07/20/2024    Expiration Date:   10/18/2024   Cancer antigen 27.29    Standing Status:   Future    Expected Date:   07/20/2024    Expiration Date:   10/18/2024   Cancer antigen 15-3    Standing Status:   Future    Expected Date:   07/20/2024    Expiration Date:   10/18/2024    Follow up 4 weeks lab MD  All questions were answered. The patient knows to call the clinic with any problems, questions or concerns.  Call Babara, MD, PhD [facility] Hematology Oncology 06/22/2024      HISTORY OF PRESENTING ILLNESS:   Amber Blevins is a  73 y.o.  female with PMH listed below presents for follow up for treatment of metastatic breast cancer.   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 Additional metastatic involvement T12, L1 through L3.  No primary tumor site identified. Top differential considerations are Metastatic Disease Unknown primary, less likely Lymphoma or Multiple Myeloma.   2. Superimposed lumbar spine  degeneration, including degenerative moderate to severe left L3 and L4 nerve level impingement from disc herniation.      07/02/2021 Imaging   CT chest abdomen pelvis showed innumerable small pulmonary and pleural nodules consistent with diffuse metastasis.  Associated with probable malignant pleural effusion.  Mediastinal and hilar lymphadenopathy consistent with metastatic disease.  Hepatic and bilateral adrenal gland metastasis.  Diffuse extensive destructive metastatic bone disease involving pelvis.   07/12/2021 Cancer Staging   Staging form: Breast, AJCC 8th Edition - Clinical stage from 07/12/2021: Stage IV (rcTX, cNX, pM1, ER+, PR+, HER2-) - Signed by Babara Call, MD on 03/08/2022 Stage prefix: Recurrence   07/17/2021 Initial Diagnosis   Metastatic breast cancer -history of breast cancer, diagnosed in 2010 Left-sided T2 (2.4cm) N0, ER/PR positive, HER2 negative IDC of the breast, s/p lumpectomy by Dr[facility]Meyer at [facility]. Oncotype score of 11 who completed radiation and she finished 10 years of Femara  from 05/2009 and stopped in 2020.  -07/12/2021 patient underwent right thoracentesis.  Cytology was positive for metastatic carcinoma, compatible with breast origin.  ER/PR +, HER2 negative -07/16/2021, patient underwent iliac bone biopsy.  Positive for metastatic carcinoma.   07/21/2021 - 09/16/2021 Chemotherapy   Patient is on Treatment Plan : BREAST Paclitaxel  D1,8,15 q28d     09/04/2021 Imaging   CT chest abdomen pelvis showed improvement of the pulmonary nodularity and thickening in the left and the right lung.  Persistent nodular interstitial and pleural thickening remains.  Improvement of mediastinal and hilar adenopathy, hepatic metastatic lesions are similar.  Adrenal gland metastasis is similar.  Interval increase in the sclerotic bone metastasis.    09/30/2021 -  Chemotherapy   started on abemaciclib  100 mg twice daily and letrozole .   02/24/2022 Imaging    CT chest abdomen pelvis with  contrast showed unchanged bilateral pleural effusion with associated intralobular septal thickening of the lung bases.  Without discrete nodularity consistent with stable appearance of treated pleural and lymphangitic metastatic disease.  Stable or minimally diminished subcentimeter liver lesions.  Unchanged left adrenal metastasis.  Unchanged widespread sclerotic osseous metastatic disease involving the included axial and appendicular skeleton.  No evidence of new metastatic disease in the chest abdomen or pelvis.  Coronary artery disease.   03/06/2022 Procedure   Ultrasound guided thoracentesis, removal of pleural fluid.    05/29/2022 Imaging   CT chest abdomen pelvis 1. Stable CT of the chest, abdomen and pelvis. 2. Unchanged appearance of bilateral pleural effusions, pleural nodularity, and interlobular septal thickening compatible with pleural and lymphangitic metastatic disease. 3. Stable appearance of multifocal sclerotic bone metastases. 4. Multifocal low-attenuation liver lesions are unchanged in the interval. 5. Stable appearance of left adrenal gland metastases. 6. Stable appearance of small pericardial effusion.7. No new or progressive disease identified.8. Aortic Atherosclerosis (ICD10-I70.0).   06/06/2022 Imaging   Bone scan Constellation of findings are consistent with multifocal osseous metastatic disease throughout the axial and appendicular skeleton.Of note, osseous metastatic disease involves the RIGHT greater than LEFT femur. Consider evaluation with dedicated hip and femur radiographs to evaluate for overall disease burden and potential risk for pathologic fracture.   08/31/2022 Imaging   CT chest abdomen pelvis w contrast 1.  Unchanged small right, trace left pleural effusions with associated diffuse interlobular septal thickening and fine fissural and perilymphatic nodularity throughout the lungs, findings consistent with lymphangitic metastatic disease. 2. Stable or slightly  diminished very subtle hypodense lesion of the central right lobe of the liver, hepatic segment VIII measuring 0.6 cm, previously 0.8 cm. Additional unchanged, very subtle hypodense lesion of hepatic segment IV B measuring 1.1 cm. 3. Unchanged widespread sclerotic osseous metastatic disease throughout the included axial and proximal appendicular skeleton, particularly dense in the lower lumbar spine and sacrum. 4. Unchanged left adrenal nodule measuring 1.7 x 1.1 cm. 5. Overall constellation of findings is consistent with stable metastatic disease. 6. Coronary artery disease.    12/01/2022 Miscellaneous   [facility] liquid biopsy - PIK3CA mutation. 0.1% Not enough tissue for tissue NGS   03/08/2023 Imaging   Bone scan showed Multifocal osseous metastatic disease. One new focus along the right side of the skull   08/23/2023 - 09/06/2023 Radiation Therapy   Skull radiation.    12/24/2023 Imaging   CT chest abdomen pelvis wo contrast showed 1. 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 2. New indistinct 0.6 cm medial right lower lobe pulmonary nodule, indeterminate for inflammatory nodule versus pulmonary metastasis. Suggest attention on follow-up chest CT in 3 months. 3. Widespread patchy sclerotic osseous metastatic disease, not appreciably changed. 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 4. Stable small layering right pleural effusion. Stable small pericardial effusion. 5. Three-vessel coronary atherosclerosis. 6. Long-term stability of left adrenal nodule favoring an adenoma. 7. Moderate left colonic diverticulosis. 8.  Aortic Atherosclerosis (ICD10-I70.0).     Procedure   Liver biopsy Pathology showed metasatic adenocarcinoma Immunohistochemical stains show that the tumor cells are positive for CK7 and GATA3 while they are negative for CK20 and CDX2, consistent with patient's clinical history of primary breast carcinoma.     ER 95%, PR  0%, HER2 low (1+), Ki 67 5% NGS showed PIK3CA mutation, CHEK mutation.    02/07/2024 -  Chemotherapy   Started on Capivasertib  400mg  Q12h on D1-4 of each week   05/18/2024 Imaging   CT chest abdomen pelvis w contrast   1. Interval decrease in size of multiple hypodense liver metastases consistent with treatment response. 2. Unchanged widespread sclerotic osseous metastatic disease, notable for chronic wedge deformities of T7 and T10 as well as very extensive involvement of the sacrum. 3. Trace right pleural effusion, slightly diminished in volume. 4. Emphysema. 5. Coronary artery disease.   Aortic Atherosclerosis (ICD10-I70.0) and Emphysema (ICD10-J43.9).   Metastasis to bone (HCC)  02/04/2022 Initial Diagnosis   Metastasis to bone [facility])    Patient follows up with cardiology for evaluation of bradycardia.  Cardiology notes reviewed.  72-hour Holter monitor revealed revealed predominant sinus rhythm with mean heart rate of 96 bpm, heart rate range 53 to 129 bpm, frequent premature ventricular contractions (27% burden), and intermittent atrial fibrillation (5% burden) with longest episode 3 hours. CHA2DS2-VASc score of 3, per cardiology recommendation patient was started on Eliquis 5 mg twice daily.     INTERVAL HISTORY LIZANIA BOUCHARD is a 73 y.o. female who has above history reviewed by me today presents for follow up visit for metastatic breast cancer Occasional back and hip pain..   No Fever or chills. Denies abdominal pain,nausea or vomiting.  Currently on Capivasertib  400mg  Q12h on D1-4 of each week. So far she tolerates well.  She has had facial redness which she feels maybe due to a recent new  brand of tooth paste. Redness resolved.  No rash.    .  Review of Systems  Constitutional:  Negative for appetite change, chills, fatigue and fever.  HENT:   Negative for hearing loss and voice change.   Eyes:  Negative for eye problems.  Respiratory:  Negative for chest  tightness, cough and shortness of breath.   Cardiovascular:  Negative for chest pain.  Gastrointestinal:  Negative for abdominal distention, abdominal pain and blood in stool.  Endocrine: Negative for hot flashes.  Genitourinary:  Negative for difficulty urinating and frequency.   Musculoskeletal:  Negative for arthralgias.  Skin:  Negative for itching and rash.  Neurological:  Positive for numbness. Negative for extremity weakness.  Hematological:  Negative for adenopathy.  Psychiatric/Behavioral:  Negative for confusion.      MEDICAL HISTORY:  Past Medical History:  Diagnosis Date   A-fib (HCC)    Breast cancer (HCC)    COPD (chronic obstructive pulmonary disease) (HCC)    Family history of adverse reaction to anesthesia    brother has problem with coming out of anesthesia   High cholesterol    Hypertension    Personal history of radiation therapy    Pre-diabetes     SURGICAL HISTORY: Past Surgical History:  Procedure Laterality Date   BREAST LUMPECTOMY Left    2010   COLONOSCOPY     EYE SURGERY Left    Cataract surgery   PORTACATH PLACEMENT N/A 08/01/2021   Procedure: INSERTION PORT-A-CATH;  Surgeon: Rodolph Romano, MD;  Location: [facility] ORS;  Service: General;  Laterality: N/A;   thorocentesis Right 07/10/2021   and another on 07/25/21    SOCIAL HISTORY: Social History   Socioeconomic History   Marital status: Married    Spouse name: Not on file   Number of children: Not on file   Years of education: Not on file   Highest education level: Not on file  Occupational History   Not on file  Tobacco Use   Smoking status: Former    Current packs/day: 0.00    Average packs/day: 1 pack/day for 25.0 years (25.0 ttl pk-yrs)    Types: Cigarettes    Start date: 37    Quit date: 22    Years since quitting: 35.7   Smokeless tobacco: Never  Vaping Use   Vaping status: Never Used  Substance and Sexual Activity   Alcohol use: Not Currently   Drug use: Never    Sexual activity: Not on file  Other Topics Concern   Not on file  Social History Narrative   Not on file   Social Drivers of Health   Financial Resource Strain: Low Risk  (07/26/2023)   Received from [facility]   Overall Financial Resource Strain (CARDIA)    Difficulty of Paying Living Expenses: Not hard at all  Food Insecurity: No Food Insecurity (07/26/2023)   Received from [facility]   Hunger Vital Sign    Within the past 12 months, you worried that your food would run out before you got the money to buy more.: Never true    Within the past 12 months, the food you bought just didn't last and you didn't have money to get more.: Never true  Transportation Needs: No Transportation Needs (07/26/2023)   Received from [facility] - Transportation    In the past 12 months, has lack of transportation kept you from medical appointments or from getting medications?: No  Lack of Transportation (Non-Medical): No  Physical Activity: Not on file  Stress: Not on file  Social Connections: Not on file  Intimate Partner Violence: Not on file    FAMILY HISTORY: Family History  Problem Relation Age of Onset   Ovarian cancer Mother 62   Lung cancer Mother 43   Diabetes Father    Heart disease Father    Parkinson's disease Father    Bladder Cancer Brother 57   Pulmonary disease Brother    Rheumatic fever Brother    Prostate cancer Cousin 55   Prostate cancer Cousin 29   Breast cancer Neg Hx     ALLERGIES:  is allergic to green tea (camellia sinensis), melaleuca viridiflora, tea tree oil, and lisinopril.  MEDICATIONS:  Current Outpatient Medications  Medication Sig Dispense Refill   acetaminophen  (TYLENOL ) 650 MG CR tablet Take 650 mg by mouth every 8 (eight) hours as needed for pain.     atorvastatin  (LIPITOR) 40 MG tablet Take 40 mg by mouth at bedtime.     Boswellia-Glucosamine-Vit D (OSTEO  BI-FLEX-GLUCOS/5-LOXIN) TABS Take 1 capsule by mouth in the morning and at bedtime.     Calcium  Carb-Cholecalciferol 600-400 MG-UNIT TABS Take 1 tablet by mouth 2 (two) times daily.     capivasertib  (TRUQAP ) 200 MG tablet Take 2 tablets (400 mg total) by mouth 2 (two) times daily. Take for 4 days, then hold for 3 days. Repeat every 7 days. 64 tablet 0   cetirizine (ZYRTEC) 10 MG chewable tablet Chew 10 mg by mouth daily as needed for allergies.     cyanocobalamin 1000 MCG tablet Take 1,000 mcg by mouth daily.     diphenoxylate -atropine  (LOMOTIL ) 2.5-0.025 MG tablet Take 1 tablet by mouth 4 (four) times daily as needed for diarrhea or loose stools. 30 tablet 0   Docusate Sodium (DSS) 100 MG CAPS Take 1 capsule by mouth daily.     ELIQUIS 5 MG TABS tablet Take 5 mg by mouth 2 (two) times daily.     gabapentin  (NEURONTIN ) 300 MG capsule TAKE 2 CAPSULES BY MOUTH IN THE MORNING AND 3 CAPSULES IN THE EVENING 450 capsule 0   Iron-Vitamin C (VITRON-C) 65-125 MG TABS Take 1 tablet by mouth daily.     lidocaine -prilocaine  (EMLA ) cream Apply small amount to port and cover with saran wrap 1-2 hours prior to port access 30 g 5   Loperamide HCl (IMODIUM PO) Take by mouth.     losartan (COZAAR) 50 MG tablet Take 50 mg by mouth daily.     magnesium chloride (SLOW-MAG) 64 MG TBEC SR tablet Take 1 tablet by mouth at bedtime.     melatonin (MELATONIN MAXIMUM STRENGTH) 5 MG TABS Take 1 tablet by mouth at bedtime as needed.     ondansetron  (ZOFRAN ) 8 MG tablet Take 1 tablet (8 mg total) by mouth every 8 (eight) hours as needed for nausea or vomiting. 20 tablet 1   Turmeric (QC TUMERIC COMPLEX PO) Take 1 capsule by mouth at bedtime.     No current facility-administered medications for this visit.   Facility-Administered Medications Ordered in Other Visits  Medication Dose Route Frequency Provider Last Rate Last Admin   fulvestrant  (FASLODEX ) injection 500 mg  500 mg Intramuscular Once Babara Call, MD       sodium  chloride flush (NS) 0.9 % injection 10 mL  10 mL Intravenous Once Babara Call, MD         PHYSICAL EXAMINATION: ECOG PERFORMANCE STATUS: 1 - Symptomatic  but completely ambulatory Vitals:   06/22/24 0837  BP: (!) 143/75  Pulse: 87  Resp: 19  Temp: 97.6 F (36.4 C)  SpO2: 99%    Filed Weights   06/22/24 0837  Weight: 198 lb 11.2 oz (90.1 kg)     Physical Exam Constitutional:      General: She is not in acute distress.    Appearance: She is not diaphoretic.  HENT:     Head: Normocephalic and atraumatic.     Nose: Nose normal.     Mouth/Throat:     Pharynx: No oropharyngeal exudate.  Eyes:     General: No scleral icterus.    Pupils: Pupils are equal, round, and reactive to light.  Cardiovascular:     Rate and Rhythm: Normal rate and regular rhythm.     Heart sounds: No murmur heard. Pulmonary:     Effort: Pulmonary effort is normal. No respiratory distress.     Comments: Mild decreased breath sound bilaterally.. L>R Abdominal:     General: There is no distension.     Palpations: Abdomen is soft.     Tenderness: There is no abdominal tenderness.  Musculoskeletal:        General: Normal range of motion.     Cervical back: Normal range of motion and neck supple.  Skin:    General: Skin is warm and dry.     Findings: No erythema.  Neurological:     Mental Status: She is alert and oriented to person, place, and time.     Cranial Nerves: No cranial nerve deficit.     Motor: No abnormal muscle tone.     Coordination: Coordination normal.  Psychiatric:        Mood and Affect: Affect normal.       LABORATORY DATA:  I have reviewed the data as listed     Latest Ref Rng & Units 06/22/2024    8:05 AM 05/25/2024    8:15 AM 04/27/2024    8:10 AM  CBC  WBC 4.0 - 10.5 K/uL 9.2  8.9  8.7   Hemoglobin 12.0 - 15.0 g/dL 88.7  88.3  88.5   Hematocrit 36.0 - 46.0 % 34.7  36.3  35.0   Platelets 150 - 400 K/uL 237  247  240       Latest Ref Rng & Units 06/22/2024    8:05 AM  05/25/2024    8:15 AM 04/27/2024    8:10 AM  CMP  Glucose 70 - 99 mg/dL 834  891  854   BUN 8 - 23 mg/dL 26  22  20    Creatinine 0.44 - 1.00 mg/dL 8.54  8.64  8.85   Sodium 135 - 145 mmol/L 137  140  135   Potassium 3.5 - 5.1 mmol/L 3.9  3.8  3.9   Chloride 98 - 111 mmol/L 106  107  104   CO2 22 - 32 mmol/L 24  25  25    Calcium  8.9 - 10.3 mg/dL 8.9  9.2  8.8   Total Protein 6.5 - 8.1 g/dL 6.9  7.2  6.8   Total Bilirubin 0.0 - 1.2 mg/dL 0.9  0.8  0.7   Alkaline Phos 38 - 126 U/L 72  76  79   AST 15 - 41 U/L 24  23  23    ALT 0 - 44 U/L 20  18  19       RADIOGRAPHIC STUDIES: I have personally reviewed the radiological images as listed and  agreed with the findings in the report. CT CHEST ABDOMEN PELVIS WO CONTRAST Result Date: 05/24/2024 CLINICAL DATA:  Metastatic breast cancer restaging, ongoing oral chemotherapy * Tracking Code: BO * EXAM: CT CHEST, ABDOMEN AND PELVIS WITHOUT CONTRAST TECHNIQUE: Multidetector CT imaging of the chest, abdomen and pelvis was performed following the standard protocol without IV contrast. RADIATION DOSE REDUCTION: This exam was performed according to the departmental dose-optimization program which includes automated exposure control, adjustment of the mA and/or kV according to patient size and/or use of iterative reconstruction technique. COMPARISON:  12/17/2023 FINDINGS: CT CHEST FINDINGS Cardiovascular: Right chest port catheter. Aortic atherosclerosis. Normal heart size. Three-vessel coronary artery calcifications. No pericardial effusion. Mediastinum/Nodes: No enlarged mediastinal, hilar, or axillary lymph nodes. Thyroid gland, trachea, and esophagus demonstrate no significant findings. Lungs/Pleura: Trace right pleural effusion, slightly diminished in volume. Mild paraseptal emphysema. Unchanged bandlike scarring of the lung bases. Musculoskeletal: Unchanged irregular mass in the upper outer left breast (series 2, image 20). Left axillary lymph node dissection.  No acute osseous findings. CT ABDOMEN PELVIS FINDINGS Hepatobiliary: Interval decrease in size of multiple hypodense liver metastases, index lesion in the right lobe measuring 0.9 x 0.8 cm, previously 2.2 x 1.8 cm (series 2, image 56). No gallstones, gallbladder wall thickening, or biliary dilatation. Pancreas: Unremarkable. No pancreatic ductal dilatation or surrounding inflammatory changes. Spleen: Normal in size without significant abnormality. Adrenals/Urinary Tract: Benign macroscopic fat containing bilateral adrenal adenomata, for which no further follow-up or characterization is required (series 2, image 56). Benign parapelvic cysts of the left kidney. No calculi or hydronephrosis. Bladder unremarkable. Stomach/Bowel: Stomach is within normal limits. Appendix appears normal. No evidence of bowel wall thickening, distention, or inflammatory changes. Colonic diverticulosis. Vascular/Lymphatic: Aortic atherosclerosis. No enlarged abdominal or pelvic lymph nodes. Reproductive: No mass or other abnormality. Other: No abdominal wall hernia or abnormality. No ascites. Musculoskeletal: No acute osseous findings. Unchanged widespread sclerotic osseous metastatic disease, notable for chronic wedge deformities of T7 and T10 as well as very extensive involvement of the sacrum. IMPRESSION: 1. Interval decrease in size of multiple hypodense liver metastases consistent with treatment response. 2. Unchanged widespread sclerotic osseous metastatic disease, notable for chronic wedge deformities of T7 and T10 as well as very extensive involvement of the sacrum. 3. Trace right pleural effusion, slightly diminished in volume. 4. Emphysema. 5. Coronary artery disease. Aortic Atherosclerosis (ICD10-I70.0) and Emphysema (ICD10-J43.9). Electronically Signed   By: Marolyn JONETTA Jaksch M.D.   On: 05/24/2024 16:16

## 2024-06-22 NOTE — Assessment & Plan Note (Signed)
 Encourage oral hydration and avoid nephrotoxins.  Could be side effects from Capivasertib 

## 2024-06-23 LAB — CANCER ANTIGEN 27.29: CA 27.29: 15.6 U/mL (ref 0.0–38.6)

## 2024-06-24 LAB — CANCER ANTIGEN 15-3: CA 15-3: 14.7 U/mL (ref 0.0–25.0)

## 2024-07-10 ENCOUNTER — Other Ambulatory Visit: Payer: Self-pay | Admitting: Oncology

## 2024-07-10 ENCOUNTER — Other Ambulatory Visit: Payer: Self-pay

## 2024-07-10 DIAGNOSIS — C50919 Malignant neoplasm of unspecified site of unspecified female breast: Secondary | ICD-10-CM

## 2024-07-10 MED ORDER — CAPIVASERTIB 200 MG PO TABS
400.0000 mg | ORAL_TABLET | Freq: Two times a day (BID) | ORAL | 0 refills | Status: DC
  Filled 2024-07-10: qty 64, 16d supply, fill #0
  Filled 2024-07-12: qty 64, 28d supply, fill #0

## 2024-07-12 ENCOUNTER — Other Ambulatory Visit (HOSPITAL_COMMUNITY): Payer: Self-pay

## 2024-07-12 ENCOUNTER — Other Ambulatory Visit: Payer: Self-pay

## 2024-07-12 NOTE — Progress Notes (Signed)
 Specialty Pharmacy Refill Coordination Note  Spoke with Amber Blevins  Amber Blevins is a 73 y.o. female contacted today regarding refills of specialty medication(s) Capivasertib  (TRUQAP )  Doses on hand: 10 days  Patient requested: Delivery   Delivery date: 07/17/24   Verified address: [address] [place] [address]  Medication will be filled on 07/14/24.

## 2024-07-13 ENCOUNTER — Other Ambulatory Visit: Payer: Self-pay

## 2024-07-20 ENCOUNTER — Inpatient Hospital Stay: Admitting: Oncology

## 2024-07-20 ENCOUNTER — Other Ambulatory Visit: Payer: Self-pay

## 2024-07-20 ENCOUNTER — Encounter: Payer: Self-pay | Admitting: Oncology

## 2024-07-20 ENCOUNTER — Inpatient Hospital Stay

## 2024-07-20 ENCOUNTER — Telehealth: Payer: Self-pay | Admitting: Pharmacy Technician

## 2024-07-20 ENCOUNTER — Inpatient Hospital Stay: Attending: Oncology

## 2024-07-20 VITALS — BP 146/84 | HR 76 | Temp 97.3°F | Resp 18 | Ht 64.0 in | Wt 196.7 lb

## 2024-07-20 DIAGNOSIS — Z170 Estrogen receptor positive status [ER+]: Secondary | ICD-10-CM | POA: Diagnosis not present

## 2024-07-20 DIAGNOSIS — C50919 Malignant neoplasm of unspecified site of unspecified female breast: Secondary | ICD-10-CM

## 2024-07-20 DIAGNOSIS — C7971 Secondary malignant neoplasm of right adrenal gland: Secondary | ICD-10-CM | POA: Insufficient documentation

## 2024-07-20 DIAGNOSIS — K521 Toxic gastroenteritis and colitis: Secondary | ICD-10-CM

## 2024-07-20 DIAGNOSIS — C50412 Malignant neoplasm of upper-outer quadrant of left female breast: Secondary | ICD-10-CM | POA: Diagnosis present

## 2024-07-20 DIAGNOSIS — G620 Drug-induced polyneuropathy: Secondary | ICD-10-CM

## 2024-07-20 DIAGNOSIS — C787 Secondary malignant neoplasm of liver and intrahepatic bile duct: Secondary | ICD-10-CM | POA: Insufficient documentation

## 2024-07-20 DIAGNOSIS — R7989 Other specified abnormal findings of blood chemistry: Secondary | ICD-10-CM | POA: Diagnosis not present

## 2024-07-20 DIAGNOSIS — Z87891 Personal history of nicotine dependence: Secondary | ICD-10-CM | POA: Diagnosis not present

## 2024-07-20 DIAGNOSIS — Z1732 Human epidermal growth factor receptor 2 negative status: Secondary | ICD-10-CM | POA: Diagnosis not present

## 2024-07-20 DIAGNOSIS — C7972 Secondary malignant neoplasm of left adrenal gland: Secondary | ICD-10-CM | POA: Insufficient documentation

## 2024-07-20 DIAGNOSIS — Z923 Personal history of irradiation: Secondary | ICD-10-CM | POA: Insufficient documentation

## 2024-07-20 DIAGNOSIS — C7951 Secondary malignant neoplasm of bone: Secondary | ICD-10-CM | POA: Diagnosis present

## 2024-07-20 DIAGNOSIS — Z1721 Progesterone receptor positive status: Secondary | ICD-10-CM | POA: Insufficient documentation

## 2024-07-20 DIAGNOSIS — Z5111 Encounter for antineoplastic chemotherapy: Secondary | ICD-10-CM | POA: Insufficient documentation

## 2024-07-20 DIAGNOSIS — Z79811 Long term (current) use of aromatase inhibitors: Secondary | ICD-10-CM | POA: Insufficient documentation

## 2024-07-20 DIAGNOSIS — T451X5A Adverse effect of antineoplastic and immunosuppressive drugs, initial encounter: Secondary | ICD-10-CM

## 2024-07-20 DIAGNOSIS — N189 Chronic kidney disease, unspecified: Secondary | ICD-10-CM

## 2024-07-20 LAB — CBC WITH DIFFERENTIAL (CANCER CENTER ONLY)
Abs Immature Granulocytes: 0.11 K/uL — ABNORMAL HIGH (ref 0.00–0.07)
Basophils Absolute: 0 K/uL (ref 0.0–0.1)
Basophils Relative: 0 %
Eosinophils Absolute: 0.3 K/uL (ref 0.0–0.5)
Eosinophils Relative: 4 %
HCT: 34.1 % — ABNORMAL LOW (ref 36.0–46.0)
Hemoglobin: 10.9 g/dL — ABNORMAL LOW (ref 12.0–15.0)
Immature Granulocytes: 2 %
Lymphocytes Relative: 16 %
Lymphs Abs: 1.1 K/uL (ref 0.7–4.0)
MCH: 28.5 pg (ref 26.0–34.0)
MCHC: 32 g/dL (ref 30.0–36.0)
MCV: 89 fL (ref 80.0–100.0)
Monocytes Absolute: 0.5 K/uL (ref 0.1–1.0)
Monocytes Relative: 7 %
Neutro Abs: 5 K/uL (ref 1.7–7.7)
Neutrophils Relative %: 71 %
Platelet Count: 238 K/uL (ref 150–400)
RBC: 3.83 MIL/uL — ABNORMAL LOW (ref 3.87–5.11)
RDW: 15.9 % — ABNORMAL HIGH (ref 11.5–15.5)
WBC Count: 7.1 K/uL (ref 4.0–10.5)
nRBC: 0 % (ref 0.0–0.2)

## 2024-07-20 LAB — CMP (CANCER CENTER ONLY)
ALT: 16 U/L (ref 0–44)
AST: 21 U/L (ref 15–41)
Albumin: 3.9 g/dL (ref 3.5–5.0)
Alkaline Phosphatase: 68 U/L (ref 38–126)
Anion gap: 8 (ref 5–15)
BUN: 21 mg/dL (ref 8–23)
CO2: 25 mmol/L (ref 22–32)
Calcium: 9 mg/dL (ref 8.9–10.3)
Chloride: 104 mmol/L (ref 98–111)
Creatinine: 1.52 mg/dL — ABNORMAL HIGH (ref 0.44–1.00)
GFR, Estimated: 36 mL/min — ABNORMAL LOW (ref >60)
Glucose, Bld: 179 mg/dL — ABNORMAL HIGH (ref 70–99)
Potassium: 3.7 mmol/L (ref 3.5–5.1)
Sodium: 137 mmol/L (ref 135–145)
Total Bilirubin: 0.8 mg/dL (ref 0.0–1.2)
Total Protein: 6.9 g/dL (ref 6.5–8.1)

## 2024-07-20 MED ORDER — FULVESTRANT 250 MG/5ML IM SOSY
500.0000 mg | PREFILLED_SYRINGE | Freq: Once | INTRAMUSCULAR | Status: AC
  Administered 2024-07-20: 500 mg via INTRAMUSCULAR
  Filled 2024-07-20: qty 10

## 2024-07-20 MED ORDER — ZOLEDRONIC ACID 4 MG/5ML IV CONC
3.0000 mg | Freq: Once | INTRAVENOUS | Status: AC
  Administered 2024-07-20: 3 mg via INTRAVENOUS
  Filled 2024-07-20: qty 3.75

## 2024-07-20 NOTE — Assessment & Plan Note (Signed)
 Chemotherapy plan as listed above

## 2024-07-20 NOTE — Progress Notes (Signed)
 Taking fulvestrant  shots, [facility] charges $50 a month.  Asking about help paying for shots.  Patient has already contacted [employer] thru [facility] and they connected you with [facility] ; needs to ask if this is a buy and bill pharmacy here, if we are she needs to find assistance elsewhere.

## 2024-07-20 NOTE — Assessment & Plan Note (Addendum)
 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  Labs are reviewed and discussed with patient. Continue fulvestrant  and Capivasertib  [400mg  Q12h on D1-4 of each week].  fulvestrant  monthly.  She tolerates well. Glucose is stable.  Repeat CT and MRI brain in Nov 2025

## 2024-07-20 NOTE — Assessment & Plan Note (Signed)
 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 Continue calcium  and vitamin D supplementation. 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

## 2024-07-20 NOTE — Progress Notes (Signed)
 Hematology/Oncology Progress note [phone] Fax:([phone]     CHIEF COMPLAINTS/REASON FOR VISIT:  metastatic breast caner ASSESSMENT & PLAN:   Cancer Staging  Primary malignant neoplasm of breast with metastasis (HCC) Staging form: Breast, AJCC 8th Edition - Clinical stage from 07/12/2021: Stage IV (rcTX, rcNX, rpM1, ER+, PR+, HER2-) - Signed by Babara Call, MD on 03/08/2022   Primary malignant neoplasm of breast with metastasis (HCC) Metastatic Breast cancer with extensive thoracic and bone involvement, in visceral crisis, ER+, 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  Labs are reviewed and discussed with patient. Continue fulvestrant  and Capivasertib  [400mg  Q12h on D1-4 of each week].  fulvestrant  monthly.  She tolerates well. Glucose is stable.  Repeat CT and MRI brain in Nov 2025      Chemotherapy induced diarrhea Stable symptoms Imodium PRN  Chemotherapy-induced neuropathy #Grade 2 neuropathy, continue gabapentin ,  Continue gabapentin  600 mg in a.m.,night time  900 mg  Previously declined acupuncture    Elevated serum creatinine Encourage oral hydration and avoid nephrotoxins.  Could be side effects from Capivasertib  Refer to nephrology  Encounter for antineoplastic chemotherapy Chemotherapy plan as listed above   Metastasis to bone [facility] 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 Continue calcium  and vitamin D supplementation. [name]  MRI brain showed enlarging right frontal calvarium - s/p RT  Zometa  monthly now, decrease dose to  3mg  consider eventually increase interval to Q3 months in the future         Orders Placed This Encounter  Procedures   CT CHEST ABDOMEN PELVIS WO CONTRAST    Standing Status:   Future    Expected Date:   08/10/2024    Expiration Date:   07/20/2025    Preferred imaging location?:   [facility]    If indicated for the ordered procedure, I authorize the administration of oral contrast media per Radiology protocol:   Yes    Does the patient have a contrast media/X-ray dye allergy?:   No   MR Brain W Wo Contrast    Standing Status:   Future    Expected Date:   08/10/2024    Expiration Date:   07/20/2025    If indicated for the ordered procedure, I authorize the administration of contrast media per Radiology protocol:   Yes    What is the patient's sedation requirement?:   No Sedation    Does the patient have a pacemaker or implanted devices?:   No    Use SRS Protocol?:   No    Preferred imaging location?:   [facility] (table limit - 500lbs)   Cancer antigen 27.29    Standing Status:   Future    Expected Date:   11/20/2024    Expiration Date:   02/18/2025   Cancer antigen 15-3    Standing Status:   Future    Expected Date:   11/20/2024    Expiration Date:   02/18/2025   CMP (Cancer Center only)    Standing Status:   Future    Expected Date:   11/20/2024    Expiration Date:   02/18/2025   CBC with  Differential (Cancer Center Only)    Standing Status:   Future    Expected Date:   11/20/2024    Expiration Date:   02/18/2025    Follow up 4 weeks lab MD  All questions were answered. The patient knows to call the clinic with any problems, questions or concerns.  Zelphia Cap, MD, PhD [facility] Hematology Oncology 07/20/2024      HISTORY OF PRESENTING ILLNESS:   ALIYA Blevins is a  73 y.o.  female with PMH listed below presents for follow up for treatment of metastatic breast cancer.   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 Additional metastatic involvement T12, L1 through L3.  No primary tumor site identified. Top differential considerations are Metastatic Disease Unknown primary, less likely Lymphoma or Multiple Myeloma.   2. Superimposed lumbar spine degeneration, including degenerative moderate to severe left L3 and L4 nerve level impingement from disc herniation.      07/02/2021 Imaging   CT chest abdomen pelvis showed innumerable small pulmonary and pleural nodules consistent with diffuse metastasis.  Associated with probable malignant pleural effusion.  Mediastinal and hilar lymphadenopathy consistent with metastatic disease.  Hepatic and bilateral adrenal gland metastasis.  Diffuse extensive destructive metastatic bone disease involving pelvis.   07/12/2021 Cancer Staging   Staging form: Breast, AJCC 8th Edition - Clinical stage from 07/12/2021: Stage IV (rcTX, cNX, pM1, ER+, PR+, HER2-) - Signed by [name], MD on 03/08/2022 Stage prefix: Recurrence   07/17/2021 Initial Diagnosis   Metastatic breast cancer -history of breast cancer, diagnosed in 2010 Left-sided T2 (2.4cm) N0, ER/PR positive, HER2 negative IDC of the breast, s/p lumpectomy by Dr.Meyer at [facility]. Oncotype score of 11 who completed radiation and she finished 10 years of Femara  from 05/2009 and stopped in 2020.  -07/12/2021 patient underwent right thoracentesis.  Cytology was  positive for metastatic carcinoma, compatible with breast origin.  ER/PR +, HER2 negative -07/16/2021, patient underwent iliac bone biopsy.  Positive for metastatic carcinoma.   07/21/2021 - 09/16/2021 Chemotherapy   Patient is on Treatment Plan : BREAST Paclitaxel  D1,8,15 q28d     09/04/2021 Imaging   CT chest abdomen pelvis showed improvement of the pulmonary nodularity and thickening in the left and the right lung.  Persistent nodular interstitial and pleural thickening remains.  Improvement of mediastinal and hilar adenopathy, hepatic metastatic lesions are similar.  Adrenal gland metastasis is similar.  Interval increase in the sclerotic bone metastasis.    09/30/2021 -  Chemotherapy   started on abemaciclib  100 mg twice daily and letrozole .   02/24/2022 Imaging    CT chest abdomen pelvis with contrast showed unchanged bilateral pleural effusion with associated intralobular septal thickening of the lung bases.  Without discrete nodularity consistent with stable appearance of treated pleural and lymphangitic metastatic disease.  Stable or minimally diminished subcentimeter liver lesions.  Unchanged left adrenal metastasis.  Unchanged widespread sclerotic osseous metastatic disease involving  the included axial and appendicular skeleton.  No evidence of new metastatic disease in the chest abdomen or pelvis.  Coronary artery disease.   03/06/2022 Procedure   Ultrasound guided thoracentesis, removal of pleural fluid.    05/29/2022 Imaging   CT chest abdomen pelvis 1. Stable CT of the chest, abdomen and pelvis. 2. Unchanged appearance of bilateral pleural effusions, pleural nodularity, and interlobular septal thickening compatible with pleural and lymphangitic metastatic disease. 3. Stable appearance of multifocal sclerotic bone metastases. 4. Multifocal low-attenuation liver lesions are unchanged in the interval. 5. Stable appearance of left adrenal gland metastases. 6. Stable appearance of  small pericardial effusion.7. No new or progressive disease identified.8. Aortic Atherosclerosis (ICD10-I70.0).   06/06/2022 Imaging   Bone scan Constellation of findings are consistent with multifocal osseous metastatic disease throughout the axial and appendicular skeleton.Of note, osseous metastatic disease involves the RIGHT greater than LEFT femur. Consider evaluation with dedicated hip and femur radiographs to evaluate for overall disease burden and potential risk for pathologic fracture.   08/31/2022 Imaging   CT chest abdomen pelvis w contrast 1. Unchanged small right, trace left pleural effusions with associated diffuse interlobular septal thickening and fine fissural and perilymphatic nodularity throughout the lungs, findings consistent with lymphangitic metastatic disease. 2. Stable or slightly diminished very subtle hypodense lesion of the central right lobe of the liver, hepatic segment VIII measuring 0.6 cm, previously 0.8 cm. Additional unchanged, very subtle hypodense lesion of hepatic segment IV B measuring 1.1 cm. 3. Unchanged widespread sclerotic osseous metastatic disease throughout the included axial and proximal appendicular skeleton, particularly dense in the lower lumbar spine and sacrum. 4. Unchanged left adrenal nodule measuring 1.7 x 1.1 cm. 5. Overall constellation of findings is consistent with stable metastatic disease. 6. Coronary artery disease.    12/01/2022 Miscellaneous   [facility] liquid biopsy - PIK3CA mutation. 0.1% Not enough tissue for tissue NGS   03/08/2023 Imaging   Bone scan showed Multifocal osseous metastatic disease. One new focus along the right side of the skull   08/23/2023 - 09/06/2023 Radiation Therapy   Skull radiation.    12/24/2023 Imaging   CT chest abdomen pelvis wo contrast showed 1. 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 2. New indistinct 0.6 cm medial right lower lobe pulmonary nodule, indeterminate for inflammatory  nodule versus pulmonary metastasis. Suggest attention on follow-up chest CT in 3 months. 3. Widespread patchy sclerotic osseous metastatic disease, not appreciably changed. 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 4. Stable small layering right pleural effusion. Stable small pericardial effusion. 5. Three-vessel coronary atherosclerosis. 6. Long-term stability of left adrenal nodule favoring an adenoma. 7. Moderate left colonic diverticulosis. 8.  Aortic Atherosclerosis (ICD10-I70.0).     Procedure   Liver biopsy Pathology showed metasatic adenocarcinoma Immunohistochemical stains show that the tumor cells are positive for CK7 and GATA3 while they are negative for CK20 and CDX2, consistent with patient's clinical history of primary breast carcinoma.     ER 95%, PR 0%, HER2 low (1+), Ki 67 5% NGS showed PIK3CA mutation, CHEK mutation.    02/07/2024 -  Chemotherapy   Started on Capivasertib  400mg  Q12h on D1-4 of each week   05/18/2024 Imaging   CT chest abdomen pelvis w contrast   1. Interval decrease in size of multiple hypodense liver metastases consistent with treatment response. 2. Unchanged widespread sclerotic osseous metastatic disease, notable for chronic wedge deformities of T7 and T10 as well as very extensive involvement of the sacrum. 3. Trace  right pleural effusion, slightly diminished in volume. 4. Emphysema. 5. Coronary artery disease.   Aortic Atherosclerosis (ICD10-I70.0) and Emphysema (ICD10-J43.9).   Metastasis to bone (HCC)  02/04/2022 Initial Diagnosis   Metastasis to bone [facility])    Patient follows up with cardiology for evaluation of bradycardia.  Cardiology notes reviewed.  72-hour Holter monitor revealed revealed predominant sinus rhythm with mean heart rate of 96 bpm, heart rate range 53 to 129 bpm, frequent premature ventricular contractions (27% burden), and intermittent atrial fibrillation (5% burden) with longest episode  3 hours. CHA2DS2-VASc score of 3, per cardiology recommendation patient was started on Eliquis 5 mg twice daily.     INTERVAL HISTORY Amber Blevins is a 73 y.o. female who has above history reviewed by me today presents for follow up visit for metastatic breast cancer Occasional back and hip pain..   No Fever or chills. Denies abdominal pain,nausea or vomiting.  Currently on Capivasertib  400mg  Q12h on D1-4 of each week. So far she tolerates well.  Facial rash has resolved.    .  Review of Systems  Constitutional:  Negative for appetite change, chills, fatigue and fever.  HENT:   Negative for hearing loss and voice change.   Eyes:  Negative for eye problems.  Respiratory:  Negative for chest tightness, cough and shortness of breath.   Cardiovascular:  Negative for chest pain.  Gastrointestinal:  Negative for abdominal distention, abdominal pain and blood in stool.  Endocrine: Negative for hot flashes.  Genitourinary:  Negative for difficulty urinating and frequency.   Musculoskeletal:  Negative for arthralgias.  Skin:  Negative for itching and rash.  Neurological:  Positive for numbness. Negative for extremity weakness.  Hematological:  Negative for adenopathy.  Psychiatric/Behavioral:  Negative for confusion.      MEDICAL HISTORY:  Past Medical History:  Diagnosis Date   A-fib (HCC)    Breast cancer (HCC)    COPD (chronic obstructive pulmonary disease) (HCC)    Family history of adverse reaction to anesthesia    brother has problem with coming out of anesthesia   High cholesterol    Hypertension    Personal history of radiation therapy    Pre-diabetes     SURGICAL HISTORY: Past Surgical History:  Procedure Laterality Date   BREAST LUMPECTOMY Left    2010   COLONOSCOPY     EYE SURGERY Left    Cataract surgery   PORTACATH PLACEMENT N/A 08/01/2021   Procedure: INSERTION PORT-A-CATH;  Surgeon: Rodolph Romano, MD;  Location: [facility] ORS;  Service: General;   Laterality: N/A;   thorocentesis Right 07/10/2021   and another on 07/25/21    SOCIAL HISTORY: Social History   Socioeconomic History   Marital status: Married    Spouse name: Not on file   Number of children: Not on file   Years of education: Not on file   Highest education level: Not on file  Occupational History   Not on file  Tobacco Use   Smoking status: Former    Current packs/day: 0.00    Average packs/day: 1 pack/day for 25.0 years (25.0 ttl pk-yrs)    Types: Cigarettes    Start date: 13    Quit date: 61    Years since quitting: 35.8   Smokeless tobacco: Never  Vaping Use   Vaping status: Never Used  Substance and Sexual Activity   Alcohol use: Not Currently   Drug use: Never   Sexual activity: Not on file  Other Topics Concern  Not on file  Social History Narrative   Not on file   Social Drivers of Health   Financial Resource Strain: Low Risk  (07/26/2023)   Received from [facility]   Overall Financial Resource Strain (CARDIA)    Difficulty of Paying Living Expenses: Not hard at all  Food Insecurity: No Food Insecurity (07/26/2023)   Received from [facility]   Hunger Vital Sign    Within the past 12 months, you worried that your food would run out before you got the money to buy more.: Never true    Within the past 12 months, the food you bought just didn't last and you didn't have money to get more.: Never true  Transportation Needs: No Transportation Needs (07/26/2023)   Received from [facility] - Transportation    In the past 12 months, has lack of transportation kept you from medical appointments or from getting medications?: No    Lack of Transportation (Non-Medical): No  Physical Activity: Not on file  Stress: Not on file  Social Connections: Not on file  Intimate Partner Violence: Not on file    FAMILY HISTORY: Family History  Problem Relation Age of Onset   Ovarian  cancer Mother 30   Lung cancer Mother 78   Diabetes Father    Heart disease Father    Parkinson's disease Father    Bladder Cancer Brother 49   Pulmonary disease Brother    Rheumatic fever Brother    Prostate cancer Cousin 55   Prostate cancer Cousin 89   Breast cancer Neg Hx     ALLERGIES:  is allergic to green tea (camellia sinensis), melaleuca viridiflora, tea tree oil, and lisinopril.  MEDICATIONS:  Current Outpatient Medications  Medication Sig Dispense Refill   acetaminophen  (TYLENOL ) 650 MG CR tablet Take 650 mg by mouth every 8 (eight) hours as needed for pain.     atorvastatin  (LIPITOR) 40 MG tablet Take 40 mg by mouth at bedtime.     Boswellia-Glucosamine-Vit D (OSTEO BI-FLEX-GLUCOS/5-LOXIN) TABS Take 1 capsule by mouth in the morning and at bedtime.     Calcium  Carb-Cholecalciferol 600-400 MG-UNIT TABS Take 1 tablet by mouth 2 (two) times daily.     capivasertib  (TRUQAP ) 200 MG tablet Take 2 tablets (400 mg total) by mouth 2 (two) times daily. Take for 4 days, then hold for 3 days. Repeat every 7 days. 64 tablet 0   cetirizine (ZYRTEC) 10 MG chewable tablet Chew 10 mg by mouth daily as needed for allergies.     cyanocobalamin 1000 MCG tablet Take 1,000 mcg by mouth daily.     diphenoxylate -atropine  (LOMOTIL ) 2.5-0.025 MG tablet Take 1 tablet by mouth 4 (four) times daily as needed for diarrhea or loose stools. 30 tablet 0   Docusate Sodium (DSS) 100 MG CAPS Take 1 capsule by mouth daily. (Patient taking differently: Take 1 capsule by mouth as needed.)     ELIQUIS 5 MG TABS tablet Take 5 mg by mouth 2 (two) times daily.     gabapentin  (NEURONTIN ) 300 MG capsule TAKE 2 CAPSULES BY MOUTH IN THE MORNING AND 3 CAPSULES IN THE EVENING 450 capsule 0   Iron-Vitamin C (VITRON-C) 65-125 MG TABS Take 1 tablet by mouth daily.     lidocaine -prilocaine  (EMLA ) cream Apply small amount to port and cover with saran wrap 1-2 hours prior to port access 30 g 5   Loperamide HCl (IMODIUM PO) Take  by mouth.  losartan (COZAAR) 50 MG tablet Take 50 mg by mouth daily.     magnesium chloride (SLOW-MAG) 64 MG TBEC SR tablet Take 1 tablet by mouth at bedtime.     melatonin (MELATONIN MAXIMUM STRENGTH) 5 MG TABS Take 1 tablet by mouth at bedtime as needed.     ondansetron  (ZOFRAN ) 8 MG tablet Take 1 tablet (8 mg total) by mouth every 8 (eight) hours as needed for nausea or vomiting. 20 tablet 1   Turmeric (QC TUMERIC COMPLEX PO) Take 1 capsule by mouth at bedtime.     No current facility-administered medications for this visit.   Facility-Administered Medications Ordered in Other Visits  Medication Dose Route Frequency Provider Last Rate Last Admin   fulvestrant  (FASLODEX ) injection 500 mg  500 mg Intramuscular Once Babara Call, MD       sodium chloride  flush (NS) 0.9 % injection 10 mL  10 mL Intravenous Once Babara Call, MD         PHYSICAL EXAMINATION: ECOG PERFORMANCE STATUS: 1 - Symptomatic but completely ambulatory Vitals:   07/20/24 0840 07/20/24 0846  BP: (!) 157/78 (!) 146/84  Pulse: 92 76  Resp: 18   Temp: (!) 97.3 F (36.3 C)   SpO2: 98% 99%    Filed Weights   07/20/24 0840  Weight: 196 lb 11.2 oz (89.2 kg)     Physical Exam Constitutional:      General: She is not in acute distress.    Appearance: She is not diaphoretic.  HENT:     Head: Normocephalic and atraumatic.     Nose: Nose normal.     Mouth/Throat:     Pharynx: No oropharyngeal exudate.  Eyes:     General: No scleral icterus.    Pupils: Pupils are equal, round, and reactive to light.  Cardiovascular:     Rate and Rhythm: Normal rate and regular rhythm.     Heart sounds: No murmur heard. Pulmonary:     Effort: Pulmonary effort is normal. No respiratory distress.     Comments: Mild decreased breath sound bilaterally.. L>R Abdominal:     General: There is no distension.     Palpations: Abdomen is soft.     Tenderness: There is no abdominal tenderness.  Musculoskeletal:        General: Normal  range of motion.     Cervical back: Normal range of motion and neck supple.  Skin:    General: Skin is warm and dry.     Findings: No erythema.  Neurological:     Mental Status: She is alert and oriented to person, place, and time.     Cranial Nerves: No cranial nerve deficit.     Motor: No abnormal muscle tone.     Coordination: Coordination normal.  Psychiatric:        Mood and Affect: Affect normal.       LABORATORY DATA:  I have reviewed the data as listed     Latest Ref Rng & Units 07/20/2024    8:19 AM 06/22/2024    8:05 AM 05/25/2024    8:15 AM  CBC  WBC 4.0 - 10.5 K/uL 7.1  9.2  8.9   Hemoglobin 12.0 - 15.0 g/dL 89.0  88.7  88.3   Hematocrit 36.0 - 46.0 % 34.1  34.7  36.3   Platelets 150 - 400 K/uL 238  237  247       Latest Ref Rng & Units 07/20/2024    8:19 AM 06/22/2024  8:05 AM 05/25/2024    8:15 AM  CMP  Glucose 70 - 99 mg/dL 820  834  891   BUN 8 - 23 mg/dL 21  26  22    Creatinine 0.44 - 1.00 mg/dL 8.47  8.54  8.64   Sodium 135 - 145 mmol/L 137  137  140   Potassium 3.5 - 5.1 mmol/L 3.7  3.9  3.8   Chloride 98 - 111 mmol/L 104  106  107   CO2 22 - 32 mmol/L 25  24  25    Calcium  8.9 - 10.3 mg/dL 9.0  8.9  9.2   Total Protein 6.5 - 8.1 g/dL 6.9  6.9  7.2   Total Bilirubin 0.0 - 1.2 mg/dL 0.8  0.9  0.8   Alkaline Phos 38 - 126 U/L 68  72  76   AST 15 - 41 U/L 21  24  23    ALT 0 - 44 U/L 16  20  18       RADIOGRAPHIC STUDIES: I have personally reviewed the radiological images as listed and agreed with the findings in the report. CT CHEST ABDOMEN PELVIS WO CONTRAST Result Date: 05/24/2024 CLINICAL DATA:  Metastatic breast cancer restaging, ongoing oral chemotherapy * Tracking Code: BO * EXAM: CT CHEST, ABDOMEN AND PELVIS WITHOUT CONTRAST TECHNIQUE: Multidetector CT imaging of the chest, abdomen and pelvis was performed following the standard protocol without IV contrast. RADIATION DOSE REDUCTION: This exam was performed according to the departmental  dose-optimization program which includes automated exposure control, adjustment of the mA and/or kV according to patient size and/or use of iterative reconstruction technique. COMPARISON:  12/17/2023 FINDINGS: CT CHEST FINDINGS Cardiovascular: Right chest port catheter. Aortic atherosclerosis. Normal heart size. Three-vessel coronary artery calcifications. No pericardial effusion. Mediastinum/Nodes: No enlarged mediastinal, hilar, or axillary lymph nodes. Thyroid gland, trachea, and esophagus demonstrate no significant findings. Lungs/Pleura: Trace right pleural effusion, slightly diminished in volume. Mild paraseptal emphysema. Unchanged bandlike scarring of the lung bases. Musculoskeletal: Unchanged irregular mass in the upper outer left breast (series 2, image 20). Left axillary lymph node dissection. No acute osseous findings. CT ABDOMEN PELVIS FINDINGS Hepatobiliary: Interval decrease in size of multiple hypodense liver metastases, index lesion in the right lobe measuring 0.9 x 0.8 cm, previously 2.2 x 1.8 cm (series 2, image 56). No gallstones, gallbladder wall thickening, or biliary dilatation. Pancreas: Unremarkable. No pancreatic ductal dilatation or surrounding inflammatory changes. Spleen: Normal in size without significant abnormality. Adrenals/Urinary Tract: Benign macroscopic fat containing bilateral adrenal adenomata, for which no further follow-up or characterization is required (series 2, image 56). Benign parapelvic cysts of the left kidney. No calculi or hydronephrosis. Bladder unremarkable. Stomach/Bowel: Stomach is within normal limits. Appendix appears normal. No evidence of bowel wall thickening, distention, or inflammatory changes. Colonic diverticulosis. Vascular/Lymphatic: Aortic atherosclerosis. No enlarged abdominal or pelvic lymph nodes. Reproductive: No mass or other abnormality. Other: No abdominal wall hernia or abnormality. No ascites. Musculoskeletal: No acute osseous findings.  Unchanged widespread sclerotic osseous metastatic disease, notable for chronic wedge deformities of T7 and T10 as well as very extensive involvement of the sacrum. IMPRESSION: 1. Interval decrease in size of multiple hypodense liver metastases consistent with treatment response. 2. Unchanged widespread sclerotic osseous metastatic disease, notable for chronic wedge deformities of T7 and T10 as well as very extensive involvement of the sacrum. 3. Trace right pleural effusion, slightly diminished in volume. 4. Emphysema. 5. Coronary artery disease. Aortic Atherosclerosis (ICD10-I70.0) and Emphysema (ICD10-J43.9). Electronically Signed   By: Alex D  Marlyce M.D.   On: 05/24/2024 16:16

## 2024-07-20 NOTE — Assessment & Plan Note (Signed)
#  Grade 2 neuropathy, continue gabapentin ,  Continue gabapentin  600 mg in a.m.,night time  900 mg  Previously declined acupuncture

## 2024-07-20 NOTE — Assessment & Plan Note (Signed)
 Encourage oral hydration and avoid nephrotoxins.  Could be side effects from Capivasertib  Refer to nephrology

## 2024-07-20 NOTE — Assessment & Plan Note (Signed)
 Stable symptoms Imodium PRN

## 2024-07-20 NOTE — Patient Instructions (Signed)

## 2024-07-20 NOTE — Telephone Encounter (Signed)
 Spoke with patient regarding assistance for the medication Fulvestrant .  Explained that there is currently no patient assistance program for the medication, only a copay assistance program, which the patient does not qualify for because she has Medicare.    Patient does not receive food stamps or any type of governmental assistance so she does not qualify for the [employer] or [employer].  Contacted [facility].  There is currently no funding available for Metastatic Breast Cancer.  I made patient aware.  Patient indicated that she may be receiving assistance for the Fulvestrant  through [employer], a program that is connected to her [facility].  She should find out within the next couple of weeks if she is approved.   Patient indicated that she can afford the $50 copay for the Fulvestrant , just prefers not to have to pay it if she can qualify for some type of financial assistance.  If she does not qualify for assistance through [employer], then the patient will give me a call.  Dickey Amber Blevins Patient Pharmacologist [facility]

## 2024-07-21 ENCOUNTER — Telehealth: Payer: Self-pay

## 2024-07-21 LAB — CANCER ANTIGEN 15-3: CA 15-3: 14.7 U/mL (ref 0.0–25.0)

## 2024-07-21 LAB — CANCER ANTIGEN 27.29: CA 27.29: 19.9 U/mL (ref 0.0–38.6)

## 2024-07-21 NOTE — Telephone Encounter (Signed)
 Nephrology referral faxed to [facility]. Re: CKD. Fax confirmation received.

## 2024-07-27 ENCOUNTER — Other Ambulatory Visit: Payer: Self-pay

## 2024-07-27 NOTE — Progress Notes (Signed)
 Specialty Pharmacy Ongoing Clinical Assessment Note  Amber Blevins is a 73 y.o. female who is being followed by the specialty pharmacy service for RxSp Oncology   Patient's specialty medication(s) reviewed today: Capivasertib  (TRUQAP )   Missed doses in the last 4 weeks: 0   Patient/Caregiver did not have any additional questions or concerns.   Therapeutic benefit summary: Patient is achieving benefit   Adverse events/side effects summary: No adverse events/side effects   Patient's therapy is appropriate to: Continue    Goals Addressed             This Visit's Progress    Slow Disease Progression   On track    Patient is on track. Patient will maintain adherence. Per OV 10/23, labs are stable. CT in August showed partial response to treatment          Follow up: 3 months  [facility] Specialty Pharmacist

## 2024-08-01 NOTE — Telephone Encounter (Signed)
 Pt scheduled to see Dr. Marcelino on 11/6

## 2024-08-03 ENCOUNTER — Telehealth: Payer: Self-pay | Admitting: Pharmacy Technician

## 2024-08-03 ENCOUNTER — Encounter: Payer: Self-pay | Admitting: Oncology

## 2024-08-03 ENCOUNTER — Other Ambulatory Visit (HOSPITAL_COMMUNITY): Payer: Self-pay

## 2024-08-03 ENCOUNTER — Other Ambulatory Visit: Payer: Self-pay | Admitting: Nephrology

## 2024-08-03 DIAGNOSIS — N179 Acute kidney failure, unspecified: Secondary | ICD-10-CM

## 2024-08-03 DIAGNOSIS — N1831 Chronic kidney disease, stage 3a: Secondary | ICD-10-CM

## 2024-08-03 NOTE — Telephone Encounter (Signed)
 Oral Oncology Patient Advocate Encounter  Was successful in securing patient a $7500 grant from [facility] to provide copayment coverage for Truqap .  This will keep the out of pocket expense at $0.     [employer] ID: [id number]   The billing information is as follows and has been shared with [facility].    RxBin: [id number] PCN: [id number] Member ID: [id number] Group ID: [id number] Dates of Eligibility: 07/04/2024 through 07/03/2025  Fund:  Breast Cancer - Medicare Access  Zurich (Patty) Chet Burnet, CPhT  [facility], Zelda Salmon, [facility] - Oral Chemotherapy Patient Advocate Specialist III Phone: [phone]  Fax: [phone]

## 2024-08-04 ENCOUNTER — Other Ambulatory Visit: Payer: Self-pay

## 2024-08-04 ENCOUNTER — Other Ambulatory Visit: Payer: Self-pay | Admitting: Oncology

## 2024-08-04 ENCOUNTER — Other Ambulatory Visit (HOSPITAL_COMMUNITY): Payer: Self-pay

## 2024-08-04 DIAGNOSIS — C50919 Malignant neoplasm of unspecified site of unspecified female breast: Secondary | ICD-10-CM

## 2024-08-04 MED ORDER — CAPIVASERTIB 200 MG PO TABS
400.0000 mg | ORAL_TABLET | Freq: Two times a day (BID) | ORAL | 0 refills | Status: DC
  Filled 2024-08-04: qty 64, 28d supply, fill #0

## 2024-08-04 NOTE — Progress Notes (Signed)
 Specialty Pharmacy Refill Coordination Note  Amber Blevins is a 73 y.o. female contacted today regarding refills of specialty medication(s) Capivasertib  (TRUQAP )   Patient requested Delivery   Delivery date: 08/11/24   Verified address: [address] [facility] [address]   Medication will be filled on: 08/10/24

## 2024-08-09 ENCOUNTER — Other Ambulatory Visit: Payer: Self-pay

## 2024-08-09 DIAGNOSIS — N189 Chronic kidney disease, unspecified: Secondary | ICD-10-CM

## 2024-08-09 DIAGNOSIS — C50919 Malignant neoplasm of unspecified site of unspecified female breast: Secondary | ICD-10-CM

## 2024-08-10 ENCOUNTER — Ambulatory Visit
Admission: RE | Admit: 2024-08-10 | Discharge: 2024-08-10 | Disposition: A | Discharge status: Home / Self Care | Source: Ambulatory Visit | Attending: Oncology | Admitting: Oncology

## 2024-08-10 DIAGNOSIS — C50919 Malignant neoplasm of unspecified site of unspecified female breast: Secondary | ICD-10-CM

## 2024-08-10 DIAGNOSIS — C7951 Secondary malignant neoplasm of bone: Secondary | ICD-10-CM | POA: Insufficient documentation

## 2024-08-10 DIAGNOSIS — D3502 Benign neoplasm of left adrenal gland: Secondary | ICD-10-CM | POA: Insufficient documentation

## 2024-08-10 DIAGNOSIS — I700 Atherosclerosis of aorta: Secondary | ICD-10-CM | POA: Diagnosis not present

## 2024-08-10 DIAGNOSIS — J439 Emphysema, unspecified: Secondary | ICD-10-CM | POA: Insufficient documentation

## 2024-08-10 MED ORDER — HEPARIN SOD (PORK) LOCK FLUSH 100 UNIT/ML IV SOLN
500.0000 [IU] | Freq: Once | INTRAVENOUS | Status: AC
  Administered 2024-08-10: 500 [IU] via INTRAVENOUS
  Filled 2024-08-10: qty 5

## 2024-08-10 MED ORDER — GADOBUTROL 1 MMOL/ML IV SOLN
8.0000 mL | Freq: Once | INTRAVENOUS | Status: AC | PRN
  Administered 2024-08-10: 8 mL via INTRAVENOUS

## 2024-08-10 MED ORDER — HEPARIN SOD (PORK) LOCK FLUSH 100 UNIT/ML IV SOLN
INTRAVENOUS | Status: AC
  Filled 2024-08-10: qty 5

## 2024-08-14 ENCOUNTER — Encounter: Payer: Self-pay | Admitting: Physician Assistant

## 2024-08-17 ENCOUNTER — Inpatient Hospital Stay: Attending: Oncology

## 2024-08-17 ENCOUNTER — Inpatient Hospital Stay

## 2024-08-17 ENCOUNTER — Encounter: Payer: Self-pay | Admitting: Oncology

## 2024-08-17 ENCOUNTER — Ambulatory Visit
Admission: RE | Admit: 2024-08-17 | Discharge: 2024-08-17 | Disposition: A | Discharge status: Home / Self Care | Source: Ambulatory Visit | Attending: Nephrology | Admitting: Nephrology

## 2024-08-17 ENCOUNTER — Inpatient Hospital Stay: Admitting: Oncology

## 2024-08-17 VITALS — BP 137/75 | HR 87 | Temp 97.7°F | Resp 18 | Wt 198.9 lb

## 2024-08-17 DIAGNOSIS — Z170 Estrogen receptor positive status [ER+]: Secondary | ICD-10-CM | POA: Insufficient documentation

## 2024-08-17 DIAGNOSIS — K521 Toxic gastroenteritis and colitis: Secondary | ICD-10-CM

## 2024-08-17 DIAGNOSIS — Z923 Personal history of irradiation: Secondary | ICD-10-CM | POA: Diagnosis not present

## 2024-08-17 DIAGNOSIS — N189 Chronic kidney disease, unspecified: Secondary | ICD-10-CM

## 2024-08-17 DIAGNOSIS — G620 Drug-induced polyneuropathy: Secondary | ICD-10-CM

## 2024-08-17 DIAGNOSIS — Z79899 Other long term (current) drug therapy: Secondary | ICD-10-CM | POA: Diagnosis not present

## 2024-08-17 DIAGNOSIS — N1831 Chronic kidney disease, stage 3a: Secondary | ICD-10-CM | POA: Diagnosis present

## 2024-08-17 DIAGNOSIS — N179 Acute kidney failure, unspecified: Secondary | ICD-10-CM | POA: Insufficient documentation

## 2024-08-17 DIAGNOSIS — C50919 Malignant neoplasm of unspecified site of unspecified female breast: Secondary | ICD-10-CM | POA: Diagnosis not present

## 2024-08-17 DIAGNOSIS — C50412 Malignant neoplasm of upper-outer quadrant of left female breast: Secondary | ICD-10-CM | POA: Diagnosis present

## 2024-08-17 DIAGNOSIS — C7951 Secondary malignant neoplasm of bone: Secondary | ICD-10-CM

## 2024-08-17 DIAGNOSIS — Z5111 Encounter for antineoplastic chemotherapy: Secondary | ICD-10-CM | POA: Diagnosis present

## 2024-08-17 DIAGNOSIS — T451X5A Adverse effect of antineoplastic and immunosuppressive drugs, initial encounter: Secondary | ICD-10-CM | POA: Insufficient documentation

## 2024-08-17 DIAGNOSIS — C787 Secondary malignant neoplasm of liver and intrahepatic bile duct: Secondary | ICD-10-CM | POA: Insufficient documentation

## 2024-08-17 DIAGNOSIS — R21 Rash and other nonspecific skin eruption: Secondary | ICD-10-CM | POA: Diagnosis not present

## 2024-08-17 DIAGNOSIS — R7989 Other specified abnormal findings of blood chemistry: Secondary | ICD-10-CM | POA: Diagnosis not present

## 2024-08-17 DIAGNOSIS — Z79811 Long term (current) use of aromatase inhibitors: Secondary | ICD-10-CM | POA: Diagnosis not present

## 2024-08-17 LAB — CMP (CANCER CENTER ONLY)
ALT: 18 U/L (ref 0–44)
AST: 22 U/L (ref 15–41)
Albumin: 3.8 g/dL (ref 3.5–5.0)
Alkaline Phosphatase: 67 U/L (ref 38–126)
Anion gap: 9 (ref 5–15)
BUN: 17 mg/dL (ref 8–23)
CO2: 25 mmol/L (ref 22–32)
Calcium: 9.1 mg/dL (ref 8.9–10.3)
Chloride: 105 mmol/L (ref 98–111)
Creatinine: 1.4 mg/dL — ABNORMAL HIGH (ref 0.44–1.00)
GFR, Estimated: 39 mL/min — ABNORMAL LOW (ref >60)
Glucose, Bld: 136 mg/dL — ABNORMAL HIGH (ref 70–99)
Potassium: 3.8 mmol/L (ref 3.5–5.1)
Sodium: 139 mmol/L (ref 135–145)
Total Bilirubin: 0.7 mg/dL (ref 0.0–1.2)
Total Protein: 6.8 g/dL (ref 6.5–8.1)

## 2024-08-17 LAB — CBC WITH DIFFERENTIAL (CANCER CENTER ONLY)
Abs Immature Granulocytes: 0.18 K/uL — ABNORMAL HIGH (ref 0.00–0.07)
Basophils Absolute: 0 K/uL (ref 0.0–0.1)
Basophils Relative: 1 %
Eosinophils Absolute: 0.3 K/uL (ref 0.0–0.5)
Eosinophils Relative: 4 %
HCT: 32.5 % — ABNORMAL LOW (ref 36.0–46.0)
Hemoglobin: 10.3 g/dL — ABNORMAL LOW (ref 12.0–15.0)
Immature Granulocytes: 2 %
Lymphocytes Relative: 15 %
Lymphs Abs: 1.3 K/uL (ref 0.7–4.0)
MCH: 27.8 pg (ref 26.0–34.0)
MCHC: 31.7 g/dL (ref 30.0–36.0)
MCV: 87.8 fL (ref 80.0–100.0)
Monocytes Absolute: 0.7 K/uL (ref 0.1–1.0)
Monocytes Relative: 8 %
Neutro Abs: 6.2 K/uL (ref 1.7–7.7)
Neutrophils Relative %: 70 %
Platelet Count: 243 K/uL (ref 150–400)
RBC: 3.7 MIL/uL — ABNORMAL LOW (ref 3.87–5.11)
RDW: 15.9 % — ABNORMAL HIGH (ref 11.5–15.5)
WBC Count: 8.7 K/uL (ref 4.0–10.5)
nRBC: 0 % (ref 0.0–0.2)

## 2024-08-17 MED ORDER — FULVESTRANT 250 MG/5ML IM SOSY
500.0000 mg | PREFILLED_SYRINGE | Freq: Once | INTRAMUSCULAR | Status: AC
  Administered 2024-08-17: 500 mg via INTRAMUSCULAR
  Filled 2024-08-17: qty 10

## 2024-08-17 NOTE — Assessment & Plan Note (Addendum)
 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 Continue calcium  and vitamin D supplementation. 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    Zometa   3mg  every 3 months. next due in Jan 2026

## 2024-08-17 NOTE — Assessment & Plan Note (Signed)
#  Grade 2 neuropathy, continue gabapentin ,  Continue gabapentin  600 mg in a.m.,night time  900 mg  Previously declined acupuncture

## 2024-08-17 NOTE — Assessment & Plan Note (Signed)
 Encourage oral hydration and avoid nephrotoxins.  Could be side effects from Capivasertib  Discuss with nephrology. Weight benefits and risks, will continue Capivasertib  with close monitoring of kidney function.  Check myeloma panel, light chain, UPEP, ANA, PTH.

## 2024-08-17 NOTE — Progress Notes (Signed)
 Hematology/Oncology Progress note [phone] [phone]     CHIEF COMPLAINTS/REASON FOR VISIT:  metastatic breast caner ASSESSMENT & PLAN:   Cancer Staging  Primary malignant neoplasm of breast with metastasis (HCC) Staging form: Breast, AJCC 8th Edition - Clinical stage from 07/12/2021: Stage IV (rcTX, rcNX, rpM1, ER+, PR+, HER2-) - Signed by Babara Call, MD on 03/08/2022   Primary malignant neoplasm of breast with metastasis (HCC) Metastatic Breast cancer with extensive thoracic and bone involvement, in visceral crisis, ER+, 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  Labs are reviewed and discussed with patient. Continue fulvestrant  and Capivasertib  [400mg  Q12h on D1-4 of each week].  fulvestrant  monthly.  She tolerates well except  Facial rash. Glucose is stable.  CT and MRI brain in Nov 2025 results are reviewed. Stable disease.   Chemotherapy induced diarrhea Stable symptoms Imodium PRN  Metastasis to bone [facility]) 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 Continue calcium  and vitamin D supplementation. 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    Zometa   3mg  every 3 months. next due in Jan 2026   Chemotherapy-induced neuropathy #Grade 2 neuropathy, continue gabapentin ,  Continue gabapentin  600 mg in a.m.,night time  900 mg  Previously declined acupuncture    Elevated serum creatinine Encourage oral hydration and avoid nephrotoxins.  Could be side effects from Capivasertib  Discuss with nephrology. Weight  benefits and risks, will continue Capivasertib  with close monitoring of kidney function.  Check myeloma panel, light chain, UPEP, ANA, PTH.   Encounter for antineoplastic chemotherapy Chemotherapy plan as listed above   Facial rash Possible cutaneous toxicity from Capivasertib , although skin rash occurrence is sporadic.  Recommend continue to monitor symptoms. She may use topical steroid PRN.          Orders Placed This Encounter  Procedures   Cancer antigen 15-3    Standing Status:   Future    Expected Date:   09/14/2024    Expiration Date:   12/13/2024   Cancer antigen 27.29    Standing Status:   Future    Expected Date:   09/14/2024    Expiration Date:   12/13/2024   CBC with Differential (Cancer Center Only)    Standing Status:   Future    Expected Date:   09/14/2024    Expiration Date:   12/13/2024   CMP (Cancer Center only)    Standing Status:   Future    Expected Date:   09/14/2024    Expiration Date:   12/13/2024   IFE+PROTEIN ELECTRO, 24-HR UR    Standing Status:   Future    Expected Date:   08/20/2024    Expiration Date:   11/18/2024    Follow up 4 weeks lab MD  All questions were answered. The patient knows to call the clinic with any problems, questions or concerns.  Call Babara, MD, PhD [facility] Hematology Oncology 08/17/2024      HISTORY OF PRESENTING ILLNESS:   Amber Blevins is a  73 y.o.  female with PMH listed below presents for follow up for treatment of metastatic breast cancer.   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 Extensive malignant tumor replacing the bones of the lower lumbar vertebrae (L4 and L5), visible  sacrum, and pelvis. 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 Additional metastatic involvement T12, L1 through L3.  No primary tumor site identified. Top differential considerations are Metastatic Disease Unknown primary, less likely Lymphoma or Multiple Myeloma.   2. Superimposed lumbar spine degeneration, including degenerative moderate to severe left L3 and L4 nerve level impingement from disc herniation.      07/02/2021 Imaging   CT chest abdomen pelvis showed innumerable small pulmonary and pleural nodules consistent with diffuse metastasis.  Associated with probable malignant pleural effusion.  Mediastinal and hilar lymphadenopathy consistent with metastatic disease.  Hepatic and bilateral adrenal gland metastasis.  Diffuse extensive destructive metastatic bone disease involving pelvis.   07/12/2021 Cancer Staging   Staging form: Breast, AJCC 8th Edition - Clinical stage from 07/12/2021: Stage IV (rcTX, cNX, pM1, ER+, PR+, HER2-) - Signed by Babara Call, MD on 03/08/2022 Stage prefix: Recurrence   07/17/2021 Initial Diagnosis   Metastatic breast cancer -history of breast cancer, diagnosed in 2010 Left-sided T2 (2.4cm) N0, ER/PR positive, HER2 negative IDC of the breast, s/p lumpectomy by Dr.Meyer at [facility]. Oncotype score of 11 who completed radiation and she finished 10 years of Femara  from 05/2009 and stopped in 2020.  -07/12/2021 patient underwent right thoracentesis.  Cytology was positive for metastatic carcinoma, compatible with breast origin.  ER/PR +, HER2 negative -07/16/2021, patient underwent iliac bone biopsy.  Positive for metastatic carcinoma.   07/21/2021 - 09/16/2021 Chemotherapy   Patient is on Treatment Plan : BREAST Paclitaxel  D1,8,15 q28d     09/04/2021 Imaging   CT chest abdomen pelvis showed improvement of the pulmonary nodularity and  thickening in the left and the right lung.  Persistent nodular interstitial and pleural thickening remains.  Improvement of mediastinal and hilar adenopathy, hepatic metastatic lesions are similar.  Adrenal gland metastasis is similar.  Interval increase in the sclerotic bone metastasis.    09/30/2021 -  Chemotherapy   started on abemaciclib  100 mg twice daily and letrozole .   02/24/2022 Imaging    CT chest abdomen pelvis with contrast showed unchanged bilateral pleural effusion with associated intralobular septal thickening of the lung bases.  Without discrete nodularity consistent with stable appearance of treated pleural and lymphangitic metastatic disease.  Stable or minimally diminished subcentimeter liver lesions.  Unchanged left adrenal metastasis.  Unchanged widespread sclerotic osseous metastatic disease involving the included axial and appendicular skeleton.  No evidence of new metastatic disease in the chest abdomen or pelvis.  Coronary artery disease.   03/06/2022 Procedure   Ultrasound guided thoracentesis, removal of pleural fluid.    05/29/2022 Imaging   CT chest abdomen pelvis 1. Stable CT of the chest, abdomen and pelvis. 2. Unchanged appearance of bilateral pleural effusions, pleural nodularity, and interlobular septal thickening compatible with pleural and lymphangitic metastatic disease. 3. Stable appearance of multifocal sclerotic bone metastases. 4. Multifocal low-attenuation liver lesions are unchanged in the interval. 5. Stable appearance of left adrenal gland metastases.  6. Stable appearance of small pericardial effusion.7. No new or progressive disease identified.8. Aortic Atherosclerosis (ICD10-I70.0).   06/06/2022 Imaging   Bone scan Constellation of findings are consistent with multifocal osseous metastatic disease throughout the axial and appendicular skeleton.Of note, osseous metastatic disease involves the RIGHT greater than LEFT femur. Consider evaluation with  dedicated hip and femur radiographs to evaluate for overall disease burden and potential risk for pathologic fracture.   08/31/2022 Imaging   CT chest abdomen pelvis w contrast 1. Unchanged small right, trace left pleural effusions with associated diffuse interlobular septal thickening and fine fissural and perilymphatic nodularity throughout the lungs, findings consistent with lymphangitic metastatic disease. 2. Stable or slightly diminished very subtle hypodense lesion of the central right lobe of the liver, hepatic segment VIII measuring 0.6 cm, previously 0.8 cm. Additional unchanged, very subtle hypodense lesion of hepatic segment IV B measuring 1.1 cm. 3. Unchanged widespread sclerotic osseous metastatic disease throughout the included axial and proximal appendicular skeleton, particularly dense in the lower lumbar spine and sacrum. 4. Unchanged left adrenal nodule measuring 1.7 x 1.1 cm. 5. Overall constellation of findings is consistent with stable metastatic disease. 6. Coronary artery disease.    12/01/2022 Miscellaneous   [facility] liquid biopsy - PIK3CA mutation. 0.1% Not enough tissue for tissue NGS   03/08/2023 Imaging   Bone scan showed Multifocal osseous metastatic disease. One new focus along the right side of the skull   08/23/2023 - 09/06/2023 Radiation Therapy   Skull radiation.    12/24/2023 Imaging   CT chest abdomen pelvis wo contrast showed 1. 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 2. New indistinct 0.6 cm medial right lower lobe pulmonary nodule, indeterminate for inflammatory nodule versus pulmonary metastasis. Suggest attention on follow-up chest CT in 3 months. 3. Widespread patchy sclerotic osseous metastatic disease, not appreciably changed. 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 4. Stable small layering right pleural effusion. Stable small pericardial effusion. 5. Three-vessel coronary atherosclerosis. 6.  Long-term stability of left adrenal nodule favoring an adenoma. 7. Moderate left colonic diverticulosis. 8.  Aortic Atherosclerosis (ICD10-I70.0).     Procedure   Liver biopsy Pathology showed metasatic adenocarcinoma Immunohistochemical stains show that the tumor cells are positive for CK7 and GATA3 while they are negative for CK20 and CDX2, consistent with patient's clinical history of primary breast carcinoma.     ER 95%, PR 0%, HER2 low (1+), Ki 67 5% NGS showed PIK3CA mutation, CHEK mutation.    02/07/2024 -  Chemotherapy   Started on Capivasertib  400mg  Q12h on D1-4 of each week   05/18/2024 Imaging   CT chest abdomen pelvis w contrast   1. Interval decrease in size of multiple hypodense liver metastases consistent with treatment response. 2. Unchanged widespread sclerotic osseous metastatic disease, notable for chronic wedge deformities of T7 and T10 as well as very extensive involvement of the sacrum. 3. Trace right pleural effusion, slightly diminished in volume. 4. Emphysema. 5. Coronary artery disease.   Aortic Atherosclerosis (ICD10-I70.0) and Emphysema (ICD10-J43.9).   Metastasis to bone (HCC)  02/04/2022 Initial Diagnosis   Metastasis to bone [facility])    Patient follows up with cardiology for evaluation of bradycardia.  Cardiology notes reviewed.  72-hour Holter monitor revealed revealed predominant sinus rhythm with mean heart rate of 96 bpm, heart rate range 53 to 129 bpm, frequent premature ventricular contractions (27% burden), and intermittent atrial fibrillation (5% burden) with longest episode 3 hours. CHA2DS2-VASc score of 3, per cardiology recommendation patient was started  on Eliquis 5 mg twice daily.     INTERVAL HISTORY Amber Blevins is a 73 y.o. female who has above history reviewed by me today presents for follow up visit for metastatic breast cancer  Discussed the use of AI scribe software for clinical note transcription with the patient, who  gave verbal consent to proceed.   She has been experiencing facial flushing and rash, episode usually lasts for a few days.  The episodes are characterized by facial flushing, warmth, and redness, primarily on the right side of her face, starting behind her ears, moving across her forehead, and then down the side of her face. The area is tender, hot, and slightly itchy. The episodes occurred from September 10th to 17th and November 12th to the present, with no occurrences in October.  Recent she established care with nephrology Denies abdominal pain,nausea or vomiting. Occasional diarrhea.  Since May 2025, she has been on Capivasertib  400mg  Q12h on D1-4 of each week.    .  Review of Systems  Constitutional:  Negative for appetite change, chills, fatigue and fever.  HENT:   Negative for hearing loss and voice change.   Eyes:  Negative for eye problems.  Respiratory:  Negative for chest tightness, cough and shortness of breath.   Cardiovascular:  Negative for chest pain.  Gastrointestinal:  Negative for abdominal distention, abdominal pain and blood in stool.  Endocrine: Negative for hot flashes.  Genitourinary:  Negative for difficulty urinating and frequency.   Musculoskeletal:  Negative for arthralgias.  Skin:  Negative for itching and rash.  Neurological:  Positive for numbness. Negative for extremity weakness.  Hematological:  Negative for adenopathy.  Psychiatric/Behavioral:  Negative for confusion.      MEDICAL HISTORY:  Past Medical History:  Diagnosis Date   A-fib (HCC)    Breast cancer (HCC)    COPD (chronic obstructive pulmonary disease) (HCC)    Family history of adverse reaction to anesthesia    brother has problem with coming out of anesthesia   High cholesterol    Hypertension    Personal history of radiation therapy    Pre-diabetes     SURGICAL HISTORY: Past Surgical History:  Procedure Laterality Date   BREAST LUMPECTOMY Left    2010   COLONOSCOPY     EYE  SURGERY Left    Cataract surgery   PORTACATH PLACEMENT N/A 08/01/2021   Procedure: INSERTION PORT-A-CATH;  Surgeon: Rodolph Romano, MD;  Location: [facility] ORS;  Service: General;  Laterality: N/A;   thorocentesis Right 07/10/2021   and another on 07/25/21    SOCIAL HISTORY: Social History   Socioeconomic History   Marital status: Married    Spouse name: Not on file   Number of children: Not on file   Years of education: Not on file   Highest education level: Not on file  Occupational History   Not on file  Tobacco Use   Smoking status: Former    Current packs/day: 0.00    Average packs/day: 1 pack/day for 25.0 years (25.0 ttl pk-yrs)    Types: Cigarettes    Start date: 29    Quit date: 16    Years since quitting: 35.9   Smokeless tobacco: Never  Vaping Use   Vaping status: Never Used  Substance and Sexual Activity   Alcohol use: Not Currently   Drug use: Never   Sexual activity: Not on file  Other Topics Concern   Not on file  Social History Narrative   Not  on file   Social Drivers of Health   Financial Resource Strain: Low Risk  (07/26/2024)   Received from [facility]   Overall Financial Resource Strain (CARDIA)    Difficulty of Paying Living Expenses: Not hard at all  Food Insecurity: No Food Insecurity (07/26/2024)   Received from [facility]   Hunger Vital Sign    Within the past 12 months, you worried that your food would run out before you got the money to buy more.: Never true    Within the past 12 months, the food you bought just didn't last and you didn't have money to get more.: Never true  Transportation Needs: No Transportation Needs (07/26/2024)   Received from [facility] - Transportation    In the past 12 months, has lack of transportation kept you from medical appointments or from getting medications?: No    Lack of Transportation (Non-Medical): No  Physical Activity: Not on  file  Stress: Not on file  Social Connections: Not on file  Intimate Partner Violence: Not on file    FAMILY HISTORY: Family History  Problem Relation Age of Onset   Ovarian cancer Mother 76   Lung cancer Mother 28   Diabetes Father    Heart disease Father    Parkinson's disease Father    Bladder Cancer Brother 72   Pulmonary disease Brother    Rheumatic fever Brother    Prostate cancer Cousin 55   Prostate cancer Cousin 22   Breast cancer Neg Hx     ALLERGIES:  is allergic to green tea (camellia sinensis), melaleuca viridiflora, tea tree oil, and lisinopril.  MEDICATIONS:  Current Outpatient Medications  Medication Sig Dispense Refill   acetaminophen  (TYLENOL ) 650 MG CR tablet Take 650 mg by mouth every 8 (eight) hours as needed for pain.     atorvastatin  (LIPITOR) 40 MG tablet Take 40 mg by mouth at bedtime.     Boswellia-Glucosamine-Vit D (OSTEO BI-FLEX-GLUCOS/5-LOXIN) TABS Take 1 capsule by mouth in the morning and at bedtime.     Calcium  Carb-Cholecalciferol 600-400 MG-UNIT TABS Take 1 tablet by mouth 2 (two) times daily.     capivasertib  (TRUQAP ) 200 MG tablet Take 2 tablets (400 mg total) by mouth 2 (two) times daily. Take for 4 days, then hold for 3 days. Repeat every 7 days. 64 tablet 0   cetirizine (ZYRTEC) 10 MG chewable tablet Chew 10 mg by mouth daily as needed for allergies.     cyanocobalamin 1000 MCG tablet Take 1,000 mcg by mouth daily.     diphenoxylate -atropine  (LOMOTIL ) 2.5-0.025 MG tablet Take 1 tablet by mouth 4 (four) times daily as needed for diarrhea or loose stools. 30 tablet 0   Docusate Sodium (DSS) 100 MG CAPS Take 1 capsule by mouth daily.     ELIQUIS 5 MG TABS tablet Take 5 mg by mouth 2 (two) times daily.     gabapentin  (NEURONTIN ) 300 MG capsule TAKE 2 CAPSULES BY MOUTH IN THE MORNING AND 3 CAPSULES IN THE EVENING 450 capsule 0   Iron-Vitamin C (VITRON-C) 65-125 MG TABS Take 1 tablet by mouth daily.     lidocaine -prilocaine  (EMLA ) cream Apply  small amount to port and cover with saran wrap 1-2 hours prior to port access 30 g 5   Loperamide HCl (IMODIUM PO) Take by mouth.     losartan (COZAAR) 50 MG tablet Take 50 mg by mouth daily.     magnesium chloride (  SLOW-MAG) 64 MG TBEC SR tablet Take 1 tablet by mouth at bedtime.     melatonin (MELATONIN MAXIMUM STRENGTH) 5 MG TABS Take 1 tablet by mouth at bedtime as needed.     ondansetron  (ZOFRAN ) 8 MG tablet Take 1 tablet (8 mg total) by mouth every 8 (eight) hours as needed for nausea or vomiting. 20 tablet 1   Turmeric (QC TUMERIC COMPLEX PO) Take 1 capsule by mouth at bedtime.     No current facility-administered medications for this visit.   Facility-Administered Medications Ordered in Other Visits  Medication Dose Route Frequency Provider Last Rate Last Admin   sodium chloride  flush (NS) 0.9 % injection 10 mL  10 mL Intravenous Once Babara Call, MD         PHYSICAL EXAMINATION: ECOG PERFORMANCE STATUS: 1 - Symptomatic but completely ambulatory Vitals:   08/17/24 0838  BP: 137/75  Pulse: 87  Resp: 18  Temp: 97.7 F (36.5 C)  SpO2: 98%    Filed Weights   08/17/24 0838  Weight: 198 lb 14.4 oz (90.2 kg)     Physical Exam Constitutional:      General: She is not in acute distress.    Appearance: She is not diaphoretic.  HENT:     Head: Normocephalic and atraumatic.     Nose: Nose normal.     Mouth/Throat:     Pharynx: No oropharyngeal exudate.  Eyes:     General: No scleral icterus.    Pupils: Pupils are equal, round, and reactive to light.  Cardiovascular:     Rate and Rhythm: Normal rate and regular rhythm.     Heart sounds: No murmur heard. Pulmonary:     Effort: Pulmonary effort is normal. No respiratory distress.     Comments: Mild decreased breath sound bilaterally.. L>R Abdominal:     General: There is no distension.     Palpations: Abdomen is soft.     Tenderness: There is no abdominal tenderness.  Musculoskeletal:        General: Normal range of  motion.     Cervical back: Normal range of motion and neck supple.  Skin:    General: Skin is warm and dry.     Findings: No erythema.  Neurological:     Mental Status: She is alert and oriented to person, place, and time.     Cranial Nerves: No cranial nerve deficit.     Motor: No abnormal muscle tone.     Coordination: Coordination normal.  Psychiatric:        Mood and Affect: Affect normal.       LABORATORY DATA:  I have reviewed the data as listed     Latest Ref Rng & Units 08/17/2024    8:10 AM 07/20/2024    8:19 AM 06/22/2024    8:05 AM  CBC  WBC 4.0 - 10.5 K/uL 8.7  7.1  9.2   Hemoglobin 12.0 - 15.0 g/dL 89.6  89.0  88.7   Hematocrit 36.0 - 46.0 % 32.5  34.1  34.7   Platelets 150 - 400 K/uL 243  238  237       Latest Ref Rng & Units 08/17/2024    8:10 AM 07/20/2024    8:19 AM 06/22/2024    8:05 AM  CMP  Glucose 70 - 99 mg/dL 863  820  834   BUN 8 - 23 mg/dL 17  21  26    Creatinine 0.44 - 1.00 mg/dL 8.59  8.47  8.54  Sodium 135 - 145 mmol/L 139  137  137   Potassium 3.5 - 5.1 mmol/L 3.8  3.7  3.9   Chloride 98 - 111 mmol/L 105  104  106   CO2 22 - 32 mmol/L 25  25  24    Calcium  8.9 - 10.3 mg/dL 9.1  9.0  8.9   Total Protein 6.5 - 8.1 g/dL 6.8  6.9  6.9   Total Bilirubin 0.0 - 1.2 mg/dL 0.7  0.8  0.9   Alkaline Phos 38 - 126 U/L 67  68  72   AST 15 - 41 U/L 22  21  24    ALT 0 - 44 U/L 18  16  20       RADIOGRAPHIC STUDIES: I have personally reviewed the radiological images as listed and agreed with the findings in the report. MR Brain W Wo Contrast Result Date: 08/13/2024 EXAM: MRI BRAIN WITH AND WITHOUT CONTRAST 08/10/2024 10:16:00 AM TECHNIQUE: Multiplanar multisequence MRI of the head/brain was performed with and without the administration of intravenous contrast. CONTRAST: 8 mL of Gadavist . COMPARISON: MR Head 01/21/2024. CLINICAL HISTORY: Metastatic breast cancer, follow up evaluation. FINDINGS: BRAIN AND VENTRICLES: No acute infarct. No acute  intracranial hemorrhage. No mass effect or midline shift. No hydrocephalus. The sella is unremarkable. Normal flow voids. No contrast enhancing lesions within the brain parenchyma. Left ocular lens replacement. ORBITS: Left ocular lens replacement. No acute abnormality. SINUSES: No acute abnormality. BONES AND SOFT TISSUES: Unchanged appearance of the calvarial lesion along the right lateral aspect of the coronal suture measuring 21 mm in AP length. No new enhancing calvarial lesions. Normal bone marrow signal and enhancement. No acute soft tissue abnormality. IMPRESSION: 1. No intracranial metastatic disease identified. 2. 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 No new enhancing calvarial lesions. 3. No acute intracranial abnormality. Electronically signed by: Franky Stanford MD 08/13/2024 01:06 PM EST RP Workstation: [id number]   CT CHEST ABDOMEN PELVIS WO CONTRAST Result Date: 08/12/2024 CLINICAL DATA:  Metastatic breast cancer. Restaging. * Tracking Code: BO * EXAM: CT CHEST, ABDOMEN AND PELVIS WITHOUT CONTRAST TECHNIQUE: Multidetector CT imaging of the chest, abdomen and pelvis was performed following the standard protocol without IV contrast. RADIATION DOSE REDUCTION: This exam was performed according to the departmental dose-optimization program which includes automated exposure control, adjustment of the mA and/or kV according to patient size and/or use of iterative reconstruction technique. COMPARISON:  05/18/2024 FINDINGS: CT CHEST FINDINGS Cardiovascular: The heart size is normal. No substantial pericardial effusion. Coronary artery calcification is evident. Mild atherosclerotic calcification is noted in the wall of the thoracic aorta. Right Port-A-Cath tip is at the SVC/RA junction. Mediastinum/Nodes: No mediastinal lymphadenopathy. No evidence for gross hilar lymphadenopathy although assessment is limited by the lack of intravenous contrast on the current study. The esophagus has normal  imaging features. There is no axillary lymphadenopathy. Lungs/Pleura: Subtle changes of centrilobular emphysema. Subtle irregularity of the major fissures is unchanged. No new suspicious pulmonary nodule or mass. Trace right pleural effusion evident but decreased since prior. Musculoskeletal: Similar appearance of sclerotic bone lesions. CT ABDOMEN PELVIS FINDINGS Hepatobiliary: Subtle mineralized liver lesions are stable (see anterior liver on 56/2), compatible with treated metastases. The 10 mm low-density lesion seen anterior right liver previously is not evident on the current study. There is no evidence for gallstones, gallbladder wall thickening, or pericholecystic fluid. No intrahepatic or extrahepatic biliary dilation. Pancreas: No focal mass lesion. No dilatation of the main duct. No intraparenchymal cyst. No peripancreatic edema. Spleen:  No splenomegaly. No suspicious focal mass lesion. Adrenals/Urinary Tract: Stable 17 mm left adrenal nodule. Right adrenal gland unremarkable. Right kidney unremarkable. Central sinus cysts again noted left kidney. No hydroureter. The urinary bladder appears normal for the degree of distention. Stomach/Bowel: Stomach is unremarkable. No gastric wall thickening. No evidence of outlet obstruction. Duodenum is normally positioned as is the ligament of Treitz. No small bowel wall thickening. No small bowel dilatation. The terminal ileum is normal. The appendix is not well visualized, but there is no edema or inflammation in the region of the cecal tip to suggest appendicitis. No gross colonic mass. No colonic wall thickening. Diverticular changes are noted in the left colon without evidence of diverticulitis. Vascular/Lymphatic: There is moderate atherosclerotic calcification of the abdominal aorta without aneurysm. There is no gastrohepatic or hepatoduodenal ligament lymphadenopathy. No retroperitoneal or mesenteric lymphadenopathy. No pelvic sidewall lymphadenopathy.  Reproductive: Unremarkable. Other: No intraperitoneal free fluid. Musculoskeletal: Pelvic floor laxity evident. Multiple sclerotic bone lesions again identified with diffuse involvement of the sacrum, similar to prior. IMPRESSION: 1. Stable exam. No new or progressive findings to suggest new or progressive metastatic disease in the chest, abdomen, or pelvis. 2. Stable appearance of sclerotic bone lesions. 3. Stable 17 mm left adrenal nodule compatible with benign adrenal adenoma. No followup imaging is recommended. 4. Trace right pleural effusion, decreased since prior. 5. Aortic Atherosclerosis (ICD10-I70.0) and Emphysema (ICD10-J43.9). Electronically Signed   By: Camellia Candle M.D.   On: 08/12/2024 08:03

## 2024-08-17 NOTE — Assessment & Plan Note (Signed)
 Possible cutaneous toxicity from Capivasertib , although skin rash occurrence is sporadic.  Recommend continue to monitor symptoms. She may use topical steroid PRN.

## 2024-08-17 NOTE — Assessment & Plan Note (Signed)
 Chemotherapy plan as listed above

## 2024-08-17 NOTE — Assessment & Plan Note (Signed)
 Stable symptoms Imodium PRN

## 2024-08-17 NOTE — Progress Notes (Signed)
 [facility] Advance Directives Clinical Social Work  Patient presented to Advance Directives Clinic  to review and complete healthcare advance directives.  Clinical Social Worker met with patient and her husband, Amber Blevins.  The patient designated Amber Blevins as their primary healthcare agent and Amber Blevins as their secondary agent.  Patient also completed healthcare living will.    Documents were notarized and copies made for patient/family. Clinical Social Worker will send documents to medical records to be scanned into patient's chart. Clinical Social Worker encouraged patient/family to contact with any additional questions or concerns.   Amber CHRISTELLA Au, LCSW Clinical Social Worker [facility]

## 2024-08-17 NOTE — Assessment & Plan Note (Addendum)
 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  Labs are reviewed and discussed with patient. Continue fulvestrant  and Capivasertib  [400mg  Q12h on D1-4 of each week].  fulvestrant  monthly.  She tolerates well except  Facial rash. Glucose is stable.  CT and MRI brain in Nov 2025 results are reviewed. Stable disease.

## 2024-08-17 NOTE — Progress Notes (Signed)
 Pt here for follow up. Pt reports that she may be having a reaction to Truqap . She gets tenderness, heat, redness in the face.It has only happened twice since she has been on the medication. Pt denies any shortness of breath or mouth swelling.

## 2024-08-18 LAB — CANCER ANTIGEN 15-3: CA 15-3: 13.7 U/mL (ref 0.0–25.0)

## 2024-08-18 LAB — CANCER ANTIGEN 27.29: CA 27.29: <9 U/mL (ref 0.0–38.6)

## 2024-08-18 LAB — PARATHYROID HORMONE, INTACT (NO CA): PTH: 17 pg/mL (ref 15–65)

## 2024-08-18 LAB — ANA W/REFLEX: Anti Nuclear Antibody (ANA): NEGATIVE

## 2024-08-22 ENCOUNTER — Other Ambulatory Visit: Payer: Self-pay

## 2024-08-22 DIAGNOSIS — C50919 Malignant neoplasm of unspecified site of unspecified female breast: Secondary | ICD-10-CM

## 2024-08-22 DIAGNOSIS — Z5111 Encounter for antineoplastic chemotherapy: Secondary | ICD-10-CM | POA: Diagnosis not present

## 2024-08-22 LAB — MULTIPLE MYELOMA PANEL, SERUM
Albumin SerPl Elph-Mcnc: 3.9 g/dL (ref 2.9–4.4)
Albumin/Glob SerPl: 1.7 (ref 0.7–1.7)
Alpha 1: 0.3 g/dL (ref 0.0–0.4)
Alpha2 Glob SerPl Elph-Mcnc: 0.8 g/dL (ref 0.4–1.0)
B-Globulin SerPl Elph-Mcnc: 0.8 g/dL (ref 0.7–1.3)
Gamma Glob SerPl Elph-Mcnc: 0.5 g/dL (ref 0.4–1.8)
Globulin, Total: 2.4 g/dL (ref 2.2–3.9)
IgA: 87 mg/dL (ref 64–422)
IgG (Immunoglobin G), Serum: 540 mg/dL — ABNORMAL LOW (ref 586–1602)
IgM (Immunoglobulin M), Srm: 34 mg/dL (ref 26–217)
Total Protein ELP: 6.3 g/dL (ref 6.0–8.5)

## 2024-08-30 LAB — IFE+PROTEIN ELECTRO, 24-HR UR
% BETA, Urine: 28.1 %
ALPHA 1 URINE: 8.4 %
Albumin, U: 23.5 %
Alpha 2, Urine: 23.7 %
GAMMA GLOBULIN URINE: 16.3 %
Total Protein, Urine-Ur/day: 257 mg/(24.h) — ABNORMAL HIGH (ref 30–150)
Total Protein, Urine: 13.9 mg/dL
Total Volume: 1850

## 2024-09-01 ENCOUNTER — Other Ambulatory Visit: Payer: Self-pay | Admitting: Oncology

## 2024-09-04 ENCOUNTER — Encounter: Payer: Self-pay | Admitting: Oncology

## 2024-09-05 ENCOUNTER — Other Ambulatory Visit: Payer: Self-pay | Admitting: Physician Assistant

## 2024-09-05 DIAGNOSIS — Z780 Asymptomatic menopausal state: Secondary | ICD-10-CM

## 2024-09-06 ENCOUNTER — Other Ambulatory Visit (HOSPITAL_COMMUNITY): Payer: Self-pay

## 2024-09-06 ENCOUNTER — Other Ambulatory Visit: Payer: Self-pay

## 2024-09-06 ENCOUNTER — Other Ambulatory Visit: Payer: Self-pay | Admitting: Oncology

## 2024-09-06 DIAGNOSIS — C50919 Malignant neoplasm of unspecified site of unspecified female breast: Secondary | ICD-10-CM

## 2024-09-06 MED ORDER — CAPIVASERTIB 200 MG PO TABS
400.0000 mg | ORAL_TABLET | Freq: Two times a day (BID) | ORAL | 0 refills | Status: DC
  Filled 2024-09-06: qty 64, 16d supply, fill #0
  Filled 2024-09-07: qty 64, 28d supply, fill #0

## 2024-09-07 ENCOUNTER — Other Ambulatory Visit: Payer: Self-pay | Admitting: Pharmacy Technician

## 2024-09-07 ENCOUNTER — Other Ambulatory Visit: Payer: Self-pay

## 2024-09-07 NOTE — Progress Notes (Signed)
 Specialty Pharmacy Refill Coordination Note  Amber Blevins is a 73 y.o. female contacted today regarding refills of specialty medication(s) Capivasertib  (TRUQAP )   Patient requested Delivery   Delivery date: 09/13/24   Verified address: [address]  [place]   Medication will be filled on: 09/12/24

## 2024-09-12 ENCOUNTER — Other Ambulatory Visit: Payer: Self-pay

## 2024-09-12 ENCOUNTER — Encounter: Payer: Self-pay | Admitting: Ophthalmology

## 2024-09-13 ENCOUNTER — Other Ambulatory Visit: Payer: Self-pay | Admitting: Oncology

## 2024-09-13 ENCOUNTER — Encounter: Payer: Self-pay | Admitting: Ophthalmology

## 2024-09-13 NOTE — Anesthesia Preprocedure Evaluation (Addendum)
 Anesthesia Evaluation    Airway        Dental   Pulmonary former smoker          Cardiovascular hypertension,   Hx A fib  07-13-21 echo 1. Left ventricular ejection fraction, by estimation, is 60 to 65%. The  left ventricle has normal function. The left ventricle has no regional  wall motion abnormalities. There is mild left ventricular hypertrophy.  Left ventricular diastolic parameters  are consistent with Grade I diastolic dysfunction (impaired relaxation).   2. Right ventricular systolic function is normal. The right ventricular  size is normal. Tricuspid regurgitation signal is inadequate for assessing  PA pressure.   3. The mitral valve is normal in structure. No evidence of mitral valve  regurgitation. No evidence of mitral stenosis.      Neuro/Psych    GI/Hepatic   Endo/Other    Renal/GU      Musculoskeletal   Abdominal   Peds  Hematology   Anesthesia Other Findings Hx port-a-cath 08-01-21 Dr. Dario  08-17-24 office note: 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 Continue calcium  and vitamin D supplementation. 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    Personal history of radiation therapy  Breast cancer w/extensive mets (HCC) Hypertension  High cholesterol Family history of adverse reaction to anesthesia  COPD (chronic obstructive pulmonary disease) (HCC) Pre-diabetes  A-fib (HCC) Asthma  Dyspnea Stage 3a chronic kidney disease (CKD) Chemotherapy-induced neuropathy Grade I diastolic dysfunction    Reproductive/Obstetrics                              Anesthesia Physical Anesthesia Plan  ASA: 3  Anesthesia Plan: MAC   Post-op Pain Management:    Induction: Intravenous  PONV Risk Score and Plan:   Airway Management Planned: Natural Airway and  Nasal Cannula  Additional Equipment:   Intra-op Plan:   Post-operative Plan:   Informed Consent: I have reviewed the patients History and Physical, chart, labs and discussed the procedure including the risks, benefits and alternatives for the proposed anesthesia with the patient or authorized representative who has indicated his/her understanding and acceptance.     Dental Advisory Given  Plan Discussed with: Anesthesiologist, CRNA and Surgeon  Anesthesia Plan Comments: (Patient consented for risks of anesthesia including but not limited to:  - adverse reactions to medications - damage to eyes, teeth, lips or other oral mucosa - nerve damage due to positioning  - sore throat or hoarseness - Damage to heart, brain, nerves, lungs, other parts of body or loss of life  Patient voiced understanding and assent.)        Anesthesia Quick Evaluation

## 2024-09-14 ENCOUNTER — Inpatient Hospital Stay

## 2024-09-14 ENCOUNTER — Inpatient Hospital Stay: Admitting: Oncology

## 2024-09-14 ENCOUNTER — Inpatient Hospital Stay: Attending: Oncology

## 2024-09-14 ENCOUNTER — Inpatient Hospital Stay: Attending: Oncology | Admitting: Oncology

## 2024-09-14 ENCOUNTER — Encounter: Payer: Self-pay | Admitting: Oncology

## 2024-09-14 VITALS — BP 147/78 | HR 88 | Temp 97.3°F | Resp 18 | Wt 198.6 lb

## 2024-09-14 DIAGNOSIS — K521 Toxic gastroenteritis and colitis: Secondary | ICD-10-CM

## 2024-09-14 DIAGNOSIS — C787 Secondary malignant neoplasm of liver and intrahepatic bile duct: Secondary | ICD-10-CM | POA: Diagnosis not present

## 2024-09-14 DIAGNOSIS — C7971 Secondary malignant neoplasm of right adrenal gland: Secondary | ICD-10-CM | POA: Diagnosis not present

## 2024-09-14 DIAGNOSIS — C7951 Secondary malignant neoplasm of bone: Secondary | ICD-10-CM | POA: Diagnosis not present

## 2024-09-14 DIAGNOSIS — R21 Rash and other nonspecific skin eruption: Secondary | ICD-10-CM

## 2024-09-14 DIAGNOSIS — Z87891 Personal history of nicotine dependence: Secondary | ICD-10-CM | POA: Diagnosis not present

## 2024-09-14 DIAGNOSIS — C7972 Secondary malignant neoplasm of left adrenal gland: Secondary | ICD-10-CM | POA: Diagnosis not present

## 2024-09-14 DIAGNOSIS — C50412 Malignant neoplasm of upper-outer quadrant of left female breast: Secondary | ICD-10-CM | POA: Insufficient documentation

## 2024-09-14 DIAGNOSIS — Z79899 Other long term (current) drug therapy: Secondary | ICD-10-CM | POA: Diagnosis not present

## 2024-09-14 DIAGNOSIS — Z7983 Long term (current) use of bisphosphonates: Secondary | ICD-10-CM | POA: Diagnosis not present

## 2024-09-14 DIAGNOSIS — C50919 Malignant neoplasm of unspecified site of unspecified female breast: Secondary | ICD-10-CM

## 2024-09-14 DIAGNOSIS — N189 Chronic kidney disease, unspecified: Secondary | ICD-10-CM

## 2024-09-14 DIAGNOSIS — G620 Drug-induced polyneuropathy: Secondary | ICD-10-CM | POA: Diagnosis not present

## 2024-09-14 DIAGNOSIS — Z79811 Long term (current) use of aromatase inhibitors: Secondary | ICD-10-CM | POA: Diagnosis not present

## 2024-09-14 DIAGNOSIS — Z5111 Encounter for antineoplastic chemotherapy: Secondary | ICD-10-CM | POA: Insufficient documentation

## 2024-09-14 DIAGNOSIS — T451X5A Adverse effect of antineoplastic and immunosuppressive drugs, initial encounter: Secondary | ICD-10-CM

## 2024-09-14 DIAGNOSIS — Z923 Personal history of irradiation: Secondary | ICD-10-CM | POA: Insufficient documentation

## 2024-09-14 LAB — CBC WITH DIFFERENTIAL (CANCER CENTER ONLY)
Abs Immature Granulocytes: 0.16 K/uL — ABNORMAL HIGH (ref 0.00–0.07)
Basophils Absolute: 0 K/uL (ref 0.0–0.1)
Basophils Relative: 0 %
Eosinophils Absolute: 0.3 K/uL (ref 0.0–0.5)
Eosinophils Relative: 4 %
HCT: 32.4 % — ABNORMAL LOW (ref 36.0–46.0)
Hemoglobin: 10.4 g/dL — ABNORMAL LOW (ref 12.0–15.0)
Immature Granulocytes: 2 %
Lymphocytes Relative: 15 %
Lymphs Abs: 1.2 K/uL (ref 0.7–4.0)
MCH: 28.6 pg (ref 26.0–34.0)
MCHC: 32.1 g/dL (ref 30.0–36.0)
MCV: 89 fL (ref 80.0–100.0)
Monocytes Absolute: 0.5 K/uL (ref 0.1–1.0)
Monocytes Relative: 7 %
Neutro Abs: 5.7 K/uL (ref 1.7–7.7)
Neutrophils Relative %: 72 %
Platelet Count: 248 K/uL (ref 150–400)
RBC: 3.64 MIL/uL — ABNORMAL LOW (ref 3.87–5.11)
RDW: 16.1 % — ABNORMAL HIGH (ref 11.5–15.5)
WBC Count: 7.8 K/uL (ref 4.0–10.5)
nRBC: 0 % (ref 0.0–0.2)

## 2024-09-14 LAB — CMP (CANCER CENTER ONLY)
ALT: 16 U/L (ref 0–44)
AST: 20 U/L (ref 15–41)
Albumin: 4.1 g/dL (ref 3.5–5.0)
Alkaline Phosphatase: 83 U/L (ref 38–126)
Anion gap: 10 (ref 5–15)
BUN: 20 mg/dL (ref 8–23)
CO2: 26 mmol/L (ref 22–32)
Calcium: 9.4 mg/dL (ref 8.9–10.3)
Chloride: 102 mmol/L (ref 98–111)
Creatinine: 1.31 mg/dL — ABNORMAL HIGH (ref 0.44–1.00)
GFR, Estimated: 43 mL/min — ABNORMAL LOW (ref >60)
Glucose, Bld: 310 mg/dL — ABNORMAL HIGH (ref 70–99)
Potassium: 4.1 mmol/L (ref 3.5–5.1)
Sodium: 139 mmol/L (ref 135–145)
Total Bilirubin: 0.6 mg/dL (ref 0.0–1.2)
Total Protein: 6.6 g/dL (ref 6.5–8.1)

## 2024-09-14 MED ORDER — FULVESTRANT 250 MG/5ML IM SOSY
500.0000 mg | PREFILLED_SYRINGE | Freq: Once | INTRAMUSCULAR | Status: AC
  Administered 2024-09-14: 09:00:00 500 mg via INTRAMUSCULAR
  Filled 2024-09-14: qty 10

## 2024-09-14 NOTE — Discharge Instructions (Signed)

## 2024-09-14 NOTE — Assessment & Plan Note (Signed)
 Possible cutaneous toxicity from Capivasertib , although skin rash occurrence is sporadic. No rash today Recommend continue to monitor symptoms. She may use topical steroid PRN.

## 2024-09-14 NOTE — Assessment & Plan Note (Signed)
 Stable symptoms Imodium PRN

## 2024-09-14 NOTE — Assessment & Plan Note (Signed)
#  Grade 2 neuropathy, continue gabapentin ,  Continue gabapentin  600 mg in a.m.,night time  900 mg  Previously declined acupuncture

## 2024-09-14 NOTE — Assessment & Plan Note (Addendum)
 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  Labs are reviewed and discussed with patient. Continue fulvestrant  and Capivasertib  [400mg  Q12h on D1-4 of each week].  fulvestrant  monthly.  She tolerates well except facial rash.  Today's glucose level is high, continue monitor, she ate breakfast today.

## 2024-09-14 NOTE — Assessment & Plan Note (Signed)
 Chemotherapy plan as listed above

## 2024-09-14 NOTE — Progress Notes (Signed)
 Hematology/Oncology Progress note [phone] [phone]) [phone]     CHIEF COMPLAINTS/REASON FOR VISIT:  metastatic breast caner ASSESSMENT & PLAN:   Cancer Staging  Primary malignant neoplasm of breast with metastasis (HCC) Staging form: Breast, AJCC 8th Edition - Clinical stage from 07/12/2021: Stage IV (rcTX, rcNX, rpM1, ER+, PR+, HER2-) - Signed by Babara Call, MD on 03/08/2022   Primary malignant neoplasm of breast with metastasis (HCC) Metastatic Breast cancer with extensive thoracic and bone involvement, in visceral crisis, ER+, 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  Labs are reviewed and discussed with patient. Continue fulvestrant  and Capivasertib  [400mg  Q12h on D1-4 of each week].  fulvestrant  monthly.  She tolerates well except facial rash.  Today's glucose level is high, continue monitor, she ate breakfast today.     Encounter for antineoplastic chemotherapy Chemotherapy plan as listed above   Metastasis to bone [facility]) 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 Continue calcium  and vitamin D supplementation. 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    Zometa   3mg  every 3 months. next due in Jan 2026   Chemotherapy-induced neuropathy #Grade 2 neuropathy, continue gabapentin ,  Continue gabapentin  600 mg in a.m.,night time  900 mg  Previously declined acupuncture    Chemotherapy induced diarrhea Stable symptoms Imodium PRN  Facial rash Possible cutaneous toxicity from Capivasertib , although skin rash  occurrence is sporadic. No rash today Recommend continue to monitor symptoms. She may use topical steroid PRN.          Orders Placed This Encounter  Procedures   Parathyroid  hormone, intact (no Ca)    Standing Status:   Future    Number of Occurrences:   1    Expected Date:   09/14/2024    Expiration Date:   12/13/2024   Microalbumin / creatinine urine ratio    Standing Status:   Future    Number of Occurrences:   1    Expected Date:   09/14/2024    Expiration Date:   12/13/2024   Cancer antigen 15-3    Standing Status:   Future    Expected Date:   10/12/2024    Expiration Date:   01/10/2025   Cancer antigen 27.29    Standing Status:   Future    Expected Date:   10/12/2024    Expiration Date:   01/10/2025   CMP (Cancer Center only)    Standing Status:   Future    Expected Date:   10/12/2024    Expiration Date:   01/10/2025   CBC with Differential (Cancer Center Only)    Standing Status:   Future    Expected Date:   10/12/2024    Expiration Date:   01/10/2025    Follow up 4 weeks lab MD  All questions were answered. The patient knows to call the clinic with any problems, questions or concerns.  Call Babara, MD, PhD [facility] Hematology Oncology 09/14/2024      HISTORY OF PRESENTING ILLNESS:   Amber Blevins is a  73 y.o.  female with PMH listed below presents for follow up for treatment of  metastatic breast cancer.   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 Additional metastatic involvement T12, L1 through L3.  No primary tumor site identified. Top differential considerations are Metastatic Disease Unknown primary, less likely Lymphoma or Multiple Myeloma.   2. Superimposed lumbar spine degeneration, including degenerative moderate to severe left L3 and L4 nerve level impingement from disc herniation.      07/02/2021 Imaging   CT chest abdomen pelvis showed innumerable small pulmonary and pleural nodules consistent with diffuse metastasis.  Associated with probable malignant pleural effusion.  Mediastinal and hilar lymphadenopathy consistent with metastatic disease.  Hepatic and bilateral adrenal gland metastasis.  Diffuse extensive destructive metastatic bone disease involving pelvis.   07/12/2021 Cancer Staging   Staging form: Breast, AJCC 8th Edition - Clinical stage from 07/12/2021: Stage IV (rcTX, cNX, pM1, ER+, PR+, HER2-) - Signed by Babara Call, MD on 03/08/2022 Stage prefix: Recurrence   07/17/2021 Initial Diagnosis   Metastatic breast cancer -history of breast cancer, diagnosed in 2010 Left-sided T2 (2.4cm) N0, ER/PR positive, HER2 negative IDC of the breast, s/p lumpectomy by Dr.Meyer at [facility]. Oncotype score of 11 who completed radiation and she finished 10 years of Femara  from 05/2009 and stopped in 2020.  -07/12/2021 patient underwent right thoracentesis.  Cytology was positive for metastatic carcinoma, compatible with breast origin.  ER/PR +, HER2 negative -07/16/2021, patient underwent iliac bone biopsy.  Positive for metastatic carcinoma.   07/21/2021 - 09/16/2021 Chemotherapy   Patient is on Treatment Plan : BREAST Paclitaxel  D1,8,15 q28d     09/04/2021 Imaging   CT chest abdomen pelvis showed improvement of the pulmonary nodularity and thickening in the left and the right lung.  Persistent nodular interstitial and pleural  thickening remains.  Improvement of mediastinal and hilar adenopathy, hepatic metastatic lesions are similar.  Adrenal gland metastasis is similar.  Interval increase in the sclerotic bone metastasis.    09/30/2021 -  Chemotherapy   started on abemaciclib  100 mg twice daily and letrozole .   02/24/2022 Imaging    CT chest abdomen pelvis with contrast showed unchanged bilateral pleural effusion with associated intralobular septal thickening of the lung bases.  Without discrete nodularity consistent with stable appearance of treated pleural and lymphangitic metastatic disease.  Stable or minimally diminished subcentimeter liver lesions.  Unchanged left adrenal metastasis.  Unchanged widespread sclerotic osseous metastatic disease involving the included axial and appendicular skeleton.  No evidence of new metastatic disease in the chest abdomen or pelvis.  Coronary artery disease.   03/06/2022 Procedure   Ultrasound guided thoracentesis, removal of pleural fluid.    05/29/2022 Imaging   CT chest abdomen pelvis 1. Stable CT of the chest, abdomen and pelvis. 2. Unchanged appearance of bilateral pleural effusions, pleural nodularity, and interlobular septal thickening compatible with pleural and lymphangitic metastatic disease. 3. Stable appearance of multifocal sclerotic bone metastases. 4. Multifocal low-attenuation liver lesions are unchanged in the interval. 5. Stable  appearance of left adrenal gland metastases. 6. Stable appearance of small pericardial effusion.7. No new or progressive disease identified.8. Aortic Atherosclerosis (ICD10-I70.0).   06/06/2022 Imaging   Bone scan Constellation of findings are consistent with multifocal osseous metastatic disease throughout the axial and appendicular skeleton.Of note, osseous metastatic disease involves the RIGHT greater than LEFT femur. Consider evaluation with dedicated hip and femur radiographs to evaluate for overall disease burden and potential risk  for pathologic fracture.   08/31/2022 Imaging   CT chest abdomen pelvis w contrast 1. Unchanged small right, trace left pleural effusions with associated diffuse interlobular septal thickening and fine fissural and perilymphatic nodularity throughout the lungs, findings consistent with lymphangitic metastatic disease. 2. Stable or slightly diminished very subtle hypodense lesion of the central right lobe of the liver, hepatic segment VIII measuring 0.6 cm, previously 0.8 cm. Additional unchanged, very subtle hypodense lesion of hepatic segment IV B measuring 1.1 cm. 3. Unchanged widespread sclerotic osseous metastatic disease throughout the included axial and proximal appendicular skeleton, particularly dense in the lower lumbar spine and sacrum. 4. Unchanged left adrenal nodule measuring 1.7 x 1.1 cm. 5. Overall constellation of findings is consistent with stable metastatic disease. 6. Coronary artery disease.    12/01/2022 Miscellaneous   [facility] liquid biopsy - PIK3CA mutation. 0.1% Not enough tissue for tissue NGS   03/08/2023 Imaging   Bone scan showed Multifocal osseous metastatic disease. One new focus along the right side of the skull   08/23/2023 - 09/06/2023 Radiation Therapy   Skull radiation.    12/24/2023 Imaging   CT chest abdomen pelvis wo contrast showed 1. 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 2. New indistinct 0.6 cm medial right lower lobe pulmonary nodule, indeterminate for inflammatory nodule versus pulmonary metastasis. Suggest attention on follow-up chest CT in 3 months. 3. Widespread patchy sclerotic osseous metastatic disease, not appreciably changed. 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 4. Stable small layering right pleural effusion. Stable small pericardial effusion. 5. Three-vessel coronary atherosclerosis. 6. Long-term stability of left adrenal nodule favoring an adenoma. 7. Moderate left colonic  diverticulosis. 8.  Aortic Atherosclerosis (ICD10-I70.0).     Procedure   Liver biopsy Pathology showed metasatic adenocarcinoma Immunohistochemical stains show that the tumor cells are positive for CK7 and GATA3 while they are negative for CK20 and CDX2, consistent with patient's clinical history of primary breast carcinoma.     ER 95%, PR 0%, HER2 low (1+), Ki 67 5% NGS showed PIK3CA mutation, CHEK mutation.    02/07/2024 -  Chemotherapy   Started on Capivasertib  400mg  Q12h on D1-4 of each week   05/18/2024 Imaging   CT chest abdomen pelvis w contrast   1. Interval decrease in size of multiple hypodense liver metastases consistent with treatment response. 2. Unchanged widespread sclerotic osseous metastatic disease, notable for chronic wedge deformities of T7 and T10 as well as very extensive involvement of the sacrum. 3. Trace right pleural effusion, slightly diminished in volume. 4. Emphysema. 5. Coronary artery disease.   Aortic Atherosclerosis (ICD10-I70.0) and Emphysema (ICD10-J43.9).   Metastasis to bone (HCC)  02/04/2022 Initial Diagnosis   Metastasis to bone [facility])    Patient follows up with cardiology for evaluation of bradycardia.  Cardiology notes reviewed.  72-hour Holter monitor revealed revealed predominant sinus rhythm with mean heart rate of 96 bpm, heart rate range 53 to 129 bpm, frequent premature ventricular contractions (27% burden), and intermittent atrial fibrillation (5% burden) with longest episode 3 hours. CHA2DS2-VASc score of 3,  per cardiology recommendation patient was started on Eliquis 5 mg twice daily.     INTERVAL HISTORY Amber Blevins is a 73 y.o. female who has above history reviewed by me today presents for follow up visit for metastatic breast cancer  Discussed the use of AI scribe software for clinical note transcription with the patient, who gave verbal consent to proceed.   She has previously  facial flushing and rash, episode  usually lasts for a few days.  The episodes are characterized by facial flushing, warmth, and redness, primarily on the right side of her face, starting behind her ears, moving across her forehead, and then down the side of her face. The area is tender, hot, and slightly itchy. The episodes occurred from September 10th to 17th and November 12th to the present, with no occurrences in October.  No facial rash since last visit.   Recent she established care with nephrology Denies abdominal pain,nausea or vomiting. Occasional diarrhea.  Since May 2025, she has been on Capivasertib  400mg  Q12h on D1-4 of each week.    .  Review of Systems  Constitutional:  Negative for appetite change, chills, fatigue and fever.  HENT:   Negative for hearing loss and voice change.   Eyes:  Negative for eye problems.  Respiratory:  Negative for chest tightness, cough and shortness of breath.   Cardiovascular:  Negative for chest pain.  Gastrointestinal:  Negative for abdominal distention, abdominal pain and blood in stool.  Endocrine: Negative for hot flashes.  Genitourinary:  Negative for difficulty urinating and frequency.   Musculoskeletal:  Negative for arthralgias.  Skin:  Negative for itching and rash.  Neurological:  Positive for numbness. Negative for extremity weakness.  Hematological:  Negative for adenopathy.  Psychiatric/Behavioral:  Negative for confusion.      MEDICAL HISTORY:  Past Medical History:  Diagnosis Date   A-fib (HCC)    Asthma    Breast cancer (HCC)    Chemotherapy-induced neuropathy    COPD (chronic obstructive pulmonary disease) (HCC)    Dyspnea    Family history of adverse reaction to anesthesia    brother has problem with coming out of anesthesia   Grade I diastolic dysfunction    High cholesterol    Hypertension    Personal history of radiation therapy    Pre-diabetes    Stage 3a chronic kidney disease (CKD) (HCC)     SURGICAL HISTORY: Past Surgical History:   Procedure Laterality Date   BREAST LUMPECTOMY Left    2010   COLONOSCOPY     EYE SURGERY Left    Cataract surgery   PORTACATH PLACEMENT N/A 08/01/2021   Procedure: INSERTION PORT-A-CATH;  Surgeon: Rodolph Romano, MD;  Location: [facility] ORS;  Service: General;  Laterality: N/A;   thorocentesis Right 07/10/2021   and another on 07/25/21    SOCIAL HISTORY: Social History   Socioeconomic History   Marital status: Married    Spouse name: Not on file   Number of children: Not on file   Years of education: Not on file   Highest education level: Not on file  Occupational History   Not on file  Tobacco Use   Smoking status: Former    Current packs/day: 0.00    Average packs/day: 1 pack/day for 25.0 years (25.0 ttl pk-yrs)    Types: Cigarettes    Start date: 49    Quit date: 48    Years since quitting: 35.9   Smokeless tobacco: Never  Vaping Use  Vaping status: Never Used  Substance and Sexual Activity   Alcohol use: Not Currently   Drug use: Never   Sexual activity: Not on file  Other Topics Concern   Not on file  Social History Narrative   Not on file   Social Drivers of Health   Tobacco Use: Medium Risk (09/14/2024)   Patient History    Smoking Tobacco Use: Former    Smokeless Tobacco Use: Never    Passive Exposure: Not on file  Financial Resource Strain: Low Risk  (07/26/2024)   Received from [facility]   Overall Financial Resource Strain (CARDIA)    Difficulty of Paying Living Expenses: Not hard at all  Food Insecurity: No Food Insecurity (07/26/2024)   Received from [facility]   Epic    Within the past 12 months, you worried that your food would run out before you got the money to buy more.: Never true    Within the past 12 months, the food you bought just didn't last and you didn't have money to get more.: Never true  Transportation Needs: No Transportation Needs (07/26/2024)   Received [facility] - [facility]    In the past 12 months, has lack of transportation kept you from medical appointments or from getting medications?: No    Lack of Transportation (Non-Medical): No  Physical Activity: Not on file  Stress: Not on file  Social Connections: Not on file  Intimate Partner Violence: Not on file  Depression (PHQ2-9): Low Risk (07/20/2024)   Depression (PHQ2-9)    PHQ-2 Score: 0  Alcohol Screen: Not on file  Housing: Low Risk  (07/26/2024)   Received from [facility]   Epic    In the last 12 months, was there a time when you were not able to pay the mortgage or rent on time?: No    In the past 12 months, how many times have you moved where you were living?: 0    At any time in the past 12 months, were you homeless or living in a shelter (including now)?: No  Utilities: Not At Risk (07/26/2024)   Received from [facility]   Epic    In the past 12 months has the electric, gas, oil, or water company threatened to shut off services in your home?: No  Health Literacy: Not on file    FAMILY HISTORY: Family History  Problem Relation Age of Onset   Ovarian cancer Mother 13   Lung cancer Mother 87   Diabetes Father    Heart disease Father    Parkinson's disease Father    Bladder Cancer Brother 48   Pulmonary disease Brother    Rheumatic fever Brother    Prostate cancer Cousin 55   Prostate cancer Cousin 54   Breast cancer Neg Hx     ALLERGIES:  is allergic to green tea (camellia sinensis), melaleuca viridiflora, tea tree oil, and lisinopril.  MEDICATIONS:  Current Outpatient Medications  Medication Sig Dispense Refill   acetaminophen  (TYLENOL ) 650 MG CR tablet Take 650 mg by mouth every 8 (eight) hours as needed for pain.     apixaban (ELIQUIS) 5 MG TABS tablet Take 5 mg by mouth 2 (two) times daily.     atorvastatin  (LIPITOR) 40 MG tablet Take 40 mg by mouth at bedtime.     Boswellia-Glucosamine-Vit D  (OSTEO BI-FLEX-GLUCOS/5-LOXIN) TABS Take 1 capsule by mouth in the morning  and at bedtime.     Calcium  Carb-Cholecalciferol 600-400 MG-UNIT TABS Take 1 tablet by mouth 2 (two) times daily.     capivasertib  (TRUQAP ) 200 MG tablet Take 2 tablets (400 mg total) by mouth 2 (two) times daily. Take for 4 days, then hold for 3 days. Repeat every 7 days. 64 tablet 0   cetirizine (ZYRTEC) 10 MG chewable tablet Chew 10 mg by mouth daily as needed for allergies.     clobetasol cream (TEMOVATE) 0.05 % Apply 1 Application topically as needed.     cyanocobalamin 1000 MCG tablet Take 1,000 mcg by mouth daily.     diphenoxylate -atropine  (LOMOTIL ) 2.5-0.025 MG tablet Take 1 tablet by mouth 4 (four) times daily as needed for diarrhea or loose stools. 30 tablet 0   Docusate Sodium (DSS) 100 MG CAPS Take 1 capsule by mouth daily.     ELIQUIS 5 MG TABS tablet Take 5 mg by mouth 2 (two) times daily.     fulvestrant  (FASLODEX ) 250 MG/5ML injection Inject 250 mg into the muscle every 30 (thirty) days. One injection each buttock over 1-2 minutes. Warm prior to use.     gabapentin  (NEURONTIN ) 300 MG capsule TAKE 2 CAPSULES BY MOUTH IN THE MORNING AND 3 CAPSULES IN THE EVENING 450 capsule 0   Iron-Vitamin C (VITRON-C) 65-125 MG TABS Take 1 tablet by mouth daily.     lidocaine -prilocaine  (EMLA ) cream Apply small amount to port and cover with saran wrap 1-2 hours prior to port access 30 g 5   Loperamide HCl (IMODIUM PO) Take by mouth.     losartan (COZAAR) 50 MG tablet Take 50 mg by mouth daily.     magnesium chloride (SLOW-MAG) 64 MG TBEC SR tablet Take 1 tablet by mouth at bedtime.     melatonin (MELATONIN MAXIMUM STRENGTH) 5 MG TABS Take 1 tablet by mouth at bedtime as needed.     nystatin cream (MYCOSTATIN) Apply 1 Application topically as needed for dry skin.     ondansetron  (ZOFRAN ) 8 MG tablet Take 1 tablet (8 mg total) by mouth every 8 (eight) hours as needed for nausea or vomiting. 20 tablet 1   Turmeric (QC TUMERIC  COMPLEX PO) Take 1 capsule by mouth at bedtime.     Zoledronic  Acid (ZOMETA  IV) Inject into the vein every 3 (three) months.     No current facility-administered medications for this visit.   Facility-Administered Medications Ordered in Other Visits  Medication Dose Route Frequency Provider Last Rate Last Admin   fulvestrant  (FASLODEX ) injection 500 mg  500 mg Intramuscular Once Babara Call, MD       sodium chloride  flush (NS) 0.9 % injection 10 mL  10 mL Intravenous Once Babara Call, MD         PHYSICAL EXAMINATION: ECOG PERFORMANCE STATUS: 1 - Symptomatic but completely ambulatory Vitals:   09/14/24 0852 09/14/24 0905  BP: (!) 147/81 (!) 147/78  Pulse: 88   Resp: 18   Temp: (!) 97.3 F (36.3 C)   SpO2: 100%     Filed Weights   09/14/24 0852  Weight: 198 lb 9.6 oz (90.1 kg)     Physical Exam Constitutional:      General: She is not in acute distress.    Appearance: She is not diaphoretic.  HENT:     Head: Normocephalic and atraumatic.     Nose: Nose normal.     Mouth/Throat:     Pharynx: No oropharyngeal exudate.  Eyes:  General: No scleral icterus.    Pupils: Pupils are equal, round, and reactive to light.  Cardiovascular:     Rate and Rhythm: Normal rate and regular rhythm.     Heart sounds: No murmur heard. Pulmonary:     Effort: Pulmonary effort is normal. No respiratory distress.     Comments: Mild decreased breath sound bilaterally.. L>R Abdominal:     General: There is no distension.     Palpations: Abdomen is soft.     Tenderness: There is no abdominal tenderness.  Musculoskeletal:        General: Normal range of motion.     Cervical back: Normal range of motion and neck supple.  Skin:    General: Skin is warm and dry.     Findings: No erythema.  Neurological:     Mental Status: She is alert and oriented to person, place, and time.     Cranial Nerves: No cranial nerve deficit.     Motor: No abnormal muscle tone.     Coordination: Coordination  normal.  Psychiatric:        Mood and Affect: Affect normal.       LABORATORY DATA:  I have reviewed the data as listed     Latest Ref Rng & Units 09/14/2024    8:41 AM 08/17/2024    8:10 AM 07/20/2024    8:19 AM  CBC  WBC 4.0 - 10.5 K/uL 7.8  8.7  7.1   Hemoglobin 12.0 - 15.0 g/dL 89.5  89.6  89.0   Hematocrit 36.0 - 46.0 % 32.4  32.5  34.1   Platelets 150 - 400 K/uL 248  243  238       Latest Ref Rng & Units 08/17/2024    8:10 AM 07/20/2024    8:19 AM 06/22/2024    8:05 AM  CMP  Glucose 70 - 99 mg/dL 863  820  834   BUN 8 - 23 mg/dL 17  21  26    Creatinine 0.44 - 1.00 mg/dL 8.59  8.47  8.54   Sodium 135 - 145 mmol/L 139  137  137   Potassium 3.5 - 5.1 mmol/L 3.8  3.7  3.9   Chloride 98 - 111 mmol/L 105  104  106   CO2 22 - 32 mmol/L 25  25  24    Calcium  8.9 - 10.3 mg/dL 9.1  9.0  8.9   Total Protein 6.5 - 8.1 g/dL 6.8  6.9  6.9   Total Bilirubin 0.0 - 1.2 mg/dL 0.7  0.8  0.9   Alkaline Phos 38 - 126 U/L 67  68  72   AST 15 - 41 U/L 22  21  24    ALT 0 - 44 U/L 18  16  20       RADIOGRAPHIC STUDIES: I have personally reviewed the radiological images as listed and agreed with the findings in the report. US  RENAL Result Date: 08/22/2024 EXAM: US  Retroperitoneum Complete, Renal. 08/17/2024 10:33:36 AM TECHNIQUE: Real-time ultrasonography of the retroperitoneum renal was performed. COMPARISON: CT dated 08/10/2024. CLINICAL HISTORY: Chronic kidney disease 3a. FINDINGS: FINDINGS: RIGHT KIDNEY/URETER: Right kidney measures 9.2 x 4.6 x 4.6 cm. Calculated volume is 102 ml. Normal cortical echogenicity. No hydronephrosis. No mass. LEFT KIDNEY/URETER: Left kidney measures 10.2 x 5.3 x 4.0 cm. Calculated volume is 113 ml. A left renal pelvic cyst is noted, stable from prior CT, measuring up to 2.2 cm. Normal cortical echogenicity. No hydronephrosis. BLADDER: Pre-void bladder volume is 185  mL. Post-void bladder volume is 17 mL. IMPRESSION: 1. Stable left renal pelvic cyst measuring  up to 2.2 cm, unchanged from prior, no follow-up imaging required for this benign finding. Electronically signed by: Oneil Devonshire MD 08/22/2024 09:36 PM EST RP Workstation: [id number]   MR Brain W Wo Contrast Result Date: 08/13/2024 EXAM: MRI BRAIN WITH AND WITHOUT CONTRAST 08/10/2024 10:16:00 AM TECHNIQUE: Multiplanar multisequence MRI of the head/brain was performed with and without the administration of intravenous contrast. CONTRAST: 8 mL of Gadavist . COMPARISON: MR Head 01/21/2024. CLINICAL HISTORY: Metastatic breast cancer, follow up evaluation. FINDINGS: BRAIN AND VENTRICLES: No acute infarct. No acute intracranial hemorrhage. No mass effect or midline shift. No hydrocephalus. The sella is unremarkable. Normal flow voids. No contrast enhancing lesions within the brain parenchyma. Left ocular lens replacement. ORBITS: Left ocular lens replacement. No acute abnormality. SINUSES: No acute abnormality. BONES AND SOFT TISSUES: Unchanged appearance of the calvarial lesion along the right lateral aspect of the coronal suture measuring 21 mm in AP length. No new enhancing calvarial lesions. Normal bone marrow signal and enhancement. No acute soft tissue abnormality. IMPRESSION: 1. No intracranial metastatic disease identified. 2. 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 No new enhancing calvarial lesions. 3. No acute intracranial abnormality. Electronically signed by: Franky Stanford MD 08/13/2024 01:06 PM EST RP Workstation: [id number]   CT CHEST ABDOMEN PELVIS WO CONTRAST Result Date: 08/12/2024 CLINICAL DATA:  Metastatic breast cancer. Restaging. * Tracking Code: BO * EXAM: CT CHEST, ABDOMEN AND PELVIS WITHOUT CONTRAST TECHNIQUE: Multidetector CT imaging of the chest, abdomen and pelvis was performed following the standard protocol without IV contrast. RADIATION DOSE REDUCTION: This exam was performed according to the departmental dose-optimization program which includes automated exposure  control, adjustment of the mA and/or kV according to patient size and/or use of iterative reconstruction technique. COMPARISON:  05/18/2024 FINDINGS: CT CHEST FINDINGS Cardiovascular: The heart size is normal. No substantial pericardial effusion. Coronary artery calcification is evident. Mild atherosclerotic calcification is noted in the wall of the thoracic aorta. Right Port-A-Cath tip is at the SVC/RA junction. Mediastinum/Nodes: No mediastinal lymphadenopathy. No evidence for gross hilar lymphadenopathy although assessment is limited by the lack of intravenous contrast on the current study. The esophagus has normal imaging features. There is no axillary lymphadenopathy. Lungs/Pleura: Subtle changes of centrilobular emphysema. Subtle irregularity of the major fissures is unchanged. No new suspicious pulmonary nodule or mass. Trace right pleural effusion evident but decreased since prior. Musculoskeletal: Similar appearance of sclerotic bone lesions. CT ABDOMEN PELVIS FINDINGS Hepatobiliary: Subtle mineralized liver lesions are stable (see anterior liver on 56/2), compatible with treated metastases. The 10 mm low-density lesion seen anterior right liver previously is not evident on the current study. There is no evidence for gallstones, gallbladder wall thickening, or pericholecystic fluid. No intrahepatic or extrahepatic biliary dilation. Pancreas: No focal mass lesion. No dilatation of the main duct. No intraparenchymal cyst. No peripancreatic edema. Spleen: No splenomegaly. No suspicious focal mass lesion. Adrenals/Urinary Tract: Stable 17 mm left adrenal nodule. Right adrenal gland unremarkable. Right kidney unremarkable. Central sinus cysts again noted left kidney. No hydroureter. The urinary bladder appears normal for the degree of distention. Stomach/Bowel: Stomach is unremarkable. No gastric wall thickening. No evidence of outlet obstruction. Duodenum is normally positioned as is the ligament of Treitz. No  small bowel wall thickening. No small bowel dilatation. The terminal ileum is normal. The appendix is not well visualized, but there is no edema or inflammation in the region of the cecal tip to  suggest appendicitis. No gross colonic mass. No colonic wall thickening. Diverticular changes are noted in the left colon without evidence of diverticulitis. Vascular/Lymphatic: There is moderate atherosclerotic calcification of the abdominal aorta without aneurysm. There is no gastrohepatic or hepatoduodenal ligament lymphadenopathy. No retroperitoneal or mesenteric lymphadenopathy. No pelvic sidewall lymphadenopathy. Reproductive: Unremarkable. Other: No intraperitoneal free fluid. Musculoskeletal: Pelvic floor laxity evident. Multiple sclerotic bone lesions again identified with diffuse involvement of the sacrum, similar to prior. IMPRESSION: 1. Stable exam. No new or progressive findings to suggest new or progressive metastatic disease in the chest, abdomen, or pelvis. 2. Stable appearance of sclerotic bone lesions. 3. Stable 17 mm left adrenal nodule compatible with benign adrenal adenoma. No followup imaging is recommended. 4. Trace right pleural effusion, decreased since prior. 5. Aortic Atherosclerosis (ICD10-I70.0) and Emphysema (ICD10-J43.9). Electronically Signed   By: Camellia Candle M.D.   On: 08/12/2024 08:03

## 2024-09-14 NOTE — Assessment & Plan Note (Signed)
 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 Continue calcium  and vitamin D supplementation. 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    Zometa   3mg  every 3 months. next due in Jan 2026

## 2024-09-15 LAB — PARATHYROID HORMONE, INTACT (NO CA): PTH: 23 pg/mL (ref 15–65)

## 2024-09-15 LAB — CANCER ANTIGEN 15-3: CA 15-3: 14.8 U/mL (ref 0.0–25.0)

## 2024-09-15 LAB — CANCER ANTIGEN 27.29: CA 27.29: 12.6 U/mL (ref 0.0–38.6)

## 2024-09-18 ENCOUNTER — Telehealth: Payer: Self-pay | Admitting: Pharmacy Technician

## 2024-09-18 ENCOUNTER — Other Ambulatory Visit (HOSPITAL_COMMUNITY): Payer: Self-pay

## 2024-09-18 ENCOUNTER — Encounter: Payer: Self-pay | Admitting: Anesthesiology

## 2024-09-18 ENCOUNTER — Encounter: Payer: Self-pay | Admitting: Ophthalmology

## 2024-09-18 ENCOUNTER — Encounter: Admission: RE | Payer: Self-pay

## 2024-09-18 ENCOUNTER — Ambulatory Visit: Payer: Self-pay | Admitting: Anesthesiology

## 2024-09-18 ENCOUNTER — Ambulatory Visit
Admission: RE | Admit: 2024-09-18 | Discharge: 2024-09-18 | Disposition: A | Discharge status: Home / Self Care | Attending: Ophthalmology | Admitting: Ophthalmology

## 2024-09-18 ENCOUNTER — Other Ambulatory Visit: Payer: Self-pay

## 2024-09-18 DIAGNOSIS — I129 Hypertensive chronic kidney disease with stage 1 through stage 4 chronic kidney disease, or unspecified chronic kidney disease: Secondary | ICD-10-CM | POA: Insufficient documentation

## 2024-09-18 DIAGNOSIS — F419 Anxiety disorder, unspecified: Secondary | ICD-10-CM | POA: Insufficient documentation

## 2024-09-18 DIAGNOSIS — Z87891 Personal history of nicotine dependence: Secondary | ICD-10-CM | POA: Insufficient documentation

## 2024-09-18 DIAGNOSIS — Z7901 Long term (current) use of anticoagulants: Secondary | ICD-10-CM | POA: Diagnosis not present

## 2024-09-18 DIAGNOSIS — E7800 Pure hypercholesterolemia, unspecified: Secondary | ICD-10-CM | POA: Insufficient documentation

## 2024-09-18 DIAGNOSIS — R0602 Shortness of breath: Secondary | ICD-10-CM | POA: Diagnosis not present

## 2024-09-18 DIAGNOSIS — N1831 Chronic kidney disease, stage 3a: Secondary | ICD-10-CM | POA: Insufficient documentation

## 2024-09-18 DIAGNOSIS — J449 Chronic obstructive pulmonary disease, unspecified: Secondary | ICD-10-CM | POA: Insufficient documentation

## 2024-09-18 DIAGNOSIS — Z79899 Other long term (current) drug therapy: Secondary | ICD-10-CM | POA: Insufficient documentation

## 2024-09-18 DIAGNOSIS — Z853 Personal history of malignant neoplasm of breast: Secondary | ICD-10-CM | POA: Diagnosis not present

## 2024-09-18 DIAGNOSIS — H2511 Age-related nuclear cataract, right eye: Secondary | ICD-10-CM | POA: Insufficient documentation

## 2024-09-18 DIAGNOSIS — Z923 Personal history of irradiation: Secondary | ICD-10-CM | POA: Diagnosis not present

## 2024-09-18 HISTORY — DX: Acquired absence of left breast and nipple: Z90.12

## 2024-09-18 HISTORY — PX: CATARACT EXTRACTION W/PHACO: SHX586

## 2024-09-18 SURGERY — PHACOEMULSIFICATION, CATARACT, WITH IOL INSERTION
Anesthesia: Monitor Anesthesia Care | Site: Eye | Laterality: Right

## 2024-09-18 MED ORDER — PHENYLEPHRINE HCL 10 % OP SOLN
OPHTHALMIC | Status: AC
  Filled 2024-09-18: qty 5

## 2024-09-18 MED ORDER — SIGHTPATH DOSE#1 BSS IO SOLN
INTRAOCULAR | Status: DC | PRN
  Administered 2024-09-18: 100 mL via OPHTHALMIC

## 2024-09-18 MED ORDER — FENTANYL CITRATE (PF) 100 MCG/2ML IJ SOLN
INTRAMUSCULAR | Status: DC | PRN
  Administered 2024-09-18: 50 ug via INTRAVENOUS

## 2024-09-18 MED ORDER — CYCLOPENTOLATE HCL 2 % OP SOLN
1.0000 [drp] | OPHTHALMIC | Status: DC | PRN
  Administered 2024-09-18 (×2): 1 [drp] via OPHTHALMIC

## 2024-09-18 MED ORDER — MOXIFLOXACIN HCL 0.5 % OP SOLN
OPHTHALMIC | Status: DC | PRN
  Administered 2024-09-18: .2 mL via OPHTHALMIC

## 2024-09-18 MED ORDER — FENTANYL CITRATE (PF) 100 MCG/2ML IJ SOLN
INTRAMUSCULAR | Status: AC
  Filled 2024-09-18: qty 2

## 2024-09-18 MED ORDER — TETRACAINE HCL 0.5 % OP SOLN
OPHTHALMIC | Status: AC
  Filled 2024-09-18: qty 4

## 2024-09-18 MED ORDER — MIDAZOLAM HCL 2 MG/2ML IJ SOLN
INTRAMUSCULAR | Status: AC
  Filled 2024-09-18: qty 2

## 2024-09-18 MED ORDER — PHENYLEPHRINE HCL 10 % OP SOLN
1.0000 [drp] | OPHTHALMIC | Status: AC | PRN
  Administered 2024-09-18 (×3): 1 [drp] via OPHTHALMIC

## 2024-09-18 MED ORDER — TETRACAINE HCL 0.5 % OP SOLN
1.0000 [drp] | OPHTHALMIC | Status: DC | PRN
  Administered 2024-09-18 (×3): 1 [drp] via OPHTHALMIC

## 2024-09-18 MED ORDER — SIGHTPATH DOSE#1 NA HYALUR & NA CHOND-NA HYALUR IO KIT
PACK | INTRAOCULAR | Status: DC | PRN
  Administered 2024-09-18: 1 via OPHTHALMIC

## 2024-09-18 MED ORDER — LIDOCAINE HCL (PF) 2 % IJ SOLN
INTRAOCULAR | Status: DC | PRN
  Administered 2024-09-18: 4 mL via INTRAOCULAR

## 2024-09-18 MED ORDER — CYCLOPENTOLATE HCL 2 % OP SOLN
OPHTHALMIC | Status: AC
  Filled 2024-09-18: qty 2

## 2024-09-18 MED ORDER — SIGHTPATH DOSE#1 BSS IO SOLN
INTRAOCULAR | Status: DC | PRN
  Administered 2024-09-18: 15 mL via INTRAOCULAR

## 2024-09-18 MED ADMIN — Midazolam HCl Inj PF 2 MG/2ML (Base Equivalent): 2 mg | INTRAVENOUS | NDC 00409000125

## 2024-09-18 SURGICAL SUPPLY — 9 items
DISSECTOR HYDRO NUCLEUS 50X22 (MISCELLANEOUS) ×1 IMPLANT
FEE CATARACT SUITE SIGHTPATH (MISCELLANEOUS) ×1 IMPLANT
GLOVE PI ULTRA LF STRL 7.5 (GLOVE) ×1 IMPLANT
GLOVE SURG SYN 6.5 PF PI BL (GLOVE) ×1 IMPLANT
GLOVE SURG SYN 8.5 PF PI BL (GLOVE) ×1 IMPLANT
LENS IOL TECNIS EYHANCE 23.5 (Intraocular Lens) IMPLANT
NDL FILTER BLUNT 18X1 1/2 (NEEDLE) ×1 IMPLANT
NEEDLE FILTER BLUNT 18X1 1/2 (NEEDLE) ×1 IMPLANT
SYR 3ML LL SCALE MARK (SYRINGE) ×1 IMPLANT

## 2024-09-18 NOTE — Op Note (Signed)
 OPERATIVE NOTE  ALLYSA GOVERNALE [id number] 09/18/2024   PREOPERATIVE DIAGNOSIS:  Nuclear sclerotic cataract right eye.  H25.11   POSTOPERATIVE DIAGNOSIS:    Nuclear sclerotic cataract right eye.     PROCEDURE:  Phacoemusification with posterior chamber intraocular lens placement of the right eye   LENS:   Implant Name Type Inv. Item Serial No. Manufacturer Lot No. LRB No. Used Action  LENS IOL TECNIS EYHANCE 23.5 - [id number] Intraocular Lens LENS IOL TECNIS EYHANCE 23.5 [id number] [employer]  Right 1 Implanted       Procedures: PHACOEMULSIFICATION, CATARACT, WITH IOL INSERTION 9.49 00:49.0 (Right)  SURGEON:  Adine Novak, MD, MPH  ANESTHESIOLOGIST: Anesthesiologist: Ola Donny BROCKS, MD CRNA: Levy Harvey, CRNA   ANESTHESIA:  Topical with tetracaine  drops augmented with 1% preservative-free intracameral lidocaine .  ESTIMATED BLOOD LOSS: less than 1 mL.   COMPLICATIONS:  None.   DESCRIPTION OF PROCEDURE:  The patient was identified in the holding room and transported to the operating room and placed in the supine position under the operating microscope.  The right eye was identified as the operative eye and it was prepped and draped in the usual sterile ophthalmic fashion.   A 1.0 millimeter clear-corneal paracentesis was made at the 10:30 position. 0.5 ml of preservative-free 1% lidocaine  with epinephrine  was injected into the anterior chamber.  The anterior chamber was filled with viscoelastic.  A 2.4 millimeter keratome was used to make a near-clear corneal incision at the 8:00 position.  A curvilinear capsulorrhexis was made with a cystotome and capsulorrhexis forceps.  Balanced salt solution was used to hydrodissect and hydrodelineate the nucleus.   Phacoemulsification was then used in stop and chop fashion to remove the lens nucleus and epinucleus.  The remaining cortex was then removed using the irrigation and aspiration handpiece. Viscoelastic was then placed into the  capsular bag to distend it for lens placement.  A lens was then injected into the capsular bag.  The remaining viscoelastic was aspirated.   Wounds were hydrated with balanced salt solution.  The anterior chamber was inflated to a physiologic pressure with balanced salt solution.   Intracameral vigamox  0.1 mL undiluted was injected into the eye and a drop placed onto the ocular surface.  No wound leaks were noted.  The patient was taken to the recovery room in stable condition without complications of anesthesia or surgery  Adine Novak 09/18/2024, 10:41 AM

## 2024-09-18 NOTE — H&P (Signed)
 [facility]   Primary Care Physician:  Jyl Railing, MD Ophthalmologist: Dr. Adine Novak  Pre-Procedure History & Physical: HPI:  Amber Blevins is a 73 y.o. female here for cataract surgery.   Past Medical History:  Diagnosis Date   A-fib (HCC)    Asthma    Breast cancer (HCC)    Chemotherapy-induced neuropathy    COPD (chronic obstructive pulmonary disease) (HCC)    Dyspnea    Family history of adverse reaction to anesthesia    brother has problem with coming out of anesthesia   Grade I diastolic dysfunction    High cholesterol    Hypertension    Personal history of radiation therapy    Pre-diabetes    S/P left mastectomy    Stage 3a chronic kidney disease (CKD) (HCC)     Past Surgical History:  Procedure Laterality Date   BREAST LUMPECTOMY Left    2010   COLONOSCOPY     EYE SURGERY Left    Cataract surgery   PORTACATH PLACEMENT N/A 08/01/2021   Procedure: INSERTION PORT-A-CATH;  Surgeon: Rodolph Romano, MD;  Location: [facility] ORS;  Service: General;  Laterality: N/A;   thorocentesis Right 07/10/2021   and another on 07/25/21    Prior to Admission medications  Medication Sig Start Date End Date Taking? Authorizing Provider  acetaminophen  (TYLENOL ) 650 MG CR tablet Take 650 mg by mouth every 8 (eight) hours as needed for pain.   Yes [provider]  apixaban (ELIQUIS) 5 MG TABS tablet Take 5 mg by mouth 2 (two) times daily. 09/04/24  Yes [provider]  atorvastatin  (LIPITOR) 40 MG tablet Take 40 mg by mouth at bedtime. 11/25/20  Yes [provider]  Boswellia-Glucosamine-Vit D (OSTEO BI-FLEX-GLUCOS/5-LOXIN) TABS Take 1 capsule by mouth in the morning and at bedtime.   Yes [provider]  Calcium  Carb-Cholecalciferol 600-400 MG-UNIT TABS Take 1 tablet by mouth 2 (two) times daily.   Yes [provider]  capivasertib  (TRUQAP ) 200 MG tablet Take 2 tablets (400 mg total) by mouth 2 (two) times daily. Take  for 4 days, then hold for 3 days. Repeat every 7 days. 09/06/24  Yes Babara Call, MD  cetirizine (ZYRTEC) 10 MG chewable tablet Chew 10 mg by mouth daily as needed for allergies.   Yes [provider]  clobetasol cream (TEMOVATE) 0.05 % Apply 1 Application topically as needed.   Yes [provider]  cyanocobalamin 1000 MCG tablet Take 1,000 mcg by mouth daily.   Yes [provider]  diphenoxylate -atropine  (LOMOTIL ) 2.5-0.025 MG tablet Take 1 tablet by mouth 4 (four) times daily as needed for diarrhea or loose stools. 02/02/24  Yes Babara Call, MD  Docusate Sodium (DSS) 100 MG CAPS Take 1 capsule by mouth daily.   Yes [provider]  ELIQUIS 5 MG TABS tablet Take 5 mg by mouth 2 (two) times daily. 06/02/22  Yes [provider]  fulvestrant  (FASLODEX ) 250 MG/5ML injection Inject 250 mg into the muscle every 30 (thirty) days. One injection each buttock over 1-2 minutes. Warm prior to use.   Yes [provider]  gabapentin  (NEURONTIN ) 300 MG capsule TAKE 2 CAPSULES BY MOUTH IN THE MORNING AND 3 CAPSULES IN THE EVENING 09/04/24  Yes Babara Call, MD  Iron-Vitamin C (VITRON-C) 65-125 MG TABS Take 1 tablet by mouth daily.   Yes [provider]  lidocaine -prilocaine  (EMLA ) cream Apply small amount to port and cover with saran wrap 1-2 hours prior to port  access 04/27/23  Yes Babara Call, MD  Loperamide HCl (IMODIUM PO) Take by mouth.   Yes [provider]  losartan (COZAAR) 50 MG tablet Take 50 mg by mouth daily. 03/03/22  Yes [provider]  magnesium chloride (SLOW-MAG) 64 MG TBEC SR tablet Take 1 tablet by mouth at bedtime.   Yes [provider]  melatonin (MELATONIN MAXIMUM STRENGTH) 5 MG TABS Take 1 tablet by mouth at bedtime as needed.   Yes [provider]  nystatin cream (MYCOSTATIN) Apply 1 Application topically as needed for dry skin.   Yes [provider]  ondansetron  (ZOFRAN ) 8 MG tablet Take 1 tablet (8 mg  total) by mouth every 8 (eight) hours as needed for nausea or vomiting. 02/02/24  Yes Leonard, Alyson N, RPH-CPP  Turmeric (QC TUMERIC COMPLEX PO) Take 1 capsule by mouth at bedtime.   Yes [provider]  Zoledronic  Acid (ZOMETA  IV) Inject into the vein every 3 (three) months.   Yes [provider]    Allergies as of 08/31/2024 - Review Complete 08/17/2024  Allergen Reaction Noted   Green tea (camellia sinensis) Hives 04/03/2013   Melaleuca viridiflora Hives 04/03/2013   Tea tree oil Hives 07/29/2021   Lisinopril Other (See Comments) 05/23/2020    Family History  Problem Relation Age of Onset   Ovarian cancer Mother 49   Lung cancer Mother 71   Diabetes Father    Heart disease Father    Parkinson's disease Father    Bladder Cancer Brother 38   Pulmonary disease Brother    Rheumatic fever Brother    Prostate cancer Cousin 19   Prostate cancer Cousin 59   Breast cancer Neg Hx     Social History   Socioeconomic History   Marital status: Married    Spouse name: Not on file   Number of children: Not on file   Years of education: Not on file   Highest education level: Not on file  Occupational History   Not on file  Tobacco Use   Smoking status: Former    Current packs/day: 0.00    Average packs/day: 1 pack/day for 25.0 years (25.0 ttl pk-yrs)    Types: Cigarettes    Start date: 50    Quit date: 38    Years since quitting: 35.9   Smokeless tobacco: Never  Vaping Use   Vaping status: Never Used  Substance and Sexual Activity   Alcohol use: Not Currently   Drug use: Never   Sexual activity: Not on file  Other Topics Concern   Not on file  Social History Narrative   Not on file   Social Drivers of Health   Tobacco Use: Medium Risk (09/18/2024)   Patient History    Smoking Tobacco Use: Former    Smokeless Tobacco Use: Never    Passive Exposure: Not on Actuary Strain: Low Risk  (07/26/2024)   Received from [facility]   Overall Financial Resource Strain (CARDIA)    Difficulty of Paying Living Expenses: Not hard at all  Food Insecurity: No Food Insecurity (07/26/2024)   Received from [facility]   Epic    Within the past 12 months, you worried that your food would run out before you got the money to buy more.: Never true    Within the past 12 months, the food you bought just didn't last and you didn't have money to get more.: Never true  Transportation Needs:  No Transportation Needs (07/26/2024)   Received [facility] - Transportation    In the past 12 months, has lack of transportation kept you from medical appointments or from getting medications?: No    Lack of Transportation (Non-Medical): No  Physical Activity: Not on file  Stress: Not on file  Social Connections: Not on file  Intimate Partner Violence: Not on file  Depression (PHQ2-9): Low Risk (07/20/2024)   Depression (PHQ2-9)    PHQ-2 Score: 0  Alcohol Screen: Not on file  Housing: Low Risk  (07/26/2024)   Received from [facility]   Epic    In the last 12 months, was there a time when you were not able to pay the mortgage or rent on time?: No    In the past 12 months, how many times have you moved where you were living?: 0    At any time in the past 12 months, were you homeless or living in a shelter (including now)?: No  Utilities: Not At Risk (07/26/2024)   Received from [facility]   Epic    In the past 12 months has the electric, gas, oil, or water company threatened to shut off services in your home?: No  Health Literacy: Not on file    Review of Systems: See HPI, otherwise negative ROS  Physical Exam: BP 139/80   Pulse 70   Temp 99.7 F (37.6 C) (Oral)   Resp 20   Ht 5' 4 (1.626 m)   Wt 90.3 kg   SpO2 99%   BMI 34.16 kg/m  General:   Alert, cooperative. Head:  Normocephalic and atraumatic. Respiratory:  Normal work  of breathing. Cardiovascular:  NAD  Impression/Plan: Amber Blevins is here for cataract surgery.  Risks, benefits, limitations, and alternatives regarding cataract surgery have been reviewed with the patient.  Questions have been answered.  All parties agreeable.   Adine Novak, MD  09/18/2024, 10:16 AM

## 2024-09-18 NOTE — Transfer of Care (Signed)
 Immediate Anesthesia Transfer of Care Note  Patient: Amber Blevins  Procedure(s) Performed: PHACOEMULSIFICATION, CATARACT, WITH IOL INSERTION 9.49 00:49.0 (Right: Eye)  Patient Location: PACU  Anesthesia Type: MAC  Level of Consciousness: awake, alert  and patient cooperative  Airway and Oxygen Therapy: Patient Spontanous Breathing and Patient connected to supplemental oxygen  Post-op Assessment: Post-op Vital signs reviewed, Patient's Cardiovascular Status Stable, Respiratory Function Stable, Patent Airway and No signs of Nausea or vomiting  Post-op Vital Signs: Reviewed and stable  Complications: No notable events documented.

## 2024-09-18 NOTE — Telephone Encounter (Signed)
 Oral Oncology Patient Advocate Encounter   Re-authorization   Received notification that prior authorization for Truqap  is required.   PA submitted on CMM via Latent Key [id number] Status is pending     Kline Bulthuis (Patty) Chet Burnet, CPhT  [facility], [place], [place] [facility] - Oral Chemotherapy Patient Advocate Specialist III Phone: [phone]  Fax: [phone]

## 2024-09-18 NOTE — Anesthesia Postprocedure Evaluation (Signed)
"   Anesthesia Post Note  Patient: PAISLEA HATTON  Procedure(s) Performed: PHACOEMULSIFICATION, CATARACT, WITH IOL INSERTION 9.49 00:49.0 (Right: Eye)  Patient location during evaluation: PACU Anesthesia Type: MAC Level of consciousness: awake and alert Pain management: pain level controlled Vital Signs Assessment: post-procedure vital signs reviewed and stable Respiratory status: spontaneous breathing, nonlabored ventilation, respiratory function stable and patient connected to nasal cannula oxygen Cardiovascular status: stable and blood pressure returned to baseline Postop Assessment: no apparent nausea or vomiting Anesthetic complications: no   No notable events documented.   Last Vitals:  Vitals:   09/18/24 1043 09/18/24 1048  BP: 119/74 (!) 128/90  Pulse: 84 86  Resp: 14 13  Temp: (!) 36.1 C (!) 36.1 C  SpO2: 98% 98%    Last Pain:  Vitals:   09/18/24 1048  TempSrc:   PainSc: 0-No pain                 Micca Matura C Zamariyah Furukawa      "

## 2024-09-18 NOTE — Telephone Encounter (Signed)
 Oral Oncology Patient Advocate Encounter  Prior Authorization for Truqap  has been approved.    PA# [id number] Effective dates: 09/29/2023 through 09/27/2025   Barbette (Patty) Chet Burnet, CPhT  [facility], [place], [place] Hematology/Oncology - Oral Chemotherapy Patient Advocate Specialist III Phone: [phone]  Fax: [phone]

## 2024-09-19 ENCOUNTER — Ambulatory Visit: Payer: Self-pay | Admitting: Oncology

## 2024-09-19 LAB — MICROALBUMIN / CREATININE URINE RATIO
Creatinine, Urine: 108.3 mg/dL
Microalb Creat Ratio: 31 mg/g{creat} — ABNORMAL HIGH (ref 0–29)
Microalb, Ur: 33.6 ug/mL — ABNORMAL HIGH

## 2024-09-20 NOTE — Progress Notes (Signed)
 Results faxed to [facility].

## 2024-10-03 ENCOUNTER — Other Ambulatory Visit (HOSPITAL_COMMUNITY): Payer: Self-pay

## 2024-10-03 ENCOUNTER — Other Ambulatory Visit: Payer: Self-pay

## 2024-10-03 DIAGNOSIS — C50919 Malignant neoplasm of unspecified site of unspecified female breast: Secondary | ICD-10-CM

## 2024-10-03 MED ORDER — CAPIVASERTIB 200 MG PO TABS
400.0000 mg | ORAL_TABLET | Freq: Two times a day (BID) | ORAL | 0 refills | Status: DC
  Filled 2024-10-03: qty 64, 16d supply, fill #0
  Filled 2024-10-04: qty 64, 28d supply, fill #0

## 2024-10-04 ENCOUNTER — Other Ambulatory Visit (HOSPITAL_COMMUNITY): Payer: Self-pay

## 2024-10-04 ENCOUNTER — Other Ambulatory Visit: Payer: Self-pay

## 2024-10-04 NOTE — Progress Notes (Signed)
 Specialty Pharmacy Refill Coordination Note  Amber Blevins is a 74 y.o. female contacted today regarding refills of specialty medication(s) Capivasertib  (TRUQAP )   Patient requested Delivery   Delivery date: 10/11/24   Verified address: [address]  [place]   Medication will be filled on: 10/10/24

## 2024-10-10 ENCOUNTER — Other Ambulatory Visit: Payer: Self-pay

## 2024-10-12 ENCOUNTER — Encounter: Payer: Self-pay | Admitting: Oncology

## 2024-10-12 ENCOUNTER — Inpatient Hospital Stay

## 2024-10-12 ENCOUNTER — Inpatient Hospital Stay: Attending: Oncology

## 2024-10-12 ENCOUNTER — Other Ambulatory Visit: Payer: Self-pay

## 2024-10-12 ENCOUNTER — Inpatient Hospital Stay: Attending: Oncology | Admitting: Oncology

## 2024-10-12 VITALS — BP 155/82 | HR 91 | Temp 97.3°F | Resp 18 | Wt 199.5 lb

## 2024-10-12 DIAGNOSIS — Z5111 Encounter for antineoplastic chemotherapy: Secondary | ICD-10-CM | POA: Diagnosis present

## 2024-10-12 DIAGNOSIS — C7951 Secondary malignant neoplasm of bone: Secondary | ICD-10-CM

## 2024-10-12 DIAGNOSIS — Z923 Personal history of irradiation: Secondary | ICD-10-CM | POA: Diagnosis not present

## 2024-10-12 DIAGNOSIS — Z170 Estrogen receptor positive status [ER+]: Secondary | ICD-10-CM | POA: Insufficient documentation

## 2024-10-12 DIAGNOSIS — G620 Drug-induced polyneuropathy: Secondary | ICD-10-CM

## 2024-10-12 DIAGNOSIS — C50919 Malignant neoplasm of unspecified site of unspecified female breast: Secondary | ICD-10-CM | POA: Diagnosis not present

## 2024-10-12 DIAGNOSIS — C50412 Malignant neoplasm of upper-outer quadrant of left female breast: Secondary | ICD-10-CM | POA: Insufficient documentation

## 2024-10-12 DIAGNOSIS — T451X5A Adverse effect of antineoplastic and immunosuppressive drugs, initial encounter: Secondary | ICD-10-CM | POA: Diagnosis not present

## 2024-10-12 DIAGNOSIS — C787 Secondary malignant neoplasm of liver and intrahepatic bile duct: Secondary | ICD-10-CM | POA: Diagnosis not present

## 2024-10-12 DIAGNOSIS — R739 Hyperglycemia, unspecified: Secondary | ICD-10-CM | POA: Diagnosis not present

## 2024-10-12 DIAGNOSIS — R7989 Other specified abnormal findings of blood chemistry: Secondary | ICD-10-CM

## 2024-10-12 LAB — CBC WITH DIFFERENTIAL (CANCER CENTER ONLY)
Abs Immature Granulocytes: 0.36 K/uL — ABNORMAL HIGH (ref 0.00–0.07)
Basophils Absolute: 0 K/uL (ref 0.0–0.1)
Basophils Relative: 0 %
Eosinophils Absolute: 0.3 K/uL (ref 0.0–0.5)
Eosinophils Relative: 4 %
HCT: 33 % — ABNORMAL LOW (ref 36.0–46.0)
Hemoglobin: 10.6 g/dL — ABNORMAL LOW (ref 12.0–15.0)
Immature Granulocytes: 4 %
Lymphocytes Relative: 14 %
Lymphs Abs: 1.3 K/uL (ref 0.7–4.0)
MCH: 28.1 pg (ref 26.0–34.0)
MCHC: 32.1 g/dL (ref 30.0–36.0)
MCV: 87.5 fL (ref 80.0–100.0)
Monocytes Absolute: 0.6 K/uL (ref 0.1–1.0)
Monocytes Relative: 7 %
Neutro Abs: 6.6 K/uL (ref 1.7–7.7)
Neutrophils Relative %: 71 %
Platelet Count: 265 K/uL (ref 150–400)
RBC: 3.77 MIL/uL — ABNORMAL LOW (ref 3.87–5.11)
RDW: 15.6 % — ABNORMAL HIGH (ref 11.5–15.5)
WBC Count: 9.1 K/uL (ref 4.0–10.5)
nRBC: 0.2 % (ref 0.0–0.2)

## 2024-10-12 LAB — CMP (CANCER CENTER ONLY)
ALT: 18 U/L (ref 0–44)
AST: 24 U/L (ref 15–41)
Albumin: 4.2 g/dL (ref 3.5–5.0)
Alkaline Phosphatase: 87 U/L (ref 38–126)
Anion gap: 11 (ref 5–15)
BUN: 19 mg/dL (ref 8–23)
CO2: 26 mmol/L (ref 22–32)
Calcium: 9.6 mg/dL (ref 8.9–10.3)
Chloride: 103 mmol/L (ref 98–111)
Creatinine: 1.31 mg/dL — ABNORMAL HIGH (ref 0.44–1.00)
GFR, Estimated: 43 mL/min — ABNORMAL LOW
Glucose, Bld: 214 mg/dL — ABNORMAL HIGH (ref 70–99)
Potassium: 4 mmol/L (ref 3.5–5.1)
Sodium: 140 mmol/L (ref 135–145)
Total Bilirubin: 0.6 mg/dL (ref 0.0–1.2)
Total Protein: 6.7 g/dL (ref 6.5–8.1)

## 2024-10-12 MED ORDER — ZOLEDRONIC ACID 4 MG/5ML IV CONC
3.0000 mg | Freq: Once | INTRAVENOUS | Status: AC
  Administered 2024-10-12: 3 mg via INTRAVENOUS
  Filled 2024-10-12: qty 3.75

## 2024-10-12 MED ORDER — FULVESTRANT 250 MG/5ML IM SOSY
500.0000 mg | PREFILLED_SYRINGE | Freq: Once | INTRAMUSCULAR | Status: AC
  Administered 2024-10-12: 500 mg via INTRAMUSCULAR

## 2024-10-12 NOTE — Patient Instructions (Signed)

## 2024-10-12 NOTE — Assessment & Plan Note (Signed)
 Chemotherapy plan as listed above

## 2024-10-12 NOTE — Progress Notes (Signed)
 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 Continue calcium  and vitamin D supplementation. 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    Zometa   3mg  every 3 months. Today and next due in April 2026   Primary malignant neoplasm of breast with metastasis (HCC) Metastatic Breast cancer with extensive thoracic and bone involvement, in visceral crisis, ER+, 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  Labs are reviewed and discussed with patient. Continue fulvestrant  and Capivasertib  [400mg  Q12h on D1-4 of each week].  fulvestrant  monthly.  She tolerates well except facial rash.      Chemotherapy-induced neuropathy #Grade 2 neuropathy, continue gabapentin ,  Continue gabapentin  600 mg in a.m.,night time  900 mg  Previously declined acupuncture    Elevated serum creatinine Encourage oral hydration and avoid nephrotoxins.  Could be side effects from Capivasertib  Discuss with nephrology. Weight benefits and risks, will continue Capivasertib  with close monitoring of kidney function.    Encounter for antineoplastic  chemotherapy Chemotherapy plan as listed above   Hyperglycemia Due to Capivasertib   Will check A1c at next visit.           Orders Placed This Encounter  Procedures   CBC with Differential (Cancer Center Only)    Standing Status:   Future    Expected Date:   11/09/2024    Expiration Date:   02/07/2025   CMP (Cancer Center only)    Standing Status:   Future    Expected Date:   11/09/2024    Expiration Date:   02/07/2025   Cancer antigen 27.29    Standing Status:   Future    Expected Date:   11/09/2024    Expiration Date:   02/07/2025   Cancer antigen 15-3    Standing Status:   Future    Expected Date:   11/09/2024    Expiration Date:   02/07/2025   Hemoglobin A1c    Standing Status:   Future    Expected Date:   11/09/2024    Expiration Date:   02/07/2025    Follow up 4 weeks lab MD  All questions were answered. The patient knows to call the clinic with any problems, questions or concerns.  Call Babara, MD, PhD [facility] Hematology Oncology 10/12/2024      HISTORY OF PRESENTING ILLNESS:   Amber Blevins is a  74 y.o.  female with PMH listed below presents for follow up for treatment of metastatic breast cancer.   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 Additional metastatic involvement T12, L1 through L3.  No primary tumor site identified. Top differential  considerations are Metastatic Disease Unknown primary, less likely Lymphoma or Multiple Myeloma.   2. Superimposed lumbar spine degeneration, including degenerative moderate to severe left L3 and L4 nerve level impingement from disc herniation.      07/02/2021 Imaging   CT chest abdomen pelvis showed innumerable small pulmonary and pleural nodules consistent with diffuse metastasis.  Associated with probable malignant pleural effusion.  Mediastinal and hilar lymphadenopathy consistent with metastatic disease.  Hepatic and bilateral adrenal gland metastasis.  Diffuse extensive destructive metastatic bone disease involving pelvis.   07/12/2021 Cancer Staging   Staging form: Breast, AJCC 8th Edition - Clinical stage from 07/12/2021: Stage IV (rcTX, cNX, pM1, ER+, PR+, HER2-) - Signed by Babara Call, MD on 03/08/2022 Stage prefix: Recurrence   07/17/2021 Initial Diagnosis   Metastatic breast cancer -history of breast cancer, diagnosed in 2010 Left-sided T2 (2.4cm) N0, ER/PR positive, HER2 negative IDC of the breast, s/p lumpectomy by Dr.Meyer at [facility]. Oncotype score of 11 who completed radiation and she finished 10 years of Femara  from 05/2009 and stopped in 2020.  -07/12/2021 patient underwent right thoracentesis.  Cytology was positive for metastatic carcinoma, compatible with breast origin.  ER/PR +, HER2 negative -07/16/2021, patient underwent iliac bone biopsy.  Positive for metastatic carcinoma.   07/21/2021 - 09/16/2021 Chemotherapy   Patient is on Treatment Plan : BREAST Paclitaxel  D1,8,15 q28d     09/04/2021 Imaging   CT chest abdomen pelvis showed improvement of the pulmonary nodularity and thickening in the left and the right lung.  Persistent nodular interstitial and pleural thickening remains.  Improvement of mediastinal and hilar adenopathy, hepatic metastatic lesions are similar.  Adrenal gland metastasis is similar.  Interval increase in the sclerotic bone metastasis.    09/30/2021 -   Chemotherapy   started on abemaciclib  100 mg twice daily and letrozole .   02/24/2022 Imaging    CT chest abdomen pelvis with contrast showed unchanged bilateral pleural effusion with associated intralobular septal thickening of the lung bases.  Without discrete nodularity consistent with stable appearance of treated pleural and lymphangitic metastatic disease.  Stable or minimally diminished subcentimeter liver lesions.  Unchanged left adrenal metastasis.  Unchanged widespread sclerotic osseous metastatic disease involving the included axial and appendicular skeleton.  No evidence of new metastatic disease in the chest abdomen or pelvis.  Coronary artery disease.   03/06/2022 Procedure   Ultrasound guided thoracentesis, removal of pleural fluid.    05/29/2022 Imaging   CT chest abdomen pelvis 1. Stable CT of the chest, abdomen and pelvis. 2. Unchanged appearance of bilateral pleural effusions, pleural nodularity, and interlobular septal thickening compatible with pleural and lymphangitic metastatic disease. 3. Stable appearance of multifocal sclerotic bone metastases. 4. Multifocal low-attenuation liver lesions are unchanged in the interval. 5. Stable appearance of left adrenal gland metastases. 6. Stable appearance of small pericardial effusion.7. No new or progressive disease identified.8. Aortic Atherosclerosis (ICD10-I70.0).   06/06/2022 Imaging   Bone scan Constellation of findings are consistent with multifocal osseous metastatic disease throughout the axial and appendicular skeleton.Of note, osseous metastatic disease  involves the RIGHT greater than LEFT femur. Consider evaluation with dedicated hip and femur radiographs to evaluate for overall disease burden and potential risk for pathologic fracture.   08/31/2022 Imaging   CT chest abdomen pelvis w contrast 1. Unchanged small right, trace left pleural effusions with associated diffuse interlobular septal thickening and fine fissural and  perilymphatic nodularity throughout the lungs, findings consistent with lymphangitic metastatic disease. 2. Stable or slightly diminished very subtle hypodense lesion of the central right lobe of the liver, hepatic segment VIII measuring 0.6 cm, previously 0.8 cm. Additional unchanged, very subtle hypodense lesion of hepatic segment IV B measuring 1.1 cm. 3. Unchanged widespread sclerotic osseous metastatic disease throughout the included axial and proximal appendicular skeleton, particularly dense in the lower lumbar spine and sacrum. 4. Unchanged left adrenal nodule measuring 1.7 x 1.1 cm. 5. Overall constellation of findings is consistent with stable metastatic disease. 6. Coronary artery disease.    12/01/2022 Miscellaneous   [facility] liquid biopsy - PIK3CA mutation. 0.1% Not enough tissue for tissue NGS   03/08/2023 Imaging   Bone scan showed Multifocal osseous metastatic disease. One new focus along the right side of the skull   08/23/2023 - 09/06/2023 Radiation Therapy   Skull radiation.    12/24/2023 Imaging   CT chest abdomen pelvis wo contrast showed 1. 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 2. New indistinct 0.6 cm medial right lower lobe pulmonary nodule, indeterminate for inflammatory nodule versus pulmonary metastasis. Suggest attention on follow-up chest CT in 3 months. 3. Widespread patchy sclerotic osseous metastatic disease, not appreciably changed. 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 4. Stable small layering right pleural effusion. Stable small pericardial effusion. 5. Three-vessel coronary atherosclerosis. 6. Long-term stability of left adrenal nodule favoring an adenoma. 7. Moderate left colonic diverticulosis. 8.  Aortic Atherosclerosis (ICD10-I70.0).     Procedure   Liver biopsy Pathology showed metasatic adenocarcinoma Immunohistochemical stains show that the tumor cells are positive for CK7 and GATA3  while they are negative for CK20 and CDX2, consistent with patient's clinical history of primary breast carcinoma.     ER 95%, PR 0%, HER2 low (1+), Ki 67 5% NGS showed PIK3CA mutation, CHEK mutation.    02/07/2024 -  Chemotherapy   Started on Capivasertib  400mg  Q12h on D1-4 of each week   05/18/2024 Imaging   CT chest abdomen pelvis w contrast   1. Interval decrease in size of multiple hypodense liver metastases consistent with treatment response. 2. Unchanged widespread sclerotic osseous metastatic disease, notable for chronic wedge deformities of T7 and T10 as well as very extensive involvement of the sacrum. 3. Trace right pleural effusion, slightly diminished in volume. 4. Emphysema. 5. Coronary artery disease.   Aortic Atherosclerosis (ICD10-I70.0) and Emphysema (ICD10-J43.9).   Metastasis to bone (HCC)  02/04/2022 Initial Diagnosis   Metastasis to bone [facility])    Patient follows up with cardiology for evaluation of bradycardia.  Cardiology notes reviewed.  72-hour Holter monitor revealed revealed predominant sinus rhythm with mean heart rate of 96 bpm, heart rate range 53 to 129 bpm, frequent premature ventricular contractions (27% burden), and intermittent atrial fibrillation (5% burden) with longest episode 3 hours. CHA2DS2-VASc score of 3, per cardiology recommendation patient was started on Eliquis 5 mg twice daily.     INTERVAL HISTORY Amber Blevins is a 74 y.o. female who has above history reviewed by me today presents for follow up visit for metastatic breast cancer  Discussed the use of AI scribe  software for clinical note transcription with the patient, who gave verbal consent to proceed.   No facial rash during the interval Denies abdominal pain,nausea or vomiting. Occasional diarrhea.  Since May 2025, she has been on Capivasertib  400mg  Q12h on D1-4 of each week.    .  Review of Systems  Constitutional:  Negative for appetite change, chills, fatigue and  fever.  HENT:   Negative for hearing loss and voice change.   Eyes:  Negative for eye problems.  Respiratory:  Negative for chest tightness, cough and shortness of breath.   Cardiovascular:  Negative for chest pain.  Gastrointestinal:  Negative for abdominal distention, abdominal pain and blood in stool.  Endocrine: Negative for hot flashes.  Genitourinary:  Negative for difficulty urinating and frequency.   Musculoskeletal:  Negative for arthralgias.  Skin:  Negative for itching and rash.  Neurological:  Positive for numbness. Negative for extremity weakness.  Hematological:  Negative for adenopathy.  Psychiatric/Behavioral:  Negative for confusion.      MEDICAL HISTORY:  Past Medical History:  Diagnosis Date   A-fib (HCC)    Asthma    Breast cancer (HCC)    Chemotherapy-induced neuropathy    COPD (chronic obstructive pulmonary disease) (HCC)    Dyspnea    Family history of adverse reaction to anesthesia    brother has problem with coming out of anesthesia   Grade I diastolic dysfunction    High cholesterol    Hypertension    Personal history of radiation therapy    Pre-diabetes    S/P left mastectomy    Stage 3a chronic kidney disease (CKD) (HCC)     SURGICAL HISTORY: Past Surgical History:  Procedure Laterality Date   BREAST LUMPECTOMY Left    2010   CATARACT EXTRACTION W/PHACO Right 09/18/2024   Procedure: PHACOEMULSIFICATION, CATARACT, WITH IOL INSERTION 9.49 00:49.0;  Surgeon: Myrna Adine Anes, MD;  Location[facility];  Service: Ophthalmology;  Laterality: Right;   COLONOSCOPY     EYE SURGERY Left    Cataract surgery   PORTACATH PLACEMENT N/A 08/01/2021   Procedure: INSERTION PORT-A-CATH;  Surgeon: Rodolph Romano, MD;  Location: [facility];  Service: General;  Laterality: N/A;   thorocentesis Right 07/10/2021   and another on 07/25/21    SOCIAL HISTORY: Social History   Socioeconomic History   Marital status: Married    Spouse name: Not  on file   Number of children: Not on file   Years of education: Not on file   Highest education level: Not on file  Occupational History   Not on file  Tobacco Use   Smoking status: Former    Current packs/day: 0.00    Average packs/day: 1 pack/day for 25.0 years (25.0 ttl pk-yrs)    Types: Cigarettes    Start date: 69    Quit date: 40    Years since quitting: 36.0   Smokeless tobacco: Never  Vaping Use   Vaping status: Never Used  Substance and Sexual Activity   Alcohol use: Not Currently   Drug use: Never   Sexual activity: Not on file  Other Topics Concern   Not on file  Social History Narrative   Not on file   Social Drivers of Health   Tobacco Use: Medium Risk (10/12/2024)   Patient History    Smoking Tobacco Use: Former    Smokeless Tobacco Use: Never    Passive Exposure: Not on file  Financial Resource Strain: Low Risk  (07/26/2024)   Received  from [facility]   Overall Financial Resource Strain (CARDIA)    Difficulty of Paying Living Expenses: Not hard at all  Food Insecurity: No Food Insecurity (07/26/2024)   Received from [facility]   Epic    Within the past 12 months, you worried that your food would run out before you got the money to buy more.: Never true    Within the past 12 months, the food you bought just didn't last and you didn't have money to get more.: Never true  Transportation Needs: No Transportation Needs (07/26/2024)   Received from [facility] - Transportation    In the past 12 months, has lack of transportation kept you from medical appointments or from getting medications?: No    Lack of Transportation (Non-Medical): No  Physical Activity: Not on file  Stress: Not on file  Social Connections: Not on file  Intimate Partner Violence: Not on file  Depression (PHQ2-9): Low Risk (07/20/2024)   Depression (PHQ2-9)    PHQ-2 Score: 0  Alcohol Screen: Not on file  Housing:  Low Risk  (07/26/2024)   Received from [facility]   Epic    In the last 12 months, was there a time when you were not able to pay the mortgage or rent on time?: No    In the past 12 months, how many times have you moved where you were living?: 0    At any time in the past 12 months, were you homeless or living in a shelter (including now)?: No  Utilities: Not At Risk (07/26/2024)   Received from [facility]   Epic    In the past 12 months has the electric, gas, oil, or water company threatened to shut off services in your home?: No  Health Literacy: Not on file    FAMILY HISTORY: Family History  Problem Relation Age of Onset   Ovarian cancer Mother 66   Lung cancer Mother 41   Diabetes Father    Heart disease Father    Parkinson's disease Father    Bladder Cancer Brother 19   Pulmonary disease Brother    Rheumatic fever Brother    Prostate cancer Cousin 55   Prostate cancer Cousin 54   Breast cancer Neg Hx     ALLERGIES:  is allergic to green tea (camellia sinensis), tea tree oil, and lisinopril.  MEDICATIONS:  Current Outpatient Medications  Medication Sig Dispense Refill   acetaminophen  (TYLENOL ) 650 MG CR tablet Take 650 mg by mouth every 8 (eight) hours as needed for pain.     apixaban (ELIQUIS) 5 MG TABS tablet Take 5 mg by mouth 2 (two) times daily.     atorvastatin  (LIPITOR) 40 MG tablet Take 40 mg by mouth at bedtime.     Boswellia-Glucosamine-Vit D (OSTEO BI-FLEX-GLUCOS/5-LOXIN) TABS Take 1 capsule by mouth in the morning and at bedtime.     Calcium  Carb-Cholecalciferol 600-400 MG-UNIT TABS Take 1 tablet by mouth 2 (two) times daily.     capivasertib  (TRUQAP ) 200 MG tablet Take 2 tablets (400 mg total) by mouth 2 (two) times daily. Take for 4 days, then hold for 3 days. Repeat every 7 days. 64 tablet 0   cetirizine (ZYRTEC) 10 MG chewable tablet Chew 10 mg by mouth daily as needed for allergies.     clobetasol cream (TEMOVATE)  0.05 % Apply 1 Application topically as needed.     cyanocobalamin 1000  MCG tablet Take 1,000 mcg by mouth daily.     diphenoxylate -atropine  (LOMOTIL ) 2.5-0.025 MG tablet Take 1 tablet by mouth 4 (four) times daily as needed for diarrhea or loose stools. 30 tablet 0   ELIQUIS 5 MG TABS tablet Take 5 mg by mouth 2 (two) times daily.     fulvestrant  (FASLODEX ) 250 MG/5ML injection Inject 250 mg into the muscle every 30 (thirty) days. One injection each buttock over 1-2 minutes. Warm prior to use.     gabapentin  (NEURONTIN ) 300 MG capsule TAKE 2 CAPSULES BY MOUTH IN THE MORNING AND 3 CAPSULES IN THE EVENING 450 capsule 0   Iron-Vitamin C (VITRON-C) 65-125 MG TABS Take 1 tablet by mouth daily.     lidocaine -prilocaine  (EMLA ) cream Apply small amount to port and cover with saran wrap 1-2 hours prior to port access 30 g 5   Loperamide HCl (IMODIUM PO) Take by mouth.     losartan (COZAAR) 50 MG tablet Take 50 mg by mouth daily.     magnesium chloride (SLOW-MAG) 64 MG TBEC SR tablet Take 1 tablet by mouth at bedtime.     melatonin (MELATONIN MAXIMUM STRENGTH) 5 MG TABS Take 1 tablet by mouth at bedtime as needed.     nystatin cream (MYCOSTATIN) Apply 1 Application topically as needed for dry skin.     ondansetron  (ZOFRAN ) 8 MG tablet Take 1 tablet (8 mg total) by mouth every 8 (eight) hours as needed for nausea or vomiting. 20 tablet 1   polyethylene glycol (MIRALAX / GLYCOLAX) 17 g packet Take 17 g by mouth daily as needed for mild constipation.     Turmeric (QC TUMERIC COMPLEX PO) Take 1 capsule by mouth at bedtime.     Zoledronic  Acid (ZOMETA  IV) Inject into the vein every 3 (three) months.     Docusate Sodium (DSS) 100 MG CAPS Take 1 capsule by mouth daily. (Patient not taking: Reported on 10/12/2024)     No current facility-administered medications for this visit.   Facility-Administered Medications Ordered in Other Visits  Medication Dose Route Frequency Provider Last Rate Last Admin    fulvestrant  (FASLODEX ) injection 500 mg  500 mg Intramuscular Once Babara Call, MD       sodium chloride  flush (NS) 0.9 % injection 10 mL  10 mL Intravenous Once Babara Call, MD         PHYSICAL EXAMINATION: ECOG PERFORMANCE STATUS: 1 - Symptomatic but completely ambulatory Vitals:   10/12/24 0905 10/12/24 0917  BP: (!) 157/84 (!) 155/82  Pulse: 91   Resp: 18   Temp: (!) 97.3 F (36.3 C)   SpO2: 99%     Filed Weights   10/12/24 0905  Weight: 199 lb 8 oz (90.5 kg)     Physical Exam Constitutional:      General: She is not in acute distress.    Appearance: She is not diaphoretic.  HENT:     Head: Normocephalic and atraumatic.     Nose: Nose normal.     Mouth/Throat:     Pharynx: No oropharyngeal exudate.  Eyes:     General: No scleral icterus.    Pupils: Pupils are equal, round, and reactive to light.  Cardiovascular:     Rate and Rhythm: Normal rate and regular rhythm.     Heart sounds: No murmur heard. Pulmonary:     Effort: Pulmonary effort is normal. No respiratory distress.     Comments: Mild decreased breath sound bilaterally.. L>R Abdominal:  General: There is no distension.     Palpations: Abdomen is soft.     Tenderness: There is no abdominal tenderness.  Musculoskeletal:        General: Normal range of motion.     Cervical back: Normal range of motion and neck supple.  Skin:    General: Skin is warm and dry.     Findings: No erythema.  Neurological:     Mental Status: She is alert and oriented to person, place, and time.     Cranial Nerves: No cranial nerve deficit.     Motor: No abnormal muscle tone.     Coordination: Coordination normal.  Psychiatric:        Mood and Affect: Affect normal.       LABORATORY DATA:  I have reviewed the data as listed     Latest Ref Rng & Units 10/12/2024    8:55 AM 09/14/2024    8:41 AM 08/17/2024    8:10 AM  CBC  WBC 4.0 - 10.5 K/uL 9.1  7.8  8.7   Hemoglobin 12.0 - 15.0 g/dL 89.3  89.5  89.6   Hematocrit  36.0 - 46.0 % 33.0  32.4  32.5   Platelets 150 - 400 K/uL 265  248  243       Latest Ref Rng & Units 10/12/2024    8:55 AM 09/14/2024    8:41 AM 08/17/2024    8:10 AM  CMP  Glucose 70 - 99 mg/dL 785  689  863   BUN 8 - 23 mg/dL 19  20  17    Creatinine 0.44 - 1.00 mg/dL 8.68  8.68  8.59   Sodium 135 - 145 mmol/L 140  139  139   Potassium 3.5 - 5.1 mmol/L 4.0  4.1  3.8   Chloride 98 - 111 mmol/L 103  102  105   CO2 22 - 32 mmol/L 26  26  25    Calcium  8.9 - 10.3 mg/dL 9.6  9.4  9.1   Total Protein 6.5 - 8.1 g/dL 6.7  6.6  6.8   Total Bilirubin 0.0 - 1.2 mg/dL 0.6  0.6  0.7   Alkaline Phos 38 - 126 U/L 87  83  67   AST 15 - 41 U/L 24  20  22    ALT 0 - 44 U/L 18  16  18       RADIOGRAPHIC STUDIES: I have personally reviewed the radiological images as listed and agreed with the findings in the report. US  RENAL Result Date: 08/22/2024 EXAM: US  Retroperitoneum Complete, Renal. 08/17/2024 10:33:36 AM TECHNIQUE: Real-time ultrasonography of the retroperitoneum renal was performed. COMPARISON: CT dated 08/10/2024. CLINICAL HISTORY: Chronic kidney disease 3a. FINDINGS: FINDINGS: RIGHT KIDNEY/URETER: Right kidney measures 9.2 x 4.6 x 4.6 cm. Calculated volume is 102 ml. Normal cortical echogenicity. No hydronephrosis. No mass. LEFT KIDNEY/URETER: Left kidney measures 10.2 x 5.3 x 4.0 cm. Calculated volume is 113 ml. A left renal pelvic cyst is noted, stable from prior CT, measuring up to 2.2 cm. Normal cortical echogenicity. No hydronephrosis. BLADDER: Pre-void bladder volume is 185 mL. Post-void bladder volume is 17 mL. IMPRESSION: 1. Stable left renal pelvic cyst measuring up to 2.2 cm, unchanged from prior, no follow-up imaging required for this benign finding. Electronically signed by: Oneil Devonshire MD 08/22/2024 09:36 PM EST RP Workstation: [id number]   MR Brain W Wo Contrast Result Date: 08/13/2024 EXAM: MRI BRAIN WITH AND WITHOUT CONTRAST 08/10/2024 10:16:00 AM TECHNIQUE: Multiplanar  multisequence MRI  of the head/brain was performed with and without the administration of intravenous contrast. CONTRAST: 8 mL of Gadavist . COMPARISON: MR Head 01/21/2024. CLINICAL HISTORY: Metastatic breast cancer, follow up evaluation. FINDINGS: BRAIN AND VENTRICLES: No acute infarct. No acute intracranial hemorrhage. No mass effect or midline shift. No hydrocephalus. The sella is unremarkable. Normal flow voids. No contrast enhancing lesions within the brain parenchyma. Left ocular lens replacement. ORBITS: Left ocular lens replacement. No acute abnormality. SINUSES: No acute abnormality. BONES AND SOFT TISSUES: Unchanged appearance of the calvarial lesion along the right lateral aspect of the coronal suture measuring 21 mm in AP length. No new enhancing calvarial lesions. Normal bone marrow signal and enhancement. No acute soft tissue abnormality. IMPRESSION: 1. No intracranial metastatic disease identified. 2. 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 No new enhancing calvarial lesions. 3. No acute intracranial abnormality. Electronically signed by: Franky Stanford MD 08/13/2024 01:06 PM EST RP Workstation: [id number]   CT CHEST ABDOMEN PELVIS WO CONTRAST Result Date: 08/12/2024 CLINICAL DATA:  Metastatic breast cancer. Restaging. * Tracking Code: BO * EXAM: CT CHEST, ABDOMEN AND PELVIS WITHOUT CONTRAST TECHNIQUE: Multidetector CT imaging of the chest, abdomen and pelvis was performed following the standard protocol without IV contrast. RADIATION DOSE REDUCTION: This exam was performed according to the departmental dose-optimization program which includes automated exposure control, adjustment of the mA and/or kV according to patient size and/or use of iterative reconstruction technique. COMPARISON:  05/18/2024 FINDINGS: CT CHEST FINDINGS Cardiovascular: The heart size is normal. No substantial pericardial effusion. Coronary artery calcification is evident. Mild atherosclerotic calcification is  noted in the wall of the thoracic aorta. Right Port-A-Cath tip is at the SVC/RA junction. Mediastinum/Nodes: No mediastinal lymphadenopathy. No evidence for gross hilar lymphadenopathy although assessment is limited by the lack of intravenous contrast on the current study. The esophagus has normal imaging features. There is no axillary lymphadenopathy. Lungs/Pleura: Subtle changes of centrilobular emphysema. Subtle irregularity of the major fissures is unchanged. No new suspicious pulmonary nodule or mass. Trace right pleural effusion evident but decreased since prior. Musculoskeletal: Similar appearance of sclerotic bone lesions. CT ABDOMEN PELVIS FINDINGS Hepatobiliary: Subtle mineralized liver lesions are stable (see anterior liver on 56/2), compatible with treated metastases. The 10 mm low-density lesion seen anterior right liver previously is not evident on the current study. There is no evidence for gallstones, gallbladder wall thickening, or pericholecystic fluid. No intrahepatic or extrahepatic biliary dilation. Pancreas: No focal mass lesion. No dilatation of the main duct. No intraparenchymal cyst. No peripancreatic edema. Spleen: No splenomegaly. No suspicious focal mass lesion. Adrenals/Urinary Tract: Stable 17 mm left adrenal nodule. Right adrenal gland unremarkable. Right kidney unremarkable. Central sinus cysts again noted left kidney. No hydroureter. The urinary bladder appears normal for the degree of distention. Stomach/Bowel: Stomach is unremarkable. No gastric wall thickening. No evidence of outlet obstruction. Duodenum is normally positioned as is the ligament of Treitz. No small bowel wall thickening. No small bowel dilatation. The terminal ileum is normal. The appendix is not well visualized, but there is no edema or inflammation in the region of the cecal tip to suggest appendicitis. No gross colonic mass. No colonic wall thickening. Diverticular changes are noted in the left colon without  evidence of diverticulitis. Vascular/Lymphatic: There is moderate atherosclerotic calcification of the abdominal aorta without aneurysm. There is no gastrohepatic or hepatoduodenal ligament lymphadenopathy. No retroperitoneal or mesenteric lymphadenopathy. No pelvic sidewall lymphadenopathy. Reproductive: Unremarkable. Other: No intraperitoneal free fluid. Musculoskeletal: Pelvic floor laxity evident. Multiple sclerotic bone lesions again  identified with diffuse involvement of the sacrum, similar to prior. IMPRESSION: 1. Stable exam. No new or progressive findings to suggest new or progressive metastatic disease in the chest, abdomen, or pelvis. 2. Stable appearance of sclerotic bone lesions. 3. Stable 17 mm left adrenal nodule compatible with benign adrenal adenoma. No followup imaging is recommended. 4. Trace right pleural effusion, decreased since prior. 5. Aortic Atherosclerosis (ICD10-I70.0) and Emphysema (ICD10-J43.9). Electronically Signed   By: Camellia Candle M.D.   On: 08/12/2024 08:03     "

## 2024-10-12 NOTE — Assessment & Plan Note (Addendum)
 Encourage oral hydration and avoid nephrotoxins.  Could be side effects from Capivasertib  Discuss with nephrology. Weight benefits and risks, will continue Capivasertib  with close monitoring of kidney function.

## 2024-10-12 NOTE — Assessment & Plan Note (Signed)
#  Grade 2 neuropathy, continue gabapentin ,  Continue gabapentin  600 mg in a.m.,night time  900 mg  Previously declined acupuncture

## 2024-10-12 NOTE — Assessment & Plan Note (Addendum)
 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 Continue calcium  and vitamin D supplementation. 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    Zometa   3mg  every 3 months. Today and next due in April 2026

## 2024-10-12 NOTE — Assessment & Plan Note (Signed)
 Due to Capivasertib   Will check A1c at next visit.

## 2024-10-12 NOTE — Assessment & Plan Note (Addendum)
 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  Labs are reviewed and discussed with patient. Continue fulvestrant  and Capivasertib  [400mg  Q12h on D1-4 of each week].  fulvestrant  monthly.  She tolerates well except facial rash.

## 2024-10-13 ENCOUNTER — Other Ambulatory Visit: Payer: Self-pay

## 2024-10-13 LAB — CANCER ANTIGEN 15-3: CA 15-3: 12.1 U/mL (ref 0.0–25.0)

## 2024-10-13 LAB — CANCER ANTIGEN 27.29: CA 27.29: 15.8 U/mL (ref 0.0–38.6)

## 2024-10-13 NOTE — Progress Notes (Signed)
 Specialty Pharmacy Ongoing Clinical Assessment Note  Amber Blevins is a 74 y.o. female who is being followed by the specialty pharmacy service for RxSp Oncology   Patient's specialty medication(s) reviewed today: Capivasertib  (TRUQAP )   Missed doses in the last 4 weeks: 0   Patient/Caregiver did not have any additional questions or concerns.   Therapeutic benefit summary: Patient is achieving benefit   Adverse events/side effects summary: No adverse events/side effects   Patient's therapy is appropriate to: Continue    Goals Addressed             This Visit's Progress    Slow Disease Progression   On track    Patient is on track. Patient will maintain adherence. Per OV 10/13/24, labs are stable.           Follow up: 3 months  [facility]

## 2024-10-31 ENCOUNTER — Other Ambulatory Visit: Payer: Self-pay | Admitting: Oncology

## 2024-10-31 ENCOUNTER — Other Ambulatory Visit: Payer: Self-pay

## 2024-10-31 DIAGNOSIS — C50919 Malignant neoplasm of unspecified site of unspecified female breast: Secondary | ICD-10-CM

## 2024-11-01 ENCOUNTER — Other Ambulatory Visit: Payer: Self-pay

## 2024-11-01 MED ORDER — CAPIVASERTIB 200 MG PO TABS
400.0000 mg | ORAL_TABLET | Freq: Two times a day (BID) | ORAL | 0 refills | Status: AC
  Filled 2024-11-01: qty 64, 16d supply, fill #0
  Filled 2024-11-01: qty 64, 28d supply, fill #0

## 2024-11-01 NOTE — Progress Notes (Signed)
 Specialty Pharmacy Refill Coordination Note  Amber Blevins is a 74 y.o. female contacted today regarding refills of specialty medication(s) Capivasertib  (TRUQAP )   Patient requested Delivery   Delivery date: 11/10/24   Verified address: [address]  [place]   Medication will be filled on: 11/09/24

## 2024-11-09 ENCOUNTER — Inpatient Hospital Stay: Admitting: Oncology

## 2024-11-09 ENCOUNTER — Inpatient Hospital Stay
